# Patient Record
Sex: Female | Born: 1955
Health system: Southern US, Community
[De-identification: ages and names within clinical notes are randomized; demographics above are authoritative.]

## PROBLEM LIST (undated history)

## (undated) ENCOUNTER — Ambulatory Visit (HOSPITAL_BASED_OUTPATIENT_CLINIC_OR_DEPARTMENT_OTHER)

## (undated) DIAGNOSIS — T451X5A Adverse effect of antineoplastic and immunosuppressive drugs, initial encounter: Secondary | ICD-10-CM

## (undated) DIAGNOSIS — I639 Cerebral infarction, unspecified: Secondary | ICD-10-CM

## (undated) DIAGNOSIS — K759 Inflammatory liver disease, unspecified: Secondary | ICD-10-CM

## (undated) DIAGNOSIS — J45909 Unspecified asthma, uncomplicated: Secondary | ICD-10-CM

## (undated) DIAGNOSIS — Z87891 Personal history of nicotine dependence: Secondary | ICD-10-CM

## (undated) DIAGNOSIS — M1990 Unspecified osteoarthritis, unspecified site: Secondary | ICD-10-CM

## (undated) DIAGNOSIS — H3530 Unspecified macular degeneration: Secondary | ICD-10-CM

## (undated) DIAGNOSIS — G500 Trigeminal neuralgia: Secondary | ICD-10-CM

## (undated) DIAGNOSIS — M810 Age-related osteoporosis without current pathological fracture: Secondary | ICD-10-CM

## (undated) DIAGNOSIS — Z860100 Personal history of colon polyps, unspecified: Secondary | ICD-10-CM

## (undated) DIAGNOSIS — Z800 Family history of malignant neoplasm of digestive organs: Secondary | ICD-10-CM

## (undated) DIAGNOSIS — IMO0001 Reserved for inherently not codable concepts without codable children: Secondary | ICD-10-CM

## (undated) DIAGNOSIS — Z5189 Encounter for other specified aftercare: Secondary | ICD-10-CM

## (undated) DIAGNOSIS — Z85118 Personal history of other malignant neoplasm of bronchus and lung: Secondary | ICD-10-CM

## (undated) DIAGNOSIS — E559 Vitamin D deficiency, unspecified: Secondary | ICD-10-CM

## (undated) DIAGNOSIS — H269 Unspecified cataract: Secondary | ICD-10-CM

## (undated) DIAGNOSIS — C7801 Secondary malignant neoplasm of right lung: Secondary | ICD-10-CM

## (undated) DIAGNOSIS — J439 Emphysema, unspecified: Secondary | ICD-10-CM

## (undated) DIAGNOSIS — I739 Peripheral vascular disease, unspecified: Secondary | ICD-10-CM

## (undated) DIAGNOSIS — R1310 Dysphagia, unspecified: Secondary | ICD-10-CM

## (undated) DIAGNOSIS — J449 Chronic obstructive pulmonary disease, unspecified: Secondary | ICD-10-CM

## (undated) DIAGNOSIS — D649 Anemia, unspecified: Secondary | ICD-10-CM

## (undated) DIAGNOSIS — E785 Hyperlipidemia, unspecified: Secondary | ICD-10-CM

## (undated) DIAGNOSIS — K449 Diaphragmatic hernia without obstruction or gangrene: Secondary | ICD-10-CM

## (undated) DIAGNOSIS — T7840XA Allergy, unspecified, initial encounter: Secondary | ICD-10-CM

## (undated) DIAGNOSIS — J189 Pneumonia, unspecified organism: Secondary | ICD-10-CM

## (undated) DIAGNOSIS — K219 Gastro-esophageal reflux disease without esophagitis: Secondary | ICD-10-CM

## (undated) DIAGNOSIS — I10 Essential (primary) hypertension: Secondary | ICD-10-CM

## (undated) DIAGNOSIS — D61810 Antineoplastic chemotherapy induced pancytopenia: Secondary | ICD-10-CM

## (undated) DIAGNOSIS — K648 Other hemorrhoids: Secondary | ICD-10-CM

## (undated) HISTORY — DX: Personal history of colon polyps, unspecified: Z86.0100

## (undated) HISTORY — PX: ELBOW SURGERY: SHX618

## (undated) HISTORY — DX: Diaphragmatic hernia without obstruction or gangrene: K44.9

## (undated) HISTORY — DX: Emphysema, unspecified: J43.9

## (undated) HISTORY — DX: Cerebral infarction, unspecified: I63.9

## (undated) HISTORY — DX: Trigeminal neuralgia: G50.0

## (undated) HISTORY — DX: Other hemorrhoids: K64.8

## (undated) HISTORY — PX: APPENDECTOMY: SHX54

## (undated) HISTORY — PX: LUMBAR DISC SURGERY: SHX700

## (undated) HISTORY — DX: Age-related osteoporosis without current pathological fracture: M81.0

## (undated) HISTORY — DX: Gastro-esophageal reflux disease without esophagitis: K21.9

## (undated) HISTORY — PX: COLONOSCOPY: SHX174

## (undated) HISTORY — DX: Chronic obstructive pulmonary disease, unspecified: J44.9

## (undated) HISTORY — DX: Antineoplastic chemotherapy induced pancytopenia: D61.810

## (undated) HISTORY — DX: Dysphagia, unspecified: R13.10

## (undated) HISTORY — DX: Unspecified cataract: H26.9

## (undated) HISTORY — PX: OTHER SURGICAL HISTORY: SHX169

## (undated) HISTORY — PX: CRANIOTOMY: SHX93

## (undated) HISTORY — DX: Encounter for other specified aftercare: Z51.89

## (undated) HISTORY — DX: Unspecified asthma, uncomplicated: J45.909

## (undated) HISTORY — PX: CATARACT EXTRACTION, BILATERAL: SHX1313

## (undated) HISTORY — DX: Unspecified osteoarthritis, unspecified site: M19.90

## (undated) HISTORY — DX: Vitamin D deficiency, unspecified: E55.9

## (undated) HISTORY — DX: Anemia, unspecified: D64.9

## (undated) HISTORY — DX: Personal history of colonic polyps: Z86.010

## (undated) HISTORY — DX: Personal history of other malignant neoplasm of bronchus and lung: Z85.118

## (undated) HISTORY — DX: Antineoplastic chemotherapy induced pancytopenia: T45.1X5A

## (undated) HISTORY — DX: Peripheral vascular disease, unspecified: I73.9

## (undated) HISTORY — DX: Personal history of nicotine dependence: Z87.891

## (undated) HISTORY — DX: Secondary malignant neoplasm of right lung: C78.01

## (undated) HISTORY — DX: Hyperlipidemia, unspecified: E78.5

## (undated) HISTORY — PX: UPPER GASTROINTESTINAL ENDOSCOPY: SHX188

## (undated) HISTORY — PX: ABDOMINAL HYSTERECTOMY: SHX81

## (undated) HISTORY — PX: HAND SURGERY: SHX662

## (undated) HISTORY — DX: Family history of malignant neoplasm of digestive organs: Z80.0

## (undated) HISTORY — PX: WRIST SURGERY: SHX841

## (undated) HISTORY — DX: Allergy, unspecified, initial encounter: T78.40XA

## (undated) HISTORY — DX: Essential (primary) hypertension: I10

## (undated) HISTORY — PX: TOTAL HIP ARTHROPLASTY: SHX124

---

## 1998-05-27 ENCOUNTER — Emergency Department (HOSPITAL_COMMUNITY): Admission: EM | Admit: 1998-05-27 | Discharge: 1998-05-27 | Payer: Self-pay | Admitting: Emergency Medicine

## 1998-11-17 ENCOUNTER — Ambulatory Visit (HOSPITAL_COMMUNITY): Admission: RE | Admit: 1998-11-17 | Discharge: 1998-11-17 | Payer: Self-pay | Admitting: Orthopedic Surgery

## 1998-11-17 ENCOUNTER — Encounter: Payer: Self-pay | Admitting: Orthopedic Surgery

## 2000-02-11 ENCOUNTER — Ambulatory Visit (HOSPITAL_COMMUNITY): Admission: RE | Admit: 2000-02-11 | Discharge: 2000-02-11 | Payer: Self-pay | Admitting: Internal Medicine

## 2000-02-11 ENCOUNTER — Encounter: Payer: Self-pay | Admitting: Internal Medicine

## 2001-08-01 ENCOUNTER — Ambulatory Visit (HOSPITAL_COMMUNITY): Admission: RE | Admit: 2001-08-01 | Discharge: 2001-08-01 | Payer: Self-pay | Admitting: Neurosurgery

## 2001-08-01 ENCOUNTER — Encounter: Payer: Self-pay | Admitting: Neurosurgery

## 2003-11-28 ENCOUNTER — Ambulatory Visit (HOSPITAL_COMMUNITY): Admission: RE | Admit: 2003-11-28 | Discharge: 2003-11-28 | Payer: Self-pay | Admitting: Orthopedic Surgery

## 2004-01-15 ENCOUNTER — Ambulatory Visit (HOSPITAL_COMMUNITY): Admission: RE | Admit: 2004-01-15 | Discharge: 2004-01-15 | Payer: Self-pay | Admitting: Orthopedic Surgery

## 2004-04-03 ENCOUNTER — Encounter: Admission: RE | Admit: 2004-04-03 | Discharge: 2004-04-03 | Payer: Self-pay | Admitting: Internal Medicine

## 2005-06-30 ENCOUNTER — Ambulatory Visit (HOSPITAL_COMMUNITY): Admission: RE | Admit: 2005-06-30 | Discharge: 2005-06-30 | Payer: Self-pay | Admitting: Orthopedic Surgery

## 2005-12-15 ENCOUNTER — Inpatient Hospital Stay (HOSPITAL_COMMUNITY): Admission: RE | Admit: 2005-12-15 | Discharge: 2005-12-19 | Payer: Self-pay | Admitting: Orthopedic Surgery

## 2006-05-11 ENCOUNTER — Ambulatory Visit (HOSPITAL_COMMUNITY): Admission: RE | Admit: 2006-05-11 | Discharge: 2006-05-11 | Payer: Self-pay | Admitting: Internal Medicine

## 2007-12-05 ENCOUNTER — Inpatient Hospital Stay (HOSPITAL_COMMUNITY): Admission: EM | Admit: 2007-12-05 | Discharge: 2007-12-08 | Payer: Self-pay | Admitting: Emergency Medicine

## 2007-12-06 ENCOUNTER — Encounter (INDEPENDENT_AMBULATORY_CARE_PROVIDER_SITE_OTHER): Payer: Self-pay | Admitting: Internal Medicine

## 2008-01-23 ENCOUNTER — Ambulatory Visit: Payer: Self-pay | Admitting: Internal Medicine

## 2008-02-08 ENCOUNTER — Ambulatory Visit: Payer: Self-pay | Admitting: Internal Medicine

## 2008-02-08 ENCOUNTER — Encounter: Payer: Self-pay | Admitting: Internal Medicine

## 2008-03-20 ENCOUNTER — Ambulatory Visit: Payer: Self-pay | Admitting: Internal Medicine

## 2008-04-01 ENCOUNTER — Ambulatory Visit: Payer: Self-pay | Admitting: Internal Medicine

## 2008-04-04 ENCOUNTER — Telehealth: Payer: Self-pay | Admitting: Internal Medicine

## 2008-04-08 ENCOUNTER — Telehealth: Payer: Self-pay | Admitting: Internal Medicine

## 2008-04-10 ENCOUNTER — Ambulatory Visit: Payer: Self-pay | Admitting: Internal Medicine

## 2008-04-16 ENCOUNTER — Telehealth: Payer: Self-pay | Admitting: Internal Medicine

## 2008-04-30 DIAGNOSIS — E782 Mixed hyperlipidemia: Secondary | ICD-10-CM | POA: Insufficient documentation

## 2008-04-30 DIAGNOSIS — K648 Other hemorrhoids: Secondary | ICD-10-CM | POA: Insufficient documentation

## 2008-04-30 DIAGNOSIS — K259 Gastric ulcer, unspecified as acute or chronic, without hemorrhage or perforation: Secondary | ICD-10-CM | POA: Insufficient documentation

## 2008-04-30 DIAGNOSIS — K219 Gastro-esophageal reflux disease without esophagitis: Secondary | ICD-10-CM | POA: Insufficient documentation

## 2008-04-30 DIAGNOSIS — D126 Benign neoplasm of colon, unspecified: Secondary | ICD-10-CM | POA: Insufficient documentation

## 2008-04-30 DIAGNOSIS — K922 Gastrointestinal hemorrhage, unspecified: Secondary | ICD-10-CM | POA: Insufficient documentation

## 2008-05-01 ENCOUNTER — Ambulatory Visit: Payer: Self-pay | Admitting: Internal Medicine

## 2008-05-01 DIAGNOSIS — Z860100 Personal history of colon polyps, unspecified: Secondary | ICD-10-CM | POA: Insufficient documentation

## 2008-05-01 LAB — CONVERTED CEMR LAB
Albumin: 3.5 g/dL (ref 3.5–5.2)
BUN: 6 mg/dL (ref 6–23)
Bilirubin, Direct: 0.2 mg/dL (ref 0.0–0.3)
CO2: 27 meq/L (ref 19–32)
Chloride: 113 meq/L — ABNORMAL HIGH (ref 96–112)
Creatinine, Ser: 0.6 mg/dL (ref 0.4–1.2)
Eosinophils Absolute: 0.1 10*3/uL (ref 0.0–0.7)
Free T4: 0.9 ng/dL (ref 0.6–1.6)
GFR calc Af Amer: 136 mL/min
GFR calc non Af Amer: 112 mL/min
Glucose, Bld: 97 mg/dL (ref 70–99)
Hemoglobin: 13.5 g/dL (ref 12.0–15.0)
Lymphocytes Relative: 25.3 % (ref 12.0–46.0)
Monocytes Relative: 6.4 % (ref 3.0–12.0)
Neutro Abs: 6.1 10*3/uL (ref 1.4–7.7)
Platelets: 385 10*3/uL (ref 150–400)
RBC: 3.95 M/uL (ref 3.87–5.11)
Sodium: 144 meq/L (ref 135–145)
T3 Uptake Ratio: 41.3 % — ABNORMAL HIGH (ref 22.5–37.0)
T3, Free: 3.4 pg/mL (ref 2.3–4.2)
T4, Total: 6.8 ug/dL (ref 5.0–12.5)
TSH: 1.43 microintl units/mL (ref 0.35–5.50)
Tissue Transglutaminase Ab, IgA: 0.5 units (ref <7)

## 2008-05-07 ENCOUNTER — Encounter: Payer: Self-pay | Admitting: Internal Medicine

## 2008-05-07 LAB — CONVERTED CEMR LAB: 5-HIAA, 24 Hr Urine: 2.4 mg/(24.h) (ref <=6.0)

## 2008-06-04 ENCOUNTER — Ambulatory Visit: Payer: Self-pay | Admitting: Internal Medicine

## 2008-07-04 ENCOUNTER — Ambulatory Visit: Payer: Self-pay | Admitting: Internal Medicine

## 2008-07-05 ENCOUNTER — Ambulatory Visit (HOSPITAL_COMMUNITY): Admission: RE | Admit: 2008-07-05 | Discharge: 2008-07-05 | Payer: Self-pay | Admitting: Internal Medicine

## 2008-08-14 ENCOUNTER — Ambulatory Visit: Payer: Self-pay | Admitting: Internal Medicine

## 2008-10-14 ENCOUNTER — Ambulatory Visit: Payer: Self-pay | Admitting: Internal Medicine

## 2008-10-14 DIAGNOSIS — R1084 Generalized abdominal pain: Secondary | ICD-10-CM | POA: Insufficient documentation

## 2008-10-18 HISTORY — PX: ESOPHAGOGASTRODUODENOSCOPY: SHX1529

## 2008-10-24 ENCOUNTER — Ambulatory Visit: Payer: Self-pay | Admitting: Internal Medicine

## 2008-10-30 ENCOUNTER — Telehealth: Payer: Self-pay | Admitting: Internal Medicine

## 2008-11-29 ENCOUNTER — Ambulatory Visit: Payer: Self-pay | Admitting: Cardiology

## 2008-11-29 ENCOUNTER — Observation Stay (HOSPITAL_COMMUNITY): Admission: EM | Admit: 2008-11-29 | Discharge: 2008-12-02 | Payer: Self-pay | Admitting: Emergency Medicine

## 2008-12-02 ENCOUNTER — Encounter (INDEPENDENT_AMBULATORY_CARE_PROVIDER_SITE_OTHER): Payer: Self-pay | Admitting: Internal Medicine

## 2008-12-10 ENCOUNTER — Telehealth: Payer: Self-pay | Admitting: Internal Medicine

## 2008-12-12 ENCOUNTER — Encounter: Admission: RE | Admit: 2008-12-12 | Discharge: 2008-12-12 | Payer: Self-pay | Admitting: Internal Medicine

## 2008-12-13 ENCOUNTER — Ambulatory Visit: Payer: Self-pay | Admitting: Internal Medicine

## 2009-10-10 ENCOUNTER — Emergency Department (HOSPITAL_COMMUNITY): Admission: EM | Admit: 2009-10-10 | Discharge: 2009-10-10 | Payer: Self-pay | Admitting: Emergency Medicine

## 2009-10-16 ENCOUNTER — Emergency Department (HOSPITAL_BASED_OUTPATIENT_CLINIC_OR_DEPARTMENT_OTHER): Admission: EM | Admit: 2009-10-16 | Discharge: 2009-10-16 | Payer: Self-pay | Admitting: Emergency Medicine

## 2009-11-11 ENCOUNTER — Ambulatory Visit (HOSPITAL_COMMUNITY): Admission: RE | Admit: 2009-11-11 | Discharge: 2009-11-11 | Payer: Self-pay | Admitting: Internal Medicine

## 2010-03-12 ENCOUNTER — Telehealth: Payer: Self-pay | Admitting: Internal Medicine

## 2010-04-21 ENCOUNTER — Encounter (INDEPENDENT_AMBULATORY_CARE_PROVIDER_SITE_OTHER): Payer: Self-pay | Admitting: *Deleted

## 2010-04-22 ENCOUNTER — Ambulatory Visit: Payer: Self-pay | Admitting: Internal Medicine

## 2010-05-04 ENCOUNTER — Ambulatory Visit: Payer: Self-pay | Admitting: Internal Medicine

## 2010-05-31 ENCOUNTER — Emergency Department (HOSPITAL_COMMUNITY): Admission: EM | Admit: 2010-05-31 | Discharge: 2010-05-31 | Payer: Self-pay | Admitting: Emergency Medicine

## 2010-11-08 ENCOUNTER — Encounter: Payer: Self-pay | Admitting: Internal Medicine

## 2010-11-19 NOTE — Miscellaneous (Signed)
  Clinical Lists Changes  Orders: Added new Test order of TLB-H. Pylori Abs(Helicobacter Pylori) ([id number]-[id number]) - Signed Added new Test order of T- * Misc. Laboratory test [id number]) - Signed

## 2010-11-19 NOTE — Miscellaneous (Signed)
Summary: [facility] Pervisit/prep  Clinical Lists Changes  Observations: Added new observation of ALLERGY REV: Done (04/22/2010 10:42)

## 2010-11-19 NOTE — Letter (Signed)
Summary: EGD Instructions  [facility]  [address] [place], [place] [address]   Phone: [phone]  Fax: [phone]       [name]    05/14/1956    MRN: [id number]       Procedure Day [name]:  [facility]  05/04/10     Arrival Time:   10:30AM     Procedure Time:  11:30AM     Location of Procedure:                    _ X _ [facility] (4th Floor)   PREPARATION FOR ENDOSCOPY   On  05/04/10 THE DAY OF THE PROCEDURE:  1.   No solid foods, milk or milk products are allowed after midnight the night before your procedure.  2.   Do not drink anything colored red or purple.  Avoid juices with pulp.  No orange juice.  3.  You may drink clear liquids until 9:30AM, which is 2 hours before your procedure.                                                                                                CLEAR LIQUIDS INCLUDE: Water Jello Ice Popsicles Tea (sugar ok, no milk/cream) Powdered fruit flavored drinks Coffee (sugar ok, no milk/cream) Gatorade Juice: apple, white grape, white cranberry  Lemonade Clear bullion, consomm, broth Carbonated beverages (any kind) Strained chicken noodle soup Hard Candy   MEDICATION INSTRUCTIONS  Unless otherwise instructed, you should take regular prescription medications with a small sip of water as early as possible the morning of your procedure.        OTHER INSTRUCTIONS  You will need a responsible adult at least 53 years of age to accompany you and drive you home.   This person must remain in the waiting room during your procedure.  Wear loose fitting clothing that is easily removed.  Leave jewelry and other valuables at home.  However, you may wish to bring a book to read or an iPod/MP3 player to listen to music as you wait for your procedure to start.  Remove all body piercing jewelry and leave at home.  Total time from sign-in until discharge is approximately 2-3 hours.  You should go home  directly after your procedure and rest.  You can resume normal activities the day after your procedure.  The day of your procedure you should not:   Drive   Make legal decisions   Operate machinery   Drink alcohol   Return to work  You will receive specific instructions about eating, activities and medications before you leave.    The above instructions have been reviewed and explained to me by   Wyona Almas RN  April 22, 2010 11:09 AM     I fully understand and can verbalize these instructions _____________________________ Date _________

## 2010-11-19 NOTE — Progress Notes (Signed)
Summary: Schedule EGD   Phone Note Outgoing Call Call back at [facility] Phone [phone]   Call placed by: Harlow Mares CMA Duncan Dull),  Mar 12, 2010 9:56 AM Call placed to: Patient Summary of Call: Left message on patients machine to call back, patient needs recall egd she cxed egd last year.  Initial call taken by: Harlow Mares CMA Duncan Dull),  Mar 12, 2010 9:56 AM  Follow-up for Phone Call        Patient is scheduled for 7-18 for egd and 7-6 for pv Follow-up by: Harlow Mares CMA Duncan Dull),  Mar 12, 2010 11:44 AM

## 2010-11-19 NOTE — Miscellaneous (Signed)
Summary: Orders Update  Clinical Lists Changes  Orders: Added new Test order of TLB-H Pylori Screen Gastric Biopsy ([id number]) - Signed 

## 2010-11-19 NOTE — Procedures (Signed)
Summary: Upper Endoscopy  Patient: [name] Note: All result statuses are Final unless otherwise noted.  Tests: (1) Upper Endoscopy (EGD)   EGD Upper Endoscopy       DONE     [facility]     [address]     [place], [place]  [address]           ENDOSCOPY PROCEDURE REPORT           PATIENT:  [name], [name]  MR#:  [id number]     BIRTHDATE:  [birth year], 54 yrs. old  GENDER:  female           ENDOSCOPIST:  [name]. Eda Keys, MD     Referred by:  Recall - F/U EGD           PROCEDURE DATE:  05/04/2010     PROCEDURE:  EGD with biopsy     ASA CLASS:  Class II     INDICATIONS:  follow-up of gastric ulcers (multiple antral) and     bulbar - felt secondary to NSAIDS.  has been on PPI. Last exam     10-2008. Delayed followup due to other medical problems           MEDICATIONS:   Fentanyl 50 mcg IV, Versed 5 mg IV     TOPICAL ANESTHETIC:  Exactacain Spray           DESCRIPTION OF PROCEDURE:   After the risks benefits and     alternatives of the procedure were thoroughly explained, informed     consent was obtained.  The [facility] GIF-H180 [id number] endoscope was     introduced through the mouth and advanced to the third portion of     the duodenum, without limitations.  The instrument was slowly     withdrawn as the mucosa was fully examined.     <<PROCEDUREIMAGES>>           The esophagus and gastroesophageal junction were completely normal     in appearance.  The stomach was entered and closely examined. The     antrum, angularis, and lesser curvature were well visualized,     including a retroflexed view of the cardia and fundus. The stomach     wall was normally distensable.Previous antral ulcers were healed     with some residual scarring present. The scope passed easily     through the pylorus into the duodenum.  A 10mm ulcer was found in     the bulb of the duodenum. The postbulbar duodenum was normal.     Retroflexed views revealed no abnormalities. CLO BX from  antrum     taken.   The scope was then withdrawn from the patient and the     procedure completed.           COMPLICATIONS:  None           ENDOSCOPIC IMPRESSION:     1) Normal esophagus     2) Normal stomach with healing of previous gastric ulcers     3) Ulcer in the bulb of duodenum           RECOMMENDATIONS:     1) Increase Prilosec to 40mg  TWICE daily     2) Rx CLO if positive     3) Go to lab for salicylate level and H. pylori antibody           ______________________________     [name]. Eda Keys, MD  CC:  [name], MD, The Patient           n.     eSIGNED:   [name]. Eda Keys at 05/04/2010 10:18 AM           [place], [place], [id number]  Note: An exclamation mark (!) indicates a result that was not dispersed into the flowsheet. Document Creation Date: 05/04/2010 10:19 AM _______________________________________________________________________  (1) Order result status: Final Collection or observation date-time: 05/04/2010 10:04 Requested date-time:  Receipt date-time:  Reported date-time:  Referring Physician:   Ordering Physician: Fransico Setters [phone]) Specimen Source:  Source: Launa Grill Order Number: [id number] Lab site:

## 2011-01-18 LAB — BASIC METABOLIC PANEL
CO2: 20 mEq/L (ref 19–32)
Calcium: 9.4 mg/dL (ref 8.4–10.5)
Chloride: 105 mEq/L (ref 96–112)
GFR calc Af Amer: >60 mL/min (ref >60)
Glucose, Bld: 100 mg/dL — ABNORMAL HIGH (ref 70–99)
Potassium: 3.2 mEq/L — ABNORMAL LOW (ref 3.5–5.1)
Sodium: 142 mEq/L (ref 135–145)

## 2011-01-18 LAB — DIFFERENTIAL
Basophils Relative: 0 % (ref 0–1)
Eosinophils Relative: 0 % (ref 0–5)
Monocytes Absolute: 0.3 10*3/uL (ref 0.1–1.0)
Monocytes Relative: 6 % (ref 3–12)
Neutro Abs: 3.7 10*3/uL (ref 1.7–7.7)

## 2011-01-18 LAB — CBC
HCT: 40 % (ref 36.0–46.0)
Hemoglobin: 13 g/dL (ref 12.0–15.0)
MCHC: 32.6 g/dL (ref 30.0–36.0)
MCV: 92.6 fL (ref 78.0–100.0)
RBC: 4.32 MIL/uL (ref 3.87–5.11)
RDW: 14.2 % (ref 11.5–15.5)

## 2011-02-02 LAB — DIFFERENTIAL
Lymphocytes Relative: 43 % (ref 12–46)
Lymphs Abs: 3.1 10*3/uL (ref 0.7–4.0)
Monocytes Absolute: 0.6 10*3/uL (ref 0.1–1.0)
Monocytes Relative: 9 % (ref 3–12)
Neutro Abs: 3.1 10*3/uL (ref 1.7–7.7)
Neutrophils Relative %: 42 % — ABNORMAL LOW (ref 43–77)

## 2011-02-02 LAB — CARDIAC PANEL(CRET KIN+CKTOT+MB+TROPI)
CK, MB: 0.7 ng/mL (ref 0.3–4.0)
CK, MB: 0.7 ng/mL (ref 0.3–4.0)
Relative Index: INVALID (ref 0.0–2.5)
Total CK: 34 U/L (ref 7–177)
Troponin I: <0.01 ng/mL (ref 0.00–0.06)
Troponin I: <0.01 ng/mL (ref 0.00–0.06)

## 2011-02-02 LAB — CK TOTAL AND CKMB (NOT AT ARMC)
CK, MB: 0.7 ng/mL (ref 0.3–4.0)
Relative Index: INVALID (ref 0.0–2.5)
Total CK: 35 U/L (ref 7–177)

## 2011-02-02 LAB — CBC
Hemoglobin: 12.2 g/dL (ref 12.0–15.0)
MCHC: 34.5 g/dL (ref 30.0–36.0)
Platelets: 372 10*3/uL (ref 150–400)
RDW: 14.9 % (ref 11.5–15.5)

## 2011-02-02 LAB — COMPREHENSIVE METABOLIC PANEL
Albumin: 3.9 g/dL (ref 3.5–5.2)
BUN: 12 mg/dL (ref 6–23)
Calcium: 9.5 mg/dL (ref 8.4–10.5)
Glucose, Bld: 93 mg/dL (ref 70–99)
Potassium: 4.5 mEq/L (ref 3.5–5.1)
Sodium: 140 mEq/L (ref 135–145)
Total Protein: 7 g/dL (ref 6.0–8.3)

## 2011-02-02 LAB — POCT CARDIAC MARKERS
CKMB, poc: <1 ng/mL — ABNORMAL LOW (ref 1.0–8.0)
Myoglobin, poc: 19.7 ng/mL (ref 12–200)
Troponin i, poc: <0.05 ng/mL (ref 0.00–0.09)

## 2011-03-02 NOTE — H&P (Signed)
[name], [name]             ACCOUNT NO.:  [id number]   MEDICAL RECORD NO.:  [id number]          PATIENT TYPE:  INP   LOCATION:  3729                         FACILITY:  [facility]   PHYSICIAN:  Della Goo, M.D. DATE OF BIRTH:  [birth year]   DATE OF ADMISSION:  12/05/2007  DATE OF DISCHARGE:                              HISTORY & PHYSICAL   PRIMARY CARE PHYSICIAN:  Unassigned.   CHIEF COMPLAINT:  Chest pain.   HISTORY OF PRESENT ILLNESS:  This is a 55 year old female presenting to  the emergency department with complaints of having chest pain that has  been intermittent for the past 2 days.  She describes it as being  substernal and left sided chest pain.  She denies any radiation of the  pain.  She does report having some lightheadedness associated with this  pain.  She is a smoker and does have a family history.  Mother had  coronary artery disease.  The patient also has a history of  hyperlipidemia and is on cholesterol medication.  She denies any having  any fevers, chills, cough.  She denies having any nausea, vomiting,  diaphoresis, diarrhea, constipation, or syncope.   PAST MEDICAL HISTORY:  1. Hyperlipidemia.  2. Tobacco abuse.   MEDICATIONS:  1. Zantac 150 mg one p.o. b.i.d.  2. Vytorin 10/10 one tablet p.o. daily.   ALLERGIES:  No known drug allergies.   SOCIAL HISTORY:  The patient is married, smoker.  She smokes one pack  per day and rare alcohol usage.   FAMILY HISTORY:  As mentioned above.   REVIEW OF SYSTEMS:  Pertinent as mentioned above.   PHYSICAL EXAMINATION:  GENERAL:  This is a 55 year old, thin, well  developed female in no acute distress.  VITAL SIGNS:  Stable.  She is afebrile.  HEENT:  Normocephalic atraumatic.  Pupils equally round, reactive to  light.  Extraocular muscles are intact.  Funduscopic benign.  Oropharynx  is clear.  NECK:  Supple.  Full range of motion.  No thyromegaly, adenopathy,  jugular venous distention.  CARDIOVASCULAR:  Regular rate and rhythm.  No murmurs, gallops, or rubs.  LUNGS:  Clear to auscultation bilaterally.  ABDOMEN:  Positive bowel sounds.  Soft, nontender, nondistended.  EXTREMITIES:  Without clubbing, cyanosis, or edema.  NEUROLOGIC:  Nonfocal.   LABORATORY STUDIES:  Reveal initial I-STAT cardiac enzymes are negative.  EKG findings with a normal sinus rhythm without acute ST segment  changes.   ASSESSMENT:  3. A 55 year old female being admitted with chest pain.  2. Hyperlipidemia.  3. Tobacco abuse.  4. Gastroesophageal reflux disease.   PLAN:  1. The patient will be admitted for further evaluation and cardiac      monitoring.  2. Cardiac enzymes will be continued.  3. The patient will be placed on nitrates, oxygen, and aspirin      therapy.  Low-dose beta-blocker therapy will also be started.  4. The patient will also continue on her regular medications.      Della Goo, M.D.  Electronically Signed     HJ/MEDQ  D:  12/06/2007  T:  12/07/2007  Job:  [id number]

## 2011-03-02 NOTE — H&P (Signed)
[name], [name]             ACCOUNT NO.:  [id number]   MEDICAL RECORD NO.:  [id number]          PATIENT TYPE:  INP   LOCATION:  1823                         FACILITY:  [facility]   PHYSICIAN:  Eduard Clos, MDDATE OF BIRTH:  14-May-1956   DATE OF ADMISSION:  11/29/2008  DATE OF DISCHARGE:                              HISTORY & PHYSICAL   PRIORITY ADMISSION HISTORY AND PHYSICAL   PRIMARY CARE PHYSICIAN:  [name], MD.   CHIEF COMPLAINT:  Palpitations and left facial pain.   HISTORY OF PRESENTING ILLNESS:  A 55 year old female with a history of  GERD, cigarette smoking, quit 3 weeks ago, presented to the ER after the  patient's primary care referred her as the patient has been having  intense, left-sided facial pain over the last 1 month.  It comes  episodic and lasts around a half an hour.  Along with the pain, she gets  palpitations and feels dizzy as if she is going to faint, but she has  not passed out.   The patient had gone to her primary care physician 2 days ago where she  was given some antibiotics and steroids.  Established increased  fluidsThe patient denies any chest pain, denies any fever or chills,  denies any nausea, vomiting, diarrhea, or abdominal pain, but she  sometimes gets short of breath when she gets palpitations.  Presently,  patient is asymptomatic and is being admitted for further observation.  The patient denies any loss of consciousness or weakness of the limbs.  Patient only had a CAT scan of the head, which was negative.   PAST MEDICAL HISTORY:  GERD.   PAST SURGICAL HISTORY:  1. Hysterectomy.  2. Low back surgery.   MEDICATION PRIOR TO ADMISSION:  The patient takes:  1. Prilosec.  2. Chantix 1 mg p.o. b.i.d.  3. Two days ago was started on azithromycin and prednisone.   ALLERGIES:  PENICILLIN.   SOCIAL HISTORY:  The patient smokes cigarettes, has been quit the last 3  weeks now.  Denies any alcohol or drug abuse.   FAMILY  HISTORY:  Nothing contributory, though coronary artery disease is  significant in the family.   REVIEW OF SYSTEMS:  As present in the History of Presenting Illness,  nothing else significant.   PHYSICAL EXAMINATION:  GENERAL:  The patient examined at the bedside not  in acute distress.  VITAL SIGNS:  Blood pressure is 103/60, pulse 84 per minute, temperature  97.9, respirations 18 per minute, O2 SAT 98%.  HEENT:  There is no facial asymmetry, PERRLA positive, tongue is  midline.  NECK:  No neck rigidity.  CHEST:  Bilateral air entry present, no rhonchi, no crepitation.  HEART:  S1 S2 heard.  ABDOMEN:  Soft and nontender, bowel sounds heard.  CNS:  Alert, awake, oriented to time, place, and person, moves upper and  lower extremities 5/5.  EXTREMITIES:  Peripheral pulses felt, no edema.   LABORATORY DATA:  EKG:  Normal sinus rhythm with nonspecific T-wave  changes.  Chest x-ray:  Nothing acute and just significant for  emphysematous changes.  CT head without  contrast shows generalized  sinusitis, which could be acute on chronic, no other significant  abnormality.  CBC:  WBC 12.3, hemoglobin 12.2, hematocrit 35.4,  platelets 672.  Complete metabolic panel:  Sodium 140, potassium 4.6,  chloride 107, carbon dioxide 22, glucose 93, BUN 12, creatinine 0.6.  Total bilirubin 0.2, alkaline-phosphatase 71, AST 18, ALT 14, total  protein 7, calcium 9.5.  CK-MB less than 1, troponin-I less than 0.05.   ASSESSMENT:  1. Near syncope, rule out cardiac arrhythmia.  2. Possible trigeminal neuralgia.  3. Sinusitis.  4. History of gastroesophageal reflux disease.  5. History of cigarette smoking, quit 3 weeks ago.   PLAN:  We will admit the patient to telemetry.  Cycle cardiac markers.  We will get an MRI and MRA of the brain.  We will observe telemetry to  rule out any arrhythmia.  We will get a Neurology consult in the a.m.  for the possibility of a trigeminal neuralgia.  Follow sed  rates.      Eduard Clos, MD  Electronically Signed     ANK/MEDQ  D:  11/29/2008  T:  11/29/2008  Job:  [id number]   cc:   [name], M.D.

## 2011-03-02 NOTE — Consult Note (Signed)
[name], [name]             ACCOUNT NO.:  [id number]   MEDICAL RECORD NO.:  [id number]          PATIENT TYPE:  INP   LOCATION:  3731                         FACILITY:  [facility]   PHYSICIAN:  Noel Christmas, MD    DATE OF BIRTH:  [birth year]   DATE OF CONSULTATION:  DATE OF DISCHARGE:                                 CONSULTATION   REFERRING PHYSICIAN:  IN [facility] team A.   REASON FOR CONSULTATION:  1. Probable trigeminal neuralgia.  2. Near syncopal spells.   HISTORY OF PRESENT ILLNESS:  This is a 55 year old lady who has been  experiencing recurrent severe left acute facial pain for about 1 month.  Pain occurs in paroxysms about 2-3 times per day.  She is not aware of  any particular triggers.  Pain starts in the left maxillary area, then  shoots to her jaw and left temporal regions.  Pain lasts up to 20  minutes.  She gets lightheaded and feels as if she is going to pass out  if she does not sit or lie down.  Between episodes of pain, her face  feels completely normal.  She had an MRI following admission which  showed no abnormality involving the left pontine region nor  cerebellopontine angle area.  Study did show T2 hypodensities in the  subcortical regions of the cerebrum bilaterally, more than expected for  her age.  She has a history of hypertension but no indications  clinically of multiple sclerosis.  There has also been no history of  vasculitis.  Sedimentation rate on admission was 20.  Her episodes of  lightheadedness and near syncope are associated only with bouts of  severe pain.   PAST MEDICAL HISTORY:  Remarkable for GERD, low back pain with previous  surgery, and tobacco abuse.   CURRENT MEDICATIONS:  1. Protonix 40 mg per day.  2. Lovenox 40 mg subcutaneously nightly.  3. Chantix 1 mg b.i.d.  4. Avelox 400 mg per day.  5. Oxycodone 5 mg q.4 h. p.r.n.   FAMILY HISTORY:  Noncontributory.   PHYSICAL EXAMINATION:  GENERAL:  Appearance was that of slender,  middle-  aged lady, who was alert and cooperative, and in no acute distress.  She  was well oriented to time as well as place.  Short-term and long-term  memory were normal.  Affect was appropriate.  HEENT:  Pupils were equal and reactive normally to light.  Extraocular  movements were full and conjugate.  Visual fields were intact and  normal.  There was no facial numbness.  No facial weakness.  Hearing was  normal.  Speech and palatal movements were normal.  NEUROLOGIC:  Strength and muscle tone were normal throughout.  Deep  tendon reflexes were normal and symmetrical throughout.  Plantar  responses were flexor.  Sensory examination was normal.  Carotid  auscultation was normal.   CLINICAL IMPRESSION:  1. New onset left trigeminal neuralgia with maxillary involvement      primarily and secondary spread to mandibular region.  2. Near syncopal spells associated with excruciating pain.  These are      probably vasovagal  reactions to pain.  3. Significance of T2 hypodensities on MRI involving the subcortical      region is unclear.  The linear nature of her lesion is suggestive      of bilateral middle cerebral artery/anterior cerebral artery      watershed ischemia.   RECOMMENDATIONS:  1. Trial of Neurontin 100 mg p.o. q.8 h. as starting dose.  2. ANA, although sedimentation rate was 20.  3. We will defer LP for oligoclonal band as likelihood of MS is very      low.  4. Carotid Doppler study to rule out carotid artery stenosis.  5. Echocardiogram is planned.   Thank you for asking me to evaluate [name].      Noel Christmas, MD  Electronically Signed     CS/MEDQ  D:  11/30/2008  T:  12/01/2008  Job:  [id number]

## 2011-03-02 NOTE — Assessment & Plan Note (Signed)
[facility]                         GASTROENTEROLOGY OFFICE NOTE   [name], [name]                    MRN:          [id number]  DATE:01/23/2008                            DOB:          [birth year]    The patient is self-referred.   CHIEF COMPLAINT:  Abdominal pain and nausea.   HISTORY:  This is a 55 year old white female who presents herself today  for evaluation of multiple GI complaints. Her mother, [name], is a  patient. The patient reports to me that she began to develop problems  with left-sided mid abdominal pain and right-sided mid abdominal pain  since November 2008. She states that when she eats small amounts of food  it makes her feel full. As well, food seems to exacerbate her symptoms.  She does notice that milk seems to help her symptoms. She has some  intermittent loose stools. Defecation does not affect symptoms. She  mentions that she will eat, feel nauseated and full. She describes  epigastric burning as well. Sometimes the pain tends to radiate to the  back. Her weight has fluctuated, initially down and now recently she has  been able to gain a few pounds. She also notices chronic problems with  heartburn and indigestion. These symptoms are relieve with proton pump  inhibitors. Off proton pump inhibitors, significant problems. No  dysphagia. She denies vomiting, melena, hematochezia, fevers, rash, or  joint aches. She was hospitalized in February with chest and abdominal  pain. At that time, CT scan of the abdomen and pelvis revealed no acute  abnormalities. Question of constipation was made. She was given  magnesium citrate which she says might have helped some. Laboratories at  that time were essentially unremarkable with a normal admission, CBC and  comprehensive metabolic panel. She has not had additional evaluation or  treatments. No prior history of GI problems.   ALLERGIES:  PENICILLIN.   CURRENT  MEDICATIONS:  1. Pantoprazole 40 mg daily.  2. Vitamin D b.i.d.   PAST MEDICAL HISTORY:  1. Hyperlipidemia.  2. Osteoarthritis.  3. Status post hysterectomy with bilateral salpingo-oophorectomy.  4. Status post appendectomy.  5. Status post right hip replacement.  6. Status post lumbar diskectomy.  7. Status post right elbow surgery.   SOCIAL HISTORY:  The patient is married with one daughter. She lives  with her husband. Has an 11th grade education. Is disabled due to her  hip problems. She quit smoking in February. Does not use alcohol.   REVIEW OF SYSTEMS:  Fatigue, shortness of breath, muscle cramps, back  pain, and joint pain per diagnostic evaluation form. Otherwise, negative  review of systems.   PHYSICAL EXAMINATION:  GENERAL:  Somewhat unhealthy-appearing female who  looks older than her stated age.  VITAL SIGNS:  Her blood pressure is 100/60, heart rate 60, weight 127.2  pounds. She is 5 feet, 2 inches in height. Respirations are 18 and  unlabored.  HEENT:  Sclerae anicteric. Conjunctivae are pink. Oral mucosa is intact.  There is no adenopathy. Thyroid is normal.  LUNGS:  Clear to auscultation.  HEART:  Regular without murmur, rub, or  gallop.  ABDOMEN:  Soft without tenderness, masses, or hernia. Good bowel sounds  heard. No organomegaly.  RECTAL:  Hemoccult negative stool.  EXTREMITIES:  Without edema.  NEUROLOGIC:  Cranial nerves are intact. No focal deficit.   IMPRESSION:  1. Chronic gastroesophageal reflux disease with symptoms improved on      proton pump inhibitor.  2. Recent problems with abdominal pain. Evidence of constipation on      CT. Otherwise, negative. I do wonder, given the post-prandial      component, if she may have intestinal angina.   RECOMMENDATIONS:  1. Colonoscopy to evaluate pain, changes in bowel habits, as well as      provide neoplasia screening. The nature of the procedure as well as      the risks, benefits, and alternatives have  been reviewed. She      understood and agreed to proceed.  2. Upper endoscopy to evaluate pain, chronic reflux, and Barrett's      screening. The nature of the procedure as well as the risks,      benefits, and alternatives have been reviewed. She understood and      agreed to proceed.  3. Continue proton pump inhibitor therapy.  4. If endoscopic evaluations are negative, then consider MRA of the      abdomen to exclude mesenteric vascular disease.     [name]. Marina Goodell, MD  Electronically Signed    JNP/MedQ  DD: 01/23/2008  DT: 01/23/2008  Job #: [id number]   cc:   [name], M.D.

## 2011-03-02 NOTE — Discharge Summary (Signed)
[name], [name]             ACCOUNT NO.:  [id number]   MEDICAL RECORD NO.:  [id number]          PATIENT TYPE:  INP   LOCATION:  3731                         FACILITY:  [facility]   PHYSICIAN:  Altha Harm, MDDATE OF BIRTH:  05/03/1956   DATE OF ADMISSION:  11/29/2008  DATE OF DISCHARGE:  12/02/2008                               DISCHARGE SUMMARY   DISCHARGE DISPOSITION:  Home.   FINAL DISCHARGE DIAGNOSES:  1. Trigeminal neuralgia.  2. Vasovagal syncope associated with trigeminal neuralgic pain.  3. Headaches.  4. Near syncope.  5. Tobacco use disorder.  6. Gastroesophageal reflux disease.  7. Status post hysterectomy.  8. Status post low back surgery.  9. History of emphysema.   DISCHARGE MEDICATIONS:  1. Fioricet 1 tab p.o. q.6 h p.r.n. pain.  2. Carbamazepine 300 mg p.o. b.i.d. x3 days then 200 mg p.o. t.i.d.      indefinitely.  3. Prilosec 40 mg p.o. b.i.d.  4. Chantix 1 mg p.o. b.i.d.   CONSULTANTS:  Neurology.   PROCEDURES:  None.   DIAGNOSTIC STUDIES:  Include the following:  1. Portable chest x-ray on admission that shows no acute findings in      her chest.  Findings consistent with emphysema.  2. CT of the head without contrast which shows sphenoid sinusitis      which could be acute or acute, no evidence of infarct.  3. An MRA of the brain shows multiple tiny subcortical T2      hyperintensities greater in number than expected for age.  Findings      nonspecific.  No evidence of acute infarct.  Focal T2 signal in      brain stem on the right which is nonspecific.  4. Chronic left sphenoid sinus disease.  5. MRA of the head which showed normal variant MRA circle of Willis.  6. Two-D echocardiogram which shows an ejection fraction of 60%, no      regional wall motion abnormalities, otherwise normal      echocardiogram.  7. Carotid duplex which shows no ICA stenosis.   CODE STATUS:  Full Code.   ALLERGIES:  PENICILLINS.   PRIMARY CARE PHYSICIAN:   [name], MD   CHIEF COMPLAINT:  Palpitations and left facial pain.   HISTORY OF PRESENT ILLNESS:  Please refer to the history and physical by  Dr. Toniann Fail for details of the HPI.   HOSPITAL COURSE:  The patient was admitted with the suspicion of  trigeminal neuralgia.  The patient received a workup with studies  showing the above.  The patient was started on Neurontin and a neurology  consult was obtained.  The neurologist transitioned the patient over to  carbamazepine for better control of both the neuralgia and her headaches  associated with this.  The patient did have an episode of near syncope  associated with the pain and it is felt that this was a vasovagal  response to the pain as the workup was essentially normal.  The patient  is to continue on the above-stated medications as noted.  The patient  was started on Avelox for treatment  of an acute sinusitis; however, the  MRI revealed findings consistent with a chronic sinusitis.  The patient  will be discharged on Avelox for a total of 10 days for treatment of a  chronic sinusitis.   Headache.  It was felt that the headache was associated with the  trigeminal neuralgia and the patient is on carbamazepine, as well she is  given Fioricet for p.r.n. headache.   Tobacco use disorder.  The patient has been on Chantix for approximately  3 weeks and is to continue as previously directed by her primary care  physician.   The patient is otherwise stable and is at her baseline.  Today she is  afebrile.  Vital signs as follows:  Temperature 97.8, heart rate ranging  between 92 and 101 with activity, respiratory 18, blood pressure 109/67,  O2 saturations are 96% on room air.  The patient is ambulatory without  any difficulties.  She is requiring no oxygen, she is at her baseline.   FOLLOWUP:  The patient is to follow up with her primary care physician,  Dr. Oneta Rack, as needed.   FOLLOWUP LABS:  None.   Please note that  the patient had a normal CBC and basic metabolic  profile while hospitalized.   DIETARY RESTRICTIONS:  None.   PHYSICAL RESTRICTIONS:  None.   Time spent in discharge process 37 mins      Altha Harm, MD  Electronically Signed    MAM/MEDQ  D:  12/02/2008  T:  12/02/2008  Job:  [id number]   cc:   [name], M.D.

## 2011-03-02 NOTE — Discharge Summary (Signed)
NAMEDARNELLE, DEBONA             ACCOUNT NO.:  [id number]   MEDICAL RECORD NO.:  [id number]          PATIENT TYPE:  INP   LOCATION:  3729                         FACILITY:  [facility]   PHYSICIAN:  Thomasenia Bottoms, MDDATE OF BIRTH:  [birth year]   DATE OF ADMISSION:  12/05/2007  DATE OF DISCHARGE:  12/08/2007                               DISCHARGE SUMMARY   SUMMARY OF HOSPITAL COURSE:  1. Ms. Roughton is a 55 year old woman who was admitted with a diagnosis      of chest pain.  She was also having significant trouble with      palpitations and feeling dizzy.  Because of the constellation of      her symptoms, cardiology was consulted.  The patient did rule out      for MI by cardiac enzymes.  She did not have any significant      arrhythmias on the telemetry monitor.  She had a little bit of mild      sinus tachycardia on arrival.  She did have a Persantine Cardiolite      which was negative for any signs of ischemia.  The patient did have      some chest pain during the exam but this stress test was negative,      so it is felt that her palpitations and chest pains are not      cardiac.  2. Left lower quadrant abdominal pain, nausea, intermittent diarrhea      for approximately 1 month prior to arrival.  The patient continued      to have significant abdominal pains while she was here in the      hospital, so a CT scan of the abdomen and pelvis was done which      revealed a significant of stool in the colon consistent with      constipation.  No other acute findings related to the abdomen were      identified.  It is felt that this month of discomfort and diarrhea      may be what led to the patient's palpitations.  The plan is to use      magnesium citrate which the patient has used in the past for      relief.  She will follow up with her primary care physician for the      possibility of IBS with constipation.  3. Mild shortness of breath.  We suspect this is because of  smoking.      She was counseled to quit.  4. Tobacco abuse.  5. Hyperlipidemia.  The patient had significant leg pain with Vytorin      and is currently taking an over-the-counter medication for this.  6. Gastroesophageal reflux disease.  7. History of hip replacement.   SUMMARY OF STUDIES DONE DURING HOSPITAL STAY:  Include a CT scan of the  abdomen and pelvis which revealed the moderate amount of stool in colon.  It also revealed a tiny right hemangioma and a tiny lingular nodule 2 mm  in size.  A follow-up CT in 1 year is recommended.  She  also had a  negative Persantine Cardiolite stress test.  Of note, her white blood  cell count remained normal at 6.6, hemoglobin 11.7, platelet count 271.  Blood pressures were actually a little low during this hospital stay.  She was put on nitroglycerin patch which had to be stopped because of  the lower blood pressures and a small dose of Lopressor because of the  palpitations.  Given the fact that we think this may be because of her  abdominal discomfort she is not going to be discharged on Lopressor, but  should she continue to have the palpitations this should be restarted as  an outpatient.     Thomasenia Bottoms, MD  Electronically Signed    CVC/MEDQ  D:  12/08/2007  T:  12/09/2007  Job:  [phone] [id number]   cc:   [name], M.D.

## 2011-03-05 NOTE — Op Note (Signed)
Brenda, Dougherty             ACCOUNT NO.:  [id number]   MEDICAL RECORD NO.:  [id number]          PATIENT TYPE:  INP   LOCATION:  1517                         [facility]ACILITY:  [facility]   PHYSICIAN:  Ollen Gross, M.D.    DATE OF BIRTH:  1956-01-26   DATE OF PROCEDURE:  12/15/2005  DATE OF DISCHARGE:                                 OPERATIVE REPORT   PREOPERATIVE DIAGNOSIS:  Osteoarthritis of right hip.   POSTOPERATIVE DIAGNOSIS:  Osteoarthritis of right hip.   PROCEDURE:  Right total hip arthroplasty.   SURGEON:  Ollen Gross, M.D.   ASSISTANT:  Avel Peace, P.A.   ANESTHESIA:  General.   ESTIMATED BLOOD LOSS:  300   DRAINS:  Hemovac x1.   COMPLICATIONS:  None.   CONDITION:  Stable, to Recovery.   BRIEF CLINICAL NOTE:  [name] is a 55 year old female who has significant  osteoarthritic change of the right hip.  She has had arthroscopy x2 and has  developed intractable pain with progressive joint space narrowing.  Given  that she is having intractable pain, she presents now for a total hip  arthroplasty.   PROCEDURE IN DETAIL:  After the successful administration of a general  anesthetic, the patient is placed in the left lateral decubitus position  with the right side up and held with the hip positioner.  The right lower  extremity is isolated from her perineum with plastic drapes and prepped and  draped in the usual sterile fashion.  A short posterolateral incision is  made with a 10 blade through subcutaneous tissue to the level of the fascia  lata, which is incised in line with the skin incision.  The sciatic nerve is  palpated and protected and the short rotators isolated off the femur.  Capsulectomy is performed and the hip is dislocated.  Trial prosthesis is  placed such that the center of the trial head corresponds to the center of  her native femoral head.  Osteotomy lines are marked on the femoral neck and  osteotomy made with an oscillating saw.  Femoral head  is removed and then  femur retracted anteriorly to gain acetabular exposure.   Acetabular reaming starts at a 45, coursing in increments of 2 mm up the 49  mm and then a 50-mm Pinnacle acetabular shell is impacted into the  acetabulum in anatomic position and transfixed with 2 dome screws with  excellent purchase.  We threaded the impaction handle back into the cup and  the pelvis and cup were moving together as a solitary unit, consistent with  a very stable cup.  We then placed the trial 28-mm neutral liner.   The femur is prepared with the canal finder, then irrigation.  Axial reaming  is performed up to 13.5 mm, proximal reaming to an 18-D and the sleeve  machined to a small.  An 18-D small trial sleeve is placed with the 18 x 13  stem and 36 +8 neck about 5 degrees beyond her native anteversion.  The hip  is reduced with a 28 +0 head.  There is great stability with full extension,  flexion  and rotation to 70 degrees of flexion, 40 degrees of adduction, 90  degrees of internal rotation, 90 degrees of flexion and 70 degrees of  internal rotation.  The hip is then dislocated and all trials are removed.  The apex hole eliminator is placed into the acetabular shell and then a 28-  mm neutral Ultamet metal shell is placed.  This is a metal-on-metal hip  replacement.  The 18-D small sleeve is then placed into the proximal femur  with the 18 x 13 stem and a 36 +8 neck, again about 5 degrees beyond native  anteversion.  Once impacted, then the 28 +0 head is placed and the hip is  reduced with the same stability parameters.  The wound is copiously  irrigated with saline solution and the short rotators reattached to the  femur through drill holes.  The fascia lata is closed over a Hemovac drain  with interrupted #1 Vicryl, subcu closed with #1 and 2-0 Vicryl and  subcuticular running 4-0 Monocryl.  Incision is cleaned and dried and Steri-  Strips and bulky sterile dressing applied.  She is then  placed into a knee  immobilizer, awakened and transported to Recovery in stable condition.      Ollen Gross, M.D.  Electronically Signed     FA/MEDQ  D:  12/15/2005  T:  12/16/2005  Job:  [id number]

## 2011-03-05 NOTE — Op Note (Signed)
[name], [name]             ACCOUNT NO.:  [id number]   MEDICAL RECORD NO.:  [id number]          PATIENT TYPE:  AMB   LOCATION:  DAY                          FACILITY:  [facility]   PHYSICIAN:  Ollen Gross, M.D.    DATE OF BIRTH:  24-Oct-1955   DATE OF PROCEDURE:  DATE OF DISCHARGE:                                 OPERATIVE REPORT   PREOPERATIVE DIAGNOSIS:  Right hip labral tear and chondral defect.   POSTOPERATIVE DIAGNOSIS:  Right hip labral tear and chondral defect.   PROCEDURE:  Right hip arthroscopy with labral debridement and chondroplasty.   SURGEON:  Ollen Gross, M.D.   ASSISTANT:  Alexzandrew L. Julien Girt, P.A.   ANESTHESIA:  General.   ESTIMATED BLOOD LOSS:  Minimal.   DRAINS:  None.   COMPLICATIONS:  None.   CONDITION:  Stable to recovery room.   BRIEF CLINICAL NOTE:  [name] is a 55 year old female with significant  right hip pain and  mechanical symptoms. She has had progressive worsening  pain over several month time span and as failed non-operative management  including injection. She presents now for arthroscopic debridement.   PROCEDURE IN DETAIL:  After successful administration of general anesthetic,  the patient is placed on the fracture table in left lateral decubitus  position with the right side up. The right lower extremity is draped over  the padded perineal post and right foot placed into the well-padded traction  boot. Traction is applied across the hip joint. Under fluoroscopic guidance  we applied traction until the joint was adequately distracted. Once  adequately distracted, the hip was prepped and draped in usual sterile  fashion. Spinal needles were placed to mark the anterior and posterior  peritrochanteric portals. They are passed into the joint. Once confirmed to  be intra-articular, then saline injected through the posterior needle  showing outflow through the anterior needle confirming that both are an  intra-articular. Small  incision was then made around posterior portal,  dilator placed and the 5 mm cannula placed. A camera passed into the joint  and visualization proceeds.   Femoral head posteriorly has an area of exposed bone about 1 x 1  cm in  size. There is some unstable-appearing cartilage adjacent to this. Posterior  aspect of the acetabulum looks fine. The fovea looks fine. We looked  anteriorly and there is a tear at the anterior-inferior labrum with the  chondral defect, anterior-inferior, anterior central aspect of the  acetabulum. Femoral head looked fine anteriorly. The second portal was  created and a small incision made and dilator placed. We debrided the  chondral defect on the femoral head with the shaver through the anterior  portal.  We had to go a little but further anteriorly to get to the anterior  labral tear, thus I made a third incision just about a centimeter anterior  to the second incision.   The portal was created and the labrum is debrided back to stable base with  the shaver and the ArthroCare device. Chondroplasty then performed at 1 x 2  cm area of the anterior acetabulum with the shaver  and the ArthroCare.  Everything is probed and now found to be stable. No other areas of loose  cartilage are noted. The arthroscopic equipment was then removed from the  anterior portal and then 20 mL of 0.5% Marcaine with epi injected through  the inflow cannula. That cannula was removed and traction is released. We  confirmed under fluoroscopic guidance that the hip was concentrically  reduced.   The incisions were then closed with interrupted 4-0 nylon. Bulky sterile  dressing applied. She is awakened and transferred to recovery in stable  condition.      Ollen Gross, M.D.  Electronically Signed     FA/MEDQ  D:  06/30/2005  T:  07/01/2005  Job:  [id number]

## 2011-03-05 NOTE — Op Note (Signed)
NAME:  [name], [name]                       ACCOUNT NO.:  [id number]   MEDICAL RECORD NO.:  [id number]                   PATIENT TYPE:  OIB   LOCATION:  2870                                 FACILITY:  [facility]   PHYSICIAN:  Ollen Gross, M.D.                 DATE OF BIRTH:  10-30-1955   DATE OF PROCEDURE:  11/28/2003  DATE OF DISCHARGE:                                 OPERATIVE REPORT   PREOPERATIVE DIAGNOSIS:  Right hip intractable pain with labral tear.   POSTOPERATIVE DIAGNOSIS:  Right hip intractable pain with labral tear.   PROCEDURE:  Right hip arthroscopy with labral debridement and chondroplasty.   SURGEON:  Ollen Gross, M.D.   ASSISTANT:  None.   ANESTHESIA:  General.   ESTIMATED BLOOD LOSS:  Minimal.   DRAINS:  None.   COMPLICATIONS:  None.   CONDITION:  Stable to the recovery room.   BRIEF CLINICAL NOTE:  [name] is a 55 year old female with severe right hip  pain and mechanical symptoms.  Exam and history are consistent with labral  tear.  She has a temporary short response to a cortisone injection and the  pain and mechanical symptoms have recurred.  She presents now for  arthroscopy and debridement.   PROCEDURE IN DETAIL:  After successful administration of general anesthetic,  the patient was placed in the left lateral decubitus position with the right  side up and her right leg draped over the well padded lateral perineal post.  Her right foot is placed into the well padded traction boot and traction is  applied across the right lower extremity to distract the joint under  fluoroscopic guidance.  Once adequately distracted, then the hip was prepped  and draped in the usual sterile fashion.  A spinal needle was used to  localize the anterior peritrochanteric and posterior peritrochanteric  portals.  The wire was then passed into the joint posteriorly.  We then  injected 20 mL of saline through the anterior needle, removed the wire, and  it did show  that the flow was coming through the posterior needle confirming  that both are intra-articular.  The incision was made posteriorly and then  the 5 mm dilator placed and the 5 mm cannula placed.  The camera was passed  into the joint, inflow initiated, and arthroscopic visualization proceeds.  The acetabular surface looked great with the exception of the anterior  inferior corner which showed what appeared to be a small chondral defect.  There was a fair amount of crystallinization in the femoral head cartilage  with no femoral head cartilage loss.  There was some crystallinization of  the acetabular cartilage also consistent with chondral calcinosis.  The  posterior aspect of the joint is visualized.  There is no evidence of any  damage to the labrum.  The same is true superiorly.  Anteriorly, there is a  labral tear.  We established an anterior portal  and debrided the tear back  to a stable base with the ArthroCare device and the 4.2 shaver.  There was a  small 5 by 5 mm chondral defect at the anterior inferior acetabulum which  was debrided back to a stable bony base and stable cartilaginous edges.  The  joint was again inspected thoroughly and there was no evidence of any  cartilage abnormalities.  We subsequently removed the arthroscopic equipment  from the anterior portal, injected 20 mL of 0.25% Marcaine with epinephrine  through the posterior portal.  We removed that and then released the  traction.  Repeat fluoroscopic spot shows that the hip is concentrically  reduced.  The incisions were then closed with interrupted 4-0 nylon and a  bulky, sterile dressing was applied.  She was then awakened and transferred  to the recovery room in stable condition.                                               Ollen Gross, M.D.    FA/MEDQ  D:  11/28/2003  T:  11/28/2003  Job:  [id number]

## 2011-03-05 NOTE — H&P (Signed)
[name], [name]             ACCOUNT NO.:  [id number]   MEDICAL RECORD NO.:  [id number]          PATIENT TYPE:  INP   LOCATION:  NA                           FACILITY:  [facility]   PHYSICIAN:  Ollen Gross, M.D.    DATE OF BIRTH:  Jul 31, 1956   DATE OF ADMISSION:  DATE OF DISCHARGE:                                HISTORY & PHYSICAL   DATE OF OFFICE VISIT HISTORY AND PHYSICAL:  November 30, 2005   DATE OF ADMISSION:  December 15, 2005   CHIEF COMPLAINT:  Right hip pain.   HISTORY OF PRESENT ILLNESS:  The patient is a 55 year old female seen by Dr.  Lequita Halt for ongoing right hip problems. She has previously undergone a right  hip arthroscopy for a labral tear but noted to have also arthritic changes  about the hip. Unfortunately, she has gone on to have progressive worsening  pain with worsening symptoms in the right hip, with some arthritic changes.  She has failed nonoperative management and also failed arthroscopic  debridement with medical management. At this point the only predictable way  of improving her function is a hip replacement. Risks and benefits have been  discussed and she elects to proceed with surgery.   ALLERGIES:  PENICILLIN causes hives.   CURRENT MEDICATIONS:  Hydrocodone, methocarbamol, Prevacid.   PAST MEDICAL HISTORY:  1.  History of bronchitis.  2.  Reflux disease.  3.  History hepatitis as a child.   PAST SURGICAL HISTORY:  1.  Disk surgery.  2.  Left wrist surgery.  3.  Appendectomy.  4.  She has undergone right hip arthroscopy x2, one in 2005 and again in      2006.   SOCIAL HISTORY:  Married. Works at [employer]. Less than a pack-per-  day smoker. No alcohol. One child.   FAMILY HISTORY:  Father age 35 in good health. Mother with a history of  stroke. Grandparents with a history of heart disease and diabetes.   REVIEW OF SYSTEMS:  GENERAL:  No fevers, chills, night sweats. NEURO:  No  seizure, syncope, paralysis. RESPIRATORY:  No  shortness of breath,  productive cough, or hemoptysis. CARDIOVASCULAR:  No chest pain, angina,  orthopnea. GI:  No nausea, vomiting, diarrhea, constipation. GU:  No  dysuria, hematuria, discharge. MUSCULOSKELETAL:  Right hip.   PHYSICAL EXAMINATION:  VITAL SIGNS:  Pulse 84, respirations 14, blood  pressure 124/70.  GENERAL:  A 55 year old white female, well-nourished, well-developed, no  acute distress. She is accompanied by her daughter. Petite frame, very  pleasant.  HEENT:  Normocephalic, atraumatic. Pupils round and reactive. Oropharynx  clear. EOMs intact. She does have full upper and lower denture plates.  NECK:  Supple.  CHEST:  Clear. No rhonchi or rales.  HEART:  Regular rate and rhythm, no murmur. S1, S2 noted.  ABDOMEN:  Soft, nontender, bowel sounds present.  RECTAL, BREAST, GENITALIA:  Not done, not pertinent to present illness.  EXTREMITIES:  Right hip shows hip range of motion of flexion 90 degrees,  internal rotation of 20, external rotation of 20, abduction of 30.   IMPRESSION:  1.  Osteoarthritis right hip.  2.  History of bronchitis.  3.  Reflux disease.  4.  History of hepatitis as a child.   PLAN:  The patient will be admitted to [facility] and undergo  right total hip arthroplasty. The surgery will be performed by Dr. Ollen Gross. She has been seen preoperatively by Dr. [name] and cleared  for surgery.      Alexzandrew L. Julien Girt, P.A.      Ollen Gross, M.D.  Electronically Signed    ALP/MEDQ  D:  12/07/2005  T:  12/08/2005  Job:  [id number]   cc:   [name], M.D.  Fax: [phone]

## 2011-03-05 NOTE — Discharge Summary (Signed)
NAMEHOLLIS, Dougherty             ACCOUNT NO.:  [id number]   MEDICAL RECORD NO.:  [id number]          PATIENT TYPE:  INP   LOCATION:  1517                         FACILITY:  [facility]   PHYSICIAN:  Ollen Gross, M.D.    DATE OF BIRTH:  1956-03-15   DATE OF ADMISSION:  12/15/2005  DATE OF DISCHARGE:  12/19/2005                                 DISCHARGE SUMMARY   ADMISSION DIAGNOSES:  1.  Osteoarthritis, right hip.  2.  History of bronchitis.  3.  Reflux disease.  4.  History of hepatitis as a child.   DISCHARGE DIAGNOSES:  1.  Osteoarthritis, right hip, status post right total hip arthroplasty.  2.  Postoperative blood loss anemia.  3.  Status post transfusion without sequelae.  4.  Postoperative hypokalemia, improved.  5.  History of bronchitis.  6.  Reflux disease.  7.  History of hepatitis as a child.   PROCEDURE:  On December 15, 2005, right total hip.   SURGEON:  Ollen Gross, M.D.   ASSISTANT:  Alexzandrew L. Perkins, P.A.-C.   ANESTHESIA:  General.   CONSULTS:  None.   BRIEF HISTORY:  [name] is a 55 year old female with severe significant  osteoarthritic changes in the right hip.  She has had an arthroscopy x2,  developed intractable pain with progressive joint space narrowing.  Now  presents for a total hip arthroplasty.   LABORATORY DATA:  Preop CBC:  Hemoglobin 12.4, hematocrit 36.4.  Normal  white count.  Hemoglobin drifted to 9.3 and then later to 7.4.  Due to the  low hemoglobin, she was given blood.  Post transfusion hemoglobin 11.1 and  32.9 hematocrit.  PT/PTT on admission was 12.7 and 30, respectively with an  INR of 0.9.  Serial pro times are followed.  Last noted PT/INR 25 and 2.3.  Chem panel on admission all within normal limits.  Serial BMETs are  followed.  Potassium dropped from 5.1 to 3.3, back up to 3.9.  Glucose went  up from 97 to 145, back down to 90.  Calcium dropped from 9.2 to 8.3, back  up to 8.5.  Preop UA:  Small bili, otherwise  negative.  Blood group type B+.   Right hip films, December 08, 2005:  Mild loss of joint space, right hip,  with hypertrophic spurring.  No acute bony abnormalities.  Portable pelvis  and hip, December 07, 2005.  Both show satisfactory appearance following  right total hip.   HOSPITAL COURSE:  Patient was admitted to [facility], tolerated  the procedure well, and was later transferred to the recovery room and then  to the orthopedic floor.  Started on PCA and p.o. analgesics for pain  control.  Had excellent urinary output following surgery.  Hemoglobin was  9.3.  Electrolytes looked okay.  Tried to wean off PCA.  Encouraged p.o.  meds.  Started getting up with physical therapy to the chair.   By day #2, was sore but encouraged p.o. meds.  Unfortunately, her hemoglobin  dropped further down to 7.4.  Discussed blood. Given 2 units of blood.  Post  transfusion hemoglobin  excellent.  Came up to 11.  Her potassium had dropped  also down to 3.3.  She was given potassium supplements.  She also noted to  have some asymptomatic hypotension, it was felt secondary to her blood loss,  which was corrected by the transfusion.  Her blood pressure came back up on  the following day.  She was normal.  Progressing with physical therapy.  She  was up ambulating approximately 90 feet by day #3.  Continued to do well.  Needed one more day of therapy.  Was discharged home on the following day on  December 19, 2005.   DISCHARGE PLAN:  1.  Patient was discharged home on March 4 , 2007.  2.  Discharge diagnoses:  Please see above.  3.  Discharge meds:  Coumadin, Percocet, Robaxin.  4.  Diet:  Resume previous home diet.  5.  Follow up in two weeks.  Call the office for an appointment.  6.  Activity:  Partial weightbearing at 25-50%, right lower extremity.  Home      health PT/OT and home health nursing.  Total hip protocol.  May start      showering.  Daily dressing changes.   DISPOSITION:   Home.   CONDITION ON DISCHARGE:  Improved.      Alexzandrew L. Julien Girt, P.A.      Ollen Gross, M.D.  Electronically Signed    ALP/MEDQ  D:  01/06/2006  T:  01/07/2006  Job:  [id number]   cc:   [name], M.D.  Fax: [phone]

## 2011-07-09 LAB — COMPREHENSIVE METABOLIC PANEL
ALT: 25
AST: 20
Albumin: 3.4 — ABNORMAL LOW
Alkaline Phosphatase: 62
BUN: 8
CO2: 27
Calcium: 9.3
Chloride: 105
Creatinine, Ser: 0.56
GFR calc Af Amer: >60
GFR calc non Af Amer: >60
Glucose, Bld: 94
Potassium: 4.1
Sodium: 137
Total Bilirubin: 0.5
Total Protein: 6.6

## 2011-07-09 LAB — DIFFERENTIAL
Basophils Absolute: 0
Basophils Relative: 1
Monocytes Relative: 9
Neutro Abs: 3.3
Neutrophils Relative %: 47

## 2011-07-09 LAB — CARDIAC PANEL(CRET KIN+CKTOT+MB+TROPI)
CK, MB: 0.7
Relative Index: INVALID
Total CK: 40
Total CK: 43
Troponin I: 0.02

## 2011-07-09 LAB — BASIC METABOLIC PANEL
BUN: 11
CO2: 27
Chloride: 107
Chloride: 109
Creatinine, Ser: 0.52
GFR calc Af Amer: >60
Potassium: 3.9
Sodium: 142

## 2011-07-09 LAB — POCT CARDIAC MARKERS
CKMB, poc: <1 — ABNORMAL LOW
Myoglobin, poc: 23.3
Myoglobin, poc: 34.4
Operator id: 257131
Troponin i, poc: <0.05

## 2011-07-09 LAB — CBC
HCT: 34.6 — ABNORMAL LOW
Hemoglobin: 11.7 — ABNORMAL LOW
MCHC: 33.8
MCHC: 33.9
MCHC: 34.1
MCV: 100.2 — ABNORMAL HIGH
Platelets: 271
RBC: 3.45 — ABNORMAL LOW
RBC: 3.53 — ABNORMAL LOW
RBC: 3.61 — ABNORMAL LOW
RDW: 14.5
RDW: 14.6
WBC: 6.4
WBC: 6.6

## 2011-07-09 LAB — I-STAT 8, (EC8 V) (CONVERTED LAB)
BUN: 16
Bicarbonate: 22.9
Glucose, Bld: 97
Hemoglobin: 15
Sodium: 138
pH, Ven: 7.336 — ABNORMAL HIGH

## 2011-07-09 LAB — TSH
TSH: 1.732
TSH: 5.078
TSH: 7.089 — ABNORMAL HIGH

## 2011-07-09 LAB — T4, FREE: Free T4: 1.29

## 2011-07-09 LAB — T3, FREE: T3, Free: 3.4 (ref 2.3–4.2)

## 2011-10-20 ENCOUNTER — Encounter: Payer: Self-pay | Admitting: Internal Medicine

## 2011-10-20 ENCOUNTER — Ambulatory Visit (INDEPENDENT_AMBULATORY_CARE_PROVIDER_SITE_OTHER): Payer: BC Managed Care – PPO | Admitting: Internal Medicine

## 2011-10-20 VITALS — BP 118/60 | HR 88 | Ht 62.0 in | Wt 126.8 lb

## 2011-10-20 DIAGNOSIS — Z86010 Personal history of colonic polyps: Secondary | ICD-10-CM

## 2011-10-20 DIAGNOSIS — K219 Gastro-esophageal reflux disease without esophagitis: Secondary | ICD-10-CM

## 2011-10-20 DIAGNOSIS — D509 Iron deficiency anemia, unspecified: Secondary | ICD-10-CM

## 2011-10-20 MED ORDER — PEG-KCL-NACL-NASULF-NA ASC-C 100 G PO SOLR: 1.0000 | Freq: Once | ORAL | Status: DC

## 2011-10-20 NOTE — Patient Instructions (Addendum)
You have been given a separate informational sheet regarding your tobacco use, the importance of quitting and local resources to help you quit.  You have been scheduled for a colonoscopy. Please follow written instructions given to you at your visit today.  Please pick up your prep kit at the pharmacy within the next 2-3 days.  We have sent the following medications to your pharmacy for you to pick up at your convenience: moviprep, instructions given to you at your appointment today.

## 2011-10-20 NOTE — Progress Notes (Signed)
HISTORY OF PRESENT ILLNESS:  [name] is a 56 y.o. female with hyperlipidemia, arthritis, chronic tobacco abuse, GERD, adenomatous colon polyps, gastric ulcers, and duodenal ulcers. She said today regarding iron deficiency anemia. Her last colonoscopy was in April of 2009. She was found to have a diminutive left-sided adenoma. Otherwise normal exam. She has had several upper endoscopies. January 2010 with both gastric and duodenal ulcer disease. Followup July 2011 which is duodenal ulcer disease. Testing for H. Pylori negative.normal salicylate level. She does have a history of headache powder use. She had been on twice a day PPI but subsequently discontinued this. She has had intermittent upper, discomfort and reflux symptoms. Outpatient blood work from early December 2012 reveals anemia with hemoglobin of 11.2. Normal MCV. Subsequent iron studies with iron deficiency including iron saturation of 6% and ferritin level of 9. Her only other complaint is intermittent nausea. No melena or hematochezia.  REVIEW OF SYSTEMS:  All non-GI ROS negative except for arthritis, back pain, cough, fatigue, and headaches  Past Medical History  Diagnosis Date  . GERD (gastroesophageal reflux disease)   . Duodenal ulcer   . Internal hemorrhoids   . Hx of colonic polyps   . Arthritis   . Hyperlipemia   . Family history of malignant neoplasm of gastrointestinal tract     Past Surgical History  Procedure Date  . Abdominal hysterectomy   . Appendectomy   . Total hip arthroplasty     right  . Lumbar disc surgery   . Elbow surgery     rt  . Wrist surgery     left  . Hand surgery     Social History [name]  reports that she has been smoking Cigarettes.  She has a 28.8 pack-year smoking history. She does not have any smokeless tobacco history on file. She reports that she does not drink alcohol or use illicit drugs.  family history includes Colon cancer in an unspecified family member;  Colon polyps in her mother; Diabetes in her maternal grandfather; Heart disease in her maternal grandfather and maternal grandmother; and Inflammatory bowel disease in her mother.  Allergies  Allergen Reactions  . Penicillins     REACTION: whelps       PHYSICAL EXAMINATION: Vital signs: BP 118/60  Pulse 88  Ht 5\' 2"  (1.575 m)  Wt 126 lb 12.8 oz (57.516 kg)  BMI 23.19 kg/m2  Constitutional: generally well-appearing, no acute distress Psychiatric: alert and oriented x3, cooperative Eyes: extraocular movements intact, anicteric, conjunctiva pink Mouth: oral pharynx moist, no lesions Neck: supple no lymphadenopathy Cardiovascular: heart regular rate and rhythm, no murmur Lungs: clear to auscultation bilaterally Abdomen: soft, nontender, nondistended, no obvious ascites, no peritoneal signs, normal bowel sounds, no organomegaly Rectal:deferred until colonoscopy Extremities: no lower extremity edema bilaterally Skin: no lesions on visible extremities Neuro: No focal deficits.   ASSESSMENT:  #1. Iron deficiency anemia. Rule out GI mucosal pathology #2. History of gastric ulcers #3. History of duodenal ulcer disease. Last upper endoscopy July 2011 #4. History of adenomatous colon polyps. Last colonoscopy April 2009.   PLAN:  #1. Colonoscopy and upper endoscopy.The nature of the procedure, as well as the risks, benefits, and alternatives were carefully and thoroughly reviewed with the patient. Ample time for discussion and questions allowed. The patient understood, was satisfied, and agreed to proceed. Movi prep prescribed. The patient instructed on its use. #2. Resume PPI use #3. Iron replacement

## 2011-11-04 ENCOUNTER — Ambulatory Visit (AMBULATORY_SURGERY_CENTER): Payer: BC Managed Care – PPO | Admitting: Internal Medicine

## 2011-11-04 ENCOUNTER — Encounter: Payer: Self-pay | Admitting: Internal Medicine

## 2011-11-04 VITALS — BP 106/74 | HR 81 | Temp 96.3°F | Resp 18 | Ht 62.0 in | Wt 126.0 lb

## 2011-11-04 DIAGNOSIS — K269 Duodenal ulcer, unspecified as acute or chronic, without hemorrhage or perforation: Secondary | ICD-10-CM

## 2011-11-04 DIAGNOSIS — K219 Gastro-esophageal reflux disease without esophagitis: Secondary | ICD-10-CM

## 2011-11-04 DIAGNOSIS — Z1211 Encounter for screening for malignant neoplasm of colon: Secondary | ICD-10-CM

## 2011-11-04 DIAGNOSIS — R1084 Generalized abdominal pain: Secondary | ICD-10-CM

## 2011-11-04 DIAGNOSIS — Z86010 Personal history of colonic polyps: Secondary | ICD-10-CM

## 2011-11-04 DIAGNOSIS — D509 Iron deficiency anemia, unspecified: Secondary | ICD-10-CM

## 2011-11-04 MED ORDER — SODIUM CHLORIDE 0.9 % IV SOLN: 500.0000 mL | INTRAVENOUS | Status: DC

## 2011-11-04 NOTE — Op Note (Signed)
[facility] [address] [place], [place]  [address]  ENDOSCOPY PROCEDURE REPORT  PATIENT:  Brenda, Dougherty  MR#:  [id number] BIRTHDATE:  [birth year], 55 yrs. old  GENDER:  female  ENDOSCOPIST:  [name]. Eda Keys, MD Referred by:  [name], M.D.  PROCEDURE DATE:  11/04/2011 PROCEDURE:  EGD with biopsy, 43239 ASA CLASS:  Class II INDICATIONS:  iron deficiency anemia ; hx ulcers  MEDICATIONS:   MAC sedation, administered by CRNA, propofol (Diprivan) 100 mg IV TOPICAL ANESTHETIC:  none  DESCRIPTION OF PROCEDURE:   After the risks benefits and alternatives of the procedure were thoroughly explained, informed consent was obtained.  The LB GIF-H180 [id number] endoscope was introduced through the mouth and advanced to the second portion of the duodenum, without limitations.  The instrument was slowly withdrawn as the mucosa was fully examined. <<PROCEDUREIMAGES>>  Mild Esophagitis was found in the distal esophagus. Deformity from prior ulcer disease was found in the antrum.  Otherwise normal stomach.  Two clean based  ulcers measuring 4mm and found in the bulb of the duodenum. CLO Bx taken.    Retroflexed views revealed no abnormalities.    The scope was then withdrawn from the patient and the procedure completed.  COMPLICATIONS:  None  ENDOSCOPIC IMPRESSION: 1) Esophagitis in the distal esophagus 2) Deformity in the antrum from PUD 3) Otherwise normal stomach 4) Ulcers, in the bulb of duodenum  RECOMMENDATIONS: 1) Avoid NSAIDS , headache powders, ASA indefinitely 2) Continue OMEPRAZOLE 40 MG DAILY INDEFINITELY  ______________________________ [name]. Eda Keys, MD  CC:  [name], MD;  The Patient  n. eSIGNED:   [name]. Eda Keys at 11/04/2011 12:17 PM  [name], [name], [phone]

## 2011-11-04 NOTE — Op Note (Signed)
[facility] [address] [place], [place]  [address]  COLONOSCOPY PROCEDURE REPORT  PATIENT:  [name], [name]  MR#:  [id number] BIRTHDATE:  [birth year], 55 yrs. old  GENDER:  female ENDOSCOPIST:  [name]. Eda Keys, MD REF. BY:  Office PROCEDURE DATE:  11/04/2011 PROCEDURE:  Surveillance Colonoscopy ASA CLASS:  Class II INDICATIONS:  history of pre-cancerous (adenomatous) colon polyps, surveillance and high-risk screening, Iron deficiency anemia ; last exam 01-2008 w/ TA MEDICATIONS:   MAC sedation, administered by CRNA, propofol (Diprivan) 200 mg IV  DESCRIPTION OF PROCEDURE:   After the risks benefits and alternatives of the procedure were thoroughly explained, informed consent was obtained.  Digital rectal exam was performed and revealed no abnormalities.   The LB CF-H180AL [id number] endoscope was introduced through the anus and advanced to the cecum, which was identified by both the appendix and ileocecal valve, without limitations.  The quality of the prep was excellent, using MoviPrep.  The instrument was then slowly withdrawn as the colon was fully examined. <<PROCEDUREIMAGES>>  FINDINGS:  The terminal ileum appeared normal.  A normal appearing cecum, ileocecal valve, and appendiceal orifice were identified. The ascending, hepatic flexure, transverse, splenic flexure, descending, sigmoid colon, and rectum appeared unremarkable.  No polyps or cancers were seen.   Retroflexed views in the rectum revealed internal hemorrhoids.    The time to cecum = 2:53 minutes. The scope was then withdrawn in  8:36  minutes from the cecum and the procedure completed.  COMPLICATIONS:  None  ENDOSCOPIC IMPRESSION: 1) Normal terminal ileum 2) Normal colon 3) No polyps or cancers  RECOMMENDATIONS: 1) Follow up colonoscopy in 5 years (hx adenoma) 2) Upper endoscopy today  ______________________________ [name]. Eda Keys, MD  CC:  [name], MD; The  Patient  n. eSIGNED:   [name]. Eda Keys at 11/04/2011 12:04 PM  [place], [place], [phone]

## 2011-11-04 NOTE — Patient Instructions (Signed)
Please refer to report, blue and green discharge instruction sheets, and hand-outs.  Stay on Omeprazole.

## 2011-11-05 ENCOUNTER — Telehealth: Payer: Self-pay | Admitting: *Deleted

## 2011-11-05 NOTE — Telephone Encounter (Signed)
NO ANSWER, MESSAGE LEFT FOR THE PATIENT. 

## 2011-11-09 ENCOUNTER — Other Ambulatory Visit: Payer: Self-pay

## 2011-11-09 NOTE — Progress Notes (Signed)
Addended by: Thereasa Solo A on: 11/09/2011 11:58 AM   Modules accepted: Orders

## 2011-11-09 NOTE — Progress Notes (Signed)
Addended by: Thereasa Solo A on: 11/09/2011 11:56 AM   Modules accepted: Orders

## 2012-12-15 ENCOUNTER — Other Ambulatory Visit (HOSPITAL_COMMUNITY): Payer: Self-pay | Admitting: Internal Medicine

## 2012-12-15 DIAGNOSIS — Z1231 Encounter for screening mammogram for malignant neoplasm of breast: Secondary | ICD-10-CM

## 2012-12-15 DIAGNOSIS — N6019 Diffuse cystic mastopathy of unspecified breast: Secondary | ICD-10-CM

## 2012-12-15 DIAGNOSIS — I10 Essential (primary) hypertension: Secondary | ICD-10-CM

## 2012-12-28 ENCOUNTER — Ambulatory Visit (HOSPITAL_COMMUNITY)
Admission: RE | Admit: 2012-12-28 | Discharge: 2012-12-28 | Disposition: A | Discharge status: Home / Self Care | Payer: BC Managed Care – PPO | Source: Ambulatory Visit | Attending: Internal Medicine | Admitting: Internal Medicine

## 2012-12-28 DIAGNOSIS — I10 Essential (primary) hypertension: Secondary | ICD-10-CM

## 2012-12-28 DIAGNOSIS — N6019 Diffuse cystic mastopathy of unspecified breast: Secondary | ICD-10-CM

## 2012-12-28 DIAGNOSIS — Z1231 Encounter for screening mammogram for malignant neoplasm of breast: Secondary | ICD-10-CM | POA: Insufficient documentation

## 2012-12-28 DIAGNOSIS — Z8781 Personal history of (healed) traumatic fracture: Secondary | ICD-10-CM | POA: Insufficient documentation

## 2012-12-28 DIAGNOSIS — Z13820 Encounter for screening for osteoporosis: Secondary | ICD-10-CM | POA: Insufficient documentation

## 2013-01-01 ENCOUNTER — Ambulatory Visit: Payer: BC Managed Care – PPO | Admitting: Internal Medicine

## 2013-01-02 ENCOUNTER — Other Ambulatory Visit: Payer: Self-pay | Admitting: Internal Medicine

## 2013-01-02 DIAGNOSIS — R928 Other abnormal and inconclusive findings on diagnostic imaging of breast: Secondary | ICD-10-CM

## 2013-01-08 ENCOUNTER — Ambulatory Visit
Admission: RE | Admit: 2013-01-08 | Discharge: 2013-01-08 | Disposition: A | Discharge status: Home / Self Care | Payer: BC Managed Care – PPO | Source: Ambulatory Visit | Attending: Internal Medicine | Admitting: Internal Medicine

## 2013-01-08 DIAGNOSIS — R928 Other abnormal and inconclusive findings on diagnostic imaging of breast: Secondary | ICD-10-CM

## 2013-01-22 ENCOUNTER — Ambulatory Visit (INDEPENDENT_AMBULATORY_CARE_PROVIDER_SITE_OTHER): Payer: BC Managed Care – PPO | Admitting: Internal Medicine

## 2013-01-22 ENCOUNTER — Other Ambulatory Visit (INDEPENDENT_AMBULATORY_CARE_PROVIDER_SITE_OTHER): Payer: BC Managed Care – PPO

## 2013-01-22 ENCOUNTER — Encounter: Payer: Self-pay | Admitting: Internal Medicine

## 2013-01-22 VITALS — BP 128/88 | HR 60 | Ht 62.0 in | Wt 112.6 lb

## 2013-01-22 DIAGNOSIS — K2981 Duodenitis with bleeding: Secondary | ICD-10-CM

## 2013-01-22 DIAGNOSIS — Z860100 Personal history of colon polyps, unspecified: Secondary | ICD-10-CM

## 2013-01-22 DIAGNOSIS — D509 Iron deficiency anemia, unspecified: Secondary | ICD-10-CM

## 2013-01-22 DIAGNOSIS — K269 Duodenal ulcer, unspecified as acute or chronic, without hemorrhage or perforation: Secondary | ICD-10-CM

## 2013-01-22 LAB — CBC WITH DIFFERENTIAL/PLATELET
Basophils Absolute: 0 10*3/uL (ref 0.0–0.1)
Eosinophils Absolute: 0.2 10*3/uL (ref 0.0–0.7)
HCT: 37.9 % (ref 36.0–46.0)
Lymphs Abs: 2.4 10*3/uL (ref 0.7–4.0)
MCHC: 32.5 g/dL (ref 30.0–36.0)
MCV: 97.2 fl (ref 78.0–100.0)
Monocytes Absolute: 0.6 10*3/uL (ref 0.1–1.0)
Neutrophils Relative %: 45.4 % (ref 43.0–77.0)
Platelets: 386 10*3/uL (ref 150.0–400.0)
RDW: 20.5 % — ABNORMAL HIGH (ref 11.5–14.6)

## 2013-01-22 NOTE — Patient Instructions (Addendum)
You have been given a separate informational sheet regarding your tobacco use, the importance of quitting and local resources to help you quit.  Your physician has requested that you go to the basement for the following lab work before leaving today:  CBC

## 2013-01-22 NOTE — Progress Notes (Signed)
HISTORY OF PRESENT ILLNESS:  [name] is a 57 y.o. female with hyperlipidemia, chronic tobacco abuse, arthritis, GERD, adenomatous colon polyps, and NSAID-induced gastric and duodenal ulcers. She has been evaluated on a number of occasions for iron deficiency anemia, generally found secondary to ulcer disease. She is sent today regarding iron deficiency anemia. Her most recent endoscopic evaluation for the same was 11/04/2011. Complete colonoscopy revealed normal terminal ileum and normal colon. Followup in 5 years given the history of adenoma in 2009 recommended. Upper endoscopy 11/04/2011 revealed several small duodenal ulcers and mild esophagitis. Recommendation was for complete avoidance of NSAIDs and continuation of omeprazole indefinitely. Patient has not been on iron. She does take occasional NSAIDs. She has been compliant with PPI. Review of outside records from 12/15/2012 shows hemoglobin of 10.6 with MCV 88.1 and iron saturation 14%. B12 and folate levels were normal. Platelets and white blood cell count were normal. Patient's GI review of systems is negative. She does take ibuprofen several times a month for leg aches. No melena or hematochezia. She tolerates iron   REVIEW OF SYSTEMS:  All non-GI ROS negative except for fatigue and joint aches  Past Medical History  Diagnosis Date  . GERD (gastroesophageal reflux disease)   . Duodenal ulcer   . Internal hemorrhoids   . Hx of colonic polyps   . Arthritis   . Hyperlipemia   . Family history of malignant neoplasm of gastrointestinal tract   . Hypertension   . COPD (chronic obstructive pulmonary disease)   . Asthma   . Vitamin D deficiency     Past Surgical History  Procedure Laterality Date  . Abdominal hysterectomy    . Appendectomy    . Total hip arthroplasty      right  . Lumbar disc surgery    . Elbow surgery      rt  . Wrist surgery      left  . Hand surgery      Social History [name]  reports  that she has been smoking Cigarettes.  She has a 18 pack-year smoking history. She has never used smokeless tobacco. She reports that she does not drink alcohol or use illicit drugs.  family history includes Colon cancer in an unspecified family member; Colon polyps in her mother; Diabetes in her maternal grandfather; Heart disease in her maternal grandfather and maternal grandmother; and Inflammatory bowel disease in her mother.  Allergies  Allergen Reactions  . Crestor (Rosuvastatin)   . Penicillins     REACTION: whelps       PHYSICAL EXAMINATION: Vital signs: BP 128/88  Pulse 60  Ht 5\' 2"  (1.575 m)  Wt 112 lb 9.6 oz (51.075 kg)  BMI 20.59 kg/m2 General: Well-developed, well-nourished, no acute distress HEENT: Sclerae are anicteric, conjunctiva pink. Oral mucosa intact Lungs: Clear Heart: Regular Abdomen: soft, nontender, nondistended, no obvious ascites, no peritoneal signs, normal bowel sounds. No organomegaly. Extremities: No edema Psychiatric: alert and oriented x3. Cooperative   ASSESSMENT:  #1. Iron deficiency anemia. Suspect secondary to ulcer disease. No evidence for active GI bleeding #2. History of gastric and duodenal ulcers as described. Last endoscopy 1 year ago #3. History of adenomatous polyps. Negative colonoscopy one year ago   PLAN:  #1. Does not use any NSAIDs #2. Continue daily PPI #3. Continue iron 3 times a day #4. Check CBC today #5. No indication for endoscopic evaluation at this point #6. Stop smoking #7. Return to the care of PCP

## 2013-01-23 ENCOUNTER — Other Ambulatory Visit: Payer: Self-pay | Admitting: Internal Medicine

## 2013-01-23 DIAGNOSIS — D649 Anemia, unspecified: Secondary | ICD-10-CM

## 2013-04-27 ENCOUNTER — Other Ambulatory Visit (INDEPENDENT_AMBULATORY_CARE_PROVIDER_SITE_OTHER): Payer: BC Managed Care – PPO

## 2013-04-27 DIAGNOSIS — D649 Anemia, unspecified: Secondary | ICD-10-CM

## 2013-04-27 LAB — CBC WITH DIFFERENTIAL/PLATELET
Basophils Absolute: 0 10*3/uL (ref 0.0–0.1)
Basophils Relative: 0.4 % (ref 0.0–3.0)
Eosinophils Absolute: 0.2 10*3/uL (ref 0.0–0.7)
Lymphs Abs: 2.7 10*3/uL (ref 0.7–4.0)
MCHC: 33.6 g/dL (ref 30.0–36.0)
Neutro Abs: 2.9 10*3/uL (ref 1.4–7.7)
RBC: 3.75 Mil/uL — ABNORMAL LOW (ref 3.87–5.11)
RDW: 15.1 % — ABNORMAL HIGH (ref 11.5–14.6)
WBC: 6.5 10*3/uL (ref 4.5–10.5)

## 2013-10-15 ENCOUNTER — Ambulatory Visit (INDEPENDENT_AMBULATORY_CARE_PROVIDER_SITE_OTHER): Payer: BC Managed Care – PPO | Admitting: Emergency Medicine

## 2013-10-15 ENCOUNTER — Encounter: Payer: Self-pay | Admitting: Emergency Medicine

## 2013-10-15 VITALS — BP 118/62 | HR 84 | Temp 98.8°F | Resp 16 | Ht 62.5 in | Wt 105.0 lb

## 2013-10-15 DIAGNOSIS — J309 Allergic rhinitis, unspecified: Secondary | ICD-10-CM

## 2013-10-15 DIAGNOSIS — R059 Cough, unspecified: Secondary | ICD-10-CM

## 2013-10-15 DIAGNOSIS — R05 Cough: Secondary | ICD-10-CM

## 2013-10-15 DIAGNOSIS — E559 Vitamin D deficiency, unspecified: Secondary | ICD-10-CM

## 2013-10-15 DIAGNOSIS — R5381 Other malaise: Secondary | ICD-10-CM

## 2013-10-15 DIAGNOSIS — E782 Mixed hyperlipidemia: Secondary | ICD-10-CM

## 2013-10-15 DIAGNOSIS — Z79899 Other long term (current) drug therapy: Secondary | ICD-10-CM

## 2013-10-15 DIAGNOSIS — J189 Pneumonia, unspecified organism: Secondary | ICD-10-CM

## 2013-10-15 DIAGNOSIS — F17200 Nicotine dependence, unspecified, uncomplicated: Secondary | ICD-10-CM

## 2013-10-15 DIAGNOSIS — I10 Essential (primary) hypertension: Secondary | ICD-10-CM

## 2013-10-15 DIAGNOSIS — J209 Acute bronchitis, unspecified: Secondary | ICD-10-CM

## 2013-10-15 MED ORDER — LEVOFLOXACIN 500 MG PO TABS: 500.0000 mg | ORAL_TABLET | Freq: Every day | ORAL | Status: AC

## 2013-10-15 MED ORDER — ALBUTEROL SULFATE HFA 108 (90 BASE) MCG/ACT IN AERS: 2.0000 | INHALATION_SPRAY | Freq: Four times a day (QID) | RESPIRATORY_TRACT | Status: DC | PRN

## 2013-10-15 MED ORDER — DEXAMETHASONE SODIUM PHOSPHATE 100 MG/10ML IJ SOLN
10.0000 mg | Freq: Once | INTRAMUSCULAR | Status: AC
  Administered 2013-10-15: 10 mg via INTRAMUSCULAR

## 2013-10-15 MED ORDER — BENZONATATE 100 MG PO CAPS: 100.0000 mg | ORAL_CAPSULE | Freq: Three times a day (TID) | ORAL | Status: DC | PRN

## 2013-10-15 NOTE — Patient Instructions (Addendum)
Allergic Rhinitis Allergic rhinitis is when the mucous membranes in the nose respond to allergens. Allergens are particles in the air that cause your body to have an allergic reaction. This causes you to release allergic antibodies. Through a chain of events, these eventually cause you to release histamine into the blood stream (hence the use of antihistamines). Although meant to be protective to the body, it is this release that causes your discomfort, such as frequent sneezing, congestion and an itchy runny nose.  CAUSES  The pollen allergens may come from grasses, trees, and weeds. This is seasonal allergic rhinitis, or "hay fever." Other allergens cause year-round allergic rhinitis (perennial allergic rhinitis) such as house dust mite allergen, pet dander and mold spores.  SYMPTOMS   Nasal stuffiness (congestion).  Runny, itchy nose with sneezing and tearing of the eyes.  There is often an itching of the mouth, eyes and ears. It cannot be cured, but it can be controlled with medications. DIAGNOSIS  If you are unable to determine the offending allergen, skin or blood testing may find it. TREATMENT   Avoid the allergen.  Medications and allergy shots (immunotherapy) can help.  Hay fever may often be treated with antihistamines in pill or nasal spray forms. Antihistamines block the effects of histamine. There are over-the-counter medicines that may help with nasal congestion and swelling around the eyes. Check with your caregiver before taking or giving this medicine. If the treatment above does not work, there are many new medications your caregiver can prescribe. Stronger medications may be used if initial measures are ineffective. Desensitizing injections can be used if medications and avoidance fails. Desensitization is when a patient is given ongoing shots until the body becomes less sensitive to the allergen. Make sure you follow up with your caregiver if problems continue. SEEK MEDICAL  CARE IF:   You develop fever (more than 100.5 F (38.1 C).  You develop a cough that does not stop easily (persistent).  You have shortness of breath.  You start wheezing.  Symptoms interfere with normal daily activities. Document Released: 06/29/2001 Document Revised: 12/27/2011 Document Reviewed: 01/08/2009 [facility] Patient Information 2014 [place], [place]. Bronchitis Bronchitis is a problem of the air tubes leading to your lungs. This problem makes it hard for air to get in and out of the lungs. You may cough a lot because your air tubes are narrow. Going without care can cause lasting (chronic) bronchitis. HOME CARE   Drink enough fluids to keep your pee (urine) clear or pale yellow.  Use a cool mist humidifier.  Quit smoking if you smoke. If you keep smoking, the bronchitis might not get better.  Only take medicine as told by your doctor. GET HELP RIGHT AWAY IF:   Coughing keeps you awake.  You start to wheeze.  You become more sick or weak.  You have a hard time breathing or get short of breath.  You cough up blood.  Coughing lasts more than 2 weeks.  You have a fever.  Your baby is older than 3 months with a rectal temperature of 102 F (38.9 C) or higher.  Your baby is 70 months old or younger with a rectal temperature of 100.4 F (38 C) or higher. MAKE SURE YOU:  Understand these instructions.  Will watch your condition.  Will get help right away if you are not doing well or get worse. Document Released: 03/22/2008 Document Revised: 12/27/2011 Document Reviewed: 05/29/2013 [facility] Patient Information 2014 [place], [place]. Smoking Cessation, Tips for Success  YOU CAN QUIT SMOKING If you are ready to quit smoking, congratulations! You have chosen to help yourself be healthier. Cigarettes bring nicotine, tar, carbon monoxide, and other irritants into your body. Your lungs, heart, and blood vessels will be able to work better without these poisons. There  are many different ways to quit smoking. Nicotine gum, nicotine patches, a nicotine inhaler, or nicotine nasal spray can help with physical craving. Hypnosis, support groups, and medicines help break the habit of smoking. Here are some tips to help you quit for good.  Throw away all cigarettes.  Clean and remove all ashtrays from your home, work, and car.  On a card, write down your reasons for quitting. Carry the card with you and read it when you get the urge to smoke.  Cleanse your body of nicotine. Drink enough water and fluids to keep your urine clear or pale yellow. Do this after quitting to flush the nicotine from your body.  Learn to predict your moods. Do not let a bad situation be your excuse to have a cigarette. Some situations in your life might tempt you into wanting a cigarette.  Never have "just one" cigarette. It leads to wanting another and another. Remind yourself of your decision to quit.  Change habits associated with smoking. If you smoked while driving or when feeling stressed, try other activities to replace smoking. Stand up when drinking your coffee. Brush your teeth after eating. Sit in a different chair when you read the paper. Avoid alcohol while trying to quit, and try to drink fewer caffeinated beverages. Alcohol and caffeine may urge you to smoke.  Avoid foods and drinks that can trigger a desire to smoke, such as sugary or spicy foods and alcohol.  Ask people who smoke not to smoke around you.  Have something planned to do right after eating or having a cup of coffee. Take a walk or exercise to perk you up. This will help to keep you from overeating.  Try a relaxation exercise to calm you down and decrease your stress. Remember, you may be tense and nervous for the first 2 weeks after you quit, but this will pass.  Find new activities to keep your hands busy. Play with a pen, coin, or rubber band. Doodle or draw things on paper.  Brush your teeth right after  eating. This will help cut down on the craving for the taste of tobacco after meals. You can try mouthwash, too.  Use oral substitutes, such as lemon drops, carrots, a cinnamon stick, or chewing gum, in place of cigarettes. Keep them handy so they are available when you have the urge to smoke.  When you have the urge to smoke, try deep breathing.  Designate your home as a nonsmoking area.  If you are a heavy smoker, ask your caregiver about a prescription for nicotine chewing gum. It can ease your withdrawal from nicotine.  Reward yourself. Set aside the cigarette money you save and buy yourself something nice.  Look for support from others. Join a support group or smoking cessation program. Ask someone at home or at work to help you with your plan to quit smoking.  Always ask yourself, "Do I need this cigarette or is this just a reflex?" Tell yourself, "Today, I choose not to smoke," or "I do not want to smoke." You are reminding yourself of your decision to quit, even if you do smoke a cigarette. HOW WILL I FEEL WHEN I QUIT SMOKING?  The benefits of not smoking start within days of quitting.  You may have symptoms of withdrawal because your body is used to nicotine (the addictive substance in cigarettes). You may crave cigarettes, be irritable, feel very hungry, cough often, get headaches, or have difficulty concentrating.  The withdrawal symptoms are only temporary. They are strongest when you first quit but will go away within 10 to 14 days.  When withdrawal symptoms occur, stay in control. Think about your reasons for quitting. Remind yourself that these are signs that your body is healing and getting used to being without cigarettes.  Remember that withdrawal symptoms are easier to treat than the major diseases that smoking can cause.  Even after the withdrawal is over, expect periodic urges to smoke. However, these cravings are generally short-lived and will go away whether you  smoke or not. Do not smoke!  If you relapse and smoke again, do not lose hope. Most smokers quit 3 times before they are successful.  If you relapse, do not give up! Plan ahead and think about what you will do the next time you get the urge to smoke. LIFE AS A NONSMOKER: MAKE IT FOR A MONTH, MAKE IT FOR LIFE Day 1: Hang this page where you will see it every day. Day 2: Get rid of all ashtrays, matches, and lighters. Day 3: Drink water. Breathe deeply between sips. Day 4: Avoid places with smoke-filled air, such as bars, clubs, or the smoking section of restaurants. Day 5: Keep track of how much money you save by not smoking. Day 6: Avoid boredom. Keep a good book with you or go to the movies. Day 7: Reward yourself! One week without smoking! Day 8: Make a dental appointment to get your teeth cleaned. Day 9: Decide how you will turn down a cigarette before it is offered to you. Day 10: Review your reasons for quitting. Day 11: Distract yourself. Stay active to keep your mind off smoking and to relieve tension. Take a walk, exercise, read a book, do a crossword puzzle, or try a new hobby. Day 12: Exercise. Get off the bus before your stop or use stairs instead of escalators. Day 13: Call on friends for support and encouragement. Day 14: Reward yourself! Two weeks without smoking! Day 15: Practice deep breathing exercises. Day 16: Bet a friend that you can stay a nonsmoker. Day 17: Ask to sit in nonsmoking sections of restaurants. Day 18: Hang up "No Smoking" signs. Day 19: Think of yourself as a nonsmoker. Day 20: Each morning, tell yourself you will not smoke. Day 21: Reward yourself! Three weeks without smoking! Day 22: Think of smoking in negative ways. Remember how it stains your teeth, gives you bad breath, and leaves you short of breath. Day 23: Eat a nutritious breakfast. Day 24:Do not relive your days as a smoker. Day 25: Hold a pencil in your hand when talking on the  telephone. Day 26: Tell all your friends you do not smoke. Day 27: Think about how much better food tastes. Day 28: Remember, one cigarette is one too many. Day 29: Take up a hobby that will keep your hands busy. Day 30: Congratulations! One month without smoking! Give yourself a big reward. Your caregiver can direct you to community resources or hospitals for support, which may include:  Group support.  Education.  Hypnosis.  Subliminal therapy. Document Released: 07/02/2004 Document Revised: 12/27/2011 Document Reviewed: 03/22/2013 [facility] Patient Information 2014 [place], [place]. Pneumonia, Adult Pneumonia is an  infection of the lungs. It may be caused by a germ (virus or bacteria). Some types of pneumonia can spread easily from person to person. This can happen when you cough or sneeze. HOME CARE  Only take medicine as told by your doctor.  Take your medicine (antibiotics) as told. Finish it even if you start to feel better.  Do not smoke.  You may use a vaporizer or humidifier in your room. This can help loosen thick spit (mucus).  Sleep so you are almost sitting up (semi-upright). This helps reduce coughing.  Rest. A shot (vaccine) can help prevent pneumonia. Shots are often advised for:  People over 71 years old.  Patients on chemotherapy.  People with long-term (chronic) lung problems.  People with immune system problems. GET HELP RIGHT AWAY IF:   You are getting worse.  You cannot control your cough, and you are losing sleep.  You cough up blood.  Your pain gets worse, even with medicine.  You have a fever.  Any of your problems are getting worse, not better.  You have shortness of breath or chest pain. MAKE SURE YOU:   Understand these instructions.  Will watch your condition.  Will get help right away if you are not doing well or get worse. Document Released: 03/22/2008 Document Revised: 12/27/2011 Document Reviewed: 12/25/2010 [facility]  Patient Information 2014 [place], [place].

## 2013-10-15 NOTE — Progress Notes (Signed)
Subjective:    Patient ID: [name], female    DOB: [birth year], 57 y.o.   MRN: [id number]  HPI Comments: 57 yo noncompliant Hyperlipidemia/ Vit D Def over due for f/u. DOES NOT f/u AD for cholesterol recheck and did not tolerate RX cholesterol. Restarted smoking x 1 month with increased stress < 1/2 PPD. Losing weight with stress and decreased appetite. No exercise routinely but keeps busy. sHE NOTES SHE IS DOWN 2-3 PANTS SIZES.  LAST ABN LABS T 203 TG 224 LDL 116 MAG 1.8 D 48  She notes 2 weeks of increasing chest congestion with no relief with Delsym/ mucinex/ Tylenol/ Zyrtec.  Production occasional green/ white from chest and thick. She notes head ache with hard coughing and mild increase with fatigue since illness started.    Cough   Current Outpatient Prescriptions on File Prior to Visit  Medication Sig Dispense Refill  . acetaminophen (TYLENOL) 500 MG tablet Take 500 mg by mouth every 6 (six) hours as needed.        Marland Kitchen alendronate (FOSAMAX) 70 MG tablet Take 70 mg by mouth every 7 (seven) days. Take with a full glass of water on an empty stomach.      . Cholecalciferol (VITAMIN D) 2000 UNITS CAPS Take 7000 units a day       . ferrous sulfate 325 (65 FE) MG tablet Take 325 mg by mouth daily with breakfast.        . fish oil-omega-3 fatty acids 1000 MG capsule Take 2 g by mouth daily.      . Fluticasone-Salmeterol (ADVAIR DISKUS) 250-50 MCG/DOSE AEPB Inhale 1 puff into the lungs every 12 (twelve) hours.      Marland Kitchen omeprazole (PRILOSEC) 40 MG capsule Take 80 mg by mouth daily.       . fexofenadine (ALLEGRA) 180 MG tablet Take 180 mg by mouth as needed.      Marland Kitchen gemfibrozil (LOPID) 600 MG tablet Take 600 mg by mouth 2 (two) times daily before a meal.      . Loperamide-Simethicone (IMODIUM ADVANCED) 2-125 MG TABS Take by mouth as needed.         Current Facility-Administered Medications on File Prior to Visit  Medication Dose Route Frequency Provider Last Rate Last Dose  . 0.9 %   sodium chloride infusion  500 mL Intravenous Continuous Hilarie Fredrickson, MD       ALLERGIES Celebrex; Crestor; Penicillins; and Pravastatin  Past Medical History  Diagnosis Date  . GERD (gastroesophageal reflux disease)   . Duodenal ulcer   . Internal hemorrhoids   . Hx of colonic polyps   . Arthritis   . Hyperlipemia   . Family history of malignant neoplasm of gastrointestinal tract   . Hypertension   . COPD (chronic obstructive pulmonary disease)   . Asthma   . Vitamin D deficiency        Review of Systems  Constitutional: Positive for appetite change, fatigue and unexpected weight change.  HENT: Positive for congestion.   Respiratory: Positive for cough.   Psychiatric/Behavioral: Negative for suicidal ideas. The patient is nervous/anxious.   BP 118/62  Pulse 84  Temp(Src) 98.8 F (37.1 C) (Temporal)  Resp 16  Ht 5' 2.5" (1.588 m)  Wt 105 lb (47.628 kg)  BMI 18.89 kg/m2      Objective:   Physical Exam  Nursing note and vitals reviewed. Constitutional: She is oriented to person, place, and time. She appears well-developed. No distress.  THIN  HENT:  Head: Normocephalic and atraumatic.  Right Ear: External ear normal.  Left Ear: External ear normal.  Nose: Nose normal.  Mouth/Throat: Oropharynx is clear and moist. No oropharyngeal exudate.  Cloudy TMS, post pharynx mild erythema  Eyes: Conjunctivae and EOM are normal.  Neck: Normal range of motion. Neck supple. No JVD present. No thyromegaly present.  Cardiovascular: Normal rate, regular rhythm, normal heart sounds and intact distal pulses.   Pulmonary/Chest: Effort normal.  Bilateral Rhonchi  Abdominal: Soft. Bowel sounds are normal. She exhibits no distension and no mass. There is no tenderness. There is no rebound and no guarding.  Musculoskeletal: Normal range of motion. She exhibits no edema and no tenderness.  Lymphadenopathy:    She has no cervical adenopathy.  Neurological: She is alert and oriented to  person, place, and time. No cranial nerve deficit.  Skin: Skin is warm and dry. No rash noted. No erythema. No pallor.  Psychiatric: She has a normal mood and affect. Her behavior is normal. Judgment and thought content normal.          Assessment & Plan:  1. ? Pneumonia/ Bronchitis/ Cough/ Allergic rhinitis-Switch to Allegra OTC, increase H2o, allergy hygiene explained. Restart ALB Inhaler AD. Levaquin 500 mg, Tessalon perles 100mg  all AD 2. Tobacco dependence with possible COPD- Tobacco Cessation discussed. 3. NON COMPLIANT Hyperlipidemia/ Vit D Def- Recheck labs, needs better diet and cardio QD.  4. Stress/ anxiety with wt loss- Advised needs to start counseling, increase activity, w/c if wants to start RX- Check labs if continued wt loss needs further eval with repeat CXR/ CT ABD OVER 40 minutes of exam, counseling, chart review, referral performed

## 2013-10-16 LAB — BASIC METABOLIC PANEL WITH GFR
CO2: 23 mEq/L (ref 19–32)
Calcium: 8.9 mg/dL (ref 8.4–10.5)
Chloride: 108 mEq/L (ref 96–112)
Glucose, Bld: 88 mg/dL (ref 70–99)
Sodium: 140 mEq/L (ref 135–145)

## 2013-10-16 LAB — CBC WITH DIFFERENTIAL/PLATELET
Eosinophils Absolute: 0.2 10*3/uL (ref 0.0–0.7)
Eosinophils Relative: 2 % (ref 0–5)
HCT: 35.3 % — ABNORMAL LOW (ref 36.0–46.0)
Hemoglobin: 12.1 g/dL (ref 12.0–15.0)
Lymphocytes Relative: 41 % (ref 12–46)
Lymphs Abs: 3.2 10*3/uL (ref 0.7–4.0)
MCH: 32.1 pg (ref 26.0–34.0)
MCV: 93.6 fL (ref 78.0–100.0)
Monocytes Absolute: 0.8 10*3/uL (ref 0.1–1.0)
Monocytes Relative: 10 % (ref 3–12)
Platelets: 282 10*3/uL (ref 150–400)
RBC: 3.77 MIL/uL — ABNORMAL LOW (ref 3.87–5.11)
WBC: 7.9 10*3/uL (ref 4.0–10.5)

## 2013-10-16 LAB — HEPATIC FUNCTION PANEL
ALT: 15 U/L (ref 0–35)
Albumin: 3.6 g/dL (ref 3.5–5.2)
Alkaline Phosphatase: 80 U/L (ref 39–117)
Total Protein: 6.6 g/dL (ref 6.0–8.3)

## 2013-10-16 LAB — LIPID PANEL
HDL: 31 mg/dL — ABNORMAL LOW (ref >39)
Triglycerides: 171 mg/dL — ABNORMAL HIGH (ref <150)

## 2013-11-11 ENCOUNTER — Encounter: Payer: Self-pay | Admitting: Emergency Medicine

## 2013-11-11 DIAGNOSIS — J45909 Unspecified asthma, uncomplicated: Secondary | ICD-10-CM

## 2013-11-11 DIAGNOSIS — E559 Vitamin D deficiency, unspecified: Secondary | ICD-10-CM | POA: Insufficient documentation

## 2013-11-11 DIAGNOSIS — J449 Chronic obstructive pulmonary disease, unspecified: Secondary | ICD-10-CM

## 2013-11-16 ENCOUNTER — Encounter: Payer: Self-pay | Admitting: Emergency Medicine

## 2013-11-16 ENCOUNTER — Ambulatory Visit
Admission: RE | Admit: 2013-11-16 | Discharge: 2013-11-16 | Disposition: A | Discharge status: Home / Self Care | Payer: BC Managed Care – PPO | Source: Ambulatory Visit | Attending: Emergency Medicine | Admitting: Emergency Medicine

## 2013-11-16 ENCOUNTER — Ambulatory Visit (INDEPENDENT_AMBULATORY_CARE_PROVIDER_SITE_OTHER): Payer: BC Managed Care – PPO | Admitting: Emergency Medicine

## 2013-11-16 VITALS — BP 104/72 | HR 104 | Temp 98.0°F | Resp 18 | Ht 62.25 in | Wt 106.0 lb

## 2013-11-16 DIAGNOSIS — R05 Cough: Secondary | ICD-10-CM

## 2013-11-16 DIAGNOSIS — R059 Cough, unspecified: Secondary | ICD-10-CM

## 2013-11-16 DIAGNOSIS — J449 Chronic obstructive pulmonary disease, unspecified: Secondary | ICD-10-CM

## 2013-11-16 DIAGNOSIS — I10 Essential (primary) hypertension: Secondary | ICD-10-CM

## 2013-11-16 LAB — CBC WITH DIFFERENTIAL/PLATELET
BASOS ABS: 0 10*3/uL (ref 0.0–0.1)
BASOS PCT: 1 % (ref 0–1)
Eosinophils Absolute: 0.2 10*3/uL (ref 0.0–0.7)
Eosinophils Relative: 3 % (ref 0–5)
HCT: 39.1 % (ref 36.0–46.0)
Hemoglobin: 13.1 g/dL (ref 12.0–15.0)
Lymphocytes Relative: 47 % — ABNORMAL HIGH (ref 12–46)
Lymphs Abs: 3 10*3/uL (ref 0.7–4.0)
MCH: 31.8 pg (ref 26.0–34.0)
MCHC: 33.5 g/dL (ref 30.0–36.0)
MCV: 94.9 fL (ref 78.0–100.0)
MONO ABS: 0.5 10*3/uL (ref 0.1–1.0)
Monocytes Relative: 8 % (ref 3–12)
NEUTROS ABS: 2.6 10*3/uL (ref 1.7–7.7)
NEUTROS PCT: 41 % — AB (ref 43–77)
Platelets: 351 10*3/uL (ref 150–400)
RBC: 4.12 MIL/uL (ref 3.87–5.11)
RDW: 15.3 % (ref 11.5–15.5)
WBC: 6.4 10*3/uL (ref 4.0–10.5)

## 2013-11-16 MED ORDER — DOXYCYCLINE HYCLATE 100 MG PO TABS: 100.0000 mg | ORAL_TABLET | Freq: Two times a day (BID) | ORAL | Status: DC

## 2013-11-16 MED ORDER — ALBUTEROL SULFATE HFA 108 (90 BASE) MCG/ACT IN AERS: 2.0000 | INHALATION_SPRAY | Freq: Four times a day (QID) | RESPIRATORY_TRACT | Status: DC | PRN

## 2013-11-16 NOTE — Progress Notes (Signed)
Subjective:    Patient ID: [name], female    DOB: 18-Nov-1955, 58 y.o.   MRN: [id number]  HPI Comments: 58 yo smoker recently treated with ABX for infection. She is feeling better but still has cough and occasional production. She is eating a little better. She has maintained weight. She did not get CXR as directed.  Current Outpatient Prescriptions on File Prior to Visit  Medication Sig Dispense Refill  . acetaminophen (TYLENOL) 500 MG tablet Take 500 mg by mouth every 6 (six) hours as needed.        Marland Kitchen albuterol (PROVENTIL HFA;VENTOLIN HFA) 108 (90 BASE) MCG/ACT inhaler Inhale 2 puffs into the lungs every 6 (six) hours as needed for wheezing or shortness of breath.  1 Inhaler  2  . alendronate (FOSAMAX) 70 MG tablet Take 70 mg by mouth every 7 (seven) days. Take with a full glass of water on an empty stomach.      . benzonatate (TESSALON PERLES) 100 MG capsule Take 1 capsule (100 mg total) by mouth 3 (three) times daily as needed.  42 capsule  1  . Cholecalciferol (VITAMIN D) 2000 UNITS CAPS Take 7000 units a day       . ferrous sulfate 325 (65 FE) MG tablet Take 325 mg by mouth daily with breakfast.        . fexofenadine (ALLEGRA) 180 MG tablet Take 180 mg by mouth as needed.      . fish oil-omega-3 fatty acids 1000 MG capsule Take 2 g by mouth daily.      . Fluticasone-Salmeterol (ADVAIR DISKUS) 250-50 MCG/DOSE AEPB Inhale 1 puff into the lungs every 12 (twelve) hours.      Marland Kitchen gemfibrozil (LOPID) 600 MG tablet Take 600 mg by mouth 2 (two) times daily before a meal.      . Loperamide-Simethicone (IMODIUM ADVANCED) 2-125 MG TABS Take by mouth as needed.        Marland Kitchen omeprazole (PRILOSEC) 40 MG capsule Take 80 mg by mouth daily.        Current Facility-Administered Medications on File Prior to Visit  Medication Dose Route Frequency Provider Last Rate Last Dose  . 0.9 %  sodium chloride infusion  500 mL Intravenous Continuous Hilarie Fredrickson, MD       ALLERGIES Celebrex; Crestor;  Penicillins; and Pravastatin  Past Medical History  Diagnosis Date  . GERD (gastroesophageal reflux disease)   . Duodenal ulcer   . Internal hemorrhoids   . Hx of colonic polyps   . Arthritis   . Hyperlipemia   . Family history of malignant neoplasm of gastrointestinal tract   . Benign labile hypertension   . COPD (chronic obstructive pulmonary disease)   . Asthma   . Vitamin D deficiency       Review of Systems  HENT: Positive for postnasal drip.   Respiratory: Positive for cough.   All other systems reviewed and are negative.   BP 104/72  Pulse 104  Temp(Src) 98 F (36.7 C) (Temporal)  Resp 18  Ht 5' 2.25" (1.581 m)  Wt 106 lb (48.081 kg)  BMI 19.24 kg/m2  SpO2 97%     Objective:   Physical Exam  Nursing note and vitals reviewed. Constitutional: She is oriented to person, place, and time. She appears well-developed and well-nourished.  HENT:  Head: Normocephalic and atraumatic.  Right Ear: External ear normal.  Left Ear: External ear normal.  Nose: Nose normal.  Mouth/Throat: Oropharynx is clear and moist.  Eyes: Conjunctivae and EOM are normal.  Neck: Normal range of motion.  Cardiovascular: Normal rate, regular rhythm, normal heart sounds and intact distal pulses.   Pulmonary/Chest: Effort normal.  Rhonchi Right  Musculoskeletal: Normal range of motion.  Lymphadenopathy:    She has no cervical adenopathy.  Neurological: She is alert and oriented to person, place, and time.  Skin: Skin is warm and dry.  Psychiatric: She has a normal mood and affect. Judgment normal.          Assessment & Plan:  1.Cough with recent ABX treatment- Recheck labs and CXR, Doxy 100 BID/ Albuterol HFA/ Tessalon Perles all AD. w/c if SX increase or ER. 2. Tobacco cessation discussed

## 2013-11-16 NOTE — Patient Instructions (Signed)
Cough, Adult  A cough is a reflex. It helps you clear your throat and airways. A cough can help heal your body. A cough can last 2 or 3 weeks (acute) or may last more than 8 weeks (chronic). Some common causes of a cough can include an infection, allergy, or a cold. HOME CARE  Only take medicine as told by your doctor.  If given, take your medicines (antibiotics) as told. Finish them even if you start to feel better.  Use a cold steam vaporizer or humidier in your home. This can help loosen thick spit (secretions).  Sleep so you are almost sitting up (semi-upright). Use pillows to do this. This helps reduce coughing.  Rest as needed.  Stop smoking if you smoke. GET HELP RIGHT AWAY IF:  You have yellowish-white fluid (pus) in your thick spit.  Your cough gets worse.  Your medicine does not reduce coughing, and you are losing sleep.  You cough up blood.  You have trouble breathing.  Your pain gets worse and medicine does not help.  You have a fever. MAKE SURE YOU:   Understand these instructions.  Will watch your condition.  Will get help right away if you are not doing well or get worse. Document Released: 06/17/2011 Document Revised: 12/27/2011 Document Reviewed: 06/17/2011 [employer] Patient Information 2014 [employer].  

## 2013-12-19 ENCOUNTER — Encounter: Payer: Self-pay | Admitting: Internal Medicine

## 2014-01-01 ENCOUNTER — Encounter: Payer: Self-pay | Admitting: Physician Assistant

## 2014-01-01 ENCOUNTER — Ambulatory Visit (INDEPENDENT_AMBULATORY_CARE_PROVIDER_SITE_OTHER): Payer: BC Managed Care – PPO | Admitting: Physician Assistant

## 2014-01-01 VITALS — BP 110/64 | HR 84 | Temp 98.5°F | Resp 16 | Ht 62.0 in | Wt 108.0 lb

## 2014-01-01 DIAGNOSIS — M1990 Unspecified osteoarthritis, unspecified site: Secondary | ICD-10-CM

## 2014-01-01 DIAGNOSIS — Z0000 Encounter for general adult medical examination without abnormal findings: Secondary | ICD-10-CM

## 2014-01-01 DIAGNOSIS — E559 Vitamin D deficiency, unspecified: Secondary | ICD-10-CM

## 2014-01-01 DIAGNOSIS — I10 Essential (primary) hypertension: Secondary | ICD-10-CM

## 2014-01-01 DIAGNOSIS — Z79899 Other long term (current) drug therapy: Secondary | ICD-10-CM

## 2014-01-01 DIAGNOSIS — J449 Chronic obstructive pulmonary disease, unspecified: Secondary | ICD-10-CM

## 2014-01-01 DIAGNOSIS — Z1159 Encounter for screening for other viral diseases: Secondary | ICD-10-CM

## 2014-01-01 DIAGNOSIS — Z1331 Encounter for screening for depression: Secondary | ICD-10-CM

## 2014-01-01 DIAGNOSIS — J441 Chronic obstructive pulmonary disease with (acute) exacerbation: Secondary | ICD-10-CM

## 2014-01-01 DIAGNOSIS — J45909 Unspecified asthma, uncomplicated: Secondary | ICD-10-CM

## 2014-01-01 LAB — CBC WITH DIFFERENTIAL/PLATELET
BASOS ABS: 0.1 10*3/uL (ref 0.0–0.1)
Basophils Relative: 1 % (ref 0–1)
EOS ABS: 0.2 10*3/uL (ref 0.0–0.7)
Eosinophils Relative: 4 % (ref 0–5)
HCT: 36.9 % (ref 36.0–46.0)
Hemoglobin: 12.3 g/dL (ref 12.0–15.0)
LYMPHS ABS: 2.9 10*3/uL (ref 0.7–4.0)
Lymphocytes Relative: 53 % — ABNORMAL HIGH (ref 12–46)
MCH: 31.6 pg (ref 26.0–34.0)
MCHC: 33.3 g/dL (ref 30.0–36.0)
MCV: 94.9 fL (ref 78.0–100.0)
Monocytes Absolute: 0.5 10*3/uL (ref 0.1–1.0)
Monocytes Relative: 9 % (ref 3–12)
NEUTROS PCT: 33 % — AB (ref 43–77)
Neutro Abs: 1.8 10*3/uL (ref 1.7–7.7)
PLATELETS: 284 10*3/uL (ref 150–400)
RBC: 3.89 MIL/uL (ref 3.87–5.11)
RDW: 17.1 % — ABNORMAL HIGH (ref 11.5–15.5)
WBC: 5.4 10*3/uL (ref 4.0–10.5)

## 2014-01-01 LAB — HEMOGLOBIN A1C
Hgb A1c MFr Bld: 5.1 % (ref <5.7)
Mean Plasma Glucose: 100 mg/dL (ref <117)

## 2014-01-01 MED ORDER — UMECLIDINIUM-VILANTEROL 62.5-25 MCG/INH IN AEPB: INHALATION_SPRAY | RESPIRATORY_TRACT | Status: DC

## 2014-01-01 MED ORDER — TRAMADOL HCL 50 MG PO TABS: 50.0000 mg | ORAL_TABLET | Freq: Two times a day (BID) | ORAL | Status: DC | PRN

## 2014-01-01 NOTE — Patient Instructions (Signed)
Preventative Care for Adults - Female      MAINTAIN REGULAR HEALTH EXAMS:  A routine yearly physical is a good way to check in with your primary care provider about your health and preventive screening. It is also an opportunity to share updates about your health and any concerns you have, and receive a thorough all-over exam.   Most health insurance companies pay for at least some preventative services.  Check with your health plan for specific coverages.  WHAT PREVENTATIVE SERVICES DO WOMEN NEED?  Adult women should have their weight and blood pressure checked regularly.   Women age 35 and older should have their cholesterol levels checked regularly.  Women should be screened for cervical cancer with a Pap smear and pelvic exam beginning at either age 21, or 3 years after they become sexually activity.    Breast cancer screening generally begins at age 40 with a mammogram and breast exam by your primary care provider.    Beginning at age 50 and continuing to age 75, women should be screened for colorectal cancer.  Certain people may need continued testing until age 85.  Updating vaccinations is part of preventative care.  Vaccinations help protect against diseases such as the flu.  Osteoporosis is a disease in which the bones lose minerals and strength as we age. Women ages 65 and over should discuss this with their caregivers, as should women after menopause who have other risk factors.  Lab tests are generally done as part of preventative care to screen for anemia and blood disorders, to screen for problems with the kidneys and liver, to screen for bladder problems, to check blood sugar, and to check your cholesterol level.  Preventative services generally include counseling about diet, exercise, avoiding tobacco, drugs, excessive alcohol consumption, and sexually transmitted infections.    GENERAL RECOMMENDATIONS FOR GOOD HEALTH:  Healthy diet:  Eat a variety of foods, including  fruit, vegetables, animal or vegetable protein, such as meat, fish, chicken, and eggs, or beans, lentils, tofu, and grains, such as rice.  Drink plenty of water daily.  Decrease saturated fat in the diet, avoid lots of red meat, processed foods, sweets, fast foods, and fried foods.  Exercise:  Aerobic exercise helps maintain good heart health. At least 30-40 minutes of moderate-intensity exercise is recommended. For example, a brisk walk that increases your heart rate and breathing. This should be done on most days of the week.   Find a type of exercise or a variety of exercises that you enjoy so that it becomes a part of your daily life.  Examples are running, walking, swimming, water aerobics, and biking.  For motivation and support, explore group exercise such as aerobic class, spin class, Zumba, Yoga,or  martial arts, etc.    Set exercise goals for yourself, such as a certain weight goal, walk or run in a race such as a 5k walk/run.  Speak to your primary care provider about exercise goals.  Disease prevention:  If you smoke or chew tobacco, find out from your caregiver how to quit. It can literally save your life, no matter how long you have been a tobacco user. If you do not use tobacco, never begin.   Maintain a healthy diet and normal weight. Increased weight leads to problems with blood pressure and diabetes.   The Body Mass Index or BMI is a way of measuring how much of your body is fat. Having a BMI above 27 increases the risk of heart disease,   diabetes, hypertension, stroke and other problems related to obesity. Your caregiver can help determine your BMI and based on it develop an exercise and dietary program to help you achieve or maintain this important measurement at a healthful level.  High blood pressure causes heart and blood vessel problems.  Persistent high blood pressure should be treated with medicine if weight loss and exercise do not work.   Fat and cholesterol leaves  deposits in your arteries that can block them. This causes heart disease and vessel disease elsewhere in your body.  If your cholesterol is found to be high, or if you have heart disease or certain other medical conditions, then you may need to have your cholesterol monitored frequently and be treated with medication.   Ask if you should have a cardiac stress test if your history suggests this. A stress test is a test done on a treadmill that looks for heart disease. This test can find disease prior to there being a problem.  Menopause can be associated with physical symptoms and risks. Hormone replacement therapy is available to decrease these. You should talk to your caregiver about whether starting or continuing to take hormones is right for you.   Osteoporosis is a disease in which the bones lose minerals and strength as we age. This can result in serious bone fractures. Risk of osteoporosis can be identified using a bone density scan. Women ages 65 and over should discuss this with their caregivers, as should women after menopause who have other risk factors. Ask your caregiver whether you should be taking a calcium supplement and Vitamin D, to reduce the rate of osteoporosis.   Avoid drinking alcohol in excess (more than two drinks per day).  Avoid use of street drugs. Do not share needles with anyone. Ask for professional help if you need assistance or instructions on stopping the use of alcohol, cigarettes, and/or drugs.  Brush your teeth twice a day with fluoride toothpaste, and floss once a day. Good oral hygiene prevents tooth decay and gum disease. The problems can be painful, unattractive, and can cause other health problems. Visit your dentist for a routine oral and dental check up and preventive care every 6-12 months.   Look at your skin regularly.  Use a mirror to look at your back. Notify your caregivers of changes in moles, especially if there are changes in shapes, colors, a size  larger than a pencil eraser, an irregular border, or development of new moles.  Safety:  Use seatbelts 100% of the time, whether driving or as a passenger.  Use safety devices such as hearing protection if you work in environments with loud noise or significant background noise.  Use safety glasses when doing any work that could send debris in to the eyes.  Use a helmet if you ride a bike or motorcycle.  Use appropriate safety gear for contact sports.  Talk to your caregiver about gun safety.  Use sunscreen with a SPF (or skin protection factor) of 15 or greater.  Lighter skinned people are at a greater risk of skin cancer. Don't forget to also wear sunglasses in order to protect your eyes from too much damaging sunlight. Damaging sunlight can accelerate cataract formation.   Practice safe sex. Use condoms. Condoms are used for birth control and to help reduce the spread of sexually transmitted infections (or STIs).  Some of the STIs are gonorrhea (the clap), chlamydia, syphilis, trichomonas, herpes, HPV (human papilloma virus) and HIV (human immunodeficiency virus)   which causes AIDS. The herpes, HIV and HPV are viral illnesses that have no cure. These can result in disability, cancer and death.   Keep carbon monoxide and smoke detectors in your home functioning at all times. Change the batteries every 6 months or use a model that plugs into the wall.   Vaccinations:  Stay up to date with your tetanus shots and other required immunizations. You should have a booster for tetanus every 10 years. Be sure to get your flu shot every year, since 5%-20% of the [place] population comes down with the flu. The flu vaccine changes each year, so being vaccinated once is not enough. Get your shot in the fall, before the flu season peaks.   Other vaccines to consider:  Human Papilloma Virus or HPV causes cancer of the cervix, and other infections that can be transmitted from person to person. There is a vaccine for  HPV, and females should get immunized between the ages of 89 and 74. It requires a series of 3 shots.   Pneumococcal vaccine to protect against certain types of pneumonia.  This is normally recommended for adults age 64 or older.  However, adults younger than 56 years old with certain underlying conditions such as diabetes, heart or lung disease should also receive the vaccine.  Shingles vaccine to protect against Varicella Zoster if you are older than age 45, or younger than 56 years old with certain underlying illness.  Hepatitis A vaccine to protect against a form of infection of the liver by a virus acquired from food.  Hepatitis B vaccine to protect against a form of infection of the liver by a virus acquired from blood or body fluids, particularly if you work in health care.  If you plan to travel internationally, check with your local health department for specific vaccination recommendations.  Cancer Screening:  Breast cancer screening is essential to preventive care for women. All women age 16 and older should perform a breast self-exam every month. At age 48 and older, women should have their caregiver complete a breast exam each year. Women at ages 70 and older should have a mammogram (x-ray film) of the breasts. Your caregiver can discuss how often you need mammograms.    Cervical cancer screening includes taking a Pap smear (sample of cells examined under a microscope) from the cervix (end of the uterus). It also includes testing for HPV (Human Papilloma Virus, which can cause cervical cancer). Screening and a pelvic exam should begin at age 30, or 3 years after a woman becomes sexually active. Screening should occur every year, with a Pap smear but no HPV testing, up to age 22. After age 33, you should have a Pap smear every 3 years with HPV testing, if no HPV was found previously.   Most routine colon cancer screening begins at the age of 49. On a yearly basis, doctors may provide  special easy to use take-home tests to check for hidden blood in the stool. Sigmoidoscopy or colonoscopy can detect the earliest forms of colon cancer and is life saving. These tests use a small camera at the end of a tube to directly examine the colon. Speak to your caregiver about this at age 62, when routine screening begins (and is repeated every 5 years unless early forms of pre-cancerous polyps or small growths are found).    Smoking Cessation Quitting smoking is important to your health and has many advantages. However, it is not always easy to quit since  nicotine is a very addictive drug. Often times, people try 3 times or more before being able to quit. This document explains the best ways for you to prepare to quit smoking. Quitting takes hard work and a lot of effort, but you can do it. ADVANTAGES OF QUITTING SMOKING  You will live longer, feel better, and live better.  Your body will feel the impact of quitting smoking almost immediately.  Within 20 minutes, blood pressure decreases. Your pulse returns to its normal level.  After 8 hours, carbon monoxide levels in the blood return to normal. Your oxygen level increases.  After 24 hours, the chance of having a heart attack starts to decrease. Your breath, hair, and body stop smelling like smoke.  After 48 hours, damaged nerve endings begin to recover. Your sense of taste and smell improve.  After 72 hours, the body is virtually free of nicotine. Your bronchial tubes relax and breathing becomes easier.  After 2 to 12 weeks, lungs can hold more air. Exercise becomes easier and circulation improves.  The risk of having a heart attack, stroke, cancer, or lung disease is greatly reduced.  After 1 year, the risk of coronary heart disease is cut in half.  After 5 years, the risk of stroke falls to the same as a nonsmoker.  After 10 years, the risk of lung cancer is cut in half and the risk of other cancers decreases  significantly.  After 15 years, the risk of coronary heart disease drops, usually to the level of a nonsmoker.  If you are pregnant, quitting smoking will improve your chances of having a healthy baby.  The people you live with, especially any children, will be healthier.  You will have extra money to spend on things other than cigarettes. QUESTIONS TO THINK ABOUT BEFORE ATTEMPTING TO QUIT You may want to talk about your answers with your caregiver.  Why do you want to quit?  If you tried to quit in the past, what helped and what did not?  What will be the most difficult situations for you after you quit? How will you plan to handle them?  Who can help you through the tough times? Your family? Friends? A caregiver?  What pleasures do you get from smoking? What ways can you still get pleasure if you quit? Here are some questions to ask your caregiver:  How can you help me to be successful at quitting?  What medicine do you think would be best for me and how should I take it?  What should I do if I need more help?  What is smoking withdrawal like? How can I get information on withdrawal? GET READY  Set a quit date.  Change your environment by getting rid of all cigarettes, ashtrays, matches, and lighters in your home, car, or work. Do not let people smoke in your home.  Review your past attempts to quit. Think about what worked and what did not. GET SUPPORT AND ENCOURAGEMENT You have a better chance of being successful if you have help. You can get support in many ways.  Tell your family, friends, and co-workers that you are going to quit and need their support. Ask them not to smoke around you.  Get individual, group, or telephone counseling and support. Programs are available at [employer] and health centers. Call your local health department for information about programs in your area.  Spiritual beliefs and practices may help some smokers quit.  Download a "quit  meter"  on your computer to keep track of quit statistics, such as how long you have gone without smoking, cigarettes not smoked, and money saved.  Get a self-help book about quitting smoking and staying off of tobacco. Maben yourself from urges to smoke. Talk to someone, go for a walk, or occupy your time with a task.  Change your normal routine. Take a different route to work. Drink tea instead of coffee. Eat breakfast in a different place.  Reduce your stress. Take a hot bath, exercise, or read a book.  Plan something enjoyable to do every day. Reward yourself for not smoking.  Explore interactive web-based programs that specialize in helping you quit. GET MEDICINE AND USE IT CORRECTLY Medicines can help you stop smoking and decrease the urge to smoke. Combining medicine with the above behavioral methods and support can greatly increase your chances of successfully quitting smoking.  Nicotine replacement therapy helps deliver nicotine to your body without the negative effects and risks of smoking. Nicotine replacement therapy includes nicotine gum, lozenges, inhalers, nasal sprays, and skin patches. Some may be available over-the-counter and others require a prescription.  Antidepressant medicine helps people abstain from smoking, but how this works is unknown. This medicine is available by prescription.  Nicotinic receptor partial agonist medicine simulates the effect of nicotine in your brain. This medicine is available by prescription. Ask your caregiver for advice about which medicines to use and how to use them based on your health history. Your caregiver will tell you what side effects to look out for if you choose to be on a medicine or therapy. Carefully read the information on the package. Do not use any other product containing nicotine while using a nicotine replacement product.  RELAPSE OR DIFFICULT SITUATIONS Most relapses occur within the  first 3 months after quitting. Do not be discouraged if you start smoking again. Remember, most people try several times before finally quitting. You may have symptoms of withdrawal because your body is used to nicotine. You may crave cigarettes, be irritable, feel very hungry, cough often, get headaches, or have difficulty concentrating. The withdrawal symptoms are only temporary. They are strongest when you first quit, but they will go away within 10 14 days. To reduce the chances of relapse, try to:  Avoid drinking alcohol. Drinking lowers your chances of successfully quitting.  Reduce the amount of caffeine you consume. Once you quit smoking, the amount of caffeine in your body increases and can give you symptoms, such as a rapid heartbeat, sweating, and anxiety.  Avoid smokers because they can make you want to smoke.  Do not let weight gain distract you. Many smokers will gain weight when they quit, usually less than 10 pounds. Eat a healthy diet and stay active. You can always lose the weight gained after you quit.  Find ways to improve your mood other than smoking. FOR MORE INFORMATION  www.smokefree.gov  Document Released: 09/28/2001 Document Revised: 04/04/2012 Document Reviewed: 01/13/2012 [facility] Patient Information 2014 [place], [place].

## 2014-01-01 NOTE — Progress Notes (Signed)
Complete Physical HPI 58 y.o. female  presents for a complete physical. Her blood pressure has been controlled at home, today their BP is BP: 110/64 mmHg She does [place], [place] and [place] out in yard.. She denies chest pain, shortness of breath, dizziness.  She is not on cholesterol medication and denies myalgias. Her cholesterol is at goal. The cholesterol last visit was:   Lab Results  Component Value Date   CHOL 151 10/15/2013   HDL 31* 10/15/2013   LDLCALC 86 10/15/2013   TRIG 171* 10/15/2013   CHOLHDL 4.9 10/15/2013   Patient is on Vitamin D supplement.  Vit D 48 Tobacco use disorder- down to 3 cigs a day.  PUD- continue to follow up with Dr. Henrene Pastor Dr. Penelope Galas 01/02/2014- being sent for eye specialist, cataracts. Occ calf cramping when she walks 1/2 mile.   Current Medications:  Current Outpatient Prescriptions on File Prior to Visit  Medication Sig Dispense Refill  . acetaminophen (TYLENOL) 500 MG tablet Take 500 mg by mouth every 6 (six) hours as needed.        Marland Kitchen albuterol (PROVENTIL HFA;VENTOLIN HFA) 108 (90 BASE) MCG/ACT inhaler Inhale 2 puffs into the lungs every 6 (six) hours as needed for wheezing or shortness of breath.  1 Inhaler  2  . alendronate (FOSAMAX) 70 MG tablet Take 70 mg by mouth every 7 (seven) days. Take with a full glass of water on an empty stomach.      . benzonatate (TESSALON PERLES) 100 MG capsule Take 1 capsule (100 mg total) by mouth 3 (three) times daily as needed.  42 capsule  1  . Cholecalciferol (VITAMIN D) 2000 UNITS CAPS Take 7000 units a day       . ferrous sulfate 325 (65 FE) MG tablet Take 325 mg by mouth daily with breakfast.        . fexofenadine (ALLEGRA) 180 MG tablet Take 180 mg by mouth as needed.      . Fluticasone-Salmeterol (ADVAIR DISKUS) 250-50 MCG/DOSE AEPB Inhale 1 puff into the lungs every 12 (twelve) hours.      Marland Kitchen omeprazole (PRILOSEC) 40 MG capsule Take 80 mg by mouth daily.        Current  Facility-Administered Medications on File Prior to Visit  Medication Dose Route Frequency Provider Last Rate Last Dose  . 0.9 %  sodium chloride infusion  500 mL Intravenous Continuous Irene Shipper, MD       Health Maintenance:   Immunization History  Administered Date(s) Administered  . Pneumococcal-Unspecified 05/18/2001  . Td 12/16/2004    Tetanus: 2006 Pneumovax: 2002 Flu vaccine: declines Zostavax: N/A Pap: Dr. Harrington Challenger MGM: 12/2012 DEXA: 12/2012 Colonoscopy: 10/2011 EGD: -10/2011  Allergies:  Allergies  Allergen Reactions  . Celebrex [Celecoxib]     GI bleed  . Crestor [Rosuvastatin]   . Penicillins     REACTION: whelps  . Pravastatin     Muscle aches   Medical History:  Past Medical History  Diagnosis Date  . GERD (gastroesophageal reflux disease)   . Duodenal ulcer   . Internal hemorrhoids   . Hx of colonic polyps   . Arthritis   . Hyperlipemia   . Family history of malignant neoplasm of gastrointestinal tract   . Benign labile hypertension   . COPD (chronic obstructive pulmonary disease)   . Asthma   . Vitamin D deficiency    Surgical History:  Past Surgical History  Procedure Laterality Date  .  Abdominal hysterectomy    . Appendectomy    . Total hip arthroplasty      right  . Lumbar disc surgery    . Elbow surgery      rt  . Wrist surgery      left  . Hand surgery    . Esophagogastroduodenoscopy  10/2008  . Joint replacement Right 2007    hip   Family History:  Family History  Problem Relation Age of Onset  . Colon polyps Mother   . Inflammatory bowel disease Mother   . Hypertension Mother   . Stroke Mother   . Diabetes Maternal Grandfather   . Heart disease Maternal Grandfather   . Heart disease Maternal Grandmother   . Colon cancer      mat great aunt  . Hypertension Father   . Hyperlipidemia Father   . Heart disease Father   . Heart disease Brother   . Hyperlipidemia Brother    Social History:  History  Substance Use Topics   . Smoking status: Current Every Day Smoker -- 0.50 packs/day for 36 years    Types: Cigarettes  . Smokeless tobacco: Never Used     Comment: was taking chantix but it gave her a headache  . Alcohol Use: No     Review of Systems: [X]  = complains of  [ ]  = denies  General: Fatigue [ ]  Fever [ ]  Chills [ ]  Weakness [ ]   Insomnia [ ] Weight change [ ]  Night sweats [ ]   Change in appetite [ ]  Eyes: Redness [ ]  Blurred vision [ ]  Diplopia [ ]  Discharge [ ]   ENT: Congestion [ ]  Sinus Pain [ ]  Post Nasal Drip [ ]  Sore Throat [ ]  Earache [ ]  hearing loss [ ]  Tinnitus [ ]  Snoring [ ]   Cardiac: Chest pain/pressure [ ]  SOB [ ]  Orthopnea [ ]   Palpitations [ ]   Paroxysmal nocturnal dyspnea[ ]  Claudication Valu.Nieves ] Edema [ ]   Pulmonary: Cough Valu.Nieves ] Wheezing[X ]  SOB [ X]  Pleurisy [ ]   GI: Nausea [ ]  Vomiting[ ]  Dysphagia[ ]  Heartburn[ ]  Abdominal pain [ ]  Constipation [ ] ; Diarrhea [ ]  BRBPR [ ]  Melena[ ]  Bloating [ ]  Hemorrhoids [ ]   GU: Hematuria[ ]  Dysuria [ ]  Nocturia[ ]  Urgency [ ]   Hesitancy [ ]  Discharge [ ]  Frequency [ ]   Breast:  Breast lumps [ ]   nipple discharge [ ]    Neuro: Headaches[ ]  Vertigo[ ]  Paresthesias[ ]  Spasm [ ]  Speech changes [ ]  Incoordination [ ]   Ortho: Arthritis [ X] Joint pain [ ]  Muscle pain [ ]  Joint swelling [ ]  Back Pain [ ]  Skin:  Rash [ ]   Pruritis [ ]  Change in skin lesion [ ]   Psych: Depression[ ]  Anxiety[ ]  Confusion [ ]  Memory loss [ ]   Heme/Lypmh: Bleeding [ ]  Bruising [ ]  Enlarged lymph nodes [ ]   Endocrine: Visual blurring [ ]  Paresthesia [ ]  Polyuria [ ]  Polydypsea [ ]    Heat/cold intolerance [ ]  Hypoglycemia [ ]   Physical Exam: Estimated body mass index is 19.75 kg/(m^2) as calculated from the following:   Height as of this encounter: 5\' 2"  (1.575 m).   Weight as of this encounter: 108 lb (48.988 kg). BP 110/64  Pulse 84  Temp(Src) 98.5 F (36.9 C)  Resp 16  Ht 5\' 2"  (1.575 m)  Wt 108 lb (48.988 kg)  BMI 19.75 kg/m2 General Appearance: Well nourished,  in no apparent distress. Eyes:  PERRLA, EOMs, conjunctiva no swelling or erythema, normal fundi and vessels. Sinuses: No Frontal/maxillary tenderness ENT/Mouth: Ext aud canals clear, normal light reflex with TMs without erythema, bulging.  Good dentition. No erythema, swelling, or exudate on post pharynx. Tonsils not swollen or erythematous. Hearing normal.  Neck: Supple, thyroid normal. No bruits Respiratory: CXR Jan 2015 COPD, chest CT 2010 Respiratory effort normal, Decreased breath sounds in general with wheezing bilateral bases without rales, rhonchi,  or stridor. Cardio: RRR without murmurs, rubs or gallops. Brisk peripheral pulses without edema.  Chest: symmetric, with normal excursions and percussion. Breasts: defer Abdomen: Soft, +BS. Non tender, no guarding, rebound, hernias, masses, or organomegaly. .  Lymphatics: Non tender without lymphadenopathy.  Genitourinary: defer Musculoskeletal: Full ROM all peripheral extremities,5/5 strength, and normal gait. Skin: Warm, dry without rashes, lesions, ecchymosis.  Neuro: Cranial nerves intact, reflexes equal bilaterally. Normal muscle tone, no cerebellar symptoms. Sensation intact.  Psych: Awake and oriented X 3, normal affect, Insight and Judgment appropriate.   EKG: WNL no changes.  Assessment and Plan: GERD (gastroesophageal reflux disease)- cont prilosec  Duodenal ulcer- cont prilosec  Internal hemorrhoids- controlled  Hx of colonic polyps- follow up with Dr. Henrene Pastor  Arthritis- can do tylenol and Tramadol given, no NSIADS due to GI bleeds  Hyperlipemia--continue medications, check lipids, decrease fatty foods, increase activity.   Benign labile hypertension- - continue medications, DASH diet, exercise and monitor at home. Call if greater than 130/80.   COPD (chronic obstructive pulmonary disease)- 97-98% , smoking cessation- ? Exacerbation vs COPD- get on Anoro samples, script sent in and if CBC elevated will call in ABX  Asthma-  stop smoking  Vitamin D deficiency- check level  Claudication- walk, low dose ASA, and quit smoking  Discussed med's effects and SE's. Screening labs and tests as requested with regular follow-up as recommended.   Vicie Mutters 2:36 PM

## 2014-01-02 LAB — LIPID PANEL
CHOL/HDL RATIO: 5.5 ratio
CHOLESTEROL: 268 mg/dL — AB (ref 0–200)
HDL: 49 mg/dL (ref >39)
LDL Cholesterol: 188 mg/dL — ABNORMAL HIGH (ref 0–99)
TRIGLYCERIDES: 157 mg/dL — AB (ref <150)
VLDL: 31 mg/dL (ref 0–40)

## 2014-01-02 LAB — INSULIN, FASTING: Insulin fasting, serum: 3 u[IU]/mL (ref 3–28)

## 2014-01-02 LAB — IRON AND TIBC
%SAT: 13 % — ABNORMAL LOW (ref 20–55)
Iron: 60 ug/dL (ref 42–145)
TIBC: 447 ug/dL (ref 250–470)
UIBC: 387 ug/dL (ref 125–400)

## 2014-01-02 LAB — BASIC METABOLIC PANEL WITH GFR
BUN: 11 mg/dL (ref 6–23)
CALCIUM: 9.2 mg/dL (ref 8.4–10.5)
CO2: 24 meq/L (ref 19–32)
Chloride: 109 mEq/L (ref 96–112)
Creat: 0.75 mg/dL (ref 0.50–1.10)
Glucose, Bld: 86 mg/dL (ref 70–99)
Potassium: 4.5 mEq/L (ref 3.5–5.3)
SODIUM: 141 meq/L (ref 135–145)

## 2014-01-02 LAB — URINALYSIS, ROUTINE W REFLEX MICROSCOPIC
Bilirubin Urine: NEGATIVE
GLUCOSE, UA: NEGATIVE mg/dL
Hgb urine dipstick: NEGATIVE
Ketones, ur: NEGATIVE mg/dL
LEUKOCYTES UA: NEGATIVE
NITRITE: NEGATIVE
PH: 5.5 (ref 5.0–8.0)
Protein, ur: NEGATIVE mg/dL
Specific Gravity, Urine: 1.008 (ref 1.005–1.030)
Urobilinogen, UA: 0.2 mg/dL (ref 0.0–1.0)

## 2014-01-02 LAB — VITAMIN B12: VITAMIN B 12: 472 pg/mL (ref 211–911)

## 2014-01-02 LAB — HEPATIC FUNCTION PANEL
ALT: 10 U/L (ref 0–35)
AST: 17 U/L (ref 0–37)
Albumin: 4.4 g/dL (ref 3.5–5.2)
Alkaline Phosphatase: 62 U/L (ref 39–117)
BILIRUBIN TOTAL: 0.2 mg/dL (ref 0.2–1.2)
Total Protein: 6.9 g/dL (ref 6.0–8.3)

## 2014-01-02 LAB — FERRITIN: FERRITIN: 16 ng/mL (ref 10–291)

## 2014-01-02 LAB — VITAMIN D 25 HYDROXY (VIT D DEFICIENCY, FRACTURES): VIT D 25 HYDROXY: 47 ng/mL (ref 30–89)

## 2014-01-02 LAB — MICROALBUMIN / CREATININE URINE RATIO
Creatinine, Urine: 25.5 mg/dL
MICROALB UR: 0.54 mg/dL (ref 0.00–1.89)
MICROALB/CREAT RATIO: 21.2 mg/g (ref 0.0–30.0)

## 2014-01-02 LAB — HEPATITIS B SURFACE ANTIBODY,QUALITATIVE: Hep B S Ab: NEGATIVE

## 2014-01-02 LAB — MAGNESIUM: Magnesium: 2 mg/dL (ref 1.5–2.5)

## 2014-01-02 LAB — HEPATITIS C ANTIBODY: HCV Ab: NEGATIVE

## 2014-01-02 LAB — HEPATITIS A ANTIBODY, TOTAL: HEP A TOTAL AB: REACTIVE — AB

## 2014-01-02 LAB — HEPATITIS B CORE ANTIBODY, TOTAL: Hep B Core Total Ab: NONREACTIVE

## 2014-01-02 LAB — FOLATE RBC: RBC FOLATE: 535 ng/mL (ref >280)

## 2014-01-02 LAB — TSH: TSH: 1.631 u[IU]/mL (ref 0.350–4.500)

## 2014-01-03 LAB — HEPATITIS B E ANTIBODY: HEPATITIS BE ANTIBODY: NONREACTIVE

## 2014-01-25 ENCOUNTER — Other Ambulatory Visit: Payer: Self-pay | Admitting: Internal Medicine

## 2014-04-09 ENCOUNTER — Ambulatory Visit: Payer: Self-pay | Admitting: Physician Assistant

## 2014-05-17 ENCOUNTER — Ambulatory Visit: Payer: Self-pay | Admitting: Physician Assistant

## 2014-05-31 ENCOUNTER — Encounter: Payer: Self-pay | Admitting: Physician Assistant

## 2014-05-31 ENCOUNTER — Ambulatory Visit (INDEPENDENT_AMBULATORY_CARE_PROVIDER_SITE_OTHER): Payer: BC Managed Care – PPO | Admitting: Physician Assistant

## 2014-05-31 VITALS — BP 108/62 | HR 74 | Temp 98.6°F | Resp 16 | Ht 62.0 in | Wt 106.0 lb

## 2014-05-31 DIAGNOSIS — F17200 Nicotine dependence, unspecified, uncomplicated: Secondary | ICD-10-CM

## 2014-05-31 DIAGNOSIS — E559 Vitamin D deficiency, unspecified: Secondary | ICD-10-CM

## 2014-05-31 DIAGNOSIS — Z79899 Other long term (current) drug therapy: Secondary | ICD-10-CM

## 2014-05-31 DIAGNOSIS — E785 Hyperlipidemia, unspecified: Secondary | ICD-10-CM

## 2014-05-31 LAB — MAGNESIUM: Magnesium: 2.1 mg/dL (ref 1.5–2.5)

## 2014-05-31 LAB — HEPATIC FUNCTION PANEL
ALBUMIN: 4.1 g/dL (ref 3.5–5.2)
AST: 15 U/L (ref 0–37)
Alkaline Phosphatase: 61 U/L (ref 39–117)
Bilirubin, Direct: <0.1 mg/dL (ref 0.0–0.3)
TOTAL PROTEIN: 6.7 g/dL (ref 6.0–8.3)
Total Bilirubin: 0.1 mg/dL — ABNORMAL LOW (ref 0.2–1.2)

## 2014-05-31 LAB — CBC WITH DIFFERENTIAL/PLATELET
Basophils Absolute: 0.1 10*3/uL (ref 0.0–0.1)
Basophils Relative: 1 % (ref 0–1)
EOS ABS: 0.1 10*3/uL (ref 0.0–0.7)
EOS PCT: 2 % (ref 0–5)
HCT: 37.1 % (ref 36.0–46.0)
HEMOGLOBIN: 12.6 g/dL (ref 12.0–15.0)
LYMPHS ABS: 2.6 10*3/uL (ref 0.7–4.0)
Lymphocytes Relative: 47 % — ABNORMAL HIGH (ref 12–46)
MCH: 33.4 pg (ref 26.0–34.0)
MCHC: 34 g/dL (ref 30.0–36.0)
MCV: 98.4 fL (ref 78.0–100.0)
MONOS PCT: 8 % (ref 3–12)
Monocytes Absolute: 0.4 10*3/uL (ref 0.1–1.0)
Neutro Abs: 2.4 10*3/uL (ref 1.7–7.7)
Neutrophils Relative %: 42 % — ABNORMAL LOW (ref 43–77)
Platelets: 318 10*3/uL (ref 150–400)
RBC: 3.77 MIL/uL — ABNORMAL LOW (ref 3.87–5.11)
RDW: 16.4 % — ABNORMAL HIGH (ref 11.5–15.5)
WBC: 5.6 10*3/uL (ref 4.0–10.5)

## 2014-05-31 LAB — BASIC METABOLIC PANEL WITH GFR
BUN: 8 mg/dL (ref 6–23)
CO2: 21 meq/L (ref 19–32)
CREATININE: 0.7 mg/dL (ref 0.50–1.10)
Calcium: 8.9 mg/dL (ref 8.4–10.5)
Chloride: 108 mEq/L (ref 96–112)
GFR, Est African American: >89 mL/min
GFR, Est Non African American: >89 mL/min
GLUCOSE: 91 mg/dL (ref 70–99)
Potassium: 5.2 mEq/L (ref 3.5–5.3)
Sodium: 140 mEq/L (ref 135–145)

## 2014-05-31 LAB — LIPID PANEL
Cholesterol: 198 mg/dL (ref 0–200)
HDL: 49 mg/dL (ref >39)
LDL Cholesterol: 128 mg/dL — ABNORMAL HIGH (ref 0–99)
TRIGLYCERIDES: 105 mg/dL (ref <150)
Total CHOL/HDL Ratio: 4 Ratio
VLDL: 21 mg/dL (ref 0–40)

## 2014-05-31 LAB — TSH: TSH: 2.247 u[IU]/mL (ref 0.350–4.500)

## 2014-05-31 MED ORDER — TRAMADOL HCL 50 MG PO TABS: 50.0000 mg | ORAL_TABLET | Freq: Two times a day (BID) | ORAL | Status: DC | PRN

## 2014-05-31 MED ORDER — BUPROPION HCL ER (XL) 150 MG PO TB24: 150.0000 mg | ORAL_TABLET | ORAL | Status: DC

## 2014-05-31 NOTE — Patient Instructions (Addendum)
Smoking Cessation Quitting smoking is important to your health and has many advantages. However, it is not always easy to quit since nicotine is a very addictive drug. Oftentimes, people try 3 times or more before being able to quit. This document explains the best ways for you to prepare to quit smoking. Quitting takes hard work and a lot of effort, but you can do it. ADVANTAGES OF QUITTING SMOKING  You will live longer, feel better, and live better.  Your body will feel the impact of quitting smoking almost immediately.  Within 20 minutes, blood pressure decreases. Your pulse returns to its normal level.  After 8 hours, carbon monoxide levels in the blood return to normal. Your oxygen level increases.  After 24 hours, the chance of having a heart attack starts to decrease. Your breath, hair, and body stop smelling like smoke.  After 48 hours, damaged nerve endings begin to recover. Your sense of taste and smell improve.  After 72 hours, the body is virtually free of nicotine. Your bronchial tubes relax and breathing becomes easier.  After 2 to 12 weeks, lungs can hold more air. Exercise becomes easier and circulation improves.  The risk of having a heart attack, stroke, cancer, or lung disease is greatly reduced.  After 1 year, the risk of coronary heart disease is cut in half.  After 5 years, the risk of stroke falls to the same as a nonsmoker.  After 10 years, the risk of lung cancer is cut in half and the risk of other cancers decreases significantly.  After 15 years, the risk of coronary heart disease drops, usually to the level of a nonsmoker.  If you are pregnant, quitting smoking will improve your chances of having a healthy baby.  The people you live with, especially any children, will be healthier.  You will have extra money to spend on things other than cigarettes. QUESTIONS TO THINK ABOUT BEFORE ATTEMPTING TO QUIT You may want to talk about your answers with your  health care provider.  Why do you want to quit?  If you tried to quit in the past, what helped and what did not?  What will be the most difficult situations for you after you quit? How will you plan to handle them?  Who can help you through the tough times? Your family? Friends? A health care provider?  What pleasures do you get from smoking? What ways can you still get pleasure if you quit? Here are some questions to ask your health care provider:  How can you help me to be successful at quitting?  What medicine do you think would be best for me and how should I take it?  What should I do if I need more help?  What is smoking withdrawal like? How can I get information on withdrawal? GET READY  Set a quit date.  Change your environment by getting rid of all cigarettes, ashtrays, matches, and lighters in your home, car, or work. Do not let people smoke in your home.  Review your past attempts to quit. Think about what worked and what did not. GET SUPPORT AND ENCOURAGEMENT You have a better chance of being successful if you have help. You can get support in many ways.  Tell your family, friends, and coworkers that you are going to quit and need their support. Ask them not to smoke around you.  Get individual, group, or telephone counseling and support. Programs are available at [employer] and health centers.  your local health department for information about programs in your area.  Spiritual beliefs and practices may help some smokers quit.  Download a "quit meter" on your computer to keep track of quit statistics, such as how long you have gone without smoking, cigarettes not smoked, and money saved.  Get a self-help book about quitting smoking and staying off tobacco. LEARN NEW SKILLS AND BEHAVIORS  Distract yourself from urges to smoke. Talk to someone, go for a walk, or occupy your time with a task.  Change your normal routine. Take a different route to work.  Drink tea instead of coffee. Eat breakfast in a different place.  Reduce your stress. Take a hot bath, exercise, or read a book.  Plan something enjoyable to do every day. Reward yourself for not smoking.  Explore interactive web-based programs that specialize in helping you quit. GET MEDICINE AND USE IT CORRECTLY Medicines can help you stop smoking and decrease the urge to smoke. Combining medicine with the above behavioral methods and support can greatly increase your chances of successfully quitting smoking.  Nicotine replacement therapy helps deliver nicotine to your body without the negative effects and risks of smoking. Nicotine replacement therapy includes nicotine gum, lozenges, inhalers, nasal sprays, and skin patches. Some may be available over-the-counter and others require a prescription.  Antidepressant medicine helps people abstain from smoking, but how this works is unknown. This medicine is available by prescription.  Nicotinic receptor partial agonist medicine simulates the effect of nicotine in your brain. This medicine is available by prescription. Ask your health care provider for advice about which medicines to use and how to use them based on your health history. Your health care provider will tell you what side effects to look out for if you choose to be on a medicine or therapy. Carefully read the information on the package. Do not use any other product containing nicotine while using a nicotine replacement product.  RELAPSE OR DIFFICULT SITUATIONS Most relapses occur within the first 3 months after quitting. Do not be discouraged if you start smoking again. Remember, most people try several times before finally quitting. You may have symptoms of withdrawal because your body is used to nicotine. You may crave cigarettes, be irritable, feel very hungry, cough often, get headaches, or have difficulty concentrating. The withdrawal symptoms are only temporary. They are strongest  when you first quit, but they will go away within 10-14 days. To reduce the chances of relapse, try to:  Avoid drinking alcohol. Drinking lowers your chances of successfully quitting.  Reduce the amount of caffeine you consume. Once you quit smoking, the amount of caffeine in your body increases and can give you symptoms, such as a rapid heartbeat, sweating, and anxiety.  Avoid smokers because they can make you want to smoke.  Do not let weight gain distract you. Many smokers will gain weight when they quit, usually less than 10 pounds. Eat a healthy diet and stay active. You can always lose the weight gained after you quit.  Find ways to improve your mood other than smoking. FOR MORE INFORMATION  www.smokefree.gov  Document Released: 09/28/2001 Document Revised: 02/18/2014 Document Reviewed: 01/13/2012 [employer] Patient Information 2015 [employer]. This information is not intended to replace advice given to you by your health care provider. Make sure you discuss any questions you have with your health care provider. Fat and Cholesterol Control Diet Fat and cholesterol levels in your blood and organs are influenced by your diet. High levels of   fat and cholesterol may lead to diseases of the heart, small and large blood vessels, gallbladder, liver, and pancreas. CONTROLLING FAT AND CHOLESTEROL WITH DIET Although exercise and lifestyle factors are important, your diet is key. That is because certain foods are known to raise cholesterol and others to lower it. The goal is to balance foods for their effect on cholesterol and more importantly, to replace saturated and trans fat with other types of fat, such as monounsaturated fat, polyunsaturated fat, and omega-3 fatty acids. On average, a person should consume no more than 15 to 17 g of saturated fat daily. Saturated and trans fats are considered "bad" fats, and they will raise LDL cholesterol. Saturated fats are primarily found in animal  products such as meats, butter, and cream. However, that does not mean you need to give up all your favorite foods. Today, there are good tasting, low-fat, low-cholesterol substitutes for most of the things you like to eat. Choose low-fat or nonfat alternatives. Choose round or loin cuts of red meat. These types of cuts are lowest in fat and cholesterol. Chicken (without the skin), fish, veal, and ground turkey breast are great choices. Eliminate fatty meats, such as hot dogs and salami. Even shellfish have little or no saturated fat. Have a 3 oz (85 g) portion when you eat lean meat, poultry, or fish. Trans fats are also called "partially hydrogenated oils." They are oils that have been scientifically manipulated so that they are solid at room temperature resulting in a longer shelf life and improved taste and texture of foods in which they are added. Trans fats are found in stick margarine, some tub margarines, cookies, crackers, and baked goods.  When baking and cooking, oils are a great substitute for butter. The monounsaturated oils are especially beneficial since it is believed they lower LDL and raise HDL. The oils you should avoid entirely are saturated tropical oils, such as coconut and palm.  Remember to eat a lot from food groups that are naturally free of saturated and trans fat, including fish, fruit, vegetables, beans, grains (barley, rice, couscous, bulgur wheat), and pasta (without cream sauces).  IDENTIFYING FOODS THAT LOWER FAT AND CHOLESTEROL  Soluble fiber may lower your cholesterol. This type of fiber is found in fruits such as apples, vegetables such as broccoli, potatoes, and carrots, legumes such as beans, peas, and lentils, and grains such as barley. Foods fortified with plant sterols (phytosterol) may also lower cholesterol. You should eat at least 2 g per day of these foods for a cholesterol lowering effect.  Read package labels to identify low-saturated fats, trans fat free, and  low-fat foods at the supermarket. Select cheeses that have only 2 to 3 g saturated fat per ounce. Use a heart-healthy tub margarine that is free of trans fats or partially hydrogenated oil. When buying baked goods (cookies, crackers), avoid partially hydrogenated oils. Breads and muffins should be made from whole grains (whole-wheat or whole oat flour, instead of "flour" or "enriched flour"). Buy non-creamy canned soups with reduced salt and no added fats.  FOOD PREPARATION TECHNIQUES  Never deep-fry. If you must fry, either stir-fry, which uses very little fat, or use non-stick cooking sprays. When possible, broil, bake, or roast meats, and steam vegetables. Instead of putting butter or margarine on vegetables, use lemon and herbs, applesauce, and cinnamon (for squash and sweet potatoes). Use nonfat yogurt, salsa, and low-fat dressings for salads.  LOW-SATURATED FAT / LOW-FAT FOOD SUBSTITUTES Meats / Saturated Fat (g)  Avoid:   Steak, marbled (3 oz/85 g) / 11 g  Choose: Steak, lean (3 oz/85 g) / 4 g  Avoid: Hamburger (3 oz/85 g) / 7 g  Choose: Hamburger, lean (3 oz/85 g) / 5 g  Avoid: Ham (3 oz/85 g) / 6 g  Choose: Ham, lean cut (3 oz/85 g) / 2.4 g  Avoid: Chicken, with skin, dark meat (3 oz/85 g) / 4 g  Choose: Chicken, skin removed, dark meat (3 oz/85 g) / 2 g  Avoid: Chicken, with skin, light meat (3 oz/85 g) / 2.5 g  Choose: Chicken, skin removed, light meat (3 oz/85 g) / 1 g Dairy / Saturated Fat (g)  Avoid: Whole milk (1 cup) / 5 g  Choose: Low-fat milk, 2% (1 cup) / 3 g  Choose: Low-fat milk, 1% (1 cup) / 1.5 g  Choose: Skim milk (1 cup) / 0.3 g  Avoid: Hard cheese (1 oz/28 g) / 6 g  Choose: Skim milk cheese (1 oz/28 g) / 2 to 3 g  Avoid: Cottage cheese, 4% fat (1 cup) / 6.5 g  Choose: Low-fat cottage cheese, 1% fat (1 cup) / 1.5 g  Avoid: Ice cream (1 cup) / 9 g  Choose: Sherbet (1 cup) / 2.5 g  Choose: Nonfat frozen yogurt (1 cup) / 0.3 g  Choose: Frozen fruit  bar / trace  Avoid: Whipped cream (1 tbs) / 3.5 g  Choose: Nondairy whipped topping (1 tbs) / 1 g Condiments / Saturated Fat (g)  Avoid: Mayonnaise (1 tbs) / 2 g  Choose: Low-fat mayonnaise (1 tbs) / 1 g  Avoid: Butter (1 tbs) / 7 g  Choose: Extra light margarine (1 tbs) / 1 g  Avoid: Coconut oil (1 tbs) / 11.8 g  Choose: Olive oil (1 tbs) / 1.8 g  Choose: Corn oil (1 tbs) / 1.7 g  Choose: Safflower oil (1 tbs) / 1.2 g  Choose: Sunflower oil (1 tbs) / 1.4 g  Choose: Soybean oil (1 tbs) / 2.4 g  Choose: Canola oil (1 tbs) / 1 g Document Released: 10/04/2005 Document Revised: 01/29/2013 Document Reviewed: 01/02/2014 [employer] Patient Information 2015 [employer]. This information is not intended to replace advice given to you by your health care provider. Make sure you discuss any questions you have with your health care provider.  

## 2014-05-31 NOTE — Progress Notes (Signed)
Assessment and Plan:  Hypertension: Continue medication, monitor blood pressure at home. Continue DASH diet. Cholesterol: Continue diet and exercise. Check cholesterol.  Vitamin D Def- check level and continue medications.  Smoker- continue to decrease, counseled, will try wellbutrin Anxiety- will try wellbutrin  Continue diet and meds as discussed. Further disposition pending results of labs.  HPI 58 y.o. female  presents for 3 month follow up with hypertension, hyperlipidemia, prediabetes and vitamin D. Her blood pressure has been controlled at home, today their BP is BP: 108/62 mmHg She does workout. She walks. She denies chest pain, shortness of breath, dizziness.  She is not on cholesterol medication due to intolerance to statins. Her cholesterol is not at goal. The cholesterol last visit was:   Lab Results  Component Value Date   CHOL 268* 01/01/2014   HDL 49 01/01/2014   LDLCALC 188* 01/01/2014   TRIG 157* 01/01/2014   CHOLHDL 5.5 01/01/2014    Last A1C in the office was:  Lab Results  Component Value Date   HGBA1C 5.1 01/01/2014   Patient is on Vitamin D supplement.   Lab Results  Component Value Date   VD25OH 47 01/01/2014     Her husband had a stroke 2 weeks ago, and will be getting heart cath on the 24th, her father passed a month ago, her mom has been falling. She has been very stressed, having to take care of other people.  She is trying to stop smoking, she is down to 2-3 cigs a day.   Current Medications:  Current Outpatient Prescriptions on File Prior to Visit  Medication Sig Dispense Refill  . acetaminophen (TYLENOL) 500 MG tablet Take 500 mg by mouth every 6 (six) hours as needed.        Marland Kitchen albuterol (PROVENTIL HFA;VENTOLIN HFA) 108 (90 BASE) MCG/ACT inhaler Inhale 2 puffs into the lungs every 6 (six) hours as needed for wheezing or shortness of breath.  1 Inhaler  2  . alendronate (FOSAMAX) 70 MG tablet Take 70 mg by mouth every 7 (seven) days. Take with a full  glass of water on an empty stomach.      . benzonatate (TESSALON PERLES) 100 MG capsule Take 1 capsule (100 mg total) by mouth 3 (three) times daily as needed.  42 capsule  1  . Cholecalciferol (VITAMIN D) 2000 UNITS CAPS Take 7000 units a day       . ferrous sulfate 325 (65 FE) MG tablet Take 325 mg by mouth daily with breakfast.        . fexofenadine (ALLEGRA) 180 MG tablet Take 180 mg by mouth as needed.      . Fluticasone-Salmeterol (ADVAIR DISKUS) 250-50 MCG/DOSE AEPB Inhale 1 puff into the lungs every 12 (twelve) hours.      Marland Kitchen omeprazole (PRILOSEC) 40 MG capsule TAKE 1 CAPSULE TWICE A DAY  60 capsule  10  . traMADol (ULTRAM) 50 MG tablet Take 1 tablet (50 mg total) by mouth every 12 (twelve) hours as needed.  60 tablet  0  . Umeclidinium-Vilanterol (ANORO ELLIPTA) 62.5-25 MCG/INH AEPB 1 INHALATION DAILY FOR COPD  60 each  6   Current Facility-Administered Medications on File Prior to Visit  Medication Dose Route Frequency Provider Last Rate Last Dose  . 0.9 %  sodium chloride infusion  500 mL Intravenous Continuous Irene Shipper, MD       Medical History:  Past Medical History  Diagnosis Date  . GERD (gastroesophageal reflux disease)   .  Duodenal ulcer   . Internal hemorrhoids   . Hx of colonic polyps   . Arthritis   . Hyperlipemia   . Family history of malignant neoplasm of gastrointestinal tract   . Benign labile hypertension   . COPD (chronic obstructive pulmonary disease)   . Asthma   . Vitamin D deficiency    Allergies:  Allergies  Allergen Reactions  . Celebrex [Celecoxib]     GI bleed  . Crestor [Rosuvastatin]   . Penicillins     REACTION: whelps  . Pravastatin     Muscle aches     Review of Systems: [X]  = complains of  [ ]  = denies  General: Fatigue [ ]  Fever [ ]  Chills [ ]  Weakness [ ]   Insomnia [ ]  Eyes: Redness [ ]  Blurred vision [ ]  Diplopia [ ]   ENT: Congestion [ ]  Sinus Pain [ ]  Post Nasal Drip [ ]  Sore Throat [ ]  Earache [ ]   Cardiac: Chest  pain/pressure [ ]  SOB [ ]  Orthopnea [ ]   Palpitations [ ]   Paroxysmal nocturnal dyspnea[ ]  Claudication [ ]  Edema [ ]   Pulmonary: Cough [ ]  Wheezing[ ]   SOB Valu.Nieves ]  Snoring [ ]   GI: Nausea [ ]  Vomiting[ ]  Dysphagia[ ]  Heartburn[ ]  Abdominal pain [ ]  Constipation [ ] ; Diarrhea [ ] ; BRBPR [ ]  Melena[ ]  GU: Hematuria[ ]  Dysuria [ ]  Nocturia[ ]  Urgency [ ]   Hesitancy [ ]  Discharge [ ]  Neuro: Headaches[ ]  Vertigo[ ]  Paresthesias[ ]  Spasm [ ]  Speech changes [ ]  Incoordination [ ]   Ortho: Arthritis [ ]  Joint pain [ ]  Muscle pain [ ]  Joint swelling [ ]  Back Pain [ ]  Skin:  Rash [ ]   Pruritis [ ]  Change in skin lesion [ ]   Psych: Depression[ ]  Anxiety[ ]  Confusion [ ]  Memory loss [ ]   Heme/Lypmh: Bleeding [ ]  Bruising [ ]  Enlarged lymph nodes [ ]   Endocrine: Visual blurring [ ]  Paresthesia [ ]  Polyuria [ ]  Polydypsea [ ]    Heat/cold intolerance [ ]  Hypoglycemia [ ]   Family history- Review and unchanged Social history- Review and unchanged Physical Exam: BP 108/62  Pulse 74  Temp(Src) 98.6 F (37 C) (Temporal)  Resp 16  Ht 5\' 2"  (1.575 m)  Wt 106 lb (48.081 kg)  BMI 19.38 kg/m2 Wt Readings from Last 3 Encounters:  05/31/14 106 lb (48.081 kg)  01/01/14 108 lb (48.988 kg)  11/16/13 106 lb (48.081 kg)   General Appearance: underweight, barrel chested, in no apparent distress. Eyes: PERRLA, EOMs, conjunctiva no swelling or erythema Sinuses: No Frontal/maxillary tenderness ENT/Mouth: Ext aud canals clear, TMs without erythema, bulging. No erythema, swelling, or exudate on post pharynx.  Tonsils not swollen or erythematous. Hearing normal.  Neck: Supple, thyroid normal.  Respiratory: Respiratory effort normal, decreased breath sounds bilaterally without rales, rhonchi, wheezing or stridor.  Cardio: RRR with no MRGs. Brisk peripheral pulses without edema.  Abdomen: Soft, + BS.  Non tender, no guarding, rebound, hernias, masses. Lymphatics: Non tender without lymphadenopathy.  Musculoskeletal: Full  ROM, 5/5 strength, normal gait.  Skin: Warm, dry without rashes, lesions, ecchymosis.  Neuro: Cranial nerves intact. Normal muscle tone, no cerebellar symptoms. Sensation intact.  Psych: Awake and oriented X 3, normal affect, Insight and Judgment appropriate.    [name] 12:14 PM

## 2014-06-01 LAB — VITAMIN D 25 HYDROXY (VIT D DEFICIENCY, FRACTURES): VIT D 25 HYDROXY: 49 ng/mL (ref 30–89)

## 2014-06-07 ENCOUNTER — Other Ambulatory Visit: Payer: Self-pay

## 2014-06-07 MED ORDER — CITALOPRAM HYDROBROMIDE 20 MG PO TABS: 20.0000 mg | ORAL_TABLET | Freq: Every day | ORAL | Status: DC

## 2014-06-07 NOTE — Telephone Encounter (Signed)
Patient aware to take 1/2 tablet daily, and to stop Bupropion at this time

## 2014-06-13 ENCOUNTER — Encounter: Payer: Self-pay | Admitting: Internal Medicine

## 2014-07-02 ENCOUNTER — Encounter: Payer: Self-pay | Admitting: Internal Medicine

## 2014-07-02 ENCOUNTER — Ambulatory Visit (INDEPENDENT_AMBULATORY_CARE_PROVIDER_SITE_OTHER): Payer: BC Managed Care – PPO | Admitting: Internal Medicine

## 2014-07-02 VITALS — BP 110/68 | HR 76 | Temp 99.1°F | Resp 16 | Ht 62.25 in | Wt 104.0 lb

## 2014-07-02 DIAGNOSIS — J0410 Acute tracheitis without obstruction: Secondary | ICD-10-CM

## 2014-07-02 DIAGNOSIS — K259 Gastric ulcer, unspecified as acute or chronic, without hemorrhage or perforation: Secondary | ICD-10-CM

## 2014-07-02 DIAGNOSIS — K648 Other hemorrhoids: Secondary | ICD-10-CM

## 2014-07-02 DIAGNOSIS — E785 Hyperlipidemia, unspecified: Secondary | ICD-10-CM

## 2014-07-02 DIAGNOSIS — J45909 Unspecified asthma, uncomplicated: Secondary | ICD-10-CM

## 2014-07-02 DIAGNOSIS — K269 Duodenal ulcer, unspecified as acute or chronic, without hemorrhage or perforation: Secondary | ICD-10-CM

## 2014-07-02 DIAGNOSIS — K219 Gastro-esophageal reflux disease without esophagitis: Secondary | ICD-10-CM

## 2014-07-02 DIAGNOSIS — D126 Benign neoplasm of colon, unspecified: Secondary | ICD-10-CM

## 2014-07-02 MED ORDER — PREDNISONE 20 MG PO TABS: ORAL_TABLET | ORAL | Status: DC

## 2014-07-02 MED ORDER — HYDROCODONE-ACETAMINOPHEN 5-325 MG PO TABS: ORAL_TABLET | ORAL | Status: DC

## 2014-07-02 MED ORDER — AZITHROMYCIN 250 MG PO TABS: ORAL_TABLET | ORAL | Status: DC

## 2014-07-02 NOTE — Progress Notes (Signed)
Subjective:    Patient ID: [name] Reason, female    DOB: 07/08/1956, 58 y.o.   MRN: [id number]  HPI very nice 58 yo MWF w/ COPD/Asthma Presents with 3 da hx/o congestion and productive cough . No fever or chills. Sputum is white to yellow. Sl hoarseness. Alleges quitting smoking again about 2 weeks ago.    Medication List     acetaminophen 500 MG tablet  Commonly known as:  TYLENOL  Take 500 mg by mouth every 6 (six) hours as needed.     ADVAIR DISKUS 250-50 MCG/DOSE Aepb  Generic drug:  Fluticasone-Salmeterol  Inhale 1 puff into the lungs every 12 (twelve) hours.     albuterol 108 (90 BASE) MCG/ACT inhaler  Commonly known as:  PROVENTIL HFA;VENTOLIN HFA  Inhale 2 puffs into the lungs every 6 (six) hours as needed for wheezing or shortness of breath.     alendronate 70 MG tablet  Commonly known as:  FOSAMAX  Take 70 mg by mouth every 7 (seven) days. Take with a full glass of water on an empty stomach.     buPROPion 150 MG 24 hr tablet  Commonly known as:  WELLBUTRIN XL  Take 1 tablet (150 mg total) by mouth every morning.     citalopram 20 MG tablet  Commonly known as:  CELEXA  Take 1 tablet (20 mg total) by mouth daily.     ferrous sulfate 325 (65 FE) MG tablet  Take 325 mg by mouth daily with breakfast.     fexofenadine 180 MG tablet  Commonly known as:  ALLEGRA  Take 180 mg by mouth as needed.     omeprazole 40 MG capsule  Commonly known as:  PRILOSEC  TAKE 1 CAPSULE TWICE A DAY     traMADol 50 MG tablet  Commonly known as:  ULTRAM  Take 1 tablet (50 mg total) by mouth every 12 (twelve) hours as needed.     Vitamin D 2000 UNITS Caps  Take 7000 units a day       Allergies  Allergen Reactions  . Celebrex [Celecoxib]     GI bleed  . Crestor [Rosuvastatin]   . Penicillins     REACTION: whelps  . Pravastatin     Muscle aches   Past Medical History  Diagnosis Date  . GERD (gastroesophageal reflux disease)   . Duodenal ulcer   . Internal hemorrhoids    . Hx of colonic polyps   . Arthritis   . Hyperlipemia   . Family history of malignant neoplasm of gastrointestinal tract   . Benign labile hypertension   . COPD (chronic obstructive pulmonary disease)   . Asthma   . Vitamin D deficiency    Review of Systems In addition to the HPI above,  No Fever-chills,  No Headache, No changes with Vision or hearing,  No problems swallowing food or Liquids,  No Chest pain,  No Abdominal pain, No Nausea or Vomitting, Bowel movements are regular,  No Blood in stool or Urine,  No dysuria,  No new skin rashes or bruises,  No new joints pains-aches,  No new weakness, tingling, numbness in any extremity,  No recent weight loss,  No polyuria, polydypsia or polyphagia,  No significant Mental Stressors.  A full 10 point Review of Systems was done, except as stated above, all other Review of Systems were negative  Objective:   Physical Exam  BP 110/68  Pulse 76  Temp(Src) 99.1 F (37.3 C)  Resp 16  Ht 5' 2.25" (1.581 m)  Wt 104 lb (47.174 kg)  BMI 18.87 kg/m2  HEENT - Eac's patent. TM's Nl. EOM's full. PERRLA. NasoOroPharynx clear. Neck - supple. Nl Thyroid. Carotids 2+ & No bruits, nodes, JVD Chest - Distant BS w/scattered Rales, rhonchi and wheezes.O2 Sat 94% at rest Cor - Nl HS. RRR w/o sig MGR. PP 1(+). No edema. Abd - No palpable organomegaly, masses or tenderness. BS nl. MS- FROM w/o deformities. Muscle power, tone and bulk Nl. Gait Nl. Neuro - No obvious Cr N abnormalities. Sensory, motor and Cerebellar functions appear Nl w/o focal abnormalities. Psyche - Mental status normal & appropriate.  No delusions, ideations or obvious mood abnormalities.  Assessment & Plan:   1. Acute tracheitis without mention of obstruction - Rx Zpak,Prednisone taper and Norco for cough  - Discussed Meds & SE's and ROV prn  2. Intrinsic asthma, unspecified

## 2014-07-08 ENCOUNTER — Other Ambulatory Visit: Payer: Self-pay | Admitting: Internal Medicine

## 2014-08-14 ENCOUNTER — Encounter: Payer: Self-pay | Admitting: Physician Assistant

## 2014-08-14 ENCOUNTER — Ambulatory Visit (INDEPENDENT_AMBULATORY_CARE_PROVIDER_SITE_OTHER): Payer: BC Managed Care – PPO | Admitting: Physician Assistant

## 2014-08-14 VITALS — BP 130/78 | HR 100 | Temp 98.6°F | Resp 16 | Ht 62.5 in | Wt 105.0 lb

## 2014-08-14 DIAGNOSIS — Z87891 Personal history of nicotine dependence: Secondary | ICD-10-CM

## 2014-08-14 DIAGNOSIS — R350 Frequency of micturition: Secondary | ICD-10-CM

## 2014-08-14 DIAGNOSIS — R1013 Epigastric pain: Secondary | ICD-10-CM

## 2014-08-14 LAB — BASIC METABOLIC PANEL WITH GFR
BUN: 16 mg/dL (ref 6–23)
CHLORIDE: 101 meq/L (ref 96–112)
CO2: 24 mEq/L (ref 19–32)
Calcium: 9.8 mg/dL (ref 8.4–10.5)
Creat: 0.58 mg/dL (ref 0.50–1.10)
GFR, Est Non African American: >89 mL/min
Glucose, Bld: 94 mg/dL (ref 70–99)
POTASSIUM: 5.1 meq/L (ref 3.5–5.3)
Sodium: 137 mEq/L (ref 135–145)

## 2014-08-14 LAB — CBC WITH DIFFERENTIAL/PLATELET
BASOS ABS: 0.1 10*3/uL (ref 0.0–0.1)
Basophils Relative: 1 % (ref 0–1)
Eosinophils Absolute: 0.2 10*3/uL (ref 0.0–0.7)
Eosinophils Relative: 2 % (ref 0–5)
HCT: 38.3 % (ref 36.0–46.0)
Hemoglobin: 13 g/dL (ref 12.0–15.0)
LYMPHS PCT: 38 % (ref 12–46)
Lymphs Abs: 3.4 10*3/uL (ref 0.7–4.0)
MCH: 32.3 pg (ref 26.0–34.0)
MCHC: 33.9 g/dL (ref 30.0–36.0)
MCV: 95 fL (ref 78.0–100.0)
Monocytes Absolute: 1.2 10*3/uL — ABNORMAL HIGH (ref 0.1–1.0)
Monocytes Relative: 13 % — ABNORMAL HIGH (ref 3–12)
NEUTROS ABS: 4.1 10*3/uL (ref 1.7–7.7)
NEUTROS PCT: 46 % (ref 43–77)
PLATELETS: 351 10*3/uL (ref 150–400)
RBC: 4.03 MIL/uL (ref 3.87–5.11)
RDW: 14.9 % (ref 11.5–15.5)
WBC: 9 10*3/uL (ref 4.0–10.5)

## 2014-08-14 LAB — HEPATIC FUNCTION PANEL
AST: 13 U/L (ref 0–37)
Albumin: 4.4 g/dL (ref 3.5–5.2)
Alkaline Phosphatase: 63 U/L (ref 39–117)
TOTAL PROTEIN: 7.2 g/dL (ref 6.0–8.3)
Total Bilirubin: 0.2 mg/dL (ref 0.2–1.2)

## 2014-08-14 LAB — AMYLASE: Amylase: 47 U/L (ref 0–105)

## 2014-08-14 MED ORDER — ONDANSETRON HCL 4 MG PO TABS: 4.0000 mg | ORAL_TABLET | Freq: Every day | ORAL | Status: DC | PRN

## 2014-08-14 MED ORDER — CIPROFLOXACIN HCL 500 MG PO TABS: 500.0000 mg | ORAL_TABLET | Freq: Two times a day (BID) | ORAL | Status: AC

## 2014-08-14 MED ORDER — SUCRALFATE 1 G PO TABS: ORAL_TABLET | ORAL | Status: DC

## 2014-08-14 NOTE — Progress Notes (Signed)
Subjective:    Patient ID: [name] Reason, female    DOB: 1956-04-13, 58 y.o.   MRN: [id number]  Dysuria  Associated symptoms include chills, flank pain, frequency, nausea, urgency and vomiting. Pertinent negatives include no hematuria.   58 y.o. presents with lower back pain and urinary frequency. Woke up saturday with lower back pain that travels up into her ribs, better with curling up and being still, worse lying down, nausea, vomiting, low grade temperature. She slept most of Saturday, felt better Sunday, but then Monday she woke up with nausea, frequency, she has also been beltching.   Review of Systems  Constitutional: Positive for fever, chills and fatigue. Negative for diaphoresis, activity change, appetite change and unexpected weight change.  HENT: Negative.   Respiratory: Negative.   Cardiovascular: Negative.   Gastrointestinal: Positive for nausea, vomiting and abdominal pain (epigastric). Negative for diarrhea and constipation.  Genitourinary: Positive for urgency, frequency and flank pain. Negative for dysuria, hematuria, decreased urine volume, vaginal bleeding, vaginal discharge, genital sores, vaginal pain, menstrual problem and pelvic pain.  Musculoskeletal: Positive for back pain. Negative for arthralgias, gait problem, joint swelling, myalgias and neck stiffness.  Skin: Negative.  Negative for rash.       Objective:   Physical Exam  Constitutional: She is oriented to person, place, and time. She appears well-developed and well-nourished. No distress.  Cardiovascular: Regular rhythm, normal heart sounds and intact distal pulses.   Tachycardia, equal BP bilateral arms  Pulmonary/Chest: Effort normal and breath sounds normal. No respiratory distress. She has no wheezes.  Abdominal: Soft. Bowel sounds are normal. She exhibits no distension. There is tenderness (epigastric, ? pusitile mass- with right lower quadrant pain and right flank pain). There is guarding. There is  no rebound.  Neurological: She is alert and oriented to person, place, and time.  Skin: Skin is warm and dry. No rash noted.       Assessment & Plan:  Frequency/flank pain with smoking history- get UA, C&S, Cipro sent in  Nausea/epigastric pain- increase prilosec BID, add carafate with history of PUD, zofran, check amalyse, hylori, CBC, CMET Epigastric pain/pulsitile mass with history of smoking and abnormal aorta scan- vitals normal, will get AB [place], declines ER, will go to the ER if any change in pain, SOB, CP, dizziness

## 2014-08-14 NOTE — Patient Instructions (Signed)
Urinary Frequency The number of times a normal person urinates depends upon how much liquid they take in and how much liquid they are losing. If the temperature is hot and there is high humidity, then the person will sweat more and usually breathe a little more frequently. These factors decrease the amount of frequency of urination that would be considered normal. The amount you drink is easily determined, but the amount of fluid lost is sometimes more difficult to calculate.  Fluid is lost in two ways:  Sensible fluid loss is usually measured by the amount of urine that you get rid of. Losses of fluid can also occur with diarrhea.  Insensible fluid loss is more difficult to measure. It is caused by evaporation. Insensible loss of fluid occurs through breathing and sweating. It usually ranges from a little less than a quart to a little more than a quart of fluid a day. In normal temperatures and activity levels, the average person may urinate 4 to 7 times in a 24-hour period. Needing to urinate more often than that could indicate a problem. If one urinates 4 to 7 times in 24 hours and has large volumes each time, that could indicate a different problem from one who urinates 4 to 7 times a day and has small volumes. The time of urinating is also important. Most urinating should be done during the waking hours. Getting up at night to urinate frequently can indicate some problems. CAUSES  The bladder is the organ in your lower abdomen that holds urine. Like a balloon, it swells some as it fills up. Your nerves sense this and tell you it is time to head for the bathroom. There are a number of reasons that you might feel the need to urinate more often than usual. They include:  Urinary tract infection. This is usually associated with other signs such as burning when you urinate.  In men, problems with the prostate (a walnut-size gland that is located near the tube that carries urine out of your body). There  are two reasons why the prostate can cause an increased frequency of urination:  An enlarged prostate that does not let the bladder empty well. If the bladder only half empties when you urinate, then it only has half the capacity to fill before you have to urinate again.  The nerves in the bladder become more hypersensitive with an increased size of the prostate even if the bladder empties completely.  Pregnancy.  Obesity. Excess weight is more likely to cause a problem for women than for men.  Bladder stones or other bladder problems.  Caffeine.  Alcohol.  Medications. For example, drugs that help the body get rid of extra fluid (diuretics) increase urine production. Some other medicines must be taken with lots of fluids.  Muscle or nerve weakness. This might be the result of a spinal cord injury, a stroke, multiple sclerosis, or Parkinson disease.  Long-standing diabetes can decrease the sensation of the bladder. This loss of sensation makes it harder to sense the bladder needs to be emptied. Over a period of years, the bladder is stretched out by constant overfilling. This weakens the bladder muscles so that the bladder does not empty well and has less capacity to fill with new urine.  Interstitial cystitis (also called painful bladder syndrome). This condition develops because the tissues that line the inside of the bladder are inflamed (inflammation is the body's way of reacting to injury or infection). It causes pain and frequent   urination. It occurs in women more often than in men. DIAGNOSIS   To decide what might be causing your urinary frequency, your health care provider will probably:  Ask about symptoms you have noticed.  Ask about your overall health. This will include questions about any medications you are taking.  Do a physical examination.  Order some tests. These might include:  A blood test to check for diabetes or other health issues that could be contributing  to the problem.  Urine testing. This could measure the flow of urine and the pressure on the bladder.  A test of your neurological system (the brain, spinal cord, and nerves). This is the system that senses the need to urinate.  A bladder test to check whether it is emptying completely when you urinate.  Cystoscopy. This test uses a thin tube with a tiny camera on it. It offers a look inside your urethra and bladder to see if there are problems.  Imaging tests. You might be given a contrast dye and then asked to urinate. X-rays are taken to see how your bladder is working. TREATMENT  It is important for you to be evaluated to determine if the amount or frequency that you have is unusual or abnormal. If it is found to be abnormal, the cause should be determined and this can usually be found out easily. Depending upon the cause, treatment could include medication, stimulation of the nerves, or surgery. There are not too many things that you can do as an individual to change your urinary frequency. It is important that you balance the amount of fluid intake needed to compensate for your activity and the temperature. Medical problems will be diagnosed and taken care of by your physician. There is no particular bladder training such as Kegel exercises that you can do to help urinary frequency. This is an exercise that is usually recommended for people who have leaking of urine when they laugh, cough, or sneeze. HOME CARE INSTRUCTIONS   Take any medications your health care provider prescribed or suggested. Follow the directions carefully.  Practice any lifestyle changes that are recommended. These might include:  Drinking less fluid or drinking at different times of the day. If you need to urinate often during the night, for example, you may need to stop drinking fluids early in the evening.  Cutting down on caffeine or alcohol. They both can make you need to urinate more often than normal. Caffeine  is found in coffee, tea, and sodas.  Losing weight, if that is recommended.  Keep a journal or a log. You might be asked to record how much you drink and when and where you feel the need to urinate. This will also help evaluate how well the treatment provided by your physician is working. SEEK MEDICAL CARE IF:   Your need to urinate often gets worse.  You feel increased pain or irritation when you urinate.  You notice blood in your urine.  You have questions about any medications that your health care provider recommended.  You notice blood, pus, or swelling at the site of any test or treatment procedure.  You develop a fever of more than 100.5F (38.1C). SEEK IMMEDIATE MEDICAL CARE IF:  You develop a fever of more than 102.0F (38.9C). Document Released: 07/31/2009 Document Revised: 02/18/2014 Document Reviewed: 07/31/2009 [employer] Patient Information 2015 [employer]. This information is not intended to replace advice given to you by your health care provider. Make sure you discuss any questions you   have with your health care provider. Peptic Ulcer A peptic ulcer is a sore in the lining of your esophagus (esophageal ulcer), stomach (gastric ulcer), or in the first part of your small intestine (duodenal ulcer). The ulcer causes erosion into the deeper tissue. CAUSES  Normally, the lining of the stomach and the small intestine protects itself from the acid that digests food. The protective lining can be damaged by:  An infection caused by a bacterium called Helicobacter pylori (H. pylori).  Regular use of nonsteroidal anti-inflammatory drugs (NSAIDs), such as ibuprofen or aspirin.  Smoking tobacco. Other risk factors include being older than 28, drinking alcohol excessively, and having a family history of ulcer disease.  SYMPTOMS   Burning pain or gnawing in the area between the chest and the belly button.  Heartburn.  Nausea and vomiting.  Bloating. The pain can be  worse on an empty stomach and at night. If the ulcer results in bleeding, it can cause:  Black, tarry stools.  Vomiting of bright red blood.  Vomiting of coffee-ground-looking materials. DIAGNOSIS  A diagnosis is usually made based upon your history and an exam. Other tests and procedures may be performed to find the cause of the ulcer. Finding a cause will help determine the best treatment. Tests and procedures may include:  Blood tests, stool tests, or breath tests to check for the bacterium H. pylori.  An upper gastrointestinal (GI) series of the esophagus, stomach, and small intestine.  An endoscopy to examine the esophagus, stomach, and small intestine.  A biopsy. TREATMENT  Treatment may include:  Eliminating the cause of the ulcer, such as smoking, NSAIDs, or alcohol.  Medicines to reduce the amount of acid in your digestive tract.  Antibiotic medicines if the ulcer is caused by the H. pylori bacterium.  An upper endoscopy to treat a bleeding ulcer.  Surgery if the bleeding is severe or if the ulcer created a hole somewhere in the digestive system. HOME CARE INSTRUCTIONS   Avoid tobacco, alcohol, and caffeine. Smoking can increase the acid in the stomach, and continued smoking will impair the healing of ulcers.  Avoid foods and drinks that seem to cause discomfort or aggravate your ulcer.  Only take medicines as directed by your caregiver. Do not substitute over-the-counter medicines for prescription medicines without talking to your caregiver.  Keep any follow-up appointments and tests as directed. SEEK MEDICAL CARE IF:   Your do not improve within 7 days of starting treatment.  You have ongoing indigestion or heartburn. SEEK IMMEDIATE MEDICAL CARE IF:   You have sudden, sharp, or persistent abdominal pain.  You have bloody or dark black, tarry stools.  You vomit blood or vomit that looks like coffee grounds.  You become light-headed, weak, or feel  faint.  You become sweaty or clammy. MAKE SURE YOU:   Understand these instructions.  Will watch your condition.  Will get help right away if you are not doing well or get worse. Document Released: 10/01/2000 Document Revised: 02/18/2014 Document Reviewed: 05/03/2012 [facility] Patient Information 2015 [place], [place]. This information is not intended to replace advice given to you by your health care provider. Make sure you discuss any questions you have with your health care provider.

## 2014-08-15 ENCOUNTER — Telehealth: Payer: Self-pay

## 2014-08-15 LAB — URINALYSIS, ROUTINE W REFLEX MICROSCOPIC
BILIRUBIN URINE: NEGATIVE
GLUCOSE, UA: NEGATIVE mg/dL
HGB URINE DIPSTICK: NEGATIVE
Ketones, ur: NEGATIVE mg/dL
Leukocytes, UA: NEGATIVE
Nitrite: NEGATIVE
PROTEIN: NEGATIVE mg/dL
Specific Gravity, Urine: 1.006 (ref 1.005–1.030)
UROBILINOGEN UA: 0.2 mg/dL (ref 0.0–1.0)
pH: 6.5 (ref 5.0–8.0)

## 2014-08-15 NOTE — Telephone Encounter (Signed)
Patient aware of US abdomen Wednesday , November 4 @ 9 am, patient aware nothing to eat or drink after midnight prior to appt.

## 2014-08-16 LAB — HELICOBACTER PYLORI ABS-IGG+IGA, BLD
H Pylori IgG: 0.48 {ISR}
HELICOBACTER PYLORI AB, IGA: 5.5 U/mL (ref <9.0)

## 2014-08-16 LAB — URINE CULTURE
Colony Count: NO GROWTH
Organism ID, Bacteria: NO GROWTH

## 2014-08-19 ENCOUNTER — Other Ambulatory Visit: Payer: Self-pay

## 2014-08-19 MED ORDER — BUPROPION HCL ER (XL) 150 MG PO TB24: 150.0000 mg | ORAL_TABLET | ORAL | Status: DC

## 2014-08-21 ENCOUNTER — Ambulatory Visit
Admission: RE | Admit: 2014-08-21 | Discharge: 2014-08-21 | Disposition: A | Discharge status: Home / Self Care | Payer: BC Managed Care – PPO | Source: Ambulatory Visit | Attending: Physician Assistant | Admitting: Physician Assistant

## 2014-08-21 DIAGNOSIS — Z87891 Personal history of nicotine dependence: Secondary | ICD-10-CM

## 2014-08-21 DIAGNOSIS — R1013 Epigastric pain: Secondary | ICD-10-CM

## 2014-08-30 ENCOUNTER — Encounter: Payer: Self-pay | Admitting: Physician Assistant

## 2014-08-30 ENCOUNTER — Ambulatory Visit: Payer: Self-pay | Admitting: Physician Assistant

## 2014-08-30 ENCOUNTER — Ambulatory Visit (INDEPENDENT_AMBULATORY_CARE_PROVIDER_SITE_OTHER): Payer: BC Managed Care – PPO | Admitting: Physician Assistant

## 2014-08-30 VITALS — BP 122/80 | HR 80 | Temp 98.2°F | Resp 18 | Ht 62.25 in | Wt 104.0 lb

## 2014-08-30 DIAGNOSIS — K810 Acute cholecystitis: Secondary | ICD-10-CM

## 2014-08-30 DIAGNOSIS — R1013 Epigastric pain: Secondary | ICD-10-CM

## 2014-08-30 MED ORDER — ACETAMINOPHEN-CODEINE #3 300-30 MG PO TABS: 1.0000 | ORAL_TABLET | Freq: Four times a day (QID) | ORAL | Status: DC | PRN

## 2014-08-30 NOTE — Progress Notes (Signed)
Subjective:    Patient ID: Ruben Reason, female    DOB: May 15, 1956, 57 y.o.   MRN: [id number]  HPI A 58yo Caucasian female presents to the office today to go over ultrasound results and discuss possible referral to general surgeon to have gallbladder removed.  Patient states the abdominal pain started 3 weeks ago and is located in epigastric area and shoots straight through to back.  She states the pain comes and goes and is sharp and a 7 out of 10 right now.  She states the pain is alleviated by curling herself into a ball and sitting real still.  She states she saw Dr. Marina Goodell (GI) in the past and has not been there is years.  She denies a temporal pattern to the pain.  She reports nausea and increased belching.  She is currently taking Zofran 4mg  for the nausea and it does help.  She is taking Prilosec 40mg  daily for GERD and states it does not help with the belching.  Patient states the Carafate 1G is not helping with the pain.  At her last visit on 08/14/14 "There is tenderness (epigastric, ? pusitile mass- with right lower quadrant pain and right flank pain). There is guarding. There is no rebound."   Ultrasound Results on 08/21/14-  IMPRESSION:  Probable tumefactive sludge seen within gallbladder lumen. No other significant abnormality seen in the abdomen  Lab Results  Component Value Date   ALT <8 08/14/2014   AST 13 08/14/2014   ALKPHOS 63 08/14/2014   BILITOT 0.2 08/14/2014   Lab Results  Component Value Date   NA 137 08/14/2014   K 5.1 08/14/2014   CL 101 08/14/2014   CO2 24 08/14/2014   Lab Results  Component Value Date   CREATININE 0.58 08/14/2014  GFR >89 on 08/14/14 BUN-16 on 08/14/14 Calcium- 9.8 on 08/14/14 Lab Results  Component Value Date   AMYLASE 47 08/14/2014  UA- normal on 08/14/14  Urine Culture- Negative on 08/14/14 Review of Systems  Constitutional: Positive for chills and appetite change. Negative for fever, diaphoresis and fatigue.  Eyes: Negative.    Respiratory: Positive for shortness of breath. Negative for cough, chest tightness and wheezing.   Cardiovascular: Positive for chest pain.       Chest pain when abdominal pain occurs.  Gastrointestinal: Positive for nausea and abdominal pain. Negative for vomiting, diarrhea, constipation, blood in stool, abdominal distention, anal bleeding and rectal pain.  Endocrine: Negative.   Genitourinary: Positive for frequency. Negative for dysuria, urgency, flank pain and pelvic pain.  Musculoskeletal: Positive for back pain.  Skin: Negative.   Allergic/Immunologic: Negative.   Neurological: Positive for headaches. Negative for dizziness, syncope, weakness and light-headedness.  Hematological: Negative.   Psychiatric/Behavioral: Negative.    Past Medical History  Diagnosis Date  . GERD (gastroesophageal reflux disease)   . Duodenal ulcer   . Internal hemorrhoids   . Hx of colonic polyps   . Arthritis   . Hyperlipemia   . Family history of malignant neoplasm of gastrointestinal tract   . Benign labile hypertension   . COPD (chronic obstructive pulmonary disease)   . Asthma   . Vitamin D deficiency    Current Outpatient Prescriptions on File Prior to Visit  Medication Sig Dispense Refill  . acetaminophen (TYLENOL) 500 MG tablet Take 500 mg by mouth every 6 (six) hours as needed.      Marland Kitchen albuterol (PROVENTIL HFA;VENTOLIN HFA) 108 (90 BASE) MCG/ACT inhaler Inhale 2 puffs into the lungs every 6 (  six) hours as needed for wheezing or shortness of breath. 1 Inhaler 2  . alendronate (FOSAMAX) 70 MG tablet Take 70 mg by mouth every 7 (seven) days. Take with a full glass of water on an empty stomach.    Marland Kitchen buPROPion (WELLBUTRIN XL) 150 MG 24 hr tablet Take 1 tablet (150 mg total) by mouth every morning. 30 tablet 3  . Cholecalciferol (VITAMIN D) 2000 UNITS CAPS Take 7000 units a day     . ferrous sulfate 325 (65 FE) MG tablet Take 325 mg by mouth daily with breakfast.      . fexofenadine (ALLEGRA)  180 MG tablet Take 180 mg by mouth as needed.    . Fluticasone-Salmeterol (ADVAIR DISKUS) 250-50 MCG/DOSE AEPB Inhale 1 puff into the lungs every 12 (twelve) hours.    Marland Kitchen omeprazole (PRILOSEC) 40 MG capsule TAKE 1 CAPSULE TWICE A DAY 60 capsule 10  . ondansetron (ZOFRAN) 4 MG tablet Take 1 tablet (4 mg total) by mouth daily as needed for nausea or vomiting. 30 tablet 1  . sucralfate (CARAFATE) 1 G tablet 1 pill before meals 2-3 times daily as needed for pain 60 tablet 0   No current facility-administered medications on file prior to visit.   Allergies  Allergen Reactions  . Celebrex [Celecoxib]     GI bleed  . Crestor [Rosuvastatin]   . Penicillins     REACTION: whelps  . Pravastatin     Muscle aches     BP 122/80 mmHg  Pulse 80  Temp(Src) 98.2 F (36.8 C) (Temporal)  Resp 18  Ht 5' 2.25" (1.581 m)  Wt 104 lb (47.174 kg)  BMI 18.87 kg/m2  SpO2 99% Wt Readings from Last 3 Encounters:  08/30/14 104 lb (47.174 kg)  08/14/14 105 lb (47.628 kg)  07/02/14 104 lb (47.174 kg)   Objective:   Physical Exam  Constitutional: She is oriented to person, place, and time. She appears well-developed and well-nourished. No distress.  HENT:  Head: Normocephalic.  Nose: Nose normal.  Mouth/Throat: Uvula is midline, oropharynx is clear and moist and mucous membranes are normal. Mucous membranes are not pale and not dry. No trismus in the jaw. No uvula swelling. No oropharyngeal exudate, posterior oropharyngeal edema, posterior oropharyngeal erythema or tonsillar abscesses.  Eyes: Conjunctivae, EOM and lids are normal. Pupils are equal, round, and reactive to light. Right eye exhibits no discharge. Left eye exhibits no discharge. No scleral icterus.  Neck: Trachea normal, normal range of motion and phonation normal. Neck supple. No thyroid mass and no thyromegaly present.  Cardiovascular: Normal rate, regular rhythm, S1 normal, S2 normal, normal heart sounds, intact distal pulses and normal pulses.   Exam reveals no gallop, no distant heart sounds and no friction rub.   No murmur heard. Pulmonary/Chest: Effort normal and breath sounds normal. No respiratory distress. She has no decreased breath sounds. She has no wheezes. She has no rhonchi. She has no rales. She exhibits no tenderness.  Abdominal: Soft. Normal appearance and bowel sounds are normal. She exhibits pulsatile midline mass and mass. She exhibits no shifting dullness, no distension, no pulsatile liver, no fluid wave, no abdominal bruit and no ascites. There is no hepatosplenomegaly. There is tenderness in the right upper quadrant and epigastric area. There is positive Murphy's sign. There is no rigidity, no rebound, no guarding and no CVA tenderness. No hernia.  Pulsatile mass measures 6cm on exam just above umbilicus.  However, recent ultrasound report states "Abdominal aorta: No aneurysm visualized  and IVC: No abnormality visualized. " on 08/21/14.  Musculoskeletal: Normal range of motion.  Lymphadenopathy:  No LAD.  Neurological: She is alert and oriented to person, place, and time. She has normal strength. No cranial nerve deficit.  Skin: Skin is warm, dry and intact. No rash noted. She is not diaphoretic.  Psychiatric: She has a normal mood and affect. Her speech is normal and behavior is normal. Thought content normal. Cognition and memory are normal.  Vitals reviewed.     Assessment & Plan:  1. Cholecystitis, acute- Probable Tumefactive Sludge in gallbladder on ultrasound report from 08/21/14 -Urgent Ambulatory referral to General Surgery to evaluate pt for possible cholecystectomy.  Darylene Price in referrals this is urgent due to pt in severe pain. - Stop taking Tylenol OTC and take the Tylenol #3 for moderate to severe pain. -Take Tylenol #3 for moderate to severe pain.- acetaminophen-codeine (TYLENOL #3) 300-30 MG per tablet; Take 1 tablet by mouth every 6 (six) hours as needed for moderate pain. MDD: 4 tablets per day   Dispense: 40 tablet; Refill: 0  2. Abdominal pain, epigastric -Urgent Ambulatory referral to General Surgery to evaluate pt for possible cholescystectomy  Discussed medication effects and SE's.  Pt agreed to treatment plan.  If you develop fever, chills, night sweats, CP, SOB, dizziness, headache, weakness, worsening abdominal pain, diarrhea, constipation, blood in stools, then go to the ER immediately.  Mardella Layman will call you with your appointment date, time and place to go see the general surgeon. If you have any questions, then please call the office.  Trystyn Dolley, Lise Auer, PA-C 11:10 AM [facility]

## 2014-08-30 NOTE — Patient Instructions (Signed)
-  Mendel Ryder will call you to let you know when the appointment with general surgeon is. -Continue Zofran and Prilosec as perscribed. -Continue BRAT diet- bananas, rice, applesauce, toast.    Please follow up with Dr. Melford Aase in December.  Cholecystitis Cholecystitis is an inflammation of your gallbladder. It is usually caused by a buildup of gallstones or sludge (cholelithiasis) in your gallbladder. The gallbladder stores a fluid that helps digest fats (bile). Cholecystitis is serious and needs treatment right away.  CAUSES   Gallstones. Gallstones can block the tube that leads to your gallbladder, causing bile to build up. As bile builds up, the gallbladder becomes inflamed.  Bile duct problems, such as blockage from scarring or kinking.  Tumors. Tumors can stop bile from leaving your gallbladder correctly, causing bile to build up. As bile builds up, the gallbladder becomes inflamed. SYMPTOMS   Nausea.  Vomiting.  Abdominal pain, especially in the upper right area of your abdomen.  Abdominal tenderness or bloating.  Sweating.  Chills.  Fever.  Yellowing of the skin and the whites of the eyes (jaundice). DIAGNOSIS  Your caregiver may order blood tests to look for infection or gallbladder problems. Your caregiver may also order imaging tests, such as an ultrasound or computed tomography (CT) scan. Further tests may include a hepatobiliary iminodiacetic acid (HIDA) scan. This scan allows your caregiver to see your bile move from the liver to the gallbladder and to the small intestine. TREATMENT  A hospital stay is usually necessary to lessen the inflammation of your gallbladder. You may be required to not eat or drink (fast) for a certain amount of time. You may be given medicine to treat pain or an antibiotic medicine to treat an infection. Surgery may be needed to remove your gallbladder (cholecystectomy) once the inflammation has gone down. Surgery may be needed right away if you  develop complications such as death of gallbladder tissue (gangrene) or a tear (perforation) of the gallbladder.  [place] care will depend on your treatment. In general:  If you were given antibiotics, take them as directed. Finish them even if you start to feel better.  Only take over-the-counter or prescription medicines for pain, discomfort, or fever as directed by your caregiver.  Follow a low-fat diet until you see your caregiver again.  Keep all follow-up visits as directed by your caregiver. SEEK IMMEDIATE MEDICAL CARE IF:   Your pain is increasing and not controlled by medicines.  Your pain moves to another part of your abdomen or to your back.  You have a fever.  You have nausea and vomiting. MAKE SURE YOU:  Understand these instructions.  Will watch your condition.  Will get help right away if you are not doing well or get worse. Document Released: 10/04/2005 Document Revised: 12/27/2011 Document Reviewed: 08/20/2011 [facility] Patient Information 2015 [place], [place]. This information is not intended to replace advice given to you by your health care provider. Make sure you discuss any questions you have with your health care provider.

## 2014-09-09 ENCOUNTER — Telehealth: Payer: Self-pay | Admitting: Internal Medicine

## 2014-09-09 NOTE — Telephone Encounter (Signed)
Left message for Jennifer to call back

## 2014-09-10 NOTE — Telephone Encounter (Signed)
Dr. Excell Seltzer would like this pt to have an EGD asap before doing gallbladder surgery. States he wants an EGD done to rule out underlying peptic ulcer disease as the cause of pain. Dr. Henrene Pastor I blocked a spot on your [place] schedule on 12/1 and 12/4. Can this pt be scheduled for a direct EGD or do you want her seen 1st? Please advise.

## 2014-09-10 NOTE — Telephone Encounter (Signed)
Pt scheduled for EGD with Dr. Henrene Pastor 09/20/14@10am . Pt scheduled for previsit 09/17/14, pt aware of both appts.

## 2014-09-10 NOTE — Telephone Encounter (Signed)
Direct is fine for EGD. Convert this to phone note to refresh my memory when she arrives. Put her in 09-20-14 slot. Thanks

## 2014-09-11 ENCOUNTER — Other Ambulatory Visit: Payer: Self-pay | Admitting: Physician Assistant

## 2014-09-17 ENCOUNTER — Ambulatory Visit (AMBULATORY_SURGERY_CENTER): Payer: Self-pay | Admitting: *Deleted

## 2014-09-17 VITALS — Ht 62.0 in | Wt 105.2 lb

## 2014-09-17 DIAGNOSIS — R1013 Epigastric pain: Secondary | ICD-10-CM

## 2014-09-17 DIAGNOSIS — G8929 Other chronic pain: Secondary | ICD-10-CM

## 2014-09-17 NOTE — Progress Notes (Signed)
No egg or soy allergy  No anesthesia or intubation problems per pt  No diet medications taken  Registered in EMMI   

## 2014-09-18 ENCOUNTER — Ambulatory Visit: Payer: BC Managed Care – PPO | Admitting: Gastroenterology

## 2014-09-20 ENCOUNTER — Ambulatory Visit (AMBULATORY_SURGERY_CENTER): Payer: BC Managed Care – PPO | Admitting: Internal Medicine

## 2014-09-20 ENCOUNTER — Encounter: Payer: Self-pay | Admitting: Internal Medicine

## 2014-09-20 VITALS — BP 124/78 | HR 84 | Temp 97.7°F | Resp 21 | Ht 62.0 in | Wt 105.0 lb

## 2014-09-20 DIAGNOSIS — R1013 Epigastric pain: Secondary | ICD-10-CM

## 2014-09-20 DIAGNOSIS — K253 Acute gastric ulcer without hemorrhage or perforation: Secondary | ICD-10-CM

## 2014-09-20 DIAGNOSIS — K269 Duodenal ulcer, unspecified as acute or chronic, without hemorrhage or perforation: Secondary | ICD-10-CM

## 2014-09-20 MED ORDER — SODIUM CHLORIDE 0.9 % IV SOLN: 500.0000 mL | INTRAVENOUS | Status: DC

## 2014-09-20 NOTE — Progress Notes (Signed)
No problems noted in the recovery room. Maw  pT PLACED DNTURES IN HER MOUTH. MAW

## 2014-09-20 NOTE — Patient Instructions (Addendum)

## 2014-09-20 NOTE — Progress Notes (Signed)
Report to PACU, RN, vss, BBS= Clear.  

## 2014-09-23 ENCOUNTER — Telehealth: Payer: Self-pay | Admitting: *Deleted

## 2014-09-23 NOTE — Telephone Encounter (Signed)
  Follow up Call-  Call back number 09/20/2014  Post procedure Call Back phone  # [phone]  Permission to leave phone message Yes     Patient questions:  Do you have a fever, pain , or abdominal swelling? No. Pain Score  0 *  Have you tolerated food without any problems? Yes.    Have you been able to return to your normal activities? Yes.    Do you have any questions about your discharge instructions: Diet   No. Medications  No. Follow up visit  No.  Do you have questions or concerns about your Care? No.  Actions: * If pain score is 4 or above: No action needed, pain <4.

## 2014-09-26 NOTE — Op Note (Signed)
[facility]  [address] [place], [id number]   ENDOSCOPY PROCEDURE REPORT  PATIENT: [name], [name]  MR#: [id number] BIRTHDATE: Jun 21, 1956 , 47  yrs. old GENDER: female ENDOSCOPIST: Eustace Quail, MD REFERRED BY:  Excell Seltzer, M.D. PROCEDURE DATE:  09/23/2014 PROCEDURE:  EGD, diagnostic ASA CLASS:     Class II INDICATIONS:  epigastric pain.  Marland Kitchen Possibly for cholecystectomy. Rule out other causes for pain. History of ulcers MEDICATIONS: Monitored anesthesia care and Propofol 150 mg IV TOPICAL ANESTHETIC: none  DESCRIPTION OF PROCEDURE: After the risks benefits and alternatives of the procedure were thoroughly explained, informed consent was obtained.  The LB [id number] endoscope was introduced through the mouth and advanced to the second portion of the duodenum , Without limitations.  The instrument was slowly withdrawn as the mucosa was fully examined.    NOTE: The endoscopy reporting system was nonfunctional during this examination. There was no ability to photo documented. This report was created after the system malfunctions were corrected    The esophagus was normal.  The stomach revealed a prepyloric ulcer which was clean based.  The duodenal bulb revealed a linear ulcer which was clean based.  These ulcers measured approximately 7-10 mm.  Retroflexed views revealed no abnormalities.     The scope was then withdrawn from the patient and the procedure completed.  COMPLICATIONS: There were no immediate complications.  ENDOSCOPIC IMPRESSION: 1. Gastric and duodenal ulcers. Could explain abdominal complaints   RECOMMENDATIONS: 1.  Continue PPI twice daily 2.  Avoid NSAIDS completely 3.  Repeat Upper Endoscopy  in 8 weeks to assess for ulcer healing  REPEAT EXAM:  eSigned:  Eustace Quail, MD 09/26/2014 12:59 PM    VQ:[id number] Excell Seltzer, MD, Unk Pinto, MD, and The Patient

## 2014-09-27 ENCOUNTER — Encounter: Payer: Self-pay | Admitting: Internal Medicine

## 2014-10-03 ENCOUNTER — Encounter: Payer: Self-pay | Admitting: Internal Medicine

## 2014-10-03 ENCOUNTER — Ambulatory Visit (INDEPENDENT_AMBULATORY_CARE_PROVIDER_SITE_OTHER): Payer: BC Managed Care – PPO | Admitting: Internal Medicine

## 2014-10-03 VITALS — BP 106/68 | HR 100 | Temp 98.1°F | Resp 16 | Ht 62.25 in | Wt 104.8 lb

## 2014-10-03 DIAGNOSIS — I10 Essential (primary) hypertension: Secondary | ICD-10-CM

## 2014-10-03 DIAGNOSIS — R7309 Other abnormal glucose: Secondary | ICD-10-CM

## 2014-10-03 DIAGNOSIS — E559 Vitamin D deficiency, unspecified: Secondary | ICD-10-CM

## 2014-10-03 DIAGNOSIS — E782 Mixed hyperlipidemia: Secondary | ICD-10-CM

## 2014-10-03 DIAGNOSIS — Z79899 Other long term (current) drug therapy: Secondary | ICD-10-CM

## 2014-10-03 HISTORY — DX: Essential (primary) hypertension: I10

## 2014-10-03 LAB — CBC WITH DIFFERENTIAL/PLATELET
BASOS ABS: 0 10*3/uL (ref 0.0–0.1)
BASOS PCT: 0 % (ref 0–1)
EOS ABS: 0.2 10*3/uL (ref 0.0–0.7)
Eosinophils Relative: 3 % (ref 0–5)
HCT: 37 % (ref 36.0–46.0)
Hemoglobin: 12.4 g/dL (ref 12.0–15.0)
Lymphocytes Relative: 43 % (ref 12–46)
Lymphs Abs: 2.2 10*3/uL (ref 0.7–4.0)
MCH: 31 pg (ref 26.0–34.0)
MCHC: 33.2 g/dL (ref 30.0–36.0)
MCV: 92.5 fL (ref 78.0–100.0)
MPV: 9.1 fL — AB (ref 9.4–12.4)
Monocytes Absolute: 0.4 10*3/uL (ref 0.1–1.0)
Monocytes Relative: 8 % (ref 3–12)
NEUTROS ABS: 2.3 10*3/uL (ref 1.7–7.7)
NEUTROS PCT: 46 % (ref 43–77)
PLATELETS: 300 10*3/uL (ref 150–400)
RBC: 4 MIL/uL (ref 3.87–5.11)
RDW: 14.6 % (ref 11.5–15.5)
WBC: 5 10*3/uL (ref 4.0–10.5)

## 2014-10-03 LAB — HEMOGLOBIN A1C
Hgb A1c MFr Bld: 5.4 % (ref <5.7)
MEAN PLASMA GLUCOSE: 108 mg/dL (ref <117)

## 2014-10-03 NOTE — Patient Instructions (Signed)

## 2014-10-03 NOTE — Progress Notes (Signed)
Patient ID: [name] Reason, female   DOB: 1956/05/18, 58 y.o.   MRN: [id number]   This very nice 58 y.o.MWF presents for 3 month follow up with Hypertension, Hyperlipidemia, Pre-Diabetes, COPD and Vitamin D Deficiency. Patient brags no smoking since Sept 2015 !   Patient is monitored for HTN & BP has been controlled at home. Today's BP was 106/68 mmHg. Patient has had no complaints of any cardiac type chest pain, palpitations, dyspnea/orthopnea/PND, dizziness, claudication, or dependent edema.   Hyperlipidemia is controlled with diet & meds. Patient denies myalgias or other med SE's. Last Lipids were Total  Chol 198; HDL 49; LDL 128*; Trig 105 on 05/31/2014.   Also, the patient has history of PreDiabetes (A1c  Was 5.8% in 2000 and 5.7% in 2013) and has had no symptoms of reactive hypoglycemia, diabetic polys, paresthesias or visual blurring.  Last A1c was  5.1% on  01/01/2014.   Further, the patient also has history of Vitamin D Deficiency of 27 in 2008 and supplements vitamin D without any suspected side-effects. Last vitamin D was  49 on  05/31/2014.   Medication List   acetaminophen-codeine 300-30 MG per tablet  Take 1 tablet by mouth every 6 (six) hours as needed for moderate pain.      ADVAIR DISKUS 250-50 MCG/DOSE Aepb  Inhale 1 puff into the lungs every 12 (twelve) hours.     albuterol 108 (90 BASE) MCG/ACT inhaler  Inhale 2 puffs into the lungs every 6 (six) hours as needed       alendronate 70 MG tablet  Take 70 mg by mouth every 7 (seven) days.      citalopram 20 MG tablet     ferrous sulfate 325 (65 FE) MG tablet  Take 325 mg by mouth daily with breakfast.     fexofenadine 180 MG tablet     omeprazole 40 MG capsule  TAKE 1 CAPSULE TWICE A DAY      ZOFRAN  Take 1 tablet (4 mg total) by mouth daily as needed for nausea or vomiting.     sucralfate 1 G tablet  1 PILL BEFORE MEALS 2-3 TIMES DAILY AS NEEDED FOR PAIN     Vitamin D 2000 UNITS Caps  Take 7000 units a day      Allergies  Allergen Reactions  . Celebrex [Celecoxib]     GI bleed  . Crestor [Rosuvastatin]     Made legs hurt  . Penicillins     REACTION: whelps  . Pravastatin     Muscle aches   PMHx:   Past Medical History  Diagnosis Date  . GERD (gastroesophageal reflux disease)   . Duodenal ulcer   . Internal hemorrhoids   . Hx of colonic polyps   . Arthritis   . Hyperlipemia   . Family history of malignant neoplasm of gastrointestinal tract   . COPD (chronic obstructive pulmonary disease)   . Asthma   . Vitamin D deficiency   . Allergy   . Anemia   . Blood transfusion without reported diagnosis   . Cataract   . Osteoporosis   . HTN (hypertension) 10/03/2014   Immunization History  Administered Date(s) Administered  . Pneumococcal-Unspecified 05/18/2001  . Td 12/16/2004   Past Surgical History  Procedure Laterality Date  . Abdominal hysterectomy    . Appendectomy    . Total hip arthroplasty      right  . Lumbar disc surgery    . Elbow surgery      rt  .  Wrist surgery      left  . Hand surgery    . Esophagogastroduodenoscopy  10/2008  . Joint replacement Right 2007    hip  . Colonoscopy    . Upper gastrointestinal endoscopy    . Trigeminal neuralgia surgery     FHx:    Reviewed / unchanged  SHx:    Reviewed / unchanged  Systems Review:  Constitutional: Denies fever, chills, wt changes, headaches, insomnia, fatigue, night sweats, change in appetite. Eyes: Denies redness, blurred vision, diplopia, discharge, itchy, watery eyes.  ENT: Denies discharge, congestion, post nasal drip, epistaxis, sore throat, earache, hearing loss, dental pain, tinnitus, vertigo, sinus pain, snoring.  CV: Denies chest pain, palpitations, irregular heartbeat, syncope, dyspnea, diaphoresis, orthopnea, PND, claudication or edema. Respiratory: denies cough, dyspnea, DOE, pleurisy, hoarseness, laryngitis, wheezing.  Gastrointestinal: Denies dysphagia, odynophagia, heartburn, reflux, water  brash, abdominal pain or cramps, nausea, vomiting, bloating, diarrhea, constipation, hematemesis, melena, hematochezia  or hemorrhoids. Genitourinary: Denies dysuria, frequency, urgency, nocturia, hesitancy, discharge, hematuria or flank pain. Musculoskeletal: Denies arthralgias, myalgias, stiffness, jt. swelling, pain, limping or strain/sprain.  Skin: Denies pruritus, rash, hives, warts, acne, eczema or change in skin lesion(s). Neuro: No weakness, tremor, incoordination, spasms, paresthesia or pain. Psychiatric: Denies confusion, memory loss or sensory loss. Endo: Denies change in weight, skin or hair change.  Heme/Lymph: No excessive bleeding, bruising or enlarged lymph nodes.  Physical Exam  BP 106/68   Pulse 100  Temp 98.1 F   Resp 16  Ht 5' 2.25"   Wt 104 lb 12.8 oz        BMI 19.02   Appears well nourished and in no distress.  Eyes: PERRLA, EOMs, conjunctiva no swelling or erythema. Sinuses: No frontal/maxillary tenderness ENT/Mouth: EAC's clear, TM's nl w/o erythema, bulging. Nares clear w/o erythema, swelling, exudates. Oropharynx clear without erythema or exudates. Oral hygiene is good. Tongue normal, non obstructing. Hearing intact.  Neck: Supple. Thyroid nl. Car 2+/2+ without bruits, nodes or JVD. Chest: Respirations nl with BS clear & equal w/o rales, rhonchi, wheezing or stridor.  Cor: Heart sounds normal w/ regular rate and rhythm without sig. murmurs, gallops, clicks, or rubs. Peripheral pulses normal and equal  without edema.  Abdomen: Soft & bowel sounds normal. Non-tender w/o guarding, rebound, hernias, masses, or organomegaly.  Lymphatics: Unremarkable.  Musculoskeletal: Full ROM all peripheral extremities, joint stability, 5/5 strength, and normal gait.  Skin: Warm, dry without exposed rashes, lesions or ecchymosis apparent.  Neuro: Cranial nerves intact, reflexes equal bilaterally. Sensory-motor testing grossly intact. Tendon reflexes grossly intact.  Pysch:  Alert & oriented x 3.  Insight and judgement nl & appropriate. No ideations.  Assessment and Plan:  1. Hypertension - Continue monitor blood pressure at home. Continue diet/meds same.  2. Hyperlipidemia - Continue diet/meds, exercise,& lifestyle modifications. Continue monitor periodic cholesterol/liver & renal functions   3. Pre-Diabetes - Continue diet, exercise, lifestyle modifications. Monitor appropriate labs.  4. Vitamin D Deficiency - Continue supplementation.  5. COPD -   Recommended regular exercise, BP monitoring, weight control, and discussed med and SE's. Recommended labs to assess and monitor clinical status. Further disposition pending results of labs.

## 2014-10-04 LAB — BASIC METABOLIC PANEL WITH GFR
BUN: 17 mg/dL (ref 6–23)
CO2: 19 mEq/L (ref 19–32)
Calcium: 9.3 mg/dL (ref 8.4–10.5)
Chloride: 103 mEq/L (ref 96–112)
Creat: 0.69 mg/dL (ref 0.50–1.10)
GFR, Est African American: >89 mL/min
Glucose, Bld: 77 mg/dL (ref 70–99)
POTASSIUM: 4.6 meq/L (ref 3.5–5.3)
SODIUM: 135 meq/L (ref 135–145)

## 2014-10-04 LAB — LIPID PANEL
CHOLESTEROL: 218 mg/dL — AB (ref 0–200)
HDL: 57 mg/dL (ref >39)
LDL CALC: 134 mg/dL — AB (ref 0–99)
Total CHOL/HDL Ratio: 3.8 Ratio
Triglycerides: 133 mg/dL (ref <150)
VLDL: 27 mg/dL (ref 0–40)

## 2014-10-04 LAB — MAGNESIUM: MAGNESIUM: 1.7 mg/dL (ref 1.5–2.5)

## 2014-10-04 LAB — HEPATIC FUNCTION PANEL
AST: 14 U/L (ref 0–37)
Albumin: 4.2 g/dL (ref 3.5–5.2)
Alkaline Phosphatase: 68 U/L (ref 39–117)
Total Bilirubin: 0.2 mg/dL (ref 0.2–1.2)
Total Protein: 7 g/dL (ref 6.0–8.3)

## 2014-10-04 LAB — TSH: TSH: 1.93 u[IU]/mL (ref 0.350–4.500)

## 2014-10-04 LAB — VITAMIN D 25 HYDROXY (VIT D DEFICIENCY, FRACTURES): VIT D 25 HYDROXY: 33 ng/mL (ref 30–100)

## 2014-10-04 LAB — INSULIN, FASTING: Insulin fasting, serum: 16.5 u[IU]/mL (ref 2.0–19.6)

## 2014-10-23 ENCOUNTER — Other Ambulatory Visit: Payer: Self-pay | Admitting: Physician Assistant

## 2014-11-06 ENCOUNTER — Ambulatory Visit (AMBULATORY_SURGERY_CENTER): Payer: Self-pay | Admitting: *Deleted

## 2014-11-06 VITALS — Ht 62.0 in | Wt 110.0 lb

## 2014-11-06 DIAGNOSIS — K269 Duodenal ulcer, unspecified as acute or chronic, without hemorrhage or perforation: Secondary | ICD-10-CM

## 2014-11-06 DIAGNOSIS — K2981 Duodenitis with bleeding: Secondary | ICD-10-CM

## 2014-11-06 NOTE — Progress Notes (Signed)
Patient denies any allergies to eggs or soy. Patient denies any problems with anesthesia/sedation. Patient denies any oxygen use at home and does not take any diet/weight loss medications.  Pt did not want EMMI info., patient previously had this.

## 2014-11-12 ENCOUNTER — Ambulatory Visit (INDEPENDENT_AMBULATORY_CARE_PROVIDER_SITE_OTHER): Payer: BLUE CROSS/BLUE SHIELD | Admitting: Physician Assistant

## 2014-11-12 ENCOUNTER — Encounter: Payer: Self-pay | Admitting: Physician Assistant

## 2014-11-12 VITALS — BP 120/70 | HR 85 | Temp 97.9°F | Resp 16 | Ht 62.0 in | Wt 105.0 lb

## 2014-11-12 DIAGNOSIS — R002 Palpitations: Secondary | ICD-10-CM

## 2014-11-12 DIAGNOSIS — R42 Dizziness and giddiness: Secondary | ICD-10-CM

## 2014-11-12 DIAGNOSIS — R0600 Dyspnea, unspecified: Secondary | ICD-10-CM

## 2014-11-12 DIAGNOSIS — R51 Headache: Secondary | ICD-10-CM

## 2014-11-12 DIAGNOSIS — R55 Syncope and collapse: Secondary | ICD-10-CM

## 2014-11-12 DIAGNOSIS — R350 Frequency of micturition: Secondary | ICD-10-CM

## 2014-11-12 DIAGNOSIS — R519 Headache, unspecified: Secondary | ICD-10-CM

## 2014-11-12 DIAGNOSIS — I959 Hypotension, unspecified: Secondary | ICD-10-CM

## 2014-11-12 LAB — TSH: TSH: 2.794 u[IU]/mL (ref 0.350–4.500)

## 2014-11-12 LAB — CBC WITH DIFFERENTIAL/PLATELET
BASOS PCT: 1 % (ref 0–1)
Basophils Absolute: 0.1 10*3/uL (ref 0.0–0.1)
EOS PCT: 1 % (ref 0–5)
Eosinophils Absolute: 0.1 10*3/uL (ref 0.0–0.7)
HEMATOCRIT: 38.8 % (ref 36.0–46.0)
Hemoglobin: 12.8 g/dL (ref 12.0–15.0)
LYMPHS PCT: 35 % (ref 12–46)
Lymphs Abs: 2 10*3/uL (ref 0.7–4.0)
MCH: 30.6 pg (ref 26.0–34.0)
MCHC: 33 g/dL (ref 30.0–36.0)
MCV: 92.8 fL (ref 78.0–100.0)
MPV: 8.5 fL — AB (ref 8.6–12.4)
Monocytes Absolute: 0.6 10*3/uL (ref 0.1–1.0)
Monocytes Relative: 11 % (ref 3–12)
Neutro Abs: 3 10*3/uL (ref 1.7–7.7)
Neutrophils Relative %: 52 % (ref 43–77)
PLATELETS: 307 10*3/uL (ref 150–400)
RBC: 4.18 MIL/uL (ref 3.87–5.11)
RDW: 15 % (ref 11.5–15.5)
WBC: 5.8 10*3/uL (ref 4.0–10.5)

## 2014-11-12 LAB — HEPATIC FUNCTION PANEL
ALK PHOS: 69 U/L (ref 39–117)
AST: 17 U/L (ref 0–37)
Albumin: 4.5 g/dL (ref 3.5–5.2)
BILIRUBIN DIRECT: 0.1 mg/dL (ref 0.0–0.3)
BILIRUBIN TOTAL: 0.3 mg/dL (ref 0.2–1.2)
Indirect Bilirubin: 0.2 mg/dL (ref 0.2–1.2)
TOTAL PROTEIN: 7.6 g/dL (ref 6.0–8.3)

## 2014-11-12 LAB — BASIC METABOLIC PANEL WITH GFR
BUN: 23 mg/dL (ref 6–23)
CO2: 22 meq/L (ref 19–32)
CREATININE: 0.71 mg/dL (ref 0.50–1.10)
Calcium: 10.2 mg/dL (ref 8.4–10.5)
Chloride: 104 mEq/L (ref 96–112)
Glucose, Bld: 88 mg/dL (ref 70–99)
Potassium: 4.9 mEq/L (ref 3.5–5.3)
Sodium: 137 mEq/L (ref 135–145)

## 2014-11-12 LAB — D-DIMER, QUANTITATIVE: D-Dimer, Quant: 0.76 ug/mL-FEU — ABNORMAL HIGH (ref 0.00–0.48)

## 2014-11-12 LAB — MAGNESIUM: Magnesium: 2.1 mg/dL (ref 1.5–2.5)

## 2014-11-12 NOTE — Patient Instructions (Signed)
Do not drive until we figure out the cause of the syncopal episode.   Push fluids like water and gatorade, suggest G2 (less sugar).   Go to the ER if any CP, SOB, nausea, dizziness, severe HA, changes vision/speech  Syncope Syncope is a medical term for fainting or passing out. This means you lose consciousness and drop to the ground. People are generally unconscious for less than 5 minutes. You may have some muscle twitches for up to 15 seconds before waking up and returning to normal. Syncope occurs more often in older adults, but it can happen to anyone. While most causes of syncope are not dangerous, syncope can be a sign of a serious medical problem. It is important to seek medical care.  CAUSES  Syncope is caused by a sudden drop in blood flow to the brain. The specific cause is often not determined. Factors that can bring on syncope include:  Taking medicines that lower blood pressure.  Sudden changes in posture, such as standing up quickly.  Taking more medicine than prescribed.  Standing in one place for too long.  Seizure disorders.  Dehydration and excessive exposure to heat.  Low blood sugar (hypoglycemia).  Straining to have a bowel movement.  Heart disease, irregular heartbeat, or other circulatory problems.  Fear, emotional distress, seeing blood, or severe pain. SYMPTOMS  Right before fainting, you may:  Feel dizzy or light-headed.  Feel nauseous.  See all white or all black in your field of vision.  Have cold, clammy skin. DIAGNOSIS  Your health care provider will ask about your symptoms, perform a physical exam, and perform an electrocardiogram (ECG) to record the electrical activity of your heart. Your health care provider may also perform other heart or blood tests to determine the cause of your syncope which may include:  Transthoracic echocardiogram (TTE). During echocardiography, sound waves are used to evaluate how blood flows through your  heart.  Transesophageal echocardiogram (TEE).  Cardiac monitoring. This allows your health care provider to monitor your heart rate and rhythm in real time.  Holter monitor. This is a portable device that records your heartbeat and can help diagnose heart arrhythmias. It allows your health care provider to track your heart activity for several days, if needed.  Stress tests by exercise or by giving medicine that makes the heart beat faster. TREATMENT  In most cases, no treatment is needed. Depending on the cause of your syncope, your health care provider may recommend changing or stopping some of your medicines. HOME CARE INSTRUCTIONS  Have someone stay with you until you feel stable.  Do not drive, use machinery, or play sports until your health care provider says it is okay.  Keep all follow-up appointments as directed by your health care provider.  Lie down right away if you start feeling like you might faint. Breathe deeply and steadily. Wait until all the symptoms have passed.  Drink enough fluids to keep your urine clear or pale yellow.  If you are taking blood pressure or heart medicine, get up slowly and take several minutes to sit and then stand. This can reduce dizziness. SEEK IMMEDIATE MEDICAL CARE IF:   You have a severe headache.  You have unusual pain in the chest, abdomen, or back.  You are bleeding from your mouth or rectum, or you have black or tarry stool.  You have an irregular or very fast heartbeat.  You have pain with breathing.  You have repeated fainting or seizure-like jerking during an  episode.  You faint when sitting or lying down.  You have confusion.  You have trouble walking.  You have severe weakness.  You have vision problems. If you fainted, call your local emergency services (911 in [place] Do not drive yourself to the hospital.  MAKE SURE YOU:  Understand these instructions.  Will watch your condition.  Will get help right away  if you are not doing well or get worse. Document Released: 10/04/2005 Document Revised: 10/09/2013 Document Reviewed: 12/03/2011 [facility] Patient Information 2015 [employer], [place]. This information is not intended to replace advice given to you by your health care provider. Make sure you discuss any questions you have with your health care provider.

## 2014-11-12 NOTE — Progress Notes (Addendum)
Assessment and Plan: Syncopal episode- we do not have records- ? Post micturition vasovagal episode, hypotensive, seizure, arrhythmia, ETOH use, etc,  Questionable postictal period with loss of bladder control- will get EEG sinus tachycardia- will check labs for infection/dehydration, check Ddimer low risk PE, needs Holter monitor rule out arrhyhtmia will refer to cardio, add gatorade/push fluids  Headache/right temple pain- normal neuro- will get ESR rule out TA, get MRI rule out stroke. Dizziness/hypotension- get carotid US, holter monitor, get MRI head, get EEG, check labs, get CXR.    Get records from [facility]- received after patient left- willl continue with current plan-  Patient advised not to drive until we figure out the cause, understands.  Go to the ER if any CP, SOB, nausea, dizziness, severe HA, changes vision/speech OVER 30 minutes of exam, counseling, chart review, referral performed  Addendum: Ddimer + and sed rate +, with recent syncopal episode, tachycardia, cough, dyspnea, smoking history, suggest getting CTA chest to rule out PE- cancel CXR.   HPI 59 y.o.female former 18 pack year smoker, quit 06/2014, with history of HTN, COPD, preDM, hyperlipidemia, gastric ulcers presents for follow up from the hospital at [facility] for a syncopal episode. She went to the ER 11/12/2014.   She states she was watching the [place] football game with her family, she ate [place] that night, went to a bar. Admits to drinking 4 beers while at the bar starting at 5, got up to go to the bathroom to urinate at around 10pm, when she got back she does remember anything until she was at the hospital, denies CP, SOB, dizziness, nausea, diaphoresis, changes in vision/speech prior to fall. She fell to her right, hitting right head, LOC x 10 mins.  Per patient, witnesses state she came out of the bathroom with pants unbutton, then she collapsed,  it took her 10 mins to wake up, when she  did wake up she was disoriented/combatitve and she states she did lose bladder control with falling. In the ER she had slight anemia H/H 11.5/34.3, normal WBC, Calcium 8.3, albumin 3.4, normal urine except low specific gravity, and CT head and neck showed no intercranial injury/fracture but nonspecific 6 mm lucency at right frontal calvarium new from 2010 (correlate labs to exclude MM/metastatic disease), chronic fx C7 and bilateral carotid bifurcations.    She has also noticed this past week she has been breaking out into hot sweats. She also complains of continuing neck pain, right jaw/ear pain, did fall on her right side. She has also had continue dizziness with standing. Denies decreased hearing/ringing in that ear, CP, SOB, diarrhea, AB pain, changes in vision/speech. She has had some hypotension at home, BP 109/70, 100/56 with HR 109.   Had EGD 09/2014, colonoscopy 2013, CT chest 2010, CXR 10/2013, normal myocardial stress test 11/2007, Echo 11/2008 EF 60%, AB [place] 08/2014  Blood pressure 120/80, pulse 100, temperature 97.9 F (36.6 C), resp. rate 16, height 5' 2" (1.575 m), weight 105 lb (47.628 kg).   Past Medical History  Diagnosis Date  . GERD (gastroesophageal reflux disease)   . Duodenal ulcer   . Internal hemorrhoids   . Hx of colonic polyps   . Arthritis   . Hyperlipemia   . Family history of malignant neoplasm of gastrointestinal tract   . COPD (chronic obstructive pulmonary disease)   . Asthma   . Vitamin D deficiency   . Allergy   . Anemia   . Blood transfusion without reported  diagnosis   . Cataract   . Osteoporosis   . HTN (hypertension) 10/03/2014     Allergies  Allergen Reactions  . Celebrex [Celecoxib]     GI bleed  . Crestor [Rosuvastatin]     Made legs hurt  . Penicillins     REACTION: whelps  . Pravastatin     Muscle aches      Current Outpatient Prescriptions on File Prior to Visit  Medication Sig Dispense Refill  . acetaminophen (TYLENOL) 500 MG  tablet Take 500 mg by mouth every 6 (six) hours as needed.      Marland Kitchen acetaminophen-codeine (TYLENOL #3) 300-30 MG per tablet Take 1 tablet by mouth every 6 (six) hours as needed for moderate pain. MDD: 4 tablets per day 40 tablet 0  . albuterol (PROVENTIL HFA;VENTOLIN HFA) 108 (90 BASE) MCG/ACT inhaler Inhale 2 puffs into the lungs every 6 (six) hours as needed for wheezing or shortness of breath. 1 Inhaler 2  . alendronate (FOSAMAX) 70 MG tablet Take 70 mg by mouth every 7 (seven) days. Take with a full glass of water on an empty stomach.    . Cholecalciferol (VITAMIN D) 2000 UNITS CAPS Take 7000 units a day     . citalopram (CELEXA) 20 MG tablet TAKE 1 TABLET (20 MG TOTAL) BY MOUTH DAILY. 90 tablet 0  . ferrous sulfate 325 (65 FE) MG tablet Take 325 mg by mouth daily with breakfast.      . fexofenadine (ALLEGRA) 180 MG tablet Take 180 mg by mouth as needed.    . Fluticasone-Salmeterol (ADVAIR DISKUS) 250-50 MCG/DOSE AEPB Inhale 1 puff into the lungs every 12 (twelve) hours.    Marland Kitchen omeprazole (PRILOSEC) 40 MG capsule TAKE 1 CAPSULE TWICE A DAY 60 capsule 10  . ondansetron (ZOFRAN) 4 MG tablet Take 1 tablet (4 mg total) by mouth daily as needed for nausea or vomiting. 30 tablet 1  . sucralfate (CARAFATE) 1 G tablet 1 PILL BEFORE MEALS 2-3 TIMES DAILY AS NEEDED FOR PAIN 60 tablet 0   No current facility-administered medications on file prior to visit.    ROS:  Review of Systems  Constitutional: Negative.   HENT: Positive for ear pain (right ear). Negative for congestion, ear discharge, hearing loss, nosebleeds, sore throat and tinnitus.   Eyes: Negative for blurred vision, double vision, photophobia, pain, discharge and redness.  Respiratory: Positive for cough (nonproductive). Negative for hemoptysis, sputum production, shortness of breath, wheezing and stridor.   Cardiovascular: Positive for palpitations (occ flutters). Negative for chest pain, orthopnea, claudication, leg swelling and PND.   Gastrointestinal: Negative for heartburn, nausea, vomiting, abdominal pain, diarrhea, constipation, blood in stool and melena.  Genitourinary: Negative.   Musculoskeletal: Positive for myalgias, falls (syncopal episdoe) and neck pain. Negative for back pain and joint pain.  Skin: Negative.   Neurological: Positive for dizziness (since fall, with standing and laying down. ), loss of consciousness and headaches (since fall, tender on right side). Negative for tingling, tremors, sensory change, speech change, focal weakness and seizures.  Endo/Heme/Allergies: Positive for polydipsia. Negative for environmental allergies. Bruises/bleeds easily.  Psychiatric/Behavioral: Negative for depression, suicidal ideas, hallucinations, memory loss and substance abuse. The patient is not nervous/anxious and does not have insomnia.     Physical Exam: Filed Weights   11/12/14 1022  Weight: 105 lb (47.628 kg)   BP 120/80 mmHg  Pulse 100  Temp(Src) 97.9 F (36.6 C)  Resp 16  Ht 5' 2" (1.575 m)  Wt 105 lb (47.628  kg)  BMI 19.20 kg/m2   General Appearance: underweight, barrel chested, in no apparent distress. Eyes: PERRLA, EOMs, conjunctiva no swelling or erythema, left pupil slightly larger than right Sinuses: No Frontal/maxillary tenderness ENT/Mouth: Ext aud canals clear, TMs without erythema, bulging. No erythema, swelling, or exudate on post pharynx.  Tonsils not swollen or erythematous. Hearing normal. + left temporal tenderness.   Neck: Supple, thyroid normal.  Respiratory: Respiratory effort normal, BS equal bilaterally without rales, rhonchi, wheezing or stridor.  Cardio: tachycardia with regular rhythm with no MRGs. Brisk peripheral pulses without edema.  Abdomen: Soft, + BS.  epigastric tenderness, no guarding, rebound, hernias, masses. Lymphatics: Non tender without lymphadenopathy.  Musculoskeletal: Full ROM, 5/5 strength, normal gait.  Skin: Warm, dry without rashes, lesions, ecchymosis.   Neuro: Cranial nerves intact. Normal muscle tone, no cerebellar symptoms.  Psych: Awake and oriented X 3, normal affect, Insight and Judgment appropriate.    Vicie Mutters, PA-C 10:48 AM [facility]

## 2014-11-13 LAB — URINALYSIS, ROUTINE W REFLEX MICROSCOPIC
BILIRUBIN URINE: NEGATIVE
Glucose, UA: NEGATIVE mg/dL
HGB URINE DIPSTICK: NEGATIVE
Ketones, ur: NEGATIVE mg/dL
Nitrite: NEGATIVE
Protein, ur: NEGATIVE mg/dL
SPECIFIC GRAVITY, URINE: 1.016 (ref 1.005–1.030)
Urobilinogen, UA: 0.2 mg/dL (ref 0.0–1.0)
pH: 5 (ref 5.0–8.0)

## 2014-11-13 LAB — URINALYSIS, MICROSCOPIC ONLY
Bacteria, UA: NONE SEEN
CRYSTALS: NONE SEEN
Casts: NONE SEEN

## 2014-11-13 LAB — SEDIMENTATION RATE: Sed Rate: 26 mm/hr — ABNORMAL HIGH (ref 0–22)

## 2014-11-13 LAB — URINE CULTURE
COLONY COUNT: NO GROWTH
Organism ID, Bacteria: NO GROWTH

## 2014-11-13 NOTE — Addendum Note (Signed)
Addended by: Vicie Mutters R on: 11/13/2014 08:54 AM   Modules accepted: Orders

## 2014-11-15 LAB — ACTH: C206 ACTH: 7 pg/mL (ref 6–50)

## 2014-11-19 ENCOUNTER — Ambulatory Visit
Admission: RE | Admit: 2014-11-19 | Discharge: 2014-11-19 | Disposition: A | Discharge status: Home / Self Care | Payer: BLUE CROSS/BLUE SHIELD | Source: Ambulatory Visit | Attending: Physician Assistant | Admitting: Physician Assistant

## 2014-11-19 DIAGNOSIS — R002 Palpitations: Secondary | ICD-10-CM

## 2014-11-19 DIAGNOSIS — R55 Syncope and collapse: Secondary | ICD-10-CM

## 2014-11-19 DIAGNOSIS — R0600 Dyspnea, unspecified: Secondary | ICD-10-CM

## 2014-11-19 DIAGNOSIS — R42 Dizziness and giddiness: Secondary | ICD-10-CM

## 2014-11-19 MED ORDER — IOHEXOL 350 MG/ML SOLN
100.0000 mL | Freq: Once | INTRAVENOUS | Status: AC | PRN
  Administered 2014-11-19: 100 mL via INTRAVENOUS

## 2014-11-20 ENCOUNTER — Encounter: Payer: BC Managed Care – PPO | Admitting: Internal Medicine

## 2014-11-22 ENCOUNTER — Ambulatory Visit
Admission: RE | Admit: 2014-11-22 | Discharge: 2014-11-22 | Disposition: A | Discharge status: Home / Self Care | Payer: BLUE CROSS/BLUE SHIELD | Source: Ambulatory Visit | Attending: Physician Assistant | Admitting: Physician Assistant

## 2014-11-22 DIAGNOSIS — R55 Syncope and collapse: Secondary | ICD-10-CM

## 2014-11-22 DIAGNOSIS — R42 Dizziness and giddiness: Secondary | ICD-10-CM

## 2014-11-24 ENCOUNTER — Other Ambulatory Visit: Payer: Self-pay | Admitting: Physician Assistant

## 2014-11-24 DIAGNOSIS — I6521 Occlusion and stenosis of right carotid artery: Secondary | ICD-10-CM

## 2014-11-24 DIAGNOSIS — R55 Syncope and collapse: Secondary | ICD-10-CM

## 2014-11-24 DIAGNOSIS — R42 Dizziness and giddiness: Secondary | ICD-10-CM

## 2014-11-26 ENCOUNTER — Other Ambulatory Visit: Payer: Self-pay | Admitting: *Deleted

## 2014-11-26 DIAGNOSIS — I6523 Occlusion and stenosis of bilateral carotid arteries: Secondary | ICD-10-CM

## 2014-12-02 ENCOUNTER — Encounter: Payer: Self-pay | Admitting: *Deleted

## 2014-12-06 ENCOUNTER — Encounter: Payer: Self-pay | Admitting: Physician Assistant

## 2014-12-06 ENCOUNTER — Ambulatory Visit (INDEPENDENT_AMBULATORY_CARE_PROVIDER_SITE_OTHER): Payer: BLUE CROSS/BLUE SHIELD | Admitting: Physician Assistant

## 2014-12-06 VITALS — BP 110/62 | HR 88 | Temp 98.1°F | Resp 16 | Ht 62.0 in | Wt 108.0 lb

## 2014-12-06 DIAGNOSIS — R55 Syncope and collapse: Secondary | ICD-10-CM

## 2014-12-06 DIAGNOSIS — I959 Hypotension, unspecified: Secondary | ICD-10-CM

## 2014-12-06 DIAGNOSIS — R42 Dizziness and giddiness: Secondary | ICD-10-CM

## 2014-12-06 DIAGNOSIS — I10 Essential (primary) hypertension: Secondary | ICD-10-CM

## 2014-12-06 MED ORDER — SULFAMETHOXAZOLE-TRIMETHOPRIM 800-160 MG PO TABS: 1.0000 | ORAL_TABLET | Freq: Two times a day (BID) | ORAL | Status: DC

## 2014-12-06 NOTE — Patient Instructions (Addendum)
Your ears and sinuses are connected by the eustachian tube. When your sinuses are inflamed, this can close off the tube and cause fluid to collect in your middle ear. This can then cause dizziness, popping, clicking, ringing, and echoing in your ears. This is often NOT an infection and does NOT require antibiotics, it is caused by inflammation so the treatments help the inflammation. This can take a long time to get better so please be patient.  Here are things you can do to help with this: - Try the Flonase or Nasonex. Remember to spray each nostril twice towards the outer part of your eye.  Do not sniff but instead pinch your nose and tilt your head back to help the medicine get into your sinuses.  The best time to do this is at bedtime.Stop if you get blurred vision or nose bleeds.  -While drinking fluids, pinch and hold nose close and swallow, to help open eustachian tubes to drain fluid behind ear drums. -Please pick one of the over the counter allergy medications below and take it once daily for allergies.  It will also help with fluid behind ear drums. Claritin or loratadine cheapest but likely the weakest  Zyrtec or certizine at night because it can make you sleepy The strongest is allegra or fexafinadine  Cheapest at [employer], [employer], [employer] -can use decongestant over the counter, please do not use if you have high blood pressure or certain heart conditions.   if worsening HA, changes vision/speech, imbalance, weakness go to the ER  Syncope Syncope is a medical term for fainting or passing out. This means you lose consciousness and drop to the ground. People are generally unconscious for less than 5 minutes. You may have some muscle twitches for up to 15 seconds before waking up and returning to normal. Syncope occurs more often in older adults, but it can happen to anyone. While most causes of syncope are not dangerous, syncope can be a sign of a serious medical problem. It is important to seek  medical care.  CAUSES  Syncope is caused by a sudden drop in blood flow to the brain. The specific cause is often not determined. Factors that can bring on syncope include:  Taking medicines that lower blood pressure.  Sudden changes in posture, such as standing up quickly.  Taking more medicine than prescribed.  Standing in one place for too long.  Seizure disorders.  Dehydration and excessive exposure to heat.  Low blood sugar (hypoglycemia).  Straining to have a bowel movement.  Heart disease, irregular heartbeat, or other circulatory problems.  Fear, emotional distress, seeing blood, or severe pain. SYMPTOMS  Right before fainting, you may:  Feel dizzy or light-headed.  Feel nauseous.  See all white or all black in your field of vision.  Have cold, clammy skin. DIAGNOSIS  Your health care provider will ask about your symptoms, perform a physical exam, and perform an electrocardiogram (ECG) to record the electrical activity of your heart. Your health care provider may also perform other heart or blood tests to determine the cause of your syncope which may include:  Transthoracic echocardiogram (TTE). During echocardiography, sound waves are used to evaluate how blood flows through your heart.  Transesophageal echocardiogram (TEE).  Cardiac monitoring. This allows your health care provider to monitor your heart rate and rhythm in real time.  Holter monitor. This is a portable device that records your heartbeat and can help diagnose heart arrhythmias. It allows your health care provider to  track your heart activity for several days, if needed.  Stress tests by exercise or by giving medicine that makes the heart beat faster. TREATMENT  In most cases, no treatment is needed. Depending on the cause of your syncope, your health care provider may recommend changing or stopping some of your medicines. HOME CARE INSTRUCTIONS  Have someone stay with you until you feel  stable.  Do not drive, use machinery, or play sports until your health care provider says it is okay.  Keep all follow-up appointments as directed by your health care provider.  Lie down right away if you start feeling like you might faint. Breathe deeply and steadily. Wait until all the symptoms have passed.  Drink enough fluids to keep your urine clear or pale yellow.  If you are taking blood pressure or heart medicine, get up slowly and take several minutes to sit and then stand. This can reduce dizziness. SEEK IMMEDIATE MEDICAL CARE IF:   You have a severe headache.  You have unusual pain in the chest, abdomen, or back.  You are bleeding from your mouth or rectum, or you have black or tarry stool.  You have an irregular or very fast heartbeat.  You have pain with breathing.  You have repeated fainting or seizure-like jerking during an episode.  You faint when sitting or lying down.  You have confusion.  You have trouble walking.  You have severe weakness.  You have vision problems. If you fainted, call your local emergency services (911 in [place] Do not drive yourself to the hospital.  MAKE SURE YOU:  Understand these instructions.  Will watch your condition.  Will get help right away if you are not doing well or get worse. Document Released: 10/04/2005 Document Revised: 10/09/2013 Document Reviewed: 12/03/2011 [facility] Patient Information 2015 [place], [place]. This information is not intended to replace advice given to you by your health care provider. Make sure you discuss any questions you have with your health care provider.

## 2014-12-06 NOTE — Progress Notes (Signed)
Assessment and Plan: Syncope- still pending EEG and holter monitor, normal MRI, CT chest- + possible right carotid stenosis, getting evaluated by vascular and vein- if worsening HA, changes vision/speech, imbalance, weakness go to the ER Vertigo-? From sinusitis, normal neuro exam- continue ASA 325- will give zpak with refill, and instructed about effusions. Labs normal last visit, continue to increase fluids.  Smoking cessation-  commended patient for quitting and reviewed strategies for preventing relapses  Advised again not to drive due to risk of injury to self and others. Expressed understanding.  OVER 30 minutes of exam, counseling, chart review, referral performed   HPI 59 y.o.female former 18 pack year smoker, quit 06/2014, with history of HTN, COPD, preDM, hyperlipidemia, gastric ulcers presents for follow up of syncopal episode.She had syncopal episode on 11/12/2014, unknown cause. She had normal labs in the ER but abnormal CT scan  Head and neck showing no intercranial injury/fracture but nonspecific 6 mm lucency at right frontal calvarium new from 2010 (correlate labs to exclude MM/metastatic disease), chronic fx C7 and bilateral carotid bifurcations.   Ordered last visit:  EEG- not done yet CT chest- No demonstrable pulmonary embolus. Stable 7 x 7 mm nodular opacity in the superior segment of the lingula. Stability of this lesion over time is consistent with benign etiology. Areas of patchy atelectasis in the bases, slightly more on the right than on the left. Mild lower lobe bronchiectatic change bilaterally. No frank edema or consolidation. No adenopathy. Mild coronary artery calcification. MRI head- 7 mm late subacute intraparenchymal hematoma at the left temporal tip, consistent with the previous history of head trauma. This area was normal in 2010 and therefore this does not represent a cavernoma. Mild chronic small-vessel disease elsewhere throughout the brain as outlined  above, similar to the study of 2010 Carotid US- 1. Bilateral carotid bifurcation plaque, resulting in 70-99% diameter stenosis on the right, less than 50% diameter stenosis on the left. Consider vascular surgical or neurointerventional radiology consultation. SENT to vascular and vein for evaluation getting [place] on right Holter monitor- pending  Since the last visit she was advised not to drive but continues to drive. Her mom had an MI last week and she states she had no choice. She is yet again advised not to drive. She continues to have hot sweats, has dizziness with standing intermittent but notes it worse when she lays down or leans her head back, worse for the past week, + vertigo. Denies nausea, vomiting, changes in vision. She states she has had cold symptoms for 4 days, some SOB, cough non productive, runny nose, ears popping/clicking.   Past Medical History  Diagnosis Date  . GERD (gastroesophageal reflux disease)   . Duodenal ulcer   . Internal hemorrhoids   . Hx of colonic polyps   . Arthritis   . Hyperlipemia   . Family history of malignant neoplasm of gastrointestinal tract   . COPD (chronic obstructive pulmonary disease)   . Asthma   . Vitamin D deficiency   . Allergy   . Anemia   . Blood transfusion without reported diagnosis   . Cataract   . Osteoporosis   . HTN (hypertension) 10/03/2014     Allergies  Allergen Reactions  . Celebrex [Celecoxib]     GI bleed  . Crestor [Rosuvastatin]     Made legs hurt  . Penicillins     REACTION: whelps  . Pravastatin     Muscle aches      Current Outpatient Prescriptions  on File Prior to Visit  Medication Sig Dispense Refill  . acetaminophen (TYLENOL) 500 MG tablet Take 500 mg by mouth every 6 (six) hours as needed.      Marland Kitchen acetaminophen-codeine (TYLENOL #3) 300-30 MG per tablet Take 1 tablet by mouth every 6 (six) hours as needed for moderate pain. MDD: 4 tablets per day 40 tablet 0  . albuterol (PROVENTIL HFA;VENTOLIN  HFA) 108 (90 BASE) MCG/ACT inhaler Inhale 2 puffs into the lungs every 6 (six) hours as needed for wheezing or shortness of breath. 1 Inhaler 2  . alendronate (FOSAMAX) 70 MG tablet Take 70 mg by mouth every 7 (seven) days. Take with a full glass of water on an empty stomach.    . Cholecalciferol (VITAMIN D) 2000 UNITS CAPS Take 7000 units a day     . citalopram (CELEXA) 20 MG tablet TAKE 1 TABLET (20 MG TOTAL) BY MOUTH DAILY. 90 tablet 0  . ferrous sulfate 325 (65 FE) MG tablet Take 325 mg by mouth daily with breakfast.      . fexofenadine (ALLEGRA) 180 MG tablet Take 180 mg by mouth as needed.    . Fluticasone-Salmeterol (ADVAIR DISKUS) 250-50 MCG/DOSE AEPB Inhale 1 puff into the lungs every 12 (twelve) hours.    Marland Kitchen omeprazole (PRILOSEC) 40 MG capsule TAKE 1 CAPSULE TWICE A DAY 60 capsule 10  . ondansetron (ZOFRAN) 4 MG tablet Take 1 tablet (4 mg total) by mouth daily as needed for nausea or vomiting. 30 tablet 1  . sucralfate (CARAFATE) 1 G tablet 1 PILL BEFORE MEALS 2-3 TIMES DAILY AS NEEDED FOR PAIN 60 tablet 0   No current facility-administered medications on file prior to visit.    ROS: all negative except above.   Physical Exam: Filed Weights   12/06/14 0956  Weight: 108 lb (48.988 kg)   BP 110/62 mmHg  Pulse 88  Temp(Src) 98.1 F (36.7 C)  Resp 16  Ht 5\' 2"  (1.575 m)  Wt 108 lb (48.988 kg)  BMI 19.75 kg/m2 General Appearance: Well nourished, in no apparent distress. Eyes: PERRLA, EOMs, conjunctiva no swelling or erythema Sinuses: No Frontal/maxillary tenderness ENT/Mouth: Ext aud canals clear, TMs without erythema, bulging. No erythema, swelling, or exudate on post pharynx.  Tonsils not swollen or erythematous. Hearing normal.  Neck: Supple, thyroid normal.  Respiratory: Respiratory effort normal, BS equal bilaterally without rales, rhonchi, wheezing or stridor.  Cardio: RRR with no MRGs. Brisk peripheral pulses without edema.  Abdomen: Soft, + BS.  Non tender, no  guarding, rebound, hernias, masses. Lymphatics: Non tender without lymphadenopathy.  Musculoskeletal: Full ROM, 5/5 strength, normal gait.  Skin: Warm, dry without rashes, lesions, ecchymosis.  Neuro: Cranial nerves intact. Normal muscle tone, no cerebellar symptoms. Sensation intact.  Psych: Awake and oriented X 3, normal affect, Insight and Judgment appropriate.     Vicie Mutters, PA-C 10:30 AM [facility]

## 2014-12-09 ENCOUNTER — Encounter: Payer: Self-pay | Admitting: Vascular Surgery

## 2014-12-10 ENCOUNTER — Encounter: Payer: Self-pay | Admitting: Vascular Surgery

## 2014-12-10 ENCOUNTER — Other Ambulatory Visit: Payer: Self-pay

## 2014-12-10 ENCOUNTER — Ambulatory Visit (INDEPENDENT_AMBULATORY_CARE_PROVIDER_SITE_OTHER): Payer: BLUE CROSS/BLUE SHIELD | Admitting: Vascular Surgery

## 2014-12-10 ENCOUNTER — Ambulatory Visit (HOSPITAL_COMMUNITY)
Admission: RE | Admit: 2014-12-10 | Discharge: 2014-12-10 | Disposition: A | Discharge status: Home / Self Care | Payer: BLUE CROSS/BLUE SHIELD | Source: Ambulatory Visit | Attending: Vascular Surgery | Admitting: Vascular Surgery

## 2014-12-10 VITALS — BP 118/58 | HR 99 | Ht 62.0 in | Wt 102.6 lb

## 2014-12-10 DIAGNOSIS — I6523 Occlusion and stenosis of bilateral carotid arteries: Secondary | ICD-10-CM

## 2014-12-10 NOTE — Progress Notes (Signed)
Patient name: [name] MRN: [id number] DOB: 1956/02/23 Sex: female   Referred by: Collier/McKeown  Reason for referral:  Chief Complaint  Patient presents with  . New Evaluation    carotid stenosis    HISTORY OF PRESENT ILLNESS: Patient presents today for evaluation of extracranial cerebrovascular occlusive disease. She is a very pleasant 59 year old female who was in her usual state of health up until 11/12/2014. She had a syncopal episode with loss of consciousness and struck her right hand. Witnesses state that she was down for approximately 10 minutes. She was evaluated in the emergency department to include CT of her head and neck showing no acute findings. Subsequent she underwent a carotid duplex revealing high-grade right carotid stenosis. She is seeing [place] for further discussion of this. The patient denies any prior focal neurologic deficits and no similar episodes of blackouts. She does report that since this event she has had some sensation of dizziness. Also reports occasionally having some fluttering in her chest. This is all been transient. She is right-handed. She does have a long history of cigarette smoking but quit smoking in approximate 6 months ago. Very strong family history of carotid disease in her father and her mother  Past Medical History  Diagnosis Date  . GERD (gastroesophageal reflux disease)   . Duodenal ulcer   . Internal hemorrhoids   . Hx of colonic polyps   . Arthritis   . Hyperlipemia   . Family history of malignant neoplasm of gastrointestinal tract   . COPD (chronic obstructive pulmonary disease)   . Asthma   . Vitamin D deficiency   . Allergy   . Anemia   . Blood transfusion without reported diagnosis   . Cataract   . Osteoporosis   . HTN (hypertension) 10/03/2014  . Peripheral vascular disease     Past Surgical History  Procedure Laterality Date  . Abdominal hysterectomy    . Appendectomy    . Total hip arthroplasty     right  . Lumbar disc surgery    . Elbow surgery      rt  . Wrist surgery      left  . Hand surgery    . Esophagogastroduodenoscopy  10/2008  . Joint replacement Right 2007    hip  . Colonoscopy    . Upper gastrointestinal endoscopy    . Trigeminal neuralgia surgery      History   Social History  . Marital Status: Married    Spouse Name: N/A  . Number of Children: 1  . Years of Education: N/A   Occupational History  . disability    Social History Main Topics  . Smoking status: Former Smoker -- 0.50 packs/day for 36 years    Types: Cigarettes    Quit date: 06/18/2014  . Smokeless tobacco: Never Used     Comment: was taking chantix but it gave her a headache  . Alcohol Use: 0.0 oz/week    0 Standard drinks or equivalent per week     Comment: very rarely  . Drug Use: No  . Sexual Activity: Not on file   Other Topics Concern  . Not on file   Social History Narrative    Family History  Problem Relation Age of Onset  . Colon polyps Mother   . Inflammatory bowel disease Mother   . Hypertension Mother   . Stroke Mother   . Heart disease Mother   . Hyperlipidemia Mother   . Varicose Veins Mother   .  Heart attack Mother   . AAA (abdominal aortic aneurysm) Mother   . Diabetes Maternal Grandfather   . Heart disease Maternal Grandfather   . Heart disease Maternal Grandmother   . Colon cancer      mat great aunt  . Hypertension Father   . Hyperlipidemia Father   . Heart disease Father   . Heart attack Father   . AAA (abdominal aortic aneurysm) Father   . Heart disease Brother   . Hyperlipidemia Brother   . Hypertension Brother   . Heart attack Brother   . AAA (abdominal aortic aneurysm) Brother   . Esophageal cancer Neg Hx   . Stomach cancer Neg Hx   . Rectal cancer Neg Hx   . Varicose Veins Daughter     Allergies as of 12/10/2014 - Review Complete 12/10/2014  Allergen Reaction Noted  . Celebrex [celecoxib]  10/15/2013  . Crestor [rosuvastatin]   12/21/2012  . Penicillins  12/13/2008  . Pravastatin  10/15/2013    Current Outpatient Prescriptions on File Prior to Visit  Medication Sig Dispense Refill  . acetaminophen (TYLENOL) 500 MG tablet Take 500 mg by mouth every 6 (six) hours as needed.      Marland Kitchen acetaminophen-codeine (TYLENOL #3) 300-30 MG per tablet Take 1 tablet by mouth every 6 (six) hours as needed for moderate pain. MDD: 4 tablets per day 40 tablet 0  . albuterol (PROVENTIL HFA;VENTOLIN HFA) 108 (90 BASE) MCG/ACT inhaler Inhale 2 puffs into the lungs every 6 (six) hours as needed for wheezing or shortness of breath. 1 Inhaler 2  . alendronate (FOSAMAX) 70 MG tablet Take 70 mg by mouth every 7 (seven) days. Take with a full glass of water on an empty stomach.    . Cholecalciferol (VITAMIN D) 2000 UNITS CAPS Take 7000 units a day     . citalopram (CELEXA) 20 MG tablet TAKE 1 TABLET (20 MG TOTAL) BY MOUTH DAILY. 90 tablet 0  . ferrous sulfate 325 (65 FE) MG tablet Take 325 mg by mouth daily with breakfast.      . fexofenadine (ALLEGRA) 180 MG tablet Take 180 mg by mouth as needed.    . Fluticasone-Salmeterol (ADVAIR DISKUS) 250-50 MCG/DOSE AEPB Inhale 1 puff into the lungs every 12 (twelve) hours.    Marland Kitchen omeprazole (PRILOSEC) 40 MG capsule TAKE 1 CAPSULE TWICE A DAY 60 capsule 10  . ondansetron (ZOFRAN) 4 MG tablet Take 1 tablet (4 mg total) by mouth daily as needed for nausea or vomiting. 30 tablet 1  . sucralfate (CARAFATE) 1 G tablet 1 PILL BEFORE MEALS 2-3 TIMES DAILY AS NEEDED FOR PAIN 60 tablet 0  . sulfamethoxazole-trimethoprim (BACTRIM DS,SEPTRA DS) 800-160 MG per tablet Take 1 tablet by mouth 2 (two) times daily. 20 tablet 0   No current facility-administered medications on file prior to visit.     REVIEW OF SYSTEMS:  Positives indicated with an "X"  CARDIOVASCULAR:  [ ]  chest pain   [ ]  chest pressure   [x ] palpitations   [ ]  orthopnea   [ ]  dyspnea on exertion   [ ]  claudication   [ ]  rest pain   [ ]  DVT   [ ]   phlebitis PULMONARY:   [ ]  productive cough   [ ]  asthma   [ ]  wheezing NEUROLOGIC:   [ ]  weakness  [ ]  paresthesias  [ ]  aphasia  [ ]  amaurosis  [ ]  dizziness HEMATOLOGIC:   [x ] bleeding problems   [ ]   clotting disorders MUSCULOSKELETAL:  [ ]  joint pain   [ ]  joint swelling GASTROINTESTINAL: [ ]   blood in stool  [ ]   hematemesis GENITOURINARY:  [ ]   dysuria  [ ]   hematuria PSYCHIATRIC:  [ ]  history of major depression INTEGUMENTARY:  [ ]  rashes  [ ]  ulcers CONSTITUTIONAL:  [ ]  fever   [ ]  chills  PHYSICAL EXAMINATION:  General: The patient is a well-nourished female, in no acute distress. Vital signs are BP 118/58 mmHg  Pulse 99  Ht 5\' 2"  (1.575 m)  Wt 102 lb 9.6 oz (46.539 kg)  BMI 18.76 kg/m2  SpO2 100% Pulmonary: There is a good air exchange bilaterally without wheezing or rales. Abdomen: Soft and non-tender with normal pitch bowel sounds. Musculoskeletal: There are no major deformities.  There is no significant extremity pain. Neurologic: No focal weakness or paresthesias are detected, Skin: There are no ulcer or rashes noted. Psychiatric: The patient has normal affect. Cardiovascular: There is a regular rate and rhythm without significant murmur appreciated. Carotid arteries: Harsh right carotid bruit and no bruit on the left Pulse status: 2+ radial 2+ femoral and 2+ dorsalis pedis pulses bilaterally  [facility] Vascular Lab Studies:  Ordered and Independently Reviewed she  underwent repeat carotid duplex of her right carotid artery. This did confirm high-grade greater than 80% stenosis. This is the normal anatomic location and she did have normal internal carotid artery distal this. Her prior study showed this as well also showed moderate 50% left carotid stenosis  Impression and Plan:  Critical Stenosis right internal carotid artery. I discussed this at length with the patient and her friend present. I explained that in all likelihood this had no bearing on the event that she  had in late January. She did not have any focal deficits. I did explain that even if this is asymptomatic, with her high-grade stenosis would recommend endarterectomy for reduction of stroke risk. I spent the procedure to include surgery on the day of admission and probable discharge on postoperative day #1. Explained the slight risk of 1. have percent risk of stroke with surgery. She understands wished to proceed during the scheduled surgery for 12/16/2014    Inocente Krach [facility] [place] Office: [phone]

## 2014-12-12 NOTE — Pre-Procedure Instructions (Signed)
[name]  12/12/2014   Your procedure is scheduled on:  Monday, December 16, 2014 at 7:30 AM.  Report to [facility] Admitting at 5:30 AM.  Call this number if you have problems the morning of surgery: [phone]   Remember:   Do not eat food or drink liquids after midnight  Sunday, February 28.   Take these medicines the morning of surgery with A SIP OF WATER: citalopram (CELEXA),  omeprazole (PRILOSEC).                May use inhalers, please bring Albuterol inhaler to the hospital with you.               Take if needed:ondansetron [facility]), Pain medication               Stop taking Vitamins.   Do not wear jewelry, make-up or nail polish.  Do not wear lotions, powders, or perfumes.   Do not shave 48 hours prior to surgery.   Do not bring valuables to the hospital.             [facility] is not responsible for any belongings or valuables.               Contacts, dentures or bridgework may not be worn into surgery.  Leave suitcase in the car. After surgery it may be brought to your room.  For patients admitted to the hospital, discharge time is determined by your treatment team.               Patients discharged the day of surgery will not be allowed to drive home.  Name and phone number of your driver:    Special Instructions: Shower using CHG soap the night before and the morning of your surgery   Please read over the following fact sheets that you were given: Pain Booklet, Coughing and Deep Breathing, Blood Transfusion Information and Surgical Site Infection Prevention

## 2014-12-13 ENCOUNTER — Encounter (HOSPITAL_COMMUNITY)
Admission: RE | Admit: 2014-12-13 | Discharge: 2014-12-13 | Disposition: A | Discharge status: Home / Self Care | Payer: BLUE CROSS/BLUE SHIELD | Source: Ambulatory Visit | Attending: Vascular Surgery | Admitting: Vascular Surgery

## 2014-12-13 ENCOUNTER — Encounter (HOSPITAL_COMMUNITY): Payer: Self-pay

## 2014-12-13 HISTORY — DX: Reserved for inherently not codable concepts without codable children: IMO0001

## 2014-12-13 HISTORY — DX: Inflammatory liver disease, unspecified: K75.9

## 2014-12-13 HISTORY — DX: Pneumonia, unspecified organism: J18.9

## 2014-12-13 LAB — URINE MICROSCOPIC-ADD ON

## 2014-12-13 LAB — URINALYSIS, ROUTINE W REFLEX MICROSCOPIC
Bilirubin Urine: NEGATIVE
Glucose, UA: NEGATIVE mg/dL
Hgb urine dipstick: NEGATIVE
KETONES UR: NEGATIVE mg/dL
Nitrite: NEGATIVE
PROTEIN: NEGATIVE mg/dL
Specific Gravity, Urine: 1.018 (ref 1.005–1.030)
Urobilinogen, UA: 0.2 mg/dL (ref 0.0–1.0)
pH: 5 (ref 5.0–8.0)

## 2014-12-13 LAB — COMPREHENSIVE METABOLIC PANEL
ALK PHOS: 58 U/L (ref 39–117)
ALT: 10 U/L (ref 0–35)
ANION GAP: 12 (ref 5–15)
AST: 16 U/L (ref 0–37)
Albumin: 4 g/dL (ref 3.5–5.2)
BILIRUBIN TOTAL: 0.1 mg/dL — AB (ref 0.3–1.2)
BUN: 10 mg/dL (ref 6–23)
CHLORIDE: 111 mmol/L (ref 96–112)
CO2: 13 mmol/L — ABNORMAL LOW (ref 19–32)
Calcium: 8.6 mg/dL (ref 8.4–10.5)
Creatinine, Ser: 1.04 mg/dL (ref 0.50–1.10)
GFR, EST AFRICAN AMERICAN: 68 mL/min — AB (ref >90)
GFR, EST NON AFRICAN AMERICAN: 59 mL/min — AB (ref >90)
Glucose, Bld: 77 mg/dL (ref 70–99)
POTASSIUM: 4.6 mmol/L (ref 3.5–5.1)
SODIUM: 136 mmol/L (ref 135–145)
Total Protein: 7 g/dL (ref 6.0–8.3)

## 2014-12-13 LAB — CBC
HCT: 33 % — ABNORMAL LOW (ref 36.0–46.0)
Hemoglobin: 10.9 g/dL — ABNORMAL LOW (ref 12.0–15.0)
MCH: 29.9 pg (ref 26.0–34.0)
MCHC: 33 g/dL (ref 30.0–36.0)
MCV: 90.4 fL (ref 78.0–100.0)
PLATELETS: 230 10*3/uL (ref 150–400)
RBC: 3.65 MIL/uL — ABNORMAL LOW (ref 3.87–5.11)
RDW: 17 % — AB (ref 11.5–15.5)
WBC: 3.9 10*3/uL — AB (ref 4.0–10.5)

## 2014-12-13 LAB — PROTIME-INR
INR: 1.2 (ref 0.00–1.49)
PROTHROMBIN TIME: 15.3 s — AB (ref 11.6–15.2)

## 2014-12-13 LAB — SURGICAL PCR SCREEN
MRSA, PCR: NEGATIVE
STAPHYLOCOCCUS AUREUS: NEGATIVE

## 2014-12-13 LAB — APTT: aPTT: 37 seconds (ref 24–37)

## 2014-12-13 NOTE — Progress Notes (Signed)
PCP is [name]. Patient denied having any chest pain or discomfort but informed Nurse that she has been dizzy since she passed out in January 2016. Nurse asked patient if Dr. Donnetta Hutching was aware of the dizziness she was experiencing and she informed Nurse that she recently had a office visit with him and he stated that it was because of the blockage she has in her left neck. Blood pressure upon arrival to PAT was 92/44; recheck was done and it was 107/46.  Nurse instructed patient that if her dizziness became more worse then she would need to seek medical attention. Patient verbalized understanding. Patients best friend Dawn at chair side during PAT visit.

## 2014-12-14 LAB — ABO/RH: ABO/RH(D): B POS

## 2014-12-15 MED ORDER — VANCOMYCIN HCL IN DEXTROSE 1-5 GM/200ML-% IV SOLN
1000.0000 mg | INTRAVENOUS | Status: AC
  Administered 2014-12-16: 1000 mg via INTRAVENOUS
  Filled 2014-12-15: qty 200

## 2014-12-16 ENCOUNTER — Inpatient Hospital Stay (HOSPITAL_COMMUNITY): Payer: BLUE CROSS/BLUE SHIELD | Admitting: Certified Registered Nurse Anesthetist

## 2014-12-16 ENCOUNTER — Telehealth: Payer: Self-pay | Admitting: Vascular Surgery

## 2014-12-16 ENCOUNTER — Inpatient Hospital Stay (HOSPITAL_COMMUNITY)
Admission: RE | Admit: 2014-12-16 | Discharge: 2014-12-18 | DRG: 038 | Disposition: A | Discharge status: Home / Self Care | Payer: BLUE CROSS/BLUE SHIELD | Source: Ambulatory Visit | Attending: Vascular Surgery | Admitting: Vascular Surgery

## 2014-12-16 ENCOUNTER — Encounter (HOSPITAL_COMMUNITY): Payer: Self-pay | Admitting: *Deleted

## 2014-12-16 ENCOUNTER — Encounter (HOSPITAL_COMMUNITY)
Admission: RE | Disposition: A | Discharge status: Home / Self Care | Payer: Self-pay | Source: Ambulatory Visit | Attending: Vascular Surgery

## 2014-12-16 DIAGNOSIS — J45909 Unspecified asthma, uncomplicated: Secondary | ICD-10-CM | POA: Diagnosis present

## 2014-12-16 DIAGNOSIS — D62 Acute posthemorrhagic anemia: Secondary | ICD-10-CM | POA: Diagnosis not present

## 2014-12-16 DIAGNOSIS — R1013 Epigastric pain: Secondary | ICD-10-CM

## 2014-12-16 DIAGNOSIS — Z96642 Presence of left artificial hip joint: Secondary | ICD-10-CM | POA: Diagnosis present

## 2014-12-16 DIAGNOSIS — K219 Gastro-esophageal reflux disease without esophagitis: Secondary | ICD-10-CM | POA: Diagnosis present

## 2014-12-16 DIAGNOSIS — Z86010 Personal history of colonic polyps: Secondary | ICD-10-CM

## 2014-12-16 DIAGNOSIS — Z90710 Acquired absence of both cervix and uterus: Secondary | ICD-10-CM | POA: Diagnosis not present

## 2014-12-16 DIAGNOSIS — I739 Peripheral vascular disease, unspecified: Secondary | ICD-10-CM | POA: Diagnosis present

## 2014-12-16 DIAGNOSIS — E785 Hyperlipidemia, unspecified: Secondary | ICD-10-CM | POA: Diagnosis present

## 2014-12-16 DIAGNOSIS — Z8701 Personal history of pneumonia (recurrent): Secondary | ICD-10-CM | POA: Diagnosis not present

## 2014-12-16 DIAGNOSIS — Z8711 Personal history of peptic ulcer disease: Secondary | ICD-10-CM | POA: Diagnosis not present

## 2014-12-16 DIAGNOSIS — Z880 Allergy status to penicillin: Secondary | ICD-10-CM

## 2014-12-16 DIAGNOSIS — I10 Essential (primary) hypertension: Secondary | ICD-10-CM | POA: Diagnosis present

## 2014-12-16 DIAGNOSIS — Z888 Allergy status to other drugs, medicaments and biological substances status: Secondary | ICD-10-CM | POA: Diagnosis not present

## 2014-12-16 DIAGNOSIS — J449 Chronic obstructive pulmonary disease, unspecified: Secondary | ICD-10-CM | POA: Diagnosis present

## 2014-12-16 DIAGNOSIS — H269 Unspecified cataract: Secondary | ICD-10-CM | POA: Diagnosis present

## 2014-12-16 DIAGNOSIS — K810 Acute cholecystitis: Secondary | ICD-10-CM

## 2014-12-16 DIAGNOSIS — Z87891 Personal history of nicotine dependence: Secondary | ICD-10-CM | POA: Diagnosis not present

## 2014-12-16 DIAGNOSIS — I6521 Occlusion and stenosis of right carotid artery: Principal | ICD-10-CM | POA: Diagnosis present

## 2014-12-16 DIAGNOSIS — M810 Age-related osteoporosis without current pathological fracture: Secondary | ICD-10-CM | POA: Diagnosis present

## 2014-12-16 DIAGNOSIS — M1990 Unspecified osteoarthritis, unspecified site: Secondary | ICD-10-CM | POA: Diagnosis present

## 2014-12-16 DIAGNOSIS — I6529 Occlusion and stenosis of unspecified carotid artery: Secondary | ICD-10-CM | POA: Diagnosis present

## 2014-12-16 DIAGNOSIS — Z9049 Acquired absence of other specified parts of digestive tract: Secondary | ICD-10-CM | POA: Diagnosis present

## 2014-12-16 HISTORY — PX: ENDARTERECTOMY: SHX5162

## 2014-12-16 LAB — CBC
HCT: 28.2 % — ABNORMAL LOW (ref 36.0–46.0)
Hemoglobin: 9.6 g/dL — ABNORMAL LOW (ref 12.0–15.0)
MCH: 29.9 pg (ref 26.0–34.0)
MCHC: 34 g/dL (ref 30.0–36.0)
MCV: 87.9 fL (ref 78.0–100.0)
PLATELETS: 204 10*3/uL (ref 150–400)
RBC: 3.21 MIL/uL — AB (ref 3.87–5.11)
RDW: 17.4 % — AB (ref 11.5–15.5)
WBC: 4 10*3/uL (ref 4.0–10.5)

## 2014-12-16 LAB — CREATININE, SERUM
CREATININE: 0.58 mg/dL (ref 0.50–1.10)
GFR calc non Af Amer: >90 mL/min (ref >90)

## 2014-12-16 SURGERY — ENDARTERECTOMY, CAROTID
Anesthesia: General | Site: Neck | Laterality: Right

## 2014-12-16 MED ORDER — ONDANSETRON HCL 4 MG/2ML IJ SOLN
INTRAMUSCULAR | Status: DC | PRN
  Administered 2014-12-16: 4 mg via INTRAVENOUS

## 2014-12-16 MED ORDER — VANCOMYCIN HCL IN DEXTROSE 750-5 MG/150ML-% IV SOLN
750.0000 mg | INTRAVENOUS | Status: AC
  Administered 2014-12-17: 750 mg via INTRAVENOUS
  Filled 2014-12-16 (×2): qty 150

## 2014-12-16 MED ORDER — METOPROLOL TARTRATE 1 MG/ML IV SOLN: 2.0000 mg | INTRAVENOUS | Status: DC | PRN

## 2014-12-16 MED ORDER — ASPIRIN EC 81 MG PO TBEC: 81.0000 mg | DELAYED_RELEASE_TABLET | Freq: Every day | ORAL | Status: DC

## 2014-12-16 MED ORDER — FENTANYL CITRATE 0.05 MG/ML IJ SOLN
INTRAMUSCULAR | Status: DC | PRN
  Administered 2014-12-16: 25 ug via INTRAVENOUS
  Administered 2014-12-16: 100 ug via INTRAVENOUS

## 2014-12-16 MED ORDER — ALBUTEROL SULFATE (2.5 MG/3ML) 0.083% IN NEBU: 3.0000 mL | INHALATION_SOLUTION | Freq: Four times a day (QID) | RESPIRATORY_TRACT | Status: DC | PRN

## 2014-12-16 MED ORDER — METOPROLOL TARTRATE 1 MG/ML IV SOLN
INTRAVENOUS | Status: AC
  Filled 2014-12-16: qty 5

## 2014-12-16 MED ORDER — PROPOFOL 10 MG/ML IV BOLUS
INTRAVENOUS | Status: DC | PRN
  Administered 2014-12-16: 20 mg via INTRAVENOUS
  Administered 2014-12-16: 100 mg via INTRAVENOUS

## 2014-12-16 MED ORDER — METOPROLOL TARTRATE 1 MG/ML IV SOLN
INTRAVENOUS | Status: DC | PRN
  Administered 2014-12-16 (×2): 2.5 mg via INTRAVENOUS

## 2014-12-16 MED ORDER — FENTANYL CITRATE 0.05 MG/ML IJ SOLN
25.0000 ug | INTRAMUSCULAR | Status: DC | PRN
  Administered 2014-12-16 (×4): 25 ug via INTRAVENOUS

## 2014-12-16 MED ORDER — PROPOFOL 10 MG/ML IV BOLUS
INTRAVENOUS | Status: AC
  Filled 2014-12-16: qty 20

## 2014-12-16 MED ORDER — PROTAMINE SULFATE 10 MG/ML IV SOLN
INTRAVENOUS | Status: DC | PRN
  Administered 2014-12-16 (×2): 50 mg via INTRAVENOUS

## 2014-12-16 MED ORDER — DEXAMETHASONE SODIUM PHOSPHATE 4 MG/ML IJ SOLN
INTRAMUSCULAR | Status: DC | PRN
  Administered 2014-12-16: 8 mg via INTRAVENOUS

## 2014-12-16 MED ORDER — OXYCODONE HCL 5 MG/5ML PO SOLN: 5.0000 mg | Freq: Once | ORAL | Status: DC | PRN

## 2014-12-16 MED ORDER — 0.9 % SODIUM CHLORIDE (POUR BTL) OPTIME
TOPICAL | Status: DC | PRN
  Administered 2014-12-16: 200 mL

## 2014-12-16 MED ORDER — POTASSIUM CHLORIDE CRYS ER 20 MEQ PO TBCR
20.0000 meq | EXTENDED_RELEASE_TABLET | Freq: Every day | ORAL | Status: AC | PRN
  Administered 2014-12-17: 20 meq via ORAL
  Filled 2014-12-16: qty 1

## 2014-12-16 MED ORDER — OXYCODONE HCL 5 MG PO TABS: 5.0000 mg | ORAL_TABLET | Freq: Once | ORAL | Status: DC | PRN

## 2014-12-16 MED ORDER — FENTANYL CITRATE 0.05 MG/ML IJ SOLN
INTRAMUSCULAR | Status: AC
  Filled 2014-12-16: qty 5

## 2014-12-16 MED ORDER — PHENOL 1.4 % MT LIQD: 1.0000 | OROMUCOSAL | Status: DC | PRN

## 2014-12-16 MED ORDER — SODIUM CHLORIDE 0.9 % IR SOLN
Status: DC | PRN
  Administered 2014-12-16: 200 mL

## 2014-12-16 MED ORDER — GLYCOPYRROLATE 0.2 MG/ML IJ SOLN
INTRAMUSCULAR | Status: DC | PRN
  Administered 2014-12-16: 0.6 mg via INTRAVENOUS

## 2014-12-16 MED ORDER — PNEUMOCOCCAL VAC POLYVALENT 25 MCG/0.5ML IJ INJ
0.5000 mL | INJECTION | INTRAMUSCULAR | Status: AC
  Administered 2014-12-18: 0.5 mL via INTRAMUSCULAR
  Filled 2014-12-16 (×2): qty 0.5

## 2014-12-16 MED ORDER — GLYCOPYRROLATE 0.2 MG/ML IJ SOLN
INTRAMUSCULAR | Status: AC
  Filled 2014-12-16: qty 3

## 2014-12-16 MED ORDER — ACETAMINOPHEN-CODEINE #3 300-30 MG PO TABS: 1.0000 | ORAL_TABLET | Freq: Four times a day (QID) | ORAL | Status: DC | PRN

## 2014-12-16 MED ORDER — NITROGLYCERIN 0.2 MG/ML ON CALL CATH LAB
INTRAVENOUS | Status: DC | PRN
  Administered 2014-12-16 (×2): 80 ug via INTRAVENOUS

## 2014-12-16 MED ORDER — ACETAMINOPHEN 500 MG PO TABS
500.0000 mg | ORAL_TABLET | Freq: Four times a day (QID) | ORAL | Status: DC | PRN
  Administered 2014-12-17 (×3): 500 mg via ORAL
  Filled 2014-12-16 (×4): qty 1

## 2014-12-16 MED ORDER — HEPARIN SODIUM (PORCINE) 1000 UNIT/ML IJ SOLN
INTRAMUSCULAR | Status: AC
  Filled 2014-12-16: qty 1

## 2014-12-16 MED ORDER — DOPAMINE-DEXTROSE 3.2-5 MG/ML-% IV SOLN
3.0000 ug/kg/min | INTRAVENOUS | Status: DC
  Administered 2014-12-16: 3 ug/kg/min via INTRAVENOUS

## 2014-12-16 MED ORDER — CITALOPRAM HYDROBROMIDE 20 MG PO TABS: 20.0000 mg | ORAL_TABLET | Freq: Every day | ORAL | Status: DC

## 2014-12-16 MED ORDER — FERROUS SULFATE 325 (65 FE) MG PO TABS: 325.0000 mg | ORAL_TABLET | Freq: Every day | ORAL | Status: DC

## 2014-12-16 MED ORDER — DOCUSATE SODIUM 100 MG PO CAPS
100.0000 mg | ORAL_CAPSULE | Freq: Every day | ORAL | Status: DC
  Administered 2014-12-17 – 2014-12-18 (×2): 100 mg via ORAL
  Filled 2014-12-16 (×2): qty 1

## 2014-12-16 MED ORDER — BISACODYL 10 MG RE SUPP: 10.0000 mg | Freq: Every day | RECTAL | Status: DC | PRN

## 2014-12-16 MED ORDER — HYDRALAZINE HCL 20 MG/ML IJ SOLN: 5.0000 mg | INTRAMUSCULAR | Status: DC | PRN

## 2014-12-16 MED ORDER — SODIUM CHLORIDE 0.9 % IV SOLN
10.0000 mg | INTRAVENOUS | Status: DC | PRN
  Administered 2014-12-16: 10 ug/min via INTRAVENOUS

## 2014-12-16 MED ORDER — HYDRALAZINE HCL 20 MG/ML IJ SOLN
INTRAMUSCULAR | Status: AC
  Filled 2014-12-16: qty 1

## 2014-12-16 MED ORDER — FENTANYL CITRATE 0.05 MG/ML IJ SOLN
INTRAMUSCULAR | Status: AC
  Administered 2014-12-16: 25 ug via INTRAVENOUS
  Filled 2014-12-16: qty 2

## 2014-12-16 MED ORDER — LACTATED RINGERS IV SOLN
INTRAVENOUS | Status: DC | PRN
  Administered 2014-12-16 (×2): via INTRAVENOUS

## 2014-12-16 MED ORDER — MAGNESIUM SULFATE 2 GM/50ML IV SOLN: 2.0000 g | Freq: Every day | INTRAVENOUS | Status: DC | PRN

## 2014-12-16 MED ORDER — PANTOPRAZOLE SODIUM 40 MG PO TBEC
80.0000 mg | DELAYED_RELEASE_TABLET | Freq: Every day | ORAL | Status: DC
  Administered 2014-12-17 – 2014-12-18 (×2): 80 mg via ORAL
  Filled 2014-12-16: qty 2

## 2014-12-16 MED ORDER — ALENDRONATE SODIUM 70 MG PO TABS: 70.0000 mg | ORAL_TABLET | ORAL | Status: DC

## 2014-12-16 MED ORDER — DEXAMETHASONE SODIUM PHOSPHATE 4 MG/ML IJ SOLN
INTRAMUSCULAR | Status: AC
  Filled 2014-12-16: qty 2

## 2014-12-16 MED ORDER — ALUM & MAG HYDROXIDE-SIMETH 200-200-20 MG/5ML PO SUSP: 15.0000 mL | ORAL | Status: DC | PRN

## 2014-12-16 MED ORDER — HYDRALAZINE HCL 20 MG/ML IJ SOLN
INTRAMUSCULAR | Status: DC | PRN
  Administered 2014-12-16: 5 mg via INTRAVENOUS

## 2014-12-16 MED ORDER — LIDOCAINE HCL (PF) 1 % IJ SOLN
INTRAMUSCULAR | Status: AC
  Filled 2014-12-16: qty 30

## 2014-12-16 MED ORDER — ONDANSETRON HCL 4 MG PO TABS: 4.0000 mg | ORAL_TABLET | Freq: Every day | ORAL | Status: DC | PRN

## 2014-12-16 MED ORDER — VITAMIN D3 25 MCG (1000 UNIT) PO TABS
7000.0000 [IU] | ORAL_TABLET | Freq: Every day | ORAL | Status: DC
  Administered 2014-12-16 – 2014-12-18 (×3): 7000 [IU] via ORAL
  Filled 2014-12-16 (×3): qty 7

## 2014-12-16 MED ORDER — ENOXAPARIN SODIUM 40 MG/0.4ML ~~LOC~~ SOLN
40.0000 mg | SUBCUTANEOUS | Status: DC
  Administered 2014-12-17: 40 mg via SUBCUTANEOUS
  Filled 2014-12-16 (×2): qty 0.4

## 2014-12-16 MED ORDER — ONDANSETRON HCL 4 MG/2ML IJ SOLN: 4.0000 mg | Freq: Once | INTRAMUSCULAR | Status: DC | PRN

## 2014-12-16 MED ORDER — SODIUM CHLORIDE 0.9 % IV SOLN: INTRAVENOUS | Status: DC

## 2014-12-16 MED ORDER — ONDANSETRON HCL 4 MG/2ML IJ SOLN: 4.0000 mg | Freq: Four times a day (QID) | INTRAMUSCULAR | Status: DC | PRN

## 2014-12-16 MED ORDER — ASPIRIN EC 81 MG PO TBEC
81.0000 mg | DELAYED_RELEASE_TABLET | Freq: Every day | ORAL | Status: DC
  Administered 2014-12-16 – 2014-12-18 (×3): 81 mg via ORAL
  Filled 2014-12-16 (×3): qty 1

## 2014-12-16 MED ORDER — HEPARIN SODIUM (PORCINE) 1000 UNIT/ML IJ SOLN
INTRAMUSCULAR | Status: DC | PRN
  Administered 2014-12-16: 5000 [IU] via INTRAVENOUS

## 2014-12-16 MED ORDER — NEOSTIGMINE METHYLSULFATE 10 MG/10ML IV SOLN
INTRAVENOUS | Status: DC | PRN
  Administered 2014-12-16: 4 mg via INTRAVENOUS

## 2014-12-16 MED ORDER — MORPHINE SULFATE 2 MG/ML IJ SOLN
2.0000 mg | INTRAMUSCULAR | Status: DC | PRN
  Administered 2014-12-16: 2 mg via INTRAVENOUS
  Filled 2014-12-16: qty 1

## 2014-12-16 MED ORDER — SODIUM CHLORIDE 0.9 % IV SOLN
INTRAVENOUS | Status: DC
  Administered 2014-12-16: 75 mL/h via INTRAVENOUS
  Administered 2014-12-17: 09:00:00 via INTRAVENOUS

## 2014-12-16 MED ORDER — ROCURONIUM BROMIDE 100 MG/10ML IV SOLN
INTRAVENOUS | Status: DC | PRN
  Administered 2014-12-16: 40 mg via INTRAVENOUS

## 2014-12-16 MED ORDER — LABETALOL HCL 5 MG/ML IV SOLN: 10.0000 mg | INTRAVENOUS | Status: DC | PRN

## 2014-12-16 MED ORDER — DOPAMINE-DEXTROSE 3.2-5 MG/ML-% IV SOLN
INTRAVENOUS | Status: AC
  Filled 2014-12-16: qty 250

## 2014-12-16 MED ORDER — CHLORHEXIDINE GLUCONATE CLOTH 2 % EX PADS: 6.0000 | MEDICATED_PAD | Freq: Once | CUTANEOUS | Status: DC

## 2014-12-16 MED ORDER — MOMETASONE FURO-FORMOTEROL FUM 100-5 MCG/ACT IN AERO
2.0000 | INHALATION_SPRAY | Freq: Two times a day (BID) | RESPIRATORY_TRACT | Status: DC
  Administered 2014-12-16 – 2014-12-18 (×4): 2 via RESPIRATORY_TRACT
  Filled 2014-12-16: qty 8.8

## 2014-12-16 MED ORDER — LIDOCAINE HCL (CARDIAC) 20 MG/ML IV SOLN
INTRAVENOUS | Status: DC | PRN
  Administered 2014-12-16: 40 mg via INTRAVENOUS

## 2014-12-16 MED ORDER — SODIUM CHLORIDE 0.9 % IV SOLN
500.0000 mL | Freq: Once | INTRAVENOUS | Status: AC | PRN
  Administered 2014-12-16 (×2): 500 mL via INTRAVENOUS

## 2014-12-16 MED ORDER — OXYCODONE-ACETAMINOPHEN 5-325 MG PO TABS
1.0000 | ORAL_TABLET | ORAL | Status: DC | PRN
Start: 2014-12-16 — End: 2014-12-18
  Administered 2014-12-16 – 2014-12-18 (×4): 1 via ORAL
  Filled 2014-12-16 (×4): qty 1

## 2014-12-16 MED ORDER — LORATADINE 10 MG PO TABS
10.0000 mg | ORAL_TABLET | Freq: Every day | ORAL | Status: DC
  Administered 2014-12-16 – 2014-12-18 (×3): 10 mg via ORAL
  Filled 2014-12-16 (×3): qty 1

## 2014-12-16 MED ORDER — GUAIFENESIN-DM 100-10 MG/5ML PO SYRP: 15.0000 mL | ORAL_SOLUTION | ORAL | Status: DC | PRN

## 2014-12-16 MED ORDER — ARTIFICIAL TEARS OP OINT
TOPICAL_OINTMENT | OPHTHALMIC | Status: DC | PRN
  Administered 2014-12-16: 1 via OPHTHALMIC

## 2014-12-16 SURGICAL SUPPLY — 47 items
APL SKNCLS STERI-STRIP NONHPOA (GAUZE/BANDAGES/DRESSINGS) ×1
BENZOIN TINCTURE PRP APPL 2/3 (GAUZE/BANDAGES/DRESSINGS) ×2 IMPLANT
BLADE SURG 10 STRL SS (BLADE) ×2 IMPLANT
CANISTER SUCTION 2500CC (MISCELLANEOUS) ×2 IMPLANT
CANNULA VESSEL 3MM 2 BLNT TIP (CANNULA) ×1 IMPLANT
CATH ROBINSON RED A/P 18FR (CATHETERS) ×2 IMPLANT
CLIP LIGATING EXTRA MED SLVR (CLIP) ×2 IMPLANT
CLIP LIGATING EXTRA SM BLUE (MISCELLANEOUS) ×2 IMPLANT
CRADLE DONUT ADULT HEAD (MISCELLANEOUS) ×2 IMPLANT
DECANTER SPIKE VIAL GLASS SM (MISCELLANEOUS) ×2 IMPLANT
DRAIN HEMOVAC 1/8 X 5 (WOUND CARE) IMPLANT
DRSG COVADERM 4X6 (GAUZE/BANDAGES/DRESSINGS) ×1 IMPLANT
DRSG COVADERM 4X8 (GAUZE/BANDAGES/DRESSINGS) IMPLANT
ELECT REM PT RETURN 9FT ADLT (ELECTROSURGICAL) ×2
ELECTRODE REM PT RTRN 9FT ADLT (ELECTROSURGICAL) ×1 IMPLANT
EVACUATOR SILICONE 100CC (DRAIN) IMPLANT
GAUZE SPONGE 4X4 12PLY STRL (GAUZE/BANDAGES/DRESSINGS) ×2 IMPLANT
GEL ULTRASOUND 20GR AQUASONIC (MISCELLANEOUS) ×1 IMPLANT
GLOVE BIO SURGEON STRL SZ 6.5 (GLOVE) ×6 IMPLANT
GLOVE BIOGEL PI IND STRL 7.0 (GLOVE) IMPLANT
GLOVE BIOGEL PI INDICATOR 7.0 (GLOVE) ×2
GLOVE SS BIOGEL STRL SZ 7.5 (GLOVE) ×1 IMPLANT
GLOVE SUPERSENSE BIOGEL SZ 7.5 (GLOVE) ×1
GOWN STRL REUS W/ TWL LRG LVL3 (GOWN DISPOSABLE) ×3 IMPLANT
GOWN STRL REUS W/TWL LRG LVL3 (GOWN DISPOSABLE) ×6
KIT BASIN OR (CUSTOM PROCEDURE TRAY) ×2 IMPLANT
KIT ROOM TURNOVER OR (KITS) ×2 IMPLANT
NEEDLE 22X1 1/2 (OR ONLY) (NEEDLE) IMPLANT
NS IRRIG 1000ML POUR BTL (IV SOLUTION) ×4 IMPLANT
PACK CAROTID (CUSTOM PROCEDURE TRAY) ×2 IMPLANT
PAD ARMBOARD 7.5X6 YLW CONV (MISCELLANEOUS) ×4 IMPLANT
PATCH HEMASHIELD 8X150 (Vascular Products) ×1 IMPLANT
PROBE PENCIL 8 MHZ STRL DISP (MISCELLANEOUS) IMPLANT
SHUNT CAROTID BYPASS 10 (VASCULAR PRODUCTS) ×1 IMPLANT
SHUNT CAROTID BYPASS 12FRX15.5 (VASCULAR PRODUCTS) IMPLANT
SPONGE GAUZE 4X4 12PLY STER LF (GAUZE/BANDAGES/DRESSINGS) ×1 IMPLANT
SPONGE INTESTINAL PEANUT (DISPOSABLE) ×2 IMPLANT
STRIP CLOSURE SKIN 1/2X4 (GAUZE/BANDAGES/DRESSINGS) ×2 IMPLANT
SUT ETHILON 3 0 PS 1 (SUTURE) IMPLANT
SUT PROLENE 6 0 CC (SUTURE) ×2 IMPLANT
SUT SILK 3 0 (SUTURE)
SUT SILK 3-0 18XBRD TIE 12 (SUTURE) IMPLANT
SUT VIC AB 3-0 SH 27 (SUTURE) ×6
SUT VIC AB 3-0 SH 27X BRD (SUTURE) ×3 IMPLANT
SUT VICRYL 4-0 PS2 18IN ABS (SUTURE) ×2 IMPLANT
SYR CONTROL 10ML LL (SYRINGE) IMPLANT
WATER STERILE IRR 1000ML POUR (IV SOLUTION) ×2 IMPLANT

## 2014-12-16 NOTE — Progress Notes (Signed)
Pharmacy: Antibiotic Renal Dose Adjustment  58yof s/p right carotid endarterectomy with dacron patch angioplasty to continue vancomycin post-op for surgical prophylaxis x 24 hours. Pre-op dose of 1gm given at 0750. Post-op dose of 1gm q12 ordered, but patient's weight only 47kg and CrCl 55ml/min so will adjust dose.   Plan: 1) Change vancomycin to 750mg  q24 x 1 dose - to be given 3/1 @ 0700. 2) Pharmacy will sign off, please re-consult if needed  Nena Jordan, PharmD, BCPS 12/16/2014, 4:40 PM

## 2014-12-16 NOTE — Progress Notes (Signed)
Utilization review completed.  

## 2014-12-16 NOTE — Transfer of Care (Signed)
Immediate Anesthesia Transfer of Care Note  Patient: [name]  Procedure(s) Performed: Procedure(s): ENDARTERECTOMY CAROTID with Dacron patch (Right)  Patient Location: PACU  Anesthesia Type:General  Level of Consciousness: awake, alert , oriented and patient cooperative  Airway & Oxygen Therapy: Patient Spontanous Breathing and Patient connected to nasal cannula oxygen  Post-op Assessment: Report given to RN, Post -op Vital signs reviewed and stable, Patient moving all extremities, Patient moving all extremities X 4 and Patient able to stick tongue midline  Post vital signs: Reviewed and stable  Last Vitals:  Filed Vitals:   12/16/14 0548  BP: 95/51  Pulse: 76  Temp: 36.8 C  Resp: 18    Complications: No apparent anesthesia complications

## 2014-12-16 NOTE — Telephone Encounter (Signed)
Phone does not offer VM- mailed appt letter, dpm

## 2014-12-16 NOTE — Op Note (Signed)
Patient name: [name] MRN: [id number] DOB: 07/12/1956 Sex: female  12/16/2014 Pre-operative Diagnosis: Asymptomatic right carotid stenosis Post-operative diagnosis:  Same Surgeon:  Larina Earthly, M.D. Assistants:  Greggory Stallion Procedure:    right carotid Endarterectomy with Dacron patch angioplasty Anesthesia:  General Blood Loss:  See anesthesia record   Indications for surgery:  Severe asymptomatic  Procedure in detail:  The patient was taken to the operating and placed in the supine position. The neck was prepped and draped in the usual sterile fashion. An incision was made anterior to the sternocleidomastoid muscle and continued with electrocautery through the platysma muscle. The muscle was retracted posteriorly and the carotid sheath was opened. The facial vein was ligated with 2-0 silk ties and divided. The common carotid artery was encircled with an umbilical tape and Rummel tourniquet. Dissection was continued onto the carotid bifurcation. The superior thyroid artery was controlled with a 2-0 silk Potts tie. The external carotid organ was encircled with a vessel loop and the internal carotid was encircled with umbilical tape and Rummel tourniquet. The hypoglossal and vagus nerves were identified and preserved.  The patient was given systemic heparinization. After adequate circulation time, the internal,external and common carotid arteries were occluded. The common carotid was opened with an 11 blade and the arteriotomy was continued with Potts scissors onto the internal carotid artery. A 10 shunt was passed up the internal carotid artery, allowed to back bleed, and then passed down the common carotid artery. The shunt was secured with Rummel tourniquet. The endarterectomy was begun on the common carotid artery  plaque was divided proximally with Potts scissors. The endarterectomy was continued onto the carotid bifurcation. The external carotid was endarterectomized by eversion  technique and the internal carotid artery was endarterectomized in an open fashion. Remaining debris was removed from the endarterectomy plane. A Dacron patch was brought to the field and sewn as a patch angioplasty. Prior to completing the anastomosis, the shunt was removed and the usual flushing maneuvers were undertaken. The anastomosis was then completed and flow was restored first to the external and then the internal carotid artery. Excellent flow characteristics were noted with hand-held Doppler in the internal and external carotid arteries.  The patient was given protamine to reverse the heparin. Hemostasis was obtained with electrocautery. The wounds were irrigated with saline. The wound was closed by first reapproximating the sternocleidomastoid muscle over the carotid artery with interrupted 3-0 Vicryl sutures. Next, the platysma was closed with a running 3-0 Vicryl suture. The skin was closed with a 4-0 subcuticular Vicryl suture. Benzoin and Steri-Strips were applied to the incision. A sterile dressing was placed over the incision. All sponge and needle counts were correct. The patient was awakened in the operating room, neurologically intact. They were transferred to the PACU in stable condition.  Carotid stenosis at surgery:>80%  Disposition:  To PACU in stable condition,neurologically intact  Relevant Operative Details:  Normal ICA above bifurcation  Larina Earthly, M.D. [facility] [place] Office: [phone] Pager:  [phone]

## 2014-12-16 NOTE — Anesthesia Postprocedure Evaluation (Signed)
  Anesthesia Post-op Note  Patient: [name]  Procedure(s) Performed: Procedure(s): ENDARTERECTOMY CAROTID with Dacron patch (Right)  Patient Location: PACU  Anesthesia Type: General   Level of Consciousness: awake, alert  and oriented  Airway and Oxygen Therapy: Patient Spontanous Breathing  Post-op Pain: mild  Post-op Assessment: Post-op Vital signs reviewed  Post-op Vital Signs: Reviewed  Last Vitals:  Filed Vitals:   12/16/14 1613  BP: 98/52  Pulse: 81  Temp: 37.2 C  Resp: 15    Complications: No apparent anesthesia complications

## 2014-12-16 NOTE — Interval H&P Note (Signed)
History and Physical Interval Note:  12/16/2014 7:02 AM  [name]  has presented today for surgery, with the diagnosis of Right internal carotid artery stenosis I65.21  The various methods of treatment have been discussed with the patient and family. After consideration of risks, benefits and other options for treatment, the patient has consented to  Procedure(s): ENDARTERECTOMY CAROTID (Right) as a surgical intervention .  The patient's history has been reviewed, patient examined, no change in status, stable for surgery.  I have reviewed the patient's chart and labs.  Questions were answered to the patient's satisfaction.     Caprice Wasko

## 2014-12-16 NOTE — Telephone Encounter (Signed)
-----   Message from Mena Goes, RN sent at 12/16/2014  3:44 PM EST ----- Regarding: Schedule   ----- Message -----    From: Alvia Grove, PA-C    Sent: 12/16/2014   2:41 PM      To: [facility] Charge Pool  S/p right CEA 12/16/14  F/u with Dr. Donnetta Hutching in 2 weeks.  Thanks Maudie Mercury

## 2014-12-16 NOTE — Anesthesia Preprocedure Evaluation (Signed)
Anesthesia Evaluation  Patient identified by MRN, date of birth, ID band Patient awake    Reviewed: Allergy & Precautions, NPO status , Patient's Chart, lab work & pertinent test results  Airway Mallampati: II  TM Distance: >3 FB Neck ROM: Full    Dental  (+) Edentulous Upper, Edentulous Lower   Pulmonary Current Smoker,  breath sounds clear to auscultation        Cardiovascular hypertension, Rhythm:Regular Rate:Normal     Neuro/Psych    GI/Hepatic   Endo/Other    Renal/GU      Musculoskeletal   Abdominal   Peds  Hematology   Anesthesia Other Findings   Reproductive/Obstetrics                             Anesthesia Physical Anesthesia Plan  ASA: III  Anesthesia Plan: General   Post-op Pain Management:    Induction: Intravenous  Airway Management Planned: Oral ETT  Additional Equipment:   Intra-op Plan:   Post-operative Plan: Extubation in OR  Informed Consent: I have reviewed the patients History and Physical, chart, labs and discussed the procedure including the risks, benefits and alternatives for the proposed anesthesia with the patient or authorized representative who has indicated his/her understanding and acceptance.   Dental advisory given  Plan Discussed with: CRNA and Anesthesiologist  Anesthesia Plan Comments: (R. Carotid stenosis H/O Syncope Smoker/COPD  Plan GA with oral ETT and art line  Roberts Gaudy)        Anesthesia Quick Evaluation

## 2014-12-16 NOTE — Progress Notes (Signed)
  [facility] Progress Note  12/16/2014 2:39 PM Day of Surgery  S: Patient seen in PACU. Having some pain with incision. Throat feels sore and dry.  Exam: Tongue midline. No smile asymmetry. 5/5 strength upper and lower extremities. Right neck dressing clean. No palpable hematoma.  A/P: right CEA Day of surgery  -Neuro exam intact. -Advance diet as tolerated. -To 3S when bed available.     Virgina Jock, PA-C [facility] Office: [phone] Pager: [phone] 12/16/2014 2:39 PM

## 2014-12-16 NOTE — H&P (View-Only) (Signed)
Patient name: [name] MRN: [id number] DOB: 1956/02/23 Sex: female   Referred by: Collier/[facility]Ke[facility]  Reason for referral:  Chief Complaint  Patient presents with  . New Evaluation    carotid stenosis    HISTORY OF PRESENT ILLNESS: Patient presents today for evaluation of extracranial cerebrovascular occlusive disease. She is a very pleasant 59 year old female who was in her usual state of health up until 11/12/2014. She had a syncopal episode with loss of consciousness and struck her right hand. Witnesses state that she was down for approximately 10 minutes. She was evaluated in the emergency department to include CT of her head and neck showing no acute findings. Subsequent she underwent a carotid duplex revealing high-grade right carotid stenosis. She is seeing [place] for further discussion of this. The patient denies any prior focal neurologic deficits and no similar episodes of blackouts. She does report that since this event she has had some sensation of dizziness. Also reports occasionally having some fluttering in her chest. This is all been transient. She is right-handed. She does have a long history of cigarette smoking but quit smoking in approximate 6 months ago. Very strong family history of carotid disease in her father and her mother  Past Medical History  Diagnosis Date  . GERD (gastroesophageal reflux disease)   . Duodenal ulcer   . Internal hemorrhoids   . Hx of colonic polyps   . Arthritis   . Hyperlipemia   . Family history of malignant neoplasm of gastrointestinal tract   . COPD (chronic obstructive pulmonary disease)   . Asthma   . Vitamin D deficiency   . Allergy   . Anemia   . Blood transfusion without reported diagnosis   . Cataract   . Osteoporosis   . HTN (hypertension) 10/03/2014  . Peripheral vascular disease     Past Surgical History  Procedure Laterality Date  . Abdominal hysterectomy    . Appendectomy    . Total hip arthroplasty     right  . Lumbar disc surgery    . Elbow surgery      rt  . Wrist surgery      left  . Hand surgery    . Esophagogastroduodenoscopy  10/2008  . Joint replacement Right 2007    hip  . Colonoscopy    . Upper gastrointestinal endoscopy    . Trigeminal neuralgia surgery      History   Social History  . Marital Status: Married    Spouse Name: N/A  . Number of Children: 1  . Years of Education: N/A   Occupational History  . disability    Social History Main Topics  . Smoking status: Former Smoker -- 0.50 packs/day for 36 years    Types: Cigarettes    Quit date: 06/18/2014  . Smokeless tobacco: Never Used     Comment: was taking chantix but it gave her a headache  . Alcohol Use: 0.0 oz/week    0 Standard drinks or equivalent per week     Comment: very rarely  . Drug Use: No  . Sexual Activity: Not on file   Other Topics Concern  . Not on file   Social History Narrative    Family History  Problem Relation Age of Onset  . Colon polyps Mother   . Inflammatory bowel disease Mother   . Hypertension Mother   . Stroke Mother   . Heart disease Mother   . Hyperlipidemia Mother   . Varicose Veins Mother   .  Heart attack Mother   . AAA (abdominal aortic aneurysm) Mother   . Diabetes Maternal Grandfather   . Heart disease Maternal Grandfather   . Heart disease Maternal Grandmother   . Colon cancer      mat great aunt  . Hypertension Father   . Hyperlipidemia Father   . Heart disease Father   . Heart attack Father   . AAA (abdominal aortic aneurysm) Father   . Heart disease Brother   . Hyperlipidemia Brother   . Hypertension Brother   . Heart attack Brother   . AAA (abdominal aortic aneurysm) Brother   . Esophageal cancer Neg Hx   . Stomach cancer Neg Hx   . Rectal cancer Neg Hx   . Varicose Veins Daughter     Allergies as of 12/10/2014 - Review Complete 12/10/2014  Allergen Reaction Noted  . Celebrex [celecoxib]  10/15/2013  . Crestor [rosuvastatin]   12/21/2012  . Penicillins  12/13/2008  . Pravastatin  10/15/2013    Current Outpatient Prescriptions on File Prior to Visit  Medication Sig Dispense Refill  . acetaminophen (TYLENOL) 500 MG tablet Take 500 mg by mouth every 6 (six) hours as needed.      Marland Kitchen acetaminophen-codeine (TYLENOL #3) 300-30 MG per tablet Take 1 tablet by mouth every 6 (six) hours as needed for moderate pain. MDD: 4 tablets per day 40 tablet 0  . albuterol (PROVENTIL HFA;VENTOLIN HFA) 108 (90 BASE) MCG/ACT inhaler Inhale 2 puffs into the lungs every 6 (six) hours as needed for wheezing or shortness of breath. 1 Inhaler 2  . alendronate (FOSAMAX) 70 MG tablet Take 70 mg by mouth every 7 (seven) days. Take with a full glass of water on an empty stomach.    . Cholecalciferol (VITAMIN D) 2000 UNITS CAPS Take 7000 units a day     . citalopram (CELEXA) 20 MG tablet TAKE 1 TABLET (20 MG TOTAL) BY MOUTH DAILY. 90 tablet 0  . ferrous sulfate 325 (65 FE) MG tablet Take 325 mg by mouth daily with breakfast.      . fexofenadine (ALLEGRA) 180 MG tablet Take 180 mg by mouth as needed.    . Fluticasone-Salmeterol (ADVAIR DISKUS) 250-50 MCG/DOSE AEPB Inhale 1 puff into the lungs every 12 (twelve) hours.    Marland Kitchen omeprazole (PRILOSEC) 40 MG capsule TAKE 1 CAPSULE TWICE A DAY 60 capsule 10  . ondansetron (ZOFRAN) 4 MG tablet Take 1 tablet (4 mg total) by mouth daily as needed for nausea or vomiting. 30 tablet 1  . sucralfate (CARAFATE) 1 G tablet 1 PILL BEFORE MEALS 2-3 TIMES DAILY AS NEEDED FOR PAIN 60 tablet 0  . sulfamethoxazole-trimethoprim (BACTRIM DS,SEPTRA DS) 800-160 MG per tablet Take 1 tablet by mouth 2 (two) times daily. 20 tablet 0   No current facility-administered medications on file prior to visit.     REVIEW OF SYSTEMS:  Positives indicated with an "X"  CARDIOVASCULAR:  [ ]  chest pain   [ ]  chest pressure   [x ] palpitations   [ ]  orthopnea   [ ]  dyspnea on exertion   [ ]  claudication   [ ]  rest pain   [ ]  DVT   [ ]   phlebitis PULMONARY:   [ ]  productive cough   [ ]  asthma   [ ]  wheezing NEUROLOGIC:   [ ]  weakness  [ ]  paresthesias  [ ]  aphasia  [ ]  amaurosis  [ ]  dizziness HEMATOLOGIC:   [x ] bleeding problems   [ ]   clotting disorders MUSCULOSKELETAL:  [ ]  joint pain   [ ]  joint swelling GASTROINTESTINAL: [ ]   blood in stool  [ ]   hematemesis GENITOURINARY:  [ ]   dysuria  [ ]   hematuria PSYCHIATRIC:  [ ]  history of major depression INTEGUMENTARY:  [ ]  rashes  [ ]  ulcers CONSTITUTIONAL:  [ ]  fever   [ ]  chills  PHYSICAL EXAMINATION:  General: The patient is a well-nourished female, in no acute distress. Vital signs are BP 118/58 mmHg  Pulse 99  Ht 5\' 2"  (1.575 m)  Wt 102 lb 9.6 oz (46.539 kg)  BMI 18.76 kg/m2  SpO2 100% Pulmonary: There is a good air exchange bilaterally without wheezing or rales. Abdomen: Soft and non-tender with normal pitch bowel sounds. Musculoskeletal: There are no major deformities.  There is no significant extremity pain. Neurologic: No focal weakness or paresthesias are detected, Skin: There are no ulcer or rashes noted. Psychiatric: The patient has normal affect. Cardiovascular: There is a regular rate and rhythm without significant murmur appreciated. Carotid arteries: Harsh right carotid bruit and no bruit on the left Pulse status: 2+ radial 2+ femoral and 2+ dorsalis pedis pulses bilaterally  [facility] Vascular Lab Studies:  Ordered and Independently Reviewed she  underwent repeat carotid duplex of her right carotid artery. This did confirm high-grade greater than 80% stenosis. This is the normal anatomic location and she did have normal internal carotid artery distal this. Her prior study showed this as well also showed moderate 50% left carotid stenosis  Impression and Plan:  Critical Stenosis right internal carotid artery. I discussed this at length with the patient and her friend present. I explained that in all likelihood this had no bearing on the event that she  had in late January. She did not have any focal deficits. I did explain that even if this is asymptomatic, with her high-grade stenosis would recommend endarterectomy for reduction of stroke risk. I spent the procedure to include surgery on the day of admission and probable discharge on postoperative day #1. Explained the slight risk of 1. have percent risk of stroke with surgery. She understands wished to proceed during the scheduled surgery for 12/16/2014    Inocente Krach [facility] [place] Office: [phone]

## 2014-12-17 ENCOUNTER — Encounter (HOSPITAL_COMMUNITY): Payer: Self-pay | Admitting: Vascular Surgery

## 2014-12-17 LAB — BASIC METABOLIC PANEL
Anion gap: 6 (ref 5–15)
BUN: <5 mg/dL — ABNORMAL LOW (ref 6–23)
CO2: 22 mmol/L (ref 19–32)
Calcium: 8.1 mg/dL — ABNORMAL LOW (ref 8.4–10.5)
Chloride: 111 mmol/L (ref 96–112)
Creatinine, Ser: 0.41 mg/dL — ABNORMAL LOW (ref 0.50–1.10)
GFR calc non Af Amer: >90 mL/min (ref >90)
Glucose, Bld: 91 mg/dL (ref 70–99)
POTASSIUM: 3.4 mmol/L — AB (ref 3.5–5.1)
SODIUM: 139 mmol/L (ref 135–145)

## 2014-12-17 LAB — CBC
HCT: 24.4 % — ABNORMAL LOW (ref 36.0–46.0)
Hemoglobin: 8.2 g/dL — ABNORMAL LOW (ref 12.0–15.0)
MCH: 29.8 pg (ref 26.0–34.0)
MCHC: 33.6 g/dL (ref 30.0–36.0)
MCV: 88.7 fL (ref 78.0–100.0)
PLATELETS: 161 10*3/uL (ref 150–400)
RBC: 2.75 MIL/uL — ABNORMAL LOW (ref 3.87–5.11)
RDW: 17.2 % — AB (ref 11.5–15.5)
WBC: 4.5 10*3/uL (ref 4.0–10.5)

## 2014-12-17 LAB — PREPARE RBC (CROSSMATCH)

## 2014-12-17 MED ORDER — FUROSEMIDE 10 MG/ML IJ SOLN
20.0000 mg | Freq: Once | INTRAMUSCULAR | Status: DC
  Filled 2014-12-17: qty 2

## 2014-12-17 MED ORDER — SODIUM CHLORIDE 0.9 % IV SOLN
Freq: Once | INTRAVENOUS | Status: AC
  Administered 2014-12-17: 10:00:00 via INTRAVENOUS

## 2014-12-17 NOTE — Progress Notes (Signed)
Subjective: Interval History: none.. Reports some soreness in right neck.  Objective: Vital signs in last 24 hours: Temp:  [97.8 F (36.6 C)-99.4 F (37.4 C)] 98.1 F (36.7 C) (03/01 0305) Pulse Rate:  [64-93] 72 (03/01 0600) Resp:  [8-25] 11 (03/01 0600) BP: (82-113)/(37-79) 100/48 mmHg (03/01 0600) SpO2:  [88 %-100 %] 96 % (03/01 0600) Arterial Line BP: (93-137)/(36-59) 120/46 mmHg (03/01 0600) Weight:  [114 lb 10.2 oz (52 kg)] 114 lb 10.2 oz (52 kg) (02/29 1613)  Intake/Output from previous day: 02/29 0701 - 03/01 0700 In: 2710 [P.O.:460; I.V.:2250] Out: 1700 [Urine:1600; Blood:100] Intake/Output this shift: Total I/O In: 360 [P.O.:360] Out: -   Neck incision without hematoma, neuro exam intact.  Lab Results:  Recent Labs  12/16/14 1930 12/17/14 0419  WBC 4.0 4.5  HGB 9.6* 8.2*  HCT 28.2* 24.4*  PLT 204 161   BMET  Recent Labs  12/16/14 1930 12/17/14 0419  NA  --  139  K  --  3.4*  CL  --  111  CO2  --  22  GLUCOSE  --  91  BUN  --  <5*  CREATININE 0.58 0.41*  CALCIUM  --  8.1*    Studies/Results: Dg Chest 2 View  11/22/2014   CLINICAL DATA:  Shortness of breath.  EXAM: CHEST  2 VIEW  COMPARISON:  None.  FINDINGS: Mediastinum and hilar structures are normal. Lungs are clear of infiltrates. No acute cardiopulmonary disease noted. Multiple bold left rib fractures are noted.  IMPRESSION: No acute cardiopulmonary disease. Multiple left-sided old rib fractures.   Electronically Signed   By: Marcello Moores  Register   On: 11/22/2014 13:05   Ct Angio Chest Pe W/cm &/or Wo Cm  11/19/2014   CLINICAL DATA:  Shortness of Breath  EXAM: CT ANGIOGRAPHY CHEST WITH CONTRAST  TECHNIQUE: Multidetector CT imaging of the chest was performed using the standard protocol during bolus administration of intravenous contrast. Multiplanar CT image reconstructions and MIPs were obtained to evaluate the vascular anatomy.  CONTRAST:  159mL OMNIPAQUE IOHEXOL 350 MG/ML SOLN  COMPARISON:  Chest CT  December 12, 2008 and chest radiograph November 16, 2013  FINDINGS: There is no demonstrable pulmonary embolus. There is no thoracic aortic aneurysm or dissection. There is atherosclerotic change in the aortic arch region.  Segment of the lingula. There is patchy atelectatic change in the lateral segment of the right lower lobe. Minimal atelectatic changes noted in medial left base and inferior lingula. There is mild lower lobe bronchiectatic change bilaterally. Lungs elsewhere are clear. There is no appreciable thoracic adenopathy. The pericardium is not thickened. There is calcification and a peripheral branch of the left anterior descending coronary artery.  Visualized upper abdominal structures appear unremarkable. There are no blastic or lytic bone lesions. There old healed rib fractures on the left. There are no blastic or lytic bone lesions. Thyroid appears normal.  Review of the MIP images confirms the above findings.  IMPRESSION: No demonstrable pulmonary embolus. Stable 7 x 7 mm nodular opacity in the superior segment of the lingula. Stability of this lesion over time is consistent with benign etiology. Areas of patchy atelectasis in the bases, slightly more on the right than on the left. Mild lower lobe bronchiectatic change bilaterally. No frank edema or consolidation. No adenopathy. Mild coronary artery calcification.   Electronically Signed   By: Lowella Grip M.D.   On: 11/19/2014 13:56   Mr Jeri Cos ON Contrast  11/22/2014   CLINICAL DATA:  Syncopal  episode, falling from a standing position on 11/10/2014. Trauma to the right side of the head. Two episodes of near syncope since then.  EXAM: MRI HEAD WITHOUT AND WITH CONTRAST  TECHNIQUE: Multiplanar, multiecho pulse sequences of the brain and surrounding structures were obtained without and with intravenous contrast.  CONTRAST:  10 cc MultiHance  COMPARISON:  CT 11/11/2014.  MRI 11/30/2008.  FINDINGS: Diffusion imaging does not show any acute or  subacute infarction. There are old small vessel ischemic changes within the pons. No cerebellar insult. The cerebral hemispheres show a few old small vessel changes within the thalami and within the cerebral hemispheric deep and subcortical white matter. No large vessel territory insult. Within the left temporal tip, there is a 6-7 mm late subacute intraparenchymal hematoma. Macey Wurtz peripheral hemosiderin deposition. Minimal adjacent edema. No contrast enhancement. This area was normal on the study of 2010. No hydrocephalus. No evidence of neoplastic mass lesion. No hydrocephalus or extra-axial collection. Small amount of peripheral hemosiderin. No vasogenic edema  IMPRESSION: 7 mm late subacute intraparenchymal hematoma at the left temporal tip, consistent with the previous history of head trauma. This area was normal in 2010 and therefore this does not represent a cavernoma.  Mild chronic small-vessel disease elsewhere throughout the brain as outlined above, similar to the study of 2010.   Electronically Signed   By: Nelson Chimes M.D.   On: 11/22/2014 11:47   US Carotid Bilateral  11/22/2014   CLINICAL DATA:  Syncope, dizziness, blurred vision. Previous tobacco abuse.  EXAM: BILATERAL CAROTID DUPLEX ULTRASOUND  TECHNIQUE: Pearline Cables scale imaging, color Doppler and duplex ultrasound was performed of bilateral carotid and vertebral arteries in the neck.  COMPARISON:  None.  REVIEW OF SYSTEMS: Quantification of carotid stenosis is based on velocity parameters that correlate the residual internal carotid diameter with NASCET-based stenosis levels, using the diameter of the distal internal carotid lumen as the denominator for stenosis measurement.  The following velocity measurements were obtained:  PEAK SYSTOLIC/END DIASTOLIC  RIGHT  ICA:                     334/154cm/sec  CCA:                     16/57XU/XYB  SYSTOLIC ICA/CCA RATIO:  4.5  DIASTOLIC ICA/CCA RATIO: 8.1  ECA:                     82/13cm/sec  LEFT  ICA:   92/29cm/sec  CCA:                     338/32NV/BTY  SYSTOLIC ICA/CCA RATIO:  0.8  DIASTOLIC ICA/CCA RATIO: 1.0  ECA:                     63/9cm/sec  FINDINGS: RIGHT CAROTID ARTERY: Partially calcified plaque in the carotid bulb and proximal ICA. Elevated peak systolic velocities recorded just distal to the plaque in the proximal ICA, with focal aliasing on color Doppler interrogation.  RIGHT VERTEBRAL ARTERY: Normal flow direction and waveform. Limited imaging of subclavian artery shows normal waveform and color Doppler signal in the interrogated segment.  LEFT CAROTID ARTERY: Mild carotid bifurcation noncalcified smooth plaque. No high-grade stenosis. Normal waveforms and color Doppler signal.  LEFT VERTEBRAL ARTERY: Normal flow direction and waveform. Limited imaging of subclavian artery shows normal waveform and color Doppler signal in the interrogated segment.  IMPRESSION: 1. Bilateral carotid bifurcation plaque, resulting  in 70-99% diameter stenosis on the right, less than 50% diameter stenosis on the left. Consider vascular surgical or neurointerventional radiology consultation.   Electronically Signed   By: Arne Cleveland M.D.   On: 11/22/2014 13:21   Anti-infectives: Anti-infectives    Start     Dose/Rate Route Frequency Ordered Stop   12/17/14 0700  vancomycin (VANCOCIN) IVPB 750 mg/150 ml premix     750 mg 150 mL/hr over 60 Minutes Intravenous Every 24 hours 12/16/14 1627 12/18/14 0659   12/15/14 1430  vancomycin (VANCOCIN) IVPB 1000 mg/200 mL premix     1,000 mg 200 mL/hr over 60 Minutes Intravenous 60 min pre-op 12/15/14 1430 12/16/14 0750      Assessment/Plan: s/p Procedure(s): ENDARTERECTOMY CAROTID with Dacron patch (Right) stable slightly 1. Postop blood loss anemia. Her hematocrit dropped from 2824. Had less than 50 cc of blood loss at the time of surgery. She has received several fluid boluses. Blood pressure is trending in the 90s to 100 and is on low dose dopamine. Discussed  this with the patient this morning. Will mobilize and explained that this typically resolves and should be able to wean dopamine and discharge later today.   LOS: 1 day   [name] 12/17/2014, 7:00 AM

## 2014-12-17 NOTE — Progress Notes (Signed)
Spoke with Dr. Donnetta Hutching.  Pt remains on 22mcg/kg/min for hypotension.  Given that she did lose some blood intraoperatively and unable to wean the Dopamine, we will transfuse her a unit of PRBC's.  Lasix 20mg  after unit has finished.   Leontine Locket 12/17/2014 8:12 AM

## 2014-12-17 NOTE — Progress Notes (Signed)
Pt's BPs 80s/40s with dopamine gtt at 23mcg/hr. Notified Samantha, [place], will consult with Dr Oneida Alar. No new orders given at this time. Will continue to monitor.

## 2014-12-18 LAB — TYPE AND SCREEN
ABO/RH(D): B POS
ANTIBODY SCREEN: NEGATIVE
Unit division: 0

## 2014-12-18 NOTE — Progress Notes (Signed)
  Progress Note    12/18/2014 7:42 AM 2 Days Post-Op  Subjective:  Feeling much better  Afebrile x 24hrs HR  69'G-29'B NSR 284'X systolic this am 32% RA  Filed Vitals:   12/18/14 0702  BP: 120/72  Pulse: 90  Temp:   Resp: 16     Physical Exam: Neuro:  In tact; tongue is midline Incision:  C/d/i with steri strips in place  CBC    Component Value Date/Time   WBC 4.5 12/17/2014 0419   RBC 2.75* 12/17/2014 0419   HGB 8.2* 12/17/2014 0419   HCT 24.4* 12/17/2014 0419   PLT 161 12/17/2014 0419   MCV 88.7 12/17/2014 0419   MCH 29.8 12/17/2014 0419   MCHC 33.6 12/17/2014 0419   RDW 17.2* 12/17/2014 0419   LYMPHSABS 2.0 11/12/2014 1119   MONOABS 0.6 11/12/2014 1119   EOSABS 0.1 11/12/2014 1119   BASOSABS 0.1 11/12/2014 1119    BMET    Component Value Date/Time   NA 139 12/17/2014 0419   K 3.4* 12/17/2014 0419   CL 111 12/17/2014 0419   CO2 22 12/17/2014 0419   GLUCOSE 91 12/17/2014 0419   BUN <5* 12/17/2014 0419   CREATININE 0.41* 12/17/2014 0419   CREATININE 0.71 11/12/2014 1119   CALCIUM 8.1* 12/17/2014 0419   GFRNONAA >90 12/17/2014 0419   GFRNONAA >89 11/12/2014 1119   GFRAA >90 12/17/2014 0419   GFRAA >89 11/12/2014 1119     Intake/Output Summary (Last 24 hours) at 12/18/14 0742 Last data filed at 12/17/14 2358  Gross per 24 hour  Intake  939.4 ml  Output   2550 ml  Net -1610.6 ml      Assessment/Plan:  This is a 59 y.o. female who is s/p right CEA 2 Days Post-Op  -pt is doing well this am. -pt neuro exam is in tact -pt has ambulated -pt has voided -Pt BP much improved after PRBC's and pt feeling much better -will discharge home this morning and she will see Dr. Donnetta Hutching back in 2 weeks.   Leontine Locket, PA-C [facility] [phone]

## 2014-12-18 NOTE — Discharge Summary (Signed)
Discharge Summary     [name] 20-Jul-1956 59 y.o. female  [id number]  Admission Date: 12/16/2014  Discharge Date: 12/18/14  Physician: Larina Earthly, MD  Admission Diagnosis: Right internal carotid artery stenosis I65.21   HPI:   This is a 59 y.o. female presents today for evaluation of extracranial cerebrovascular occlusive disease. She is a very pleasant 59 year old female who was in her usual state of health up until 11/12/2014. She had a syncopal episode with loss of consciousness and struck her right hand. Witnesses state that she was down for approximately 10 minutes. She was evaluated in the emergency department to include CT of her head and neck showing no acute findings. Subsequent she underwent a carotid duplex revealing high-grade right carotid stenosis. She is seeing [place] for further discussion of this. The patient denies any prior focal neurologic deficits and no similar episodes of blackouts. She does report that since this event she has had some sensation of dizziness. Also reports occasionally having some fluttering in her chest. This is all been transient. She is right-handed. She does have a long history of cigarette smoking but quit smoking in approximate 6 months ago. Very strong family history of carotid disease in her father and her mother.  Hospital Course:  The patient was admitted to the hospital and taken to the operating room on 12/16/2014 and underwent right carotid endarterectomy.  The pt tolerated the procedure well and was transported to the PACU in good condition.   By POD 1, the pt neuro status was in tact.  She did have acute surgical blood loss anemia.  She did require dopamine for blood pressure support.  She was transfused one unit of PRBC's and was weaned off the dopamine successfully.  The remainder of the hospital course consisted of increasing mobilization and increasing intake of solids without difficulty.    Recent Labs  12/17/14 0419    NA 139  K 3.4*  CL 111  CO2 22  GLUCOSE 91  BUN <5*  CALCIUM 8.1*    Recent Labs  12/16/14 1930 12/17/14 0419  WBC 4.0 4.5  HGB 9.6* 8.2*  HCT 28.2* 24.4*  PLT 204 161   No results for input(s): INR in the last 72 hours.  Discharge Instructions:   The patient is discharged with extensive instructions on wound care and progressive ambulation.  They are instructed not to drive or perform any heavy lifting until returning to see the physician in his office.  She is also given an Rx for Tylenol #3.  She is instructed to take that or regular Tylenol but not both at the same time.  She and her family member express understanding.      Discharge Instructions    CAROTID Sugery: Call MD for difficulty swallowing or speaking; weakness in arms or legs that is a new symtom; severe headache.  If you have increased swelling in the neck and/or  are having difficulty breathing, CALL 911    Complete by:  As directed      Call MD for:  redness, tenderness, or signs of infection (pain, swelling, bleeding, redness, odor or green/yellow discharge around incision site)    Complete by:  As directed      Call MD for:  severe or increased pain, loss or decreased feeling  in affected limb(s)    Complete by:  As directed      Call MD for:  temperature >100.5    Complete by:  As directed  Discharge wound care:    Complete by:  As directed   May shower on 12/18/14. Wash wound daily with soap and water. Pat dry. You do not have to apply a dressing.     Driving Restrictions    Complete by:  As directed   No driving for 2 weeks     Increase activity slowly    Complete by:  As directed   Walk with assistance use walker or cane as needed     Lifting restrictions    Complete by:  As directed   No lifting for 2 weeks     Resume previous diet    Complete by:  As directed            Discharge Diagnosis:  Right internal carotid artery stenosis I65.21  Secondary Diagnosis: Patient Active Problem  List   Diagnosis Date Noted  . Carotid stenosis, asymptomatic 12/16/2014  . HTN (hypertension) 10/03/2014  . Abnormal glucose 10/03/2014  . Medication management 10/03/2014  . COPD (chronic obstructive pulmonary disease)   . Asthma   . Vitamin D deficiency   . COLONIC POLYPS 04/30/2008  . Mixed hyperlipidemia 04/30/2008  . HEMORRHOIDS, INTERNAL 04/30/2008  . GERD 04/30/2008  . GASTRIC ULCER 04/30/2008  . DUODENAL ULCER 04/30/2008   Past Medical History  Diagnosis Date  . GERD (gastroesophageal reflux disease)   . Duodenal ulcer   . Internal hemorrhoids   . Hx of colonic polyps   . Arthritis   . Hyperlipemia   . Family history of malignant neoplasm of gastrointestinal tract   . COPD (chronic obstructive pulmonary disease)   . Asthma   . Vitamin D deficiency   . Allergy   . Anemia   . Blood transfusion without reported diagnosis   . Cataract   . Osteoporosis   . HTN (hypertension) 10/03/2014  . Peripheral vascular disease   . Shortness of breath dyspnea     with ambulation  . Pneumonia     hx of  . Hepatitis     had in 6th grade pt unsure which kind      Medication List    TAKE these medications        acetaminophen 500 MG tablet  Commonly known as:  TYLENOL  Take 500 mg by mouth every 6 (six) hours as needed for moderate pain or headache.     acetaminophen-codeine 300-30 MG per tablet  Commonly known as:  TYLENOL #3  Take 1 tablet by mouth every 6 (six) hours as needed for moderate pain. MDD: 4 tablets per day     ADVAIR DISKUS 250-50 MCG/DOSE Aepb  Generic drug:  Fluticasone-Salmeterol  Inhale 1 puff into the lungs every 12 (twelve) hours.     albuterol 108 (90 BASE) MCG/ACT inhaler  Commonly known as:  PROVENTIL HFA;VENTOLIN HFA  Inhale 2 puffs into the lungs every 6 (six) hours as needed for wheezing or shortness of breath.     alendronate 70 MG tablet  Commonly known as:  FOSAMAX  Take 70 mg by mouth every 7 (seven) days. Take with a full glass of  water on an empty stomach.     aspirin EC 81 MG tablet  Take 1 tablet (81 mg total) by mouth daily.     cetirizine 10 MG tablet  Commonly known as:  ZYRTEC  Take 10 mg by mouth as needed for allergies.     citalopram 20 MG tablet  Commonly known as:  CELEXA  TAKE 1 TABLET (  20 MG TOTAL) BY MOUTH DAILY.     ferrous sulfate 325 (65 FE) MG tablet  Take 325 mg by mouth daily with breakfast.     fexofenadine 180 MG tablet  Commonly known as:  ALLEGRA  Take 180 mg by mouth as needed for allergies.     omeprazole 40 MG capsule  Commonly known as:  PRILOSEC  TAKE 1 CAPSULE TWICE A DAY     ondansetron 4 MG tablet  Commonly known as:  ZOFRAN  Take 1 tablet (4 mg total) by mouth daily as needed for nausea or vomiting.     sucralfate 1 G tablet  Commonly known as:  CARAFATE  1 PILL BEFORE MEALS 2-3 TIMES DAILY AS NEEDED FOR PAIN     sulfamethoxazole-trimethoprim 800-160 MG per tablet  Commonly known as:  BACTRIM DS,SEPTRA DS  Take 1 tablet by mouth 2 (two) times daily.     Vitamin D 2000 UNITS Caps  Take 7000 units a day       Prescriptions given: Tylenol #3  #20 No Refill Aspirin 81mg  daily  Disposition: home  Patient's condition: is Good  Follow up: 1. Dr. Arbie Cookey in 2 weeks.   Doreatha Massed, PA-C [facility] [phone]  --- For [facility] use --- Instructions: Press F2 to tab through selections.  Delete question if not applicable.   Modified Rankin score at D/C (0-6): 0  IV medication needed for:  1. Hypertension: No 2. Hypotension: Yes  Post-op Complications: No  1. Post-op CVA or TIA: No  If yes: Event classification (right eye, left eye, right cortical, left cortical, verterobasilar, other): n/a  If yes: Timing of event (intra-op, <6 hrs post-op, >=6 hrs post-op, unknown): n/a  2. CN injury: No  If yes: CN n/a injuried   3. Myocardial infarction: No  If yes: Dx by (EKG or clinical, Troponin): n/a  4.  CHF: No  5.   Dysrhythmia (new): No  6. Wound infection: No  7. Reperfusion symptoms: No  8. Return to OR: No  If yes: return to OR for (bleeding, neurologic, other CEA incision, other): n/a  Discharge medications: Statin use:  No If No: [ x] For Medical reasons-allergy, [ ]  Non-compliant, [ ]  Not-indicated ASA use:  Yes  If No: [ ]  For Medical reasons, [ ]  Non-compliant, [ ]  Not-indicated Beta blocker use:  No If No: [ x] For Medical reasons, [ ]  Non-compliant, [ ]  Not-indicated ACE-Inhibitor use:  No If No: [ ]  For Medical reasons, [ ]  Non-compliant, [ ]  Not-indicated P2Y12 Antagonist use: No, [ ]  Plavix, [ ]  Plasugrel, [ ]  Ticlopinine, [ ]  Ticagrelor, [ ]  Other, [ ]  No for medical reason, [ ]  Non-compliant, [ ]  Not-indicated Anti-coagulant use:  No, [ ]  Warfarin, [ ]  Rivaroxaban, [ ]  Dabigatran, [ ]  Other, [ ]  No for medical reason, [ ]  Non-compliant, [ ]  Not-indicated

## 2014-12-30 ENCOUNTER — Encounter: Payer: Self-pay | Admitting: Vascular Surgery

## 2014-12-31 ENCOUNTER — Encounter: Payer: Self-pay | Admitting: Vascular Surgery

## 2014-12-31 ENCOUNTER — Ambulatory Visit (INDEPENDENT_AMBULATORY_CARE_PROVIDER_SITE_OTHER): Payer: Self-pay | Admitting: Vascular Surgery

## 2014-12-31 VITALS — BP 138/77 | HR 86 | Resp 18 | Ht 62.25 in | Wt 105.8 lb

## 2014-12-31 DIAGNOSIS — I6523 Occlusion and stenosis of bilateral carotid arteries: Secondary | ICD-10-CM

## 2014-12-31 NOTE — Addendum Note (Signed)
Addended by: Mena Goes on: 12/31/2014 11:09 AM   Modules accepted: Orders

## 2014-12-31 NOTE — Progress Notes (Signed)
The patient reports today for follow-up of her recent right carotid endarterectomy on 12/16/2014 for severe asymptomatic right carotid stenosis. She did quite well hospital was discharged home on postoperative day 1. She reports that she is quite well since her surgery and interestingly reports having more energy and feeling better than preop. She specifically has had no focal neurologic deficits and no discomfort in her incision. She does have some peri-incisional numbness.  Physical exam she is intact neurologically. Her right neck incision is healing quite nicely with the typical healing ridge.  Pression and plan stable early follow-up after right carotid endarterectomy for severe asymptomatic disease. She will resume full activity with no limitation. We will see her again in 6 months with repeat carotid duplex follow-up

## 2015-01-02 ENCOUNTER — Encounter: Payer: Self-pay | Admitting: Physician Assistant

## 2015-01-02 ENCOUNTER — Ambulatory Visit (INDEPENDENT_AMBULATORY_CARE_PROVIDER_SITE_OTHER): Payer: BLUE CROSS/BLUE SHIELD | Admitting: Physician Assistant

## 2015-01-02 VITALS — BP 120/78 | HR 80 | Temp 97.7°F | Resp 16 | Ht 62.0 in | Wt 105.0 lb

## 2015-01-02 DIAGNOSIS — I6521 Occlusion and stenosis of right carotid artery: Secondary | ICD-10-CM

## 2015-01-02 DIAGNOSIS — F325 Major depressive disorder, single episode, in full remission: Secondary | ICD-10-CM | POA: Insufficient documentation

## 2015-01-02 DIAGNOSIS — K219 Gastro-esophageal reflux disease without esophagitis: Secondary | ICD-10-CM

## 2015-01-02 DIAGNOSIS — J449 Chronic obstructive pulmonary disease, unspecified: Secondary | ICD-10-CM

## 2015-01-02 DIAGNOSIS — R6889 Other general symptoms and signs: Secondary | ICD-10-CM

## 2015-01-02 DIAGNOSIS — E559 Vitamin D deficiency, unspecified: Secondary | ICD-10-CM

## 2015-01-02 DIAGNOSIS — M810 Age-related osteoporosis without current pathological fracture: Secondary | ICD-10-CM | POA: Insufficient documentation

## 2015-01-02 DIAGNOSIS — J45909 Unspecified asthma, uncomplicated: Secondary | ICD-10-CM

## 2015-01-02 DIAGNOSIS — Z0001 Encounter for general adult medical examination with abnormal findings: Secondary | ICD-10-CM

## 2015-01-02 DIAGNOSIS — F324 Major depressive disorder, single episode, in partial remission: Secondary | ICD-10-CM

## 2015-01-02 DIAGNOSIS — K253 Acute gastric ulcer without hemorrhage or perforation: Secondary | ICD-10-CM

## 2015-01-02 DIAGNOSIS — E782 Mixed hyperlipidemia: Secondary | ICD-10-CM

## 2015-01-02 DIAGNOSIS — K269 Duodenal ulcer, unspecified as acute or chronic, without hemorrhage or perforation: Secondary | ICD-10-CM

## 2015-01-02 DIAGNOSIS — D126 Benign neoplasm of colon, unspecified: Secondary | ICD-10-CM

## 2015-01-02 DIAGNOSIS — Z23 Encounter for immunization: Secondary | ICD-10-CM

## 2015-01-02 DIAGNOSIS — I10 Essential (primary) hypertension: Secondary | ICD-10-CM

## 2015-01-02 DIAGNOSIS — R55 Syncope and collapse: Secondary | ICD-10-CM

## 2015-01-02 DIAGNOSIS — K648 Other hemorrhoids: Secondary | ICD-10-CM

## 2015-01-02 DIAGNOSIS — Z79899 Other long term (current) drug therapy: Secondary | ICD-10-CM

## 2015-01-02 DIAGNOSIS — R7309 Other abnormal glucose: Secondary | ICD-10-CM

## 2015-01-02 LAB — CBC WITH DIFFERENTIAL/PLATELET
BASOS ABS: 0.1 10*3/uL (ref 0.0–0.1)
BASOS PCT: 1 % (ref 0–1)
Eosinophils Absolute: 0.2 10*3/uL (ref 0.0–0.7)
Eosinophils Relative: 4 % (ref 0–5)
HCT: 36.3 % (ref 36.0–46.0)
HEMOGLOBIN: 11.8 g/dL — AB (ref 12.0–15.0)
Lymphocytes Relative: 50 % — ABNORMAL HIGH (ref 12–46)
Lymphs Abs: 2.9 10*3/uL (ref 0.7–4.0)
MCH: 29.6 pg (ref 26.0–34.0)
MCHC: 32.5 g/dL (ref 30.0–36.0)
MCV: 91 fL (ref 78.0–100.0)
MPV: 8.4 fL — ABNORMAL LOW (ref 8.6–12.4)
Monocytes Absolute: 0.5 10*3/uL (ref 0.1–1.0)
Monocytes Relative: 8 % (ref 3–12)
Neutro Abs: 2.1 10*3/uL (ref 1.7–7.7)
Neutrophils Relative %: 37 % — ABNORMAL LOW (ref 43–77)
Platelets: 363 10*3/uL (ref 150–400)
RBC: 3.99 MIL/uL (ref 3.87–5.11)
RDW: 17.4 % — ABNORMAL HIGH (ref 11.5–15.5)
WBC: 5.7 10*3/uL (ref 4.0–10.5)

## 2015-01-02 LAB — LIPID PANEL
CHOL/HDL RATIO: 4.6 ratio
Cholesterol: 201 mg/dL — ABNORMAL HIGH (ref 0–200)
HDL: 44 mg/dL — ABNORMAL LOW (ref >=46)
LDL Cholesterol: 133 mg/dL — ABNORMAL HIGH (ref 0–99)
TRIGLYCERIDES: 118 mg/dL (ref <150)
VLDL: 24 mg/dL (ref 0–40)

## 2015-01-02 LAB — BASIC METABOLIC PANEL WITH GFR
BUN: 9 mg/dL (ref 6–23)
CO2: 23 meq/L (ref 19–32)
Calcium: 8.9 mg/dL (ref 8.4–10.5)
Chloride: 108 mEq/L (ref 96–112)
Creat: 0.62 mg/dL (ref 0.50–1.10)
GFR, Est African American: >89 mL/min
GFR, Est Non African American: >89 mL/min
Glucose, Bld: 82 mg/dL (ref 70–99)
POTASSIUM: 4.7 meq/L (ref 3.5–5.3)
SODIUM: 141 meq/L (ref 135–145)

## 2015-01-02 LAB — MAGNESIUM: Magnesium: 2 mg/dL (ref 1.5–2.5)

## 2015-01-02 LAB — HEPATIC FUNCTION PANEL
ALBUMIN: 3.8 g/dL (ref 3.5–5.2)
ALT: <8 U/L (ref 0–35)
AST: 13 U/L (ref 0–37)
Alkaline Phosphatase: 73 U/L (ref 39–117)
Bilirubin, Direct: <0.1 mg/dL (ref 0.0–0.3)
TOTAL PROTEIN: 6.8 g/dL (ref 6.0–8.3)
Total Bilirubin: 0.1 mg/dL — ABNORMAL LOW (ref 0.2–1.2)

## 2015-01-02 NOTE — Patient Instructions (Addendum)
Need to get mammogram and will try to get DEXA (to check out your bones) at the same time.  Will get EEG of your brain.   Your LDL is not in range, 10/03/2014: LDL (calc) 134*. Your LDL is the bad cholesterol that can lead to heart attack and stroke. To lower your number you can decrease your fatty foods, red meat, cheese, milk and increase fiber like whole grains and veggies. You can also add a fiber supplement like Metamucil or Benefiber.   Benefiber is good for constipation/diarrhea/irritable bowel syndrome, it helps with weight loss and can help lower your bad cholesterol. Please do 1-2 TBSP in the morning in water, coffee, or tea. It can take up to a month before you can see a difference with your bowel movements. It is cheapest from [employer], [employer], [employer].

## 2015-01-02 NOTE — Progress Notes (Signed)
Complete Physical  Assessment and Plan: 1. Essential hypertension - CBC with Differential/Platelet - BASIC METABOLIC PANEL WITH GFR - Hepatic function panel - TSH - Microalbumin / creatinine urine ratio - Urine Microscopic  2. Mixed hyperlipidemia -continue medications, check lipids, decrease fatty foods, increase activity.  - Lipid panel  3. Severe carotid stenosis, asymptomatic, right s/p right carotid endarterectomy and doing very well without  4. Chronic obstructive pulmonary disease, unspecified COPD, unspecified chronic bronchitis type Stopped smoking x 9 months, continue Advair/albuterol inhaler, call if exacerbation  5. Asthma, unspecified asthma severity, uncomplicated Continue advair/albuterol  6. COLONIC POLYPS Up to date  7. Gastroesophageal reflux disease, esophagitis presence not specified Continue PPI/H2 blocker, diet discussed  8. Acute gastric ulcer Stopped smoking, no NSAIDS, continue medications.  9. Duodenal ulcer without hemorrhage or perforation and without obstruction Stopped smoking, no NSAIDS, continue medications.   10. Vitamin D deficiency - Vit D  25 hydroxy (rtn osteoporosis monitoring)  11. Abnormal glucose Discussed general issues about diabetes pathophysiology and management., Educational material distributed., Suggested low cholesterol diet., Encouraged aerobic exercise., Discussed foot care., Reminded to get yearly retinal exam.  12. Medication management - Magnesium  13. Internal hemorrhoids Monitor, add fiber, increase fluids, avoid constipation/straining.   14. Osteoporosis - DG Bone Density; Future  15. Depression, major, in remission monitor  16. Need for prophylactic vaccination with combined diphtheria-tetanus-pertussis (DTP) vaccine - Tdap vaccine greater than or equal to 7yo IM  17. Health Maintenance Get MGM, over due  18. Syncopal episode ? If vasovagal, so far labs/tests have been negative, she has had  hypotension- story is still highly suspicious for possible seizure with LOC x 10 mins, loss of bladder control, and confusion/combative afterwards with possible post-ictal period- will get EEG.   Discussed med's effects and SE's. Screening labs and tests as requested with regular follow-up as recommended.  HPI  59 y.o. female  presents for a complete physical.  Her blood pressure has been controlled at home, today their BP is BP: 120/78 mmHg She does not workout. She denies chest pain, shortness of breath, dizziness.  She has a 18 pack year smoking history and had a syncopal episode on 11/12/2014, currently still pending EEG, holter monitor, cardio eval, however she did have severe right carotid stenosis, and is 17 days s/p right carotid endarterectomy with Dr. Donnetta Hutching. She is on bASA.   She has COPD and is on Advair/albuterol.  Quit smoking 8 months ago. She is on fosamax x 2 years for osteoporosis and has history of cervical compression fractures.  She is not on cholesterol medication due to intolerance of statins.  Her cholesterol is not at goal of less than 70. The cholesterol last visit was:   Lab Results  Component Value Date   CHOL 218* 10/03/2014   HDL 57 10/03/2014   LDLCALC 134* 10/03/2014   TRIG 133 10/03/2014   CHOLHDL 3.8 10/03/2014  Last A1C in the office was:  Lab Results  Component Value Date   HGBA1C 5.4 10/03/2014  Patient is on Vitamin D supplement.   Lab Results  Component Value Date   VD25OH 33 10/03/2014  Last EGD 2015 with Dr. Henrene Pastor that showed gastric ulcers, was on PPI BID.    She is off the celexa.   Current Medications:  Current Outpatient Prescriptions on File Prior to Visit  Medication Sig Dispense Refill  . acetaminophen (TYLENOL) 500 MG tablet Take 500 mg by mouth every 6 (six) hours as needed for moderate pain or  headache.     Marland Kitchen acetaminophen-codeine (TYLENOL #3) 300-30 MG per tablet Take 1 tablet by mouth every 6 (six) hours as needed for moderate  pain. MDD: 4 tablets per day 20 tablet 0  . albuterol (PROVENTIL HFA;VENTOLIN HFA) 108 (90 BASE) MCG/ACT inhaler Inhale 2 puffs into the lungs every 6 (six) hours as needed for wheezing or shortness of breath. 1 Inhaler 2  . alendronate (FOSAMAX) 70 MG tablet Take 70 mg by mouth every 7 (seven) days. Take with a full glass of water on an empty stomach.    Marland Kitchen aspirin EC 81 MG tablet Take 1 tablet (81 mg total) by mouth daily. 30 tablet 0  . cetirizine (ZYRTEC) 10 MG tablet Take 10 mg by mouth as needed for allergies.    . Cholecalciferol (VITAMIN D) 2000 UNITS CAPS Take 7000 units a day     . citalopram (CELEXA) 20 MG tablet TAKE 1 TABLET (20 MG TOTAL) BY MOUTH DAILY. 90 tablet 0  . ferrous sulfate 325 (65 FE) MG tablet Take 325 mg by mouth daily with breakfast.      . fexofenadine (ALLEGRA) 180 MG tablet Take 180 mg by mouth as needed for allergies.     . Fluticasone-Salmeterol (ADVAIR DISKUS) 250-50 MCG/DOSE AEPB Inhale 1 puff into the lungs every 12 (twelve) hours.    Marland Kitchen omeprazole (PRILOSEC) 40 MG capsule TAKE 1 CAPSULE TWICE A DAY 60 capsule 10  . sucralfate (CARAFATE) 1 G tablet 1 PILL BEFORE MEALS 2-3 TIMES DAILY AS NEEDED FOR PAIN 60 tablet 0   No current facility-administered medications on file prior to visit.   Health Maintenance:   Immunization History  Administered Date(s) Administered  . Pneumococcal Polysaccharide-23 12/18/2014  . Pneumococcal-Unspecified 05/18/2001  . Td 12/16/2004   Tetanus: 2006 DUE Pneumovax: 2016 Prevnar 13: due when 65 Flu vaccine: declines Zostavax: N/A LMP: s/p hysterectomy Pap: Dr. Harrington Challenger MGM: 12/2012  OVER DUE DEXA: 12/2012 + osteoporisis DUE Colonoscopy: 10/2011 EGD: 09/2014 CT chest 11/2014 CXR 11/2014 MRI brain- 11/2014 Myocardial perfusion- 2009 Echo: 2010 Carotid US 11/2014 AB [place] 08/2014  Last Dental Exam: None, [facility] Last Eye Exam: Dr. Augusto Gamble  Patient Care Team: Unk Pinto, MD as PCP - General (Internal  Medicine) Irene Shipper, MD as Consulting Physician (Gastroenterology) Harle Battiest, MD as Consulting Physician (Obstetrics and Gynecology)  Dr. Donnetta Hutching  Allergies:  Allergies  Allergen Reactions  . Celebrex [Celecoxib]     GI bleed  . Crestor [Rosuvastatin]     Made legs hurt  . Penicillins     REACTION: whelps  . Pravastatin     Muscle aches   Medical History:  Past Medical History  Diagnosis Date  . GERD (gastroesophageal reflux disease)   . Duodenal ulcer   . Internal hemorrhoids   . Hx of colonic polyps   . Arthritis   . Hyperlipemia   . Family history of malignant neoplasm of gastrointestinal tract   . COPD (chronic obstructive pulmonary disease)   . Asthma   . Vitamin D deficiency   . Allergy   . Anemia   . Blood transfusion without reported diagnosis   . Cataract   . Osteoporosis   . HTN (hypertension) 10/03/2014  . Peripheral vascular disease   . Shortness of breath dyspnea     with ambulation  . Pneumonia     hx of  . Hepatitis     had in 6th grade pt unsure which kind   Surgical History:  Past  Surgical History  Procedure Laterality Date  . Abdominal hysterectomy    . Appendectomy    . Total hip arthroplasty      right  . Lumbar disc surgery    . Elbow surgery      rt  . Wrist surgery      left  . Hand surgery    . Esophagogastroduodenoscopy  10/2008  . Colonoscopy    . Upper gastrointestinal endoscopy    . Trigeminal neuralgia surgery    . Joint replacement Right 2007    hip  . Endarterectomy Right 12/16/2014    Procedure: ENDARTERECTOMY CAROTID with Dacron patch;  Surgeon: Rosetta Posner, MD;  [facility];  Service: Vascular;  Laterality: Right;   Family History:  Family History  Problem Relation Age of Onset  . Colon polyps Mother   . Inflammatory bowel disease Mother   . Hypertension Mother   . Stroke Mother   . Heart disease Mother   . Hyperlipidemia Mother   . Varicose Veins Mother   . Heart attack Mother   . AAA (abdominal  aortic aneurysm) Mother   . Diabetes Maternal Grandfather   . Heart disease Maternal Grandfather   . Heart disease Maternal Grandmother   . Colon cancer      mat great aunt  . Hypertension Father   . Hyperlipidemia Father   . Heart disease Father   . Heart attack Father   . AAA (abdominal aortic aneurysm) Father   . Heart disease Brother   . Hyperlipidemia Brother   . Hypertension Brother   . Heart attack Brother   . AAA (abdominal aortic aneurysm) Brother   . Esophageal cancer Neg Hx   . Stomach cancer Neg Hx   . Rectal cancer Neg Hx   . Varicose Veins Daughter    Social History:  History  Substance Use Topics  . Smoking status: Current Every Day Smoker -- 1.00 packs/day for 36 years    Types: Cigarettes    Last Attempt to Quit: 06/18/2014  . Smokeless tobacco: Never Used     Comment: was taking chantix but it gave her a headache  . Alcohol Use: 0.0 oz/week    0 Standard drinks or equivalent per week     Comment: occasional    Review of Systems: Review of Systems  Constitutional: Negative.   HENT: Negative.   Eyes: Negative.   Respiratory: Positive for cough. Negative for hemoptysis, sputum production, shortness of breath and wheezing.   Cardiovascular: Negative.   Gastrointestinal: Negative.   Genitourinary: Negative.   Musculoskeletal: Positive for joint pain. Negative for myalgias, back pain, falls and neck pain.  Skin: Negative.   Neurological: Positive for dizziness (only with looking up). Negative for tingling, tremors, sensory change, speech change, focal weakness, seizures and loss of consciousness.  Psychiatric/Behavioral: Negative.     Physical Exam: Estimated body mass index is 19.2 kg/(m^2) as calculated from the following:   Height as of this encounter: 5\' 2"  (1.575 m).   Weight as of this encounter: 105 lb (47.628 kg). BP 120/78 mmHg  Pulse 80  Temp(Src) 97.7 F (36.5 C)  Resp 16  Ht 5\' 2"  (1.575 m)  Wt 105 lb (47.628 kg)  BMI 19.20  kg/m2 General Appearance: Well nourished, in no apparent distress.  Eyes: PERRLA, EOMs, conjunctiva no swelling or erythema, normal fundi and vessels.  Sinuses: No Frontal/maxillary tenderness  ENT/Mouth: Ext aud canals clear, normal light reflex with TMs without erythema, bulging. Good dentition.  No erythema, swelling, or exudate on post pharynx. Tonsils not swollen or erythematous. Hearing normal.  Neck: Supple, thyroid normal. No bruits  Respiratory: Respiratory effort normal, diffuse decreased breath sounds without rales, rhonchi, wheezing or stridor.  Cardio: RRR without murmurs, rubs or gallops. Brisk peripheral pulses without edema.  Chest: symmetric, with normal excursions and percussion.  Breasts: declines.  Abdomen: Soft, nontender, no guarding, rebound, hernias, masses, or organomegaly. .  Lymphatics: Non tender without lymphadenopathy.  Genitourinary: declines Musculoskeletal: Full ROM all peripheral extremities,5/5 strength, and normal gait.  Skin: Well healing carotid enterectomy scar. Warm, dry without rashes, lesions, ecchymosis. Neuro: Cranial nerves intact, reflexes equal bilaterally. Normal muscle tone, no cerebellar symptoms. Sensation intact.  Psych: Awake and oriented X 3, normal affect, Insight and Judgment appropriate.   EKG: has had recently for preop AORTA SCAN: declines  Vicie Mutters 2:06 PM [facility]

## 2015-01-03 LAB — URINALYSIS, MICROSCOPIC ONLY
BACTERIA UA: NONE SEEN
CASTS: NONE SEEN
Crystals: NONE SEEN
SQUAMOUS EPITHELIAL / LPF: NONE SEEN

## 2015-01-03 LAB — TSH: TSH: 1.564 u[IU]/mL (ref 0.350–4.500)

## 2015-01-03 LAB — MICROALBUMIN / CREATININE URINE RATIO
Creatinine, Urine: 51.7 mg/dL
Microalb Creat Ratio: 40.6 mg/g — ABNORMAL HIGH (ref 0.0–30.0)
Microalb, Ur: 2.1 mg/dL — ABNORMAL HIGH (ref <2.0)

## 2015-01-03 LAB — VITAMIN D 25 HYDROXY (VIT D DEFICIENCY, FRACTURES): Vit D, 25-Hydroxy: 31 ng/mL (ref 30–100)

## 2015-01-07 ENCOUNTER — Ambulatory Visit (INDEPENDENT_AMBULATORY_CARE_PROVIDER_SITE_OTHER): Payer: BLUE CROSS/BLUE SHIELD | Admitting: Neurology

## 2015-01-07 DIAGNOSIS — R55 Syncope and collapse: Secondary | ICD-10-CM

## 2015-01-08 ENCOUNTER — Other Ambulatory Visit: Payer: Self-pay

## 2015-01-08 MED ORDER — EZETIMIBE 10 MG PO TABS: 10.0000 mg | ORAL_TABLET | Freq: Every day | ORAL | Status: DC

## 2015-01-09 ENCOUNTER — Other Ambulatory Visit: Payer: Self-pay | Admitting: Emergency Medicine

## 2015-01-13 NOTE — Procedures (Signed)
ELECTROENCEPHALOGRAM REPORT  Date of Study: [birth year]  Patient's Name: [name] MRN: [id number] Date of Birth: 01/07/2013  Referring Provider: Vicie Mutters, PA-C  Clinical History: This is a 59 year old woman with syncope for 10 minutes with urinary incontinence and post-ictal period of confusion and combativeness  Medications: albuterol, Fosamax, ASA, Zyrtec, vitamin D, Celexa, 65Fe, Allegra, Advair diskus, Prilosec, Carafate  Technical Summary: A multichannel digital EEG recording measured by the international 10-20 system with electrodes applied with paste and impedances below 5000 ohms performed in our laboratory with EKG monitoring in an awake and asleep patient.  Hyperventilation was not performed. Photic stimulation was performed.  The digital EEG was referentially recorded, reformatted, and digitally filtered in a variety of bipolar and referential montages for optimal display.    Description: The patient is awake and asleep during the recording.  During maximal wakefulness, there is a symmetric, medium voltage 9 Hz posterior dominant rhythm that attenuates with eye opening, at times sharply contoured over the posterior temporal regions without clear epileptogenic potential.  The record is symmetric.  During drowsiness and sleep, there is an increase in theta slowing of the background.  Vertex waves and symmetric sleep spindles were seen.  Photic stimulation did not elicit any abnormalities.  There were no epileptiform discharges or electrographic seizures seen.    EKG lead was unremarkable.  Impression: This awake and asleep EEG is within normal limits.  Clinical Correlation: A normal EEG does not exclude a clinical diagnosis of epilepsy. Clinical correlation is advised.   Ellouise Newer, M.D.

## 2015-02-06 ENCOUNTER — Other Ambulatory Visit: Payer: BLUE CROSS/BLUE SHIELD

## 2015-02-06 DIAGNOSIS — R350 Frequency of micturition: Secondary | ICD-10-CM

## 2015-02-06 DIAGNOSIS — R8290 Unspecified abnormal findings in urine: Secondary | ICD-10-CM

## 2015-02-06 LAB — URINALYSIS, ROUTINE W REFLEX MICROSCOPIC
Bilirubin Urine: NEGATIVE
Glucose, UA: NEGATIVE mg/dL
HGB URINE DIPSTICK: NEGATIVE
Ketones, ur: NEGATIVE mg/dL
LEUKOCYTES UA: NEGATIVE
Nitrite: NEGATIVE
PH: 5 (ref 5.0–8.0)
Protein, ur: NEGATIVE mg/dL
Specific Gravity, Urine: 1.007 (ref 1.005–1.030)
Urobilinogen, UA: 0.2 mg/dL (ref 0.0–1.0)

## 2015-02-07 LAB — MICROALBUMIN / CREATININE URINE RATIO
CREATININE, URINE: 27.2 mg/dL
MICROALB UR: 4.3 mg/dL — AB (ref <2.0)
MICROALB/CREAT RATIO: 158.1 mg/g — AB (ref 0.0–30.0)

## 2015-02-07 LAB — URINE CULTURE: Colony Count: 3000

## 2015-03-09 ENCOUNTER — Emergency Department (HOSPITAL_COMMUNITY)
Admission: EM | Admit: 2015-03-09 | Discharge: 2015-03-09 | Disposition: A | Discharge status: Home / Self Care | Payer: BLUE CROSS/BLUE SHIELD | Attending: Emergency Medicine | Admitting: Emergency Medicine

## 2015-03-09 ENCOUNTER — Emergency Department (HOSPITAL_COMMUNITY): Payer: BLUE CROSS/BLUE SHIELD

## 2015-03-09 ENCOUNTER — Encounter (HOSPITAL_COMMUNITY): Payer: Self-pay | Admitting: Emergency Medicine

## 2015-03-09 DIAGNOSIS — Z8701 Personal history of pneumonia (recurrent): Secondary | ICD-10-CM | POA: Insufficient documentation

## 2015-03-09 DIAGNOSIS — Y9289 Other specified places as the place of occurrence of the external cause: Secondary | ICD-10-CM | POA: Diagnosis not present

## 2015-03-09 DIAGNOSIS — S5292XA Unspecified fracture of left forearm, initial encounter for closed fracture: Secondary | ICD-10-CM

## 2015-03-09 DIAGNOSIS — Z79899 Other long term (current) drug therapy: Secondary | ICD-10-CM | POA: Insufficient documentation

## 2015-03-09 DIAGNOSIS — Y998 Other external cause status: Secondary | ICD-10-CM | POA: Diagnosis not present

## 2015-03-09 DIAGNOSIS — I10 Essential (primary) hypertension: Secondary | ICD-10-CM | POA: Insufficient documentation

## 2015-03-09 DIAGNOSIS — Z8669 Personal history of other diseases of the nervous system and sense organs: Secondary | ICD-10-CM | POA: Diagnosis not present

## 2015-03-09 DIAGNOSIS — Z880 Allergy status to penicillin: Secondary | ICD-10-CM | POA: Diagnosis not present

## 2015-03-09 DIAGNOSIS — K219 Gastro-esophageal reflux disease without esophagitis: Secondary | ICD-10-CM | POA: Diagnosis not present

## 2015-03-09 DIAGNOSIS — Z86010 Personal history of colonic polyps: Secondary | ICD-10-CM | POA: Insufficient documentation

## 2015-03-09 DIAGNOSIS — Z7982 Long term (current) use of aspirin: Secondary | ICD-10-CM | POA: Diagnosis not present

## 2015-03-09 DIAGNOSIS — J449 Chronic obstructive pulmonary disease, unspecified: Secondary | ICD-10-CM | POA: Insufficient documentation

## 2015-03-09 DIAGNOSIS — W010XXA Fall on same level from slipping, tripping and stumbling without subsequent striking against object, initial encounter: Secondary | ICD-10-CM | POA: Insufficient documentation

## 2015-03-09 DIAGNOSIS — Y9389 Activity, other specified: Secondary | ICD-10-CM | POA: Insufficient documentation

## 2015-03-09 DIAGNOSIS — M810 Age-related osteoporosis without current pathological fracture: Secondary | ICD-10-CM | POA: Diagnosis not present

## 2015-03-09 DIAGNOSIS — Z8639 Personal history of other endocrine, nutritional and metabolic disease: Secondary | ICD-10-CM | POA: Diagnosis not present

## 2015-03-09 DIAGNOSIS — M1990 Unspecified osteoarthritis, unspecified site: Secondary | ICD-10-CM | POA: Diagnosis not present

## 2015-03-09 DIAGNOSIS — S6992XA Unspecified injury of left wrist, hand and finger(s), initial encounter: Secondary | ICD-10-CM | POA: Diagnosis present

## 2015-03-09 DIAGNOSIS — Z87891 Personal history of nicotine dependence: Secondary | ICD-10-CM | POA: Diagnosis not present

## 2015-03-09 DIAGNOSIS — S52592A Other fractures of lower end of left radius, initial encounter for closed fracture: Secondary | ICD-10-CM | POA: Insufficient documentation

## 2015-03-09 DIAGNOSIS — D649 Anemia, unspecified: Secondary | ICD-10-CM | POA: Insufficient documentation

## 2015-03-09 MED ORDER — OXYCODONE-ACETAMINOPHEN 5-325 MG PO TABS
2.0000 | ORAL_TABLET | Freq: Once | ORAL | Status: AC
  Administered 2015-03-09: 2 via ORAL
  Filled 2015-03-09: qty 2

## 2015-03-09 MED ORDER — OXYCODONE-ACETAMINOPHEN 5-325 MG PO TABS: 2.0000 | ORAL_TABLET | Freq: Four times a day (QID) | ORAL | Status: DC | PRN

## 2015-03-09 NOTE — Discharge Instructions (Signed)
Radial Fracture You have a broken bone (fracture) of the forearm. This is the part of your arm between the elbow and your wrist. Your forearm is made up of two bones. These are the radius and ulna. Your fracture is in the radial shaft. This is the bone in your forearm located on the thumb side. A cast or splint is used to protect and keep your injured bone from moving. The cast or splint will be on generally for about 5 to 6 weeks, with individual variations. HOME CARE INSTRUCTIONS   Keep the injured part elevated while sitting or lying down. Keep the injury above the level of your heart (the center of the chest). This will decrease swelling and pain.  Apply ice to the injury for 15-20 minutes, 03-04 times per day while awake, for 2 days. Put the ice in a plastic bag and place a towel between the bag of ice and your cast or splint.  Move your fingers to avoid stiffness and minimize swelling.  If you have a plaster or fiberglass cast:  Do not try to scratch the skin under the cast using sharp or pointed objects.  Check the skin around the cast every day. You may put lotion on any red or sore areas.  Keep your cast dry and clean.  If you have a plaster splint:  Wear the splint as directed.  You may loosen the elastic around the splint if your fingers become numb, tingle, or turn cold or blue.  Do not put pressure on any part of your cast or splint. It may break. Rest your cast only on a pillow for the first 24 hours until it is fully hardened.  Your cast or splint can be protected during bathing with a plastic bag. Do not lower the cast or splint into water.  Only take over-the-counter or prescription medicines for pain, discomfort, or fever as directed by your caregiver. SEEK IMMEDIATE MEDICAL CARE IF:   Your cast gets damaged or breaks.  You have more severe pain or swelling than you did before getting the cast.  You have severe pain when stretching your fingers.  There is a bad  smell, new stains and/or pus-like (purulent) drainage coming from under the cast.  Your fingers or hand turn pale or blue and become cold or your loose feeling. Document Released: 03/17/2006 Document Revised: 12/27/2011 Document Reviewed: 06/13/2006 [facility] Patient Information 2015 [place], [place]. This information is not intended to replace advice given to you by your health care provider. Make sure you discuss any questions you have with your health care provider.

## 2015-03-09 NOTE — ED Notes (Signed)
Called ortho to bedside  

## 2015-03-09 NOTE — ED Notes (Signed)
Per pt, tripped on curb and caught self with left hand-swollen, pain

## 2015-03-09 NOTE — ED Provider Notes (Signed)
CSN: [id number]     Arrival date & time 03/09/15  1823 History   First MD Initiated Contact with Patient 03/09/15 1841     Chief Complaint  Patient presents with  . Hand Injury     (Consider location/radiation/quality/duration/timing/severity/associated sxs/prior Treatment) HPI Comments: Patient presents to the emergency department with chief complaint of left wrist and hand pain. Patient reports tripping over a concrete barrier yesterday and falling backward on her hand. She states that she has tried taking Goody powder with no relief. She states the pain is moderate to severe. It is worsened with movement and palpation. She denies any weakness, numbness, or tingling of the extremity. The pain radiates from her wrist to her elbow.  The history is provided by the patient. No language interpreter was used.    Past Medical History  Diagnosis Date  . GERD (gastroesophageal reflux disease)   . Duodenal ulcer   . Internal hemorrhoids   . Hx of colonic polyps   . Arthritis   . Hyperlipemia   . Family history of malignant neoplasm of gastrointestinal tract   . COPD (chronic obstructive pulmonary disease)   . Asthma   . Vitamin D deficiency   . Allergy   . Anemia   . Blood transfusion without reported diagnosis   . Cataract   . Osteoporosis   . HTN (hypertension) 10/03/2014  . Peripheral vascular disease   . Shortness of breath dyspnea     with ambulation  . Pneumonia     hx of  . Hepatitis     had in 6th grade pt unsure which kind   Past Surgical History  Procedure Laterality Date  . Abdominal hysterectomy    . Appendectomy    . Total hip arthroplasty      right  . Lumbar disc surgery    . Elbow surgery      rt  . Wrist surgery      left  . Hand surgery    . Esophagogastroduodenoscopy  10/2008  . Colonoscopy    . Upper gastrointestinal endoscopy    . Trigeminal neuralgia surgery    . Joint replacement Right 2007    hip  . Endarterectomy Right 12/16/2014   Procedure: ENDARTERECTOMY CAROTID with Dacron patch;  Surgeon: Rosetta Posner, MD;  Location: [facility] OR;  Service: Vascular;  Laterality: Right;   Family History  Problem Relation Age of Onset  . Colon polyps Mother   . Inflammatory bowel disease Mother   . Hypertension Mother   . Stroke Mother   . Heart disease Mother   . Hyperlipidemia Mother   . Varicose Veins Mother   . Heart attack Mother   . AAA (abdominal aortic aneurysm) Mother   . Diabetes Maternal Grandfather   . Heart disease Maternal Grandfather   . Heart disease Maternal Grandmother   . Colon cancer      mat great aunt  . Hypertension Father   . Hyperlipidemia Father   . Heart disease Father   . Heart attack Father   . AAA (abdominal aortic aneurysm) Father   . Heart disease Brother   . Hyperlipidemia Brother   . Hypertension Brother   . Heart attack Brother   . AAA (abdominal aortic aneurysm) Brother   . Esophageal cancer Neg Hx   . Stomach cancer Neg Hx   . Rectal cancer Neg Hx   . Varicose Veins Daughter    History  Substance Use Topics  . Smoking status: Former Smoker --  1.00 packs/day for 36 years    Types: Cigarettes    Quit date: 06/18/2014  . Smokeless tobacco: Never Used     Comment: was taking chantix but it gave her a headache  . Alcohol Use: 0.0 oz/week    0 Standard drinks or equivalent per week     Comment: occasional   OB History    No data available     Review of Systems  Constitutional: Negative for fever and chills.  Respiratory: Negative for shortness of breath.   Cardiovascular: Negative for chest pain.  Gastrointestinal: Negative for nausea, vomiting, diarrhea and constipation.  Genitourinary: Negative for dysuria.  Musculoskeletal: Positive for joint swelling and arthralgias.      Allergies  Celebrex; Crestor; Penicillins; and Pravastatin  Home Medications   Prior to Admission medications   Medication Sig Start Date End Date Taking? Authorizing Provider  acetaminophen  (TYLENOL) 500 MG tablet Take 500 mg by mouth every 6 (six) hours as needed for moderate pain or headache.     Historical Provider, MD  acetaminophen-codeine (TYLENOL #3) 300-30 MG per tablet Take 1 tablet by mouth every 6 (six) hours as needed for moderate pain. MDD: 4 tablets per day 12/16/14   Alvia Grove, PA-C  albuterol (PROVENTIL HFA;VENTOLIN HFA) 108 (90 BASE) MCG/ACT inhaler Inhale 2 puffs into the lungs every 6 (six) hours as needed for wheezing or shortness of breath. 11/16/13   Melissa Smith, PA-C  alendronate (FOSAMAX) 70 MG tablet Take 70 mg by mouth every 7 (seven) days. Take with a full glass of water on an empty stomach.    Historical Provider, MD  aspirin EC 81 MG tablet Take 1 tablet (81 mg total) by mouth daily. 12/16/14   Alvia Grove, PA-C  cetirizine (ZYRTEC) 10 MG tablet Take 10 mg by mouth as needed for allergies.    Historical Provider, MD  Cholecalciferol (VITAMIN D) 2000 UNITS CAPS Take 7000 units a day     Historical Provider, MD  citalopram (CELEXA) 20 MG tablet TAKE 1 TABLET (20 MG TOTAL) BY MOUTH DAILY. 10/23/14   Unk Pinto, MD  ezetimibe (ZETIA) 10 MG tablet Take 1 tablet (10 mg total) by mouth daily. 01/08/15   Vicie Mutters, PA-C  ferrous sulfate 325 (65 FE) MG tablet Take 325 mg by mouth daily with breakfast.      Historical Provider, MD  fexofenadine (ALLEGRA) 180 MG tablet Take 180 mg by mouth as needed for allergies.     Historical Provider, MD  Fluticasone-Salmeterol (ADVAIR DISKUS) 250-50 MCG/DOSE AEPB Inhale 1 puff into the lungs every 12 (twelve) hours.    Historical Provider, MD  omeprazole (PRILOSEC) 40 MG capsule TAKE 1 CAPSULE TWICE A DAY 01/09/15   Unk Pinto, MD  sucralfate (CARAFATE) 1 G tablet 1 PILL BEFORE MEALS 2-3 TIMES DAILY AS NEEDED FOR PAIN 09/11/14   Vicie Mutters, PA-C   BP 156/68 mmHg  Pulse 94  Temp(Src) 98.1 F (36.7 C) (Oral)  Resp 18  SpO2 98% Physical Exam  Constitutional: She is oriented to person, place, and time.  She appears well-developed and well-nourished.  HENT:  Head: Normocephalic and atraumatic.  Eyes: Conjunctivae and EOM are normal.  Neck: Normal range of motion.  Cardiovascular: Normal rate and intact distal pulses.   Intact distal pulses with brisk capillary refill  Pulmonary/Chest: Effort normal.  Abdominal: She exhibits no distension.  Musculoskeletal: Normal range of motion.  Left wrist mildly swollen, moderately tender to palpation, range of motion limited secondary  to pain, left finger flexion and extension is intact  Neurological: She is alert and oriented to person, place, and time.  Normal sensation of left hand and wrist, strength is limited secondary to pain  Skin: Skin is dry.  Psychiatric: She has a normal mood and affect. Her behavior is normal. Judgment and thought content normal.  Nursing note and vitals reviewed.   ED Course  Procedures (including critical care time) Labs Review Labs Reviewed - No data to display  Imaging Review Dg Wrist Complete Left  03/09/2015   CLINICAL DATA:  Fall, left wrist pain  EXAM: LEFT WRIST - COMPLETE 3+ VIEW  COMPARISON:  None.  FINDINGS: Impacted transverse distal radial fracture without intra-articular extension.  Overlying dorsal soft tissue swelling.  IMPRESSION: Impacted distal radial fracture.   Electronically Signed   By: Julian Hy M.D.   On: 03/09/2015 19:05   Dg Hand Complete Left  03/09/2015   CLINICAL DATA:  Fall, left wrist pain  EXAM: LEFT HAND - COMPLETE 3+ VIEW  COMPARISON:  None.  FINDINGS: Impacted distal radial fracture.  No evidence of acute fracture or dislocation involving the and.  Old traumatic injury developing the proximal 1st and 4th metacarpals.  Mild dorsal soft tissue swelling.  IMPRESSION: Impacted distal radial fracture.   Electronically Signed   By: Julian Hy M.D.   On: 03/09/2015 19:04     EKG Interpretation None     SPLINT APPLICATION Date/Time: 7:16 PM Authorized by: Montine Circle Consent: Verbal consent obtained. Risks and benefits: risks, benefits and alternatives were discussed Consent given by: patient Splint applied by: orthopedic technician Location details: Left wrist  Splint type: Sugar tong  Supplies used: Fiberglas and Ace and sling  Post-procedure: The splinted body part was neurovascularly unchanged following the procedure. Patient tolerance: Patient tolerated the procedure well with no immediate complications.    MDM   Final diagnoses:  Radius fracture, left, closed, initial encounter    Patient with mechanical fall last night. Will check plain films of left hand and wrist. We'll treat pain, give ice, and will reassess.  Plain films remarkable for right radius fracture. Place patient in a sugar tong splint, and will discharge with hand follow-up.      Montine Circle, PA-C 03/09/15 2013  Quintella Reichert, MD 03/09/15 2250

## 2015-04-15 ENCOUNTER — Other Ambulatory Visit: Payer: Self-pay

## 2015-04-23 ENCOUNTER — Other Ambulatory Visit: Payer: BLUE CROSS/BLUE SHIELD

## 2015-04-23 DIAGNOSIS — N390 Urinary tract infection, site not specified: Secondary | ICD-10-CM

## 2015-04-23 DIAGNOSIS — R899 Unspecified abnormal finding in specimens from other organs, systems and tissues: Secondary | ICD-10-CM

## 2015-04-23 DIAGNOSIS — I10 Essential (primary) hypertension: Secondary | ICD-10-CM

## 2015-04-23 LAB — BASIC METABOLIC PANEL WITH GFR
BUN: 15 mg/dL (ref 6–23)
CALCIUM: 9.4 mg/dL (ref 8.4–10.5)
CO2: 21 mEq/L (ref 19–32)
CREATININE: 0.54 mg/dL (ref 0.50–1.10)
Chloride: 108 mEq/L (ref 96–112)
GFR, Est Non African American: >89 mL/min
GLUCOSE: 75 mg/dL (ref 70–99)
Potassium: 5.2 mEq/L (ref 3.5–5.3)
SODIUM: 141 meq/L (ref 135–145)

## 2015-04-24 LAB — URINALYSIS, ROUTINE W REFLEX MICROSCOPIC
Bilirubin Urine: NEGATIVE
Glucose, UA: NEGATIVE mg/dL
Hgb urine dipstick: NEGATIVE
Ketones, ur: NEGATIVE mg/dL
Leukocytes, UA: NEGATIVE
NITRITE: NEGATIVE
Protein, ur: NEGATIVE mg/dL
SPECIFIC GRAVITY, URINE: 1.008 (ref 1.005–1.030)
Urobilinogen, UA: 0.2 mg/dL (ref 0.0–1.0)
pH: 5 (ref 5.0–8.0)

## 2015-04-24 LAB — MICROALBUMIN / CREATININE URINE RATIO
CREATININE, URINE: 13.8 mg/dL
MICROALB/CREAT RATIO: 36.2 mg/g — AB (ref 0.0–30.0)
Microalb, Ur: 0.5 mg/dL (ref <2.0)

## 2015-04-24 LAB — URINE CULTURE
Colony Count: NO GROWTH
ORGANISM ID, BACTERIA: NO GROWTH

## 2015-05-06 ENCOUNTER — Other Ambulatory Visit: Payer: Self-pay | Admitting: Internal Medicine

## 2015-07-09 ENCOUNTER — Encounter: Payer: Self-pay | Admitting: Physician Assistant

## 2015-07-09 ENCOUNTER — Ambulatory Visit (INDEPENDENT_AMBULATORY_CARE_PROVIDER_SITE_OTHER): Payer: BLUE CROSS/BLUE SHIELD | Admitting: Physician Assistant

## 2015-07-09 VITALS — BP 130/72 | HR 84 | Temp 97.7°F | Resp 16 | Ht 62.0 in | Wt 103.0 lb

## 2015-07-09 DIAGNOSIS — J449 Chronic obstructive pulmonary disease, unspecified: Secondary | ICD-10-CM

## 2015-07-09 DIAGNOSIS — R7309 Other abnormal glucose: Secondary | ICD-10-CM

## 2015-07-09 DIAGNOSIS — I10 Essential (primary) hypertension: Secondary | ICD-10-CM | POA: Diagnosis not present

## 2015-07-09 DIAGNOSIS — M810 Age-related osteoporosis without current pathological fracture: Secondary | ICD-10-CM | POA: Diagnosis not present

## 2015-07-09 DIAGNOSIS — Z79899 Other long term (current) drug therapy: Secondary | ICD-10-CM | POA: Diagnosis not present

## 2015-07-09 DIAGNOSIS — E782 Mixed hyperlipidemia: Secondary | ICD-10-CM

## 2015-07-09 DIAGNOSIS — E559 Vitamin D deficiency, unspecified: Secondary | ICD-10-CM

## 2015-07-09 LAB — HEPATIC FUNCTION PANEL
ALK PHOS: 64 U/L (ref 33–130)
ALT: 9 U/L (ref 6–29)
AST: 16 U/L (ref 10–35)
Albumin: 4 g/dL (ref 3.6–5.1)
Bilirubin, Direct: <0.1 mg/dL (ref <=0.2)
TOTAL PROTEIN: 6.3 g/dL (ref 6.1–8.1)
Total Bilirubin: 0.2 mg/dL (ref 0.2–1.2)

## 2015-07-09 LAB — LIPID PANEL
Cholesterol: 196 mg/dL (ref 125–200)
HDL: 55 mg/dL (ref >=46)
LDL CALC: 115 mg/dL (ref <130)
Total CHOL/HDL Ratio: 3.6 Ratio (ref <=5.0)
Triglycerides: 132 mg/dL (ref <150)
VLDL: 26 mg/dL (ref <30)

## 2015-07-09 LAB — CBC WITH DIFFERENTIAL/PLATELET
BASOS ABS: 0 10*3/uL (ref 0.0–0.1)
Basophils Relative: 1 % (ref 0–1)
Eosinophils Absolute: 0.1 10*3/uL (ref 0.0–0.7)
Eosinophils Relative: 2 % (ref 0–5)
HCT: 35.7 % — ABNORMAL LOW (ref 36.0–46.0)
Hemoglobin: 11.7 g/dL — ABNORMAL LOW (ref 12.0–15.0)
LYMPHS PCT: 46 % (ref 12–46)
Lymphs Abs: 2.3 10*3/uL (ref 0.7–4.0)
MCH: 32.5 pg (ref 26.0–34.0)
MCHC: 32.8 g/dL (ref 30.0–36.0)
MCV: 99.2 fL (ref 78.0–100.0)
MPV: 8.4 fL — AB (ref 8.6–12.4)
Monocytes Absolute: 0.5 10*3/uL (ref 0.1–1.0)
Monocytes Relative: 10 % (ref 3–12)
Neutro Abs: 2 10*3/uL (ref 1.7–7.7)
Neutrophils Relative %: 41 % — ABNORMAL LOW (ref 43–77)
Platelets: 291 10*3/uL (ref 150–400)
RBC: 3.6 MIL/uL — ABNORMAL LOW (ref 3.87–5.11)
RDW: 15 % (ref 11.5–15.5)
WBC: 4.9 10*3/uL (ref 4.0–10.5)

## 2015-07-09 LAB — BASIC METABOLIC PANEL WITH GFR
BUN: 8 mg/dL (ref 7–25)
CO2: 24 mmol/L (ref 20–31)
Calcium: 8.6 mg/dL (ref 8.6–10.4)
Chloride: 108 mmol/L (ref 98–110)
Creat: 0.57 mg/dL (ref 0.50–1.05)
GFR, Est African American: >89 mL/min (ref >=60)
GLUCOSE: 60 mg/dL — AB (ref 65–99)
POTASSIUM: 4.2 mmol/L (ref 3.5–5.3)
Sodium: 139 mmol/L (ref 135–146)

## 2015-07-09 LAB — TSH: TSH: 1.001 u[IU]/mL (ref 0.350–4.500)

## 2015-07-09 LAB — MAGNESIUM: MAGNESIUM: 1.8 mg/dL (ref 1.5–2.5)

## 2015-07-09 MED ORDER — EZETIMIBE 10 MG PO TABS: 10.0000 mg | ORAL_TABLET | Freq: Every day | ORAL | Status: DC

## 2015-07-09 NOTE — Progress Notes (Signed)
Assessment and Plan:  1. Hypertension -Continue medication, monitor blood pressure at home. Continue DASH diet.  Reminder to go to the ER if any CP, SOB, nausea, dizziness, severe HA, changes vision/speech, left arm numbness and tingling and jaw pain.  2. Cholesterol -Continue diet and exercise. Check cholesterol.  She can not tolerate statins, will try zetia again with the card.   3. Prediabetes  -Continue diet and exercise. Check A1C  4. Vitamin D Def - check level and continue medications.   5. Osteoporosis Get DEXA, s/p fall with fracture, on fosamax   Continue diet and meds as discussed. Further disposition pending results of labs. Over 30 minutes of exam, counseling, chart review, and critical decision making was performed Future Appointments Date Time Provider [place]  07/15/2015 11:00 AM [facility]  07/15/2015 12:00 PM Rosetta Posner, MD [facility]  01/21/2016 2:00 PM Vicie Mutters, PA-C [facility] None    HPI 59 y.o. female  presents for 3 month follow up on hypertension, cholesterol, prediabetes, and vitamin D deficiency.   Her blood pressure has been controlled at home, today their BP is BP: 130/72 mmHg  She does not workout. She denies chest pain, shortness of breath, dizziness. She has history of carotid stenosis s/p carotid endarterectomy with Dr. Donnetta Hutching in March., follows up on the 27th.  Has appointment with retina specialist, Dr. Tye Savoy next month.   She has COPD, has quit smoking x 1 year, on albuterol and advair.  She has history of gastric ulcers. She has history of osteoporosis, had recent right radius fracture in May. She is on fosamax, last DEXA 2014, she is due.  Her mom passed in June, has been stressful.   She is not on cholesterol medication, was on zetia but it was too expensive but she never got the discount card, will try again and denies myalgias. Her cholesterol is not at goal. The cholesterol last visit was:   Lab Results   Component Value Date   CHOL 201* 01/02/2015   HDL 44* 01/02/2015   LDLCALC 133* 01/02/2015   TRIG 118 01/02/2015   CHOLHDL 4.6 01/02/2015   . Last A1C in the office was:  Lab Results  Component Value Date   HGBA1C 5.4 10/03/2014   Patient is on Vitamin D supplement.   Lab Results  Component Value Date   VD25OH 31 01/02/2015      Current Medications:  Current Outpatient Prescriptions on File Prior to Visit  Medication Sig Dispense Refill  . acetaminophen (TYLENOL) 500 MG tablet Take 500 mg by mouth every 6 (six) hours as needed for moderate pain or headache.     Marland Kitchen acetaminophen-codeine (TYLENOL #3) 300-30 MG per tablet Take 1 tablet by mouth every 6 (six) hours as needed for moderate pain. MDD: 4 tablets per day (Patient not taking: Reported on 03/09/2015) 20 tablet 0  . albuterol (PROVENTIL HFA;VENTOLIN HFA) 108 (90 BASE) MCG/ACT inhaler Inhale 2 puffs into the lungs every 6 (six) hours as needed for wheezing or shortness of breath. 1 Inhaler 2  . alendronate (FOSAMAX) 70 MG tablet Take 70 mg by mouth every 7 (seven) days. Take with a full glass of water on an empty stomach.    Marland Kitchen aspirin EC 81 MG tablet Take 1 tablet (81 mg total) by mouth daily. 30 tablet 0  . cetirizine (ZYRTEC) 10 MG tablet Take 10 mg by mouth as needed for allergies.    . Cholecalciferol (VITAMIN D) 2000 UNITS  CAPS Take 7000 units a day     . citalopram (CELEXA) 20 MG tablet TAKE 1 TABLET (20 MG TOTAL) BY MOUTH DAILY. (Patient not taking: Reported on 03/09/2015) 90 tablet 0  . ezetimibe (ZETIA) 10 MG tablet Take 1 tablet (10 mg total) by mouth daily. 30 tablet 3  . Fluticasone-Salmeterol (ADVAIR DISKUS) 250-50 MCG/DOSE AEPB Inhale 1 puff into the lungs every 12 (twelve) hours.    Marland Kitchen omeprazole (PRILOSEC) 40 MG capsule TAKE 1 CAPSULE TWICE A DAY 60 capsule 3  . oxyCODONE-acetaminophen (PERCOCET/ROXICET) 5-325 MG per tablet Take 2 tablets by mouth every 6 (six) hours as needed for severe pain. 15 tablet 0  .  sucralfate (CARAFATE) 1 G tablet 1 PILL BEFORE MEALS 2-3 TIMES DAILY AS NEEDED FOR PAIN (Patient not taking: Reported on 03/09/2015) 60 tablet 0   No current facility-administered medications on file prior to visit.   Medical History:  Past Medical History  Diagnosis Date  . GERD (gastroesophageal reflux disease)   . Duodenal ulcer   . Internal hemorrhoids   . Hx of colonic polyps   . Arthritis   . Hyperlipemia   . Family history of malignant neoplasm of gastrointestinal tract   . COPD (chronic obstructive pulmonary disease)   . Asthma   . Vitamin D deficiency   . Allergy   . Anemia   . Blood transfusion without reported diagnosis   . Cataract   . Osteoporosis   . HTN (hypertension) 10/03/2014  . Peripheral vascular disease   . Shortness of breath dyspnea     with ambulation  . Pneumonia     hx of  . Hepatitis     had in 6th grade pt unsure which kind   Allergies:  Allergies  Allergen Reactions  . Celebrex [Celecoxib]     GI bleed  . Crestor [Rosuvastatin]     Made legs hurt  . Penicillins     REACTION: whelps  . Pravastatin     Muscle aches    Review of Systems:  Review of Systems  Constitutional: Negative.   HENT: Negative.   Eyes: Negative.   Respiratory: Negative.   Cardiovascular: Negative.   Gastrointestinal: Negative.   Genitourinary: Negative.   Musculoskeletal: Negative.   Skin: Negative.   Neurological: Negative.   Endo/Heme/Allergies: Negative.   Psychiatric/Behavioral: Negative.     Family history- Review and unchanged Social history- Review and unchanged Physical Exam: BP 130/72 mmHg  Pulse 84  Temp(Src) 97.7 F (36.5 C)  Resp 16  Ht 5\' 2"  (1.575 m)  Wt 103 lb (46.72 kg)  BMI 18.83 kg/m2 Wt Readings from Last 3 Encounters:  07/09/15 103 lb (46.72 kg)  03/09/15 103 lb (46.72 kg)  01/02/15 105 lb (47.628 kg)   General Appearance: Well nourished, in no apparent distress. Eyes: PERRLA, EOMs, conjunctiva no swelling or  erythema Sinuses: No Frontal/maxillary tenderness ENT/Mouth: Ext aud canals clear, TMs without erythema, bulging. No erythema, swelling, or exudate on post pharynx.  Tonsils not swollen or erythematous. Hearing normal.  Neck: Supple, thyroid normal.  Respiratory: Respiratory effort normal, BS equal bilaterally without rales, rhonchi, wheezing or stridor.  Cardio: RRR with no MRGs. Brisk peripheral pulses without edema.  Abdomen: Soft, + BS,  Non tender, no guarding, rebound, hernias, masses. Lymphatics: Non tender without lymphadenopathy.  Musculoskeletal: Full ROM, 5/5 strength, Normal gait Skin: Warm, dry without rashes, lesions, ecchymosis.  Neuro: Cranial nerves intact. Normal muscle tone, no cerebellar symptoms. Psych: Awake and oriented X 3, normal  affect, Insight and Judgment appropriate.    Vicie Mutters, PA-C 12:00 PM [facility]

## 2015-07-09 NOTE — Patient Instructions (Signed)
Osteoporosis Throughout your life, your body breaks down old bone and replaces it with new bone. As you get older, your body does not replace bone as quickly as it breaks it down. By the age of 30 years, most people begin to gradually lose bone because of the imbalance between bone loss and replacement. Some people lose more bone than others. Bone loss beyond a specified normal degree is considered osteoporosis.  Osteoporosis affects the strength and durability of your bones. The inside of the ends of your bones and your flat bones, like the bones of your pelvis, look like honeycomb, filled with tiny open spaces. As bone loss occurs, your bones become less dense. This means that the open spaces inside your bones become bigger and the walls between these spaces become thinner. This makes your bones weaker. Bones of a person with osteoporosis can become so weak that they can break (fracture) during minor accidents, such as a simple fall. CAUSES  The following factors have been associated with the development of osteoporosis:  Smoking.  Drinking more than 2 alcoholic drinks several days per week.  Long-term use of certain medicines:  Corticosteroids.  Chemotherapy medicines.  Thyroid medicines.  Antiepileptic medicines.  Gonadal hormone suppression medicine.  Immunosuppression medicine.  Being underweight.  Lack of physical activity.  Lack of exposure to the sun. This can lead to vitamin D deficiency.  Certain medical conditions:  Certain inflammatory bowel diseases, such as Crohn disease and ulcerative colitis.  Diabetes.  Hyperthyroidism.  Hyperparathyroidism. RISK FACTORS Anyone can develop osteoporosis. However, the following factors can increase your risk of developing osteoporosis:  Gender--Women are at higher risk than men.  Age--Being older than 50 years increases your risk.  Ethnicity--White and Asian people have an increased risk.  Weight --Being extremely  underweight can increase your risk of osteoporosis.  Family history of osteoporosis--Having a family member who has developed osteoporosis can increase your risk. SYMPTOMS  Usually, people with osteoporosis have no symptoms.  DIAGNOSIS  Signs during a physical exam that may prompt your caregiver to suspect osteoporosis include:  Decreased height. This is usually caused by the compression of the bones that form your spine (vertebrae) because they have weakened and become fractured.  A curving or rounding of the upper back (kyphosis). To confirm signs of osteoporosis, your caregiver may request a procedure that uses 2 low-dose X-ray beams with different levels of energy to measure your bone mineral density (dual-energy X-ray absorptiometry [DXA]). Also, your caregiver may check your level of vitamin D. TREATMENT  The goal of osteoporosis treatment is to strengthen bones in order to decrease the risk of bone fractures. There are different types of medicines available to help achieve this goal. Some of these medicines work by slowing the processes of bone loss. Some medicines work by increasing bone density. Treatment also involves making sure that your levels of calcium and vitamin D are adequate. PREVENTION  There are things you can do to help prevent osteoporosis. Adequate intake of calcium and vitamin D can help you achieve optimal bone mineral density. Regular exercise can also help, especially resistance and weight-bearing activities. If you smoke, quitting smoking is an important part of osteoporosis prevention. MAKE SURE YOU:  Understand these instructions.  Will watch your condition.  Will get help right away if you are not doing well or get worse. FOR MORE INFORMATION www.osteo.org and www.nof.org Document Released: 07/14/2005 Document Revised: 01/29/2013 Document Reviewed: 09/18/2011 [employer] Patient Information 2015 [employer]. This information is not   intended to replace advice  given to you by your health care provider. Make sure you discuss any questions you have with your health care provider.   Cholesterol Cholesterol is a white, waxy, fat-like substance needed by your body in small amounts. The liver makes all the cholesterol you need. Cholesterol is carried from the liver by the blood through the blood vessels. Deposits of cholesterol (plaque) may build up on blood vessel walls. These make the arteries narrower and stiffer. Cholesterol plaques increase the risk for heart attack and stroke.  You cannot feel your cholesterol level even if it is very high. The only way to know it is high is with a blood test. Once you know your cholesterol levels, you should keep a record of the test results. Work with your health care provider to keep your levels in the desired range.  WHAT DO THE RESULTS MEAN?  Total cholesterol is a rough measure of all the cholesterol in your blood.   LDL is the so-called bad cholesterol. This is the type that deposits cholesterol in the walls of the arteries. You want this level to be low.   HDL is the good cholesterol because it cleans the arteries and carries the LDL away. You want this level to be high.  Triglycerides are fat that the body can either burn for energy or store. High levels are closely linked to heart disease.  WHAT ARE THE DESIRED LEVELS OF CHOLESTEROL?  Total cholesterol below 200.   LDL below 100 for people at risk, below 70 for those at very high risk.   HDL above 50 is good, above 60 is best.   Triglycerides below 150.  HOW CAN I LOWER MY CHOLESTEROL?  Diet. Follow your diet programs as directed by your health care provider.   Choose fish or white meat chicken and [place], roasted or baked. Limit fatty cuts of red meat, fried foods, and processed meats, such as sausage and lunch meats.   Eat lots of fresh fruits and vegetables.  Choose whole grains, beans, pasta, potatoes, and cereals.   Use only small  amounts of olive, corn, or canola oils.   Avoid butter, mayonnaise, shortening, or palm kernel oils.  Avoid foods with trans fats.   Drink skim or nonfat milk and eat low-fat or nonfat yogurt and cheeses. Avoid whole milk, cream, ice cream, egg yolks, and full-fat cheeses.   Healthy desserts include angel food cake, ginger snaps, animal crackers, hard candy, popsicles, and low-fat or nonfat frozen yogurt. Avoid pastries, cakes, pies, and cookies.   Exercise. Follow your exercise programs as directed by your health care provider.   A regular program helps decrease LDL and raise HDL.   A regular program helps with weight control.   Do things that increase your activity level like gardening, walking, or taking the stairs. Ask your health care provider about how you can be more active in your daily life.   Medicine. Take medicine only as directed by your health care provider.   Medicine may be prescribed by your health care provider to help lower cholesterol and decrease the risk for heart disease.   If you have several risk factors, you may need medicine even if your levels are normal. Document Released: 06/29/2001 Document Revised: 02/18/2014 Document Reviewed: 07/18/2013 [facility] Patient Information 2015 [place]. This information is not intended to replace advice given to you by your health care provider. Make sure you discuss any questions you have with your health care provider.

## 2015-07-10 LAB — VITAMIN D 25 HYDROXY (VIT D DEFICIENCY, FRACTURES): VIT D 25 HYDROXY: 36 ng/mL (ref 30–100)

## 2015-07-14 ENCOUNTER — Encounter: Payer: Self-pay | Admitting: Vascular Surgery

## 2015-07-15 ENCOUNTER — Other Ambulatory Visit: Payer: Self-pay | Admitting: Vascular Surgery

## 2015-07-15 ENCOUNTER — Encounter: Payer: Self-pay | Admitting: Vascular Surgery

## 2015-07-15 ENCOUNTER — Ambulatory Visit (INDEPENDENT_AMBULATORY_CARE_PROVIDER_SITE_OTHER): Payer: BLUE CROSS/BLUE SHIELD | Admitting: Vascular Surgery

## 2015-07-15 ENCOUNTER — Ambulatory Visit (HOSPITAL_COMMUNITY)
Admission: RE | Admit: 2015-07-15 | Discharge: 2015-07-15 | Disposition: A | Discharge status: Home / Self Care | Payer: BLUE CROSS/BLUE SHIELD | Source: Ambulatory Visit | Attending: Vascular Surgery | Admitting: Vascular Surgery

## 2015-07-15 VITALS — BP 109/65 | HR 78 | Temp 97.5°F | Resp 16 | Ht 62.0 in | Wt 99.7 lb

## 2015-07-15 DIAGNOSIS — I6523 Occlusion and stenosis of bilateral carotid arteries: Secondary | ICD-10-CM

## 2015-07-15 DIAGNOSIS — I6522 Occlusion and stenosis of left carotid artery: Secondary | ICD-10-CM | POA: Diagnosis not present

## 2015-07-15 DIAGNOSIS — Z48812 Encounter for surgical aftercare following surgery on the circulatory system: Secondary | ICD-10-CM | POA: Diagnosis not present

## 2015-07-15 DIAGNOSIS — I10 Essential (primary) hypertension: Secondary | ICD-10-CM | POA: Insufficient documentation

## 2015-07-15 DIAGNOSIS — E785 Hyperlipidemia, unspecified: Secondary | ICD-10-CM | POA: Diagnosis not present

## 2015-07-15 NOTE — Progress Notes (Signed)
Here today for follow-up of right carotid endarterectomy for severe asymptomatic disease on 2/29 2016. She continues to do white quite well. She does report she has had some concerns regarding her rhinitis to see a retinal specialist. She denies any focal neurologic deficits. She's had no cardiac disease.  Past Medical History  Diagnosis Date  . GERD (gastroesophageal reflux disease)   . Duodenal ulcer   . Internal hemorrhoids   . Hx of colonic polyps   . Arthritis   . Hyperlipemia   . Family history of malignant neoplasm of gastrointestinal tract   . COPD (chronic obstructive pulmonary disease)   . Asthma   . Vitamin D deficiency   . Allergy   . Anemia   . Blood transfusion without reported diagnosis   . Cataract   . Osteoporosis   . HTN (hypertension) 10/03/2014  . Peripheral vascular disease   . Shortness of breath dyspnea     with ambulation  . Pneumonia     hx of  . Hepatitis     had in 6th grade pt unsure which kind    Social History  Substance Use Topics  . Smoking status: Former Smoker -- 1.00 packs/day for 36 years    Types: Cigarettes    Quit date: 06/18/2014  . Smokeless tobacco: Never Used     Comment: was taking chantix but it gave her a headache  . Alcohol Use: 0.0 oz/week    0 Standard drinks or equivalent per week     Comment: occasional    Family History  Problem Relation Age of Onset  . Colon polyps Mother   . Inflammatory bowel disease Mother   . Hypertension Mother   . Stroke Mother   . Heart disease Mother   . Hyperlipidemia Mother   . Varicose Veins Mother   . Heart attack Mother   . AAA (abdominal aortic aneurysm) Mother   . Diabetes Maternal Grandfather   . Heart disease Maternal Grandfather   . Heart disease Maternal Grandmother   . Colon cancer      mat great aunt  . Hypertension Father   . Hyperlipidemia Father   . Heart disease Father   . Heart attack Father   . AAA (abdominal aortic aneurysm) Father   . Heart disease Brother    . Hyperlipidemia Brother   . Hypertension Brother   . Heart attack Brother   . AAA (abdominal aortic aneurysm) Brother   . Esophageal cancer Neg Hx   . Stomach cancer Neg Hx   . Rectal cancer Neg Hx   . Varicose Veins Daughter     Allergies  Allergen Reactions  . Celebrex [Celecoxib]     GI bleed  . Crestor [Rosuvastatin]     Made legs hurt  . Penicillins     REACTION: whelps  . Pravastatin     Muscle aches     Current outpatient prescriptions:  .  acetaminophen (TYLENOL) 500 MG tablet, Take 500 mg by mouth every 6 (six) hours as needed for moderate pain or headache. , Disp: , Rfl:  .  albuterol (PROVENTIL HFA;VENTOLIN HFA) 108 (90 BASE) MCG/ACT inhaler, Inhale 2 puffs into the lungs every 6 (six) hours as needed for wheezing or shortness of breath., Disp: 1 Inhaler, Rfl: 2 .  alendronate (FOSAMAX) 70 MG tablet, Take 70 mg by mouth every 7 (seven) days. Take with a full glass of water on an empty stomach., Disp: , Rfl:  .  aspirin EC 81 MG  tablet, Take 1 tablet (81 mg total) by mouth daily., Disp: 30 tablet, Rfl: 0 .  cetirizine (ZYRTEC) 10 MG tablet, Take 10 mg by mouth as needed for allergies., Disp: , Rfl:  .  Cholecalciferol (VITAMIN D) 2000 UNITS CAPS, Take 7000 units a day , Disp: , Rfl:  .  ezetimibe (ZETIA) 10 MG tablet, Take 1 tablet (10 mg total) by mouth daily., Disp: 30 tablet, Rfl: 5 .  Fluticasone-Salmeterol (ADVAIR DISKUS) 250-50 MCG/DOSE AEPB, Inhale 1 puff into the lungs every 12 (twelve) hours., Disp: , Rfl:  .  omeprazole (PRILOSEC) 40 MG capsule, TAKE 1 CAPSULE TWICE A DAY, Disp: 60 capsule, Rfl: 3 .  oxyCODONE-acetaminophen (PERCOCET/ROXICET) 5-325 MG per tablet, Take 2 tablets by mouth every 6 (six) hours as needed for severe pain. (Patient not taking: Reported on 07/15/2015), Disp: 15 tablet, Rfl: 0  Filed Vitals:   07/15/15 1141 07/15/15 1143  BP: 114/66 109/65  Pulse: 80 78  Temp: 97.5 F (36.4 C)   Resp: 16   Height: 5\' 2"  (1.575 m)   Weight: 99  lb 11.2 oz (45.224 kg)   SpO2: 97%     Body mass index is 18.23 kg/(m^2).       Physical exam her right neck incision is healed quite nicely. She has no bruits. Radial pulses are 2+ bilaterally. Neurologically she is grossly intact Respirations are equal in nonlabored  Carotid duplex today was reviewed with the patient. This shows widely patent endarterectomy on the right is no significant stenosis in her left carotid artery.  Impression and plan: Stable status post carotid endarterectomy February 2016. Will continue usual activities. We will see her in 6 months with repeat carotid duplex and then drop back to yearly duplex assuming she continues to have no difficulty. She will notify should she develop any wound problems or neurologic deficits

## 2015-07-15 NOTE — Addendum Note (Signed)
Addended by: Dorthula Rue L on: 07/15/2015 02:06 PM   Modules accepted: Orders

## 2015-08-07 ENCOUNTER — Other Ambulatory Visit: Payer: Self-pay | Admitting: *Deleted

## 2015-08-07 MED ORDER — OMEPRAZOLE 40 MG PO CPDR: 40.0000 mg | DELAYED_RELEASE_CAPSULE | Freq: Two times a day (BID) | ORAL | Status: DC

## 2015-10-17 ENCOUNTER — Ambulatory Visit (INDEPENDENT_AMBULATORY_CARE_PROVIDER_SITE_OTHER): Payer: BLUE CROSS/BLUE SHIELD | Admitting: Physician Assistant

## 2015-10-17 ENCOUNTER — Ambulatory Visit
Admission: RE | Admit: 2015-10-17 | Discharge: 2015-10-17 | Disposition: A | Discharge status: Home / Self Care | Payer: BLUE CROSS/BLUE SHIELD | Source: Ambulatory Visit | Attending: Physician Assistant | Admitting: Physician Assistant

## 2015-10-17 ENCOUNTER — Encounter: Payer: Self-pay | Admitting: Physician Assistant

## 2015-10-17 VITALS — BP 116/72 | HR 80 | Temp 98.4°F | Resp 16 | Ht 62.25 in | Wt 109.4 lb

## 2015-10-17 DIAGNOSIS — J449 Chronic obstructive pulmonary disease, unspecified: Secondary | ICD-10-CM

## 2015-10-17 DIAGNOSIS — E782 Mixed hyperlipidemia: Secondary | ICD-10-CM | POA: Diagnosis not present

## 2015-10-17 DIAGNOSIS — E559 Vitamin D deficiency, unspecified: Secondary | ICD-10-CM | POA: Diagnosis not present

## 2015-10-17 DIAGNOSIS — R7309 Other abnormal glucose: Secondary | ICD-10-CM

## 2015-10-17 DIAGNOSIS — K219 Gastro-esophageal reflux disease without esophagitis: Secondary | ICD-10-CM | POA: Diagnosis not present

## 2015-10-17 DIAGNOSIS — I10 Essential (primary) hypertension: Secondary | ICD-10-CM | POA: Diagnosis not present

## 2015-10-17 DIAGNOSIS — Z79899 Other long term (current) drug therapy: Secondary | ICD-10-CM

## 2015-10-17 DIAGNOSIS — M810 Age-related osteoporosis without current pathological fracture: Secondary | ICD-10-CM

## 2015-10-17 LAB — BASIC METABOLIC PANEL WITH GFR
BUN: 21 mg/dL (ref 7–25)
CALCIUM: 9.3 mg/dL (ref 8.6–10.4)
CO2: 22 mmol/L (ref 20–31)
CREATININE: 0.71 mg/dL (ref 0.50–1.05)
Chloride: 111 mmol/L — ABNORMAL HIGH (ref 98–110)
GFR, Est African American: >89 mL/min (ref >=60)
GFR, Est Non African American: >89 mL/min (ref >=60)
GLUCOSE: 75 mg/dL (ref 65–99)
Potassium: 5.2 mmol/L (ref 3.5–5.3)
Sodium: 138 mmol/L (ref 135–146)

## 2015-10-17 LAB — TSH: TSH: 1.582 u[IU]/mL (ref 0.350–4.500)

## 2015-10-17 LAB — CBC WITH DIFFERENTIAL/PLATELET
BASOS ABS: 0.1 10*3/uL (ref 0.0–0.1)
Basophils Relative: 1 % (ref 0–1)
EOS ABS: 0.2 10*3/uL (ref 0.0–0.7)
EOS PCT: 4 % (ref 0–5)
HEMATOCRIT: 32.4 % — AB (ref 36.0–46.0)
Hemoglobin: 10.6 g/dL — ABNORMAL LOW (ref 12.0–15.0)
LYMPHS ABS: 2.1 10*3/uL (ref 0.7–4.0)
Lymphocytes Relative: 37 % (ref 12–46)
MCH: 30.3 pg (ref 26.0–34.0)
MCHC: 32.7 g/dL (ref 30.0–36.0)
MCV: 92.6 fL (ref 78.0–100.0)
MPV: 8.8 fL (ref 8.6–12.4)
Monocytes Absolute: 0.7 10*3/uL (ref 0.1–1.0)
Monocytes Relative: 13 % — ABNORMAL HIGH (ref 3–12)
NEUTROS PCT: 45 % (ref 43–77)
Neutro Abs: 2.5 10*3/uL (ref 1.7–7.7)
Platelets: 330 10*3/uL (ref 150–400)
RBC: 3.5 MIL/uL — AB (ref 3.87–5.11)
RDW: 15.9 % — ABNORMAL HIGH (ref 11.5–15.5)
WBC: 5.6 10*3/uL (ref 4.0–10.5)

## 2015-10-17 LAB — HEPATIC FUNCTION PANEL
ALT: 8 U/L (ref 6–29)
AST: 14 U/L (ref 10–35)
Albumin: 4.2 g/dL (ref 3.6–5.1)
Alkaline Phosphatase: 67 U/L (ref 33–130)
Bilirubin, Direct: <0.1 mg/dL (ref <=0.2)
TOTAL PROTEIN: 6.9 g/dL (ref 6.1–8.1)
Total Bilirubin: 0.1 mg/dL — ABNORMAL LOW (ref 0.2–1.2)

## 2015-10-17 LAB — MAGNESIUM: MAGNESIUM: 1.9 mg/dL (ref 1.5–2.5)

## 2015-10-17 LAB — LIPID PANEL
CHOLESTEROL: 166 mg/dL (ref 125–200)
HDL: 24 mg/dL — ABNORMAL LOW (ref >=46)
LDL Cholesterol: 87 mg/dL (ref <130)
Total CHOL/HDL Ratio: 6.9 Ratio — ABNORMAL HIGH (ref <=5.0)
Triglycerides: 274 mg/dL — ABNORMAL HIGH (ref <150)
VLDL: 55 mg/dL — AB (ref <30)

## 2015-10-17 NOTE — Patient Instructions (Signed)
Add ENTERIC COATED low dose 81 mg Aspirin daily OR can do every other day if you have easy bruising to protect your heart and head. As well as to reduce risk of Colon Cancer by 20 %, Skin Cancer by 26 % , Melanoma by 46% and Pancreatic cancer by 60%   Vitamin D goal is between 60-80  Please make sure that you are taking your Vitamin D as directed.   It is very important as a natural anti-inflammatory   helping hair, skin, and nails, as well as reducing stroke and heart attack risk.   It helps your bones and helps with mood.  It also decreases numerous cancer risks so please take it as directed.   Low Vit D is associated with a 200-300% higher risk for CANCER   and 200-300% higher risk for HEART   ATTACK  &  STROKE.    .....................................Marland Kitchen  It is also associated with higher death rate at younger ages,   autoimmune diseases like Rheumatoid arthritis, Lupus, Multiple Sclerosis.     Also many other serious conditions, like depression, Alzheimer's  Dementia, infertility, muscle aches, fatigue, fibromyalgia - just to name a few.  +++++++++++++++++++   Chronic Obstructive Pulmonary Disease Chronic obstructive pulmonary disease (COPD) is a common lung condition in which airflow from the lungs is limited. COPD is a general term that can be used to describe many different lung problems that limit airflow, including both chronic bronchitis and emphysema. If you have COPD, your lung function will probably never return to normal, but there are measures you can take to improve lung function and make yourself feel better. CAUSES   Smoking (common).  Exposure to secondhand smoke.  Genetic problems.  Chronic inflammatory lung diseases or recurrent infections. SYMPTOMS  Shortness of breath, especially with physical activity.  Deep, persistent (chronic) cough with a large amount of thick mucus.  Wheezing.  Rapid breaths (tachypnea).  Gray or bluish discoloration  (cyanosis) of the skin, especially in your fingers, toes, or lips.  Fatigue.  Weight loss.  Frequent infections or episodes when breathing symptoms become much worse (exacerbations).  Chest tightness. DIAGNOSIS Your health care provider will take a medical history and perform a physical examination to diagnose COPD. Additional tests for COPD may include:  Lung (pulmonary) function tests.  Chest X-ray.  CT scan.  Blood tests. TREATMENT  Treatment for COPD may include:  Inhaler and nebulizer medicines. These help manage the symptoms of COPD and make your breathing more comfortable.  Supplemental oxygen. Supplemental oxygen is only helpful if you have a low oxygen level in your blood.  Exercise and physical activity. These are beneficial for nearly all people with COPD.  Lung surgery or transplant.  Nutrition therapy to gain weight, if you are underweight.  Pulmonary rehabilitation. This may involve working with a team of health care providers and specialists, such as respiratory, occupational, and physical therapists. HOME CARE INSTRUCTIONS  Take all medicines (inhaled or pills) as directed by your health care provider.  Avoid over-the-counter medicines or cough syrups that dry up your airway (such as antihistamines) and slow down the elimination of secretions unless instructed otherwise by your health care provider.  If you are a smoker, the most important thing that you can do is stop smoking. Continuing to smoke will cause further lung damage and breathing trouble. Ask your health care provider for help with quitting smoking. He or she can direct you to community resources or hospitals that provide support.  Avoid  exposure to irritants such as smoke, chemicals, and fumes that aggravate your breathing.  Use oxygen therapy and pulmonary rehabilitation if directed by your health care provider. If you require home oxygen therapy, ask your health care provider whether you  should purchase a pulse oximeter to measure your oxygen level at home.  Avoid contact with individuals who have a contagious illness.  Avoid extreme temperature and humidity changes.  Eat healthy foods. Eating smaller, more frequent meals and resting before meals may help you maintain your strength.  Stay active, but balance activity with periods of rest. Exercise and physical activity will help you maintain your ability to do things you want to do.  Preventing infection and hospitalization is very important when you have COPD. Make sure to receive all the vaccines your health care provider recommends, especially the pneumococcal and influenza vaccines. Ask your health care provider whether you need a pneumonia vaccine.  Learn and use relaxation techniques to manage stress.  Learn and use controlled breathing techniques as directed by your health care provider. Controlled breathing techniques include:  Pursed lip breathing. Start by breathing in (inhaling) through your nose for 1 second. Then, purse your lips as if you were going to whistle and breathe out (exhale) through the pursed lips for 2 seconds.  Diaphragmatic breathing. Start by putting one hand on your abdomen just above your waist. Inhale slowly through your nose. The hand on your abdomen should move out. Then purse your lips and exhale slowly. You should be able to feel the hand on your abdomen moving in as you exhale.  Learn and use controlled coughing to clear mucus from your lungs. Controlled coughing is a series of short, progressive coughs. The steps of controlled coughing are: 1. Lean your head slightly forward. 2. Breathe in deeply using diaphragmatic breathing. 3. Try to hold your breath for 3 seconds. 4. Keep your mouth slightly open while coughing twice. 5. Spit any mucus out into a tissue. 6. Rest and repeat the steps once or twice as needed. SEEK MEDICAL CARE IF:  You are coughing up more mucus than  usual.  There is a change in the color or thickness of your mucus.  Your breathing is more labored than usual.  Your breathing is faster than usual. SEEK IMMEDIATE MEDICAL CARE IF:  You have shortness of breath while you are resting.  You have shortness of breath that prevents you from:  Being able to talk.  Performing your usual physical activities.  You have chest pain lasting longer than 5 minutes.  Your skin color is more cyanotic than usual.  You measure low oxygen saturations for longer than 5 minutes with a pulse oximeter. MAKE SURE YOU:  Understand these instructions.  Will watch your condition.  Will get help right away if you are not doing well or get worse.   This information is not intended to replace advice given to you by your health care provider. Make sure you discuss any questions you have with your health care provider.   Document Released: 07/14/2005 Document Revised: 10/25/2014 Document Reviewed: 05/31/2013 Elsevier Interactive Patient Education [employer].

## 2015-10-17 NOTE — Progress Notes (Signed)
Assessment and Plan:  1. Hypertension -Continue medication, monitor blood pressure at home. Continue DASH diet.  Reminder to go to the ER if any CP, SOB, nausea, dizziness, severe HA, changes vision/speech, left arm numbness and tingling and jaw pain.  2. Cholesterol -Continue diet and exercise. Check cholesterol.  She can not tolerate statins, continue zetia every other day  3. Prediabetes  -Continue diet and exercise. Check A1C  4. Vitamin D Def - check level and continue medications.   5. Osteoporosis Get DEXA TODAY s/p fall with fracture, on fosamax   Continue diet and meds as discussed. Further disposition pending results of labs. Over 30 minutes of exam, counseling, chart review, and critical decision making was performed Future Appointments Date Time Provider [place]  10/17/2015 1:30 PM GI-[facility] DX DEXA 1 GI-[facility]  01/20/2016 8:00 AM [facility]-CV HS VASC 2 [facility]I [facility]  01/20/2016 9:00 AM Sharmon Leyden Nickel, NP [facility]  01/21/2016 2:00 PM Vicie Mutters, PA-C [facility] None    HPI 59 y.o. female  presents for 3 month follow up on hypertension, cholesterol, prediabetes, and vitamin D deficiency.   Her blood pressure has been controlled at home, today their BP is BP: 116/72 mmHg  She does not workout. She denies chest pain, shortness of breath, dizziness. She has history of carotid stenosis s/p carotid endarterectomy with Dr. Donnetta Hutching in March., follows up on the 27th.   She has COPD, has quit smoking x 1 year, on albuterol and advair.  She has history of gastric ulcers. She has history of osteoporosis, had recent right radius fracture in May. She is on fosamax, last DEXA 2014, she is going today for that.  Her mom passed in June, has been stressful, had some sadness this christmas being the first christmas without her.   She is not on cholesterol medication, she is on the zetia every other day due to cost. Her cholesterol is not at goal. The cholesterol last visit  was:   Lab Results  Component Value Date   CHOL 196 07/09/2015   HDL 55 07/09/2015   LDLCALC 115 07/09/2015   TRIG 132 07/09/2015   CHOLHDL 3.6 07/09/2015   . Last A1C in the office was:  Lab Results  Component Value Date   HGBA1C 5.4 10/03/2014   Patient is on Vitamin D supplement.   Lab Results  Component Value Date   VD25OH 36 07/09/2015      Current Medications:  Current Outpatient Prescriptions on File Prior to Visit  Medication Sig Dispense Refill  . acetaminophen (TYLENOL) 500 MG tablet Take 500 mg by mouth every 6 (six) hours as needed for moderate pain or headache.     . albuterol (PROVENTIL HFA;VENTOLIN HFA) 108 (90 BASE) MCG/ACT inhaler Inhale 2 puffs into the lungs every 6 (six) hours as needed for wheezing or shortness of breath. 1 Inhaler 2  . alendronate (FOSAMAX) 70 MG tablet Take 70 mg by mouth every 7 (seven) days. Take with a full glass of water on an empty stomach.    Marland Kitchen aspirin EC 81 MG tablet Take 1 tablet (81 mg total) by mouth daily. 30 tablet 0  . cetirizine (ZYRTEC) 10 MG tablet Take 10 mg by mouth as needed for allergies.    . Cholecalciferol (VITAMIN D) 2000 UNITS CAPS Take 7000 units a day     . ezetimibe (ZETIA) 10 MG tablet Take 1 tablet (10 mg total) by mouth daily. 30 tablet 5  . Fluticasone-Salmeterol (ADVAIR DISKUS) 250-50  MCG/DOSE AEPB Inhale 1 puff into the lungs every 12 (twelve) hours.    Marland Kitchen omeprazole (PRILOSEC) 40 MG capsule Take 1 capsule (40 mg total) by mouth 2 (two) times daily. 60 capsule 3   No current facility-administered medications on file prior to visit.   Medical History:  Past Medical History  Diagnosis Date  . GERD (gastroesophageal reflux disease)   . Duodenal ulcer   . Internal hemorrhoids   . Hx of colonic polyps   . Arthritis   . Hyperlipemia   . Family history of malignant neoplasm of gastrointestinal tract   . COPD (chronic obstructive pulmonary disease) ([place])   . Asthma   . Vitamin D deficiency   . Allergy    . Anemia   . Blood transfusion without reported diagnosis   . Cataract   . Osteoporosis   . HTN (hypertension) 10/03/2014  . Peripheral vascular disease ([place])   . Shortness of breath dyspnea     with ambulation  . Pneumonia     hx of  . Hepatitis     had in 6th grade pt unsure which kind   Allergies:  Allergies  Allergen Reactions  . Celebrex [Celecoxib]     GI bleed  . Crestor [Rosuvastatin]     Made legs hurt  . Penicillins     REACTION: whelps  . Pravastatin     Muscle aches    Review of Systems:  Review of Systems  Constitutional: Negative.   HENT: Negative.   Eyes: Negative.   Respiratory: Negative.   Cardiovascular: Negative.   Gastrointestinal: Negative.   Genitourinary: Negative.   Musculoskeletal: Negative.   Skin: Negative.   Neurological: Negative.   Endo/Heme/Allergies: Negative.   Psychiatric/Behavioral: Negative.     Family history- Review and unchanged Social history- Review and unchanged Physical Exam: BP 116/72 mmHg  Pulse 80  Temp(Src) 98.4 F (36.9 C)  Resp 16  Ht 5' 2.25" (1.581 m)  Wt 109 lb 6.4 oz (49.624 kg)  BMI 19.85 kg/m2 Wt Readings from Last 3 Encounters:  10/17/15 109 lb 6.4 oz (49.624 kg)  07/15/15 99 lb 11.2 oz (45.224 kg)  07/09/15 103 lb (46.72 kg)   General Appearance: Well nourished, in no apparent distress. Eyes: PERRLA, EOMs, conjunctiva no swelling or erythema Sinuses: No Frontal/maxillary tenderness ENT/Mouth: Ext aud canals clear, TMs without erythema, bulging. No erythema, swelling, or exudate on post pharynx.  Tonsils not swollen or erythematous. Hearing normal.  Neck: Supple, thyroid normal.  Respiratory: Respiratory effort normal, BS equal bilaterally without rales, rhonchi, wheezing or stridor.  Cardio: RRR with no MRGs. Brisk peripheral pulses without edema.  Abdomen: Soft, + BS,  Non tender, no guarding, rebound, hernias, masses. Lymphatics: Non tender without lymphadenopathy.  Musculoskeletal: Full  ROM, 5/5 strength, Normal gait Skin: Warm, dry without rashes, lesions, ecchymosis.  Neuro: Cranial nerves intact. Normal muscle tone, no cerebellar symptoms. Psych: Awake and oriented X 3, normal affect, Insight and Judgment appropriate.    Vicie Mutters, PA-C 12:08 PM [facility]

## 2015-10-18 LAB — HEMOGLOBIN A1C
HEMOGLOBIN A1C: 5.3 % (ref <5.7)
MEAN PLASMA GLUCOSE: 105 mg/dL (ref <117)

## 2015-10-18 LAB — VITAMIN D 25 HYDROXY (VIT D DEFICIENCY, FRACTURES): VIT D 25 HYDROXY: 34 ng/mL (ref 30–100)

## 2015-10-18 LAB — INSULIN, RANDOM: Insulin: 7.8 u[IU]/mL (ref 2.0–19.6)

## 2015-10-23 ENCOUNTER — Ambulatory Visit (INDEPENDENT_AMBULATORY_CARE_PROVIDER_SITE_OTHER): Payer: BLUE CROSS/BLUE SHIELD | Admitting: *Deleted

## 2015-10-23 DIAGNOSIS — E538 Deficiency of other specified B group vitamins: Secondary | ICD-10-CM | POA: Diagnosis not present

## 2015-10-23 DIAGNOSIS — D649 Anemia, unspecified: Secondary | ICD-10-CM

## 2015-10-23 LAB — CBC WITH DIFFERENTIAL/PLATELET
Basophils Absolute: 0.1 10*3/uL (ref 0.0–0.1)
Basophils Relative: 1 % (ref 0–1)
EOS PCT: 4 % (ref 0–5)
Eosinophils Absolute: 0.2 10*3/uL (ref 0.0–0.7)
HEMATOCRIT: 31.3 % — AB (ref 36.0–46.0)
HEMOGLOBIN: 10.1 g/dL — AB (ref 12.0–15.0)
LYMPHS ABS: 1.9 10*3/uL (ref 0.7–4.0)
LYMPHS PCT: 35 % (ref 12–46)
MCH: 29.6 pg (ref 26.0–34.0)
MCHC: 32.3 g/dL (ref 30.0–36.0)
MCV: 91.8 fL (ref 78.0–100.0)
MONO ABS: 0.6 10*3/uL (ref 0.1–1.0)
MONOS PCT: 11 % (ref 3–12)
MPV: 8.6 fL (ref 8.6–12.4)
NEUTROS ABS: 2.6 10*3/uL (ref 1.7–7.7)
Neutrophils Relative %: 49 % (ref 43–77)
Platelets: 264 10*3/uL (ref 150–400)
RBC: 3.41 MIL/uL — AB (ref 3.87–5.11)
RDW: 16.4 % — AB (ref 11.5–15.5)
WBC: 5.3 10*3/uL (ref 4.0–10.5)

## 2015-10-23 LAB — RETICULOCYTES
ABS Retic: 71.6 10*3/uL (ref 19.0–186.0)
RBC.: 3.41 MIL/uL — AB (ref 3.87–5.11)
Retic Ct Pct: 2.1 % (ref 0.4–2.3)

## 2015-10-23 NOTE — Progress Notes (Signed)
Patient ID: [name], female   DOB: 09/28/1956, 60 y.o.   MRN: [id number] Patient here today for additional labs for anemia and vitamin B 12 deficiency.

## 2015-10-24 LAB — IRON,TIBC AND FERRITIN PANEL
%SAT: 4 % — AB (ref 11–50)
Ferritin: 12 ng/mL (ref 10–291)
IRON: 24 ug/dL — AB (ref 45–160)
TIBC: 538 ug/dL — AB (ref 250–450)

## 2015-10-24 LAB — ANTI-DNA ANTIBODY, DOUBLE-STRANDED: ds DNA Ab: 1 IU/mL

## 2015-10-24 LAB — FOLATE RBC: RBC Folate: 577 ng/mL (ref >280)

## 2015-10-26 LAB — VITAMIN B1: VITAMIN B1 (THIAMINE): 12 nmol/L (ref 8–30)

## 2015-10-28 ENCOUNTER — Other Ambulatory Visit: Payer: Self-pay | Admitting: Physician Assistant

## 2015-10-28 DIAGNOSIS — D649 Anemia, unspecified: Secondary | ICD-10-CM

## 2015-10-29 ENCOUNTER — Telehealth: Payer: Self-pay

## 2015-10-29 NOTE — Telephone Encounter (Signed)
Patient needs to schedule 1 month [place] to recheck iron. Left message for patient to return my call to schedule.

## 2015-11-27 ENCOUNTER — Other Ambulatory Visit: Payer: Self-pay | Admitting: Internal Medicine

## 2015-11-28 ENCOUNTER — Other Ambulatory Visit: Payer: BLUE CROSS/BLUE SHIELD

## 2015-11-28 DIAGNOSIS — D649 Anemia, unspecified: Secondary | ICD-10-CM

## 2015-11-28 LAB — CBC WITH DIFFERENTIAL/PLATELET
BASOS ABS: 0.1 10*3/uL (ref 0.0–0.1)
Basophils Relative: 1 % (ref 0–1)
EOS PCT: 3 % (ref 0–5)
Eosinophils Absolute: 0.2 10*3/uL (ref 0.0–0.7)
HEMATOCRIT: 32.6 % — AB (ref 36.0–46.0)
Hemoglobin: 10.7 g/dL — ABNORMAL LOW (ref 12.0–15.0)
LYMPHS ABS: 1.6 10*3/uL (ref 0.7–4.0)
LYMPHS PCT: 24 % (ref 12–46)
MCH: 29.8 pg (ref 26.0–34.0)
MCHC: 32.8 g/dL (ref 30.0–36.0)
MCV: 90.8 fL (ref 78.0–100.0)
MONOS PCT: 10 % (ref 3–12)
MPV: 8.5 fL — AB (ref 8.6–12.4)
Monocytes Absolute: 0.7 10*3/uL (ref 0.1–1.0)
Neutro Abs: 4 10*3/uL (ref 1.7–7.7)
Neutrophils Relative %: 62 % (ref 43–77)
Platelets: 245 10*3/uL (ref 150–400)
RBC: 3.59 MIL/uL — ABNORMAL LOW (ref 3.87–5.11)
RDW: 17 % — AB (ref 11.5–15.5)
WBC: 6.5 10*3/uL (ref 4.0–10.5)

## 2015-11-28 LAB — IRON AND TIBC
%SAT: 3 % — ABNORMAL LOW (ref 11–50)
Iron: 16 ug/dL — ABNORMAL LOW (ref 45–160)
TIBC: 528 ug/dL — AB (ref 250–450)
UIBC: 512 ug/dL — AB (ref 125–400)

## 2015-11-28 LAB — FERRITIN: Ferritin: 6 ng/mL — ABNORMAL LOW (ref 10–232)

## 2015-12-05 ENCOUNTER — Telehealth: Payer: Self-pay | Admitting: Internal Medicine

## 2015-12-05 NOTE — Telephone Encounter (Signed)
Pt states her ferritin level keeps dropping and her PCP wanted her to follow up with GI due to history of ulcers and to r/o possible bleed. Pt scheduled to see Amy Esterwood PA 12/10/15@2 :30pm. Pt aware of appt.

## 2015-12-10 ENCOUNTER — Other Ambulatory Visit (INDEPENDENT_AMBULATORY_CARE_PROVIDER_SITE_OTHER): Payer: BLUE CROSS/BLUE SHIELD

## 2015-12-10 ENCOUNTER — Encounter: Payer: Self-pay | Admitting: Physician Assistant

## 2015-12-10 ENCOUNTER — Ambulatory Visit (INDEPENDENT_AMBULATORY_CARE_PROVIDER_SITE_OTHER): Payer: BLUE CROSS/BLUE SHIELD | Admitting: Physician Assistant

## 2015-12-10 VITALS — BP 112/68 | HR 76 | Ht 62.0 in | Wt 109.8 lb

## 2015-12-10 DIAGNOSIS — Z1211 Encounter for screening for malignant neoplasm of colon: Secondary | ICD-10-CM

## 2015-12-10 DIAGNOSIS — R1013 Epigastric pain: Secondary | ICD-10-CM

## 2015-12-10 DIAGNOSIS — R112 Nausea with vomiting, unspecified: Secondary | ICD-10-CM

## 2015-12-10 DIAGNOSIS — R634 Abnormal weight loss: Secondary | ICD-10-CM

## 2015-12-10 DIAGNOSIS — K279 Peptic ulcer, site unspecified, unspecified as acute or chronic, without hemorrhage or perforation: Secondary | ICD-10-CM

## 2015-12-10 DIAGNOSIS — R197 Diarrhea, unspecified: Secondary | ICD-10-CM

## 2015-12-10 DIAGNOSIS — Z86010 Personal history of colonic polyps: Secondary | ICD-10-CM

## 2015-12-10 LAB — BASIC METABOLIC PANEL
BUN: 20 mg/dL (ref 6–23)
CALCIUM: 9.1 mg/dL (ref 8.4–10.5)
CO2: 23 mEq/L (ref 19–32)
Chloride: 107 mEq/L (ref 96–112)
Creatinine, Ser: 0.69 mg/dL (ref 0.40–1.20)
GFR: 92.98 mL/min (ref >60.00)
GLUCOSE: 92 mg/dL (ref 70–99)
Potassium: 4.7 mEq/L (ref 3.5–5.1)
SODIUM: 139 meq/L (ref 135–145)

## 2015-12-10 MED ORDER — DICYCLOMINE HCL 10 MG PO CAPS: 10.0000 mg | ORAL_CAPSULE | Freq: Three times a day (TID) | ORAL | Status: DC

## 2015-12-10 MED ORDER — NA SULFATE-K SULFATE-MG SULF 17.5-3.13-1.6 GM/177ML PO SOLN: 1.0000 | Freq: Once | ORAL | Status: DC

## 2015-12-10 MED ORDER — INTEGRA PLUS PO CAPS: 1.0000 | ORAL_CAPSULE | Freq: Every day | ORAL | Status: DC

## 2015-12-10 NOTE — Progress Notes (Signed)
Patient ID: [name], female   DOB: [birth year], 60 y.o.   MRN: [id number]   Subjective:    Patient ID: [name], female    DOB: [birth year], 60 y.o.   MRN: [id number]  HPI  Kanishia   is a pleasant 60 year old white female known to Dr. Marina Goodell who is referred today by Dr. Lacretia Leigh office for for evaluation of abdominal pain and drifting hemoglobin and ferritin. Patient has history of COPD, hypertension, peripheral vascular disease status post carotid endarterectomy February 2016, she is status post cholecystectomy has history of depression and recurrent peptic ulcer disease. Patient had undergone an EGD in December 2015 with finding of a prepyloric ulcer clean-based and a duodenal bulb linear ulcer both ulcers measured 7-10 mm in size. After she was to have a follow-up EGD but did not come back says that was because she had to undergo the carotid endarterectomy. She also had colonoscopy in January 2013 for follow-up of adenomatous colon polyps and this was negative exam. Recommended to have 5 year interval follow-up.  Patient has history of iron deficiency anemia and says she has been off and on iron over the past few years. She does not tolerate oral iron well and says that it bothers her stomach a lot so she takes it for short period of time and then has to stop. She was recently started back on an over-the-counter iron supplement which she is taking about every other day. She does take a baby aspirin daily ,  denies any regular NSAID use. She says she has been having upper abdominal burning type pain over the past 4-6 weeks as well as diarrhea which is unusual for her. 3-4 bowel movements per day some urgency postprandially. Her appetite has been decreased she thinks because she doesn't want to have to run to the bathroom after eating. Has not noticed any melena or hematochezia. Has had a lot of nausea and sometimes she says when she is having pain and nausea she'll break out into a  cold sweat. There is some mild discomfort present constantly which is definitely exacerbated by eating. Says at times this is quite painful Reviewing her labs hemoglobin was 11.7 in the fall of 2000 12/04/2015 hemoglobin 10.7 hematocrit of 32.6 She has had a chronically low ferritin 1 year ago was 16 and now is 6. Most recently serum iron 16 TIBC of 528 and iron saturation of 3. Weight loss of 18-19 pounds over the past year Review of Systems Pertinent positive and negative review of systems were noted in the above HPI section.  All other review of systems was otherwise negative.  Outpatient Encounter Prescriptions as of 12/10/2015  Medication Sig  . acetaminophen (TYLENOL) 500 MG tablet Take 500 mg by mouth every 6 (six) hours as needed for moderate pain or headache.   . albuterol (PROVENTIL HFA;VENTOLIN HFA) 108 (90 BASE) MCG/ACT inhaler Inhale 2 puffs into the lungs every 6 (six) hours as needed for wheezing or shortness of breath.  Marland Kitchen alendronate (FOSAMAX) 70 MG tablet Take 70 mg by mouth every 7 (seven) days. Take with a full glass of water on an empty stomach.  Marland Kitchen aspirin EC 81 MG tablet Take 1 tablet (81 mg total) by mouth daily.  . cetirizine (ZYRTEC) 10 MG tablet Take 10 mg by mouth as needed for allergies.  . Cholecalciferol (VITAMIN D) 2000 UNITS CAPS Take 7000 units a day   . ezetimibe (ZETIA) 10 MG tablet Take 1 tablet (10 mg total) by mouth  daily.  . Fluticasone-Salmeterol (ADVAIR DISKUS) 250-50 MCG/DOSE AEPB Inhale 1 puff into the lungs every 12 (twelve) hours.  Marland Kitchen omeprazole (PRILOSEC) 40 MG capsule TAKE 1 CAPSULE (40 MG TOTAL) BY MOUTH 2 (TWO) TIMES DAILY.  Marland Kitchen dicyclomine (BENTYL) 10 MG capsule Take 1 capsule (10 mg total) by mouth 3 (three) times daily before meals.  . FeFum-FePoly-FA-B Cmp-C-Biot (INTEGRA PLUS) CAPS Take 1 capsule by mouth daily.  . Na Sulfate-K Sulfate-Mg Sulf SOLN Take 1 kit by mouth once.   No facility-administered encounter medications on file as of 12/10/2015.     Allergies  Allergen Reactions  . Celebrex [Celecoxib]     GI bleed  . Crestor [Rosuvastatin]     Made legs hurt  . Penicillins     REACTION: whelps  . Pravastatin     Muscle aches   Patient Active Problem List   Diagnosis Date Noted  . Osteoporosis 01/02/2015  . Depression, major, in remission (HCC) 01/02/2015  . Carotid stenosis, asymptomatic 12/16/2014  . HTN (hypertension) 10/03/2014  . Abnormal glucose 10/03/2014  . Medication management 10/03/2014  . COPD (chronic obstructive pulmonary disease) (HCC)   . Asthma   . Vitamin D deficiency   . COLONIC POLYPS 04/30/2008  . Mixed hyperlipidemia 04/30/2008  . Internal hemorrhoids 04/30/2008  . GERD 04/30/2008  . Gastric ulcer 04/30/2008  . Duodenal ulcer without hemorrhage or perforation and without obstruction 04/30/2008   Social History   Social History  . Marital Status: Married    Spouse Name: N/A  . Number of Children: 1  . Years of Education: N/A   Occupational History  . disability    Social History Main Topics  . Smoking status: Current Every Day Smoker -- 1.00 packs/day for 36 years    Types: Cigarettes    Last Attempt to Quit: 06/18/2014  . Smokeless tobacco: Never Used     Comment: stopped but started back. form given 12/10/15  . Alcohol Use: 0.0 oz/week    0 Standard drinks or equivalent per week     Comment: occasional  . Drug Use: No  . Sexual Activity: Not Currently   Other Topics Concern  . Not on file   Social History Narrative    Ms. Cortina's family history includes AAA (abdominal aortic aneurysm) in her brother and mother; Colon polyps in her mother; Diabetes in her maternal grandfather; Heart attack in her brother, father, and mother; Heart disease in her brother, father, maternal grandfather, maternal grandmother, and mother; Hyperlipidemia in her brother, father, and mother; Hypertension in her brother, father, and mother; Inflammatory bowel disease in her mother; Stroke in her mother;  Varicose Veins in her daughter and mother. There is no history of Esophageal cancer, Stomach cancer, or Rectal cancer.      Objective:    Filed Vitals:   12/10/15 1423  BP: 112/68  Pulse: 76    Physical Exam  well-developed older white female in no acute distress, pleasant blood pressure 112/68 pulse 76 height 5 foot 2 weight 109. HEENT; nontraumatic normocephalic EOMI PERRLA sclera anicteric, Cardiovascular ;regular rate and rhythm with S1-S2, Pulmonary; clear bilaterally with decreased breath sounds bases, Abdomen; soft basically nontender there is no palpable mass or hepatosplenomegaly bowel sounds are present no audible bruit, Rectal ;exam not done, Ext; no clubbing cyanosis or edema skin warm dry, Neuropsych; mood and affect appropriate     Assessment & Plan:   #1 60 yo female with iron deficiency anemia and drift in hgb past several months-  iron inadequately replaced #2 upper abdominal pain, worse post prandially, nausea, decreased appetite ,weight loss and diarrhea x 6 weeks Etiology not clear- pt with hx of recurrent PUD R/O malignancy R/O mesenteric insufficiency #3 Hx of gastric and duodenal ulcers #4 hx of adenomatous polyps- due for F/U 2018 #5 COPD #6 PVD -s/p CEA2016 #7 s/p GB  Plan; Continue  BID Prilosec  Add bentyl 10 mg 30 min ac Add Intergra plus one po daily Schedule for Ct abd/pelvis with contrast  Schedule for EGD and Colonoscopy with Dr Marina Goodell - procedures discussed in detail with pt  and she is agreeable to proceed.      Abraham Entwistle S Meliya Mcconahy PA-C 12/10/2015   Cc: [name], MD

## 2015-12-10 NOTE — Patient Instructions (Addendum)
You have been given a separate informational sheet regarding your tobacco use, the importance of quitting and local resources to help you quit. Please go to the basement level to have your labs drawn.  We sent prescriptions to [facility] . 1.  Integra Plus iron capsules 2. Bentyl  3. Suprep colonoscopy prep   You have been scheduled for a CT scan of the abdomen and pelvis at [facility] ([address]---this is in the same building as Press photographer).   You are scheduled on Monday 12-15-2015 at 2:30 PM . You should arrive 15 minutes prior to your appointment time for registration. Please follow the written instructions below on the day of your exam:  WARNING: IF YOU ARE ALLERGIC TO IODINE/X-RAY DYE, PLEASE NOTIFY RADIOLOGY IMMEDIATELY AT [phone]! YOU WILL BE GIVEN A 13 HOUR PREMEDICATION PREP.  1) Do not eat or drink anything after 10:30 am  (4 hours prior to your test) 2) You have been given 2 bottles of oral contrast to drink. The solution may taste better if refrigerated, but do NOT add ice or any other liquid to this solution. Shake well before drinking.    Drink 1 bottle of contrast @ 1:00 Pm  (2 hours prior to your exam)  Drink 1 bottle of contrast @ 2:00 PM  (1 hour prior to your exam)  You may take any medications as prescribed with a small amount of water except for the following: Metformin, Glucophage, Glucovance, Avandamet, Riomet, Fortamet, Actoplus Met, Janumet, Glumetza or Metaglip. The above medications must be held the day of the exam AND 48 hours after the exam.  The purpose of you drinking the oral contrast is to aid in the visualization of your intestinal tract. The contrast solution may cause some diarrhea. Before your exam is started, you will be given a small amount of fluid to drink. Depending on your individual set of symptoms, you may also receive an intravenous injection of x-ray contrast/dye. Plan on being at [facility] for 30  minutes or long, depending on the type of exam you are having performed.  If you have any questions regarding your exam or if you need to reschedule, you may call the CT department at [phone] between the hours of 8:00 am and 5:00 pm, Monday-Friday.  ________________________________________________________________________

## 2015-12-10 NOTE — Progress Notes (Signed)
Agree with initial assessment and extensive plans as outlined

## 2015-12-15 ENCOUNTER — Ambulatory Visit (INDEPENDENT_AMBULATORY_CARE_PROVIDER_SITE_OTHER)
Admission: RE | Admit: 2015-12-15 | Discharge: 2015-12-15 | Disposition: A | Discharge status: Home / Self Care | Payer: BLUE CROSS/BLUE SHIELD | Source: Ambulatory Visit | Attending: Internal Medicine | Admitting: Internal Medicine

## 2015-12-15 DIAGNOSIS — R112 Nausea with vomiting, unspecified: Secondary | ICD-10-CM | POA: Diagnosis not present

## 2015-12-15 DIAGNOSIS — R1013 Epigastric pain: Secondary | ICD-10-CM | POA: Diagnosis not present

## 2015-12-15 DIAGNOSIS — Z86010 Personal history of colonic polyps: Secondary | ICD-10-CM

## 2015-12-15 DIAGNOSIS — R634 Abnormal weight loss: Secondary | ICD-10-CM | POA: Diagnosis not present

## 2015-12-15 DIAGNOSIS — Z860101 Personal history of adenomatous and serrated colon polyps: Secondary | ICD-10-CM

## 2015-12-15 DIAGNOSIS — R197 Diarrhea, unspecified: Secondary | ICD-10-CM | POA: Diagnosis not present

## 2015-12-15 DIAGNOSIS — K279 Peptic ulcer, site unspecified, unspecified as acute or chronic, without hemorrhage or perforation: Secondary | ICD-10-CM

## 2015-12-15 DIAGNOSIS — Z1211 Encounter for screening for malignant neoplasm of colon: Secondary | ICD-10-CM

## 2015-12-15 MED ORDER — IOHEXOL 300 MG/ML  SOLN
100.0000 mL | Freq: Once | INTRAMUSCULAR | Status: AC | PRN
  Administered 2015-12-15: 100 mL via INTRAVENOUS

## 2015-12-26 ENCOUNTER — Other Ambulatory Visit: Payer: Self-pay | Admitting: *Deleted

## 2015-12-26 MED ORDER — OMEPRAZOLE 40 MG PO CPDR: DELAYED_RELEASE_CAPSULE | ORAL | Status: DC

## 2015-12-31 ENCOUNTER — Encounter: Payer: Self-pay | Admitting: Internal Medicine

## 2015-12-31 ENCOUNTER — Ambulatory Visit (AMBULATORY_SURGERY_CENTER): Payer: BLUE CROSS/BLUE SHIELD | Admitting: Internal Medicine

## 2015-12-31 VITALS — BP 126/65 | HR 81 | Temp 98.4°F | Resp 14 | Ht 62.0 in | Wt 109.0 lb

## 2015-12-31 DIAGNOSIS — R197 Diarrhea, unspecified: Secondary | ICD-10-CM

## 2015-12-31 DIAGNOSIS — K253 Acute gastric ulcer without hemorrhage or perforation: Secondary | ICD-10-CM | POA: Diagnosis not present

## 2015-12-31 DIAGNOSIS — K269 Duodenal ulcer, unspecified as acute or chronic, without hemorrhage or perforation: Secondary | ICD-10-CM | POA: Diagnosis not present

## 2015-12-31 DIAGNOSIS — K259 Gastric ulcer, unspecified as acute or chronic, without hemorrhage or perforation: Secondary | ICD-10-CM | POA: Diagnosis not present

## 2015-12-31 DIAGNOSIS — Z86010 Personal history of colonic polyps: Secondary | ICD-10-CM

## 2015-12-31 DIAGNOSIS — D509 Iron deficiency anemia, unspecified: Secondary | ICD-10-CM | POA: Diagnosis not present

## 2015-12-31 MED ORDER — SODIUM CHLORIDE 0.9 % IV SOLN: 500.0000 mL | INTRAVENOUS | Status: DC

## 2015-12-31 NOTE — Progress Notes (Signed)
A/ox3, pleased with MAC, report to  Karen RN 

## 2015-12-31 NOTE — Progress Notes (Signed)
Called to room to assist during endoscopic procedure.  Patient ID and intended procedure confirmed with present staff. Received instructions for my participation in the procedure from the performing physician.  

## 2015-12-31 NOTE — Op Note (Signed)
[facility] Patient Name: [name] Procedure Date: 12/31/2015 2:43 PM MRN: [id number] Endoscopist: [name]. Marina Goodell , MD Age: 60 Referring MD:  Date of Birth: 08-19-1956 Gender: Female Procedure:                Colonoscopy Indications:              Abdominal pain, Chronic diarrhea, Iron deficiency                            anemia, Abnormal CT of the GI tract Medicines:                Monitored Anesthesia Care Procedure:                Pre-Anesthesia Assessment:                           - Prior to the procedure, a History and Physical                            was performed, and patient medications and                            allergies were reviewed. The patient's tolerance of                            previous anesthesia was also reviewed. The risks                            and benefits of the procedure and the sedation                            options and risks were discussed with the patient.                            All questions were answered, and informed consent                            was obtained. Prior Anticoagulants: The patient has                            taken no previous anticoagulant or antiplatelet                            agents. ASA Grade Assessment: III - A patient with                            severe systemic disease. After reviewing the risks                            and benefits, the patient was deemed in                            satisfactory condition to undergo the procedure.  After obtaining informed consent, the colonoscope                            was passed under direct vision. Throughout the                            procedure, the patient's blood pressure, pulse, and                            oxygen saturations were monitored continuously. The                            Model CF-HQ190L [id number]) scope was introduced                            through the anus and advanced to the the  cecum,                            identified by appendiceal orifice and ileocecal                            valve. The colonoscopy was performed without                            difficulty. The patient tolerated the procedure                            well. The quality of the bowel preparation was                            excellent. The bowel preparation used was SUPREP.                            The terminal ileum, ileocecal valve, appendiceal                            orifice, and rectum were photographed. Scope In: 3:00:20 PM Scope Out: 3:12:29 PM Scope Withdrawal Time: 0 hours 8 minutes 50 seconds  Total Procedure Duration: 0 hours 12 minutes 9 seconds  Findings:      The perianal and digital rectal examinations were normal.      The terminal ileum appeared normal.      The entire examined colon appeared normal on direct and retroflexion       views.      Biopsies for histology were taken with a cold forceps from the entire       colon for evaluation of microscopic colitis. Complications:            No immediate complications. Estimated Blood Loss:     Estimated blood loss: none. Impression:               - The examined portion of the ileum was normal.                           - The entire examined colon is normal on direct and  retroflexion views.                           - No specimens collected. Recommendation:           - Patient has a contact number available for                            emergencies. The signs and symptoms of potential                            delayed complications were discussed with the                            patient. Return to normal activities tomorrow.                            Written discharge instructions were provided to the                            patient.                           - Resume previous diet.                           - Continue present medications.                           - Await  pathology results.                           - Repeat colonoscopy in 10 years for screening                            purposes. Procedure Code(s):        --- Professional ---                           [phone], Colonoscopy, flexible; with biopsy, single                            or multiple CPT copyright 2016 [employer]. All rights reserved. [name]. Marina Goodell, MD [name]. Marina Goodell, MD 12/31/2015 3:24:09 PM This report has been signed electronically. Number of Addenda: 0

## 2015-12-31 NOTE — Op Note (Signed)
[name] Patient Name: [name] Procedure Date: 12/31/2015 2:44 PM MRN: [id number] Endoscopist: [name]. Marina Goodell , MD Age: 60 Referring MD:  Date of Birth: November 27, 1955 Gender: Female Procedure:                Upper GI endoscopy Indications:              Epigastric abdominal pain, Iron deficiency anemia Medicines:                Monitored Anesthesia Care Procedure:                Pre-Anesthesia Assessment:                           - Prior to the procedure, a History and Physical                            was performed, and patient medications and                            allergies were reviewed. The patient's tolerance of                            previous anesthesia was also reviewed. The risks                            and benefits of the procedure and the sedation                            options and risks were discussed with the patient.                            All questions were answered, and informed consent                            was obtained. Prior Anticoagulants: The patient has                            taken no previous anticoagulant or antiplatelet                            agents. ASA Grade Assessment: III - A patient with                            severe systemic disease. After reviewing the risks                            and benefits, the patient was deemed in                            satisfactory condition to undergo the procedure.                           - Prior to the procedure, a History and Physical  was performed, and patient medications and                            allergies were reviewed. The patient's tolerance of                            previous anesthesia was also reviewed. The risks                            and benefits of the procedure and the sedation                            options and risks were discussed with the patient.                            All questions were answered,  and informed consent                            was obtained. Prior Anticoagulants: The patient has                            taken no previous anticoagulant or antiplatelet                            agents. ASA Grade Assessment: III - A patient with                            severe systemic disease. After reviewing the risks                            and benefits, the patient was deemed in                            satisfactory condition to undergo the procedure.                           After obtaining informed consent, the endoscope was                            passed under direct vision. Throughout the                            procedure, the patient's blood pressure, pulse, and                            oxygen saturations were monitored continuously. The                            Model GIF-HQ190 [id number]) scope was introduced                            through the mouth, and advanced to the third part  of duodenum. The upper GI endoscopy was                            accomplished without difficulty. The patient                            tolerated the procedure well. Scope In: Scope Out: Findings:      The esophagus was normal throughout. The proximal stomach was normal       except for a hiatal hernia. There was scarring from old ulcer disease as       well as the pyloric ulcers measuring up to 8 mm. No stigmata. Was a 12       mm duodenal bulb ulcer. The post bulbar duodenum was normal. Biopsies       were taken for CLO. Post bulbar duodenal biopsies taken as well.       Biopsies for histology were taken with a cold forceps for evaluation of       celiac disease. Biopsies were taken with a cold forceps for Helicobacter       pylori testing. Complications:            No immediate complications. Estimated Blood Loss:     Estimated blood loss: none. Impression:               1. Multiple gastric and duodenal ulcers                           2.  Status post CLO biopsy                           3. Status post biopsies for celiac Recommendation:           - Patient has a contact number available for                            emergencies. The signs and symptoms of potential                            delayed complications were discussed with the                            patient. Return to normal activities tomorrow.                            Written discharge instructions were provided to the                            patient.                           - Resume previous diet.                           - Continue present medications.                           - Await pathology results.                           -  Return to my office in 6 weeks. Procedure Code(s):        --- Professional ---                           [phone], Esophagogastroduodenoscopy, flexible,                            transoral; with biopsy, single or multiple CPT copyright 2016 [employer]. All rights reserved. [name]. Marina Goodell, MD [name]. Marina Goodell, MD 12/31/2015 3:32:34 PM This report has been signed electronically. Number of Addenda: 0

## 2015-12-31 NOTE — Patient Instructions (Addendum)
Return to Dr. Blanch Media office in 6 weeks. [phone])  Handout given : Peptic Ulcer Disease.    YOU HAD AN ENDOSCOPIC PROCEDURE TODAY AT [facility]:   Refer to the procedure report that was given to you for any specific questions about what was found during the examination.  If the procedure report does not answer your questions, please call your gastroenterologist to clarify.  If you requested that your care partner not be given the details of your procedure findings, then the procedure report has been included in a sealed envelope for you to review at your convenience later.  YOU SHOULD EXPECT: Some feelings of bloating in the abdomen. Passage of more gas than usual.  Walking can help get rid of the air that was put into your GI tract during the procedure and reduce the bloating. If you had a lower endoscopy (such as a colonoscopy or flexible sigmoidoscopy) you may notice spotting of blood in your stool or on the toilet paper. If you underwent a bowel prep for your procedure, you may not have a normal bowel movement for a few days.  Please Note:  You might notice some irritation and congestion in your nose or some drainage.  This is from the oxygen used during your procedure.  There is no need for concern and it should clear up in a day or so.  SYMPTOMS TO REPORT IMMEDIATELY:   Following lower endoscopy (colonoscopy or flexible sigmoidoscopy):  Excessive amounts of blood in the stool  Significant tenderness or worsening of abdominal pains  Swelling of the abdomen that is new, acute  Fever of 100F or higher   Following upper endoscopy (EGD)  Vomiting of blood or coffee ground material  New chest pain or pain under the shoulder blades  Painful or persistently difficult swallowing  New shortness of breath  Fever of 100F or higher  Black, tarry-looking stools  For urgent or emergent issues, a gastroenterologist can be reached at any hour by calling [phone]  [phone].   DIET: Your first meal following the procedure should be a small meal and then it is ok to progress to your normal diet. Heavy or fried foods are harder to digest and may make you feel nauseous or bloated.  Likewise, meals heavy in dairy and vegetables can increase bloating.  Drink plenty of fluids but you should avoid alcoholic beverages for 24 hours.  ACTIVITY:  You should plan to take it easy for the rest of today and you should NOT DRIVE or use heavy machinery until tomorrow (because of the sedation medicines used during the test).    FOLLOW UP: Our staff will call the number listed on your records the next business day following your procedure to check on you and address any questions or concerns that you may have regarding the information given to you following your procedure. If we do not reach you, we will leave a message.  However, if you are feeling well and you are not experiencing any problems, there is no need to return our call.  We will assume that you have returned to your regular daily activities without incident.  If any biopsies were taken you will be contacted by phone or by letter within the next 1-3 weeks.  Please call us at [phone] if you have not heard about the biopsies in 3 weeks.    SIGNATURES/CONFIDENTIALITY: You and/or your care partner have signed paperwork which will be entered into your electronic medical record.  These signatures attest to the fact that that the information above on your After Visit Summary has been reviewed and is understood.  Full responsibility of the confidentiality of this discharge information lies with you and/or your care-partner.

## 2016-01-01 ENCOUNTER — Telehealth: Payer: Self-pay

## 2016-01-01 LAB — HELICOBACTER PYLORI SCREEN-BIOPSY: UREASE: NEGATIVE

## 2016-01-01 NOTE — Telephone Encounter (Signed)
  Follow up Call-  Call back number 12/31/2015 09/20/2014  Post procedure Call Back phone  # [phone]  Permission to leave phone message Yes Yes     Patient questions:  Do you have a fever, pain , or abdominal swelling? No. Pain Score  0 *  Have you tolerated food without any problems? Yes.    Have you been able to return to your normal activities? Yes.    Do you have any questions about your discharge instructions: Diet   No. Medications  No. Follow up visit  No.  Do you have questions or concerns about your Care? No.  Actions: * If pain score is 4 or above: No action needed, pain <4.

## 2016-01-09 ENCOUNTER — Encounter: Payer: Self-pay | Admitting: Family

## 2016-01-11 ENCOUNTER — Encounter: Payer: Self-pay | Admitting: Internal Medicine

## 2016-01-20 ENCOUNTER — Encounter: Payer: Self-pay | Admitting: Family

## 2016-01-20 ENCOUNTER — Ambulatory Visit: Payer: BLUE CROSS/BLUE SHIELD | Admitting: Family

## 2016-01-20 ENCOUNTER — Ambulatory Visit (HOSPITAL_COMMUNITY): Payer: BLUE CROSS/BLUE SHIELD

## 2016-01-21 ENCOUNTER — Encounter: Payer: Self-pay | Admitting: Physician Assistant

## 2016-01-29 ENCOUNTER — Ambulatory Visit: Payer: BLUE CROSS/BLUE SHIELD | Admitting: Family

## 2016-01-29 ENCOUNTER — Ambulatory Visit (HOSPITAL_COMMUNITY): Payer: BLUE CROSS/BLUE SHIELD

## 2016-02-03 ENCOUNTER — Ambulatory Visit (HOSPITAL_COMMUNITY)
Admission: RE | Admit: 2016-02-03 | Payer: BLUE CROSS/BLUE SHIELD | Source: Ambulatory Visit | Attending: Vascular Surgery | Admitting: Vascular Surgery

## 2016-02-10 ENCOUNTER — Ambulatory Visit: Payer: BLUE CROSS/BLUE SHIELD | Admitting: Family

## 2016-02-11 ENCOUNTER — Encounter: Payer: Self-pay | Admitting: Physician Assistant

## 2016-02-13 ENCOUNTER — Encounter: Payer: Self-pay | Admitting: Family

## 2016-02-16 ENCOUNTER — Ambulatory Visit: Payer: BLUE CROSS/BLUE SHIELD | Admitting: Internal Medicine

## 2016-02-17 DIAGNOSIS — H18411 Arcus senilis, right eye: Secondary | ICD-10-CM | POA: Diagnosis not present

## 2016-02-17 DIAGNOSIS — H2512 Age-related nuclear cataract, left eye: Secondary | ICD-10-CM | POA: Diagnosis not present

## 2016-02-17 DIAGNOSIS — H2511 Age-related nuclear cataract, right eye: Secondary | ICD-10-CM | POA: Diagnosis not present

## 2016-02-17 DIAGNOSIS — H18412 Arcus senilis, left eye: Secondary | ICD-10-CM | POA: Diagnosis not present

## 2016-02-18 ENCOUNTER — Ambulatory Visit (HOSPITAL_COMMUNITY)
Admission: RE | Admit: 2016-02-18 | Discharge: 2016-02-18 | Disposition: A | Discharge status: Home / Self Care | Payer: BLUE CROSS/BLUE SHIELD | Source: Ambulatory Visit | Attending: Family | Admitting: Family

## 2016-02-18 ENCOUNTER — Ambulatory Visit (INDEPENDENT_AMBULATORY_CARE_PROVIDER_SITE_OTHER): Payer: BLUE CROSS/BLUE SHIELD | Admitting: Family

## 2016-02-18 ENCOUNTER — Encounter: Payer: Self-pay | Admitting: Family

## 2016-02-18 VITALS — BP 109/65 | HR 77 | Ht 62.0 in | Wt 106.5 lb

## 2016-02-18 DIAGNOSIS — I6521 Occlusion and stenosis of right carotid artery: Secondary | ICD-10-CM

## 2016-02-18 DIAGNOSIS — Z48812 Encounter for surgical aftercare following surgery on the circulatory system: Secondary | ICD-10-CM | POA: Diagnosis not present

## 2016-02-18 DIAGNOSIS — Z9889 Other specified postprocedural states: Secondary | ICD-10-CM | POA: Diagnosis not present

## 2016-02-18 DIAGNOSIS — I6522 Occlusion and stenosis of left carotid artery: Secondary | ICD-10-CM | POA: Insufficient documentation

## 2016-02-18 DIAGNOSIS — I10 Essential (primary) hypertension: Secondary | ICD-10-CM | POA: Insufficient documentation

## 2016-02-18 DIAGNOSIS — E785 Hyperlipidemia, unspecified: Secondary | ICD-10-CM | POA: Diagnosis not present

## 2016-02-18 DIAGNOSIS — I6523 Occlusion and stenosis of bilateral carotid arteries: Secondary | ICD-10-CM

## 2016-02-18 DIAGNOSIS — K219 Gastro-esophageal reflux disease without esophagitis: Secondary | ICD-10-CM | POA: Diagnosis not present

## 2016-02-18 NOTE — Patient Instructions (Addendum)
Stroke Prevention Some medical conditions and behaviors are associated with an increased chance of having a stroke. You may prevent a stroke by making healthy choices and managing medical conditions. HOW CAN I REDUCE MY RISK OF HAVING A STROKE?   Stay physically active. Get at least 30 minutes of activity on most or all days.  Do not smoke. It may also be helpful to avoid exposure to secondhand smoke.  Limit alcohol use. Moderate alcohol use is considered to be:  No more than 2 drinks per day for men.  No more than 1 drink per day for nonpregnant women.  Eat healthy foods. This involves:  Eating 5 or more servings of fruits and vegetables a day.  Making dietary changes that address high blood pressure (hypertension), high cholesterol, diabetes, or obesity.  Manage your cholesterol levels.  Making food choices that are high in fiber and low in saturated fat, trans fat, and cholesterol may control cholesterol levels.  Take any prescribed medicines to control cholesterol as directed by your health care provider.  Manage your diabetes.  Controlling your carbohydrate and sugar intake is recommended to manage diabetes.  Take any prescribed medicines to control diabetes as directed by your health care provider.  Control your hypertension.  Making food choices that are low in salt (sodium), saturated fat, trans fat, and cholesterol is recommended to manage hypertension.  Ask your health care provider if you need treatment to lower your blood pressure. Take any prescribed medicines to control hypertension as directed by your health care provider.  If you are 18-39 years of age, have your blood pressure checked every 3-5 years. If you are 40 years of age or older, have your blood pressure checked every year.  Maintain a healthy weight.  Reducing calorie intake and making food choices that are low in sodium, saturated fat, trans fat, and cholesterol are recommended to manage  weight.  Stop drug abuse.  Avoid taking birth control pills.  Talk to your health care provider about the risks of taking birth control pills if you are over 35 years old, smoke, get migraines, or have ever had a blood clot.  Get evaluated for sleep disorders (sleep apnea).  Talk to your health care provider about getting a sleep evaluation if you snore a lot or have excessive sleepiness.  Take medicines only as directed by your health care provider.  For some people, aspirin or blood thinners (anticoagulants) are helpful in reducing the risk of forming abnormal blood clots that can lead to stroke. If you have the irregular heart rhythm of atrial fibrillation, you should be on a blood thinner unless there is a good reason you cannot take them.  Understand all your medicine instructions.  Make sure that other conditions (such as anemia or atherosclerosis) are addressed. SEEK IMMEDIATE MEDICAL CARE IF:   You have sudden weakness or numbness of the face, arm, or leg, especially on one side of the body.  Your face or eyelid droops to one side.  You have sudden confusion.  You have trouble speaking (aphasia) or understanding.  You have sudden trouble seeing in one or both eyes.  You have sudden trouble walking.  You have dizziness.  You have a loss of balance or coordination.  You have a sudden, severe headache with no known cause.  You have new chest pain or an irregular heartbeat. Any of these symptoms may represent a serious problem that is an emergency. Do not wait to see if the symptoms will   go away. Get medical help at once. Call your local emergency services (911 in [place] Do not drive yourself to the hospital.   This information is not intended to replace advice given to you by your health care provider. Make sure you discuss any questions you have with your health care provider.   Document Released: 11/11/2004 Document Revised: 10/25/2014 Document Reviewed:  04/06/2013 Elsevier Interactive Patient Education 2016 Elsevier Inc.     Steps to Quit Smoking  Smoking tobacco can be harmful to your health and can affect almost every organ in your body. Smoking puts you, and those around you, at risk for developing many serious chronic diseases. Quitting smoking is difficult, but it is one of the best things that you can do for your health. It is never too late to quit. WHAT ARE THE BENEFITS OF QUITTING SMOKING? When you quit smoking, you lower your risk of developing serious diseases and conditions, such as:  Lung cancer or lung disease, such as COPD.  Heart disease.  Stroke.  Heart attack.  Infertility.  Osteoporosis and bone fractures. Additionally, symptoms such as coughing, wheezing, and shortness of breath may get better when you quit. You may also find that you get sick less often because your body is stronger at fighting off colds and infections. If you are pregnant, quitting smoking can help to reduce your chances of having a baby of low birth weight. HOW DO I GET READY TO QUIT? When you decide to quit smoking, create a plan to make sure that you are successful. Before you quit:  Pick a date to quit. Set a date within the next two weeks to give you time to prepare.  Write down the reasons why you are quitting. Keep this list in places where you will see it often, such as on your bathroom mirror or in your car or wallet.  Identify the people, places, things, and activities that make you want to smoke (triggers) and avoid them. Make sure to take these actions:  Throw away all cigarettes at home, at work, and in your car.  Throw away smoking accessories, such as ashtrays and lighters.  Clean your car and make sure to empty the ashtray.  Clean your home, including curtains and carpets.  Tell your family, friends, and coworkers that you are quitting. Support from your loved ones can make quitting easier.  Talk with your health care  provider about your options for quitting smoking.  Find out what treatment options are covered by your health insurance. WHAT STRATEGIES CAN I USE TO QUIT SMOKING?  Talk with your healthcare provider about different strategies to quit smoking. Some strategies include:  Quitting smoking altogether instead of gradually lessening how much you smoke over a period of time. Research shows that quitting "cold turkey" is more successful than gradually quitting.  Attending in-person counseling to help you build problem-solving skills. You are more likely to have success in quitting if you attend several counseling sessions. Even short sessions of 10 minutes can be effective.  Finding resources and support systems that can help you to quit smoking and remain smoke-free after you quit. These resources are most helpful when you use them often. They can include:  Online chats with a counselor.  Telephone quitlines.  Printed self-help materials.  Support groups or group counseling.  Text messaging programs.  Mobile phone applications.  Taking medicines to help you quit smoking. (If you are pregnant or breastfeeding, talk with your health care provider first.) Some   medicines contain nicotine and some do not. Both types of medicines help with cravings, but the medicines that include nicotine help to relieve withdrawal symptoms. Your health care provider may recommend:  Nicotine patches, gum, or lozenges.  Nicotine inhalers or sprays.  Non-nicotine medicine that is taken by mouth. Talk with your health care provider about combining strategies, such as taking medicines while you are also receiving in-person counseling. Using these two strategies together makes you more likely to succeed in quitting than if you used either strategy on its own. If you are pregnant or breastfeeding, talk with your health care provider about finding counseling or other support strategies to quit smoking. Do not take  medicine to help you quit smoking unless told to do so by your health care provider. WHAT THINGS CAN I DO TO MAKE IT EASIER TO QUIT? Quitting smoking might feel overwhelming at first, but there is a lot that you can do to make it easier. Take these important actions:  Reach out to your family and friends and ask that they support and encourage you during this time. Call telephone quitlines, reach out to support groups, or work with a counselor for support.  Ask people who smoke to avoid smoking around you.  Avoid places that trigger you to smoke, such as bars, parties, or smoke-break areas at work.  Spend time around people who do not smoke.  Lessen stress in your life, because stress can be a smoking trigger for some people. To lessen stress, try:  Exercising regularly.  Deep-breathing exercises.  Yoga.  Meditating.  Performing a body scan. This involves closing your eyes, scanning your body from head to toe, and noticing which parts of your body are particularly tense. Purposefully relax the muscles in those areas.  Download or purchase mobile phone or tablet apps (applications) that can help you stick to your quit plan by providing reminders, tips, and encouragement. There are many free apps, such as QuitGuide from the CDC (Centers for Disease Control and Prevention). You can find other support for quitting smoking (smoking cessation) through smokefree.gov and other websites. HOW WILL I FEEL WHEN I QUIT SMOKING? Within the first 24 hours of quitting smoking, you may start to feel some withdrawal symptoms. These symptoms are usually most noticeable 2-3 days after quitting, but they usually do not last beyond 2-3 weeks. Changes or symptoms that you might experience include:  Mood swings.  Restlessness, anxiety, or irritation.  Difficulty concentrating.  Dizziness.  Strong cravings for sugary foods in addition to nicotine.  Mild weight  gain.  Constipation.  Nausea.  Coughing or a sore throat.  Changes in how your medicines work in your body.  A depressed mood.  Difficulty sleeping (insomnia). After the first 2-3 weeks of quitting, you may start to notice more positive results, such as:  Improved sense of smell and taste.  Decreased coughing and sore throat.  Slower heart rate.  Lower blood pressure.  Clearer skin.  The ability to breathe more easily.  Fewer sick days. Quitting smoking is very challenging for most people. Do not get discouraged if you are not successful the first time. Some people need to make many attempts to quit before they achieve long-term success. Do your best to stick to your quit plan, and talk with your health care provider if you have any questions or concerns.   This information is not intended to replace advice given to you by your health care provider. Make sure you discuss any questions   you have with your health care provider.   Document Released: 09/28/2001 Document Revised: 02/18/2015 Document Reviewed: 02/18/2015 Elsevier Interactive Patient Education 2016 [employer] Inc.  

## 2016-02-18 NOTE — Progress Notes (Signed)
Chief Complaint: Follow up Extracranial Carotid Artery Stenosis   History of Present Illness  [name] is a 60 y.o. female patient of Dr. Donnetta Hutching who is s/p right carotid endarterectomy for severe asymptomatic disease on 2/29 2016. She returns today for follow up.  The patient denies any history of TIA or stroke symptoms, specifically the patient denies a history of amaurosis fugax or monocular blindness, denies a history unilateral  of facial drooping, denies a history of hemiplegia, and denies a history of receptive or expressive aphasia.    She states she is anemic and takes iron tablets, feels weak which she attributes to anemia. She likes to walk, is limited by generalized weakness. She feels that she is gradually improving with po iron and vitamin C supplementation.  Pt denies and cardiac problems.  Pt Diabetic: no Pt smoker: smoker  (about 1 cigarette every 1-2 months, started in her teens)  Pt meds include: Statin : no, causes leg myalgias ASA: yes Other anticoagulants/antiplatelets: no   Past Medical History  Diagnosis Date  . GERD (gastroesophageal reflux disease)   . Duodenal ulcer   . Internal hemorrhoids   . Hx of colonic polyps   . Arthritis   . Hyperlipemia   . Family history of malignant neoplasm of gastrointestinal tract   . COPD (chronic obstructive pulmonary disease) ([place])   . Asthma   . Vitamin D deficiency   . Allergy   . Anemia   . Blood transfusion without reported diagnosis   . Cataract   . Osteoporosis   . HTN (hypertension) 10/03/2014  . Peripheral vascular disease ([place])   . Shortness of breath dyspnea     with ambulation  . Pneumonia     hx of  . Hepatitis     had in 6th grade pt unsure which kind  . Emphysema of lung ([place])   . Hiatal hernia     Social History Social History  Substance Use Topics  . Smoking status: Current Some Day Smoker -- 0.25 packs/day for 36 years    Types: Cigarettes    Last Attempt to Quit:  06/18/2014  . Smokeless tobacco: Never Used     Comment: 1-2 sometimes not daily  . Alcohol Use: 0.0 oz/week    0 Standard drinks or equivalent per week     Comment: occasional    Family History Family History  Problem Relation Age of Onset  . Colon polyps Mother   . Inflammatory bowel disease Mother   . Hypertension Mother   . Stroke Mother   . Heart disease Mother   . Hyperlipidemia Mother   . Varicose Veins Mother   . Heart attack Mother   . AAA (abdominal aortic aneurysm) Mother   . Diabetes Maternal Grandfather   . Heart disease Maternal Grandfather   . Heart disease Maternal Grandmother   . Colon cancer      mat great aunt  . Hypertension Father   . Hyperlipidemia Father   . Heart disease Father   . Heart attack Father   . Heart disease Brother   . Hyperlipidemia Brother   . Hypertension Brother   . Heart attack Brother   . AAA (abdominal aortic aneurysm) Brother   . Esophageal cancer Neg Hx   . Stomach cancer Neg Hx   . Rectal cancer Neg Hx   . Varicose Veins Daughter     Surgical History Past Surgical History  Procedure Laterality Date  . Abdominal hysterectomy    .  Appendectomy    . Total hip arthroplasty Right   . Lumbar disc surgery    . Elbow surgery Right   . Wrist surgery Left   . Hand surgery Left   . Esophagogastroduodenoscopy  10/2008  . Colonoscopy    . Upper gastrointestinal endoscopy    . Trigeminal neuralgia surgery    . Endarterectomy Right 12/16/2014    Procedure: ENDARTERECTOMY CAROTID with Dacron patch;  Surgeon: Rosetta Posner, MD;  Location: [facility];  Service: Vascular;  Laterality: Right;    Allergies  Allergen Reactions  . Celebrex [Celecoxib]     GI bleed  . Crestor [Rosuvastatin]     Made legs hurt  . Penicillins     REACTION: whelps  . Pravastatin     Muscle aches    Current Outpatient Prescriptions  Medication Sig Dispense Refill  . acetaminophen (TYLENOL) 500 MG tablet Take 500 mg by mouth every 6 (six) hours as  needed for moderate pain or headache.     . albuterol (PROVENTIL HFA;VENTOLIN HFA) 108 (90 BASE) MCG/ACT inhaler Inhale 2 puffs into the lungs every 6 (six) hours as needed for wheezing or shortness of breath. 1 Inhaler 2  . alendronate (FOSAMAX) 70 MG tablet Take 70 mg by mouth every 7 (seven) days. Take with a full glass of water on an empty stomach.    Marland Kitchen aspirin EC 81 MG tablet Take 1 tablet (81 mg total) by mouth daily. 30 tablet 0  . cetirizine (ZYRTEC) 10 MG tablet Take 10 mg by mouth as needed for allergies.    . Cholecalciferol (VITAMIN D) 2000 UNITS CAPS Take 7000 units a day     . dicyclomine (BENTYL) 10 MG capsule Take 1 capsule (10 mg total) by mouth 3 (three) times daily before meals. 90 capsule 4  . ezetimibe (ZETIA) 10 MG tablet Take 1 tablet (10 mg total) by mouth daily. 30 tablet 5  . FeFum-FePoly-FA-B Cmp-C-Biot (INTEGRA PLUS) CAPS Take 1 capsule by mouth daily. 30 capsule 4  . Fluticasone-Salmeterol (ADVAIR DISKUS) 250-50 MCG/DOSE AEPB Inhale 1 puff into the lungs every 12 (twelve) hours.    Marland Kitchen omeprazole (PRILOSEC) 40 MG capsule TAKE 1 CAPSULE (40 MG TOTAL) BY MOUTH 2 (TWO) TIMES DAILY. 180 capsule 1   No current facility-administered medications for this visit.    Review of Systems : See HPI for pertinent positives and negatives.  Physical Examination  Filed Vitals:   02/18/16 1058 02/18/16 1059  BP: 105/57 109/65  Pulse: 77   Height: 5\' 2"  (1.575 m)   Weight: 106 lb 8 oz (48.308 kg)   SpO2: 96%    Body mass index is 19.47 kg/(m^2).  General: WDWN thin female in NAD GAIT: normal Eyes: PERRLA Pulmonary:  Non-labored respirations, CTAB Cardiac: regular rhythm, no detected murmur.  VASCULAR EXAM Carotid Bruits Right Left   Negative Negative    Aorta is not palpable. Radial pulses are 2+ palpable right and 1+ palpable left  LE Pulses Right  Left       POPLITEAL  not palpable   not palpable       POSTERIOR TIBIAL  not palpable   not palpable        DORSALIS PEDIS      ANTERIOR TIBIAL 1+ palpable  1+ palpable     Gastrointestinal: soft, nontender, BS WNL, no r/g,  no palpable masses.  Musculoskeletal: no muscle atrophy/wasting. M/S 5/5 throughout, extremities without ischemic changes.  Neurologic: A&O X 3; Appropriate Affect, Speech is normal CN 2-12 intact, pain and light touch intact in extremities, Motor exam as listed above.   Non-Invasive Vascular Imaging CAROTID DUPLEX 02/18/2016   Right ICA: CEA site with no restenosis. Left ICA: 1 - 39 % stenosis. No significant change from prior exam on 07/15/15  Assessment: [name] is a 60 y.o. female who is s/p right carotid endarterectomy for severe asymptomatic disease on 2/29 2016. She has no history of stroke or TIA. Today's carotid duplex suggests right ICA (CEA) site with no restenosis. Left ICA: 1 - 39 % stenosis. No significant change from prior exam on 07/15/15.  Her atherosclerotic risk factors include rare smoking, she mostly quit in 2015.  Plan:  The patient was counseled re smoking cessation and given several free resources re smoking cessation.   Follow-up in 1 year with Carotid Duplex scan.   I discussed in depth with the patient the nature of atherosclerosis, and emphasized the importance of maximal medical management including strict control of blood pressure, blood glucose, and lipid levels, obtaining regular exercise, and cessation of smoking.  The patient is aware that without maximal medical management the underlying atherosclerotic disease process will progress, limiting the benefit of any interventions. The patient was given information about stroke prevention and what symptoms should prompt the patient to seek immediate medical care. Thank you for allowing [place] to participate in this patient's care.  Clemon Chambers, RN, MSN,  FNP-C [facility] Office: [phone]  Clinic Physician: Scot Dock  02/18/2016 11:05 AM

## 2016-02-25 ENCOUNTER — Encounter: Payer: Self-pay | Admitting: Physician Assistant

## 2016-03-03 ENCOUNTER — Encounter: Payer: Self-pay | Admitting: Physician Assistant

## 2016-03-08 DIAGNOSIS — H2511 Age-related nuclear cataract, right eye: Secondary | ICD-10-CM | POA: Diagnosis not present

## 2016-03-08 DIAGNOSIS — H25811 Combined forms of age-related cataract, right eye: Secondary | ICD-10-CM | POA: Diagnosis not present

## 2016-03-09 DIAGNOSIS — H2512 Age-related nuclear cataract, left eye: Secondary | ICD-10-CM | POA: Diagnosis not present

## 2016-03-22 DIAGNOSIS — H25812 Combined forms of age-related cataract, left eye: Secondary | ICD-10-CM | POA: Diagnosis not present

## 2016-03-22 DIAGNOSIS — H2512 Age-related nuclear cataract, left eye: Secondary | ICD-10-CM | POA: Diagnosis not present

## 2016-03-23 ENCOUNTER — Ambulatory Visit: Payer: BLUE CROSS/BLUE SHIELD | Admitting: Internal Medicine

## 2016-03-24 NOTE — Addendum Note (Signed)
Addended by: Thresa Ross C on: 03/24/2016 11:47 AM   Modules accepted: Orders

## 2016-04-13 ENCOUNTER — Encounter: Payer: Self-pay | Admitting: Physician Assistant

## 2016-05-18 ENCOUNTER — Other Ambulatory Visit: Payer: Self-pay

## 2016-05-18 DIAGNOSIS — R9389 Abnormal findings on diagnostic imaging of other specified body structures: Secondary | ICD-10-CM

## 2016-05-18 DIAGNOSIS — Z01812 Encounter for preprocedural laboratory examination: Secondary | ICD-10-CM

## 2016-05-19 ENCOUNTER — Encounter: Payer: Self-pay | Admitting: *Deleted

## 2016-05-20 ENCOUNTER — Other Ambulatory Visit: Payer: Self-pay

## 2016-06-21 ENCOUNTER — Other Ambulatory Visit: Payer: Self-pay | Admitting: Internal Medicine

## 2016-06-23 ENCOUNTER — Encounter: Payer: Self-pay | Admitting: Physician Assistant

## 2016-06-23 ENCOUNTER — Ambulatory Visit (INDEPENDENT_AMBULATORY_CARE_PROVIDER_SITE_OTHER): Payer: BLUE CROSS/BLUE SHIELD | Admitting: Physician Assistant

## 2016-06-23 VITALS — BP 110/60 | HR 94 | Temp 97.6°F | Resp 16 | Ht 62.0 in | Wt 98.4 lb

## 2016-06-23 DIAGNOSIS — Z79899 Other long term (current) drug therapy: Secondary | ICD-10-CM

## 2016-06-23 DIAGNOSIS — E559 Vitamin D deficiency, unspecified: Secondary | ICD-10-CM

## 2016-06-23 DIAGNOSIS — I10 Essential (primary) hypertension: Secondary | ICD-10-CM | POA: Diagnosis not present

## 2016-06-23 DIAGNOSIS — Z87891 Personal history of nicotine dependence: Secondary | ICD-10-CM

## 2016-06-23 DIAGNOSIS — K648 Other hemorrhoids: Secondary | ICD-10-CM

## 2016-06-23 DIAGNOSIS — E440 Moderate protein-calorie malnutrition: Secondary | ICD-10-CM | POA: Insufficient documentation

## 2016-06-23 DIAGNOSIS — E782 Mixed hyperlipidemia: Secondary | ICD-10-CM

## 2016-06-23 DIAGNOSIS — Z0001 Encounter for general adult medical examination with abnormal findings: Secondary | ICD-10-CM

## 2016-06-23 DIAGNOSIS — E441 Mild protein-calorie malnutrition: Secondary | ICD-10-CM

## 2016-06-23 DIAGNOSIS — K253 Acute gastric ulcer without hemorrhage or perforation: Secondary | ICD-10-CM

## 2016-06-23 DIAGNOSIS — Z0000 Encounter for general adult medical examination without abnormal findings: Secondary | ICD-10-CM

## 2016-06-23 DIAGNOSIS — K219 Gastro-esophageal reflux disease without esophagitis: Secondary | ICD-10-CM

## 2016-06-23 DIAGNOSIS — I6521 Occlusion and stenosis of right carotid artery: Secondary | ICD-10-CM

## 2016-06-23 DIAGNOSIS — F325 Major depressive disorder, single episode, in full remission: Secondary | ICD-10-CM

## 2016-06-23 DIAGNOSIS — R7309 Other abnormal glucose: Secondary | ICD-10-CM

## 2016-06-23 DIAGNOSIS — J45909 Unspecified asthma, uncomplicated: Secondary | ICD-10-CM

## 2016-06-23 DIAGNOSIS — Z136 Encounter for screening for cardiovascular disorders: Secondary | ICD-10-CM

## 2016-06-23 DIAGNOSIS — J449 Chronic obstructive pulmonary disease, unspecified: Secondary | ICD-10-CM

## 2016-06-23 DIAGNOSIS — K269 Duodenal ulcer, unspecified as acute or chronic, without hemorrhage or perforation: Secondary | ICD-10-CM

## 2016-06-23 DIAGNOSIS — M810 Age-related osteoporosis without current pathological fracture: Secondary | ICD-10-CM

## 2016-06-23 DIAGNOSIS — D126 Benign neoplasm of colon, unspecified: Secondary | ICD-10-CM

## 2016-06-23 LAB — CBC WITH DIFFERENTIAL/PLATELET
BASOS ABS: 46 {cells}/uL (ref 0–200)
BASOS PCT: 1 %
EOS ABS: 92 {cells}/uL (ref 15–500)
Eosinophils Relative: 2 %
HEMATOCRIT: 26.5 % — AB (ref 35.0–45.0)
Hemoglobin: 7.9 g/dL — ABNORMAL LOW (ref 11.7–15.5)
LYMPHS PCT: 39 %
Lymphs Abs: 1794 cells/uL (ref 850–3900)
MCH: 24.7 pg — AB (ref 27.0–33.0)
MCHC: 29.8 g/dL — ABNORMAL LOW (ref 32.0–36.0)
MCV: 82.8 fL (ref 80.0–100.0)
MONO ABS: 414 {cells}/uL (ref 200–950)
MPV: 9.7 fL (ref 7.5–12.5)
Monocytes Relative: 9 %
Neutro Abs: 2254 cells/uL (ref 1500–7800)
Neutrophils Relative %: 49 %
Platelets: 247 10*3/uL (ref 140–400)
RBC: 3.2 MIL/uL — ABNORMAL LOW (ref 3.80–5.10)
RDW: 18.3 % — AB (ref 11.0–15.0)
WBC: 4.6 10*3/uL (ref 3.8–10.8)

## 2016-06-23 LAB — VITAMIN B12: Vitamin B-12: 547 pg/mL (ref 200–1100)

## 2016-06-23 LAB — FERRITIN: Ferritin: 6 ng/mL — ABNORMAL LOW (ref 10–232)

## 2016-06-23 NOTE — Progress Notes (Signed)
Complete Physical  Assessment and Plan: Essential hypertension - CBC with Differential/Platelet - BASIC METABOLIC PANEL WITH GFR - Hepatic function panel - TSH - Microalbumin / creatinine urine ratio - Urine Microscopic  Mixed hyperlipidemia -continue medications, check lipids, decrease fatty foods, increase activity.  - Lipid panel   Severe carotid stenosis, asymptomatic, right s/p right carotid endarterectomy and doing very well without   Chronic obstructive pulmonary disease, unspecified COPD, unspecified chronic bronchitis type Stopped smoking continue Advair/albuterol inhaler, call if exacerbation  Asthma, unspecified asthma severity, uncomplicated Continue advair/albuterol  COLONIC POLYPS Up to date  Gastroesophageal reflux disease, esophagitis presence not specified Continue PPI/H2 blocker, diet discussed  Acute gastric ulcer Stopped smoking, no NSAIDS, continue medications.  Duodenal ulcer without hemorrhage or perforation and without obstruction Stopped smoking, no NSAIDS, continue medications.   Vitamin D deficiency - Vit D  25 hydroxy (rtn osteoporosis monitoring)  Abnormal glucose Discussed general issues about diabetes pathophysiology and management., Educational material distributed., Suggested low cholesterol diet., Encouraged aerobic exercise., Discussed foot care., Reminded to get yearly retinal exam.  Medication management - Magnesium  Internal hemorrhoids Monitor, add fiber, increase fluids, avoid constipation/straining.   Osteoporosis - DEXA next year  Depression, major, in remission monitor  Encounter for general adult medical examination with abnormal findings -     Get MGM, over due -     CBC with Differential/Platelet -     BASIC METABOLIC PANEL WITH GFR -     Hepatic function panel -     TSH -     Lipid panel -     Hemoglobin A1c -     Magnesium -     VITAMIN D 25 Hydroxy (Vit-D Deficiency, Fractures) -     Urinalysis, Routine w  reflex microscopic (not at [facility]) -     Microalbumin / creatinine urine ratio -     Iron and TIBC -     Ferritin -     Vitamin B12 -     EKG 12-Lead -     DG Chest 2 View; Future  History of tobacco use -     Urinalysis, Routine w reflex microscopic (not at [facility]) -     Microalbumin / creatinine urine ratio -     EKG 12-Lead -     DG Chest 2 View; Future  Mild malnutrition ([place]) Continue ensure    Discussed med's effects and SE's. Screening labs and tests as requested with regular follow-up as recommended.  HPI  60 y.o. female  presents for a complete physical.  Her blood pressure has been controlled at home, today their BP is BP: 110/60 She does not workout. She denies chest pain, shortness of breath, dizziness.  She has a 18 pack year smoking history and had a syncopal episode in 2016, had normal EEG, holter but severe right carotid stenosis and is s/p right CEA with Dr. Donnetta Hutching. She is on bASA, doing well.  currently  She has COPD and is on Advair/albuterol.  Quit smoking over a year ago.  She is on fosamax x 03/2014f or osteoporosis and has history of cervical compression fractures, will stop next year and repeat DEXA.  She is not on cholesterol medication due to intolerance of statins.  Her cholesterol is not at goal of less than 70. The cholesterol last visit was:   Lab Results  Component Value Date   CHOL 166 10/17/2015   HDL 24 (L) 10/17/2015   LDLCALC 87 10/17/2015   TRIG 274 (H)  10/17/2015   CHOLHDL 6.9 (H) 10/17/2015  Last A1C in the office was:  Lab Results  Component Value Date   HGBA1C 5.3 10/17/2015  Patient is on Vitamin D supplement.   Lab Results  Component Value Date   VD25OH 34 10/17/2015  Last EGD 2015 with Dr. Henrene Pastor that showed gastric ulcers, was on PPI BID.   Mom passed in June 2016, just now finishing up with her estate now which has been stressful.  BMI is Body mass index is 18 kg/m., she is drinking ensure and trying to lose weight.  Wt Readings  from Last 3 Encounters:  06/23/16 98 lb 6.4 oz (44.6 kg)  02/18/16 106 lb 8 oz (48.3 kg)  12/31/15 109 lb (49.4 kg)    Current Medications:  Current Outpatient Prescriptions on File Prior to Visit  Medication Sig Dispense Refill  . acetaminophen (TYLENOL) 500 MG tablet Take 500 mg by mouth every 6 (six) hours as needed for moderate pain or headache.     . albuterol (PROVENTIL HFA;VENTOLIN HFA) 108 (90 BASE) MCG/ACT inhaler Inhale 2 puffs into the lungs every 6 (six) hours as needed for wheezing or shortness of breath. 1 Inhaler 2  . alendronate (FOSAMAX) 70 MG tablet Take 70 mg by mouth every 7 (seven) days. Take with a full glass of water on an empty stomach.    Marland Kitchen aspirin EC 81 MG tablet Take 1 tablet (81 mg total) by mouth daily. 30 tablet 0  . cetirizine (ZYRTEC) 10 MG tablet Take 10 mg by mouth as needed for allergies.    . Cholecalciferol (VITAMIN D) 2000 UNITS CAPS Take 7000 units a day     . dicyclomine (BENTYL) 10 MG capsule Take 1 capsule (10 mg total) by mouth 3 (three) times daily before meals. 90 capsule 4  . ezetimibe (ZETIA) 10 MG tablet Take 1 tablet (10 mg total) by mouth daily. 30 tablet 5  . FeFum-FePoly-FA-B Cmp-C-Biot (INTEGRA PLUS) CAPS Take 1 capsule by mouth daily. 30 capsule 4  . Fluticasone-Salmeterol (ADVAIR DISKUS) 250-50 MCG/DOSE AEPB Inhale 1 puff into the lungs every 12 (twelve) hours.    Marland Kitchen omeprazole (PRILOSEC) 40 MG capsule TAKE 1 CAPSULE (40 MG TOTAL) BY MOUTH 2 (TWO) TIMES DAILY. 180 capsule 1   No current facility-administered medications on file prior to visit.    Health Maintenance:   Immunization History  Administered Date(s) Administered  . Pneumococcal Polysaccharide-23 12/18/2014  . Pneumococcal-Unspecified 05/18/2001  . Td 12/16/2004  . Tdap 01/02/2015   Tetanus: 2016 Pneumovax: 2016 Prevnar 13: due when 67 Flu vaccine: declines Zostavax: N/A  LMP: s/p hysterectomy Pap: Dr. Harrington Challenger  MGM: 12/2012  OVER DUE DEXA: 09/2015 osteoporisis , on  fosamax x 2014, will repeat next year and stop fosamax.  Colonoscopy: 12/2015 EGD: 09/2014 CT chest 11/2014 CXR 11/2014 MRI brain- 11/2014 Myocardial perfusion- 2009 Echo: 2010 Carotid US 11/2014 AB [place] 08/2014  Last Dental Exam: None, [facility] Last Eye Exam: Dr. Augusto Gamble  Patient Care Team: Unk Pinto, MD as PCP - General (Internal Medicine) Irene Shipper, MD as Consulting Physician (Gastroenterology) Harle Battiest, MD as Consulting Physician (Obstetrics and Gynecology)  Dr. Donnetta Hutching  Allergies:  Allergies  Allergen Reactions  . Celebrex [Celecoxib]     GI bleed  . Crestor [Rosuvastatin]     Made legs hurt  . Penicillins     REACTION: whelps  . Pravastatin     Muscle aches   Medical History:  Past Medical History:  Diagnosis  Date  . Allergy   . Anemia   . Arthritis   . Asthma   . Blood transfusion without reported diagnosis   . Cataract   . COPD (chronic obstructive pulmonary disease) ([place])   . Duodenal ulcer   . Emphysema of lung ([place])   . Family history of malignant neoplasm of gastrointestinal tract   . GERD (gastroesophageal reflux disease)   . Hepatitis    had in 6th grade pt unsure which kind  . Hiatal hernia   . HTN (hypertension) 10/03/2014  . Hx of colonic polyps   . Hyperlipemia   . Internal hemorrhoids   . Osteoporosis   . Peripheral vascular disease ([place])   . Pneumonia    hx of  . Shortness of breath dyspnea    with ambulation  . Vitamin D deficiency    SURGICAL HISTORY She  has a past surgical history that includes Abdominal hysterectomy; Appendectomy; Total hip arthroplasty (Right); Lumbar disc surgery; Elbow surgery (Right); Wrist surgery (Left); Hand surgery (Left); Esophagogastroduodenoscopy (10/2008); Colonoscopy; Upper gastrointestinal endoscopy; trigeminal neuralgia surgery; and Endarterectomy (Right, 12/16/2014). FAMILY HISTORY Her family history includes AAA (abdominal aortic aneurysm) in her brother and mother; Colon polyps in  her mother; Diabetes in her maternal grandfather; Heart attack in her brother, father, and mother; Heart disease in her brother, father, maternal grandfather, maternal grandmother, and mother; Hyperlipidemia in her brother, father, and mother; Hypertension in her brother, father, and mother; Inflammatory bowel disease in her mother; Stroke in her mother; Varicose Veins in her daughter and mother. SOCIAL HISTORY She  reports that she has been smoking Cigarettes.  She has a 9.00 pack-year smoking history. She has never used smokeless tobacco. She reports that she drinks alcohol. She reports that she does not use drugs.  Review of Systems: Review of Systems  Constitutional: Negative.   HENT: Negative.   Eyes: Negative.   Respiratory: Negative for cough, hemoptysis, sputum production, shortness of breath and wheezing.   Cardiovascular: Negative.   Gastrointestinal: Negative.   Genitourinary: Negative.   Musculoskeletal: Positive for joint pain. Negative for back pain, falls, myalgias and neck pain.  Skin: Negative.   Neurological: Negative for dizziness, tingling, tremors, sensory change, speech change, focal weakness, seizures and loss of consciousness.  Psychiatric/Behavioral: Negative.     Physical Exam: Estimated body mass index is 18 kg/m as calculated from the following:   Height as of this encounter: 5\' 2"  (1.575 m).   Weight as of this encounter: 98 lb 6.4 oz (44.6 kg). BP 110/60   Pulse 94   Temp 97.6 F (36.4 C)   Resp 16   Ht 5\' 2"  (1.575 m)   Wt 98 lb 6.4 oz (44.6 kg)   SpO2 98%   BMI 18.00 kg/m  General Appearance: Well nourished, in no apparent distress.  Eyes: PERRLA, EOMs, conjunctiva no swelling or erythema, normal fundi and vessels.  Sinuses: No Frontal/maxillary tenderness  ENT/Mouth: Ext aud canals clear, normal light reflex with TMs without erythema, bulging. Good dentition. No erythema, swelling, or exudate on post pharynx. Tonsils not swollen or erythematous.  Hearing normal.  Neck: Supple, thyroid normal. No bruits  Respiratory: Respiratory effort normal, diffuse decreased breath sounds without rales, rhonchi, wheezing or stridor.  Cardio: RRR without murmurs, rubs or gallops. Brisk peripheral pulses without edema.  Chest: symmetric, with normal excursions and percussion.  Breasts: declines.  Abdomen: Soft, nontender, no guarding, rebound, hernias, masses, or organomegaly. .  Lymphatics: Non tender without lymphadenopathy.  Genitourinary: declines  Musculoskeletal: Full ROM all peripheral extremities,5/5 strength, and normal gait.  Skin: Well healing carotid enterectomy scar. Warm, dry without rashes, lesions, ecchymosis. Neuro: Cranial nerves intact, reflexes equal bilaterally. Normal muscle tone, no cerebellar symptoms. Sensation intact.  Psych: Awake and oriented X 3, normal affect, Insight and Judgment appropriate.   EKG: WNL no changes AORTA SCAN: declines  Vicie Mutters 9:31 AM [facility]

## 2016-06-23 NOTE — Patient Instructions (Addendum)
[facility]  7 a.m.-6:30 p.m., Monday 7 a.m.-5 p.m., Tuesday-Friday Schedule an appointment by calling [phone].  Encourage you to get the 3D Mammogram  The 3D Mammogram is much more specific and sensitive to pick up breast cancer. For women with fibrocystic breast or lumpy breast it can be hard to determine if it is cancer or not but the 3D mammogram is able to tell this difference which cuts back on unneeded additional tests or scary call backs.   - over 40% increase in detection of breast cancer - over 40% reduction in false positives.  - fewer call backs - reduced anxiety - improved outcomes - [facility]  Preventive Care for Adults  A healthy lifestyle and preventive care can promote health and wellness. Preventive health guidelines for women include the following key practices.  A routine yearly physical is a good way to check with your health care provider about your health and preventive screening. It is a chance to share any concerns and updates on your health and to receive a thorough exam.  Visit your dentist for a routine exam and preventive care every 6 months. Brush your teeth twice a day and floss once a day. Good oral hygiene prevents tooth decay and gum disease.  The frequency of eye exams is based on your age, health, family medical history, use of contact lenses, and other factors. Follow your health care provider's recommendations for frequency of eye exams.  Eat a healthy diet. Foods like vegetables, fruits, whole grains, low-fat dairy products, and lean protein foods contain the nutrients you need without too many calories. Decrease your intake of foods high in solid fats, added sugars, and salt. Eat the right amount of calories for you.Get information about a proper diet from your health care provider, if necessary.  Regular physical exercise is one of the most important things you can do for your health. Most adults should get  at least 150 minutes of moderate-intensity exercise (any activity that increases your heart rate and causes you to sweat) each week. In addition, most adults need muscle-strengthening exercises on 2 or more days a week.  Maintain a healthy weight. The body mass index (BMI) is a screening tool to identify possible weight problems. It provides an estimate of body fat based on height and weight. Your health care provider can find your BMI and can help you achieve or maintain a healthy weight.For adults 20 years and older:  A BMI below 18.5 is considered underweight.  A BMI of 18.5 to 24.9 is normal.  A BMI of 25 to 29.9 is considered overweight.  A BMI of 30 and above is considered obese.  Maintain normal blood lipids and cholesterol levels by exercising and minimizing your intake of saturated fat. Eat a balanced diet with plenty of fruit and vegetables. Blood tests for lipids and cholesterol should begin at age 76 and be repeated every 5 years. If your lipid or cholesterol levels are high, you are over 50, or you are at high risk for heart disease, you may need your cholesterol levels checked more frequently.Ongoing high lipid and cholesterol levels should be treated with medicines if diet and exercise are not working.  If you smoke, find out from your health care provider how to quit. If you do not use tobacco, do not start.  Lung cancer screening is recommended for adults aged 86-80 years who are at high risk for developing lung cancer because of a history  of smoking. A yearly low-dose CT scan of the lungs is recommended for people who have at least a 30-pack-year history of smoking and are a current smoker or have quit within the past 15 years. A pack year of smoking is smoking an average of 1 pack of cigarettes a day for 1 year (for example: 1 pack a day for 30 years or 2 packs a day for 15 years). Yearly screening should continue until the smoker has stopped smoking for at least 15 years. Yearly  screening should be stopped for people who develop a health problem that would prevent them from having lung cancer treatment.  High blood pressure causes heart disease and increases the risk of stroke. Your blood pressure should be checked at least every 1 to 2 years. Ongoing high blood pressure should be treated with medicines if weight loss and exercise do not work.  If you are 55-79 years old, ask your health care provider if you should take aspirin to prevent strokes.  Diabetes screening involves taking a blood sample to check your fasting blood sugar level. This should be done once every 3 years, after age 45, if you are within normal weight and without risk factors for diabetes. Testing should be considered at a younger age or be carried out more frequently if you are overweight and have at least 1 risk factor for diabetes.  Breast cancer screening is essential preventive care for women. You should practice "breast self-awareness." This means understanding the normal appearance and feel of your breasts and may include breast self-examination. Any changes detected, no matter how small, should be reported to a health care provider. Women in their 20s and 30s should have a clinical breast exam (CBE) by a health care provider as part of a regular health exam every 1 to 3 years. After age 40, women should have a CBE every year. Starting at age 40, women should consider having a mammogram (breast X-ray test) every year. Women who have a family history of breast cancer should talk to their health care provider about genetic screening. Women at a high risk of breast cancer should talk to their health care providers about having an MRI and a mammogram every year.  Breast cancer gene (BRCA)-related cancer risk assessment is recommended for women who have family members with BRCA-related cancers. BRCA-related cancers include breast, ovarian, tubal, and peritoneal cancers. Having family members with these  cancers may be associated with an increased risk for harmful changes (mutations) in the breast cancer genes BRCA1 and BRCA2. Results of the assessment will determine the need for genetic counseling and BRCA1 and BRCA2 testing.  Routine pelvic exams to screen for cancer are no longer recommended for nonpregnant women who are considered low risk for cancer of the pelvic organs (ovaries, uterus, and vagina) and who do not have symptoms. Ask your health care provider if a screening pelvic exam is right for you.  If you have had past treatment for cervical cancer or a condition that could lead to cancer, you need Pap tests and screening for cancer for at least 20 years after your treatment. If Pap tests have been discontinued, your risk factors (such as having a new sexual partner) need to be reassessed to determine if screening should be resumed. Some women have medical problems that increase the chance of getting cervical cancer. In these cases, your health care provider may recommend more frequent screening and Pap tests.  Colorectal cancer can be detected and often prevented. Most   routine colorectal cancer screening begins at the age of 50 years and continues through age 75 years. However, your health care provider may recommend screening at an earlier age if you have risk factors for colon cancer. On a yearly basis, your health care provider may provide home test kits to check for hidden blood in the stool. Use of a small camera at the end of a tube, to directly examine the colon (sigmoidoscopy or colonoscopy), can detect the earliest forms of colorectal cancer. Talk to your health care provider about this at age 50, when routine screening begins. Direct exam of the colon should be repeated every 5-10 years through age 75 years, unless early forms of pre-cancerous polyps or small growths are found.  Hepatitis C blood testing is recommended for all people born from 1945 through 1965 and any individual with  known risks for hepatitis C.  Pra  Osteoporosis is a disease in which the bones lose minerals and strength with aging. This can result in serious bone fractures or breaks. The risk of osteoporosis can be identified using a bone density scan. Women ages 65 years and over and women at risk for fractures or osteoporosis should discuss screening with their health care providers. Ask your health care provider whether you should take a calcium supplement or vitamin D to reduce the rate of osteoporosis.  Menopause can be associated with physical symptoms and risks. Hormone replacement therapy is available to decrease symptoms and risks. You should talk to your health care provider about whether hormone replacement therapy is right for you.  Use sunscreen. Apply sunscreen liberally and repeatedly throughout the day. You should seek shade when your shadow is shorter than you. Protect yourself by wearing long sleeves, pants, a wide-brimmed hat, and sunglasses year round, whenever you are outdoors.  Once a month, do a whole body skin exam, using a mirror to look at the skin on your back. Tell your health care provider of new moles, moles that have irregular borders, moles that are larger than a pencil eraser, or moles that have changed in shape or color.  Stay current with required vaccines (immunizations).  Influenza vaccine. All adults should be immunized every year.  Tetanus, diphtheria, and acellular pertussis (Td, Tdap) vaccine. Pregnant women should receive 1 dose of Tdap vaccine during each pregnancy. The dose should be obtained regardless of the length of time since the last dose. Immunization is preferred during the 27th-36th week of gestation. An adult who has not previously received Tdap or who does not know her vaccine status should receive 1 dose of Tdap. This initial dose should be followed by tetanus and diphtheria toxoids (Td) booster doses every 10 years. Adults with an unknown or incomplete  history of completing a 3-dose immunization series with Td-containing vaccines should begin or complete a primary immunization series including a Tdap dose. Adults should receive a Td booster every 10 years.  Varicella vaccine. An adult without evidence of immunity to varicella should receive 2 doses or a second dose if she has previously received 1 dose. Pregnant females who do not have evidence of immunity should receive the first dose after pregnancy. This first dose should be obtained before leaving the health care facility. The second dose should be obtained 4-8 weeks after the first dose.  Human papillomavirus (HPV) vaccine. Females aged 13-26 years who have not received the vaccine previously should obtain the 3-dose series. The vaccine is not recommended for use in pregnant females. However, pregnancy testing is   not needed before receiving a dose. If a female is found to be pregnant after receiving a dose, no treatment is needed. In that case, the remaining doses should be delayed until after the pregnancy. Immunization is recommended for any person with an immunocompromised condition through the age of 59 years if she did not get any or all doses earlier. During the 3-dose series, the second dose should be obtained 4-8 weeks after the first dose. The third dose should be obtained 24 weeks after the first dose and 16 weeks after the second dose.  Zoster vaccine. One dose is recommended for adults aged 13 years or older unless certain conditions are present.  Measles, mumps, and rubella (MMR) vaccine. Adults born before 12 generally are considered immune to measles and mumps. Adults born in [birth year] or later should have 1 or more doses of MMR vaccine unless there is a contraindication to the vaccine or there is laboratory evidence of immunity to each of the three diseases. A routine second dose of MMR vaccine should be obtained at least 28 days after the first dose for students attending postsecondary  schools, health care workers, or international travelers. People who received inactivated measles vaccine or an unknown type of measles vaccine during 1963-1967 should receive 2 doses of MMR vaccine. People who received inactivated mumps vaccine or an unknown type of mumps vaccine before 1979 and are at high risk for mumps infection should consider immunization with 2 doses of MMR vaccine. For females of childbearing age, rubella immunity should be determined. If there is no evidence of immunity, females who are not pregnant should be vaccinated. If there is no evidence of immunity, females who are pregnant should delay immunization until after pregnancy. Unvaccinated health care workers born before [birth year] who lack laboratory evidence of measles, mumps, or rubella immunity or laboratory confirmation of disease should consider measles and mumps immunization with 2 doses of MMR vaccine or rubella immunization with 1 dose of MMR vaccine.  Pneumococcal 13-valent conjugate (PCV13) vaccine. When indicated, a person who is uncertain of her immunization history and has no record of immunization should receive the PCV13 vaccine. An adult aged 79 years or older who has certain medical conditions and has not been previously immunized should receive 1 dose of PCV13 vaccine. This PCV13 should be followed with a dose of pneumococcal polysaccharide (PPSV23) vaccine. The PPSV23 vaccine dose should be obtained at least 8 weeks after the dose of PCV13 vaccine. An adult aged 79 years or older who has certain medical conditions and previously received 1 or more doses of PPSV23 vaccine should receive 1 dose of PCV13. The PCV13 vaccine dose should be obtained 1 or more years after the last PPSV23 vaccine dose.    Pneumococcal polysaccharide (PPSV23) vaccine. When PCV13 is also indicated, PCV13 should be obtained first. All adults aged 100 years and older should be immunized. An adult younger than age 53 years who has certain medical  conditions should be immunized. Any person who resides in a nursing home or long-term care facility should be immunized. An adult smoker should be immunized. People with an immunocompromised condition and certain other conditions should receive both PCV13 and PPSV23 vaccines. People with human immunodeficiency virus (HIV) infection should be immunized as soon as possible after diagnosis. Immunization during chemotherapy or radiation therapy should be avoided. Routine use of PPSV23 vaccine is not recommended for American Indians, [place]atives, or people younger than 65 years unless there are medical conditions that require PPSV23 vaccine.  When indicated, people who have unknown immunization and have no record of immunization should receive PPSV23 vaccine. One-time revaccination 5 years after the first dose of PPSV23 is recommended for people aged 19-64 years who have chronic kidney failure, nephrotic syndrome, asplenia, or immunocompromised conditions. People who received 1-2 doses of PPSV23 before age 28 years should receive another dose of PPSV23 vaccine at age 33 years or later if at least 5 years have passed since the previous dose. Doses of PPSV23 are not needed for people immunized with PPSV23 at or after age 51 years.  Preventive Services / Frequency   Ages 66 to 43 years  Blood pressure check.  Lipid and cholesterol check.  Lung cancer screening. / Every year if you are aged 28-80 years and have a 30-pack-year history of smoking and currently smoke or have quit within the past 15 years. Yearly screening is stopped once you have quit smoking for at least 15 years or develop a health problem that would prevent you from having lung cancer treatment.  Clinical breast exam.** / Every year after age 65 years.  BRCA-related cancer risk assessment.** / For women who have family members with a BRCA-related cancer (breast, ovarian, tubal, or peritoneal cancers).  Mammogram.** / Every year beginning  at age 74 years and continuing for as long as you are in good health. Consult with your health care provider.  Pap test.** / Every 3 years starting at age 90 years through age 68 or 36 years with a history of 3 consecutive normal Pap tests.  HPV screening.** / Every 3 years from ages 59 years through ages 9 to 64 years with a history of 3 consecutive normal Pap tests.  Fecal occult blood test (FOBT) of stool. / Every year beginning at age 20 years and continuing until age 59 years. You may not need to do this test if you get a colonoscopy every 10 years.  Flexible sigmoidoscopy or colonoscopy.** / Every 5 years for a flexible sigmoidoscopy or every 10 years for a colonoscopy beginning at age 19 years and continuing until age 62 years.  Hepatitis C blood test.** / For all people born from [birth year] through 1965 and any individual with known risks for hepatitis C.  Skin self-exam. / Monthly.  Influenza vaccine. / Every year.  Tetanus, diphtheria, and acellular pertussis (Tdap/Td) vaccine.** / Consult your health care provider. Pregnant women should receive 1 dose of Tdap vaccine during each pregnancy. 1 dose of Td every 10 years.  Varicella vaccine.** / Consult your health care provider. Pregnant females who do not have evidence of immunity should receive the first dose after pregnancy.  Zoster vaccine.** / 1 dose for adults aged 34 years or older.  Pneumococcal 13-valent conjugate (PCV13) vaccine.** / Consult your health care provider.  Pneumococcal polysaccharide (PPSV23) vaccine.** / 1 to 2 doses if you smoke cigarettes or if you have certain conditions.  Meningococcal vaccine.** / Consult your health care provider.  Hepatitis A vaccine.** / Consult your health care provider.  Hepatitis B vaccine.** / Consult your health care provider. Screening for abdominal aortic aneurysm (AAA)  by ultrasound is recommended for people over 50 who have history of high blood pressure or who are current  or former smokers.

## 2016-06-24 ENCOUNTER — Telehealth: Payer: Self-pay | Admitting: Internal Medicine

## 2016-06-24 LAB — TSH: TSH: 1.51 mIU/L

## 2016-06-24 LAB — HEPATIC FUNCTION PANEL
ALBUMIN: 4.1 g/dL (ref 3.6–5.1)
ALK PHOS: 69 U/L (ref 33–130)
ALT: 6 U/L (ref 6–29)
AST: 16 U/L (ref 10–35)
Bilirubin, Direct: 0 mg/dL (ref <=0.2)
Indirect Bilirubin: 0.1 mg/dL — ABNORMAL LOW (ref 0.2–1.2)
TOTAL PROTEIN: 7.2 g/dL (ref 6.1–8.1)
Total Bilirubin: 0.1 mg/dL — ABNORMAL LOW (ref 0.2–1.2)

## 2016-06-24 LAB — URINALYSIS, ROUTINE W REFLEX MICROSCOPIC
BILIRUBIN URINE: NEGATIVE
GLUCOSE, UA: NEGATIVE
HGB URINE DIPSTICK: NEGATIVE
Ketones, ur: NEGATIVE
Leukocytes, UA: NEGATIVE
Nitrite: NEGATIVE
PROTEIN: NEGATIVE
Specific Gravity, Urine: 1.01 (ref 1.001–1.035)
pH: 6 (ref 5.0–8.0)

## 2016-06-24 LAB — HEMOGLOBIN A1C
HEMOGLOBIN A1C: 4.8 % (ref <5.7)
MEAN PLASMA GLUCOSE: 91 mg/dL

## 2016-06-24 LAB — BASIC METABOLIC PANEL WITH GFR
BUN: 25 mg/dL (ref 7–25)
CHLORIDE: 108 mmol/L (ref 98–110)
CO2: 20 mmol/L (ref 20–31)
CREATININE: 0.63 mg/dL (ref 0.50–1.05)
Calcium: 9.1 mg/dL (ref 8.6–10.4)
GFR, Est African American: >89 mL/min (ref >=60)
GFR, Est Non African American: >89 mL/min (ref >=60)
GLUCOSE: 90 mg/dL (ref 65–99)
POTASSIUM: 5.3 mmol/L (ref 3.5–5.3)
Sodium: 137 mmol/L (ref 135–146)

## 2016-06-24 LAB — IRON AND TIBC
%SAT: 1 % — AB (ref 11–50)
TIBC: 558 ug/dL — ABNORMAL HIGH (ref 250–450)
UIBC: 553 ug/dL — ABNORMAL HIGH (ref 125–400)

## 2016-06-24 LAB — LIPID PANEL
Cholesterol: 170 mg/dL (ref 125–200)
HDL: 34 mg/dL — ABNORMAL LOW (ref >=46)
LDL CALC: 92 mg/dL (ref <130)
Total CHOL/HDL Ratio: 5 Ratio (ref <=5.0)
Triglycerides: 222 mg/dL — ABNORMAL HIGH (ref <150)
VLDL: 44 mg/dL — ABNORMAL HIGH (ref <30)

## 2016-06-24 LAB — MICROALBUMIN / CREATININE URINE RATIO
Creatinine, Urine: 19 mg/dL — ABNORMAL LOW (ref 20–320)
Microalb Creat Ratio: 47 mcg/mg creat — ABNORMAL HIGH (ref <30)
Microalb, Ur: 0.9 mg/dL

## 2016-06-24 LAB — VITAMIN D 25 HYDROXY (VIT D DEFICIENCY, FRACTURES): VIT D 25 HYDROXY: 56 ng/mL (ref 30–100)

## 2016-06-24 LAB — MAGNESIUM: MAGNESIUM: 2.1 mg/dL (ref 1.5–2.5)

## 2016-06-24 NOTE — Telephone Encounter (Signed)
Pt scheduled to see Nicoletta Ba PA tomorrow at 9:30am. Pt will have CBC done today at their office. Katrina to notify pt of appt.

## 2016-06-25 ENCOUNTER — Ambulatory Visit (INDEPENDENT_AMBULATORY_CARE_PROVIDER_SITE_OTHER): Payer: BLUE CROSS/BLUE SHIELD | Admitting: Physician Assistant

## 2016-06-25 ENCOUNTER — Encounter: Payer: Self-pay | Admitting: Physician Assistant

## 2016-06-25 ENCOUNTER — Other Ambulatory Visit (INDEPENDENT_AMBULATORY_CARE_PROVIDER_SITE_OTHER): Payer: BLUE CROSS/BLUE SHIELD

## 2016-06-25 ENCOUNTER — Other Ambulatory Visit: Payer: Self-pay

## 2016-06-25 VITALS — BP 118/60 | HR 80 | Ht 61.0 in | Wt 97.4 lb

## 2016-06-25 DIAGNOSIS — R938 Abnormal findings on diagnostic imaging of other specified body structures: Secondary | ICD-10-CM | POA: Diagnosis not present

## 2016-06-25 DIAGNOSIS — Z01812 Encounter for preprocedural laboratory examination: Secondary | ICD-10-CM | POA: Diagnosis not present

## 2016-06-25 DIAGNOSIS — R9389 Abnormal findings on diagnostic imaging of other specified body structures: Secondary | ICD-10-CM

## 2016-06-25 DIAGNOSIS — R5383 Other fatigue: Secondary | ICD-10-CM

## 2016-06-25 DIAGNOSIS — D509 Iron deficiency anemia, unspecified: Secondary | ICD-10-CM

## 2016-06-25 DIAGNOSIS — D61818 Other pancytopenia: Secondary | ICD-10-CM | POA: Insufficient documentation

## 2016-06-25 DIAGNOSIS — D649 Anemia, unspecified: Secondary | ICD-10-CM | POA: Insufficient documentation

## 2016-06-25 DIAGNOSIS — K279 Peptic ulcer, site unspecified, unspecified as acute or chronic, without hemorrhage or perforation: Secondary | ICD-10-CM

## 2016-06-25 LAB — CREATININE, SERUM: Creatinine, Ser: 0.6 mg/dL (ref 0.40–1.20)

## 2016-06-25 LAB — CBC WITH DIFFERENTIAL/PLATELET
BASOS PCT: 0.1 % (ref 0.0–3.0)
Basophils Absolute: 0 10*3/uL (ref 0.0–0.1)
EOS PCT: 1.6 % (ref 0.0–5.0)
Eosinophils Absolute: 0.1 10*3/uL (ref 0.0–0.7)
HCT: 26.5 % — ABNORMAL LOW (ref 36.0–46.0)
Hemoglobin: 8.3 g/dL — ABNORMAL LOW (ref 12.0–15.0)
LYMPHS ABS: 1.9 10*3/uL (ref 0.7–4.0)
Lymphocytes Relative: 34.4 % (ref 12.0–46.0)
MCHC: 31.4 g/dL (ref 30.0–36.0)
MCV: 81.6 fl (ref 78.0–100.0)
MONO ABS: 0.5 10*3/uL (ref 0.1–1.0)
Monocytes Relative: 9.6 % (ref 3.0–12.0)
NEUTROS PCT: 54.3 % (ref 43.0–77.0)
Neutro Abs: 3 10*3/uL (ref 1.4–7.7)
Platelets: 268 10*3/uL (ref 150.0–400.0)
RBC: 3.25 Mil/uL — ABNORMAL LOW (ref 3.87–5.11)
RDW: 18.9 % — AB (ref 11.5–15.5)
WBC: 5.4 10*3/uL (ref 4.0–10.5)

## 2016-06-25 LAB — BUN: BUN: 28 mg/dL — ABNORMAL HIGH (ref 6–23)

## 2016-06-25 NOTE — Progress Notes (Signed)
Agree with initial assessment and plans as outlined 

## 2016-06-25 NOTE — Patient Instructions (Signed)
You have been scheduled for a capsule endoscopy on 07/02/16 at 8 am.   Your physician has requested that you go to the basement for the following lab work before leaving today: CBC  Continue Prilosec 40 mg twice a day.

## 2016-06-25 NOTE — Progress Notes (Signed)
Able to get an appointment with GI this AM.  Asymptomatic anemia, patient with history of GI ulcer, has appointment with GI today.

## 2016-06-25 NOTE — Progress Notes (Signed)
Subjective:    Patient ID: [name], female    DOB: 11-25-1955, 60 y.o.   MRN: [id number]  HPI  [name] is a pleasant 60 year old white female referred by Dr. Deitra Mayo PA-C for evaluation of worsening anemia and iron deficiency. Patient is known to Dr. Marina Goodell. She was last evaluated in our office in March 2017 when she went EGD and colonoscopy for anemia and abdominal pain. EGD revealed a duodenal bulb ulcer approximately 12 mm and prepyloric ulcer 8 mm in size both clean based. CLOtest was negative. Colonoscopy was normal. At that time hemoglobin was 10.7 hematocrit of 32.6. Patient has remained on twice daily PPI therapy and denies any NSAID use, denies BCs Goody's etc. She does take a baby aspirin for peripheral vascular disease. She was seen recently by primary care and labs showed hemoglobin of 7.9 hematocrit of 26.5 on September 6 MCV of 82 iron less than 10 TIBC 558 iron saturation 1 and ferritin of 6. She has started Integra +1 daily. She is referred back for further evaluation. She does have history of COPD, hypertension, peripheral vascular disease, history of recurrent peptic ulcer disease and is status post cholecystectomy. She has had prior adenomatous polyps. Complaint today is fatigue. She denies any abdominal pain ,no nausea or vomiting, her bowel movements have been normal and has not noted any melena or hematochezia. She denies any shortness of breath or chest pain.  Review of Systems Pertinent positive and negative review of systems were noted in the above HPI section.  All other review of systems was otherwise negative.  Outpatient Encounter Prescriptions as of 06/25/2016  Medication Sig  . acetaminophen (TYLENOL) 500 MG tablet Take 500 mg by mouth every 6 (six) hours as needed for moderate pain or headache.   . albuterol (PROVENTIL HFA;VENTOLIN HFA) 108 (90 BASE) MCG/ACT inhaler Inhale 2 puffs into the lungs every 6 (six) hours as needed for wheezing or  shortness of breath.  Marland Kitchen aspirin EC 81 MG tablet Take 1 tablet (81 mg total) by mouth daily.  . cetirizine (ZYRTEC) 10 MG tablet Take 10 mg by mouth as needed for allergies.  . Cholecalciferol (VITAMIN D) 2000 UNITS CAPS Take 7000 units a day   . dicyclomine (BENTYL) 10 MG capsule Take 1 capsule (10 mg total) by mouth 3 (three) times daily before meals.  Marland Kitchen ezetimibe (ZETIA) 10 MG tablet Take 1 tablet (10 mg total) by mouth daily.  Marland Kitchen FeFum-FePoly-FA-B Cmp-C-Biot (INTEGRA PLUS) CAPS Take 1 capsule by mouth daily.  . Fluticasone-Salmeterol (ADVAIR DISKUS) 250-50 MCG/DOSE AEPB Inhale 1 puff into the lungs every 12 (twelve) hours.  Marland Kitchen omeprazole (PRILOSEC) 40 MG capsule TAKE 1 CAPSULE (40 MG TOTAL) BY MOUTH 2 (TWO) TIMES DAILY.  . [DISCONTINUED] alendronate (FOSAMAX) 70 MG tablet Take 70 mg by mouth every 7 (seven) days. Take with a full glass of water on an empty stomach.   No facility-administered encounter medications on file as of 06/25/2016.    Allergies  Allergen Reactions  . Celebrex [Celecoxib]     GI bleed  . Crestor [Rosuvastatin]     Made legs hurt  . Penicillins     REACTION: whelps  . Pravastatin     Muscle aches   Patient Active Problem List   Diagnosis Date Noted  . Anemia 06/25/2016  . Mild malnutrition (HCC) 06/23/2016  . Osteoporosis 01/02/2015  . Depression, major, in remission (HCC) 01/02/2015  . Carotid stenosis, asymptomatic 12/16/2014  . HTN (hypertension) 10/03/2014  .  Abnormal glucose 10/03/2014  . Medication management 10/03/2014  . COPD (chronic obstructive pulmonary disease) (HCC)   . Asthma   . Vitamin D deficiency   . COLONIC POLYPS 04/30/2008  . Mixed hyperlipidemia 04/30/2008  . Internal hemorrhoids 04/30/2008  . GERD 04/30/2008  . Gastric ulcer 04/30/2008  . Duodenal ulcer without hemorrhage or perforation and without obstruction 04/30/2008   Social History   Social History  . Marital status: Married    Spouse name: N/A  . Number of children: 1   . Years of education: N/A   Occupational History  . disability    Social History Main Topics  . Smoking status: Current Some Day Smoker    Packs/day: 0.25    Years: 36.00    Types: Cigarettes    Last attempt to quit: 06/18/2014  . Smokeless tobacco: Never Used     Comment: 1-2 sometimes not daily  . Alcohol use 0.0 oz/week     Comment: occasional  . Drug use: No  . Sexual activity: Not Currently   Other Topics Concern  . Not on file   Social History Narrative  . No narrative on file    [name]'s family history includes AAA (abdominal aortic aneurysm) in her brother and mother; Colon polyps in her mother; Diabetes in her maternal grandfather; Heart attack in her brother, father, and mother; Heart disease in her brother, father, maternal grandfather, maternal grandmother, and mother; Hyperlipidemia in her brother, father, and mother; Hypertension in her brother, father, and mother; Inflammatory bowel disease in her mother; Stroke in her mother; Varicose Veins in her daughter and mother.      Objective:    Vitals:   06/25/16 0926  BP: 118/60  Pulse: 80    Physical Exam  well-developed white female in no acute distress, blood pressure 118/60 pulse 80 height 5 foot 1 weight 97 BMI of 18.4, very pleasant. HEENT; nontraumatic normocephalic EOMI PERRLA sclera anicteric, Cardiovascular ;regular rate and rhythm with S1-S2 no murmur or gallop, Pulmonary ;clear bilaterally, somewhat decreased breath sounds, Abdomen soft nontender nondistended bowel sounds are active, bowel sounds present, Rectal ;exam brown stool Hemoccult negative, Extremities; no clubbing cyanosis or edema skin warm and dry, Neuropsych ;mood and affect appropriate       Assessment & Plan:   #79 60 year old female with progressive iron deficiency anemia. Recent EGD and colonoscopy positive for duodenal bulb and pyloric ulcers. Fortunately she is heme-negative today. It is possible that she has nonhealing ulcers  which may be causing intermittent chronic GI blood loss, also need to rule out small bowel source for her iron deficiency anemia i.e. AVMs etc. #2 he OPD #3 hypertension #4 peripheral vascular disease #5 prior history of adenomatous colon polyps, negative colonoscopy 2013 and 2017   Plan; we'll obtain stat CBC today his hemoglobin is less than 7 she will require outpatient transfusion 2 Schedule for capsule endoscopy Continue Integra +1 daily Continue omeprazole 40 mg by mouth twice a day We'll plan to repeat her CBC in one week when she comes back for capsule endoscopy as well.   Elmor Kost S Riker Collier PA-C 06/25/2016   Cc: [name], MD

## 2016-07-01 ENCOUNTER — Telehealth: Payer: Self-pay | Admitting: Internal Medicine

## 2016-07-01 NOTE — Telephone Encounter (Signed)
Pts capsule rescheduled to 07/08/16@8am . Pt aware.

## 2016-07-08 ENCOUNTER — Telehealth: Payer: Self-pay | Admitting: *Deleted

## 2016-07-08 ENCOUNTER — Ambulatory Visit (INDEPENDENT_AMBULATORY_CARE_PROVIDER_SITE_OTHER): Payer: BLUE CROSS/BLUE SHIELD | Admitting: Internal Medicine

## 2016-07-08 ENCOUNTER — Other Ambulatory Visit: Payer: Self-pay

## 2016-07-08 ENCOUNTER — Other Ambulatory Visit (INDEPENDENT_AMBULATORY_CARE_PROVIDER_SITE_OTHER): Payer: BLUE CROSS/BLUE SHIELD

## 2016-07-08 ENCOUNTER — Encounter: Payer: Self-pay | Admitting: Internal Medicine

## 2016-07-08 DIAGNOSIS — D509 Iron deficiency anemia, unspecified: Secondary | ICD-10-CM

## 2016-07-08 DIAGNOSIS — D500 Iron deficiency anemia secondary to blood loss (chronic): Secondary | ICD-10-CM

## 2016-07-08 LAB — CBC WITH DIFFERENTIAL/PLATELET
Basophils Absolute: 0 10*3/uL (ref 0.0–0.1)
Basophils Relative: 0 % (ref 0.0–3.0)
EOS PCT: 1.6 % (ref 0.0–5.0)
Eosinophils Absolute: 0.1 10*3/uL (ref 0.0–0.7)
LYMPHS PCT: 33.9 % (ref 12.0–46.0)
Lymphs Abs: 1.6 10*3/uL (ref 0.7–4.0)
MCHC: 30.9 g/dL (ref 30.0–36.0)
MCV: 80.2 fl (ref 78.0–100.0)
MONO ABS: 0.4 10*3/uL (ref 0.1–1.0)
MONOS PCT: 9 % (ref 3.0–12.0)
NEUTROS PCT: 55.5 % (ref 43.0–77.0)
Neutro Abs: 2.7 10*3/uL (ref 1.4–7.7)
Platelets: 263 10*3/uL (ref 150.0–400.0)
RBC: 3.16 Mil/uL — AB (ref 3.87–5.11)
RDW: 18.5 % — ABNORMAL HIGH (ref 11.5–15.5)
WBC: 4.9 10*3/uL (ref 4.0–10.5)

## 2016-07-08 NOTE — Progress Notes (Signed)
Pt tolerated procedure well and verbalized all written and verbal instructions.  [id number] Lot number [id number] Exp 09/23/2017

## 2016-07-09 ENCOUNTER — Other Ambulatory Visit: Payer: Self-pay | Admitting: *Deleted

## 2016-07-09 ENCOUNTER — Telehealth: Payer: Self-pay | Admitting: *Deleted

## 2016-07-09 NOTE — Telephone Encounter (Signed)
See note from 07-08-2016. Capsule Endo.

## 2016-07-13 ENCOUNTER — Telehealth: Payer: Self-pay | Admitting: Internal Medicine

## 2016-07-13 NOTE — Telephone Encounter (Signed)
Referral is confirmed. Notes from Hematology indicate they will be calling her to schedule an appointment. Called to the patient to reassure her the referral has not been overlooked.

## 2016-07-15 ENCOUNTER — Telehealth: Payer: Self-pay

## 2016-07-15 NOTE — Telephone Encounter (Signed)
I called the pt to confirm that she passed the capsule, pt states she is sure she did and has not had any problems.  She will call back if she develops any symptoms and will wait for a call with the capsule results.

## 2016-07-15 NOTE — Telephone Encounter (Signed)
-----   Message from Barron Alvine, RN sent at 07/08/2016  4:16 PM EDT ----- Make sure pt passed capsule

## 2016-07-20 ENCOUNTER — Ambulatory Visit (HOSPITAL_BASED_OUTPATIENT_CLINIC_OR_DEPARTMENT_OTHER): Payer: BLUE CROSS/BLUE SHIELD | Admitting: Hematology and Oncology

## 2016-07-20 ENCOUNTER — Encounter: Payer: Self-pay | Admitting: Hematology and Oncology

## 2016-07-20 DIAGNOSIS — D508 Other iron deficiency anemias: Secondary | ICD-10-CM

## 2016-07-20 DIAGNOSIS — Z800 Family history of malignant neoplasm of digestive organs: Secondary | ICD-10-CM

## 2016-07-20 DIAGNOSIS — Z720 Tobacco use: Secondary | ICD-10-CM | POA: Diagnosis not present

## 2016-07-20 DIAGNOSIS — D509 Iron deficiency anemia, unspecified: Secondary | ICD-10-CM | POA: Diagnosis not present

## 2016-07-20 DIAGNOSIS — Z8371 Family history of colonic polyps: Secondary | ICD-10-CM | POA: Diagnosis not present

## 2016-07-20 NOTE — Progress Notes (Signed)
[facility] NOTE  Patient Care Team: Unk Pinto, MD as PCP - General (Internal Medicine) Irene Shipper, MD as Consulting Physician (Gastroenterology) Harle Battiest, MD as Consulting Physician (Obstetrics and Gynecology)  CHIEF COMPLAINTS/PURPOSE OF CONSULTATION:  Profound iron deficiency anemia  HISTORY OF PRESENTING ILLNESS:  [name] 60 y.o. female is here because of recent diagnosis of severe anemia. Patient has known about iron deficiency anemia at least since January 2016. She has been on oral iron supplementation without any major improvement in her hemoglobin levels. Most recently her hemoglobin has dropped below 8 g. She feels extremely tired and has shortness of breath to exertion. She is been going through with this for several months. She had many tests including upper endoscopies and colonoscopies which were apparently normal. Does patient also had a capsule endoscopy and she does not know the results of the test. She denies any blood loss in her stools are through her vaginal. She does report that she gets gastric intolerance to certain foods and she tends to have diarrhea frequently. She has been losing significant weight and has dropped below 100 pounds.   I reviewed her records extensively and collaborated the history with the patient.  MEDICAL HISTORY:  Past Medical History:  Diagnosis Date  . Allergy   . Anemia   . Arthritis   . Asthma   . Blood transfusion without reported diagnosis   . Cataract   . COPD (chronic obstructive pulmonary disease) ([place])   . Duodenal ulcer   . Emphysema of lung ([place])   . Family history of malignant neoplasm of gastrointestinal tract   . GERD (gastroesophageal reflux disease)   . Hepatitis    had in 6th grade pt unsure which kind  . Hiatal hernia   . HTN (hypertension) 10/03/2014  . Hx of colonic polyps   . Hyperlipemia   . Internal hemorrhoids   . Osteoporosis   . Peripheral vascular disease ([place])    . Pneumonia    hx of  . Shortness of breath dyspnea    with ambulation  . Vitamin D deficiency     SURGICAL HISTORY: Past Surgical History:  Procedure Laterality Date  . ABDOMINAL HYSTERECTOMY    . APPENDECTOMY    . CATARACT EXTRACTION, BILATERAL    . COLONOSCOPY    . ELBOW SURGERY Right   . ENDARTERECTOMY Right 12/16/2014   Procedure: ENDARTERECTOMY CAROTID with Dacron patch;  Surgeon: Rosetta Posner, MD;  Location: [facility];  Service: Vascular;  Laterality: Right;  . ESOPHAGOGASTRODUODENOSCOPY  10/2008  . HAND SURGERY Left   . LUMBAR DISC SURGERY    . TOTAL HIP ARTHROPLASTY Right   . trigeminal neuralgia surgery    . UPPER GASTROINTESTINAL ENDOSCOPY    . WRIST SURGERY Left     SOCIAL HISTORY: Social History   Social History  . Marital status: Married    Spouse name: N/A  . Number of children: 1  . Years of education: N/A   Occupational History  . disability    Social History Main Topics  . Smoking status: Current Some Day Smoker    Packs/day: 0.25    Years: 36.00    Types: Cigarettes    Last attempt to quit: 06/18/2014  . Smokeless tobacco: Never Used     Comment: 1-2 sometimes not daily  . Alcohol use 0.0 oz/week     Comment: occasional  . Drug use: No  . Sexual activity: Not Currently   Other Topics Concern  .  Not on file   Social History Narrative  . No narrative on file    FAMILY HISTORY: Family History  Problem Relation Age of Onset  . Hypertension Father   . Hyperlipidemia Father   . Heart disease Father   . Heart attack Father   . Colon polyps Mother   . Inflammatory bowel disease Mother   . Hypertension Mother   . Stroke Mother   . Heart disease Mother   . Hyperlipidemia Mother   . Varicose Veins Mother   . Heart attack Mother   . AAA (abdominal aortic aneurysm) Mother   . Diabetes Maternal Grandfather   . Heart disease Maternal Grandfather   . Heart disease Maternal Grandmother   . Colon cancer      mat great aunt  . Heart disease  Brother   . Hyperlipidemia Brother   . Hypertension Brother   . Heart attack Brother   . AAA (abdominal aortic aneurysm) Brother   . Varicose Veins Daughter   . Esophageal cancer Neg Hx   . Stomach cancer Neg Hx   . Rectal cancer Neg Hx     ALLERGIES:  is allergic to celebrex [celecoxib]; crestor [rosuvastatin]; penicillins; and pravastatin.  MEDICATIONS:  Current Outpatient Prescriptions  Medication Sig Dispense Refill  . acetaminophen (TYLENOL) 500 MG tablet Take 500 mg by mouth every 6 (six) hours as needed for moderate pain or headache.     . albuterol (PROVENTIL HFA;VENTOLIN HFA) 108 (90 BASE) MCG/ACT inhaler Inhale 2 puffs into the lungs every 6 (six) hours as needed for wheezing or shortness of breath. 1 Inhaler 2  . aspirin EC 81 MG tablet Take 1 tablet (81 mg total) by mouth daily. 30 tablet 0  . cetirizine (ZYRTEC) 10 MG tablet Take 10 mg by mouth as needed for allergies.    . Cholecalciferol (VITAMIN D) 2000 UNITS CAPS Take 7000 units a day     . dicyclomine (BENTYL) 10 MG capsule Take 1 capsule (10 mg total) by mouth 3 (three) times daily before meals. 90 capsule 4  . ezetimibe (ZETIA) 10 MG tablet Take 1 tablet (10 mg total) by mouth daily. 30 tablet 5  . FeFum-FePoly-FA-B Cmp-C-Biot (INTEGRA PLUS) CAPS Take 1 capsule by mouth daily. 30 capsule 4  . Fluticasone-Salmeterol (ADVAIR DISKUS) 250-50 MCG/DOSE AEPB Inhale 1 puff into the lungs every 12 (twelve) hours.    Marland Kitchen omeprazole (PRILOSEC) 40 MG capsule TAKE 1 CAPSULE (40 MG TOTAL) BY MOUTH 2 (TWO) TIMES DAILY. 180 capsule 1   No current facility-administered medications for this visit.     REVIEW OF SYSTEMS:   Constitutional: Denies fevers, chills or abnormal night sweats Eyes: Denies blurriness of vision, double vision or watery eyes Ears, nose, mouth, throat, and face: Denies mucositis or sore throat Respiratory:Shortness of breath exertion Cardiovascular: Denies palpitation, chest discomfort or lower extremity  swelling Gastrointestinal:  Denies nausea, heartburn or change in bowel habits Skin: Denies abnormal skin rashes Lymphatics: Denies new lymphadenopathy or easy bruising Neurological:Denies numbness, tingling or new weaknesses Behavioral/Psych: Mood is stable, no new changes  All other systems were reviewed with the patient and are negative.  PHYSICAL EXAMINATION: ECOG PERFORMANCE STATUS: 1 - Symptomatic but completely ambulatory  Vitals:   07/20/16 1258  BP: (!) 119/59  Pulse: (!) 101  Resp: 18  Temp: 98 F (36.7 C)   Filed Weights   07/20/16 1258  Weight: 97 lb 8 oz (44.2 kg)    GENERAL:alert, no distress and comfortable SKIN: skin  color, texture, turgor are normal, no rashes or significant lesions EYES: normal, conjunctiva are pink and non-injected, sclera clear OROPHARYNX:no exudate, no erythema and lips, buccal mucosa, and tongue normal  NECK: supple, thyroid normal size, non-tender, without nodularity LYMPH:  no palpable lymphadenopathy in the cervical, axillary or inguinal LUNGS: clear to auscultation and percussion with normal breathing effort HEART: regular rate & rhythm and no murmurs and no lower extremity edema ABDOMEN:abdomen soft, non-tender and normal bowel sounds Musculoskeletal:no cyanosis of digits and no clubbing  PSYCH: alert & oriented x 3 with fluent speech NEURO: no focal motor/sensory deficits   LABORATORY DATA:  I have reviewed the data as listed Lab Results  Component Value Date   WBC 4.9 07/08/2016   HGB 7.8 Repeated and verified X2. (LL) 07/08/2016   HCT 25.4 Repeated and verified X2. (L) 07/08/2016   MCV 80.2 07/08/2016   PLT 263.0 07/08/2016   Lab Results  Component Value Date   NA 137 06/23/2016   K 5.3 06/23/2016   CL 108 06/23/2016   CO2 20 06/23/2016   ASSESSMENT AND PLAN:  Iron deficiency anemia Profound iron deficiency anemia since at least January 2016. Extensive workup with upper endoscopy, colonoscopy did not reveal any  abnormalities. Capsule endoscopy was performed and patient does not know the results of that test. She does not have any other clinical evidence of bleeding.  Most recent blood work on 07/08/2016 revealed hemoglobin of 7.8 with an MCV of 80, RDW 18.5, normal white cells and platelets. Normal B-12 547 Ferritin of 6, 7 months ago it was 6 and 9 months ago it was 12 Iron saturation of 1% with a TIBC of 558  Differential diagnosis: Blood loss versus malabsorption. Based on normal endoscopies, I suspect malabsorption as the most likely cause. Patient does have some symptoms related to celiac disease with diarrhea related to eating certain types of carbohydrates.  Plan: 1. IV iron therapy with Feraheme to start next Tuesday 2 doses one week apart 2. blood tests for celiac antibodies 3. Return back to see [place] in 6 weeks to recheck on his blood work and follow-up.  I discussed with him that he may need blood work periodically and transfuse iron on an as-needed basis.  Tobacco cessation: I discussed with her extensively that she needs to quit smoking. I offered her any help that she needs.   All questions were answered. The patient knows to call the clinic with any problems, questions or concerns.    Rulon Eisenmenger, MD 07/20/16

## 2016-07-20 NOTE — Assessment & Plan Note (Signed)
Profound iron deficiency anemia since at least January 2016. Extensive workup with upper endoscopy, colonoscopy did not reveal any abnormalities. Capsule endoscopy was performed and patient does not know the results of that test. She does not have any other clinical evidence of bleeding.  Most recent blood work on 07/08/2016 revealed hemoglobin of 7.8 with an MCV of 80, RDW 18.5, normal white cells and platelets. Normal B-12 547 Ferritin of 6, 7 months ago it was 6 and 9 months ago it was 12 Iron saturation of 1% with a TIBC of 558  Differential diagnosis: Blood loss versus malabsorption. Based on normal endoscopies, I suspect malabsorption as the most likely cause. Patient does have some symptoms related to celiac disease with diarrhea related to eating certain types of carbohydrates.  Plan: 1. IV iron therapy with Feraheme to start next Tuesday 2 doses one week apart 2. blood tests for celiac antibodies 3. Return back to see [place] in 6 weeks to recheck on his blood work and follow-up.  I discussed with him that he may need blood work periodically and transfuse iron on an as-needed basis.  Tobacco cessation: I discussed with her extensively that she needs to quit smoking. I offered her any help that she needs.

## 2016-07-21 ENCOUNTER — Other Ambulatory Visit: Payer: Self-pay

## 2016-07-21 DIAGNOSIS — D508 Other iron deficiency anemias: Secondary | ICD-10-CM

## 2016-07-27 ENCOUNTER — Other Ambulatory Visit (HOSPITAL_BASED_OUTPATIENT_CLINIC_OR_DEPARTMENT_OTHER): Payer: BLUE CROSS/BLUE SHIELD

## 2016-07-27 ENCOUNTER — Ambulatory Visit (HOSPITAL_BASED_OUTPATIENT_CLINIC_OR_DEPARTMENT_OTHER): Payer: BLUE CROSS/BLUE SHIELD

## 2016-07-27 VITALS — BP 111/53 | HR 80 | Temp 98.3°F | Resp 17

## 2016-07-27 DIAGNOSIS — D509 Iron deficiency anemia, unspecified: Secondary | ICD-10-CM | POA: Diagnosis not present

## 2016-07-27 DIAGNOSIS — D508 Other iron deficiency anemias: Secondary | ICD-10-CM

## 2016-07-27 LAB — CBC & DIFF AND RETIC
BASO%: 1.1 % (ref 0.0–2.0)
Basophils Absolute: 0.1 10*3/uL (ref 0.0–0.1)
EOS%: 3.4 % (ref 0.0–7.0)
Eosinophils Absolute: 0.2 10*3/uL (ref 0.0–0.5)
HEMATOCRIT: 28.3 % — AB (ref 34.8–46.6)
HEMOGLOBIN: 8.4 g/dL — AB (ref 11.6–15.9)
Immature Retic Fract: 17.2 % — ABNORMAL HIGH (ref 1.60–10.00)
LYMPH%: 33.8 % (ref 14.0–49.7)
MCH: 25 pg — AB (ref 25.1–34.0)
MCHC: 29.7 g/dL — AB (ref 31.5–36.0)
MCV: 84.2 fL (ref 79.5–101.0)
MONO#: 0.5 10*3/uL (ref 0.1–0.9)
MONO%: 11.4 % (ref 0.0–14.0)
NEUT%: 50.3 % (ref 38.4–76.8)
NEUTROS ABS: 2.3 10*3/uL (ref 1.5–6.5)
Platelets: 213 10*3/uL (ref 145–400)
RBC: 3.36 10*6/uL — ABNORMAL LOW (ref 3.70–5.45)
RDW: 20.3 % — AB (ref 11.2–14.5)
RETIC %: 1.61 % (ref 0.70–2.10)
Retic Ct Abs: 54.1 10*3/uL (ref 33.70–90.70)
WBC: 4.5 10*3/uL (ref 3.9–10.3)
lymph#: 1.5 10*3/uL (ref 0.9–3.3)

## 2016-07-27 LAB — IRON AND TIBC
%SAT: 4 % — ABNORMAL LOW (ref 21–57)
IRON: 25 ug/dL — AB (ref 41–142)
TIBC: 554 ug/dL — AB (ref 236–444)
UIBC: 529 ug/dL — ABNORMAL HIGH (ref 120–384)

## 2016-07-27 LAB — FERRITIN: FERRITIN: 9 ng/mL (ref 9–269)

## 2016-07-27 MED ORDER — SODIUM CHLORIDE 0.9 % IV SOLN
510.0000 mg | Freq: Once | INTRAVENOUS | Status: AC
  Administered 2016-07-27: 510 mg via INTRAVENOUS
  Filled 2016-07-27: qty 17

## 2016-07-27 MED ORDER — SODIUM CHLORIDE 0.9 % IV SOLN
Freq: Once | INTRAVENOUS | Status: AC
  Administered 2016-07-27: 09:00:00 via INTRAVENOUS

## 2016-07-27 NOTE — Patient Instructions (Signed)

## 2016-07-27 NOTE — Progress Notes (Signed)
Pt tolerated feraheme infusion well. Pt monitored 30 minutes post infusion. Pt and VS stable at discharge.

## 2016-07-30 LAB — CELIAC PANEL 10
Antigliadin Abs, IgA: 4 units (ref 0–19)
Deamidated Gliadin Abs, IgG: 1 units (ref 0–19)
Endomysial IgA: NEGATIVE
IgA, Qn, Serum: 278 mg/dL (ref 87–352)
t-Transglutaminase (tTG) IgG: <2 U/mL (ref 0–5)

## 2016-08-03 ENCOUNTER — Ambulatory Visit (HOSPITAL_BASED_OUTPATIENT_CLINIC_OR_DEPARTMENT_OTHER): Payer: BLUE CROSS/BLUE SHIELD

## 2016-08-03 ENCOUNTER — Other Ambulatory Visit: Payer: BLUE CROSS/BLUE SHIELD

## 2016-08-03 ENCOUNTER — Telehealth: Payer: Self-pay | Admitting: Internal Medicine

## 2016-08-03 VITALS — BP 95/65 | HR 85 | Temp 98.3°F | Resp 18

## 2016-08-03 DIAGNOSIS — D509 Iron deficiency anemia, unspecified: Secondary | ICD-10-CM

## 2016-08-03 DIAGNOSIS — D508 Other iron deficiency anemias: Secondary | ICD-10-CM

## 2016-08-03 MED ORDER — SODIUM CHLORIDE 0.9 % IV SOLN
Freq: Once | INTRAVENOUS | Status: AC
Start: 2016-08-03 — End: 2016-08-03
  Administered 2016-08-03: 09:00:00 via INTRAVENOUS

## 2016-08-03 MED ORDER — SODIUM CHLORIDE 0.9 % IV SOLN
510.0000 mg | Freq: Once | INTRAVENOUS | Status: AC
  Administered 2016-08-03: 510 mg via INTRAVENOUS
  Filled 2016-08-03: qty 17

## 2016-08-03 NOTE — Telephone Encounter (Signed)
Nothing significant. A few erosions. Otherwise ok. Recommend avoiding all NSAIDS, stop smoking, and continue IV iron infusions and close blood count monitoring with hematology clinic

## 2016-08-03 NOTE — Telephone Encounter (Signed)
Pt calling for capsule endo results. Please advise. 

## 2016-08-03 NOTE — Patient Instructions (Signed)

## 2016-08-04 NOTE — Telephone Encounter (Signed)
Spoke with pt and she is aware.

## 2016-08-16 ENCOUNTER — Telehealth: Payer: Self-pay | Admitting: *Deleted

## 2016-08-16 ENCOUNTER — Other Ambulatory Visit: Payer: Self-pay

## 2016-08-16 DIAGNOSIS — R634 Abnormal weight loss: Secondary | ICD-10-CM

## 2016-08-16 NOTE — Telephone Encounter (Signed)
Called pt's daughter back to discuss and obtain more details.  Per Larene Beach, pt reports hypotension today with readings of 80s over 82s.  Larene Beach also reports her mom states she has had lightheadedness, trouble ambulating straight line, and overall feels very weak.  Called pt to discuss further as she is not with daughter at time of call.  Pt denies any bleeding including stool and urine.  Reviewed with Dr. Lindi Adie who feels pt should be seen for lab work and evaluation as soon as possible.  Dr. Lindi Adie recommends pt contact PCP for soonest possible evaluation; however, if unable to be seen within next day or so, Dr. Lindi Adie agrees to see pt Wednesday afternoon.  Called pt and pt daughter back to let them know of this and find out pt preference on how she would like to proceed.  Pt reports she will call PCP this afternoon to see if she can be seen sooner and let our office know if they are unable to accommodate her.  Had lengthy discussion with pt letting her know if symptoms get more severe or she feels she needs immediate evaluation, she should proceed to ED or call EMS.  Pt verbalized understanding and is without further questions or concerns at this time.  During this discussion, both pt and daughter wanted to express concern on unexplained weight loss over last several months.  Reviewed this also with Dr. Lindi Adie who is in agreement for obtaining CT CAP for further evaluation.  Pt confirmed she is in agreement with proceeding with scans and thus order entered.  I informed pt she should be hearing from someone in next few days to schedule this.  At end of conversations with both pt and her daughter, all questions were addressed and pt encouraged to contact us with any further concerns.

## 2016-08-16 NOTE — Telephone Encounter (Signed)
Daughter called stating that patient has been having low blood pressure with dizziness and lightheadedness. Patient blood pressure was 91/60 ranging down to 80s/50s. Patient has been pushing fluids and eating as much as possible. Please advise. RN should call daughter Larene Beach) at [phone].

## 2016-08-18 ENCOUNTER — Encounter (HOSPITAL_COMMUNITY): Payer: Self-pay | Admitting: *Deleted

## 2016-08-18 ENCOUNTER — Ambulatory Visit (INDEPENDENT_AMBULATORY_CARE_PROVIDER_SITE_OTHER): Payer: BLUE CROSS/BLUE SHIELD | Admitting: Physician Assistant

## 2016-08-18 ENCOUNTER — Encounter: Payer: Self-pay | Admitting: Physician Assistant

## 2016-08-18 ENCOUNTER — Observation Stay (HOSPITAL_COMMUNITY)
Admission: EM | Admit: 2016-08-18 | Discharge: 2016-08-20 | Disposition: A | Discharge status: Home / Self Care | Payer: BLUE CROSS/BLUE SHIELD | Attending: Internal Medicine | Admitting: Internal Medicine

## 2016-08-18 ENCOUNTER — Emergency Department (HOSPITAL_COMMUNITY): Payer: BLUE CROSS/BLUE SHIELD

## 2016-08-18 VITALS — BP 110/70 | HR 71 | Temp 97.7°F | Resp 14 | Ht 61.0 in | Wt 99.0 lb

## 2016-08-18 DIAGNOSIS — I10 Essential (primary) hypertension: Secondary | ICD-10-CM

## 2016-08-18 DIAGNOSIS — K219 Gastro-esophageal reflux disease without esophagitis: Secondary | ICD-10-CM | POA: Insufficient documentation

## 2016-08-18 DIAGNOSIS — Z880 Allergy status to penicillin: Secondary | ICD-10-CM | POA: Diagnosis not present

## 2016-08-18 DIAGNOSIS — K250 Acute gastric ulcer with hemorrhage: Secondary | ICD-10-CM

## 2016-08-18 DIAGNOSIS — Z888 Allergy status to other drugs, medicaments and biological substances status: Secondary | ICD-10-CM | POA: Diagnosis not present

## 2016-08-18 DIAGNOSIS — Z800 Family history of malignant neoplasm of digestive organs: Secondary | ICD-10-CM | POA: Insufficient documentation

## 2016-08-18 DIAGNOSIS — I959 Hypotension, unspecified: Secondary | ICD-10-CM | POA: Diagnosis present

## 2016-08-18 DIAGNOSIS — Z8249 Family history of ischemic heart disease and other diseases of the circulatory system: Secondary | ICD-10-CM | POA: Diagnosis not present

## 2016-08-18 DIAGNOSIS — I700 Atherosclerosis of aorta: Secondary | ICD-10-CM | POA: Insufficient documentation

## 2016-08-18 DIAGNOSIS — R471 Dysarthria and anarthria: Secondary | ICD-10-CM

## 2016-08-18 DIAGNOSIS — Z8719 Personal history of other diseases of the digestive system: Secondary | ICD-10-CM | POA: Insufficient documentation

## 2016-08-18 DIAGNOSIS — Z86010 Personal history of colonic polyps: Secondary | ICD-10-CM | POA: Diagnosis not present

## 2016-08-18 DIAGNOSIS — I6521 Occlusion and stenosis of right carotid artery: Secondary | ICD-10-CM | POA: Diagnosis not present

## 2016-08-18 DIAGNOSIS — M546 Pain in thoracic spine: Secondary | ICD-10-CM | POA: Diagnosis not present

## 2016-08-18 DIAGNOSIS — Z833 Family history of diabetes mellitus: Secondary | ICD-10-CM | POA: Diagnosis not present

## 2016-08-18 DIAGNOSIS — I6502 Occlusion and stenosis of left vertebral artery: Secondary | ICD-10-CM | POA: Diagnosis not present

## 2016-08-18 DIAGNOSIS — I9589 Other hypotension: Principal | ICD-10-CM | POA: Insufficient documentation

## 2016-08-18 DIAGNOSIS — R0600 Dyspnea, unspecified: Secondary | ICD-10-CM

## 2016-08-18 DIAGNOSIS — E559 Vitamin D deficiency, unspecified: Secondary | ICD-10-CM | POA: Diagnosis not present

## 2016-08-18 DIAGNOSIS — F17210 Nicotine dependence, cigarettes, uncomplicated: Secondary | ICD-10-CM | POA: Insufficient documentation

## 2016-08-18 DIAGNOSIS — M1990 Unspecified osteoarthritis, unspecified site: Secondary | ICD-10-CM | POA: Insufficient documentation

## 2016-08-18 DIAGNOSIS — R0789 Other chest pain: Secondary | ICD-10-CM | POA: Diagnosis not present

## 2016-08-18 DIAGNOSIS — Z823 Family history of stroke: Secondary | ICD-10-CM | POA: Insufficient documentation

## 2016-08-18 DIAGNOSIS — G459 Transient cerebral ischemic attack, unspecified: Secondary | ICD-10-CM | POA: Diagnosis present

## 2016-08-18 DIAGNOSIS — Z6820 Body mass index (BMI) 20.0-20.9, adult: Secondary | ICD-10-CM | POA: Insufficient documentation

## 2016-08-18 DIAGNOSIS — E43 Unspecified severe protein-calorie malnutrition: Secondary | ICD-10-CM | POA: Diagnosis not present

## 2016-08-18 DIAGNOSIS — Z7982 Long term (current) use of aspirin: Secondary | ICD-10-CM | POA: Insufficient documentation

## 2016-08-18 DIAGNOSIS — J449 Chronic obstructive pulmonary disease, unspecified: Secondary | ICD-10-CM | POA: Diagnosis not present

## 2016-08-18 DIAGNOSIS — D509 Iron deficiency anemia, unspecified: Secondary | ICD-10-CM | POA: Diagnosis present

## 2016-08-18 DIAGNOSIS — R4781 Slurred speech: Secondary | ICD-10-CM | POA: Diagnosis not present

## 2016-08-18 DIAGNOSIS — M810 Age-related osteoporosis without current pathological fracture: Secondary | ICD-10-CM | POA: Diagnosis not present

## 2016-08-18 DIAGNOSIS — R2689 Other abnormalities of gait and mobility: Secondary | ICD-10-CM

## 2016-08-18 DIAGNOSIS — I739 Peripheral vascular disease, unspecified: Secondary | ICD-10-CM | POA: Insufficient documentation

## 2016-08-18 DIAGNOSIS — R079 Chest pain, unspecified: Secondary | ICD-10-CM | POA: Diagnosis not present

## 2016-08-18 DIAGNOSIS — R531 Weakness: Secondary | ICD-10-CM | POA: Diagnosis not present

## 2016-08-18 DIAGNOSIS — E782 Mixed hyperlipidemia: Secondary | ICD-10-CM | POA: Diagnosis not present

## 2016-08-18 LAB — APTT: APTT: 35 s (ref 24–36)

## 2016-08-18 LAB — DIFFERENTIAL
BASOS ABS: 0 10*3/uL (ref 0.0–0.1)
Basophils Relative: 0 %
EOS ABS: 0.1 10*3/uL (ref 0.0–0.7)
EOS PCT: 1 %
LYMPHS ABS: 1.1 10*3/uL (ref 0.7–4.0)
Lymphocytes Relative: 16 %
MONO ABS: 0.4 10*3/uL (ref 0.1–1.0)
Monocytes Relative: 5 %
NEUTROS PCT: 78 %
Neutro Abs: 5.5 10*3/uL (ref 1.7–7.7)

## 2016-08-18 LAB — COMPREHENSIVE METABOLIC PANEL
ALBUMIN: 4 g/dL (ref 3.5–5.0)
ALT: 8 U/L — ABNORMAL LOW (ref 14–54)
ANION GAP: 11 (ref 5–15)
AST: 16 U/L (ref 15–41)
Alkaline Phosphatase: 53 U/L (ref 38–126)
BUN: 18 mg/dL (ref 6–20)
CHLORIDE: 110 mmol/L (ref 101–111)
CO2: 21 mmol/L — AB (ref 22–32)
Calcium: 9.8 mg/dL (ref 8.9–10.3)
Creatinine, Ser: 0.72 mg/dL (ref 0.44–1.00)
GFR calc Af Amer: >60 mL/min (ref >60)
GFR calc non Af Amer: >60 mL/min (ref >60)
GLUCOSE: 99 mg/dL (ref 65–99)
POTASSIUM: 5.1 mmol/L (ref 3.5–5.1)
SODIUM: 142 mmol/L (ref 135–145)
TOTAL PROTEIN: 7 g/dL (ref 6.5–8.1)
Total Bilirubin: 0.3 mg/dL (ref 0.3–1.2)

## 2016-08-18 LAB — CBC
HEMATOCRIT: 24.7 % — AB (ref 36.0–46.0)
Hemoglobin: 8.4 g/dL — ABNORMAL LOW (ref 12.0–15.0)
MCH: 31.5 pg (ref 26.0–34.0)
MCHC: 34 g/dL (ref 30.0–36.0)
MCV: 92.5 fL (ref 78.0–100.0)
Platelets: 145 10*3/uL — ABNORMAL LOW (ref 150–400)
RBC: 2.67 MIL/uL — ABNORMAL LOW (ref 3.87–5.11)
RDW: 13.4 % (ref 11.5–15.5)
WBC: 7.1 10*3/uL (ref 4.0–10.5)

## 2016-08-18 LAB — URINALYSIS, ROUTINE W REFLEX MICROSCOPIC
BILIRUBIN URINE: NEGATIVE
Glucose, UA: NEGATIVE mg/dL
Hgb urine dipstick: NEGATIVE
Ketones, ur: NEGATIVE mg/dL
LEUKOCYTES UA: NEGATIVE
NITRITE: NEGATIVE
Protein, ur: NEGATIVE mg/dL
SPECIFIC GRAVITY, URINE: 1.01 (ref 1.005–1.030)
pH: 5 (ref 5.0–8.0)

## 2016-08-18 LAB — I-STAT CHEM 8, ED
BUN: 23 mg/dL — AB (ref 6–20)
CALCIUM ION: 1.07 mmol/L — AB (ref 1.15–1.40)
CHLORIDE: 114 mmol/L — AB (ref 101–111)
Creatinine, Ser: 0.7 mg/dL (ref 0.44–1.00)
Glucose, Bld: 91 mg/dL (ref 65–99)
HEMATOCRIT: 39 % (ref 36.0–46.0)
Hemoglobin: 13.3 g/dL (ref 12.0–15.0)
POTASSIUM: 5.1 mmol/L (ref 3.5–5.1)
SODIUM: 142 mmol/L (ref 135–145)
TCO2: 22 mmol/L (ref 0–100)

## 2016-08-18 LAB — TROPONIN I

## 2016-08-18 LAB — I-STAT TROPONIN, ED: Troponin i, poc: 0 ng/mL (ref 0.00–0.08)

## 2016-08-18 LAB — I-STAT CG4 LACTIC ACID, ED: LACTIC ACID, VENOUS: 0.43 mmol/L — AB (ref 0.5–1.9)

## 2016-08-18 LAB — PROTIME-INR
INR: 1.34
Prothrombin Time: 16.6 seconds — ABNORMAL HIGH (ref 11.4–15.2)

## 2016-08-18 LAB — CBG MONITORING, ED: GLUCOSE-CAPILLARY: 116 mg/dL — AB (ref 65–99)

## 2016-08-18 MED ORDER — ENOXAPARIN SODIUM 30 MG/0.3ML ~~LOC~~ SOLN
30.0000 mg | SUBCUTANEOUS | Status: DC
  Administered 2016-08-18 – 2016-08-19 (×2): 30 mg via SUBCUTANEOUS
  Filled 2016-08-18 (×2): qty 0.3

## 2016-08-18 MED ORDER — STROKE: EARLY STAGES OF RECOVERY BOOK
Freq: Once | Status: AC
  Administered 2016-08-18: 19:00:00
  Filled 2016-08-18: qty 1

## 2016-08-18 MED ORDER — ALBUTEROL SULFATE HFA 108 (90 BASE) MCG/ACT IN AERS: 2.0000 | INHALATION_SPRAY | Freq: Four times a day (QID) | RESPIRATORY_TRACT | Status: DC | PRN

## 2016-08-18 MED ORDER — EZETIMIBE 10 MG PO TABS
10.0000 mg | ORAL_TABLET | Freq: Every day | ORAL | Status: DC
  Administered 2016-08-19 – 2016-08-20 (×2): 10 mg via ORAL
  Filled 2016-08-18 (×2): qty 1

## 2016-08-18 MED ORDER — ACETAMINOPHEN 500 MG PO TABS
1000.0000 mg | ORAL_TABLET | Freq: Four times a day (QID) | ORAL | Status: DC | PRN
  Administered 2016-08-18 – 2016-08-20 (×6): 1000 mg via ORAL
  Filled 2016-08-18 (×6): qty 2

## 2016-08-18 MED ORDER — SODIUM CHLORIDE 0.9 % IV SOLN
Freq: Once | INTRAVENOUS | Status: AC
  Administered 2016-08-18: 17:00:00 via INTRAVENOUS

## 2016-08-18 MED ORDER — IOPAMIDOL (ISOVUE-370) INJECTION 76%
INTRAVENOUS | Status: AC
  Administered 2016-08-18: 100 mL
  Filled 2016-08-18: qty 100

## 2016-08-18 MED ORDER — SODIUM CHLORIDE 0.9 % IV SOLN
INTRAVENOUS | Status: AC
  Administered 2016-08-18: 20:00:00 via INTRAVENOUS

## 2016-08-18 MED ORDER — PANTOPRAZOLE SODIUM 40 MG PO TBEC
40.0000 mg | DELAYED_RELEASE_TABLET | Freq: Every day | ORAL | Status: DC
  Administered 2016-08-19 – 2016-08-20 (×2): 40 mg via ORAL
  Filled 2016-08-18 (×2): qty 1

## 2016-08-18 MED ORDER — ALBUTEROL SULFATE (2.5 MG/3ML) 0.083% IN NEBU: 2.5000 mg | INHALATION_SOLUTION | Freq: Four times a day (QID) | RESPIRATORY_TRACT | Status: DC | PRN

## 2016-08-18 MED ORDER — ASPIRIN EC 81 MG PO TBEC
81.0000 mg | DELAYED_RELEASE_TABLET | Freq: Every day | ORAL | Status: DC
  Administered 2016-08-19 – 2016-08-20 (×2): 81 mg via ORAL
  Filled 2016-08-18 (×2): qty 1

## 2016-08-18 MED ORDER — MOMETASONE FURO-FORMOTEROL FUM 200-5 MCG/ACT IN AERO
2.0000 | INHALATION_SPRAY | Freq: Two times a day (BID) | RESPIRATORY_TRACT | Status: DC
  Administered 2016-08-19 – 2016-08-20 (×3): 2 via RESPIRATORY_TRACT
  Filled 2016-08-18 (×2): qty 8.8

## 2016-08-18 NOTE — Progress Notes (Addendum)
Patient with BP 80/40, rechecked bilaterally; asymptomatic, on call paged. Waiting for page back. Continue to monitor. BP manually 86/46; orders received for 58ml bolus. Continue to monitor.

## 2016-08-18 NOTE — Progress Notes (Signed)
Subjective:    Patient ID: Ruben Reason, female    DOB: 08-Apr-1956, 60 y.o.   MRN: [id number]  HPI 60 y.o. WF with history of COPD, HTN, carotid stenosis s/p CEA, gastric ulcer with history of anemia with 2 iron infusions, last one 3 weeks ago at hematology center, malnutrition presents with dizziness, imbalance, weakness x 7-10 days.   She first noticed she was staggering and having imbalance, has had low BP in the 80's, drinking pickle juice but has been low until today. She has pain between her shoulders with worsening dyspnea with exertion, she has urinary frequency, last night she was having trouble finding words/dysarthria but states it has resolved today. She is still on prilosec twice daily for gastric ulcers, had pill endoscopy with Dr. Marina Goodell that is normal.   Blood pressure 110/70, pulse 71, temperature 97.7 F (36.5 C), resp. rate 14, height 5\' 1"  (1.549 m), weight 99 lb (44.9 kg), SpO2 99 %.  Medications Current Outpatient Prescriptions on File Prior to Visit  Medication Sig  . acetaminophen (TYLENOL) 500 MG tablet Take 500 mg by mouth every 6 (six) hours as needed for moderate pain or headache.   . albuterol (PROVENTIL HFA;VENTOLIN HFA) 108 (90 BASE) MCG/ACT inhaler Inhale 2 puffs into the lungs every 6 (six) hours as needed for wheezing or shortness of breath.  Marland Kitchen aspirin EC 81 MG tablet Take 1 tablet (81 mg total) by mouth daily.  . cetirizine (ZYRTEC) 10 MG tablet Take 10 mg by mouth as needed for allergies.  . Cholecalciferol (VITAMIN D) 2000 UNITS CAPS Take 7000 units a day   . dicyclomine (BENTYL) 10 MG capsule Take 1 capsule (10 mg total) by mouth 3 (three) times daily before meals.  Marland Kitchen ezetimibe (ZETIA) 10 MG tablet Take 1 tablet (10 mg total) by mouth daily.  Marland Kitchen FeFum-FePoly-FA-B Cmp-C-Biot (INTEGRA PLUS) CAPS Take 1 capsule by mouth daily.  . Fluticasone-Salmeterol (ADVAIR DISKUS) 250-50 MCG/DOSE AEPB Inhale 1 puff into the lungs every 12 (twelve) hours.  Marland Kitchen omeprazole  (PRILOSEC) 40 MG capsule TAKE 1 CAPSULE (40 MG TOTAL) BY MOUTH 2 (TWO) TIMES DAILY.   No current facility-administered medications on file prior to visit.     Problem list She has COLONIC POLYPS; Mixed hyperlipidemia; Internal hemorrhoids; GERD; Gastric ulcer; Duodenal ulcer without hemorrhage or perforation and without obstruction; COPD (chronic obstructive pulmonary disease) (HCC); Asthma; Vitamin D deficiency; HTN (hypertension); Abnormal glucose; Medication management; Carotid stenosis, asymptomatic; Osteoporosis; Depression, major, in remission (HCC); Mild malnutrition (HCC); Anemia; and Iron deficiency anemia on her problem list.    Review of Systems  Constitutional: Positive for activity change, appetite change and fatigue. Negative for chills, diaphoresis, fever and unexpected weight change.  HENT: Negative.   Eyes: Negative.   Respiratory: Positive for shortness of breath (with exertion).   Cardiovascular: Negative for chest pain, palpitations and leg swelling.  Gastrointestinal: Negative.   Genitourinary: Positive for frequency.  Musculoskeletal: Positive for back pain (between shoulder blades).  Skin: Positive for pallor.  Neurological: Positive for dizziness.       Objective:   Physical Exam  Constitutional: She is oriented to person, place, and time. She appears well-developed and well-nourished. No distress.  HENT:  Head: Normocephalic and atraumatic.  Mouth/Throat: No oropharyngeal exudate.  Eyes: Conjunctivae and EOM are normal. Pupils are equal, round, and reactive to light.  Neck: Normal range of motion. Neck supple.  Cardiovascular: Regular rhythm, normal heart sounds and intact distal pulses.   No murmur heard. Tachycardia,  equal BP bilateral arms  Pulmonary/Chest: Effort normal and breath sounds normal. No respiratory distress. She has no wheezes.  Abdominal: Soft. Bowel sounds are normal. She exhibits no distension. There is tenderness (epigastric, ? pusitile  mass- with right lower quadrant pain and right flank pain). There is guarding. There is no rebound.  Musculoskeletal: Normal range of motion. She exhibits no edema or tenderness.  Neurological: She is alert and oriented to person, place, and time. She displays no atrophy. A cranial nerve deficit (questionable left tongue deviation) is present. No sensory deficit. She exhibits normal muscle tone. Gait (imbalance) abnormal. Coordination normal.  Skin: Skin is warm and dry. No rash noted.      Assessment & Plan:  Imbalance with potential dysarthria/tongue deviation off anticoagulation due to anemia/gastric ulcer Will send to ER to rule out TIA/stroke, need labs checked for anemia/electrolyte abnormality  Dyspnea worse with shoulder blade pain- check labs, rule out ACS/demand  Daughter here and will take patient to ER via private vehicle.

## 2016-08-18 NOTE — Progress Notes (Signed)
Patient arrived from ED to 5M20. Safety precautions and orders reviewed with patient. TELE applied and confirmed. VSS. Will continue to monitor.   Ave Filter, RN

## 2016-08-18 NOTE — Progress Notes (Addendum)
Patient with c/o headache "it has been hurting all day." On call paged 2020. Waiting for return page. Continue to monitor. Tylenol 1000mg  q6h PRN ordered. Continue to monitor.

## 2016-08-18 NOTE — ED Notes (Signed)
Patient transported to CT 

## 2016-08-18 NOTE — ED Triage Notes (Addendum)
Pt sen there from pcp office. Pt has multiple complaints. Reports slurred speech and drooling that started yesterday. Having sob, unsteady gait and pain between her shoulder blades. Reports mid chest pains, having recent cough. No facial droop noted, grips are equal at triage.

## 2016-08-18 NOTE — ED Notes (Signed)
Pts NIH was 1, she stated that she is 107. She passed her swallow screen. Pt is getting 1 liter of fluid, rate of 500 ml/hr. Pts next neuro check is at 1800. Pt is alert and oriented, pt is continent, pt wears glasses. Pt was orthostatic upon standing.

## 2016-08-18 NOTE — ED Notes (Signed)
MD at bedside. 

## 2016-08-18 NOTE — H&P (Signed)
History and Physical   [name] ZOX:[id number] DOB: Nov 30, 1955 DOA: 08/18/2016  Referring MD/NP/PA: Dr. Ranae Palms, EDP PCP: Nadean Corwin, MD Outpatient Specialists: Dr. Marina Goodell, GI  Patient coming from: Home  Chief Complaint: Gait imbalance, slurred speech, drooling  HPI: [name] is a 60 y.o. female with a history of COPD, tobacco use, HTN, HLD, carotid stenosis s/p CEA 2016, gastric ulcer and chronic iron-deficiency anemia who was sent from her PCP for CVA evaluation.   She notes sudden onset at 6:00pm yesterday of midthoracic back pain while rising from seated position, associated with left chest pain and light-headedness. She has chronic dyspnea on exertion but notes none at this time. She later noticed dysarthria, left facial droop with drooling, and stumbling gait. These symptoms have waxed and waned, eventually largely resolving by the time of evaluation at PCP's office this morning. She was sent for concern of CVA. She also noted several weeks of generalized weakness and low blood pressures worsening in the past week (80's systolic) at home. She has had decreased appetite and chronic weight loss as well.   ED Course: On arrival the patient was in no distress, still complaining of some speech disturbance without other focal neurological deficits or chest pain or back pain. She was afebrile, hypotensive (98/46) with HR 79bpm. Right carotid bruit was noted and neurological exam was normal with the exception of subtle right lower extremity weakness per EDP. CT head showed no acute abnormalities. Further imaging ruled out aortic dissection as a cause of chest and thoracic back pain. BPs remained low and TR[facility] asked to admit for TIA work up.   Review of Systems: No fevers, and per HPI. All others reviewed and are negative.   Past Medical History:  Diagnosis Date  . Allergy   . Anemia   . Arthritis   . Asthma   . Blood transfusion without reported diagnosis   . Cataract     . COPD (chronic obstructive pulmonary disease) (HCC)   . Duodenal ulcer   . Emphysema of lung (HCC)   . Family history of malignant neoplasm of gastrointestinal tract   . GERD (gastroesophageal reflux disease)   . Hepatitis    had in 6th grade pt unsure which kind  . Hiatal hernia   . HTN (hypertension) 10/03/2014  . Hx of colonic polyps   . Hyperlipemia   . Internal hemorrhoids   . Osteoporosis   . Peripheral vascular disease (HCC)   . Pneumonia    hx of  . Shortness of breath dyspnea    with ambulation  . Vitamin D deficiency     Past Surgical History:  Procedure Laterality Date  . ABDOMINAL HYSTERECTOMY    . APPENDECTOMY    . CATARACT EXTRACTION, BILATERAL    . COLONOSCOPY    . ELBOW SURGERY Right   . ENDARTERECTOMY Right 12/16/2014   Procedure: ENDARTERECTOMY CAROTID with Dacron patch;  Surgeon: Larina Earthly, MD;  Location: [facility] OR;  Service: Vascular;  Laterality: Right;  . ESOPHAGOGASTRODUODENOSCOPY  10/2008  . HAND SURGERY Left   . LUMBAR DISC SURGERY    . TOTAL HIP ARTHROPLASTY Right   . trigeminal neuralgia surgery    . UPPER GASTROINTESTINAL ENDOSCOPY    . WRIST SURGERY Left    - Active everyday smoker, less than monthly EtOH, lives in house with husband who works. Fully independent with ADLs.  Allergies  Allergen Reactions  . Celebrex [Celecoxib]     GI bleed  . Crestor [Rosuvastatin]  Made legs hurt  . Penicillins     REACTION: whelps  . Pravastatin     Muscle aches    Family History  Problem Relation Age of Onset  . Hypertension Father   . Hyperlipidemia Father   . Heart disease Father   . Heart attack Father   . Colon polyps Mother   . Inflammatory bowel disease Mother   . Hypertension Mother   . Stroke Mother   . Heart disease Mother   . Hyperlipidemia Mother   . Varicose Veins Mother   . Heart attack Mother   . AAA (abdominal aortic aneurysm) Mother   . Diabetes Maternal Grandfather   . Heart disease Maternal Grandfather   . Heart  disease Maternal Grandmother   . Colon cancer      mat great aunt  . Heart disease Brother   . Hyperlipidemia Brother   . Hypertension Brother   . Heart attack Brother   . AAA (abdominal aortic aneurysm) Brother   . Varicose Veins Daughter   . Esophageal cancer Neg Hx   . Stomach cancer Neg Hx   . Rectal cancer Neg Hx    - Family history strongly positive for early CVD and strokes, otherwise reviewed and not pertinent.   Prior to Admission medications   Medication Sig Start Date End Date Taking? Authorizing Provider  acetaminophen (TYLENOL) 500 MG tablet Take 500 mg by mouth every 6 (six) hours as needed for moderate pain or headache.    Yes Historical Provider, MD  albuterol (PROVENTIL HFA;VENTOLIN HFA) 108 (90 BASE) MCG/ACT inhaler Inhale 2 puffs into the lungs every 6 (six) hours as needed for wheezing or shortness of breath. 11/16/13  Yes Loree Fee, PA-C  aspirin EC 81 MG tablet Take 1 tablet (81 mg total) by mouth daily. 12/16/14  Yes Raymond Gurney, PA-C  cetirizine (ZYRTEC) 10 MG tablet Take 10 mg by mouth as needed for allergies.   Yes Historical Provider, MD  Cholecalciferol (VITAMIN D) 2000 UNITS CAPS Take 7000 units a day    Yes Historical Provider, MD  ezetimibe (ZETIA) 10 MG tablet Take 1 tablet (10 mg total) by mouth daily. 07/09/15  Yes Quentin Mulling, PA-C  Fluticasone-Salmeterol (ADVAIR DISKUS) 250-50 MCG/DOSE AEPB Inhale 1 puff into the lungs every 12 (twelve) hours.   Yes Historical Provider, MD  omeprazole (PRILOSEC) 40 MG capsule TAKE 1 CAPSULE (40 MG TOTAL) BY MOUTH 2 (TWO) TIMES DAILY. 06/21/16  Yes [name], MD  dicyclomine (BENTYL) 10 MG capsule Take 1 capsule (10 mg total) by mouth 3 (three) times daily before meals. Patient not taking: Reported on 08/18/2016 12/10/15   Amy S Esterwood, PA-C  FeFum-FePoly-FA-B Cmp-C-Biot (INTEGRA PLUS) CAPS Take 1 capsule by mouth daily. Patient not taking: Reported on 08/18/2016 12/10/15   Sammuel Cooper, PA-C     Physical Exam: Vitals:   08/18/16 1330 08/18/16 1400 08/18/16 1415 08/18/16 1627  BP: (!) 120/54 103/56 (!) 98/46   Pulse: 86 79    Resp: 14 13 15    Temp:    98.6 F (37 C)  TempSrc:      SpO2: 99% 99%    Weight:       Constitutional: 60 y.o. female in no distress, pleasanat demeanor. Smells faintly of tobacco. Eyes: Lids and conjunctivae normal, PERRL ENMT: Mucous membranes are moist. Posterior pharynx clear of any exudate or lesions. Fair dentition.  Neck: normal, supple, no masses, no thyromegaly Respiratory: Non-labored breathing without accessory muscle use. Clear, distant  breath sounds to auscultation bilaterally Cardiovascular: Regular rate and rhythm, no murmurs, rubs, or gallops. + R carotid bruit. No JVD. No LE edema. 2+ bilateral pedal pulses. Abdomen: Normoactive bowel sounds. No tenderness, non-distended, and no masses palpated. No hepatosplenomegaly. GU: No indwelling catheter Musculoskeletal: No clubbing / cyanosis. No joint deformity upper and lower extremities. Good ROM, no contractures. Normal muscle tone.  Skin: Warm, dry. No rashes, wounds, no ulcers. No significant lesions noted.  Neurologic: Very faint left upper lip droop with smile, otherwise CN II-XII grossly intact. Gait slow with narrow base. Speech methodical without gross dysarthria/aphasia. No focal deficits in motor strength or sensation in all extremities.  Psychiatric: Alert and oriented x3. Normal judgment and insight. Mood euthymic with congruent affect.   Labs on Admission: I have personally reviewed following labs and imaging studies  CBC:  Recent Labs Lab 08/18/16 1205 08/18/16 1242  WBC 7.1  --   NEUTROABS 5.5  --   HGB 8.4* 13.3  HCT 24.7* 39.0  MCV 92.5  --   PLT 145*  --    Basic Metabolic Panel:  Recent Labs Lab 08/18/16 1205 08/18/16 1242  NA 142 142  K 5.1 5.1  CL 110 114*  CO2 21*  --   GLUCOSE 99 91  BUN 18 23*  CREATININE 0.72 0.70  CALCIUM 9.8  --     GFR: Estimated Creatinine Clearance: 54.3 mL/min (by C-G formula based on SCr of 0.7 mg/dL). Liver Function Tests:  Recent Labs Lab 08/18/16 1205  AST 16  ALT 8*  ALKPHOS 53  BILITOT 0.3  PROT 7.0  ALBUMIN 4.0   No results for input(s): LIPASE, AMYLASE in the last 168 hours. No results for input(s): AMMONIA in the last 168 hours. Coagulation Profile:  Recent Labs Lab 08/18/16 1205  INR 1.34   Cardiac Enzymes: No results for input(s): CKTOTAL, CKMB, CKMBINDEX, TROPONINI in the last 168 hours. BNP (last 3 results) No results for input(s): PROBNP in the last 8760 hours. HbA1C: No results for input(s): HGBA1C in the last 72 hours. CBG:  Recent Labs Lab 08/18/16 1610  GLUCAP 116*   Lipid Profile: No results for input(s): CHOL, HDL, LDLCALC, TRIG, CHOLHDL, LDLDIRECT in the last 72 hours. Thyroid Function Tests: No results for input(s): TSH, T4TOTAL, FREET4, T3FREE, THYROIDAB in the last 72 hours. Anemia Panel: No results for input(s): VITAMINB12, FOLATE, FERRITIN, TIBC, IRON, RETICCTPCT in the last 72 hours. Urine analysis:    Component Value Date/Time   COLORURINE YELLOW 08/18/2016 1418   APPEARANCEUR CLEAR 08/18/2016 1418   LABSPEC 1.010 08/18/2016 1418   PHURINE 5.0 08/18/2016 1418   GLUCOSEU NEGATIVE 08/18/2016 1418   HGBUR NEGATIVE 08/18/2016 1418   BILIRUBINUR NEGATIVE 08/18/2016 1418   KETONESUR NEGATIVE 08/18/2016 1418   PROTEINUR NEGATIVE 08/18/2016 1418   UROBILINOGEN 0.2 04/22/2015 0931   NITRITE NEGATIVE 08/18/2016 1418   LEUKOCYTESUR NEGATIVE 08/18/2016 1418   Sepsis Labs: @LABRCNTIP (procalcitonin:4,lacticidven:4) )No results found for this or any previous visit (from the past 240 hour(s)).   Radiological Exams on Admission: Ct Angio Head W Or Wo Contrast  Result Date: 08/18/2016 CLINICAL DATA:  Slurred speech and drooling.  Chest pain EXAM: CT ANGIOGRAPHY HEAD AND NECK TECHNIQUE: Multidetector CT imaging of the head and neck was performed  using the standard protocol during bolus administration of intravenous contrast. Multiplanar CT image reconstructions and MIPs were obtained to evaluate the vascular anatomy. Carotid stenosis measurements (when applicable) are obtained utilizing NASCET criteria, using the distal internal carotid diameter  as the denominator. CONTRAST:  100 mL Isovue 370 IV COMPARISON:  CT head 08/18/2016 FINDINGS: CTA NECK FINDINGS Aortic arch: Minimal atherosclerotic calcification aortic arch. Negative for aneurysm or dissection. Proximal great vessels widely patent. Right carotid system: Normal right carotid without significant atherosclerotic disease or stenosis. No dissection. Left carotid system: Mild atherosclerotic calcification in the carotid bulb without significant stenosis. Vertebral arteries: Left vertebral artery dominant. Mild stenosis at the origin. Mild atherosclerotic calcification distal left vertebral artery without significant stenosis Nondominant right vertebral artery. Mild to moderate stenosis at the origin. Right vertebral artery is patent through the remainder of its course and contributes to the basilar. Skeleton: Negative Other neck: Negative Upper chest: Chest CT from today reported separately Review of the MIP images confirms the above findings CTA HEAD FINDINGS Anterior circulation: Mild atherosclerotic calcification in the cavernous carotid bilaterally. No significant stenosis of the carotid artery through the cavernous segment. Anterior and middle cerebral arteries widely patent bilaterally without stenosis or occlusion. Posterior circulation: Both vertebral arteries patent to the basilar. PICA patent bilaterally. Right AICA patent. Basilar widely patent. Superior cerebellar and posterior cerebral arteries patent bilaterally. Venous sinuses: Widely patent. Anatomic variants: None Delayed phase: Normal enhancement on delayed imaging. Negative for mass lesion. Review of the MIP images confirms the above  findings IMPRESSION: Carotid artery widely patent bilaterally without significant stenosis. Mild atherosclerotic disease at the left carotid bifurcation and in the cavernous segment bilaterally Mild stenosis at the origin the left vertebral artery and mild to moderate stenosis at the origin of the right vertebral artery. No significant intracranial stenosis. Electronically Signed   By: Marlan Palau M.D.   On: 08/18/2016 15:52   Ct Head Wo Contrast  Result Date: 08/18/2016 CLINICAL DATA:  Blurry vision Celsius, slurred speech EXAM: CT HEAD WITHOUT CONTRAST TECHNIQUE: Contiguous axial images were obtained from the base of the skull through the vertex without intravenous contrast. COMPARISON:  11/11/2014 FINDINGS: Brain: No intracranial hemorrhage, mass effect or midline shift. No acute cortical infarction. No mass lesion is noted on this unenhanced scan. Vascular: Mild atherosclerotic calcifications of carotid siphon. Skull: No skull fracture is noted. Sinuses/Orbits: No acute finding. Other: None IMPRESSION: No acute intracranial abnormality. No significant change. No definite acute cortical infarction. Electronically Signed   By: Natasha Mead M.D.   On: 08/18/2016 13:26   Ct Angio Neck W And/or Wo Contrast  Result Date: 08/18/2016 CLINICAL DATA:  Slurred speech and drooling.  Chest pain EXAM: CT ANGIOGRAPHY HEAD AND NECK TECHNIQUE: Multidetector CT imaging of the head and neck was performed using the standard protocol during bolus administration of intravenous contrast. Multiplanar CT image reconstructions and MIPs were obtained to evaluate the vascular anatomy. Carotid stenosis measurements (when applicable) are obtained utilizing NASCET criteria, using the distal internal carotid diameter as the denominator. CONTRAST:  100 mL Isovue 370 IV COMPARISON:  CT head 08/18/2016 FINDINGS: CTA NECK FINDINGS Aortic arch: Minimal atherosclerotic calcification aortic arch. Negative for aneurysm or dissection. Proximal  great vessels widely patent. Right carotid system: Normal right carotid without significant atherosclerotic disease or stenosis. No dissection. Left carotid system: Mild atherosclerotic calcification in the carotid bulb without significant stenosis. Vertebral arteries: Left vertebral artery dominant. Mild stenosis at the origin. Mild atherosclerotic calcification distal left vertebral artery without significant stenosis Nondominant right vertebral artery. Mild to moderate stenosis at the origin. Right vertebral artery is patent through the remainder of its course and contributes to the basilar. Skeleton: Negative Other neck: Negative Upper chest: Chest CT from  today reported separately Review of the MIP images confirms the above findings CTA HEAD FINDINGS Anterior circulation: Mild atherosclerotic calcification in the cavernous carotid bilaterally. No significant stenosis of the carotid artery through the cavernous segment. Anterior and middle cerebral arteries widely patent bilaterally without stenosis or occlusion. Posterior circulation: Both vertebral arteries patent to the basilar. PICA patent bilaterally. Right AICA patent. Basilar widely patent. Superior cerebellar and posterior cerebral arteries patent bilaterally. Venous sinuses: Widely patent. Anatomic variants: None Delayed phase: Normal enhancement on delayed imaging. Negative for mass lesion. Review of the MIP images confirms the above findings IMPRESSION: Carotid artery widely patent bilaterally without significant stenosis. Mild atherosclerotic disease at the left carotid bifurcation and in the cavernous segment bilaterally Mild stenosis at the origin the left vertebral artery and mild to moderate stenosis at the origin of the right vertebral artery. No significant intracranial stenosis. Electronically Signed   By: Marlan Palau M.D.   On: 08/18/2016 15:52   Ct Angio Chest Aorta W And/or Wo Contrast  Result Date: 08/18/2016 CLINICAL DATA:   60 year old female with intermittent left chest pain for 1 week. Pain radiating to the left shoulder blade. Initial encounter. EXAM: CT ANGIOGRAPHY CHEST WITH CONTRAST TECHNIQUE: Multidetector CT imaging of the chest was performed using the standard protocol during bolus administration of intravenous contrast. Multiplanar CT image reconstructions and MIPs were obtained to evaluate the vascular anatomy. CONTRAST:  100 mL Isovue 370 COMPARISON:  Chest CTA 11/19/2014 FINDINGS: Cardiovascular: Good contrast bolus timing in the aorta and also in the pulmonary arterial system. No pericardial effusion. Calcified aortic atherosclerosis. Calcified coronary artery atherosclerosis. No thoracic aortic dissection or aneurysm. Proximal great vessels are patent. No focal filling defect identified in the pulmonary arteries to suggest acute pulmonary embolism. Mediastinum/Nodes: No lymphadenopathy. Lungs/Pleura: Minimal retained secretions in the right mainstem bronchus. Mild bilateral lower lobe peribronchial thickening. Right greater than left lower lobe curvilinear opacity which most resembles subsegmental atelectasis. Mild depending ground-glass opacity also felt due to atelectasis. No other abnormal pulmonary opacity. No pleural effusion. Upper Abdomen: No proximal abdominal aortic dissection or aneurysm. Calcified atherosclerosis at the origins of the abdominal aortic branches. Negative visualized liver, gallbladder, spleen, pancreas, adrenal glands, kidneys, and bowel in the upper abdomen. Musculoskeletal: Chronic posterior lateral left seventh through ninth rib fractures. No acute osseous abnormality identified. Review of the MIP images confirms the above findings. IMPRESSION: 1. No thoracic aortic dissection or aneurysm. No evidence of acute pulmonary embolus. Calcified atherosclerosis of the aorta and coronary arteries. 2. Bilateral lower lobe peribronchial thickening which may be inflammatory. Scattered lower lobe  subsegmental atelectasis. 3. Chronic posterior left rib fractures. Electronically Signed   By: Odessa Fleming M.D.   On: 08/18/2016 15:49    EKG: Independently reviewed. NSR with vent rate 94. RAD. QTc . No definite ST depression/elevation.  Assessment/Plan Active Problems:   Left-sided weakness   TIA: With history of HTN, HLD, tobacco, PAD (carotid stenosis s/p CEA 2016). Symptoms concerning for watershed infarct in setting of systemic hypotension.  - Neurology consulted at admission. - Cardiac monitoring - MRI/MRA of brain w/o contrast  - 2D echocardiogram  - CTA neck and head performed: No significant intracranial stenosis. Carotid artery widely patent bilaterally without significant stenosis. Mild atherosclerotic disease at the left carotid bifurcation and in the cavernous segment bilaterally. Mild stenosis at the origin the left vertebral artery and mild to moderate stenosis at the origin of the right vertebral artery. - Neuro checks per protocol - PT/OT/SLP: Early mobilization -  Continue ASA - Consider high intensity statin - Permissive HTN: still hypotensive. - Hb A1c 4.8% in Sept 2017, TSH 1.51, lipids (LDL 92, HDL 34)  Hypotension: No indication of sepsis. Not truly orthostatic at admission (101/56 > 90/57). UA neg. No concern for pneumonia on imaging. No skin findings.  - NS bolus now, continue IVF at 75cc/hr and observe vital signs - AM cortisol  Chest pain: Atypical. Initial ECG nonischemic. Significant RFs for CAD as above including +FH. Initial troponin neg.  - R/o ACS with troponins and repeat ECG in AM.  - Telemetry - Oxygen prn - Heart healthy diet once passes bedside swallow screen  Iron-deficiency anemia: Seen at Heme/Onc clinic.  - Hgb 8.4 > 13.3?? No evidence of bleeding.  - Monitor CBC  COPD: No acute exacerbation.  - Continue home ICS/LABA and albuterol  History of HTN: Not currently on medications.  - Monitor  Hyperlipidemia: Recent LDL 92.  -  Continue zetia  History of gastric ulcer: With recent capsule endoscopy by Dr. Marina Goodell normal.  - Continue PPI  DVT prophylaxis: Lovenox  Code Status: Full confirmed at admission  Family Communication: Daughter at bedside Disposition Plan: Telemetry, observation for TIA work up and ACS rule out. Consults called: Neurology  Admission status: Observation    Hazeline Junker, MD [facility] Pager [phone]  If 7PM-7AM, please contact night-coverage [email or web address] Password [email or web address] 08/18/2016, 4:59 PM

## 2016-08-18 NOTE — ED Notes (Signed)
Dr Yelverton at bedside.  

## 2016-08-18 NOTE — ED Provider Notes (Signed)
[facility] DEPT Provider Note   CSN: [id number] Arrival date & time: 08/18/16  1153     History   Chief Complaint Chief Complaint  Patient presents with  . Gait Problem  . Shortness of Breath  . Aphasia    HPI [name] is a 60 y.o. female.  HPI Patient presents with acute onset lightheadedness and thoracic back pain starting yesterday evening around 6:30. States that symptoms began when she tried to sit up from the couch. She became lightheaded and felt she would pass out. She then developed left-sided weakness, left-sided facial droop and drooling. She had an unsteady gait and slurred speech. States that the weakness has improved though she is feels her speech is still not at baseline. She has episodic chest pain but denies any currently. Denies any thoracic back pain currently. He has mild shortness of breath and occasional cough that is nonproductive. No fever or chills. Admits to urinary frequency but thinks this is because she drinks much fluid. With her primary physician's office today and referred to the emergency department for questional TIA versus CVA. Past Medical History:  Diagnosis Date  . Allergy   . Anemia   . Arthritis   . Asthma   . Blood transfusion without reported diagnosis   . Cataract   . COPD (chronic obstructive pulmonary disease) ([place])   . Duodenal ulcer   . Emphysema of lung ([place])   . Family history of malignant neoplasm of gastrointestinal tract   . GERD (gastroesophageal reflux disease)   . Hepatitis    had in 6th grade pt unsure which kind  . Hiatal hernia   . HTN (hypertension) 10/03/2014  . Hx of colonic polyps   . Hyperlipemia   . Internal hemorrhoids   . Osteoporosis   . Peripheral vascular disease ([place])   . Pneumonia    hx of  . Shortness of breath dyspnea    with ambulation  . Vitamin D deficiency     Patient Active Problem List   Diagnosis Date Noted  . Left-sided weakness 08/18/2016  . Iron deficiency anemia  07/20/2016  . Anemia 06/25/2016  . Mild malnutrition ([place]) 06/23/2016  . Osteoporosis 01/02/2015  . Depression, major, in remission ([place]) 01/02/2015  . Carotid stenosis, asymptomatic 12/16/2014  . HTN (hypertension) 10/03/2014  . Abnormal glucose 10/03/2014  . Medication management 10/03/2014  . COPD (chronic obstructive pulmonary disease) ([place])   . Asthma   . Vitamin D deficiency   . COLONIC POLYPS 04/30/2008  . Mixed hyperlipidemia 04/30/2008  . Internal hemorrhoids 04/30/2008  . GERD 04/30/2008  . Gastric ulcer 04/30/2008  . Duodenal ulcer without hemorrhage or perforation and without obstruction 04/30/2008    Past Surgical History:  Procedure Laterality Date  . ABDOMINAL HYSTERECTOMY    . APPENDECTOMY    . CATARACT EXTRACTION, BILATERAL    . COLONOSCOPY    . ELBOW SURGERY Right   . ENDARTERECTOMY Right 12/16/2014   Procedure: ENDARTERECTOMY CAROTID with Dacron patch;  Surgeon: Rosetta Posner, MD;  Location: [facility];  Service: Vascular;  Laterality: Right;  . ESOPHAGOGASTRODUODENOSCOPY  10/2008  . HAND SURGERY Left   . LUMBAR DISC SURGERY    . TOTAL HIP ARTHROPLASTY Right   . trigeminal neuralgia surgery    . UPPER GASTROINTESTINAL ENDOSCOPY    . WRIST SURGERY Left     OB History    No data available       Home Medications    Prior to Admission medications  Medication Sig Start Date End Date Taking? Authorizing Provider  acetaminophen (TYLENOL) 500 MG tablet Take 500 mg by mouth every 6 (six) hours as needed for moderate pain or headache.    Yes Historical Provider, MD  albuterol (PROVENTIL HFA;VENTOLIN HFA) 108 (90 BASE) MCG/ACT inhaler Inhale 2 puffs into the lungs every 6 (six) hours as needed for wheezing or shortness of breath. 11/16/13  Yes Kelby Aline, PA-C  aspirin EC 81 MG tablet Take 1 tablet (81 mg total) by mouth daily. 12/16/14  Yes Alvia Grove, PA-C  cetirizine (ZYRTEC) 10 MG tablet Take 10 mg by mouth as needed for allergies.   Yes Historical  Provider, MD  Cholecalciferol (VITAMIN D) 2000 UNITS CAPS Take 7000 units a day    Yes Historical Provider, MD  ezetimibe (ZETIA) 10 MG tablet Take 1 tablet (10 mg total) by mouth daily. 07/09/15  Yes Vicie Mutters, PA-C  Fluticasone-Salmeterol (ADVAIR DISKUS) 250-50 MCG/DOSE AEPB Inhale 1 puff into the lungs every 12 (twelve) hours.   Yes Historical Provider, MD  omeprazole (PRILOSEC) 40 MG capsule TAKE 1 CAPSULE (40 MG TOTAL) BY MOUTH 2 (TWO) TIMES DAILY. 06/21/16  Yes Unk Pinto, MD  dicyclomine (BENTYL) 10 MG capsule Take 1 capsule (10 mg total) by mouth 3 (three) times daily before meals. Patient not taking: Reported on 08/18/2016 12/10/15   Amy S Esterwood, PA-C  FeFum-FePoly-FA-B Cmp-C-Biot (INTEGRA PLUS) CAPS Take 1 capsule by mouth daily. Patient not taking: Reported on 08/18/2016 12/10/15   Amy Genia Harold, PA-C    Family History Family History  Problem Relation Age of Onset  . Hypertension Father   . Hyperlipidemia Father   . Heart disease Father   . Heart attack Father   . Colon polyps Mother   . Inflammatory bowel disease Mother   . Hypertension Mother   . Stroke Mother   . Heart disease Mother   . Hyperlipidemia Mother   . Varicose Veins Mother   . Heart attack Mother   . AAA (abdominal aortic aneurysm) Mother   . Diabetes Maternal Grandfather   . Heart disease Maternal Grandfather   . Heart disease Maternal Grandmother   . Colon cancer      mat great aunt  . Heart disease Brother   . Hyperlipidemia Brother   . Hypertension Brother   . Heart attack Brother   . AAA (abdominal aortic aneurysm) Brother   . Varicose Veins Daughter   . Esophageal cancer Neg Hx   . Stomach cancer Neg Hx   . Rectal cancer Neg Hx     Social History Social History  Substance Use Topics  . Smoking status: Current Some Day Smoker    Packs/day: 0.25    Years: 36.00    Types: Cigarettes    Last attempt to quit: 06/18/2014  . Smokeless tobacco: Never Used     Comment: 1-2 sometimes  not daily  . Alcohol use 0.0 oz/week     Comment: occasional     Allergies   Celebrex [celecoxib]; Crestor [rosuvastatin]; Penicillins; and Pravastatin   Review of Systems Review of Systems  Constitutional: Positive for diaphoresis. Negative for chills, fatigue and fever.  HENT: Negative for congestion and facial swelling.   Respiratory: Positive for cough and shortness of breath. Negative for wheezing.   Cardiovascular: Positive for chest pain. Negative for palpitations and leg swelling.  Gastrointestinal: Negative for abdominal pain, blood in stool, diarrhea, nausea and vomiting.  Genitourinary: Positive for frequency. Negative for dysuria, flank pain, hematuria  and urgency.  Musculoskeletal: Positive for gait problem. Negative for arthralgias, back pain, myalgias, neck pain and neck stiffness.  Skin: Negative for rash and wound.  Neurological: Positive for dizziness, speech difficulty, weakness, light-headedness and headaches. Negative for syncope and numbness.  All other systems reviewed and are negative.    Physical Exam Updated Vital Signs BP (!) 98/46   Pulse 79   Temp 98.6 F (37 C)   Resp 15   Wt 99 lb (44.9 kg)   SpO2 99%   BMI 18.71 kg/m   Physical Exam  Constitutional: She is oriented to person, place, and time. She appears well-developed and well-nourished. No distress.  HENT:  Head: Normocephalic and atraumatic.  Mouth/Throat: Oropharynx is clear and moist. No oropharyngeal exudate.  Eyes: EOM are normal. Pupils are equal, round, and reactive to light.  Neck: Normal range of motion. Neck supple.  Bruit appreciated over the right carotid  Cardiovascular: Normal rate and regular rhythm.   Pulmonary/Chest: Effort normal and breath sounds normal. No stridor. No respiratory distress. She has no wheezes. She has no rales. She exhibits no tenderness.  Abdominal: Soft. Bowel sounds are normal. There is no tenderness. There is no rebound and no guarding.    Musculoskeletal: Normal range of motion. She exhibits no edema or tenderness.  No lower extremity swelling, asymmetry or tenderness. Distal pulses are intact and equal. No midline thoracic or lumbar tenderness.  Lymphadenopathy:    She has no cervical adenopathy.  Neurological: She is alert and oriented to person, place, and time.  Patient is alert and oriented x3 with clear, goal oriented speech. Patient has 5/5 motor in bilateral upper and right lower extremity. Very mild left lower extremity weakness. Sensation is intact to light touch. Bilateral finger-to-nose is normal with no signs of dysmetria.   Skin: Skin is warm and dry. Capillary refill takes less than 2 seconds. No rash noted. No erythema.  Psychiatric: She has a normal mood and affect. Her behavior is normal.  Nursing note and vitals reviewed.    ED Treatments / Results  Labs (all labs ordered are listed, but only abnormal results are displayed) Labs Reviewed  PROTIME-INR - Abnormal; Notable for the following:       Result Value   Prothrombin Time 16.6 (*)    All other components within normal limits  CBC - Abnormal; Notable for the following:    RBC 2.67 (*)    Hemoglobin 8.4 (*)    HCT 24.7 (*)    Platelets 145 (*)    All other components within normal limits  COMPREHENSIVE METABOLIC PANEL - Abnormal; Notable for the following:    CO2 21 (*)    ALT 8 (*)    All other components within normal limits  CBG MONITORING, ED - Abnormal; Notable for the following:    Glucose-Capillary 116 (*)    All other components within normal limits  I-STAT CHEM 8, ED - Abnormal; Notable for the following:    Chloride 114 (*)    BUN 23 (*)    Calcium, Ion 1.07 (*)    All other components within normal limits  I-STAT CG4 LACTIC ACID, ED - Abnormal; Notable for the following:    Lactic Acid, Venous 0.43 (*)    All other components within normal limits  APTT  DIFFERENTIAL  URINALYSIS, ROUTINE W REFLEX MICROSCOPIC (NOT AT [facility])   Randolm Idol, ED    EKG  EKG Interpretation  Date/Time:  Wednesday August 18 2016 12:07:42 EDT  Ventricular Rate:  94 PR Interval:  122 QRS Duration: 62 QT Interval:  370 QTC Calculation: 462 R Axis:   91 Text Interpretation:  Normal sinus rhythm Rightward axis Junctional ST depression, probably normal Borderline ECG Confirmed by Lita Mains  MD, Shanvi Moyd ([id number]) on 08/18/2016 1:25:23 PM       Radiology Ct Angio Head W Or Wo Contrast  Result Date: 08/18/2016 CLINICAL DATA:  Slurred speech and drooling.  Chest pain EXAM: CT ANGIOGRAPHY HEAD AND NECK TECHNIQUE: Multidetector CT imaging of the head and neck was performed using the standard protocol during bolus administration of intravenous contrast. Multiplanar CT image reconstructions and MIPs were obtained to evaluate the vascular anatomy. Carotid stenosis measurements (when applicable) are obtained utilizing NASCET criteria, using the distal internal carotid diameter as the denominator. CONTRAST:  100 mL Isovue 370 IV COMPARISON:  CT head 08/18/2016 FINDINGS: CTA NECK FINDINGS Aortic arch: Minimal atherosclerotic calcification aortic arch. Negative for aneurysm or dissection. Proximal great vessels widely patent. Right carotid system: Normal right carotid without significant atherosclerotic disease or stenosis. No dissection. Left carotid system: Mild atherosclerotic calcification in the carotid bulb without significant stenosis. Vertebral arteries: Left vertebral artery dominant. Mild stenosis at the origin. Mild atherosclerotic calcification distal left vertebral artery without significant stenosis Nondominant right vertebral artery. Mild to moderate stenosis at the origin. Right vertebral artery is patent through the remainder of its course and contributes to the basilar. Skeleton: Negative Other neck: Negative Upper chest: Chest CT from today reported separately Review of the MIP images confirms the above findings CTA HEAD FINDINGS  Anterior circulation: Mild atherosclerotic calcification in the cavernous carotid bilaterally. No significant stenosis of the carotid artery through the cavernous segment. Anterior and middle cerebral arteries widely patent bilaterally without stenosis or occlusion. Posterior circulation: Both vertebral arteries patent to the basilar. PICA patent bilaterally. Right AICA patent. Basilar widely patent. Superior cerebellar and posterior cerebral arteries patent bilaterally. Venous sinuses: Widely patent. Anatomic variants: None Delayed phase: Normal enhancement on delayed imaging. Negative for mass lesion. Review of the MIP images confirms the above findings IMPRESSION: Carotid artery widely patent bilaterally without significant stenosis. Mild atherosclerotic disease at the left carotid bifurcation and in the cavernous segment bilaterally Mild stenosis at the origin the left vertebral artery and mild to moderate stenosis at the origin of the right vertebral artery. No significant intracranial stenosis. Electronically Signed   By: Franchot Gallo M.D.   On: 08/18/2016 15:52   Ct Head Wo Contrast  Result Date: 08/18/2016 CLINICAL DATA:  Blurry vision Cels[place], slurred speech EXAM: CT HEAD WITHOUT CONTRAST TECHNIQUE: Contiguous axial images were obtained from the base of the skull through the vertex without intravenous contrast. COMPARISON:  11/11/2014 FINDINGS: Brain: No intracranial hemorrhage, mass effect or midline shift. No acute cortical infarction. No mass lesion is noted on this unenhanced scan. Vascular: Mild atherosclerotic calcifications of carotid siphon. Skull: No skull fracture is noted. Sinuses/Orbits: No acute finding. Other: None IMPRESSION: No acute intracranial abnormality. No significant change. No definite acute cortical infarction. Electronically Signed   By: Lahoma Crocker M.D.   On: 08/18/2016 13:26   Ct Angio Neck W And/or Wo Contrast  Result Date: 08/18/2016 CLINICAL DATA:  Slurred speech and  drooling.  Chest pain EXAM: CT ANGIOGRAPHY HEAD AND NECK TECHNIQUE: Multidetector CT imaging of the head and neck was performed using the standard protocol during bolus administration of intravenous contrast. Multiplanar CT image reconstructions and MIPs were obtained to evaluate the vascular anatomy. Carotid stenosis measurements (when  applicable) are obtained utilizing NASCET criteria, using the distal internal carotid diameter as the denominator. CONTRAST:  100 mL Isovue 370 IV COMPARISON:  CT head 08/18/2016 FINDINGS: CTA NECK FINDINGS Aortic arch: Minimal atherosclerotic calcification aortic arch. Negative for aneurysm or dissection. Proximal great vessels widely patent. Right carotid system: Normal right carotid without significant atherosclerotic disease or stenosis. No dissection. Left carotid system: Mild atherosclerotic calcification in the carotid bulb without significant stenosis. Vertebral arteries: Left vertebral artery dominant. Mild stenosis at the origin. Mild atherosclerotic calcification distal left vertebral artery without significant stenosis Nondominant right vertebral artery. Mild to moderate stenosis at the origin. Right vertebral artery is patent through the remainder of its course and contributes to the basilar. Skeleton: Negative Other neck: Negative Upper chest: Chest CT from today reported separately Review of the MIP images confirms the above findings CTA HEAD FINDINGS Anterior circulation: Mild atherosclerotic calcification in the cavernous carotid bilaterally. No significant stenosis of the carotid artery through the cavernous segment. Anterior and middle cerebral arteries widely patent bilaterally without stenosis or occlusion. Posterior circulation: Both vertebral arteries patent to the basilar. PICA patent bilaterally. Right AICA patent. Basilar widely patent. Superior cerebellar and posterior cerebral arteries patent bilaterally. Venous sinuses: Widely patent. Anatomic variants:  None Delayed phase: Normal enhancement on delayed imaging. Negative for mass lesion. Review of the MIP images confirms the above findings IMPRESSION: Carotid artery widely patent bilaterally without significant stenosis. Mild atherosclerotic disease at the left carotid bifurcation and in the cavernous segment bilaterally Mild stenosis at the origin the left vertebral artery and mild to moderate stenosis at the origin of the right vertebral artery. No significant intracranial stenosis. Electronically Signed   By: Franchot Gallo M.D.   On: 08/18/2016 15:52   Ct Angio Chest Aorta W And/or Wo Contrast  Result Date: 08/18/2016 CLINICAL DATA:  60 year old female with intermittent left chest pain for 1 week. Pain radiating to the left shoulder blade. Initial encounter. EXAM: CT ANGIOGRAPHY CHEST WITH CONTRAST TECHNIQUE: Multidetector CT imaging of the chest was performed using the standard protocol during bolus administration of intravenous contrast. Multiplanar CT image reconstructions and MIPs were obtained to evaluate the vascular anatomy. CONTRAST:  100 mL Isovue 370 COMPARISON:  Chest CTA 11/19/2014 FINDINGS: Cardiovascular: Good contrast bolus timing in the aorta and also in the pulmonary arterial system. No pericardial effusion. Calcified aortic atherosclerosis. Calcified coronary artery atherosclerosis. No thoracic aortic dissection or aneurysm. Proximal great vessels are patent. No focal filling defect identified in the pulmonary arteries to suggest acute pulmonary embolism. Mediastinum/Nodes: No lymphadenopathy. Lungs/Pleura: Minimal retained secretions in the right mainstem bronchus. Mild bilateral lower lobe peribronchial thickening. Right greater than left lower lobe curvilinear opacity which most resembles subsegmental atelectasis. Mild depending ground-glass opacity also felt due to atelectasis. No other abnormal pulmonary opacity. No pleural effusion. Upper Abdomen: No proximal abdominal aortic  dissection or aneurysm. Calcified atherosclerosis at the origins of the abdominal aortic branches. Negative visualized liver, gallbladder, spleen, pancreas, adrenal glands, kidneys, and bowel in the upper abdomen. Musculoskeletal: Chronic posterior lateral left seventh through ninth rib fractures. No acute osseous abnormality identified. Review of the MIP images confirms the above findings. IMPRESSION: 1. No thoracic aortic dissection or aneurysm. No evidence of acute pulmonary embolus. Calcified atherosclerosis of the aorta and coronary arteries. 2. Bilateral lower lobe peribronchial thickening which may be inflammatory. Scattered lower lobe subsegmental atelectasis. 3. Chronic posterior left rib fractures. Electronically Signed   By: Genevie Ann M.D.   On: 08/18/2016 15:49  Procedures Procedures (including critical care time)  Medications Ordered in ED Medications  iopamidol (ISOVUE-370) 76 % injection (100 mLs  Contrast Given 08/18/16 1459)     Initial Impression / Assessment and Plan / ED Course  I have reviewed the triage vital signs and the nursing notes.  Pertinent labs & imaging results that were available during my care of the patient were reviewed by me and considered in my medical decision making (see chart for details).  Clinical Course   CT angio without acute findings. Stable in the emergency department. Discuss with hospitalist and we'll admit to telemetry observation bed for TIA/CVA workup.   Final Clinical Impressions(s) / ED Diagnoses   Final diagnoses:  Left-sided weakness  Atypical chest pain    New Prescriptions New Prescriptions   No medications on file     Julianne Rice, MD 08/18/16 1635

## 2016-08-19 ENCOUNTER — Observation Stay (HOSPITAL_COMMUNITY): Payer: BLUE CROSS/BLUE SHIELD

## 2016-08-19 DIAGNOSIS — R29810 Facial weakness: Secondary | ICD-10-CM | POA: Diagnosis not present

## 2016-08-19 DIAGNOSIS — E43 Unspecified severe protein-calorie malnutrition: Secondary | ICD-10-CM | POA: Diagnosis not present

## 2016-08-19 DIAGNOSIS — I9589 Other hypotension: Secondary | ICD-10-CM | POA: Diagnosis not present

## 2016-08-19 DIAGNOSIS — R531 Weakness: Secondary | ICD-10-CM

## 2016-08-19 DIAGNOSIS — D509 Iron deficiency anemia, unspecified: Secondary | ICD-10-CM | POA: Diagnosis not present

## 2016-08-19 DIAGNOSIS — R0789 Other chest pain: Secondary | ICD-10-CM | POA: Diagnosis not present

## 2016-08-19 DIAGNOSIS — G459 Transient cerebral ischemic attack, unspecified: Secondary | ICD-10-CM | POA: Diagnosis present

## 2016-08-19 DIAGNOSIS — I959 Hypotension, unspecified: Secondary | ICD-10-CM

## 2016-08-19 LAB — BASIC METABOLIC PANEL
Anion gap: 5 (ref 5–15)
BUN: 8 mg/dL (ref 6–20)
CO2: 22 mmol/L (ref 22–32)
Calcium: 8.7 mg/dL — ABNORMAL LOW (ref 8.9–10.3)
Chloride: 113 mmol/L — ABNORMAL HIGH (ref 101–111)
Creatinine, Ser: 0.51 mg/dL (ref 0.44–1.00)
GFR calc Af Amer: >60 mL/min (ref >60)
GFR calc non Af Amer: >60 mL/min (ref >60)
Glucose, Bld: 73 mg/dL (ref 65–99)
Potassium: 3.9 mmol/L (ref 3.5–5.1)
Sodium: 140 mmol/L (ref 135–145)

## 2016-08-19 LAB — CBC
HCT: 31.9 % — ABNORMAL LOW (ref 36.0–46.0)
Hemoglobin: 9.9 g/dL — ABNORMAL LOW (ref 12.0–15.0)
MCH: 29.6 pg (ref 26.0–34.0)
MCHC: 31 g/dL (ref 30.0–36.0)
MCV: 95.5 fL (ref 78.0–100.0)
Platelets: 188 10*3/uL (ref 150–400)
RBC: 3.34 MIL/uL — ABNORMAL LOW (ref 3.87–5.11)
WBC: 3.5 10*3/uL — ABNORMAL LOW (ref 4.0–10.5)

## 2016-08-19 LAB — TROPONIN I
Troponin I: <0.03 ng/mL (ref <0.03)
Troponin I: <0.03 ng/mL (ref <0.03)

## 2016-08-19 LAB — CORTISOL-AM, BLOOD: Cortisol - AM: 10.9 ug/dL (ref 6.7–22.6)

## 2016-08-19 MED ORDER — ENSURE ENLIVE PO LIQD
237.0000 mL | Freq: Two times a day (BID) | ORAL | Status: DC
  Administered 2016-08-19 – 2016-08-20 (×3): 237 mL via ORAL

## 2016-08-19 MED ORDER — SODIUM CHLORIDE 0.9 % IV BOLUS (SEPSIS)
500.0000 mL | Freq: Once | INTRAVENOUS | Status: AC
  Administered 2016-08-19: 500 mL via INTRAVENOUS

## 2016-08-19 MED ORDER — MIDODRINE HCL 5 MG PO TABS
5.0000 mg | ORAL_TABLET | Freq: Two times a day (BID) | ORAL | Status: DC
  Administered 2016-08-19 – 2016-08-20 (×3): 5 mg via ORAL
  Filled 2016-08-19 (×3): qty 1

## 2016-08-19 NOTE — Evaluation (Signed)
Occupational Therapy Evaluation Patient Details Name: [name] MRN: [id number] DOB: 1956-02-25 Today's Date: 08/19/2016    History of Present Illness 60 y.o.femalewith a history of COPD, tobacco use, HTN, HLD, carotid stenosis s/p CEA 2016, gastric ulcer and chronic iron-deficiency anemia presenting with L chest pain, light-headedness, dysarthria, L facial droop with drooling, and stumbling gait. She also noted several weeks of generalized weakness and low blood pressure worsening in the past week. MRI on 11/2 negative for acute infarct.   Clinical Impression   Pt reports she was independent with ADL PTA. Currently pt overall supervision for seated ADL and min assist for ADL in standing and for functional mobility. Pt with LOB x3 during functional mobility/activities this session; able to self correct with min external assist provided for balance. BP 108/53 in supine, 96/53 in sitting; pt asymptomatic throughout session. Pt planning to d/c home with intermittent family supervision; her husband works during the day. Pt would benefit from continued skilled OT to address established goals.    Follow Up Recommendations  No OT follow up;Supervision - Intermittent    Equipment Recommendations  Tub/shower seat    Recommendations for Other Services PT consult     Precautions / Restrictions Precautions Precautions: Fall Precaution Comments: watch BP Restrictions Weight Bearing Restrictions: No      Mobility Bed Mobility Overal bed mobility: Needs Assistance Bed Mobility: Supine to Sit     Supine to sit: Supervision;HOB elevated     General bed mobility comments: Supervision for safety due to low BP. Pt asymptomatic at this time.  Transfers Overall transfer level: Needs assistance Equipment used: None Transfers: Sit to/from Stand Sit to Stand: Min assist         General transfer comment: Min steadying assist for balance once in standing.    Balance Overall  balance assessment: Needs assistance;History of Falls Sitting-balance support: Feet supported;No upper extremity supported Sitting balance-Leahy Scale: Good     Standing balance support: No upper extremity supported;During functional activity Standing balance-Leahy Scale: Fair                              ADL Overall ADL's : Needs assistance/impaired Eating/Feeding: Modified independent;Sitting   Grooming: Minimal assistance;Standing;Wash/dry hands Grooming Details (indicate cue type and reason): Min assist for balance in standing. Upper Body Bathing: Set up;Supervision/ safety;Sitting   Lower Body Bathing: Minimal assistance;Sit to/from stand Lower Body Bathing Details (indicate cue type and reason): assist for balance Upper Body Dressing : Set up;Supervision/safety;Sitting   Lower Body Dressing: Minimal assistance;Sit to/from stand Lower Body Dressing Details (indicate cue type and reason): assist for balance Toilet Transfer: Minimal assistance;Ambulation;Regular Teacher, adult education Details (indicate cue type and reason): Simulated by sit to stand from EOB with functional mobility in room.         Functional mobility during ADLs: Minimal assistance General ADL Comments: Pt with LOB x3 during functional mobility/activities this session. Pt able to correct mostly on her own; light min assist provided for balance at times. BP in supine 108/53, sitting 96/53. Pt asymptomatic throughout session.     Vision Additional Comments: Appears WFL at this time. Pt reports vision only gets blurry when CVA symptoms present. Currently no c/o of blurred vision.   Perception     Praxis      Pertinent Vitals/Pain Pain Assessment: No/denies pain     Hand Dominance Right   Extremity/Trunk Assessment Upper Extremity Assessment Upper Extremity Assessment: Overall [facility]  for tasks assessed   Lower Extremity Assessment Lower Extremity Assessment: Defer to PT evaluation        Communication Communication Communication: No difficulties   Cognition Arousal/Alertness: Awake/alert Behavior During Therapy: WFL for tasks assessed/performed Overall Cognitive Status: Within Functional Limits for tasks assessed                     General Comments       Exercises       Shoulder Instructions      Home Living Family/patient expects to be discharged to:: Private residence Living Arrangements: Spouse/significant other Available Help at Discharge: Family;Available PRN/intermittently (husband works during the day) Type of Home: House Home Access: Stairs to enter [employer] of Steps: 2   Home Layout: One level     Bathroom Shower/Tub: Tub/shower unit;Walk-in shower Shower/tub characteristics: Engineer, building services: Standard     Home Equipment: Environmental consultant - 4 wheels;Cane - single point          Prior Functioning/Environment Level of Independence: Independent                 OT Problem List: Impaired balance (sitting and/or standing);Impaired vision/perception;Decreased knowledge of use of DME or AE   OT Treatment/Interventions: Self-care/ADL training;Therapeutic exercise;Energy conservation;DME and/or AE instruction;Therapeutic activities;Patient/family education;Balance training    OT Goals(Current goals can be found in the care plan section) Acute Rehab OT Goals Patient Stated Goal: not feel so weak/tired OT Goal Formulation: With patient Time For Goal Achievement: 09/02/16 Potential to Achieve Goals: Good ADL Goals Pt Will Perform Grooming: with modified independence;standing Pt Will Transfer to Toilet: with modified independence;ambulating;regular height toilet Pt Will Perform Toileting - Clothing Manipulation and hygiene: with modified independence;sit to/from stand Pt Will Perform Tub/Shower Transfer: Tub transfer;with supervision;ambulating Additional ADL Goal #1: Pt will independently verbally recall 3 fall prevention  strategies.  OT Frequency: Min 2X/week   Barriers to D/C: Decreased caregiver support  husband works during the day       Co-evaluation              End of Session Equipment Utilized During Treatment: Stage manager Communication: Mobility status  Activity Tolerance: Patient tolerated treatment well Patient left: in chair;with call bell/phone within reach;with chair alarm set   Time: [id number] OT Time Calculation (min): 20 min Charges:  OT General Charges $OT Visit: 1 Procedure OT Evaluation $OT Eval Moderate Complexity: 1 Procedure G-Codes: OT G-codes **NOT FOR INPATIENT CLASS** Functional Assessment Tool Used: Clinical judgement Functional Limitation: Self care Self Care Current Status (Y7829): At least 1 percent but less than 20 percent impaired, limited or restricted Self Care Goal Status (F6213): At least 1 percent but less than 20 percent impaired, limited or restricted    Gaye Alken M.S., OTR/L Pager: [phone]  08/19/2016, 8:38 AM

## 2016-08-19 NOTE — Consult Note (Signed)
NEURO HOSPITALIST CONSULT NOTE   Requestig physician: Dr. Broadus John   Reason for Consult: AMS in setting hypotension   History obtained from:  Patient     HPI:                                                                                                                                          [name] is an 60 y.o. female with a history of COPD, tobacco use, HTN, HLD, carotid stenosis s/p CEA 2016, gastric ulcer and chronic iron-deficiency anemia who was sent from her PCP for CVA evaluation.   She notes sudden onset at 6:00pm 08/17/2016 of midthoracic back pain while rising from seated position, associated with left chest pain and light-headedness. She has chronic dyspnea on exertion but notes none at this time. She later noticed dysarthria, left facial droop with drooling, and stumbling gait. These symptoms have waxed and waned, eventually largely resolving by the time of evaluation at PCP's office this morning. She was sent for concern of CVA. She also noted several weeks of generalized weakness and low blood pressures worsening in the past week (99991111 systolic) at home. She has had decreased appetite and chronic weight loss as well.   ED Course: On arrival the patient was in no distress, still complaining of some speech disturbance without other focal neurological deficits or chest pain or back pain. She was afebrile, hypotensive (98/46) with HR 79bpm. Right carotid bruit was noted and neurological exam was normal with the exception of subtle right lower extremity weakness per EDP. CT head showed no acute abnormalities. Further imaging ruled out aortic dissection as a cause of chest and thoracic back pain. BPs remained low.   While on the floor patient received 1 L fluid.. Patient was noted to be orthostatic on standing . Later during the shift patient was noted a blood pressure of 80/40. Manually it was rechecked 86/46. And patient received another bolus of his 500  mL of fluid.   MRI brain was obtained along with MRA of brain which showed no acute infarct. CTA of head and neck were also obtained showing no significant stenosis.  Currently patient is asymptomatic.  Patient admits to taking an aspirin on a daily basis.  Past Medical History:  Diagnosis Date  . Allergy   . Anemia   . Arthritis   . Asthma   . Blood transfusion without reported diagnosis   . Cataract   . COPD (chronic obstructive pulmonary disease) ([place])   . Duodenal ulcer   . Emphysema of lung ([place])   . Family history of malignant neoplasm of gastrointestinal tract   . GERD (gastroesophageal reflux disease)   . Hepatitis    had in 6th grade pt unsure which kind  . Hiatal hernia   .  HTN (hypertension) 10/03/2014  . Hx of colonic polyps   . Hyperlipemia   . Internal hemorrhoids   . Osteoporosis   . Peripheral vascular disease ([place])   . Pneumonia    hx of  . Shortness of breath dyspnea    with ambulation  . Vitamin D deficiency     Past Surgical History:  Procedure Laterality Date  . ABDOMINAL HYSTERECTOMY    . APPENDECTOMY    . CATARACT EXTRACTION, BILATERAL    . COLONOSCOPY    . ELBOW SURGERY Right   . ENDARTERECTOMY Right 12/16/2014   Procedure: ENDARTERECTOMY CAROTID with Dacron patch;  Surgeon: Rosetta Posner, MD;  Location: [facility];  Service: Vascular;  Laterality: Right;  . ESOPHAGOGASTRODUODENOSCOPY  10/2008  . HAND SURGERY Left   . LUMBAR DISC SURGERY    . TOTAL HIP ARTHROPLASTY Right   . trigeminal neuralgia surgery    . UPPER GASTROINTESTINAL ENDOSCOPY    . WRIST SURGERY Left     Family History  Problem Relation Age of Onset  . Hypertension Father   . Hyperlipidemia Father   . Heart disease Father   . Heart attack Father   . Colon polyps Mother   . Inflammatory bowel disease Mother   . Hypertension Mother   . Stroke Mother   . Heart disease Mother   . Hyperlipidemia Mother   . Varicose Veins Mother   . Heart attack Mother   . AAA (abdominal aortic  aneurysm) Mother   . Diabetes Maternal Grandfather   . Heart disease Maternal Grandfather   . Heart disease Maternal Grandmother   . Colon cancer      mat great aunt  . Heart disease Brother   . Hyperlipidemia Brother   . Hypertension Brother   . Heart attack Brother   . AAA (abdominal aortic aneurysm) Brother   . Varicose Veins Daughter   . Esophageal cancer Neg Hx   . Stomach cancer Neg Hx   . Rectal cancer Neg Hx     Social History:  reports that she has been smoking Cigarettes.  She has a 9.00 pack-year smoking history. She has never used smokeless tobacco. She reports that she drinks alcohol. She reports that she does not use drugs.  Allergies  Allergen Reactions  . Celebrex [Celecoxib]     GI bleed  . Crestor [Rosuvastatin]     Made legs hurt  . Penicillins     REACTION: whelps  . Pravastatin     Muscle aches    MEDICATIONS:                                                                                                                     Scheduled: . aspirin EC  81 mg Oral Daily  . enoxaparin (LOVENOX) injection  30 mg Subcutaneous Q24H  . ezetimibe  10 mg Oral Daily  . mometasone-formoterol  2 puff Inhalation BID  . pantoprazole  40 mg Oral Daily     ROS:  History obtained from the patient  General ROS: negative for - chills, fatigue, fever, night sweats, weight gain or weight loss Psychological ROS: negative for - behavioral disorder, hallucinations, memory difficulties, mood swings or suicidal ideation Ophthalmic ROS: negative for - blurry vision, double vision, eye pain or loss of vision ENT ROS: negative for - epistaxis, nasal discharge, oral lesions, sore throat, tinnitus or vertigo Allergy and Immunology ROS: negative for - hives or itchy/watery eyes Hematological and Lymphatic ROS: negative for - bleeding problems,  bruising or swollen lymph nodes Endocrine ROS: negative for - galactorrhea, hair pattern changes, polydipsia/polyuria or temperature intolerance Respiratory ROS: negative for - cough, hemoptysis, shortness of breath or wheezing Cardiovascular ROS: negative for - chest pain, dyspnea on exertion, edema or irregular heartbeat Gastrointestinal ROS: negative for - abdominal pain, diarrhea, hematemesis, nausea/vomiting or stool incontinence Genito-Urinary ROS: negative for - dysuria, hematuria, incontinence or urinary frequency/urgency Musculoskeletal ROS: negative for - joint swelling or muscular weakness Neurological ROS: as noted in HPI Dermatological ROS: negative for rash and skin lesion changes   Blood pressure (!) 105/46, pulse 71, temperature 98.6 F (37 C), temperature source Oral, resp. rate 16, height 5\' 1"  (1.549 m), weight 48.4 kg (106 lb 11.2 oz), SpO2 94 %.   Neurologic Examination:                                                                                                      HEENT-  Normocephalic, no lesions, without obvious abnormality.  Normal external eye and conjunctiva.  Normal TM's bilaterally.  Normal auditory canals and external ears. Normal external nose, mucus membranes and septum.  Normal pharynx. Cardiovascular- S1, S2 normal, pulses palpable throughout   Lungs- chest clear, no wheezing, rales, normal symmetric air entry Abdomen- normal findings: bowel sounds normal Extremities- less then 2 second capillary refill Lymph-no adenopathy palpable Musculoskeletal-no joint tenderness, deformity or swelling Skin-warm and dry, no hyperpigmentation, vitiligo, or suspicious lesions  Neurological Examination Mental Status: Alert, oriented, thought content appropriate.  Speech fluent without evidence of aphasia.  Able to follow 3 step commands without difficulty. Cranial Nerves: II: Discs flat bilaterally; Visual fields grossly normal, pupils equal, round, reactive to  light and accommodation III,IV, VI: ptosis not present, extra-ocular motions intact bilaterally V,VII: smile symmetric, facial light touch sensation normal bilaterally VIII: hearing normal bilaterally IX,X: uvula rises symmetrically XI: bilateral shoulder shrug XII: midline tongue extension Motor: Right : Upper extremity   5/5    Left:     Upper extremity   5/5  Lower extremity   5/5     Lower extremity   5/5 Tone and bulk:normal tone throughout; no atrophy noted Sensory: Pinprick and light touch intact throughout, bilaterally Deep Tendon Reflexes: 1+ and symmetric throughout Plantars: Right: downgoing   Left: downgoing Cerebellar: normal finger-to-nose, normal rapid alternating movements and normal heel-to-shin test Gait: Not tested     Lab Results: Basic Metabolic Panel:  Recent Labs Lab 08/18/16 1205 08/18/16 1242 08/19/16 0744  NA 142 142 140  K 5.1 5.1 3.9  CL 110 114* 113*  CO2 21*  --  22  GLUCOSE 99 91 73  BUN 18 23* 8  CREATININE 0.72 0.70 0.51  CALCIUM 9.8  --  8.7*    Liver Function Tests:  Recent Labs Lab 08/18/16 1205  AST 16  ALT 8*  ALKPHOS 53  BILITOT 0.3  PROT 7.0  ALBUMIN 4.0   No results for input(s): LIPASE, AMYLASE in the last 168 hours. No results for input(s): AMMONIA in the last 168 hours.  CBC:  Recent Labs Lab 08/18/16 1205 08/18/16 1242 08/19/16 0744  WBC 7.1  --  3.5*  NEUTROABS 5.5  --   --   HGB 8.4* 13.3 9.9*  HCT 24.7* 39.0 31.9*  MCV 92.5  --  95.5  PLT 145*  --  188    Cardiac Enzymes:  Recent Labs Lab 08/18/16 1921 08/19/16 0116 08/19/16 0744  TROPONINI <0.03 <0.03 <0.03    Lipid Panel: No results for input(s): CHOL, TRIG, HDL, CHOLHDL, VLDL, LDLCALC in the last 168 hours.  CBG:  Recent Labs Lab 08/18/16 1610  GLUCAP 116*    Microbiology: Results for orders placed or performed in visit on [id number]  Helicobacter pylori screen-biopsy     Status: None   Collection Time: 12/31/15  3:23 PM   Result Value Ref Range Status   UREASE Negative Negative Final    Coagulation Studies:  Recent Labs  08/18/16 1205  LABPROT 16.6*  INR 1.34    Imaging: Ct Angio Head W Or Wo Contrast  Result Date: 08/18/2016 CLINICAL DATA:  Slurred speech and drooling.  Chest pain EXAM: CT ANGIOGRAPHY HEAD AND NECK TECHNIQUE: Multidetector CT imaging of the head and neck was performed using the standard protocol during bolus administration of intravenous contrast. Multiplanar CT image reconstructions and MIPs were obtained to evaluate the vascular anatomy. Carotid stenosis measurements (when applicable) are obtained utilizing NASCET criteria, using the distal internal carotid diameter as the denominator. CONTRAST:  100 mL Isovue 370 IV COMPARISON:  CT head 08/18/2016 FINDINGS: CTA NECK FINDINGS Aortic arch: Minimal atherosclerotic calcification aortic arch. Negative for aneurysm or dissection. Proximal great vessels widely patent. Right carotid system: Normal right carotid without significant atherosclerotic disease or stenosis. No dissection. Left carotid system: Mild atherosclerotic calcification in the carotid bulb without significant stenosis. Vertebral arteries: Left vertebral artery dominant. Mild stenosis at the origin. Mild atherosclerotic calcification distal left vertebral artery without significant stenosis Nondominant right vertebral artery. Mild to moderate stenosis at the origin. Right vertebral artery is patent through the remainder of its course and contributes to the basilar. Skeleton: Negative Other neck: Negative Upper chest: Chest CT from today reported separately Review of the MIP images confirms the above findings CTA HEAD FINDINGS Anterior circulation: Mild atherosclerotic calcification in the cavernous carotid bilaterally. No significant stenosis of the carotid artery through the cavernous segment. Anterior and middle cerebral arteries widely patent bilaterally without stenosis or occlusion.  Posterior circulation: Both vertebral arteries patent to the basilar. PICA patent bilaterally. Right AICA patent. Basilar widely patent. Superior cerebellar and posterior cerebral arteries patent bilaterally. Venous sinuses: Widely patent. Anatomic variants: None Delayed phase: Normal enhancement on delayed imaging. Negative for mass lesion. Review of the MIP images confirms the above findings IMPRESSION: Carotid artery widely patent bilaterally without significant stenosis. Mild atherosclerotic disease at the left carotid bifurcation and in the cavernous segment bilaterally Mild stenosis at the origin the left vertebral artery and mild to moderate stenosis at the origin of the right vertebral artery. No significant intracranial stenosis. Electronically Signed   By: Juanda Crumble  Carlis Abbott M.D.   On: 08/18/2016 15:52   Ct Head Wo Contrast  Result Date: 08/18/2016 CLINICAL DATA:  Blurry vision C[place], slurred speech EXAM: CT HEAD WITHOUT CONTRAST TECHNIQUE: Contiguous axial images were obtained from the base of the skull through the vertex without intravenous contrast. COMPARISON:  11/11/2014 FINDINGS: Brain: No intracranial hemorrhage, mass effect or midline shift. No acute cortical infarction. No mass lesion is noted on this unenhanced scan. Vascular: Mild atherosclerotic calcifications of carotid siphon. Skull: No skull fracture is noted. Sinuses/Orbits: No acute finding. Other: None IMPRESSION: No acute intracranial abnormality. No significant change. No definite acute cortical infarction. Electronically Signed   By: Lahoma Crocker M.D.   On: 08/18/2016 13:26   Ct Angio Neck W And/or Wo Contrast  Result Date: 08/18/2016 CLINICAL DATA:  Slurred speech and drooling.  Chest pain EXAM: CT ANGIOGRAPHY HEAD AND NECK TECHNIQUE: Multidetector CT imaging of the head and neck was performed using the standard protocol during bolus administration of intravenous contrast. Multiplanar CT image reconstructions and MIPs were  obtained to evaluate the vascular anatomy. Carotid stenosis measurements (when applicable) are obtained utilizing NASCET criteria, using the distal internal carotid diameter as the denominator. CONTRAST:  100 mL Isovue 370 IV COMPARISON:  CT head 08/18/2016 FINDINGS: CTA NECK FINDINGS Aortic arch: Minimal atherosclerotic calcification aortic arch. Negative for aneurysm or dissection. Proximal great vessels widely patent. Right carotid system: Normal right carotid without significant atherosclerotic disease or stenosis. No dissection. Left carotid system: Mild atherosclerotic calcification in the carotid bulb without significant stenosis. Vertebral arteries: Left vertebral artery dominant. Mild stenosis at the origin. Mild atherosclerotic calcification distal left vertebral artery without significant stenosis Nondominant right vertebral artery. Mild to moderate stenosis at the origin. Right vertebral artery is patent through the remainder of its course and contributes to the basilar. Skeleton: Negative Other neck: Negative Upper chest: Chest CT from today reported separately Review of the MIP images confirms the above findings CTA HEAD FINDINGS Anterior circulation: Mild atherosclerotic calcification in the cavernous carotid bilaterally. No significant stenosis of the carotid artery through the cavernous segment. Anterior and middle cerebral arteries widely patent bilaterally without stenosis or occlusion. Posterior circulation: Both vertebral arteries patent to the basilar. PICA patent bilaterally. Right AICA patent. Basilar widely patent. Superior cerebellar and posterior cerebral arteries patent bilaterally. Venous sinuses: Widely patent. Anatomic variants: None Delayed phase: Normal enhancement on delayed imaging. Negative for mass lesion. Review of the MIP images confirms the above findings IMPRESSION: Carotid artery widely patent bilaterally without significant stenosis. Mild atherosclerotic disease at the left  carotid bifurcation and in the cavernous segment bilaterally Mild stenosis at the origin the left vertebral artery and mild to moderate stenosis at the origin of the right vertebral artery. No significant intracranial stenosis. Electronically Signed   By: Franchot Gallo M.D.   On: 08/18/2016 15:52   Mr Brain Wo Contrast  Result Date: 08/19/2016 CLINICAL DATA:  60 y/o  F; left facial droop and stumbling gait. EXAM: MRI HEAD WITHOUT CONTRAST MRA HEAD WITHOUT CONTRAST TECHNIQUE: Multiplanar, multiecho pulse sequences of the brain and surrounding structures were obtained without intravenous contrast. Angiographic images of the head were obtained using MRA technique without contrast. COMPARISON:  08/18/2016 CT angiogram of the head and neck. 11/22/2014 MRI of the head. FINDINGS: MRI HEAD FINDINGS Brain: No acute infarction, hemorrhage, hydrocephalus, extra-axial collection or mass lesion. Few nonspecific foci of T2 FLAIR hyperintensity in subcortical and periventricular white matter within the pons are compatible with mild chronic microvascular ischemic changes.  Mild parenchymal volume loss. Vascular: See below. Skull and upper cervical spine: Normal marrow signal. Sinuses/Orbits: Mild mucosal thickening of frontal and right greater than left anterior ethmoid sinuses. No abnormal signal of the mastoid air cells. Bilateral intra-ocular lens replacement. Other: None. MRA HEAD FINDINGS Internal carotid arteries: Patent. Anterior cerebral arteries: Patent. Middle cerebral arteries: Patent. Anterior communicating artery: Not identified, likely hypoplastic or absent. Posterior communicating arteries: Not identified, likely hypoplastic or absent. Posterior cerebral arteries: Patent. Basilar artery: Patent. Vertebral arteries: Patent. No evidence of high-grade stenosis, large vessel occlusion, or aneurysm unless noted above. IMPRESSION: 1. No evidence of acute infarct, hemorrhage, or focal mass effect. 2. Patent circle of  Willis without evidence for high-grade stenosis, large vessel occlusion, or aneurysm. 3. Mild chronic microvascular ischemic changes and parenchymal volume loss are stable. 4. Mild paranasal sinus disease. Electronically Signed   By: Kristine Garbe M.D.   On: 08/19/2016 02:49   Mr Jodene Nam Head/brain [id number] Cm  Result Date: 08/19/2016 CLINICAL DATA:  60 y/o  F; left facial droop and stumbling gait. EXAM: MRI HEAD WITHOUT CONTRAST MRA HEAD WITHOUT CONTRAST TECHNIQUE: Multiplanar, multiecho pulse sequences of the brain and surrounding structures were obtained without intravenous contrast. Angiographic images of the head were obtained using MRA technique without contrast. COMPARISON:  08/18/2016 CT angiogram of the head and neck. 11/22/2014 MRI of the head. FINDINGS: MRI HEAD FINDINGS Brain: No acute infarction, hemorrhage, hydrocephalus, extra-axial collection or mass lesion. Few nonspecific foci of T2 FLAIR hyperintensity in subcortical and periventricular white matter within the pons are compatible with mild chronic microvascular ischemic changes. Mild parenchymal volume loss. Vascular: See below. Skull and upper cervical spine: Normal marrow signal. Sinuses/Orbits: Mild mucosal thickening of frontal and right greater than left anterior ethmoid sinuses. No abnormal signal of the mastoid air cells. Bilateral intra-ocular lens replacement. Other: None. MRA HEAD FINDINGS Internal carotid arteries: Patent. Anterior cerebral arteries: Patent. Middle cerebral arteries: Patent. Anterior communicating artery: Not identified, likely hypoplastic or absent. Posterior communicating arteries: Not identified, likely hypoplastic or absent. Posterior cerebral arteries: Patent. Basilar artery: Patent. Vertebral arteries: Patent. No evidence of high-grade stenosis, large vessel occlusion, or aneurysm unless noted above. IMPRESSION: 1. No evidence of acute infarct, hemorrhage, or focal mass effect. 2. Patent circle of Willis  without evidence for high-grade stenosis, large vessel occlusion, or aneurysm. 3. Mild chronic microvascular ischemic changes and parenchymal volume loss are stable. 4. Mild paranasal sinus disease. Electronically Signed   By: Kristine Garbe M.D.   On: 08/19/2016 02:49   Ct Angio Chest Aorta W And/or Wo Contrast  Result Date: 08/18/2016 CLINICAL DATA:  60 year old female with intermittent left chest pain for 1 week. Pain radiating to the left shoulder blade. Initial encounter. EXAM: CT ANGIOGRAPHY CHEST WITH CONTRAST TECHNIQUE: Multidetector CT imaging of the chest was performed using the standard protocol during bolus administration of intravenous contrast. Multiplanar CT image reconstructions and MIPs were obtained to evaluate the vascular anatomy. CONTRAST:  100 mL Isovue 370 COMPARISON:  Chest CTA 11/19/2014 FINDINGS: Cardiovascular: Good contrast bolus timing in the aorta and also in the pulmonary arterial system. No pericardial effusion. Calcified aortic atherosclerosis. Calcified coronary artery atherosclerosis. No thoracic aortic dissection or aneurysm. Proximal great vessels are patent. No focal filling defect identified in the pulmonary arteries to suggest acute pulmonary embolism. Mediastinum/Nodes: No lymphadenopathy. Lungs/Pleura: Minimal retained secretions in the right mainstem bronchus. Mild bilateral lower lobe peribronchial thickening. Right greater than left lower lobe curvilinear opacity which most resembles subsegmental atelectasis. Mild depending  ground-glass opacity also felt due to atelectasis. No other abnormal pulmonary opacity. No pleural effusion. Upper Abdomen: No proximal abdominal aortic dissection or aneurysm. Calcified atherosclerosis at the origins of the abdominal aortic branches. Negative visualized liver, gallbladder, spleen, pancreas, adrenal glands, kidneys, and bowel in the upper abdomen. Musculoskeletal: Chronic posterior lateral left seventh through ninth rib  fractures. No acute osseous abnormality identified. Review of the MIP images confirms the above findings. IMPRESSION: 1. No thoracic aortic dissection or aneurysm. No evidence of acute pulmonary embolus. Calcified atherosclerosis of the aorta and coronary arteries. 2. Bilateral lower lobe peribronchial thickening which may be inflammatory. Scattered lower lobe subsegmental atelectasis. 3. Chronic posterior left rib fractures. Electronically Signed   By: Genevie Ann M.D.   On: 08/18/2016 15:49       Assessment and plan per attending neurologist  Etta Quill PA-C [facility] [phone]  08/19/2016, 10:41 AM   Assessment/Plan:  This is a 60 year old female presenting to the hospital with transient symptoms of dizziness, lightheadedness, back pain, possible facial droop and drooling, all of which have resolved. Currently her exam is negative. On hospital patient showed multiple episodes of orthostatic hypotension and received multiple boluses of IV fluid.  MRI, MRA, CTA of head and neck all were nonrevealing.  Currently awaiting echocardiogram. Recent A1c one month ago was 4.8 and LDL is 9.2.  If echocardiogram is nonrevealing at this time no further stroke workup warranted. May consider tilt table test for further evaluation of her orthostatic hypotension that seems to be chronic.  Dr. Bertram Denver to further evaluate this note

## 2016-08-19 NOTE — Progress Notes (Signed)
PROGRESS NOTE    Berkleigh Imholte  KGM:[id number] DOB: [birth year] DOA: 08/18/2016 PCP: Nadean Corwin, MD  Brief Narrative:Katelynn Hallam is a 60 y.o. female with a history of COPD, tobacco use, HTN, HLD, carotid stenosis s/p CEA 2016, gastric ulcer and chronic iron-deficiency anemia who was sent from her PCP for CVA evaluation. Yesterday at 6pm she noticed midthoracic back pain while rising from seated position, associated with left chest pain and light-headedness. She later noticed dysarthria, left facial droop with drooling, and stumbling gait. These symptoms have waxed and waned, eventually largely resolving by the time of evaluation at PCP's office this morning. She was sent for concern of CVA. She reports long h/o Hypotension since R CEA 2years back, but several weeks of generalized weakness and low blood pressures worsening in the past week (80's systolic) at home. She has had decreased appetite and chronic weight loss as well  Assessment & Plan:   1. Possible TIA vs Neurological symptoms related to hypotension/hypoperfusion -resolved -MRI/MRA unremarkable for acute event -FU ECHO and carotid duplex -continue ASA -PT/OT evals  2. Chronic hypotension -pt recalls ongoing low/soft blood pressures since 2 years ago following carotid endarterectomy, unclear if potentially could've caused some damage to her baroreceptors -Given IVF plus fluid boluses overnight -Clinically no evidence of sepsis or dehydration at this time -Random morning cortisol level within acceptable range -Follow-up 2-D echocardiogram -Trial of low-dose midodrine  -Advised patient to consider compression stockings and caution with position changes, and liberalizing salt intake  3. Atypical chest pain -Transient yesterday in the setting of hypotension -EKG without acute findings -Troponin negative -We'll follow-up 2-D echocardiogram   4. Chronic iron deficiency anemia -Followed by Dr. Ave Filter and Dr. Yancey Flemings -Last baseline hemoglobins in the mid 8 range, had extensive GI workup this year -On IV iron infusions -Improving  5. Tobacco abuse -Counseled  6. COPD -stable  7. History of gastric ulcer -Continue PPI  DVT prophylaxis: Full code Code Status: Full confirmed at admission  Family Communication: Daughter at bedside Disposition Plan: Telemetry, observation for TIA work up and ACS rule out.  Subjective: Feels okay, no complaints -Mild generalized weakness which is long-standing  Objective: Vitals:   08/19/16 0535 08/19/16 0852 08/19/16 0900 08/19/16 1010  BP: (!) 90/54  (!) 105/46 (!) 97/45  Pulse:   71 68  Resp:   16   Temp:   98.6 F (37 C)   TempSrc:   Oral   SpO2:  95% 94%   Weight:      Height:        Intake/Output Summary (Last 24 hours) at 08/19/16 1332 Last data filed at 08/19/16 1217  Gross per 24 hour  Intake           2142.5 ml  Output                0 ml  Net           2142.5 ml   Filed Weights   08/18/16 1208 08/19/16 0330  Weight: 44.9 kg (99 lb) 48.4 kg (106 lb 11.2 oz)    Examination:  General exam: Thinly built frail female, AAO 3, no distress Respiratory system: Clear to auscultation. Respiratory effort normal. Cardiovascular system: S1 & S2 heard, RRR. No JVD, murmurs, rubs, gallops or clicks. No pedal edema. Gastrointestinal system: Abdomen is nondistended, soft and nontender. No organomegaly or masses felt. Normal bowel sounds heard. Central nervous system: Alert and oriented. No focal neurological deficits. Extremities: Symmetric 5 x  5 power. Skin: No rashes, lesions or ulcers Psychiatry: Judgement and insight appear normal. Mood & affect appropriate.     Data Reviewed: I have personally reviewed following labs and imaging studies  CBC:  Recent Labs Lab 08/18/16 1205 08/18/16 1242 08/19/16 0744  WBC 7.1  --  3.5*  NEUTROABS 5.5  --   --   HGB 8.4* 13.3 9.9*  HCT 24.7* 39.0 31.9*  MCV 92.5  --  95.5  PLT 145*  --  188    Basic Metabolic Panel:  Recent Labs Lab 08/18/16 1205 08/18/16 1242 08/19/16 0744  NA 142 142 140  K 5.1 5.1 3.9  CL 110 114* 113*  CO2 21*  --  22  GLUCOSE 99 91 73  BUN 18 23* 8  CREATININE 0.72 0.70 0.51  CALCIUM 9.8  --  8.7*   GFR: Estimated Creatinine Clearance: 57.8 mL/min (by C-G formula based on SCr of 0.51 mg/dL). Liver Function Tests:  Recent Labs Lab 08/18/16 1205  AST 16  ALT 8*  ALKPHOS 53  BILITOT 0.3  PROT 7.0  ALBUMIN 4.0   No results for input(s): LIPASE, AMYLASE in the last 168 hours. No results for input(s): AMMONIA in the last 168 hours. Coagulation Profile:  Recent Labs Lab 08/18/16 1205  INR 1.34   Cardiac Enzymes:  Recent Labs Lab 08/18/16 1921 08/19/16 0116 08/19/16 0744  TROPONINI <0.03 <0.03 <0.03   BNP (last 3 results) No results for input(s): PROBNP in the last 8760 hours. HbA1C: No results for input(s): HGBA1C in the last 72 hours. CBG:  Recent Labs Lab 08/18/16 1610  GLUCAP 116*   Lipid Profile: No results for input(s): CHOL, HDL, LDLCALC, TRIG, CHOLHDL, LDLDIRECT in the last 72 hours. Thyroid Function Tests: No results for input(s): TSH, T4TOTAL, FREET4, T3FREE, THYROIDAB in the last 72 hours. Anemia Panel: No results for input(s): VITAMINB12, FOLATE, FERRITIN, TIBC, IRON, RETICCTPCT in the last 72 hours. Urine analysis:    Component Value Date/Time   COLORURINE YELLOW 08/18/2016 1418   APPEARANCEUR CLEAR 08/18/2016 1418   LABSPEC 1.010 08/18/2016 1418   PHURINE 5.0 08/18/2016 1418   GLUCOSEU NEGATIVE 08/18/2016 1418   HGBUR NEGATIVE 08/18/2016 1418   BILIRUBINUR NEGATIVE 08/18/2016 1418   KETONESUR NEGATIVE 08/18/2016 1418   PROTEINUR NEGATIVE 08/18/2016 1418   UROBILINOGEN 0.2 04/22/2015 0931   NITRITE NEGATIVE 08/18/2016 1418   LEUKOCYTESUR NEGATIVE 08/18/2016 1418   Sepsis Labs: @LABRCNTIP (procalcitonin:4,lacticidven:4)  )No results found for this or any previous visit (from the past 240  hour(s)).       Radiology Studies: Ct Angio Head W Or Wo Contrast  Result Date: 08/18/2016 CLINICAL DATA:  Slurred speech and drooling.  Chest pain EXAM: CT ANGIOGRAPHY HEAD AND NECK TECHNIQUE: Multidetector CT imaging of the head and neck was performed using the standard protocol during bolus administration of intravenous contrast. Multiplanar CT image reconstructions and MIPs were obtained to evaluate the vascular anatomy. Carotid stenosis measurements (when applicable) are obtained utilizing NASCET criteria, using the distal internal carotid diameter as the denominator. CONTRAST:  100 mL Isovue 370 IV COMPARISON:  CT head 08/18/2016 FINDINGS: CTA NECK FINDINGS Aortic arch: Minimal atherosclerotic calcification aortic arch. Negative for aneurysm or dissection. Proximal great vessels widely patent. Right carotid system: Normal right carotid without significant atherosclerotic disease or stenosis. No dissection. Left carotid system: Mild atherosclerotic calcification in the carotid bulb without significant stenosis. Vertebral arteries: Left vertebral artery dominant. Mild stenosis at the origin. Mild atherosclerotic calcification distal left vertebral artery without  significant stenosis Nondominant right vertebral artery. Mild to moderate stenosis at the origin. Right vertebral artery is patent through the remainder of its course and contributes to the basilar. Skeleton: Negative Other neck: Negative Upper chest: Chest CT from today reported separately Review of the MIP images confirms the above findings CTA HEAD FINDINGS Anterior circulation: Mild atherosclerotic calcification in the cavernous carotid bilaterally. No significant stenosis of the carotid artery through the cavernous segment. Anterior and middle cerebral arteries widely patent bilaterally without stenosis or occlusion. Posterior circulation: Both vertebral arteries patent to the basilar. PICA patent bilaterally. Right AICA patent. Basilar  widely patent. Superior cerebellar and posterior cerebral arteries patent bilaterally. Venous sinuses: Widely patent. Anatomic variants: None Delayed phase: Normal enhancement on delayed imaging. Negative for mass lesion. Review of the MIP images confirms the above findings IMPRESSION: Carotid artery widely patent bilaterally without significant stenosis. Mild atherosclerotic disease at the left carotid bifurcation and in the cavernous segment bilaterally Mild stenosis at the origin the left vertebral artery and mild to moderate stenosis at the origin of the right vertebral artery. No significant intracranial stenosis. Electronically Signed   By: Marlan Palau M.D.   On: 08/18/2016 15:52   Ct Head Wo Contrast  Result Date: 08/18/2016 CLINICAL DATA:  Blurry vision Celsius, slurred speech EXAM: CT HEAD WITHOUT CONTRAST TECHNIQUE: Contiguous axial images were obtained from the base of the skull through the vertex without intravenous contrast. COMPARISON:  11/11/2014 FINDINGS: Brain: No intracranial hemorrhage, mass effect or midline shift. No acute cortical infarction. No mass lesion is noted on this unenhanced scan. Vascular: Mild atherosclerotic calcifications of carotid siphon. Skull: No skull fracture is noted. Sinuses/Orbits: No acute finding. Other: None IMPRESSION: No acute intracranial abnormality. No significant change. No definite acute cortical infarction. Electronically Signed   By: Natasha Mead M.D.   On: 08/18/2016 13:26   Ct Angio Neck W And/or Wo Contrast  Result Date: 08/18/2016 CLINICAL DATA:  Slurred speech and drooling.  Chest pain EXAM: CT ANGIOGRAPHY HEAD AND NECK TECHNIQUE: Multidetector CT imaging of the head and neck was performed using the standard protocol during bolus administration of intravenous contrast. Multiplanar CT image reconstructions and MIPs were obtained to evaluate the vascular anatomy. Carotid stenosis measurements (when applicable) are obtained utilizing NASCET  criteria, using the distal internal carotid diameter as the denominator. CONTRAST:  100 mL Isovue 370 IV COMPARISON:  CT head 08/18/2016 FINDINGS: CTA NECK FINDINGS Aortic arch: Minimal atherosclerotic calcification aortic arch. Negative for aneurysm or dissection. Proximal great vessels widely patent. Right carotid system: Normal right carotid without significant atherosclerotic disease or stenosis. No dissection. Left carotid system: Mild atherosclerotic calcification in the carotid bulb without significant stenosis. Vertebral arteries: Left vertebral artery dominant. Mild stenosis at the origin. Mild atherosclerotic calcification distal left vertebral artery without significant stenosis Nondominant right vertebral artery. Mild to moderate stenosis at the origin. Right vertebral artery is patent through the remainder of its course and contributes to the basilar. Skeleton: Negative Other neck: Negative Upper chest: Chest CT from today reported separately Review of the MIP images confirms the above findings CTA HEAD FINDINGS Anterior circulation: Mild atherosclerotic calcification in the cavernous carotid bilaterally. No significant stenosis of the carotid artery through the cavernous segment. Anterior and middle cerebral arteries widely patent bilaterally without stenosis or occlusion. Posterior circulation: Both vertebral arteries patent to the basilar. PICA patent bilaterally. Right AICA patent. Basilar widely patent. Superior cerebellar and posterior cerebral arteries patent bilaterally. Venous sinuses: Widely patent. Anatomic variants: None Delayed  phase: Normal enhancement on delayed imaging. Negative for mass lesion. Review of the MIP images confirms the above findings IMPRESSION: Carotid artery widely patent bilaterally without significant stenosis. Mild atherosclerotic disease at the left carotid bifurcation and in the cavernous segment bilaterally Mild stenosis at the origin the left vertebral artery and  mild to moderate stenosis at the origin of the right vertebral artery. No significant intracranial stenosis. Electronically Signed   By: Marlan Palau M.D.   On: 08/18/2016 15:52   Mr Brain Wo Contrast  Result Date: 08/19/2016 CLINICAL DATA:  60 y/o  F; left facial droop and stumbling gait. EXAM: MRI HEAD WITHOUT CONTRAST MRA HEAD WITHOUT CONTRAST TECHNIQUE: Multiplanar, multiecho pulse sequences of the brain and surrounding structures were obtained without intravenous contrast. Angiographic images of the head were obtained using MRA technique without contrast. COMPARISON:  08/18/2016 CT angiogram of the head and neck. 11/22/2014 MRI of the head. FINDINGS: MRI HEAD FINDINGS Brain: No acute infarction, hemorrhage, hydrocephalus, extra-axial collection or mass lesion. Few nonspecific foci of T2 FLAIR hyperintensity in subcortical and periventricular white matter within the pons are compatible with mild chronic microvascular ischemic changes. Mild parenchymal volume loss. Vascular: See below. Skull and upper cervical spine: Normal marrow signal. Sinuses/Orbits: Mild mucosal thickening of frontal and right greater than left anterior ethmoid sinuses. No abnormal signal of the mastoid air cells. Bilateral intra-ocular lens replacement. Other: None. MRA HEAD FINDINGS Internal carotid arteries: Patent. Anterior cerebral arteries: Patent. Middle cerebral arteries: Patent. Anterior communicating artery: Not identified, likely hypoplastic or absent. Posterior communicating arteries: Not identified, likely hypoplastic or absent. Posterior cerebral arteries: Patent. Basilar artery: Patent. Vertebral arteries: Patent. No evidence of high-grade stenosis, large vessel occlusion, or aneurysm unless noted above. IMPRESSION: 1. No evidence of acute infarct, hemorrhage, or focal mass effect. 2. Patent circle of Willis without evidence for high-grade stenosis, large vessel occlusion, or aneurysm. 3. Mild chronic microvascular  ischemic changes and parenchymal volume loss are stable. 4. Mild paranasal sinus disease. Electronically Signed   By: Mitzi Hansen M.D.   On: 08/19/2016 02:49   Mr Maxine Glenn Head/brain ZO Cm  Result Date: 08/19/2016 CLINICAL DATA:  60 y/o  F; left facial droop and stumbling gait. EXAM: MRI HEAD WITHOUT CONTRAST MRA HEAD WITHOUT CONTRAST TECHNIQUE: Multiplanar, multiecho pulse sequences of the brain and surrounding structures were obtained without intravenous contrast. Angiographic images of the head were obtained using MRA technique without contrast. COMPARISON:  08/18/2016 CT angiogram of the head and neck. 11/22/2014 MRI of the head. FINDINGS: MRI HEAD FINDINGS Brain: No acute infarction, hemorrhage, hydrocephalus, extra-axial collection or mass lesion. Few nonspecific foci of T2 FLAIR hyperintensity in subcortical and periventricular white matter within the pons are compatible with mild chronic microvascular ischemic changes. Mild parenchymal volume loss. Vascular: See below. Skull and upper cervical spine: Normal marrow signal. Sinuses/Orbits: Mild mucosal thickening of frontal and right greater than left anterior ethmoid sinuses. No abnormal signal of the mastoid air cells. Bilateral intra-ocular lens replacement. Other: None. MRA HEAD FINDINGS Internal carotid arteries: Patent. Anterior cerebral arteries: Patent. Middle cerebral arteries: Patent. Anterior communicating artery: Not identified, likely hypoplastic or absent. Posterior communicating arteries: Not identified, likely hypoplastic or absent. Posterior cerebral arteries: Patent. Basilar artery: Patent. Vertebral arteries: Patent. No evidence of high-grade stenosis, large vessel occlusion, or aneurysm unless noted above. IMPRESSION: 1. No evidence of acute infarct, hemorrhage, or focal mass effect. 2. Patent circle of Willis without evidence for high-grade stenosis, large vessel occlusion, or aneurysm. 3. Mild chronic microvascular  ischemic  changes and parenchymal volume loss are stable. 4. Mild paranasal sinus disease. Electronically Signed   By: Mitzi Hansen M.D.   On: 08/19/2016 02:49   Ct Angio Chest Aorta W And/or Wo Contrast  Result Date: 08/18/2016 CLINICAL DATA:  60 year old female with intermittent left chest pain for 1 week. Pain radiating to the left shoulder blade. Initial encounter. EXAM: CT ANGIOGRAPHY CHEST WITH CONTRAST TECHNIQUE: Multidetector CT imaging of the chest was performed using the standard protocol during bolus administration of intravenous contrast. Multiplanar CT image reconstructions and MIPs were obtained to evaluate the vascular anatomy. CONTRAST:  100 mL Isovue 370 COMPARISON:  Chest CTA 11/19/2014 FINDINGS: Cardiovascular: Good contrast bolus timing in the aorta and also in the pulmonary arterial system. No pericardial effusion. Calcified aortic atherosclerosis. Calcified coronary artery atherosclerosis. No thoracic aortic dissection or aneurysm. Proximal great vessels are patent. No focal filling defect identified in the pulmonary arteries to suggest acute pulmonary embolism. Mediastinum/Nodes: No lymphadenopathy. Lungs/Pleura: Minimal retained secretions in the right mainstem bronchus. Mild bilateral lower lobe peribronchial thickening. Right greater than left lower lobe curvilinear opacity which most resembles subsegmental atelectasis. Mild depending ground-glass opacity also felt due to atelectasis. No other abnormal pulmonary opacity. No pleural effusion. Upper Abdomen: No proximal abdominal aortic dissection or aneurysm. Calcified atherosclerosis at the origins of the abdominal aortic branches. Negative visualized liver, gallbladder, spleen, pancreas, adrenal glands, kidneys, and bowel in the upper abdomen. Musculoskeletal: Chronic posterior lateral left seventh through ninth rib fractures. No acute osseous abnormality identified. Review of the MIP images confirms the above findings. IMPRESSION:  1. No thoracic aortic dissection or aneurysm. No evidence of acute pulmonary embolus. Calcified atherosclerosis of the aorta and coronary arteries. 2. Bilateral lower lobe peribronchial thickening which may be inflammatory. Scattered lower lobe subsegmental atelectasis. 3. Chronic posterior left rib fractures. Electronically Signed   By: Odessa Fleming M.D.   On: 08/18/2016 15:49        Scheduled Meds: . aspirin EC  81 mg Oral Daily  . enoxaparin (LOVENOX) injection  30 mg Subcutaneous Q24H  . ezetimibe  10 mg Oral Daily  . midodrine  5 mg Oral BID WC  . mometasone-formoterol  2 puff Inhalation BID  . pantoprazole  40 mg Oral Daily   Continuous Infusions:    LOS: 0 days    Time spent:    Zannie Cove, MD [facility] Pager [phone]  If 7PM-7AM, please contact night-coverage [email or web address] Password [email or web address] 08/19/2016, 1:32 PM

## 2016-08-19 NOTE — Evaluation (Signed)
Physical Therapy Evaluation Patient Details Name: [name] MRN: [id number] DOB: 02-24-1956 Today's Date: 08/19/2016   History of Present Illness  60 y.o.femalewith a history of COPD, tobacco use, HTN, HLD, carotid stenosis s/p CEA 2016, gastric ulcer and chronic iron-deficiency anemia presenting with L chest pain, light-headedness, dysarthria, L facial droop with drooling, and stumbling gait. She also noted several weeks of generalized weakness and low blood pressure worsening in the past week. MRI on 11/2 negative for acute infarct.    Clinical Impression  Pt admitted with above diagnosis. Patient limited by increasing feeling of weakness and vision becoming blurry while walking. SBP did decrease, however technically not orthostatic (119 down to 112). Patient with decline in balance as she fatigued with losses of balance.  Pt currently with functional limitations due to the deficits listed below (see PT Problem List).  Pt will benefit from skilled PT to increase their independence and safety with mobility to allow discharge to the venue listed below.       Follow Up Recommendations Home health PT (home safety evaluation)    Equipment Recommendations  None recommended by PT    Recommendations for Other Services       Precautions / Restrictions Precautions Precautions: Fall Precaution Comments: watch BP Restrictions Weight Bearing Restrictions: No      Mobility  Bed Mobility Overal bed mobility: Needs Assistance Bed Mobility: Supine to Sit;Sit to Supine     Supine to sit: Supervision;HOB elevated Sit to supine: Modified independent (Device/Increase time)   General bed mobility comments: Supervision for safety due to low BP. Pt reports blrry vision and feeling weak  Transfers Overall transfer level: Needs assistance Equipment used: None Transfers: Sit to/from [employer] Sit to Stand: Min guard Stand pivot transfers: Min guard       General  transfer comment: closeguard due to BP issues and symptomatic  Ambulation/Gait Ambulation/Gait assistance: Min assist Ambulation Distance (Feet): 120 Feet Assistive device: Rolling walker (2 wheeled) Gait Pattern/deviations: Step-through pattern;Trunk flexed   Gait velocity interpretation: Below normal speed for age/gender General Gait Details: RW slightly too far ahead; loss of balance when talking and let go of RW with one hand  Stairs            Wheelchair Mobility    Modified Rankin (Stroke Patients Only)       Balance Overall balance assessment: Needs assistance Sitting-balance support: No upper extremity supported;Feet supported Sitting balance-Leahy Scale: Good     Standing balance support: No upper extremity supported Standing balance-Leahy Scale: Poor Standing balance comment: with prolonged standing for BP she began to sway                             Pertinent Vitals/Pain See vitals flow sheet.   Pain Assessment: No/denies pain    Home Living Family/patient expects to be discharged to:: Private residence Living Arrangements: Spouse/significant other Available Help at Discharge: Family;Available PRN/intermittently (husband works during the day) Type of Home: House Home Access: Stairs to enter   [employer] of Steps: 2 Home Layout: One level Home Equipment: Environmental consultant - 4 wheels;Cane - single point      Prior Function Level of Independence: Independent         Comments: reports 5 falls in 6 months due to weakness, imbalance     Hand Dominance   Dominant Hand: Right    Extremity/Trunk Assessment   Upper Extremity Assessment: Overall WFL for tasks assessed  Lower Extremity Assessment: Overall WFL for tasks assessed      Cervical / Trunk Assessment: Normal  Communication   Communication: No difficulties  Cognition Arousal/Alertness: Awake/alert Behavior During Therapy: WFL for tasks  assessed/performed Overall Cognitive Status: Within Functional Limits for tasks assessed                      General Comments General comments (skin integrity, edema, etc.): educated on exercises in supine and sitting to "boost BP" prior to standing; limiting static standing; possible use of compresssion stockings or abdominal binder    Exercises General Exercises - Upper Extremity Shoulder Horizontal ADduction: AROM;Both;10 reps Elbow Flexion: Strengthening;Both;5 reps Elbow Extension: Strengthening;Both;5 reps Digit Composite Flexion: AROM;Both;15 reps (hand pumps)   Assessment/Plan    PT Assessment Patient needs continued PT services  PT Problem List Decreased activity tolerance;Decreased balance;Decreased mobility;Decreased knowledge of use of DME;Decreased safety awareness;Decreased knowledge of precautions;Cardiopulmonary status limiting activity          PT Treatment Interventions DME instruction;Gait training;Functional mobility training;Therapeutic activities;Therapeutic exercise;Balance training;Patient/family education    PT Goals (Current goals can be found in the Care Plan section)  Acute Rehab PT Goals Patient Stated Goal: not feel so weak/tired PT Goal Formulation: With patient Time For Goal Achievement: 08/24/16 Potential to Achieve Goals: Good    Frequency Min 3X/week   Barriers to discharge        Co-evaluation               End of Session Equipment Utilized During Treatment: Gait belt Activity Tolerance: Treatment limited secondary to medical complications (Comment) (limited by decreasing BP) Patient left: in bed;with call bell/phone within reach;with bed alarm set      Functional Assessment Tool Used: clinical judgement Functional Limitation: Mobility: Walking and moving around Mobility: Walking and Moving Around Current Status (Z6109): At least 20 percent but less than 40 percent impaired, limited or restricted Mobility: Walking and  Moving Around Goal Status [phone]): At least 1 percent but less than 20 percent impaired, limited or restricted    Time: 1013-1102 PT Time Calculation (min) (ACUTE ONLY): 49 min   Charges:   PT Evaluation $PT Eval Moderate Complexity: 1 Procedure PT Treatments $Gait Training: 8-22 mins $Therapeutic Activity: 8-22 mins   PT G Codes:   PT G-Codes **NOT FOR INPATIENT CLASS** Functional Assessment Tool Used: clinical judgement Functional Limitation: Mobility: Walking and moving around Mobility: Walking and Moving Around Current Status (U9811): At least 20 percent but less than 40 percent impaired, limited or restricted Mobility: Walking and Moving Around Goal Status [phone]): At least 1 percent but less than 20 percent impaired, limited or restricted    Domnick Chervenak 08/19/2016, 1:36 PM  Pager [phone]

## 2016-08-19 NOTE — Progress Notes (Signed)
Initial Nutrition Assessment  DOCUMENTATION CODES:   Severe malnutrition in context of chronic illness  INTERVENTION:   - Provide Ensure Enlive oral nutrition supplement BID. Each provides 350 kcal and 20 grams protein. - Encourage PO intake.  NUTRITION DIAGNOSIS:   Malnutrition related to chronic illness as evidenced by severe depletion of muscle mass, severe depletion of body fat.  GOAL:   Patient will meet greater than or equal to 90% of their needs  MONITOR:   Supplement acceptance, PO intake, Weight trends, Labs  REASON FOR ASSESSMENT:   Malnutrition Screening Tool   ASSESSMENT:   [name] is a 60 y.o. female with a history of COPD, tobacco use, HTN, HLD, carotid stenosis s/p CEA 2016, gastric ulcer and chronic iron-deficiency anemia who was sent from her PCP for CVA evaluation. She has had decreased appetite and chronic weight loss as well.  Per RN note, pt passed bedside swallowing screen and is consuming approximately 50% of meals.  Spoke with pt at bedside who reports poor appetite, being a "picky eater," and early satiety for approximately 1 year. PTA pt consumed 2 meals per day with breakfast consisting of eggs and oatmeal. Pt would normally eat dinner at a restaurant with her husband and would have chicken and dumplings or something similar. Pt states she enjoys snacking on peanut butter, honey buns, and milkshake-type drinks from restaurants. Pt states she ambulates well around her house and does most of the cooking. Pt reports consuming an Ensure oral nutrition supplement once daily PTA. Pt amenable to receiving Ensure Enlive oral nutrition supplements BID during hospitalization.  Suspect second weight after admission is incorrect. Pt reports UBW of 116-120# and that she last weighed this approximately 2 years ago. Pt is unsure as to why she has lost so much weight.  Medications reviewed and include 30 mg Lovenox daily, 10 mg Zetia daily, 40 mg Protonix daily    Labs reviewed and include elevated chloride (113 mmol/L), low calcium (8.7 mg/dL), low hemoglobin (9.9 g/dL) CBG: 116 mg/dL  NFPE: Exam completed. Moderate to severe fat depletion, moderate to severe muscle depletion, and no edema noted.  Diet Order:  Diet Heart Room service appropriate? Yes; Fluid consistency: Thin  Skin:  Reviewed, no issues  Last BM:  08/17/16  Height:   Ht Readings from Last 1 Encounters:  08/19/16 5\' 1"  (1.549 m)    Weight:   Wt Readings from Last 1 Encounters:  08/19/16 106 lb 11.2 oz (48.4 kg)    Ideal Body Weight:  47.7 kg  BMI:  Body mass index is 20.16 kg/m.  Estimated Nutritional Needs:   Kcal:  1300-1500 kcal (27-31 kcal/kg)  Protein:  63-70 grams (1.3-1.4 g/kg)  Fluid:  >/= 1.5 L/day  EDUCATION NEEDS:   No education needs identified at this time  Jeb Levering Dietetic Intern Pager Number: [phone]

## 2016-08-19 NOTE — Evaluation (Signed)
Speech Language Pathology Evaluation Patient Details Name: [name] MRN: [id number] DOB: 1955/10/27 Today's Date: 08/19/2016 Time: 4742-5956 SLP Time Calculation (min) (ACUTE ONLY): 9 min  Problem List:  Patient Active Problem List   Diagnosis Date Noted  . Left-sided weakness 08/18/2016  . Iron deficiency anemia 07/20/2016  . Anemia 06/25/2016  . Mild malnutrition (HCC) 06/23/2016  . Osteoporosis 01/02/2015  . Depression, major, in remission (HCC) 01/02/2015  . Carotid stenosis, asymptomatic 12/16/2014  . HTN (hypertension) 10/03/2014  . Abnormal glucose 10/03/2014  . Medication management 10/03/2014  . COPD (chronic obstructive pulmonary disease) (HCC)   . Asthma   . Vitamin D deficiency   . COLONIC POLYPS 04/30/2008  . Mixed hyperlipidemia 04/30/2008  . Internal hemorrhoids 04/30/2008  . GERD 04/30/2008  . Gastric ulcer 04/30/2008  . Duodenal ulcer without hemorrhage or perforation and without obstruction 04/30/2008   Past Medical History:  Past Medical History:  Diagnosis Date  . Allergy   . Anemia   . Arthritis   . Asthma   . Blood transfusion without reported diagnosis   . Cataract   . COPD (chronic obstructive pulmonary disease) (HCC)   . Duodenal ulcer   . Emphysema of lung (HCC)   . Family history of malignant neoplasm of gastrointestinal tract   . GERD (gastroesophageal reflux disease)   . Hepatitis    had in 6th grade pt unsure which kind  . Hiatal hernia   . HTN (hypertension) 10/03/2014  . Hx of colonic polyps   . Hyperlipemia   . Internal hemorrhoids   . Osteoporosis   . Peripheral vascular disease (HCC)   . Pneumonia    hx of  . Shortness of breath dyspnea    with ambulation  . Vitamin D deficiency    Past Surgical History:  Past Surgical History:  Procedure Laterality Date  . ABDOMINAL HYSTERECTOMY    . APPENDECTOMY    . CATARACT EXTRACTION, BILATERAL    . COLONOSCOPY    . ELBOW SURGERY Right   . ENDARTERECTOMY Right 12/16/2014    Procedure: ENDARTERECTOMY CAROTID with Dacron patch;  Surgeon: Larina Earthly, MD;  [facility];  Service: Vascular;  Laterality: Right;  . ESOPHAGOGASTRODUODENOSCOPY  10/2008  . HAND SURGERY Left   . LUMBAR DISC SURGERY    . TOTAL HIP ARTHROPLASTY Right   . trigeminal neuralgia surgery    . UPPER GASTROINTESTINAL ENDOSCOPY    . WRIST SURGERY Left    HPI:  60 y.o. female with a history of COPD, tobacco use, HTN, HLD, carotid stenosis s/p CEA 2016, gastric ulcer and chronic iron-deficiency anemia presenting with L chest pain, light-headedness, dysarthria, L facial droop with drooling, and stumbling gait. She also noted several weeks of generalized weakness and low blood pressure worsening in the past week. MRI on 11/2 negative for acute infarct.   Assessment / Plan / Recommendation Clinical Impression  Pt scored within functional limits of subtests of Cognistat. Reported "difficulty moving my tongue yesterday" which has resolved. Safe for return home with spouse and no further ST needed.     SLP Assessment  Patient does not need any further Speech Lanaguage Pathology Services    Follow Up Recommendations  None    Frequency and Duration           SLP Evaluation Cognition  Overall Cognitive Status: Within Functional Limits for tasks assessed Orientation Level: Oriented X4 Safety/Judgment: Appears intact       Comprehension  Auditory Comprehension Overall Auditory Comprehension: Appears  within functional limits for tasks assessed Visual Recognition/Discrimination Discrimination: Not tested Reading Comprehension Reading Status: Within funtional limits    Expression Expression Primary Mode of Expression: Verbal Verbal Expression Overall Verbal Expression: Appears within functional limits for tasks assessed Written Expression Dominant Hand: Right Written Expression: Not tested   Oral / Motor  Oral Motor/Sensory Function Overall Oral Motor/Sensory Function: Within  functional limits Motor Speech Overall Motor Speech: Appears within functional limits for tasks assessed   GO          Functional Assessment Tool Used: skilled clinical judgement Functional Limitations: Memory Memory Current Status (X9147): 0 percent impaired, limited or restricted Memory Goal Status (W2956): 0 percent impaired, limited or restricted Memory Discharge Status (O1308): 0 percent impaired, limited or restricted         Royce Macadamia 08/19/2016, 1:19 PM   Breck Coons Antoney Biven M.Ed [employer] [phone]

## 2016-08-20 ENCOUNTER — Observation Stay (HOSPITAL_BASED_OUTPATIENT_CLINIC_OR_DEPARTMENT_OTHER): Payer: BLUE CROSS/BLUE SHIELD

## 2016-08-20 DIAGNOSIS — E43 Unspecified severe protein-calorie malnutrition: Secondary | ICD-10-CM | POA: Insufficient documentation

## 2016-08-20 DIAGNOSIS — R531 Weakness: Secondary | ICD-10-CM | POA: Diagnosis not present

## 2016-08-20 DIAGNOSIS — G459 Transient cerebral ischemic attack, unspecified: Secondary | ICD-10-CM | POA: Diagnosis not present

## 2016-08-20 DIAGNOSIS — I959 Hypotension, unspecified: Secondary | ICD-10-CM | POA: Diagnosis not present

## 2016-08-20 LAB — ECHOCARDIOGRAM COMPLETE
Height: 61 in
Weight: 1707.24 [oz_av]

## 2016-08-20 MED ORDER — MIDODRINE HCL 5 MG PO TABS: 5.0000 mg | ORAL_TABLET | Freq: Two times a day (BID) | ORAL | 0 refills | Status: DC

## 2016-08-20 NOTE — Progress Notes (Signed)
  Echocardiogram 2D Echocardiogram has been performed.  [name] 08/20/2016, 11:24 AM

## 2016-08-20 NOTE — Progress Notes (Signed)
STROKE TEAM PROGRESS NOTE   HISTORY OF PRESENT ILLNESS (per record) [name] is an 60 y.o. female with a history of COPD, tobacco use, HTN, HLD, carotid stenosis s/p CEA 2016, gastric ulcer and chronic iron-deficiency anemia who was sent from her PCP for CVA evaluation.   She notes sudden onset at 6:00pm 08/17/2016 (LKW) of midthoracic back pain while rising from seated position, associated with left chest pain and light-headedness. She has chronic dyspnea on exertion but notes none at this time. She later noticed dysarthria, left facial droop with drooling, and stumbling gait. These symptoms have waxed and waned, eventually largely resolving by the time of evaluation at PCP's office this morning. She was sent for concern of CVA. She also noted several weeks of generalized weakness and low blood pressures worsening in the past week (99991111 systolic) at home. She has had decreased appetite and chronic weight loss as well.   ED Course:On arrival the patient was in no distress, still complaining of some speech disturbance without other focal neurological deficits or chest pain or back pain. She was afebrile, hypotensive (98/46) with HR 79bpm. Right carotid bruit was noted and neurological exam was normal with the exception of subtle right lower extremity weakness per EDP. CT head showed no acute abnormalities. Further imaging ruled out aortic dissection as a cause of chest and thoracic back pain. BPs remained low.   While on the floor patient received 1 L fluid.. Patient was noted to be orthostatic on standing . Later during the shift patient was noted a blood pressure of 80/40. Manually it was rechecked 86/46. And patient received another bolus of his 500 mL of fluid.   MRI brain was obtained along with MRA of brain which showed no acute infarct. CTA of head and neck were also obtained showing no significant stenosis.  Currently patient is asymptomatic.  Patient admits to taking an aspirin on a  daily basis.  Patient was not administered IV t-PA. She was admitted further evaluation and treatment.   SUBJECTIVE (INTERVAL HISTORY) No family is at the bedside. Patient without complaints.   OBJECTIVE Temp:  [97.8 F (36.6 C)-98.5 F (36.9 C)] 98.5 F (36.9 C) (11/03 0950) Pulse Rate:  [63-97] 63 (11/03 0950) Cardiac Rhythm: Normal sinus rhythm (11/03 0700) Resp:  [16-20] 16 (11/03 0950) BP: (100-118)/(48-61) 111/61 (11/03 0950) SpO2:  [95 %-98 %] 96 % (11/03 0950)  CBC:  Recent Labs Lab 08/18/16 1205 08/18/16 1242 08/19/16 0744  WBC 7.1  --  3.5*  NEUTROABS 5.5  --   --   HGB 8.4* 13.3 9.9*  HCT 24.7* 39.0 31.9*  MCV 92.5  --  95.5  PLT 145*  --  0000000    Basic Metabolic Panel:  Recent Labs Lab 08/18/16 1205 08/18/16 1242 08/19/16 0744  NA 142 142 140  K 5.1 5.1 3.9  CL 110 114* 113*  CO2 21*  --  22  GLUCOSE 99 91 73  BUN 18 23* 8  CREATININE 0.72 0.70 0.51  CALCIUM 9.8  --  8.7*    Lipid Panel:    Component Value Date/Time   CHOL 170 06/23/2016 1014   TRIG 222 (H) 06/23/2016 1014   HDL 34 (L) 06/23/2016 1014   CHOLHDL 5.0 06/23/2016 1014   VLDL 44 (H) 06/23/2016 1014   LDLCALC 92 06/23/2016 1014   HgbA1c:  Lab Results  Component Value Date   HGBA1C 4.8 06/23/2016   Urine Drug Screen: No results found for: LABOPIA, COCAINSCRNUR, LABBENZ, AMPHETMU,  THCU, LABBARB    IMAGING  Ct Angio Head W Or Wo Contrast Ct Angio Neck W And/or Wo Contrast 08/18/2016 Carotid artery widely patent bilaterally without significant stenosis. Mild atherosclerotic disease at the left carotid bifurcation and in the cavernous segment bilaterally Mild stenosis at the origin the left vertebral artery and mild to moderate stenosis at the origin of the right vertebral artery. No significant intracranial stenosis.   Mr Brain Wo Contrast Mr Jodene Nam Head/brain Wo Cm 08/19/2016 1. No evidence of acute infarct, hemorrhage, or focal mass effect. 2. Patent circle of Willis without  evidence for high-grade stenosis, large vessel occlusion, or aneurysm. 3. Mild chronic microvascular ischemic changes and parenchymal volume loss are stable. 4. Mild paranasal sinus disease.   Ct Angio Chest Aorta W And/or Wo Contrast 08/18/2016 1. No thoracic aortic dissection or aneurysm. No evidence of acute pulmonary embolus. Calcified atherosclerosis of the aorta and coronary arteries. 2. Bilateral lower lobe peribronchial thickening which may be inflammatory. Scattered lower lobe subsegmental atelectasis. 3. Chronic posterior left rib fractures.   2D Echocardiogram  - Left ventricle: The cavity size was normal. Systolic function was normal. The estimated ejection fraction was in the range of 55% to 60%. Wall motion was normal; there were no regional wall motion abnormalities. Left ventricular diastolic function parameters were normal. - Aortic valve: Trileaflet; mildly thickened, mildly calcified leaflets. - Mitral valve: There was trivial regurgitation.   PHYSICAL EXAM Pleasant middle aged lady not in distress. . Afebrile. Head is nontraumatic. Neck is supple without bruit.    Cardiac exam no murmur or gallop. Lungs are clear to auscultation. Distal pulses are well felt. Neurological Exam ;  Awake  Alert oriented x 3. Normal speech and language.eye movements full without nystagmus.fundi were not visualized. Vision acuity and fields appear normal. Hearing is normal. Palatal movements are normal. Face symmetric. Tongue midline. Normal strength, tone, reflexes and coordination. Normal sensation. Gait deferred.  ASSESSMENT/PLAN Ms. [name] is a 60 y.o. female with history of COPD, tobacco use, HTN, HLD, carotid stenosis s/p CEA 2016, gastric ulcer and chronic iron-deficiency anemia presenting with AMS in setting of hypotension. She did not receive IV t-PA .   Altered mental status, no stroke or TIA  MRI  No acute infarct  MRA  No significant stenosis  CTA head and neck - L  cavernous stenosis, L VA and R VA stenosis.  2D Echo  EF 55-60%. No source of embolus   LDL not drawn  HgbA1c not drawn  Diet Heart Room service appropriate? Yes; Fluid consistency: Thin  aspirin 81 mg daily prior to admission, now on aspirin 81 mg daily. Continue at discharge  Patient counseled to be compliant with her antithrombotic medications  Therapy recommendations:  HH PT, no OT  Disposition:  pending   Hypotension  BP goal normotensive  Hyperlipidemia  Home meds:  zetia 10, resumed in hospital  LDL not drawn   Continue zetia at discharge  Other Stroke Risk Factors  Cigarette smoker, advised to stop smoking  Rare ETOH use, advised to drink no more than 1 drink(s) a day  Family hx stroke (mother)  PVD  Other Active Problems  Atypical CP  Chronic iron deficiency anemia  COPD  Hx gastric ulcer  Hospital day # 0  Radene Journey [facility] for Pager information 08/20/2016 3:14 PM  I have personally examined this patient, reviewed notes, independently viewed imaging studies, participated in medical decision making and plan of care.ROS completed  by me personally and pertinent positives fully documented  I have made any additions or clarifications directly to the above note. Agree with note above. She presented with transient speech difficulties, left facial droop and some anger in setting of low blood pressure and brain MRI shows no acute infarct. Patient was advised to quit smoking and she is agreeable. Recommend ongoing stroke evaluation. Greater than 50% time during this 35 minute visit was spent on counseling and coordination of care about stroke risk, prevention and treatment  Antony Contras, MD Medical Director [place] Pager: [phone] 08/20/2016 5:24 PM  To contact Stroke Continuity provider, please refer to [email or web address] After hours, contact General Neurology

## 2016-08-20 NOTE — Care Management Note (Signed)
Case Management Note  Patient Details  Name: [name] MRN: [id number] Date of Birth: 1956/09/28  Subjective/Objective:                    Action/Plan: Patient discharging home with orders for [facility] services. CM met with the patient to provide her a list of [facility] agencies. She stated she did not feel she needs HH PT and would like to refuse for now. CM encouraged her to call her PCP after discharge if she changed her mind and decided she did want HHPT. Bedside RN updated.   Expected Discharge Date:                  Expected Discharge Plan:  [place]  In-House Referral:     Discharge planning Services  CM Consult  Post Acute Care Choice:    Choice offered to:     DME Arranged:    DME Agency:     HH Arranged:   (pt refused) HH Agency:     Status of Service:  Completed, signed off  If discussed at [place] of Stay Meetings, dates discussed:    Additional Comments:  Pollie Friar, RN 08/20/2016, 4:36 PM

## 2016-08-20 NOTE — Progress Notes (Signed)
Physical Therapy Treatment Patient Details Name: [name] MRN: [id number] DOB: 1955-11-02 Today's Date: 08/20/2016    History of Present Illness 60 y.o.femalewith a history of COPD, tobacco use, HTN, HLD, carotid stenosis s/p CEA 2016, gastric ulcer and chronic iron-deficiency anemia presenting with L chest pain, light-headedness, dysarthria, L facial droop with drooling, and stumbling gait. She also noted several weeks of generalized weakness and low blood pressure worsening in the past week. MRI on 11/2 negative for acute infarct.    PT Comments    The pt is progressing toward all goals.  She ambulated further today and began stair training.  Pt continues to drift left/right occasionally during ambulation, but no LOB. Pt performed strengthening exercises prior to gait training.  BP remained WNL during session.  Continue with POC.  Follow Up Recommendations  Home health PT     Equipment Recommendations  None recommended by PT    Recommendations for Other Services       Precautions / Restrictions Precautions Precautions: Fall Precaution Comments: watch BP Restrictions Weight Bearing Restrictions: No    Mobility  Bed Mobility Overal bed mobility: Modified Independent Bed Mobility: Supine to Sit     Supine to sit: Modified independent (Device/Increase time) Sit to supine: Modified independent (Device/Increase time)   General bed mobility comments: HOB elevated. Pt able to perform all bed mobility safely without physical assist.  Transfers Overall transfer level: Needs assistance Equipment used: None Transfers: Sit to/from Stand Sit to Stand: Min guard         General transfer comment: min guard for safety  Ambulation/Gait Ambulation/Gait assistance: Min guard Ambulation Distance (Feet): 200 Feet Assistive device: None Gait Pattern/deviations: Step-through pattern;Drifts right/left     General Gait Details: no LOB but drifts  left/right.   Stairs Stairs: Yes Stairs assistance: Min guard Stair Management: One rail Left;Forwards;Alternating pattern Number of Stairs: 4 General stair comments: Min guard for safety.  VCs for technique and sequence.  Wheelchair Mobility    Modified Rankin (Stroke Patients Only)       Balance Overall balance assessment: Needs assistance Sitting-balance support: Feet supported;No upper extremity supported Sitting balance-Leahy Scale: Good     Standing balance support: No upper extremity supported;During functional activity Standing balance-Leahy Scale: Good                      Cognition Arousal/Alertness: Awake/alert Behavior During Therapy: WFL for tasks assessed/performed Overall Cognitive Status: Within Functional Limits for tasks assessed                      Exercises Total Joint Exercises Ankle Circles/Pumps: AROM;Both;10 reps;Supine Straight Leg Raises: AROM;Right;5 reps;Left;10 reps;Supine Long Arc Quad: AROM;Both;10 reps;Seated    General Comments        Pertinent Vitals/Pain Pain Assessment: 0-10 Pain Score: 2  Pain Location: R LE Pain Descriptors / Indicators: Sore Pain Intervention(s): Monitored during session    Home Living                      Prior Function            PT Goals (current goals can now be found in the care plan section) Acute Rehab PT Goals Patient Stated Goal: not feel so weak/tired PT Goal Formulation: With patient Time For Goal Achievement: 08/24/16 Potential to Achieve Goals: Good Additional Goals Additional Goal #1: Patient will demonstrate exercises she can do in supine and sitting prior to standing to minimize  orthostasis. Progress towards PT goals: Progressing toward goals    Frequency    Min 3X/week      PT Plan      Co-evaluation             End of Session Equipment Utilized During Treatment: Gait belt Activity Tolerance: Patient tolerated treatment well Patient  left: in chair;with call bell/phone within reach     Time: 1418-1440 PT Time Calculation (min) (ACUTE ONLY): 22 min  Charges:  $Gait Training: 8-22 mins                    G Codes:      Cathleen Corti 09/09/2016, 3:17 PM  [place]. Syon Tews, Leda Gauze [phone]

## 2016-08-20 NOTE — Progress Notes (Signed)
Occupational Therapy Treatment Patient Details Name: [name] MRN: [id number] DOB: 30-May-1956 Today's Date: 08/20/2016    History of present illness 60 y.o.femalewith a history of COPD, tobacco use, HTN, HLD, carotid stenosis s/p CEA 2016, gastric ulcer and chronic iron-deficiency anemia presenting with L chest pain, light-headedness, dysarthria, L facial droop with drooling, and stumbling gait. She also noted several weeks of generalized weakness and low blood pressure worsening in the past week. MRI on 11/2 negative for acute infarct.   OT comments  Pt making good progress toward OT goals this session. Pt able to perform functional mobility and tub transfer with supervision. Educated pt on fall prevention strategies and provided handout for fall prevention in the home. Pt with no c/o dizziness today and no LOB during functional activities; continues to demo mild unsteadiness with mobility. D/c plan remains appropriate. Will continue to follow acutely.   Follow Up Recommendations  No OT follow up;Supervision - Intermittent    Equipment Recommendations  None recommended by OT    Recommendations for Other Services      Precautions / Restrictions Precautions Precautions: Fall Restrictions Weight Bearing Restrictions: No       Mobility Bed Mobility Overal bed mobility: Modified Independent Bed Mobility: Supine to Sit;Sit to Supine     Supine to sit: Modified independent (Device/Increase time) Sit to supine: Modified independent (Device/Increase time)   General bed mobility comments: HOB elevated. Pt able to perform all bed mobility safely without physical assist.  Transfers Overall transfer level: Needs assistance Equipment used: None Transfers: Sit to/from Stand Sit to Stand: Supervision         General transfer comment: Supervision for safety; no physcial assist required. No LOB but pt with mild unsteadiness.    Balance Overall balance assessment: Needs  assistance Sitting-balance support: Feet supported;No upper extremity supported Sitting balance-Leahy Scale: Good     Standing balance support: No upper extremity supported;During functional activity Standing balance-Leahy Scale: Good                     ADL Overall ADL's : Needs assistance/impaired                         Toilet Transfer: Supervision/safety;Ambulation;Regular Teacher, adult education Details (indicate cue type and reason): Simulated by sit to stand from EOB with functional mobility.     Tub/ Shower Transfer: Supervision/safety;Tub transfer;Ambulation Tub/Shower Transfer Details (indicate cue type and reason): Pt able to demo tub transfer with supervision for safety. Pt reports she often takes baths; pt able to get down to bottom of tub and back up with supervision for safety. Functional mobility during ADLs: Supervision/safety General ADL Comments: Pt with better balance today, no LOB but with mild unsteadiness during functional activities this session. No c/o dizziness. Educated pt on fall prevention stratgies for home and provided hanout; pt verbalized understanding.      Vision                     Perception     Praxis      Cognition   Behavior During Therapy: [facility] for tasks assessed/performed Overall Cognitive Status: Within Functional Limits for tasks assessed                       Extremity/Trunk Assessment               Exercises     Shoulder Instructions  General Comments      Pertinent Vitals/ Pain       Pain Assessment: No/denies pain  Home Living                                          Prior Functioning/Environment              Frequency  Min 2X/week        Progress Toward Goals  OT Goals(current goals can now be found in the care plan section)  Progress towards OT goals: Progressing toward goals  Acute Rehab OT Goals Patient Stated Goal: not feel so  weak/tired OT Goal Formulation: With patient  Plan Discharge plan remains appropriate;Equipment recommendations need to be updated    Co-evaluation                 End of Session Equipment Utilized During Treatment: Gait belt   Activity Tolerance Patient tolerated treatment well   Patient Left in bed;with call bell/phone within reach   Nurse Communication          Time: 2130-8657 OT Time Calculation (min): 13 min  Charges: OT General Charges $OT Visit: 1 Procedure OT Treatments $Self Care/Home Management : 8-22 mins  Gaye Alken M.S., OTR/L Pager: [phone]  08/20/2016, 11:34 AM

## 2016-08-20 NOTE — Progress Notes (Signed)
Patient is discharged from room 5M20 at this time. Alert and in stable condition. Instructions read to patient with understanding verbalized. IV site d/c'd as well as tele. Left unit via wheelchair with family and all belongings at side.

## 2016-08-23 NOTE — Discharge Summary (Signed)
Physician Discharge Summary  [name] UEA:[id number] DOB: [birth year] DOA: 08/18/2016  PCP: Nadean Corwin, MD  Admit date: 08/18/2016 Discharge date: 08/20/2016  Time spent:45 minutes  Recommendations for Outpatient Follow-up:  1. PCP Dr.McKeown in 1 week, started on midodrine    Discharge Diagnoses:    Hypotension   Iron deficiency anemia   Left-sided weakness   TIA (transient ischemic attack)   Hypotension   Protein-calorie malnutrition, severe   Discharge Condition: improved  Diet recommendation: regular  Filed Weights   08/18/16 1208 08/19/16 0330  Weight: 44.9 kg (99 lb) 48.4 kg (106 lb 11.2 oz)    History of present illness:  Brenda Stootsis a 60 y.o.femalewith a history of COPD, tobacco use, HTN, HLD, carotid stenosis s/p CEA 2016, gastric ulcer and chronic iron-deficiency anemia who was sent from her PCP for CVA evaluation. Yesterday at 6pm she noticed midthoracic back pain while rising from seated position, associated with left chest pain and light-headedness. She later noticed dysarthria, left facial droop with drooling, and stumbling gait. These symptoms have waxed and waned, eventually largely resolving by the time of evaluation at PCP's office this morning. She was sent for concern of CVA. She reports long h/o Hypotension since R CEA 2years back, but several weeks of generalized weakness and low blood pressures worsening in the past week (80's systolic) at home. She has had decreased appetite and chronic weight loss as well  Hospital Course:  1. Transient neurological symptoms related to hypotension/hypoperfusion -resolved -MRI/MRA unremarkable for acute event -2D ECHO with normal EF and wall motion -continue ASA -PT/OT eval completed and HH recommended and set up  2. Chronic hypotension -pt recalls ongoing low/soft blood pressures since 2 years ago following carotid endarterectomy, unclear if potentially could've caused some damage to her  baroreceptors -Given IVF plus fluid boluses initially -Clinically no evidence of sepsis or dehydration at this time -Random morning cortisol level within acceptable range, recent TSH was normal -2-D echocardiogram was normal -started on low-dose midodrine 5mg  BID and BP improved in 100s to 110s range -Advised patient to consider compression stockings and caution with position changes, and liberalizing salt intake  3. Atypical chest pain -Transient on day of admission in the setting of hypotension -EKG without acute findings -Troponin negative -2-D echocardiogram normal as well   4. Chronic iron deficiency anemia -Followed by Dr. Georgiann Mohs and Dr. Yancey Flemings -Last baseline hemoglobins in the mid 8 range, had extensive GI workup this year -On IV iron infusions -Improving, Hb in mid 9range now  5. Tobacco abuse -Counseled  6. COPD -stable  7. History of gastric ulcer -Continue PPI  Consultations:  Neurology  Discharge Exam: Vitals:   08/20/16 0950 08/20/16 1414  BP: 111/61 (!) 114/53  Pulse: 63 69  Resp: 16 16  Temp: 98.5 F (36.9 C) 98.8 F (37.1 C)    General: AAOx3 Cardiovascular: S1S2/RRR Respiratory: CTAB  Discharge Instructions   Discharge Instructions    Diet - low sodium heart healthy    Complete by:  As directed    Increase activity slowly    Complete by:  As directed      Discharge Medication List as of 08/20/2016  5:57 PM    START taking these medications   Details  midodrine (PROAMATINE) 5 MG tablet Take 1 tablet (5 mg total) by mouth 2 (two) times daily with a meal., Starting Fri 08/20/2016, Print      CONTINUE these medications which have NOT CHANGED   Details  acetaminophen (TYLENOL)  500 MG tablet Take 500 mg by mouth every 6 (six) hours as needed for moderate pain or headache. , Until Discontinued, Historical Med    albuterol (PROVENTIL HFA;VENTOLIN HFA) 108 (90 BASE) MCG/ACT inhaler Inhale 2 puffs into the lungs every 6 (six) hours as  needed for wheezing or shortness of breath., Starting 11/16/2013, Until Discontinued, Normal    aspirin EC 81 MG tablet Take 1 tablet (81 mg total) by mouth daily., Starting 12/16/2014, Until Discontinued, OTC    cetirizine (ZYRTEC) 10 MG tablet Take 10 mg by mouth as needed for allergies., Until Discontinued, Historical Med    Cholecalciferol (VITAMIN D) 2000 UNITS CAPS Take 7000 units a day , Until Discontinued, Historical Med    ezetimibe (ZETIA) 10 MG tablet Take 1 tablet (10 mg total) by mouth daily., Starting Wed 07/09/2015, Print    FeFum-FePoly-FA-B Cmp-C-Biot (INTEGRA PLUS) CAPS Take 1 capsule by mouth daily., Starting Wed 12/10/2015, Normal    Fluticasone-Salmeterol (ADVAIR DISKUS) 250-50 MCG/DOSE AEPB Inhale 1 puff into the lungs every 12 (twelve) hours., Until Discontinued, Historical Med    omeprazole (PRILOSEC) 40 MG capsule TAKE 1 CAPSULE (40 MG TOTAL) BY MOUTH 2 (TWO) TIMES DAILY., Normal      STOP taking these medications     dicyclomine (BENTYL) 10 MG capsule        Allergies  Allergen Reactions  . Celebrex [Celecoxib]     GI bleed  . Crestor [Rosuvastatin]     Made legs hurt  . Penicillins     REACTION: whelps  . Pravastatin     Muscle aches   Follow-up Information    MCKEOWN,WILLIAM DAVID, MD. Schedule an appointment as soon as possible for a visit in 1 week(s).   Specialty:  Internal Medicine Contact information: [address] [place] [address] [phone]            The results of significant diagnostics from this hospitalization (including imaging, microbiology, ancillary and laboratory) are listed below for reference.    Significant Diagnostic Studies: Ct Angio Head W Or Wo Contrast  Result Date: 08/18/2016 CLINICAL DATA:  Slurred speech and drooling.  Chest pain EXAM: CT ANGIOGRAPHY HEAD AND NECK TECHNIQUE: Multidetector CT imaging of the head and neck was performed using the standard protocol during bolus administration  of intravenous contrast. Multiplanar CT image reconstructions and MIPs were obtained to evaluate the vascular anatomy. Carotid stenosis measurements (when applicable) are obtained utilizing NASCET criteria, using the distal internal carotid diameter as the denominator. CONTRAST:  100 mL Isovue 370 IV COMPARISON:  CT head 08/18/2016 FINDINGS: CTA NECK FINDINGS Aortic arch: Minimal atherosclerotic calcification aortic arch. Negative for aneurysm or dissection. Proximal great vessels widely patent. Right carotid system: Normal right carotid without significant atherosclerotic disease or stenosis. No dissection. Left carotid system: Mild atherosclerotic calcification in the carotid bulb without significant stenosis. Vertebral arteries: Left vertebral artery dominant. Mild stenosis at the origin. Mild atherosclerotic calcification distal left vertebral artery without significant stenosis Nondominant right vertebral artery. Mild to moderate stenosis at the origin. Right vertebral artery is patent through the remainder of its course and contributes to the basilar. Skeleton: Negative Other neck: Negative Upper chest: Chest CT from today reported separately Review of the MIP images confirms the above findings CTA HEAD FINDINGS Anterior circulation: Mild atherosclerotic calcification in the cavernous carotid bilaterally. No significant stenosis of the carotid artery through the cavernous segment. Anterior and middle cerebral arteries widely patent bilaterally without stenosis or occlusion. Posterior circulation: Both vertebral  arteries patent to the basilar. PICA patent bilaterally. Right AICA patent. Basilar widely patent. Superior cerebellar and posterior cerebral arteries patent bilaterally. Venous sinuses: Widely patent. Anatomic variants: None Delayed phase: Normal enhancement on delayed imaging. Negative for mass lesion. Review of the MIP images confirms the above findings IMPRESSION: Carotid artery widely patent  bilaterally without significant stenosis. Mild atherosclerotic disease at the left carotid bifurcation and in the cavernous segment bilaterally Mild stenosis at the origin the left vertebral artery and mild to moderate stenosis at the origin of the right vertebral artery. No significant intracranial stenosis. Electronically Signed   By: Marlan Palau M.D.   On: 08/18/2016 15:52   Ct Head Wo Contrast  Result Date: 08/18/2016 CLINICAL DATA:  Blurry vision Celsius, slurred speech EXAM: CT HEAD WITHOUT CONTRAST TECHNIQUE: Contiguous axial images were obtained from the base of the skull through the vertex without intravenous contrast. COMPARISON:  11/11/2014 FINDINGS: Brain: No intracranial hemorrhage, mass effect or midline shift. No acute cortical infarction. No mass lesion is noted on this unenhanced scan. Vascular: Mild atherosclerotic calcifications of carotid siphon. Skull: No skull fracture is noted. Sinuses/Orbits: No acute finding. Other: None IMPRESSION: No acute intracranial abnormality. No significant change. No definite acute cortical infarction. Electronically Signed   By: Natasha Mead M.D.   On: 08/18/2016 13:26   Ct Angio Neck W And/or Wo Contrast  Result Date: 08/18/2016 CLINICAL DATA:  Slurred speech and drooling.  Chest pain EXAM: CT ANGIOGRAPHY HEAD AND NECK TECHNIQUE: Multidetector CT imaging of the head and neck was performed using the standard protocol during bolus administration of intravenous contrast. Multiplanar CT image reconstructions and MIPs were obtained to evaluate the vascular anatomy. Carotid stenosis measurements (when applicable) are obtained utilizing NASCET criteria, using the distal internal carotid diameter as the denominator. CONTRAST:  100 mL Isovue 370 IV COMPARISON:  CT head 08/18/2016 FINDINGS: CTA NECK FINDINGS Aortic arch: Minimal atherosclerotic calcification aortic arch. Negative for aneurysm or dissection. Proximal great vessels widely patent. Right carotid  system: Normal right carotid without significant atherosclerotic disease or stenosis. No dissection. Left carotid system: Mild atherosclerotic calcification in the carotid bulb without significant stenosis. Vertebral arteries: Left vertebral artery dominant. Mild stenosis at the origin. Mild atherosclerotic calcification distal left vertebral artery without significant stenosis Nondominant right vertebral artery. Mild to moderate stenosis at the origin. Right vertebral artery is patent through the remainder of its course and contributes to the basilar. Skeleton: Negative Other neck: Negative Upper chest: Chest CT from today reported separately Review of the MIP images confirms the above findings CTA HEAD FINDINGS Anterior circulation: Mild atherosclerotic calcification in the cavernous carotid bilaterally. No significant stenosis of the carotid artery through the cavernous segment. Anterior and middle cerebral arteries widely patent bilaterally without stenosis or occlusion. Posterior circulation: Both vertebral arteries patent to the basilar. PICA patent bilaterally. Right AICA patent. Basilar widely patent. Superior cerebellar and posterior cerebral arteries patent bilaterally. Venous sinuses: Widely patent. Anatomic variants: None Delayed phase: Normal enhancement on delayed imaging. Negative for mass lesion. Review of the MIP images confirms the above findings IMPRESSION: Carotid artery widely patent bilaterally without significant stenosis. Mild atherosclerotic disease at the left carotid bifurcation and in the cavernous segment bilaterally Mild stenosis at the origin the left vertebral artery and mild to moderate stenosis at the origin of the right vertebral artery. No significant intracranial stenosis. Electronically Signed   By: Marlan Palau M.D.   On: 08/18/2016 15:52   Mr Brain Wo Contrast  Result Date:  08/19/2016 CLINICAL DATA:  60 y/o  F; left facial droop and stumbling gait. EXAM: MRI HEAD WITHOUT  CONTRAST MRA HEAD WITHOUT CONTRAST TECHNIQUE: Multiplanar, multiecho pulse sequences of the brain and surrounding structures were obtained without intravenous contrast. Angiographic images of the head were obtained using MRA technique without contrast. COMPARISON:  08/18/2016 CT angiogram of the head and neck. 11/22/2014 MRI of the head. FINDINGS: MRI HEAD FINDINGS Brain: No acute infarction, hemorrhage, hydrocephalus, extra-axial collection or mass lesion. Few nonspecific foci of T2 FLAIR hyperintensity in subcortical and periventricular white matter within the pons are compatible with mild chronic microvascular ischemic changes. Mild parenchymal volume loss. Vascular: See below. Skull and upper cervical spine: Normal marrow signal. Sinuses/Orbits: Mild mucosal thickening of frontal and right greater than left anterior ethmoid sinuses. No abnormal signal of the mastoid air cells. Bilateral intra-ocular lens replacement. Other: None. MRA HEAD FINDINGS Internal carotid arteries: Patent. Anterior cerebral arteries: Patent. Middle cerebral arteries: Patent. Anterior communicating artery: Not identified, likely hypoplastic or absent. Posterior communicating arteries: Not identified, likely hypoplastic or absent. Posterior cerebral arteries: Patent. Basilar artery: Patent. Vertebral arteries: Patent. No evidence of high-grade stenosis, large vessel occlusion, or aneurysm unless noted above. IMPRESSION: 1. No evidence of acute infarct, hemorrhage, or focal mass effect. 2. Patent circle of Willis without evidence for high-grade stenosis, large vessel occlusion, or aneurysm. 3. Mild chronic microvascular ischemic changes and parenchymal volume loss are stable. 4. Mild paranasal sinus disease. Electronically Signed   By: Mitzi Hansen M.D.   On: 08/19/2016 02:49   Mr Maxine Glenn Head/brain ON Cm  Result Date: 08/19/2016 CLINICAL DATA:  60 y/o  F; left facial droop and stumbling gait. EXAM: MRI HEAD WITHOUT CONTRAST  MRA HEAD WITHOUT CONTRAST TECHNIQUE: Multiplanar, multiecho pulse sequences of the brain and surrounding structures were obtained without intravenous contrast. Angiographic images of the head were obtained using MRA technique without contrast. COMPARISON:  08/18/2016 CT angiogram of the head and neck. 11/22/2014 MRI of the head. FINDINGS: MRI HEAD FINDINGS Brain: No acute infarction, hemorrhage, hydrocephalus, extra-axial collection or mass lesion. Few nonspecific foci of T2 FLAIR hyperintensity in subcortical and periventricular white matter within the pons are compatible with mild chronic microvascular ischemic changes. Mild parenchymal volume loss. Vascular: See below. Skull and upper cervical spine: Normal marrow signal. Sinuses/Orbits: Mild mucosal thickening of frontal and right greater than left anterior ethmoid sinuses. No abnormal signal of the mastoid air cells. Bilateral intra-ocular lens replacement. Other: None. MRA HEAD FINDINGS Internal carotid arteries: Patent. Anterior cerebral arteries: Patent. Middle cerebral arteries: Patent. Anterior communicating artery: Not identified, likely hypoplastic or absent. Posterior communicating arteries: Not identified, likely hypoplastic or absent. Posterior cerebral arteries: Patent. Basilar artery: Patent. Vertebral arteries: Patent. No evidence of high-grade stenosis, large vessel occlusion, or aneurysm unless noted above. IMPRESSION: 1. No evidence of acute infarct, hemorrhage, or focal mass effect. 2. Patent circle of Willis without evidence for high-grade stenosis, large vessel occlusion, or aneurysm. 3. Mild chronic microvascular ischemic changes and parenchymal volume loss are stable. 4. Mild paranasal sinus disease. Electronically Signed   By: Mitzi Hansen M.D.   On: 08/19/2016 02:49   Ct Angio Chest Aorta W And/or Wo Contrast  Result Date: 08/18/2016 CLINICAL DATA:  60 year old female with intermittent left chest pain for 1 week. Pain  radiating to the left shoulder blade. Initial encounter. EXAM: CT ANGIOGRAPHY CHEST WITH CONTRAST TECHNIQUE: Multidetector CT imaging of the chest was performed using the standard protocol during bolus administration of intravenous contrast. Multiplanar CT  image reconstructions and MIPs were obtained to evaluate the vascular anatomy. CONTRAST:  100 mL Isovue 370 COMPARISON:  Chest CTA 11/19/2014 FINDINGS: Cardiovascular: Good contrast bolus timing in the aorta and also in the pulmonary arterial system. No pericardial effusion. Calcified aortic atherosclerosis. Calcified coronary artery atherosclerosis. No thoracic aortic dissection or aneurysm. Proximal great vessels are patent. No focal filling defect identified in the pulmonary arteries to suggest acute pulmonary embolism. Mediastinum/Nodes: No lymphadenopathy. Lungs/Pleura: Minimal retained secretions in the right mainstem bronchus. Mild bilateral lower lobe peribronchial thickening. Right greater than left lower lobe curvilinear opacity which most resembles subsegmental atelectasis. Mild depending ground-glass opacity also felt due to atelectasis. No other abnormal pulmonary opacity. No pleural effusion. Upper Abdomen: No proximal abdominal aortic dissection or aneurysm. Calcified atherosclerosis at the origins of the abdominal aortic branches. Negative visualized liver, gallbladder, spleen, pancreas, adrenal glands, kidneys, and bowel in the upper abdomen. Musculoskeletal: Chronic posterior lateral left seventh through ninth rib fractures. No acute osseous abnormality identified. Review of the MIP images confirms the above findings. IMPRESSION: 1. No thoracic aortic dissection or aneurysm. No evidence of acute pulmonary embolus. Calcified atherosclerosis of the aorta and coronary arteries. 2. Bilateral lower lobe peribronchial thickening which may be inflammatory. Scattered lower lobe subsegmental atelectasis. 3. Chronic posterior left rib fractures.  Electronically Signed   By: Odessa Fleming M.D.   On: 08/18/2016 15:49    Microbiology: No results found for this or any previous visit (from the past 240 hour(s)).   Labs: Basic Metabolic Panel:  Recent Labs Lab 08/18/16 1205 08/18/16 1242 08/19/16 0744  NA 142 142 140  K 5.1 5.1 3.9  CL 110 114* 113*  CO2 21*  --  22  GLUCOSE 99 91 73  BUN 18 23* 8  CREATININE 0.72 0.70 0.51  CALCIUM 9.8  --  8.7*   Liver Function Tests:  Recent Labs Lab 08/18/16 1205  AST 16  ALT 8*  ALKPHOS 53  BILITOT 0.3  PROT 7.0  ALBUMIN 4.0   No results for input(s): LIPASE, AMYLASE in the last 168 hours. No results for input(s): AMMONIA in the last 168 hours. CBC:  Recent Labs Lab 08/18/16 1205 08/18/16 1242 08/19/16 0744  WBC 7.1  --  3.5*  NEUTROABS 5.5  --   --   HGB 8.4* 13.3 9.9*  HCT 24.7* 39.0 31.9*  MCV 92.5  --  95.5  PLT 145*  --  188   Cardiac Enzymes:  Recent Labs Lab 08/18/16 1921 08/19/16 0116 08/19/16 0744  TROPONINI <0.03 <0.03 <0.03   BNP: BNP (last 3 results) No results for input(s): BNP in the last 8760 hours.  ProBNP (last 3 results) No results for input(s): PROBNP in the last 8760 hours.  CBG:  Recent Labs Lab 08/18/16 1610  GLUCAP 116*       SignedZannie Cove MD.  [facility] 08/23/2016, 4:53 PM

## 2016-08-25 ENCOUNTER — Ambulatory Visit: Payer: Self-pay | Admitting: Physician Assistant

## 2016-08-25 NOTE — Progress Notes (Deleted)
Assessment and Plan:   HPI 60 y.o.female presents for follow up from the hospital. Admit date to the hospital was 08/18/16, patient was discharged from the hospital on 08/20/16 and our office contacted the office the day after discharge to set up a follow up appointment, patient was admitted for: TIA and hypotension. She presented to the office with hypotension, left sided weakness and atypical chest pain. She was sent to the ER. She had negative CTA/MRI of her head, negative troponins/echo, started on  low-dose midodrine 5mg  BID for hypotension and suppose to wear compression stockings.     Images while in the hospital: Ct Angio Head W Or Wo Contrast  Result Date: 08/18/2016 CLINICAL DATA:  Slurred speech and drooling.  Chest pain EXAM: CT ANGIOGRAPHY HEAD AND NECK TECHNIQUE: Multidetector CT imaging of the head and neck was performed using the standard protocol during bolus administration of intravenous contrast. Multiplanar CT image reconstructions and MIPs were obtained to evaluate the vascular anatomy. Carotid stenosis measurements (when applicable) are obtained utilizing NASCET criteria, using the distal internal carotid diameter as the denominator. CONTRAST:  100 mL Isovue 370 IV COMPARISON:  CT head 08/18/2016 FINDINGS: CTA NECK FINDINGS Aortic arch: Minimal atherosclerotic calcification aortic arch. Negative for aneurysm or dissection. Proximal great vessels widely patent. Right carotid system: Normal right carotid without significant atherosclerotic disease or stenosis. No dissection. Left carotid system: Mild atherosclerotic calcification in the carotid bulb without significant stenosis. Vertebral arteries: Left vertebral artery dominant. Mild stenosis at the origin. Mild atherosclerotic calcification distal left vertebral artery without significant stenosis Nondominant right vertebral artery. Mild to moderate stenosis at the origin. Right vertebral artery is patent through the remainder of its  course and contributes to the basilar. Skeleton: Negative Other neck: Negative Upper chest: Chest CT from today reported separately Review of the MIP images confirms the above findings CTA HEAD FINDINGS Anterior circulation: Mild atherosclerotic calcification in the cavernous carotid bilaterally. No significant stenosis of the carotid artery through the cavernous segment. Anterior and middle cerebral arteries widely patent bilaterally without stenosis or occlusion. Posterior circulation: Both vertebral arteries patent to the basilar. PICA patent bilaterally. Right AICA patent. Basilar widely patent. Superior cerebellar and posterior cerebral arteries patent bilaterally. Venous sinuses: Widely patent. Anatomic variants: None Delayed phase: Normal enhancement on delayed imaging. Negative for mass lesion. Review of the MIP images confirms the above findings IMPRESSION: Carotid artery widely patent bilaterally without significant stenosis. Mild atherosclerotic disease at the left carotid bifurcation and in the cavernous segment bilaterally Mild stenosis at the origin the left vertebral artery and mild to moderate stenosis at the origin of the right vertebral artery. No significant intracranial stenosis. Electronically Signed   By: Franchot Gallo M.D.   On: 08/18/2016 15:52   Ct Head Wo Contrast  Result Date: 08/18/2016 CLINICAL DATA:  Blurry vision [place], slurred speech EXAM: CT HEAD WITHOUT CONTRAST TECHNIQUE: Contiguous axial images were obtained from the base of the skull through the vertex without intravenous contrast. COMPARISON:  11/11/2014 FINDINGS: Brain: No intracranial hemorrhage, mass effect or midline shift. No acute cortical infarction. No mass lesion is noted on this unenhanced scan. Vascular: Mild atherosclerotic calcifications of carotid siphon. Skull: No skull fracture is noted. Sinuses/Orbits: No acute finding. Other: None IMPRESSION: No acute intracranial abnormality. No significant change. No  definite acute cortical infarction. Electronically Signed   By: Lahoma Crocker M.D.   On: 08/18/2016 13:26   Ct Angio Neck W And/or Wo Contrast  Result Date: 08/18/2016 CLINICAL DATA:  Slurred speech and drooling.  Chest pain EXAM: CT ANGIOGRAPHY HEAD AND NECK TECHNIQUE: Multidetector CT imaging of the head and neck was performed using the standard protocol during bolus administration of intravenous contrast. Multiplanar CT image reconstructions and MIPs were obtained to evaluate the vascular anatomy. Carotid stenosis measurements (when applicable) are obtained utilizing NASCET criteria, using the distal internal carotid diameter as the denominator. CONTRAST:  100 mL Isovue 370 IV COMPARISON:  CT head 08/18/2016 FINDINGS: CTA NECK FINDINGS Aortic arch: Minimal atherosclerotic calcification aortic arch. Negative for aneurysm or dissection. Proximal great vessels widely patent. Right carotid system: Normal right carotid without significant atherosclerotic disease or stenosis. No dissection. Left carotid system: Mild atherosclerotic calcification in the carotid bulb without significant stenosis. Vertebral arteries: Left vertebral artery dominant. Mild stenosis at the origin. Mild atherosclerotic calcification distal left vertebral artery without significant stenosis Nondominant right vertebral artery. Mild to moderate stenosis at the origin. Right vertebral artery is patent through the remainder of its course and contributes to the basilar. Skeleton: Negative Other neck: Negative Upper chest: Chest CT from today reported separately Review of the MIP images confirms the above findings CTA HEAD FINDINGS Anterior circulation: Mild atherosclerotic calcification in the cavernous carotid bilaterally. No significant stenosis of the carotid artery through the cavernous segment. Anterior and middle cerebral arteries widely patent bilaterally without stenosis or occlusion. Posterior circulation: Both vertebral arteries patent to  the basilar. PICA patent bilaterally. Right AICA patent. Basilar widely patent. Superior cerebellar and posterior cerebral arteries patent bilaterally. Venous sinuses: Widely patent. Anatomic variants: None Delayed phase: Normal enhancement on delayed imaging. Negative for mass lesion. Review of the MIP images confirms the above findings IMPRESSION: Carotid artery widely patent bilaterally without significant stenosis. Mild atherosclerotic disease at the left carotid bifurcation and in the cavernous segment bilaterally Mild stenosis at the origin the left vertebral artery and mild to moderate stenosis at the origin of the right vertebral artery. No significant intracranial stenosis. Electronically Signed   By: Franchot Gallo M.D.   On: 08/18/2016 15:52   Mr Brain Wo Contrast  Result Date: 08/19/2016 CLINICAL DATA:  60 y/o  F; left facial droop and stumbling gait. EXAM: MRI HEAD WITHOUT CONTRAST MRA HEAD WITHOUT CONTRAST TECHNIQUE: Multiplanar, multiecho pulse sequences of the brain and surrounding structures were obtained without intravenous contrast. Angiographic images of the head were obtained using MRA technique without contrast. COMPARISON:  08/18/2016 CT angiogram of the head and neck. 11/22/2014 MRI of the head. FINDINGS: MRI HEAD FINDINGS Brain: No acute infarction, hemorrhage, hydrocephalus, extra-axial collection or mass lesion. Few nonspecific foci of T2 FLAIR hyperintensity in subcortical and periventricular white matter within the pons are compatible with mild chronic microvascular ischemic changes. Mild parenchymal volume loss. Vascular: See below. Skull and upper cervical spine: Normal marrow signal. Sinuses/Orbits: Mild mucosal thickening of frontal and right greater than left anterior ethmoid sinuses. No abnormal signal of the mastoid air cells. Bilateral intra-ocular lens replacement. Other: None. MRA HEAD FINDINGS Internal carotid arteries: Patent. Anterior cerebral arteries: Patent. Middle  cerebral arteries: Patent. Anterior communicating artery: Not identified, likely hypoplastic or absent. Posterior communicating arteries: Not identified, likely hypoplastic or absent. Posterior cerebral arteries: Patent. Basilar artery: Patent. Vertebral arteries: Patent. No evidence of high-grade stenosis, large vessel occlusion, or aneurysm unless noted above. IMPRESSION: 1. No evidence of acute infarct, hemorrhage, or focal mass effect. 2. Patent circle of Willis without evidence for high-grade stenosis, large vessel occlusion, or aneurysm. 3. Mild chronic microvascular ischemic changes and parenchymal volume loss  are stable. 4. Mild paranasal sinus disease. Electronically Signed   By: Kristine Garbe M.D.   On: 08/19/2016 02:49   Mr Jodene Nam Head/brain [id number] Cm  Result Date: 08/19/2016 CLINICAL DATA:  60 y/o  F; left facial droop and stumbling gait. EXAM: MRI HEAD WITHOUT CONTRAST MRA HEAD WITHOUT CONTRAST TECHNIQUE: Multiplanar, multiecho pulse sequences of the brain and surrounding structures were obtained without intravenous contrast. Angiographic images of the head were obtained using MRA technique without contrast. COMPARISON:  08/18/2016 CT angiogram of the head and neck. 11/22/2014 MRI of the head. FINDINGS: MRI HEAD FINDINGS Brain: No acute infarction, hemorrhage, hydrocephalus, extra-axial collection or mass lesion. Few nonspecific foci of T2 FLAIR hyperintensity in subcortical and periventricular white matter within the pons are compatible with mild chronic microvascular ischemic changes. Mild parenchymal volume loss. Vascular: See below. Skull and upper cervical spine: Normal marrow signal. Sinuses/Orbits: Mild mucosal thickening of frontal and right greater than left anterior ethmoid sinuses. No abnormal signal of the mastoid air cells. Bilateral intra-ocular lens replacement. Other: None. MRA HEAD FINDINGS Internal carotid arteries: Patent. Anterior cerebral arteries: Patent. Middle cerebral  arteries: Patent. Anterior communicating artery: Not identified, likely hypoplastic or absent. Posterior communicating arteries: Not identified, likely hypoplastic or absent. Posterior cerebral arteries: Patent. Basilar artery: Patent. Vertebral arteries: Patent. No evidence of high-grade stenosis, large vessel occlusion, or aneurysm unless noted above. IMPRESSION: 1. No evidence of acute infarct, hemorrhage, or focal mass effect. 2. Patent circle of Willis without evidence for high-grade stenosis, large vessel occlusion, or aneurysm. 3. Mild chronic microvascular ischemic changes and parenchymal volume loss are stable. 4. Mild paranasal sinus disease. Electronically Signed   By: Kristine Garbe M.D.   On: 08/19/2016 02:49   Ct Angio Chest Aorta W And/or Wo Contrast  Result Date: 08/18/2016 CLINICAL DATA:  60 year old female with intermittent left chest pain for 1 week. Pain radiating to the left shoulder blade. Initial encounter. EXAM: CT ANGIOGRAPHY CHEST WITH CONTRAST TECHNIQUE: Multidetector CT imaging of the chest was performed using the standard protocol during bolus administration of intravenous contrast. Multiplanar CT image reconstructions and MIPs were obtained to evaluate the vascular anatomy. CONTRAST:  100 mL Isovue 370 COMPARISON:  Chest CTA 11/19/2014 FINDINGS: Cardiovascular: Good contrast bolus timing in the aorta and also in the pulmonary arterial system. No pericardial effusion. Calcified aortic atherosclerosis. Calcified coronary artery atherosclerosis. No thoracic aortic dissection or aneurysm. Proximal great vessels are patent. No focal filling defect identified in the pulmonary arteries to suggest acute pulmonary embolism. Mediastinum/Nodes: No lymphadenopathy. Lungs/Pleura: Minimal retained secretions in the right mainstem bronchus. Mild bilateral lower lobe peribronchial thickening. Right greater than left lower lobe curvilinear opacity which most resembles subsegmental  atelectasis. Mild depending ground-glass opacity also felt due to atelectasis. No other abnormal pulmonary opacity. No pleural effusion. Upper Abdomen: No proximal abdominal aortic dissection or aneurysm. Calcified atherosclerosis at the origins of the abdominal aortic branches. Negative visualized liver, gallbladder, spleen, pancreas, adrenal glands, kidneys, and bowel in the upper abdomen. Musculoskeletal: Chronic posterior lateral left seventh through ninth rib fractures. No acute osseous abnormality identified. Review of the MIP images confirms the above findings. IMPRESSION: 1. No thoracic aortic dissection or aneurysm. No evidence of acute pulmonary embolus. Calcified atherosclerosis of the aorta and coronary arteries. 2. Bilateral lower lobe peribronchial thickening which may be inflammatory. Scattered lower lobe subsegmental atelectasis. 3. Chronic posterior left rib fractures. Electronically Signed   By: Genevie Ann M.D.   On: 08/18/2016 15:49    Past Medical History:  Diagnosis Date  . Allergy   . Anemia   . Arthritis   . Asthma   . Blood transfusion without reported diagnosis   . Cataract   . COPD (chronic obstructive pulmonary disease) ([place])   . Duodenal ulcer   . Emphysema of lung ([place])   . Family history of malignant neoplasm of gastrointestinal tract   . GERD (gastroesophageal reflux disease)   . Hepatitis    had in 6th grade pt unsure which kind  . Hiatal hernia   . HTN (hypertension) 10/03/2014  . Hx of colonic polyps   . Hyperlipemia   . Internal hemorrhoids   . Osteoporosis   . Peripheral vascular disease ([place])   . Pneumonia    hx of  . Shortness of breath dyspnea    with ambulation  . Vitamin D deficiency      Allergies  Allergen Reactions  . Celebrex [Celecoxib]     GI bleed  . Crestor [Rosuvastatin]     Made legs hurt  . Penicillins     REACTION: whelps  . Pravastatin     Muscle aches      Current Outpatient Prescriptions on File Prior to Visit   Medication Sig Dispense Refill  . acetaminophen (TYLENOL) 500 MG tablet Take 500 mg by mouth every 6 (six) hours as needed for moderate pain or headache.     . albuterol (PROVENTIL HFA;VENTOLIN HFA) 108 (90 BASE) MCG/ACT inhaler Inhale 2 puffs into the lungs every 6 (six) hours as needed for wheezing or shortness of breath. 1 Inhaler 2  . aspirin EC 81 MG tablet Take 1 tablet (81 mg total) by mouth daily. 30 tablet 0  . cetirizine (ZYRTEC) 10 MG tablet Take 10 mg by mouth as needed for allergies.    . Cholecalciferol (VITAMIN D) 2000 UNITS CAPS Take 7000 units a day     . ezetimibe (ZETIA) 10 MG tablet Take 1 tablet (10 mg total) by mouth daily. 30 tablet 5  . FeFum-FePoly-FA-B Cmp-C-Biot (INTEGRA PLUS) CAPS Take 1 capsule by mouth daily. (Patient not taking: Reported on 08/18/2016) 30 capsule 4  . Fluticasone-Salmeterol (ADVAIR DISKUS) 250-50 MCG/DOSE AEPB Inhale 1 puff into the lungs every 12 (twelve) hours.    . midodrine (PROAMATINE) 5 MG tablet Take 1 tablet (5 mg total) by mouth 2 (two) times daily with a meal. 60 tablet 0  . omeprazole (PRILOSEC) 40 MG capsule TAKE 1 CAPSULE (40 MG TOTAL) BY MOUTH 2 (TWO) TIMES DAILY. 180 capsule 1   No current facility-administered medications on file prior to visit.     ROS: all negative except above.   Physical Exam: There were no vitals filed for this visit. There were no vitals taken for this visit. General Appearance: Well nourished, in no apparent distress. Eyes: PERRLA, EOMs, conjunctiva no swelling or erythema Sinuses: No Frontal/maxillary tenderness ENT/Mouth: Ext aud canals clear, TMs without erythema, bulging. No erythema, swelling, or exudate on post pharynx.  Tonsils not swollen or erythematous. Hearing normal.  Neck: Supple, thyroid normal.  Respiratory: Respiratory effort normal, BS equal bilaterally without rales, rhonchi, wheezing or stridor.  Cardio: RRR with no MRGs. Brisk peripheral pulses without edema.  Abdomen: Soft, + BS.   Non tender, no guarding, rebound, hernias, masses. Lymphatics: Non tender without lymphadenopathy.  Musculoskeletal: Full ROM, 5/5 strength, normal gait.  Skin: Warm, dry without rashes, lesions, ecchymosis.  Neuro: Cranial nerves intact. Normal muscle tone, no cerebellar symptoms. Sensation intact.  Psych: Awake and oriented X 3,  normal affect, Insight and Judgment appropriate.     Vicie Mutters, PA-C 8:44 AM [facility]

## 2016-09-01 ENCOUNTER — Encounter: Payer: Self-pay | Admitting: Physician Assistant

## 2016-09-01 ENCOUNTER — Other Ambulatory Visit: Payer: Self-pay

## 2016-09-01 ENCOUNTER — Ambulatory Visit (INDEPENDENT_AMBULATORY_CARE_PROVIDER_SITE_OTHER): Payer: BLUE CROSS/BLUE SHIELD | Admitting: Physician Assistant

## 2016-09-01 VITALS — BP 100/80 | HR 90 | Temp 97.3°F | Resp 14 | Ht 61.0 in | Wt 98.6 lb

## 2016-09-01 DIAGNOSIS — I959 Hypotension, unspecified: Secondary | ICD-10-CM | POA: Diagnosis not present

## 2016-09-01 DIAGNOSIS — D500 Iron deficiency anemia secondary to blood loss (chronic): Secondary | ICD-10-CM | POA: Diagnosis not present

## 2016-09-01 DIAGNOSIS — E441 Mild protein-calorie malnutrition: Secondary | ICD-10-CM

## 2016-09-01 DIAGNOSIS — D649 Anemia, unspecified: Secondary | ICD-10-CM

## 2016-09-01 DIAGNOSIS — G459 Transient cerebral ischemic attack, unspecified: Secondary | ICD-10-CM

## 2016-09-01 DIAGNOSIS — D508 Other iron deficiency anemias: Secondary | ICD-10-CM

## 2016-09-01 MED ORDER — MIDODRINE HCL 5 MG PO TABS: 5.0000 mg | ORAL_TABLET | Freq: Three times a day (TID) | ORAL | 0 refills | Status: DC

## 2016-09-01 NOTE — Patient Instructions (Signed)
Please take the midodrin 10 mg (2 pills in the morning) and 1 pill in the evening with food If this does not help that lunch time low blood pressure reading then please divide the dose to 5mg  AM, 5mg  lunch, 5mg  PM  Get compression stockings  Make sure you are drinking  AT LEAST 64 oz of water a day, try to get more like 80 oz Can be water OR gatorade Increase salt  HOME CARE INSTRUCTIONS   Do not stand or sit in one position for long periods of time. Do not sit with your legs crossed. Rest with your legs raised during the day.  Your legs have to be higher than your heart so that gravity will force the valves to open, so please really elevate your legs.   Wear elastic stockings or support hose. Do not wear other tight, encircling garments around the legs, pelvis, or waist.  [employer]  has a wide variety of well priced compression stockings. [address], [place] [address] [phone]  Walk as much as possible to increase blood flow.  Raise the foot of your bed at night with 2-inch blocks. SEEK MEDICAL CARE IF:   The skin around your ankle starts to break down.  You have pain, redness, tenderness, or hard swelling developing in your leg over a vein.  You are uncomfortable due to leg pain. Document Released: 07/14/2005 Document Revised: 12/27/2011 Document Reviewed: 11/30/2010 [facility] Patient Information 2014 [place].

## 2016-09-01 NOTE — Progress Notes (Signed)
Assessment and Plan: Hypotension- increase midodrine to 10 AM and 5 PM, if this does not help than divide into 3 doses AM, noon, PM.  TIA- continue ASA, will keep BP above 100 Iron def- getting CT tomorrow and following with cancer center, will have labs there.  Malnutrition- continue ensure 2 x a day  Future Appointments Date Time Provider [place]  09/02/2016 11:45 AM [facility] ONLY [facility] None  09/02/2016 12:30 PM [facility]-CT 2 [facility]CT [place]  09/03/2016 9:00 AM Nicholas Lose, MD [facility] None  03/08/2017 12:00 PM [facility] HS VASC 4 [facility]  03/08/2017 1:15 PM Sharmon Leyden Nickel, NP [facility]  06/28/2017 9:00 AM Vicie Mutters, PA-C [facility] None    HPI 60 y.o.female presents for follow up from the hospital. Admit date to the hospital was 08/18/16, patient was discharged from the hospital on 08/20/16 and our office contacted the office the day after discharge to set up a follow up appointment, patient was admitted for: Hypotension, iron def, left-sided weakness and TIA. She was admitted for hypotension and possible CVA. She had a normal MRI/MRA, echo, on ASA, and started on midodrine 5mg  BID with compression stockings for hypotension. She also has atypical chest pain but had neg troponin's and negative EKG. She is following with Dr. Henrene Pastor and Dr. Sonny Dandy for iron def.   Patient is here today. Her BP is BP: 100/80, has been better but she still has days that it will go down to 80's and she will feel weak, happens around noon daily, takes AM at 730 and PM around 7pm with dinner.  She is getting AB Ct with contrast tomorrow. No CP, SOB, no weakness, slurred speech, blurry vision. She is not wearing compression stockings, she is drinking 8-9 bottles of 12 oz. She has increased her ensure to 2 a day.  Wt Readings from Last 3 Encounters:  09/01/16 98 lb 9.6 oz (44.7 kg)  08/19/16 106 lb 11.2 oz (48.4 kg)  08/18/16 99 lb (44.9 kg)    Images while in the hospital: Ct Angio Head W Or  Wo Contrast  Result Date: 08/18/2016 CLINICAL DATA:  Slurred speech and drooling.  Chest pain EXAM: CT ANGIOGRAPHY HEAD AND NECK TECHNIQUE: Multidetector CT imaging of the head and neck was performed using the standard protocol during bolus administration of intravenous contrast. Multiplanar CT image reconstructions and MIPs were obtained to evaluate the vascular anatomy. Carotid stenosis measurements (when applicable) are obtained utilizing NASCET criteria, using the distal internal carotid diameter as the denominator. CONTRAST:  100 mL Isovue 370 IV COMPARISON:  CT head 08/18/2016 FINDINGS: CTA NECK FINDINGS Aortic arch: Minimal atherosclerotic calcification aortic arch. Negative for aneurysm or dissection. Proximal great vessels widely patent. Right carotid system: Normal right carotid without significant atherosclerotic disease or stenosis. No dissection. Left carotid system: Mild atherosclerotic calcification in the carotid bulb without significant stenosis. Vertebral arteries: Left vertebral artery dominant. Mild stenosis at the origin. Mild atherosclerotic calcification distal left vertebral artery without significant stenosis Nondominant right vertebral artery. Mild to moderate stenosis at the origin. Right vertebral artery is patent through the remainder of its course and contributes to the basilar. Skeleton: Negative Other neck: Negative Upper chest: Chest CT from today reported separately Review of the MIP images confirms the above findings CTA HEAD FINDINGS Anterior circulation: Mild atherosclerotic calcification in the cavernous carotid bilaterally. No significant stenosis of the carotid artery through the cavernous segment. Anterior and middle cerebral arteries widely patent bilaterally without stenosis or occlusion. Posterior circulation:  Both vertebral arteries patent to the basilar. PICA patent bilaterally. Right AICA patent. Basilar widely patent. Superior cerebellar and posterior cerebral  arteries patent bilaterally. Venous sinuses: Widely patent. Anatomic variants: None Delayed phase: Normal enhancement on delayed imaging. Negative for mass lesion. Review of the MIP images confirms the above findings IMPRESSION: Carotid artery widely patent bilaterally without significant stenosis. Mild atherosclerotic disease at the left carotid bifurcation and in the cavernous segment bilaterally Mild stenosis at the origin the left vertebral artery and mild to moderate stenosis at the origin of the right vertebral artery. No significant intracranial stenosis. Electronically Signed   By: Franchot Gallo M.D.   On: 08/18/2016 15:52   Ct Head Wo Contrast  Result Date: 08/18/2016 CLINICAL DATA:  Blurry vision [place], slurred speech EXAM: CT HEAD WITHOUT CONTRAST TECHNIQUE: Contiguous axial images were obtained from the base of the skull through the vertex without intravenous contrast. COMPARISON:  11/11/2014 FINDINGS: Brain: No intracranial hemorrhage, mass effect or midline shift. No acute cortical infarction. No mass lesion is noted on this unenhanced scan. Vascular: Mild atherosclerotic calcifications of carotid siphon. Skull: No skull fracture is noted. Sinuses/Orbits: No acute finding. Other: None IMPRESSION: No acute intracranial abnormality. No significant change. No definite acute cortical infarction. Electronically Signed   By: Lahoma Crocker M.D.   On: 08/18/2016 13:26   Ct Angio Neck W And/or Wo Contrast  Result Date: 08/18/2016 CLINICAL DATA:  Slurred speech and drooling.  Chest pain EXAM: CT ANGIOGRAPHY HEAD AND NECK TECHNIQUE: Multidetector CT imaging of the head and neck was performed using the standard protocol during bolus administration of intravenous contrast. Multiplanar CT image reconstructions and MIPs were obtained to evaluate the vascular anatomy. Carotid stenosis measurements (when applicable) are obtained utilizing NASCET criteria, using the distal internal carotid diameter as the  denominator. CONTRAST:  100 mL Isovue 370 IV COMPARISON:  CT head 08/18/2016 FINDINGS: CTA NECK FINDINGS Aortic arch: Minimal atherosclerotic calcification aortic arch. Negative for aneurysm or dissection. Proximal great vessels widely patent. Right carotid system: Normal right carotid without significant atherosclerotic disease or stenosis. No dissection. Left carotid system: Mild atherosclerotic calcification in the carotid bulb without significant stenosis. Vertebral arteries: Left vertebral artery dominant. Mild stenosis at the origin. Mild atherosclerotic calcification distal left vertebral artery without significant stenosis Nondominant right vertebral artery. Mild to moderate stenosis at the origin. Right vertebral artery is patent through the remainder of its course and contributes to the basilar. Skeleton: Negative Other neck: Negative Upper chest: Chest CT from today reported separately Review of the MIP images confirms the above findings CTA HEAD FINDINGS Anterior circulation: Mild atherosclerotic calcification in the cavernous carotid bilaterally. No significant stenosis of the carotid artery through the cavernous segment. Anterior and middle cerebral arteries widely patent bilaterally without stenosis or occlusion. Posterior circulation: Both vertebral arteries patent to the basilar. PICA patent bilaterally. Right AICA patent. Basilar widely patent. Superior cerebellar and posterior cerebral arteries patent bilaterally. Venous sinuses: Widely patent. Anatomic variants: None Delayed phase: Normal enhancement on delayed imaging. Negative for mass lesion. Review of the MIP images confirms the above findings IMPRESSION: Carotid artery widely patent bilaterally without significant stenosis. Mild atherosclerotic disease at the left carotid bifurcation and in the cavernous segment bilaterally Mild stenosis at the origin the left vertebral artery and mild to moderate stenosis at the origin of the right  vertebral artery. No significant intracranial stenosis. Electronically Signed   By: Franchot Gallo M.D.   On: 08/18/2016 15:52   Mr Brain Wo Contrast  Result Date: 08/19/2016 CLINICAL DATA:  60 y/o  F; left facial droop and stumbling gait. EXAM: MRI HEAD WITHOUT CONTRAST MRA HEAD WITHOUT CONTRAST TECHNIQUE: Multiplanar, multiecho pulse sequences of the brain and surrounding structures were obtained without intravenous contrast. Angiographic images of the head were obtained using MRA technique without contrast. COMPARISON:  08/18/2016 CT angiogram of the head and neck. 11/22/2014 MRI of the head. FINDINGS: MRI HEAD FINDINGS Brain: No acute infarction, hemorrhage, hydrocephalus, extra-axial collection or mass lesion. Few nonspecific foci of T2 FLAIR hyperintensity in subcortical and periventricular white matter within the pons are compatible with mild chronic microvascular ischemic changes. Mild parenchymal volume loss. Vascular: See below. Skull and upper cervical spine: Normal marrow signal. Sinuses/Orbits: Mild mucosal thickening of frontal and right greater than left anterior ethmoid sinuses. No abnormal signal of the mastoid air cells. Bilateral intra-ocular lens replacement. Other: None. MRA HEAD FINDINGS Internal carotid arteries: Patent. Anterior cerebral arteries: Patent. Middle cerebral arteries: Patent. Anterior communicating artery: Not identified, likely hypoplastic or absent. Posterior communicating arteries: Not identified, likely hypoplastic or absent. Posterior cerebral arteries: Patent. Basilar artery: Patent. Vertebral arteries: Patent. No evidence of high-grade stenosis, large vessel occlusion, or aneurysm unless noted above. IMPRESSION: 1. No evidence of acute infarct, hemorrhage, or focal mass effect. 2. Patent circle of Willis without evidence for high-grade stenosis, large vessel occlusion, or aneurysm. 3. Mild chronic microvascular ischemic changes and parenchymal volume loss are stable.  4. Mild paranasal sinus disease. Electronically Signed   By: Kristine Garbe M.D.   On: 08/19/2016 02:49   Mr Jodene Nam Head/brain [id number] Cm  Result Date: 08/19/2016 CLINICAL DATA:  60 y/o  F; left facial droop and stumbling gait. EXAM: MRI HEAD WITHOUT CONTRAST MRA HEAD WITHOUT CONTRAST TECHNIQUE: Multiplanar, multiecho pulse sequences of the brain and surrounding structures were obtained without intravenous contrast. Angiographic images of the head were obtained using MRA technique without contrast. COMPARISON:  08/18/2016 CT angiogram of the head and neck. 11/22/2014 MRI of the head. FINDINGS: MRI HEAD FINDINGS Brain: No acute infarction, hemorrhage, hydrocephalus, extra-axial collection or mass lesion. Few nonspecific foci of T2 FLAIR hyperintensity in subcortical and periventricular white matter within the pons are compatible with mild chronic microvascular ischemic changes. Mild parenchymal volume loss. Vascular: See below. Skull and upper cervical spine: Normal marrow signal. Sinuses/Orbits: Mild mucosal thickening of frontal and right greater than left anterior ethmoid sinuses. No abnormal signal of the mastoid air cells. Bilateral intra-ocular lens replacement. Other: None. MRA HEAD FINDINGS Internal carotid arteries: Patent. Anterior cerebral arteries: Patent. Middle cerebral arteries: Patent. Anterior communicating artery: Not identified, likely hypoplastic or absent. Posterior communicating arteries: Not identified, likely hypoplastic or absent. Posterior cerebral arteries: Patent. Basilar artery: Patent. Vertebral arteries: Patent. No evidence of high-grade stenosis, large vessel occlusion, or aneurysm unless noted above. IMPRESSION: 1. No evidence of acute infarct, hemorrhage, or focal mass effect. 2. Patent circle of Willis without evidence for high-grade stenosis, large vessel occlusion, or aneurysm. 3. Mild chronic microvascular ischemic changes and parenchymal volume loss are stable. 4. Mild  paranasal sinus disease. Electronically Signed   By: Kristine Garbe M.D.   On: 08/19/2016 02:49   Ct Angio Chest Aorta W And/or Wo Contrast  Result Date: 08/18/2016 CLINICAL DATA:  60 year old female with intermittent left chest pain for 1 week. Pain radiating to the left shoulder blade. Initial encounter. EXAM: CT ANGIOGRAPHY CHEST WITH CONTRAST TECHNIQUE: Multidetector CT imaging of the chest was performed using the standard protocol during bolus administration of intravenous contrast. Multiplanar  CT image reconstructions and MIPs were obtained to evaluate the vascular anatomy. CONTRAST:  100 mL Isovue 370 COMPARISON:  Chest CTA 11/19/2014 FINDINGS: Cardiovascular: Good contrast bolus timing in the aorta and also in the pulmonary arterial system. No pericardial effusion. Calcified aortic atherosclerosis. Calcified coronary artery atherosclerosis. No thoracic aortic dissection or aneurysm. Proximal great vessels are patent. No focal filling defect identified in the pulmonary arteries to suggest acute pulmonary embolism. Mediastinum/Nodes: No lymphadenopathy. Lungs/Pleura: Minimal retained secretions in the right mainstem bronchus. Mild bilateral lower lobe peribronchial thickening. Right greater than left lower lobe curvilinear opacity which most resembles subsegmental atelectasis. Mild depending ground-glass opacity also felt due to atelectasis. No other abnormal pulmonary opacity. No pleural effusion. Upper Abdomen: No proximal abdominal aortic dissection or aneurysm. Calcified atherosclerosis at the origins of the abdominal aortic branches. Negative visualized liver, gallbladder, spleen, pancreas, adrenal glands, kidneys, and bowel in the upper abdomen. Musculoskeletal: Chronic posterior lateral left seventh through ninth rib fractures. No acute osseous abnormality identified. Review of the MIP images confirms the above findings. IMPRESSION: 1. No thoracic aortic dissection or aneurysm. No evidence  of acute pulmonary embolus. Calcified atherosclerosis of the aorta and coronary arteries. 2. Bilateral lower lobe peribronchial thickening which may be inflammatory. Scattered lower lobe subsegmental atelectasis. 3. Chronic posterior left rib fractures. Electronically Signed   By: Genevie Ann M.D.   On: 08/18/2016 15:49    Past Medical History:  Diagnosis Date  . Allergy   . Anemia   . Arthritis   . Asthma   . Blood transfusion without reported diagnosis   . Cataract   . COPD (chronic obstructive pulmonary disease) ([place])   . Duodenal ulcer   . Emphysema of lung ([place])   . Family history of malignant neoplasm of gastrointestinal tract   . GERD (gastroesophageal reflux disease)   . Hepatitis    had in 6th grade pt unsure which kind  . Hiatal hernia   . HTN (hypertension) 10/03/2014  . Hx of colonic polyps   . Hyperlipemia   . Internal hemorrhoids   . Osteoporosis   . Peripheral vascular disease ([place])   . Pneumonia    hx of  . Shortness of breath dyspnea    with ambulation  . Vitamin D deficiency      Allergies  Allergen Reactions  . Celebrex [Celecoxib]     GI bleed  . Crestor [Rosuvastatin]     Made legs hurt  . Penicillins     REACTION: whelps  . Pravastatin     Muscle aches      Current Outpatient Prescriptions on File Prior to Visit  Medication Sig Dispense Refill  . acetaminophen (TYLENOL) 500 MG tablet Take 500 mg by mouth every 6 (six) hours as needed for moderate pain or headache.     . albuterol (PROVENTIL HFA;VENTOLIN HFA) 108 (90 BASE) MCG/ACT inhaler Inhale 2 puffs into the lungs every 6 (six) hours as needed for wheezing or shortness of breath. 1 Inhaler 2  . aspirin EC 81 MG tablet Take 1 tablet (81 mg total) by mouth daily. 30 tablet 0  . cetirizine (ZYRTEC) 10 MG tablet Take 10 mg by mouth as needed for allergies.    . Cholecalciferol (VITAMIN D) 2000 UNITS CAPS Take 7000 units a day     . ezetimibe (ZETIA) 10 MG tablet Take 1 tablet (10 mg total) by mouth  daily. 30 tablet 5  . FeFum-FePoly-FA-B Cmp-C-Biot (INTEGRA PLUS) CAPS Take 1 capsule by mouth daily. (Patient  not taking: Reported on 08/18/2016) 30 capsule 4  . Fluticasone-Salmeterol (ADVAIR DISKUS) 250-50 MCG/DOSE AEPB Inhale 1 puff into the lungs every 12 (twelve) hours.    . midodrine (PROAMATINE) 5 MG tablet Take 1 tablet (5 mg total) by mouth 2 (two) times daily with a meal. 60 tablet 0  . omeprazole (PRILOSEC) 40 MG capsule TAKE 1 CAPSULE (40 MG TOTAL) BY MOUTH 2 (TWO) TIMES DAILY. 180 capsule 1   No current facility-administered medications on file prior to visit.     ROS: all negative except above.   Physical Exam: There were no vitals filed for this visit. There were no vitals taken for this visit. General Appearance: Skinny, in no apparent distress. Eyes: PERRLA, EOMs, conjunctiva no swelling or erythema Sinuses: No Frontal/maxillary tenderness ENT/Mouth: Ext aud canals clear, TMs without erythema, bulging. No erythema, swelling, or exudate on post pharynx.  Tonsils not swollen or erythematous. Hearing normal.  Neck: Supple, thyroid normal.  Respiratory: Respiratory effort normal, BS equal bilaterally without rales, rhonchi, wheezing or stridor.  Cardio: RRR with no MRGs. Brisk peripheral pulses without edema.  Abdomen: Soft, + BS.  Non tender, no guarding, rebound, hernias, masses. Lymphatics: Non tender without lymphadenopathy.  Musculoskeletal: Full ROM, 5/5 strength, normal gait.  Skin: Warm, dry without rashes, lesions, ecchymosis.  Neuro: Cranial nerves intact. Normal muscle tone, no cerebellar symptoms. Sensation intact.  Psych: Awake and oriented X 3, normal affect, Insight and Judgment appropriate.     Vicie Mutters, PA-C 9:33 AM [facility]

## 2016-09-02 ENCOUNTER — Encounter (HOSPITAL_COMMUNITY): Payer: Self-pay

## 2016-09-02 ENCOUNTER — Ambulatory Visit (HOSPITAL_COMMUNITY)
Admission: RE | Admit: 2016-09-02 | Discharge: 2016-09-02 | Disposition: A | Discharge status: Home / Self Care | Payer: BLUE CROSS/BLUE SHIELD | Source: Ambulatory Visit | Attending: Hematology and Oncology | Admitting: Hematology and Oncology

## 2016-09-02 ENCOUNTER — Other Ambulatory Visit (HOSPITAL_BASED_OUTPATIENT_CLINIC_OR_DEPARTMENT_OTHER): Payer: BLUE CROSS/BLUE SHIELD

## 2016-09-02 DIAGNOSIS — R634 Abnormal weight loss: Secondary | ICD-10-CM | POA: Insufficient documentation

## 2016-09-02 DIAGNOSIS — D508 Other iron deficiency anemias: Secondary | ICD-10-CM

## 2016-09-02 DIAGNOSIS — R918 Other nonspecific abnormal finding of lung field: Secondary | ICD-10-CM | POA: Diagnosis not present

## 2016-09-02 DIAGNOSIS — N2889 Other specified disorders of kidney and ureter: Secondary | ICD-10-CM | POA: Diagnosis not present

## 2016-09-02 DIAGNOSIS — D649 Anemia, unspecified: Secondary | ICD-10-CM

## 2016-09-02 DIAGNOSIS — I700 Atherosclerosis of aorta: Secondary | ICD-10-CM | POA: Insufficient documentation

## 2016-09-02 DIAGNOSIS — N289 Disorder of kidney and ureter, unspecified: Secondary | ICD-10-CM | POA: Diagnosis not present

## 2016-09-02 LAB — COMPREHENSIVE METABOLIC PANEL
ALT: 9 U/L (ref 0–55)
ANION GAP: 14 meq/L — AB (ref 3–11)
AST: 16 U/L (ref 5–34)
Albumin: 3.8 g/dL (ref 3.5–5.0)
Alkaline Phosphatase: 77 U/L (ref 40–150)
BUN: 13.1 mg/dL (ref 7.0–26.0)
CALCIUM: 9.7 mg/dL (ref 8.4–10.4)
CHLORIDE: 106 meq/L (ref 98–109)
CO2: 20 mEq/L — ABNORMAL LOW (ref 22–29)
CREATININE: 0.8 mg/dL (ref 0.6–1.1)
EGFR: 82 mL/min/{1.73_m2} — ABNORMAL LOW (ref >90)
Glucose: 86 mg/dl (ref 70–140)
Potassium: 4.5 mEq/L (ref 3.5–5.1)
Sodium: 141 mEq/L (ref 136–145)
TOTAL PROTEIN: 7.7 g/dL (ref 6.4–8.3)

## 2016-09-02 LAB — CBC WITH DIFFERENTIAL/PLATELET
BASO%: 1.3 % (ref 0.0–2.0)
Basophils Absolute: 0.1 10*3/uL (ref 0.0–0.1)
EOS ABS: 0.2 10*3/uL (ref 0.0–0.5)
EOS%: 4.9 % (ref 0.0–7.0)
HEMATOCRIT: 39.5 % (ref 34.8–46.6)
HGB: 12.6 g/dL (ref 11.6–15.9)
LYMPH#: 1.9 10*3/uL (ref 0.9–3.3)
LYMPH%: 52.3 % — ABNORMAL HIGH (ref 14.0–49.7)
MCH: 30.4 pg (ref 25.1–34.0)
MCHC: 31.9 g/dL (ref 31.5–36.0)
MCV: 95.2 fL (ref 79.5–101.0)
MONO#: 0.3 10*3/uL (ref 0.1–0.9)
MONO%: 7.3 % (ref 0.0–14.0)
NEUT#: 1.3 10*3/uL — ABNORMAL LOW (ref 1.5–6.5)
NEUT%: 34.2 % — AB (ref 38.4–76.8)
PLATELETS: 243 10*3/uL (ref 145–400)
RBC: 4.15 10*6/uL (ref 3.70–5.45)
WBC: 3.7 10*3/uL — ABNORMAL LOW (ref 3.9–10.3)

## 2016-09-02 MED ORDER — IOPAMIDOL (ISOVUE-300) INJECTION 61%
100.0000 mL | Freq: Once | INTRAVENOUS | Status: AC | PRN
  Administered 2016-09-02: 75 mL via INTRAVENOUS

## 2016-09-02 MED ORDER — IOPAMIDOL (ISOVUE-300) INJECTION 61%
INTRAVENOUS | Status: AC
  Filled 2016-09-02: qty 75

## 2016-09-02 NOTE — Assessment & Plan Note (Signed)
Profound iron deficiency anemia since at least January 2016. Extensive workup with upper endoscopy, colonoscopy did not reveal any abnormalities. Capsule endoscopy was performed and patient does not know the results of that test. She does not have any other clinical evidence of bleeding.  Most recent blood work on 07/08/2016 revealed hemoglobin of 7.8 with an MCV of 80, RDW 18.5, normal white cells and platelets. Normal B-12 547 Ferritin of 6, 7 months ago it was 6 and 9 months ago it was 12 Iron saturation of 1% with a TIBC of 558  Profound iron deficiency anemia since at least January 2016. Extensive workup with upper endoscopy, colonoscopy did not reveal any abnormalities. Capsule endoscopy was performed and patient does not know the results of that test. She does not have any other clinical evidence of bleeding.  Most recent blood work on 07/08/2016 revealed hemoglobin of 7.8 with an MCV of 80, RDW 18.5, normal white cells and platelets. Normal B-12 547 Ferritin of 6, 7 months ago it was 6 and 9 months ago it was 12 Iron saturation of 1% with a TIBC of 558

## 2016-09-03 ENCOUNTER — Ambulatory Visit (HOSPITAL_BASED_OUTPATIENT_CLINIC_OR_DEPARTMENT_OTHER): Payer: BLUE CROSS/BLUE SHIELD | Admitting: Hematology and Oncology

## 2016-09-03 ENCOUNTER — Encounter: Payer: Self-pay | Admitting: Hematology and Oncology

## 2016-09-03 DIAGNOSIS — D509 Iron deficiency anemia, unspecified: Secondary | ICD-10-CM

## 2016-09-03 DIAGNOSIS — D508 Other iron deficiency anemias: Secondary | ICD-10-CM

## 2016-09-03 DIAGNOSIS — Z87891 Personal history of nicotine dependence: Secondary | ICD-10-CM

## 2016-09-03 DIAGNOSIS — R911 Solitary pulmonary nodule: Secondary | ICD-10-CM | POA: Diagnosis not present

## 2016-09-03 MED ORDER — LEVOFLOXACIN 500 MG PO TABS: 500.0000 mg | ORAL_TABLET | Freq: Every day | ORAL | 0 refills | Status: DC

## 2016-09-03 NOTE — Progress Notes (Signed)
Patient Care Team: Unk Pinto, MD as PCP - General (Internal Medicine) Irene Shipper, MD as Consulting Physician (Gastroenterology) Gus Height, MD as Consulting Physician (Obstetrics and Gynecology)  DIAGNOSIS:  Encounter Diagnosis  Name Primary?  . Iron deficiency anemia secondary to inadequate dietary iron intake     CHIEF COMPLIANT: Recent hospitalization  INTERVAL HISTORY: [name] is a 60 year old with above-mentioned history of iron deficiency anemia who received IV iron therapy in October 2017. She had remarkable response to IV iron therapy with improvement in hemoglobin from 8.4 g to 12 g. Her energy levels had also improved. On November 1 she was admitted to the hospital with hypotension and suggestive of TIA. Since then she had quit smoking. For the hypotension she was started on Midodrine. She denies any fevers or chills. CT of the chest, abdomen and pelvis revealed small lung nodules related to smoking and bilateral soft tissue changes around the kidney suggestive of pyelonephritis although she does not have any fevers.  REVIEW OF SYSTEMS:   Constitutional: Denies fevers, chills or abnormal weight loss Eyes: Denies blurriness of vision Ears, nose, mouth, throat, and face: Denies mucositis or sore throat Respiratory: Denies cough, dyspnea or wheezes Cardiovascular: Denies palpitation, chest discomfort Gastrointestinal:  Denies nausea, heartburn or change in bowel habits Skin: Denies abnormal skin rashes Lymphatics: Denies new lymphadenopathy or easy bruising Neurological:Denies numbness, tingling or new weaknesses Behavioral/Psych: Mood is stable, no new changes  Extremities: No lower extremity edema All other systems were reviewed with the patient and are negative.  I have reviewed the past medical history, past surgical history, social history and family history with the patient and they are unchanged from previous note.  ALLERGIES:  is allergic to celebrex  [celecoxib]; crestor [rosuvastatin]; penicillins; and pravastatin.  MEDICATIONS:  Current Outpatient Prescriptions  Medication Sig Dispense Refill  . acetaminophen (TYLENOL) 500 MG tablet Take 500 mg by mouth every 6 (six) hours as needed for moderate pain or headache.     . albuterol (PROVENTIL HFA;VENTOLIN HFA) 108 (90 BASE) MCG/ACT inhaler Inhale 2 puffs into the lungs every 6 (six) hours as needed for wheezing or shortness of breath. 1 Inhaler 2  . aspirin EC 81 MG tablet Take 1 tablet (81 mg total) by mouth daily. 30 tablet 0  . cetirizine (ZYRTEC) 10 MG tablet Take 10 mg by mouth as needed for allergies.    . Cholecalciferol (VITAMIN D) 2000 UNITS CAPS Take 7000 units a day     . ezetimibe (ZETIA) 10 MG tablet Take 1 tablet (10 mg total) by mouth daily. 30 tablet 5  . FeFum-FePoly-FA-B Cmp-C-Biot (INTEGRA PLUS) CAPS Take 1 capsule by mouth daily. 30 capsule 4  . Fluticasone-Salmeterol (ADVAIR DISKUS) 250-50 MCG/DOSE AEPB Inhale 1 puff into the lungs every 12 (twelve) hours.    . midodrine (PROAMATINE) 5 MG tablet Take 1 tablet (5 mg total) by mouth 3 (three) times daily with meals. 90 tablet 0  . omeprazole (PRILOSEC) 40 MG capsule TAKE 1 CAPSULE (40 MG TOTAL) BY MOUTH 2 (TWO) TIMES DAILY. 180 capsule 1   No current facility-administered medications for this visit.     PHYSICAL EXAMINATION: ECOG PERFORMANCE STATUS: 1 - Symptomatic but completely ambulatory  Vitals:   09/03/16 0854  BP: (!) 112/59  Pulse: 87  Resp: 17  Temp: 97.6 F (36.4 C)   Filed Weights   09/03/16 0854  Weight: 96 lb 8 oz (43.8 kg)    GENERAL:alert, no distress and comfortable SKIN: skin color,  texture, turgor are normal, no rashes or significant lesions EYES: normal, Conjunctiva are pink and non-injected, sclera clear OROPHARYNX:no exudate, no erythema and lips, buccal mucosa, and tongue normal  NECK: supple, thyroid normal size, non-tender, without nodularity LYMPH:  no palpable lymphadenopathy in  the cervical, axillary or inguinal LUNGS: clear to auscultation and percussion with normal breathing effort HEART: regular rate & rhythm and no murmurs and no lower extremity edema ABDOMEN:abdomen soft, non-tender and normal bowel sounds MUSCULOSKELETAL:no cyanosis of digits and no clubbing  NEURO: alert & oriented x 3 with fluent speech, no focal motor/sensory deficits EXTREMITIES: No lower extremity edema  LABORATORY DATA:  I have reviewed the data as listed   Chemistry      Component Value Date/Time   NA 141 09/02/2016 1147   K 4.5 09/02/2016 1147   CL 113 (H) 08/19/2016 0744   CO2 20 (L) 09/02/2016 1147   BUN 13.1 09/02/2016 1147   CREATININE 0.8 09/02/2016 1147      Component Value Date/Time   CALCIUM 9.7 09/02/2016 1147   ALKPHOS 77 09/02/2016 1147   AST 16 09/02/2016 1147   ALT 9 09/02/2016 1147   BILITOT <0.22 09/02/2016 1147       Lab Results  Component Value Date   WBC 3.7 (L) 09/02/2016   HGB 12.6 09/02/2016   HCT 39.5 09/02/2016   MCV 95.2 09/02/2016   PLT 243 09/02/2016   NEUTROABS 1.3 (L) 09/02/2016     ASSESSMENT & PLAN:  Iron deficiency anemia Profound iron deficiency anemia since at least January 2016. Extensive workup with upper endoscopy, colonoscopy did not reveal any abnormalities. Capsule endoscopy was performed and patient does not know the results of that test. She does not have any other clinical evidence of bleeding.  CT chest abdomen pelvis 09/02/2016: No evidence of malignancy. Suggestive of bilateral pyelonephritis. Small lung nodules related to tobacco exposure   Smoking cessation: Patient quit smoking after she left the hospital. I congratulated her on this.  She does not have any other clinical evidence of bleeding.  Most recent blood 09/02/16 revealed hemoglobin 12.6 g an MCV 95.2 RDW 18.5, normal white cells and platelets.  return to clinic in 6 months with iron tests and CBC with differential   CT scan showing bilateral  pyelonephritis: Patient does not have any clinical evidence of infection. She does not have any fevers or chills. There is no tenderness in the back. However I am concerned about the CT findings I decided to treat her with 7 days of oral Levaquin.  No orders of the defined types were placed in this encounter.  The patient has a good understanding of the overall plan. she agrees with it. she will call with any problems that may develop before the next visit here.   Rulon Eisenmenger, MD 09/03/16

## 2016-09-06 ENCOUNTER — Other Ambulatory Visit: Payer: Self-pay | Admitting: *Deleted

## 2016-09-06 MED ORDER — MIDODRINE HCL 5 MG PO TABS: 5.0000 mg | ORAL_TABLET | Freq: Three times a day (TID) | ORAL | 0 refills | Status: DC

## 2016-10-13 ENCOUNTER — Other Ambulatory Visit: Payer: Self-pay

## 2016-10-13 MED ORDER — MIDODRINE HCL 5 MG PO TABS: 5.0000 mg | ORAL_TABLET | Freq: Three times a day (TID) | ORAL | 0 refills | Status: DC

## 2016-11-15 ENCOUNTER — Encounter: Payer: Self-pay | Admitting: Cardiology

## 2016-11-16 ENCOUNTER — Ambulatory Visit: Payer: Self-pay | Admitting: Physician Assistant

## 2016-11-18 ENCOUNTER — Other Ambulatory Visit: Payer: Self-pay | Admitting: Physician Assistant

## 2016-11-30 ENCOUNTER — Ambulatory Visit (INDEPENDENT_AMBULATORY_CARE_PROVIDER_SITE_OTHER): Payer: BLUE CROSS/BLUE SHIELD | Admitting: Physician Assistant

## 2016-11-30 ENCOUNTER — Encounter: Payer: Self-pay | Admitting: Physician Assistant

## 2016-11-30 VITALS — BP 100/60 | HR 94 | Temp 97.7°F | Resp 16 | Ht 61.0 in | Wt 100.4 lb

## 2016-11-30 DIAGNOSIS — I700 Atherosclerosis of aorta: Secondary | ICD-10-CM | POA: Insufficient documentation

## 2016-11-30 DIAGNOSIS — G459 Transient cerebral ischemic attack, unspecified: Secondary | ICD-10-CM

## 2016-11-30 DIAGNOSIS — I959 Hypotension, unspecified: Secondary | ICD-10-CM | POA: Diagnosis not present

## 2016-11-30 DIAGNOSIS — E782 Mixed hyperlipidemia: Secondary | ICD-10-CM

## 2016-11-30 DIAGNOSIS — F325 Major depressive disorder, single episode, in full remission: Secondary | ICD-10-CM

## 2016-11-30 DIAGNOSIS — N12 Tubulo-interstitial nephritis, not specified as acute or chronic: Secondary | ICD-10-CM | POA: Diagnosis not present

## 2016-11-30 DIAGNOSIS — D500 Iron deficiency anemia secondary to blood loss (chronic): Secondary | ICD-10-CM

## 2016-11-30 DIAGNOSIS — E559 Vitamin D deficiency, unspecified: Secondary | ICD-10-CM | POA: Diagnosis not present

## 2016-11-30 DIAGNOSIS — J449 Chronic obstructive pulmonary disease, unspecified: Secondary | ICD-10-CM

## 2016-11-30 DIAGNOSIS — Z79899 Other long term (current) drug therapy: Secondary | ICD-10-CM | POA: Diagnosis not present

## 2016-11-30 DIAGNOSIS — E538 Deficiency of other specified B group vitamins: Secondary | ICD-10-CM | POA: Diagnosis not present

## 2016-11-30 DIAGNOSIS — E441 Mild protein-calorie malnutrition: Secondary | ICD-10-CM

## 2016-11-30 LAB — BASIC METABOLIC PANEL WITH GFR
BUN: 32 mg/dL — ABNORMAL HIGH (ref 7–25)
CALCIUM: 9.8 mg/dL (ref 8.6–10.4)
CO2: 21 mmol/L (ref 20–31)
Chloride: 104 mmol/L (ref 98–110)
Creat: 0.8 mg/dL (ref 0.50–1.05)
GFR, Est Non African American: 82 mL/min (ref >=60)
GLUCOSE: 78 mg/dL (ref 65–99)
Potassium: 5.1 mmol/L (ref 3.5–5.3)
Sodium: 140 mmol/L (ref 135–146)

## 2016-11-30 LAB — URINALYSIS, ROUTINE W REFLEX MICROSCOPIC
BILIRUBIN URINE: NEGATIVE
GLUCOSE, UA: NEGATIVE
Hgb urine dipstick: NEGATIVE
Ketones, ur: NEGATIVE
Nitrite: NEGATIVE
PH: 6 (ref 5.0–8.0)
PROTEIN: NEGATIVE
Specific Gravity, Urine: 1.014 (ref 1.001–1.035)

## 2016-11-30 LAB — LIPID PANEL
Cholesterol: 229 mg/dL — ABNORMAL HIGH (ref <200)
HDL: 27 mg/dL — ABNORMAL LOW (ref >50)
LDL CALC: 154 mg/dL — AB (ref <100)
TRIGLYCERIDES: 239 mg/dL — AB (ref <150)
Total CHOL/HDL Ratio: 8.5 Ratio — ABNORMAL HIGH (ref <5.0)
VLDL: 48 mg/dL — ABNORMAL HIGH (ref <30)

## 2016-11-30 LAB — URINALYSIS, MICROSCOPIC ONLY
BACTERIA UA: NONE SEEN [HPF]
CASTS: NONE SEEN [LPF]
CRYSTALS: NONE SEEN [HPF]
RBC / HPF: NONE SEEN RBC/HPF (ref <=2)
Squamous Epithelial / LPF: NONE SEEN [HPF] (ref <=5)
YEAST: NONE SEEN [HPF]

## 2016-11-30 LAB — IRON AND TIBC
%SAT: 12 % (ref 11–50)
Iron: 61 ug/dL (ref 45–160)
TIBC: 508 ug/dL — AB (ref 250–450)
UIBC: 447 ug/dL — AB (ref 125–400)

## 2016-11-30 LAB — CBC WITH DIFFERENTIAL/PLATELET
Basophils Absolute: 56 cells/uL (ref 0–200)
Basophils Relative: 1 %
EOS ABS: 112 {cells}/uL (ref 15–500)
Eosinophils Relative: 2 %
HEMATOCRIT: 39.5 % (ref 35.0–45.0)
Hemoglobin: 13.1 g/dL (ref 11.7–15.5)
LYMPHS PCT: 34 %
Lymphs Abs: 1904 cells/uL (ref 850–3900)
MCH: 34.2 pg — ABNORMAL HIGH (ref 27.0–33.0)
MCHC: 33.2 g/dL (ref 32.0–36.0)
MCV: 103.1 fL — AB (ref 80.0–100.0)
MONO ABS: 448 {cells}/uL (ref 200–950)
MPV: 8.5 fL (ref 7.5–12.5)
Monocytes Relative: 8 %
NEUTROS PCT: 55 %
Neutro Abs: 3080 cells/uL (ref 1500–7800)
Platelets: 328 10*3/uL (ref 140–400)
RBC: 3.83 MIL/uL (ref 3.80–5.10)
RDW: 16.9 % — AB (ref 11.0–15.0)
WBC: 5.6 10*3/uL (ref 3.8–10.8)

## 2016-11-30 LAB — HEPATIC FUNCTION PANEL
ALBUMIN: 4.8 g/dL (ref 3.6–5.1)
ALT: 9 U/L (ref 6–29)
AST: 17 U/L (ref 10–35)
Alkaline Phosphatase: 69 U/L (ref 33–130)
Bilirubin, Direct: 0 mg/dL (ref <=0.2)
Indirect Bilirubin: 0.2 mg/dL (ref 0.2–1.2)
TOTAL PROTEIN: 7.7 g/dL (ref 6.1–8.1)
Total Bilirubin: 0.2 mg/dL (ref 0.2–1.2)

## 2016-11-30 LAB — TSH: TSH: 2.27 mIU/L

## 2016-11-30 LAB — MAGNESIUM: Magnesium: 2.1 mg/dL (ref 1.5–2.5)

## 2016-11-30 NOTE — Patient Instructions (Signed)
[name]  7 a.m.-6:30 p.m., Monday 7 a.m.-5 p.m., Tuesday-Friday Schedule an appointment by calling [phone].   Encourage you to get the 3D Mammogram  The 3D Mammogram is much more specific and sensitive to pick up breast cancer. For women with fibrocystic breast or lumpy breast it can be hard to determine if it is cancer or not but the 3D mammogram is able to tell this difference which cuts back on unneeded additional tests or scary call backs.   - over 40% increase in detection of breast cancer - over 40% reduction in false positives.  - fewer call backs - reduced anxiety - improved outcomes - [facility]   

## 2016-11-30 NOTE — Progress Notes (Signed)
Assessment and Plan:   Hypotension, unspecified hypotension type Increase fluids, monitor, continue med -     CBC with Differential/Platelet -     BASIC METABOLIC PANEL WITH GFR -     Hepatic function panel -     TSH  Transient cerebral ischemia, unspecified type Control blood pressure, cholesterol, glucose, increase exercise.   Mild malnutrition ([place]) Continue ensure, continue weight gain - had normal CT chest and AB - Get MGM -     TSH  Iron deficiency anemia due to chronic blood loss -     CBC with Differential/Platelet -     Iron and TIBC -     Ferritin  Chronic obstructive pulmonary disease, unspecified COPD type ([place]) Has stopped smoking, recent CT chest, continue meds.   Mixed hyperlipidemia -continue medications, check lipids, decrease fatty foods, increase activity.  -     Lipid panel  Medication management -     Magnesium  Depression, major, in remission ([place]) In remission, doing well.   Vitamin B 12 deficiency -     Vitamin B12  Pyelonephritis On recent CT AB, recheck urine -     Urine culture -     Urinalysis, Routine w reflex microscopic  Thoracic aorta atherosclerosis ([facility]) Control blood pressure, cholesterol, glucose, increase exercise.   Continue diet and meds as discussed. Further disposition pending results of labs. Over 30 minutes of exam, counseling, chart review, and critical decision making was performed Future Appointments Date Time Provider [place]  03/02/2017 10:00 AM [facility] LAB 3 [facility] None  03/03/2017 10:00 AM Nicholas Lose, MD [facility] None  03/08/2017 12:00 PM [facility] V[facility] 4 [facility]  03/08/2017 1:15 PM Sharmon Leyden Nickel, NP [facility]  06/28/2017 9:00 AM Vicie Mutters, PA-C [facility] None    HPI 61 y.o. female  presents for 3 month follow up on hypertension, cholesterol, prediabetes, and vitamin D deficiency.   Her blood pressure has been controlled at home, she has history of hypotension, she is on  midodrine 3 x a day, today their BP is BP: 100/60  She does not workout. She denies chest pain, shortness of breath, dizziness. She has history of carotid stenosis s/p carotid endarterectomy with Dr. Donnetta Hutching in March. 2017 She has COPD, has quit smoking, on albuterol and advair.  She has history of gastric ulcers. She weighs 100 lbs, has had some night sweats, had cancer work up in Nov, normal CT AB/chest, showed pyelonephritis, thoracoabdominal aortic atherosclerosis, and unchanged/stable lung nodules. Needs MGM, up to date colon/EGD.   She is not on cholesterol medication, she is on the zetia every other day due to cost. Her cholesterol is not at goal. The cholesterol last visit was:   Lab Results  Component Value Date   CHOL 170 06/23/2016   HDL 34 (L) 06/23/2016   LDLCALC 92 06/23/2016   TRIG 222 (H) 06/23/2016   CHOLHDL 5.0 06/23/2016   . Last A1C in the office was:  Lab Results  Component Value Date   HGBA1C 4.8 06/23/2016   Patient is on Vitamin D supplement.   Lab Results  Component Value Date   VD25OH 56 06/23/2016      Current Medications:  Current Outpatient Prescriptions on File Prior to Visit  Medication Sig Dispense Refill  . acetaminophen (TYLENOL) 500 MG tablet Take 500 mg by mouth every 6 (six) hours as needed for moderate pain or headache.     . albuterol (PROVENTIL HFA;VENTOLIN HFA) 108 (90 BASE) MCG/ACT inhaler  Inhale 2 puffs into the lungs every 6 (six) hours as needed for wheezing or shortness of breath. 1 Inhaler 2  . aspirin EC 81 MG tablet Take 1 tablet (81 mg total) by mouth daily. 30 tablet 0  . cetirizine (ZYRTEC) 10 MG tablet Take 10 mg by mouth as needed for allergies.    . Cholecalciferol (VITAMIN D) 2000 UNITS CAPS Take 7000 units a day     . ezetimibe (ZETIA) 10 MG tablet Take 1 tablet (10 mg total) by mouth daily. 30 tablet 5  . FeFum-FePoly-FA-B Cmp-C-Biot (INTEGRA PLUS) CAPS Take 1 capsule by mouth daily. 30 capsule 4  . Fluticasone-Salmeterol  (ADVAIR DISKUS) 250-50 MCG/DOSE AEPB Inhale 1 puff into the lungs every 12 (twelve) hours.    Marland Kitchen levofloxacin (LEVAQUIN) 500 MG tablet Take 1 tablet (500 mg total) by mouth daily. 7 tablet 0  . midodrine (PROAMATINE) 5 MG tablet TAKE 1 TABLET (5 MG TOTAL) BY MOUTH 3 (THREE) TIMES DAILY WITH MEALS. 90 tablet 0  . omeprazole (PRILOSEC) 40 MG capsule TAKE 1 CAPSULE (40 MG TOTAL) BY MOUTH 2 (TWO) TIMES DAILY. 180 capsule 1   No current facility-administered medications on file prior to visit.    Medical History:  Past Medical History:  Diagnosis Date  . Allergy   . Anemia   . Arthritis   . Asthma   . Blood transfusion without reported diagnosis   . Cataract   . COPD (chronic obstructive pulmonary disease) ([place])   . Duodenal ulcer   . Emphysema of lung ([place])   . Family history of malignant neoplasm of gastrointestinal tract   . GERD (gastroesophageal reflux disease)   . Hepatitis    had in 6th grade pt unsure which kind  . Hiatal hernia   . HTN (hypertension) 10/03/2014  . Hx of colonic polyps   . Hyperlipemia   . Internal hemorrhoids   . Osteoporosis   . Peripheral vascular disease ([place])   . Pneumonia    hx of  . Shortness of breath dyspnea    with ambulation  . Vitamin D deficiency    Allergies:  Allergies  Allergen Reactions  . Celebrex [Celecoxib]     GI bleed  . Crestor [Rosuvastatin]     Made legs hurt  . Penicillins     REACTION: whelps  . Pravastatin     Muscle aches    Review of Systems:  Review of Systems  Constitutional: Positive for diaphoresis (at night occ). Negative for chills, fever, malaise/fatigue and weight loss.  HENT: Negative.   Eyes: Negative.   Respiratory: Negative.   Cardiovascular: Negative.   Gastrointestinal: Negative.   Genitourinary: Negative.   Musculoskeletal: Negative.   Skin: Negative.   Neurological: Negative.  Negative for weakness.  Endo/Heme/Allergies: Negative.   Psychiatric/Behavioral: Negative.     Family history-  Review and unchanged Social history- Review and unchanged Physical Exam: BP 100/60   Pulse 94   Temp 97.7 F (36.5 C)   Resp 16   Ht 5\' 1"  (1.549 m)   Wt 100 lb 6.4 oz (45.5 kg)   SpO2 97%   BMI 18.97 kg/m  Wt Readings from Last 3 Encounters:  11/30/16 100 lb 6.4 oz (45.5 kg)  09/03/16 96 lb 8 oz (43.8 kg)  09/01/16 98 lb 9.6 oz (44.7 kg)   General Appearance: Well nourished, in no apparent distress. Eyes: PERRLA, EOMs, conjunctiva no swelling or erythema Sinuses: No Frontal/maxillary tenderness ENT/Mouth: Ext aud canals clear, TMs without erythema,  bulging. No erythema, swelling, or exudate on post pharynx.  Tonsils not swollen or erythematous. Hearing normal.  Neck: Supple, thyroid normal.  Respiratory: Respiratory effort normal, BS equal bilaterally without rales, rhonchi, wheezing or stridor.  Cardio: RRR with no MRGs. Brisk peripheral pulses without edema.  Abdomen: Soft, + BS,  Non tender, no guarding, rebound, hernias, masses. Lymphatics: Non tender without lymphadenopathy.  Musculoskeletal: Full ROM, 5/5 strength, Normal gait Skin: Warm, dry without rashes, lesions, ecchymosis.  Neuro: Cranial nerves intact. Normal muscle tone, no cerebellar symptoms. Psych: Awake and oriented X 3, normal affect, Insight and Judgment appropriate.    Vicie Mutters, PA-C 11:12 AM [facility]

## 2016-12-01 LAB — FERRITIN: Ferritin: 28 ng/mL (ref 10–232)

## 2016-12-01 LAB — URINE CULTURE: ORGANISM ID, BACTERIA: NO GROWTH

## 2016-12-01 LAB — VITAMIN B12: VITAMIN B 12: 645 pg/mL (ref 200–1100)

## 2016-12-01 NOTE — Progress Notes (Signed)
Pt aware of lab results & voiced understanding of those results.

## 2016-12-02 NOTE — Progress Notes (Signed)
Pt aware of lab results & voiced understanding of those results.

## 2016-12-16 ENCOUNTER — Other Ambulatory Visit: Payer: Self-pay | Admitting: Internal Medicine

## 2016-12-20 ENCOUNTER — Other Ambulatory Visit: Payer: Self-pay | Admitting: Internal Medicine

## 2017-01-01 IMAGING — CT CT ANGIO CHEST
3 of 6 series · 11 of 30 positions shown · IV contrast ([ID] OMNI 350)
Comparison: Chest CT December 12, 2008 and chest radiograph Dagostino

CLINICAL DATA: Shortness of Breath

EXAM:
CT ANGIOGRAPHY CHEST WITH CONTRAST
TECHNIQUE: Multidetector CT imaging of the chest was performed using the
standard protocol during bolus administration of intravenous
contrast. Multiplanar CT image reconstructions and MIPs were
obtained to evaluate the vascular anatomy.
CONTRAST:  100mL OMNIPAQUE IOHEXOL 350 MG/ML SOLN

[Series 4: pe 1.25 · axial · 0.64mm/px · z∈[-248,-35]mm · 6 of 240 slices shown]
[im 35/240  lung]
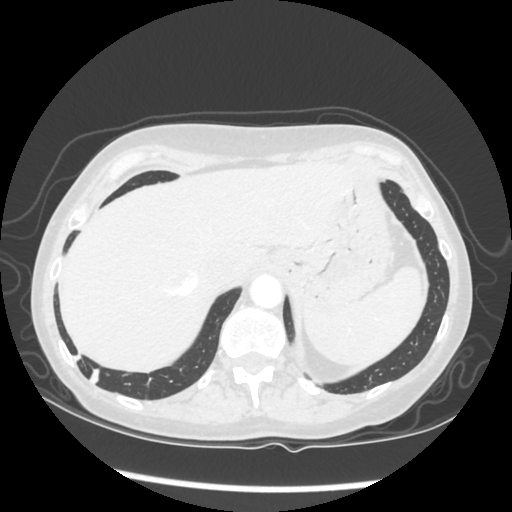
[im 69/240  mediastinal]
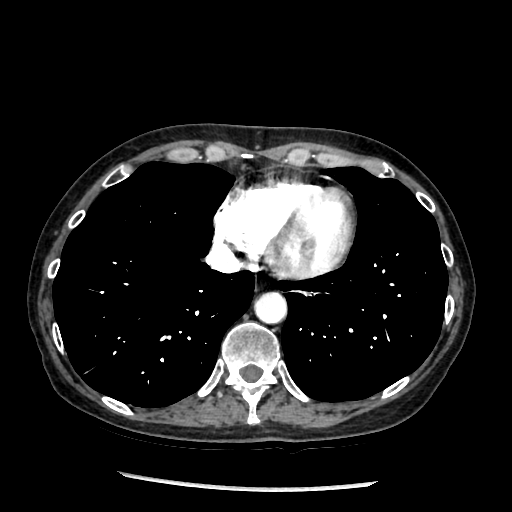
[im 103/240  lung]
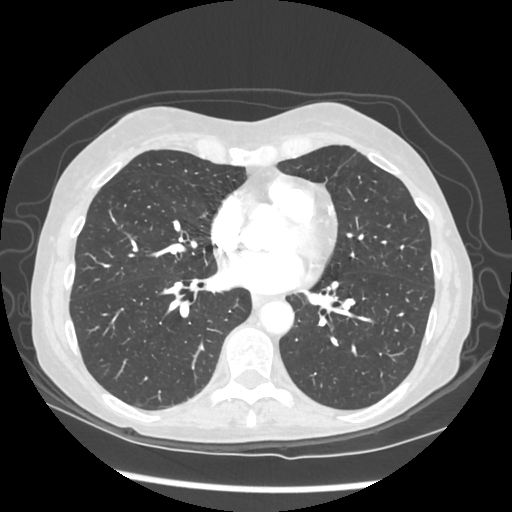
[im 137/240  mediastinal]
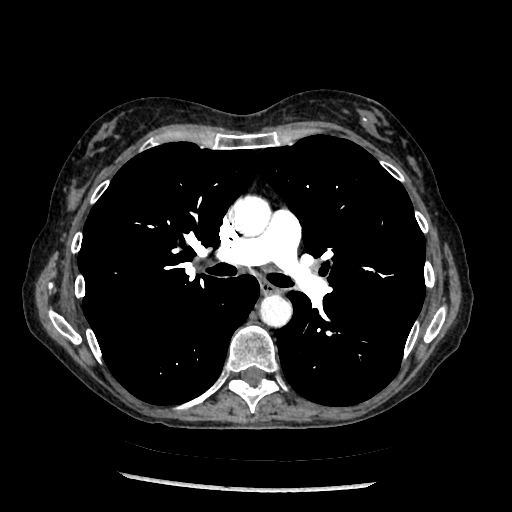
[im 171/240  lung]
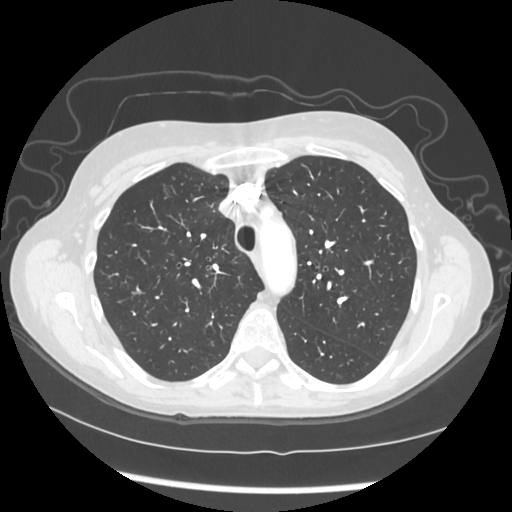
[im 205/240  mediastinal]
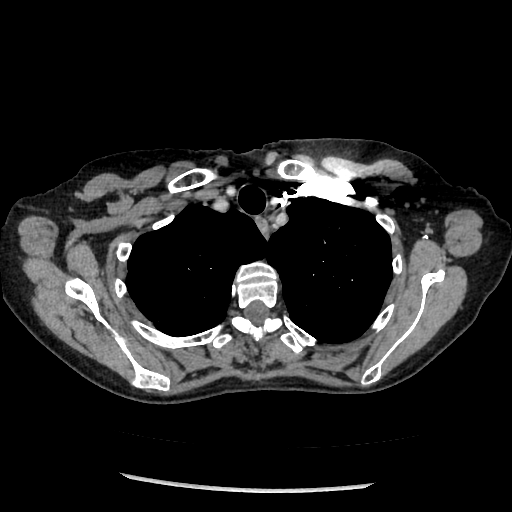

[Series 5: pe 2.5 · axial · 0.64mm/px · z∈[-192,-92]mm · 2 of 120 slices shown]
[im 40/120  lung]
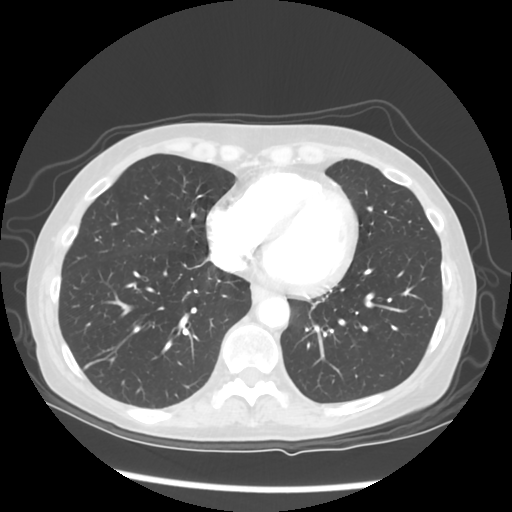
[im 80/120  lung]
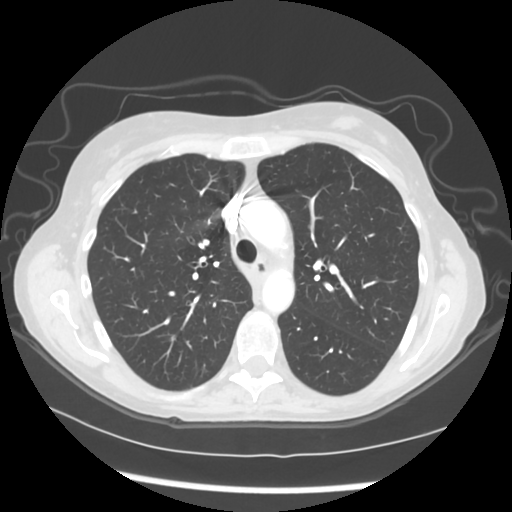

[Series 602: sagittal body · sagittal · 0.64mm/px · 3 of 133 slices shown]
[im 34/133  lung]
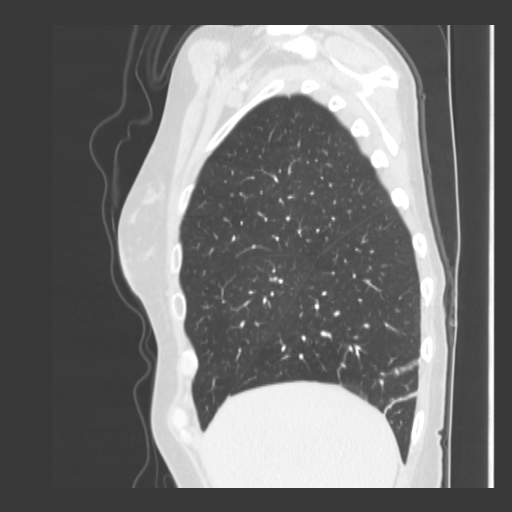
[im 67/133  lung]
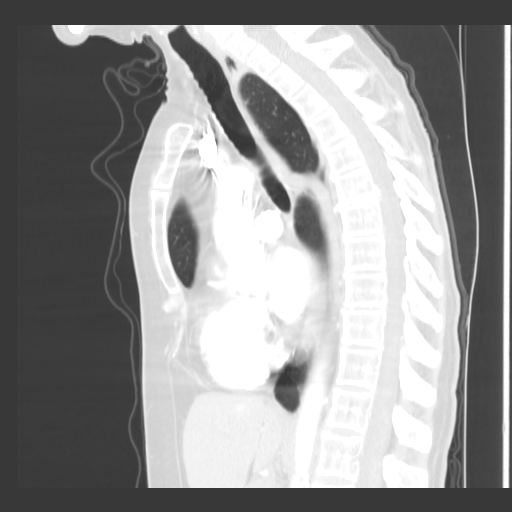
[im 100/133  lung]
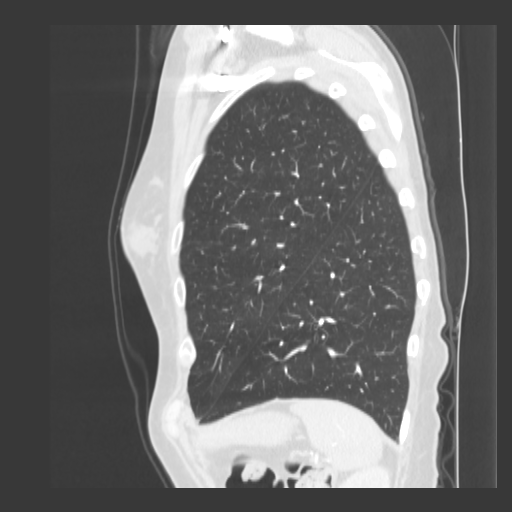

[11 of 30 positions shown; findings below may reference images not displayed]

FINDINGS: There is no demonstrable pulmonary embolus. There is no thoracic
aortic aneurysm or dissection. There is atherosclerotic change in
the aortic arch region.

Segment of the lingula. There is patchy atelectatic change in the
lateral segment of the right lower lobe. Minimal atelectatic changes
noted in medial left base and inferior lingula. There is mild lower
lobe bronchiectatic change bilaterally. Lungs elsewhere are clear.
There is no appreciable thoracic adenopathy. The pericardium is not
thickened. There is calcification and a peripheral branch of the
left anterior descending coronary artery.

Visualized upper abdominal structures appear unremarkable. There are
no blastic or lytic bone lesions. There old healed rib fractures on
the left. There are no blastic or lytic bone lesions. Thyroid
appears normal.

Review of the MIP images confirms the above findings.
IMPRESSION: No demonstrable pulmonary embolus. Stable 7 x 7 mm nodular opacity
in the superior segment of the lingula. Stability of this lesion
over time is consistent with benign etiology. Areas of patchy
atelectasis in the bases, slightly more on the right than on the
left. Mild lower lobe bronchiectatic change bilaterally. No frank
edema or consolidation. No adenopathy. Mild coronary artery
calcification.

## 2017-01-04 IMAGING — CR DG CHEST 2V
2 series · 2 of 2 positions shown · non-contrast
Comparison: None.

CLINICAL DATA: Shortness of breath.

EXAM:
CHEST  2 VIEW

[view not recorded (1 of 2)]
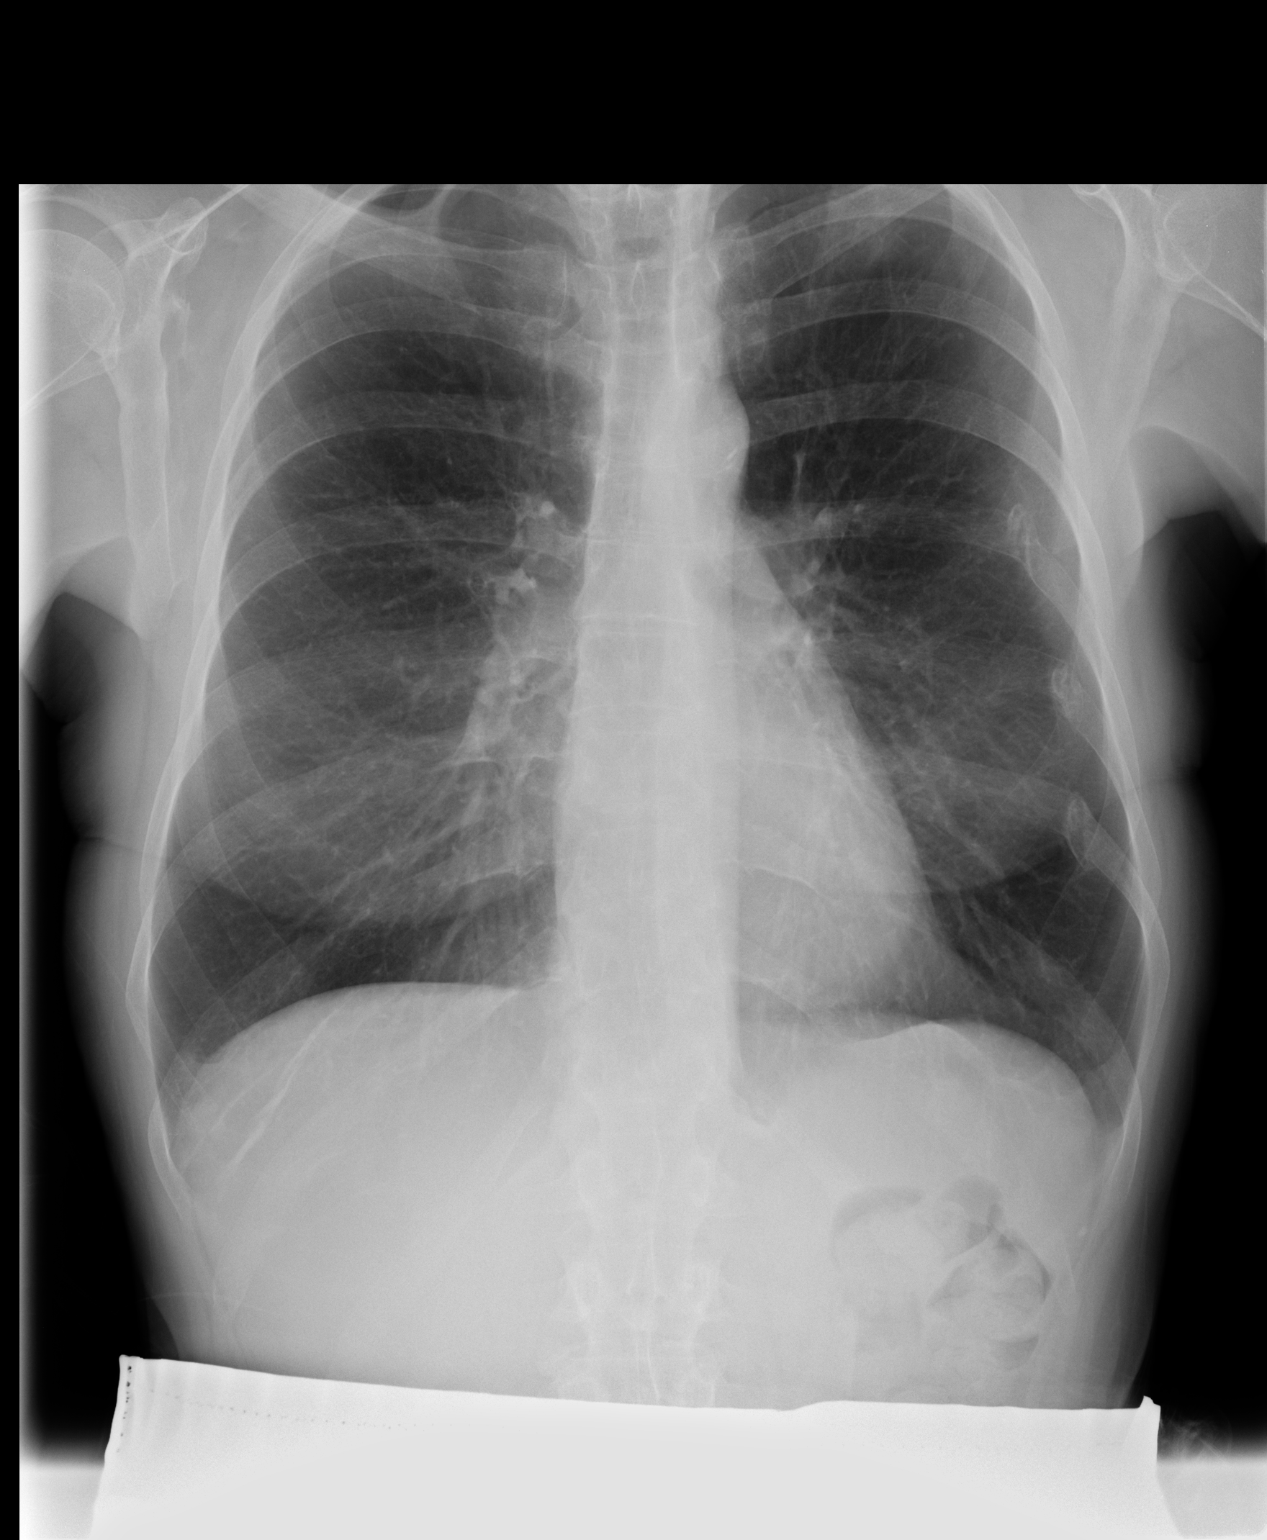

[view not recorded (2 of 2)]
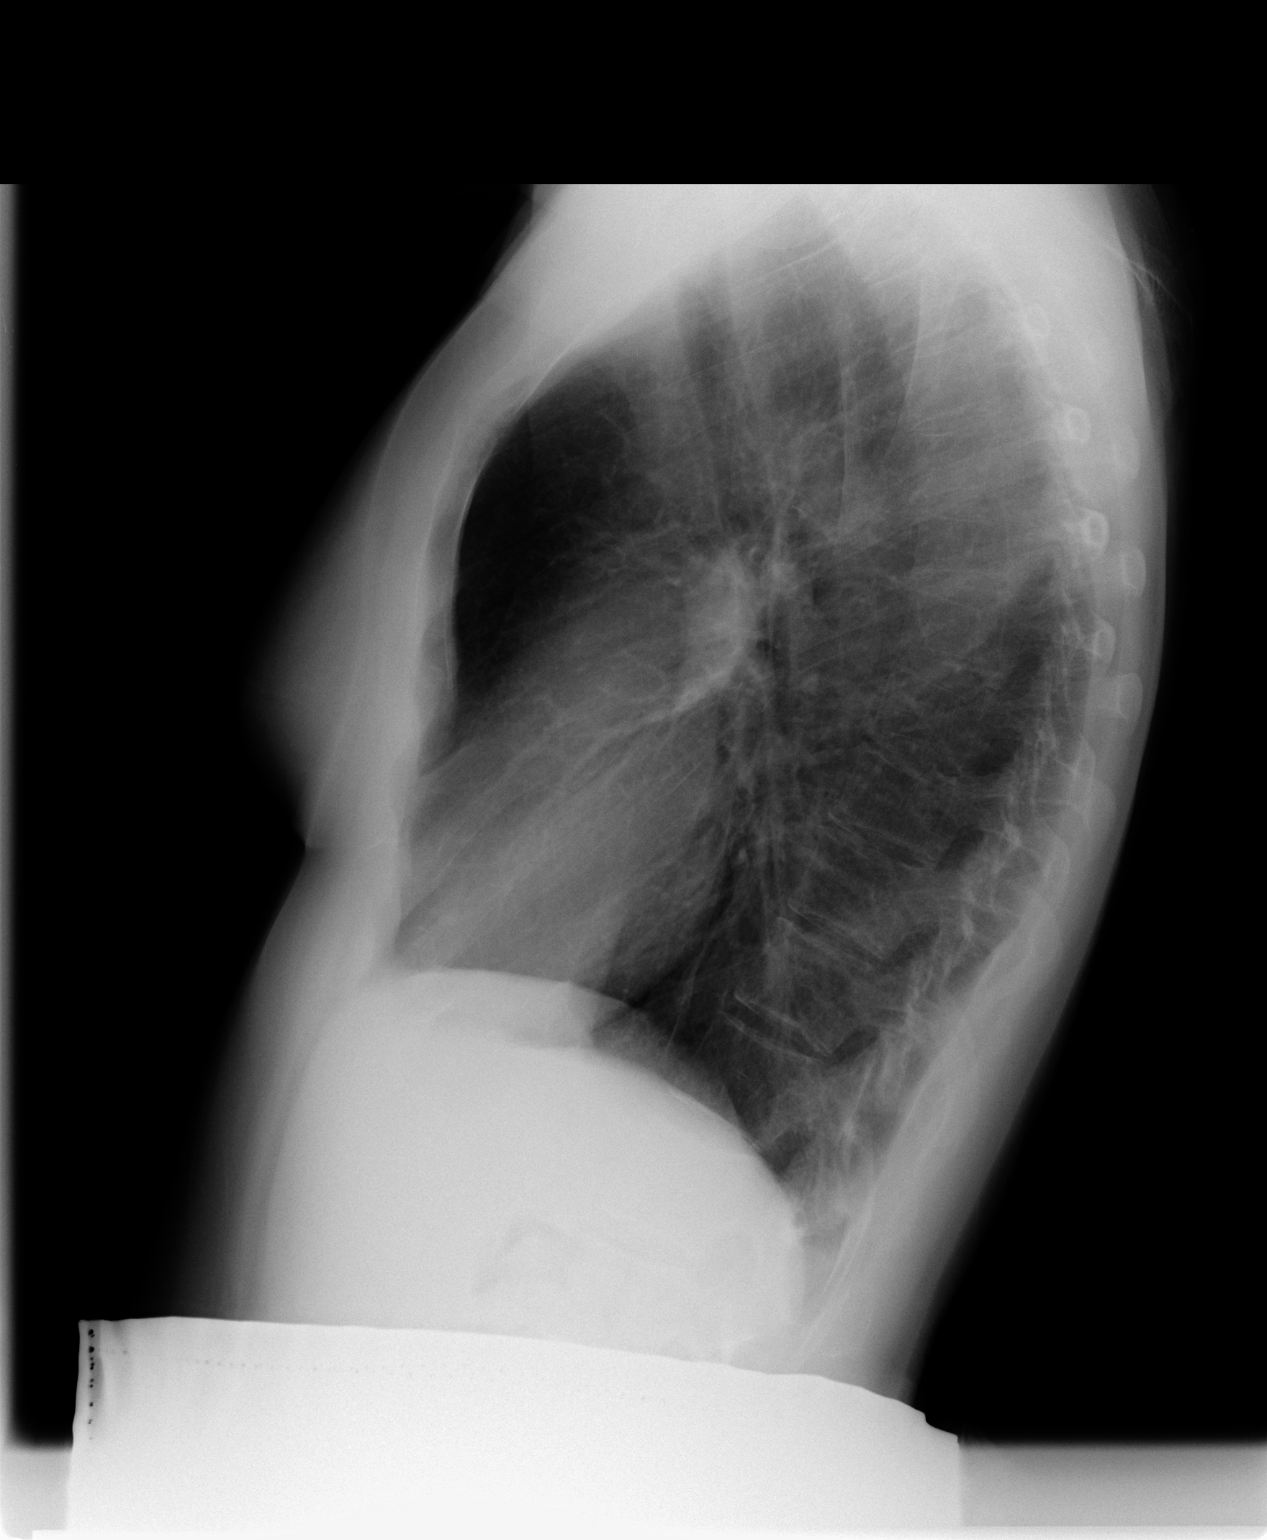

[2 of 2 positions shown; findings below may reference images not displayed]

FINDINGS: Mediastinum and hilar structures are normal. Lungs are clear of
infiltrates. No acute cardiopulmonary disease noted. Multiple bold
left rib fractures are noted.
IMPRESSION: No acute cardiopulmonary disease. Multiple left-sided old rib
fractures.

## 2017-01-19 ENCOUNTER — Other Ambulatory Visit: Payer: Self-pay | Admitting: Physician Assistant

## 2017-01-31 ENCOUNTER — Encounter: Payer: Self-pay | Admitting: Physician Assistant

## 2017-02-16 ENCOUNTER — Encounter: Payer: Self-pay | Admitting: Physician Assistant

## 2017-03-01 ENCOUNTER — Encounter: Payer: Self-pay | Admitting: Family

## 2017-03-02 ENCOUNTER — Other Ambulatory Visit: Payer: BLUE CROSS/BLUE SHIELD

## 2017-03-03 ENCOUNTER — Ambulatory Visit: Payer: BLUE CROSS/BLUE SHIELD | Admitting: Hematology and Oncology

## 2017-03-08 ENCOUNTER — Encounter (HOSPITAL_COMMUNITY): Payer: BLUE CROSS/BLUE SHIELD

## 2017-03-08 ENCOUNTER — Ambulatory Visit: Payer: BLUE CROSS/BLUE SHIELD | Admitting: Family

## 2017-03-10 ENCOUNTER — Other Ambulatory Visit: Payer: Self-pay | Admitting: Internal Medicine

## 2017-03-14 ENCOUNTER — Other Ambulatory Visit: Payer: Self-pay | Admitting: Physician Assistant

## 2017-03-15 ENCOUNTER — Encounter: Payer: Self-pay | Admitting: Physician Assistant

## 2017-03-20 NOTE — Assessment & Plan Note (Deleted)
Profound iron deficiency anemia since at least January 2016. Extensive workup with upper endoscopy, colonoscopy did not reveal any abnormalities. Capsule endoscopy was performed and patient does not know the results of that test. She does not have any other clinical evidence of bleeding.  CT chest abdomen pelvis 09/02/2016: No evidence of malignancy. Suggestive of bilateral pyelonephritis. Small lung nodules related to tobacco exposure   Blood Work Review:   RTC as needed

## 2017-03-21 ENCOUNTER — Ambulatory Visit: Payer: BLUE CROSS/BLUE SHIELD | Admitting: Hematology and Oncology

## 2017-03-21 ENCOUNTER — Other Ambulatory Visit: Payer: BLUE CROSS/BLUE SHIELD

## 2017-04-10 ENCOUNTER — Other Ambulatory Visit: Payer: Self-pay | Admitting: Physician Assistant

## 2017-04-14 ENCOUNTER — Encounter: Payer: Self-pay | Admitting: Physician Assistant

## 2017-04-18 ENCOUNTER — Encounter: Payer: Self-pay | Admitting: Family

## 2017-04-27 ENCOUNTER — Encounter: Payer: Self-pay | Admitting: Family

## 2017-04-27 ENCOUNTER — Ambulatory Visit (HOSPITAL_COMMUNITY)
Admission: RE | Admit: 2017-04-27 | Discharge: 2017-04-27 | Disposition: A | Discharge status: Home / Self Care | Payer: BLUE CROSS/BLUE SHIELD | Source: Ambulatory Visit | Attending: Vascular Surgery | Admitting: Vascular Surgery

## 2017-04-27 ENCOUNTER — Ambulatory Visit (INDEPENDENT_AMBULATORY_CARE_PROVIDER_SITE_OTHER): Payer: BLUE CROSS/BLUE SHIELD | Admitting: Family

## 2017-04-27 VITALS — BP 100/59 | HR 97 | Temp 97.4°F | Resp 18 | Ht 61.0 in | Wt 107.0 lb

## 2017-04-27 DIAGNOSIS — Z9889 Other specified postprocedural states: Secondary | ICD-10-CM | POA: Diagnosis not present

## 2017-04-27 DIAGNOSIS — I6521 Occlusion and stenosis of right carotid artery: Secondary | ICD-10-CM

## 2017-04-27 DIAGNOSIS — F17200 Nicotine dependence, unspecified, uncomplicated: Secondary | ICD-10-CM

## 2017-04-27 DIAGNOSIS — I6522 Occlusion and stenosis of left carotid artery: Secondary | ICD-10-CM | POA: Insufficient documentation

## 2017-04-27 LAB — VAS US CAROTID
LCCAPDIAS: 28 cm/s
LEFT ECA DIAS: -23 cm/s
LEFT VERTEBRAL DIAS: -31 cm/s
Left CCA dist dias: -20 cm/s
Left CCA dist sys: -75 cm/s
Left CCA prox sys: 131 cm/s
Left ICA dist dias: -45 cm/s
Left ICA dist sys: -121 cm/s
Left ICA prox dias: -18 cm/s
Left ICA prox sys: -61 cm/s
RCCADSYS: -124 cm/s
RCCAPDIAS: 22 cm/s
RCCAPSYS: 95 cm/s
RIGHT CCA MID DIAS: -22 cm/s
RIGHT ECA DIAS: -24 cm/s
RIGHT VERTEBRAL DIAS: -11 cm/s

## 2017-04-27 NOTE — Progress Notes (Signed)
Chief Complaint: Follow up Extracranial Carotid Artery Stenosis   History of Present Illness  [name] is a 61 y.o. female patient of Dr. Donnetta Hutching who is s/p right carotid endarterectomy for severe asymptomatic disease on 12/16/2014. She returns today for follow up.  The patient reports a history of TIA on 08-18-16 as manifested by slurred speech and dysarthria, admitted through the ED to [facility].  Discharge Summary: Hospital Course:  1. Transient neurological symptoms related to hypotension/hypoperfusion -resolved -MRI/MRA unremarkable for acute event -2D ECHO with normal EF and wall motion -continue ASA -PT/OT eval completed and HH recommended and set up  She denies any subsequent neurological episodes. She states her blood pressure was low at that time.    She states she is anemic and takes iron tablets, feels weak which she attributes to anemia. She had 2 iron transfusions in 2017. She states she has been evaluated for source of bleed, duodenal ulcer listed on her Medical History.   She likes to walk, is limited by generalized weakness, denies claudication type symptoms with walking. She feels that she is gradually improving with po iron and vitamin C supplementation.  Pt denies cardiac problems.  Pt Diabetic: no Pt smoker: smoker  (about 1 cigarette every 1-2 months, started in her teens)  Pt meds include: Statin : no, causes leg myalgias ASA: yes Other anticoagulants/antiplatelets: no   Past Medical History:  Diagnosis Date  . Allergy   . Anemia   . Arthritis   . Asthma   . Blood transfusion without reported diagnosis   . Cataract   . COPD (chronic obstructive pulmonary disease) ([place])   . Duodenal ulcer   . Emphysema of lung ([place])   . Family history of malignant neoplasm of gastrointestinal tract   . GERD (gastroesophageal reflux disease)   . Hepatitis    had in 6th grade pt unsure which kind  . Hiatal hernia   . HTN (hypertension) 10/03/2014  . Hx of  colonic polyps   . Hyperlipemia   . Internal hemorrhoids   . Osteoporosis   . Peripheral vascular disease ([place])   . Pneumonia    hx of  . Shortness of breath dyspnea    with ambulation  . Stroke ([place])   . Vitamin D deficiency     Social History Social History  Substance Use Topics  . Smoking status: Former Smoker    Packs/day: 0.25    Years: 36.00    Types: Cigarettes    Quit date: 06/18/2014  . Smokeless tobacco: Never Used     Comment: 1-2 sometimes not daily  . Alcohol use 0.0 oz/week     Comment: occasional    Family History Family History  Problem Relation Age of Onset  . Hypertension Father   . Hyperlipidemia Father   . Heart disease Father   . Heart attack Father   . Colon polyps Mother   . Inflammatory bowel disease Mother   . Hypertension Mother   . Stroke Mother   . Heart disease Mother   . Hyperlipidemia Mother   . Varicose Veins Mother   . Heart attack Mother   . AAA (abdominal aortic aneurysm) Mother   . Diabetes Maternal Grandfather   . Heart disease Maternal Grandfather   . Heart disease Maternal Grandmother   . Colon cancer Unknown        mat great aunt  . Heart disease Brother   . Hyperlipidemia Brother   . Hypertension Brother   . Heart  attack Brother   . AAA (abdominal aortic aneurysm) Brother   . Varicose Veins Daughter   . Esophageal cancer Neg Hx   . Stomach cancer Neg Hx   . Rectal cancer Neg Hx     Surgical History Past Surgical History:  Procedure Laterality Date  . ABDOMINAL HYSTERECTOMY    . APPENDECTOMY    . CATARACT EXTRACTION, BILATERAL    . COLONOSCOPY    . ELBOW SURGERY Right   . ENDARTERECTOMY Right 12/16/2014   Procedure: ENDARTERECTOMY CAROTID with Dacron patch;  Surgeon: Rosetta Posner, MD;  Location: [facility];  Service: Vascular;  Laterality: Right;  . ESOPHAGOGASTRODUODENOSCOPY  10/2008  . HAND SURGERY Left   . LUMBAR DISC SURGERY    . TOTAL HIP ARTHROPLASTY Right   . trigeminal neuralgia surgery    . UPPER  GASTROINTESTINAL ENDOSCOPY    . WRIST SURGERY Left     Allergies  Allergen Reactions  . Celebrex [Celecoxib]     GI bleed  . Crestor [Rosuvastatin]     Made legs hurt  . Penicillins     REACTION: whelps  . Pravastatin     Muscle aches    Current Outpatient Prescriptions  Medication Sig Dispense Refill  . acetaminophen (TYLENOL) 500 MG tablet Take 500 mg by mouth every 6 (six) hours as needed for moderate pain or headache.     . albuterol (PROVENTIL HFA;VENTOLIN HFA) 108 (90 BASE) MCG/ACT inhaler Inhale 2 puffs into the lungs every 6 (six) hours as needed for wheezing or shortness of breath. 1 Inhaler 2  . aspirin EC 81 MG tablet Take 1 tablet (81 mg total) by mouth daily. 30 tablet 0  . cetirizine (ZYRTEC) 10 MG tablet Take 10 mg by mouth as needed for allergies.    . Cholecalciferol (VITAMIN D) 2000 UNITS CAPS Take 7000 units a day     . ezetimibe (ZETIA) 10 MG tablet Take 1 tablet (10 mg total) by mouth daily. 30 tablet 5  . FeFum-FePoly-FA-B Cmp-C-Biot (INTEGRA PLUS) CAPS Take 1 capsule by mouth daily. 30 capsule 4  . Fluticasone-Salmeterol (ADVAIR DISKUS) 250-50 MCG/DOSE AEPB Inhale 1 puff into the lungs every 12 (twelve) hours.    . midodrine (PROAMATINE) 5 MG tablet TAKE 1 TABLET (5 MG TOTAL) BY MOUTH 3 (THREE) TIMES DAILY WITH MEALS. 270 tablet 0  . omeprazole (PRILOSEC) 40 MG capsule TAKE ONE CAPSULE BY MOUTH TWICE A DAY 180 capsule 0   No current facility-administered medications for this visit.     Review of Systems : See HPI for pertinent positives and negatives.  Physical Examination  Vitals:   04/27/17 1132 04/27/17 1134  BP: (!) 105/58 (!) 100/59  Pulse: 97   Resp: 18   Temp: (!) 97.4 F (36.3 C)   TempSrc: Oral   SpO2: 93%   Weight: 107 lb (48.5 kg)   Height: 5\' 1"  (1.549 m)    Body mass index is 20.22 kg/m.  General: WDWN thin female in NAD GAIT: normal Eyes: PERRLA Pulmonary:  Non-labored respirations, CTAB, no rales, rhonchi, or wheezing, but  she has a moist cough.  Cardiac: regular rhythm and rate, no detected murmur.  VASCULAR EXAM Carotid Bruits Right Left   Negative Negative     Abdominal aortic pulse is not palpable. Radial pulses are 2+ palpable right and 1+ palpable left  LE Pulses Right Left       POPLITEAL  not palpable   not palpable       POSTERIOR TIBIAL  not palpable   not palpable        DORSALIS PEDIS      ANTERIOR TIBIAL faintly palpable  faintly palpable     Gastrointestinal: soft, nontender, BS WNL, no r/g,  no palpable masses.  Musculoskeletal: no muscle atrophy/wasting. M/S 4/5 throughout, extremities without ischemic changes.  Neurologic: A&O X 3; appropriate affect, speech is normal, CN 2-12 intact, pain and light touch intact in extremities, motor exam as listed above.     Assessment: Mckenzy Salazar is a 61 y.o. female who is s/p right carotid endarterectomy for severe asymptomatic disease on 12/16/2014. She has no history of stroke or TIA.  Her atherosclerotic risk factors include rare smoking, she mostly quit in 2015, and COPD.   DATA Carotid Duplex (04/27/17): Right ICA (CEA) site with no restenosis. Left ICA: 1 - 39 % stenosis. Bilateral vertebral artery flow is antegrade.  Bilateral subclavian artery waveforms are normal.  No significant change from prior exams on 07/15/15 and 02/18/16.    Plan:  The patient was counseled re smoking cessation and given several free resources re smoking cessation.   Follow-up in 1 year with Carotid Duplex scan.    I discussed in depth with the patient the nature of atherosclerosis, and emphasized the importance of maximal medical management including strict control of blood pressure, blood glucose, and lipid levels, obtaining regular exercise, and cessation of smoking.  The patient is aware that  without maximal medical management the underlying atherosclerotic disease process will progress, limiting the benefit of any interventions. The patient was given information about stroke prevention and what symptoms should prompt the patient to seek immediate medical care. Thank you for allowing [place] to participate in this patient's care.  Clemon Chambers, RN, MSN, FNP-C [facility] Office: [phone]  Clinic Physician: Dickson/Chen  04/27/17 12:00 PM

## 2017-04-27 NOTE — Patient Instructions (Signed)
Stroke Prevention Some medical conditions and behaviors are associated with an increased chance of having a stroke. You may prevent a stroke by making healthy choices and managing medical conditions. How can I reduce my risk of having a stroke?  Stay physically active. Get at least 30 minutes of activity on most or all days.  Do not smoke. It may also be helpful to avoid exposure to secondhand smoke.  Limit alcohol use. Moderate alcohol use is considered to be: ? No more than 2 drinks per day for men. ? No more than 1 drink per day for nonpregnant women.  Eat healthy foods. This involves: ? Eating 5 or more servings of fruits and vegetables a day. ? Making dietary changes that address high blood pressure (hypertension), high cholesterol, diabetes, or obesity.  Manage your cholesterol levels. ? Making food choices that are high in fiber and low in saturated fat, trans fat, and cholesterol may control cholesterol levels. ? Take any prescribed medicines to control cholesterol as directed by your health care provider.  Manage your diabetes. ? Controlling your carbohydrate and sugar intake is recommended to manage diabetes. ? Take any prescribed medicines to control diabetes as directed by your health care provider.  Control your hypertension. ? Making food choices that are low in salt (sodium), saturated fat, trans fat, and cholesterol is recommended to manage hypertension. ? Ask your health care provider if you need treatment to lower your blood pressure. Take any prescribed medicines to control hypertension as directed by your health care provider. ? If you are 18-39 years of age, have your blood pressure checked every 3-5 years. If you are 40 years of age or older, have your blood pressure checked every year.  Maintain a healthy weight. ? Reducing calorie intake and making food choices that are low in sodium, saturated fat, trans fat, and cholesterol are recommended to manage  weight.  Stop drug abuse.  Avoid taking birth control pills. ? Talk to your health care provider about the risks of taking birth control pills if you are over 35 years old, smoke, get migraines, or have ever had a blood clot.  Get evaluated for sleep disorders (sleep apnea). ? Talk to your health care provider about getting a sleep evaluation if you snore a lot or have excessive sleepiness.  Take medicines only as directed by your health care provider. ? For some people, aspirin or blood thinners (anticoagulants) are helpful in reducing the risk of forming abnormal blood clots that can lead to stroke. If you have the irregular heart rhythm of atrial fibrillation, you should be on a blood thinner unless there is a good reason you cannot take them. ? Understand all your medicine instructions.  Make sure that other conditions (such as anemia or atherosclerosis) are addressed. Get help right away if:  You have sudden weakness or numbness of the face, arm, or leg, especially on one side of the body.  Your face or eyelid droops to one side.  You have sudden confusion.  You have trouble speaking (aphasia) or understanding.  You have sudden trouble seeing in one or both eyes.  You have sudden trouble walking.  You have dizziness.  You have a loss of balance or coordination.  You have a sudden, severe headache with no known cause.  You have new chest pain or an irregular heartbeat. Any of these symptoms may represent a serious problem that is an emergency. Do not wait to see if the symptoms will go away.   Get medical help at once. Call your local emergency services (911 in [place] Do not drive yourself to the hospital. This information is not intended to replace advice given to you by your health care provider. Make sure you discuss any questions you have with your health care provider. Document Released: 11/11/2004 Document Revised: 03/11/2016 Document Reviewed: 04/06/2013 Elsevier  Interactive Patient Education  2017 Elsevier Inc.     Preventing Cerebrovascular Disease Arteries are blood vessels that carry blood that contains oxygen from the heart to all parts of the body. Cerebrovascular disease affects arteries that supply the brain. Any condition that blocks or disrupts blood flow to the brain can cause cerebrovascular disease. Brain cells that lose blood supply start to die within minutes (stroke). Stroke is the main danger of cerebrovascular disease. Atherosclerosis and high blood pressure are common causes of cerebrovascular disease. Atherosclerosis is narrowing and hardening of an artery that results when fat, cholesterol, calcium, or other substances (plaque) build up inside an artery. Plaque reduces blood flow through the artery. High blood pressure increases the risk of bleeding inside the brain. Making diet and lifestyle changes to prevent atherosclerosis and high blood pressure lowers your risk of cerebrovascular disease. What nutrition changes can be made?  Eat more fruits, vegetables, and whole grains.  Reduce how much saturated fat you eat. To do this, eat less red meat and fewer full-fat dairy products.  Eat healthy proteins instead of red meat. Healthy proteins include: ? Fish. Eat fish that contains heart-healthy omega-3 fatty acids, twice a week. Examples include salmon, albacore tuna, mackerel, and herring. ? Chicken. ? Nuts. ? Low-fat or nonfat yogurt.  Avoid processed meats, like bacon and lunchmeat.  Avoid foods that contain: ? A lot of sugar, such as sweets and drinks with added sugar. ? A lot of salt (sodium). Avoid adding extra salt to your food, as told by your health care provider. ? Trans fats, such as margarine and baked goods. Trans fats may be listed as "partially hydrogenated oils" on food labels.  Check food labels to see how much sodium, sugar, and trans fats are in foods.  Use vegetable oils that contain low amounts of  saturated fat, such as olive oil or canola oil. What lifestyle changes can be made?  Drink alcohol in moderation. This means no more than 1 drink a day for nonpregnant women and 2 drinks a day for men. One drink equals 12 oz of beer, 5 oz of wine, or 1 oz of hard liquor.  If you are overweight, ask your health care provider to recommend a weight-loss plan for you. Losing 5-10 lb (2.2-4.5 kg) can reduce your risk of diabetes, atherosclerosis, and high blood pressure.  Exercise for 30?60 minutes on most days, or as much as told by your health care provider. ? Do moderate-intensity exercise, such as brisk walking, bicycling, and water aerobics. Ask your health care provider which activities are safe for you.  Do not use any products that contain nicotine or tobacco, such as cigarettes and e-cigarettes. If you need help quitting, ask your health care provider. Why are these changes important? Making these changes lowers your risk of many diseases that can cause cerebrovascular disease and stroke. Stroke is a leading cause of death and disability. Making these changes also improves your overall health and quality of life. What can I do to lower my risk? The following factors make you more likely to develop cerebrovascular disease:  Being overweight.  Smoking.  Being physically inactive.    Eating a high-fat diet.  Having certain health conditions, such as: ? Diabetes. ? High blood pressure. ? Heart disease. ? Atherosclerosis. ? High cholesterol. ? Sickle cell disease.  Talk with your health care provider about your risk for cerebrovascular disease. Work with your health care provider to control diseases that you have that may contribute to cerebrovascular disease. Your health care provider may prescribe medicines to help prevent major causes of cerebrovascular disease. Where to find more information: Learn more about preventing cerebrovascular disease from:  [place], Lung, and  [place]: MoAnalyst.de  Centers for Disease Control and Prevention: http://www.curry-wood.biz/  Summary  Cerebrovascular disease can lead to a stroke.  Atherosclerosis and high blood pressure are major causes of cerebrovascular disease.  Making diet and lifestyle changes can reduce your risk of cerebrovascular disease.  Work with your health care provider to get your risk factors under control to reduce your risk of cerebrovascular disease. This information is not intended to replace advice given to you by your health care provider. Make sure you discuss any questions you have with your health care provider.     Steps to Quit Smoking Smoking tobacco can be bad for your health. It can also affect almost every organ in your body. Smoking puts you and people around you at risk for many serious long-lasting (chronic) diseases. Quitting smoking is hard, but it is one of the best things that you can do for your health. It is never too late to quit. What are the benefits of quitting smoking? When you quit smoking, you lower your risk for getting serious diseases and conditions. They can include:  Lung cancer or lung disease.  Heart disease.  Stroke.  Heart attack.  Not being able to have children (infertility).  Weak bones (osteoporosis) and broken bones (fractures).  If you have coughing, wheezing, and shortness of breath, those symptoms may get better when you quit. You may also get sick less often. If you are pregnant, quitting smoking can help to lower your chances of having a baby of low birth weight. What can I do to help me quit smoking? Talk with your doctor about what can help you quit smoking. Some things you can do (strategies) include:  Quitting smoking totally, instead of slowly cutting back how much you smoke over a period of time.  Going to in-person counseling. You are more likely to quit if you go to many counseling  sessions.  Using resources and support systems, such as: ? Database administrator with a Social worker. ? Phone quitlines. ? Careers information officer. ? Support groups or group counseling. ? Text messaging programs. ? Mobile phone apps or applications.  Taking medicines. Some of these medicines may have nicotine in them. If you are pregnant or breastfeeding, do not take any medicines to quit smoking unless your doctor says it is okay. Talk with your doctor about counseling or other things that can help you.  Talk with your doctor about using more than one strategy at the same time, such as taking medicines while you are also going to in-person counseling. This can help make quitting easier. What things can I do to make it easier to quit? Quitting smoking might feel very hard at first, but there is a lot that you can do to make it easier. Take these steps:  Talk to your family and friends. Ask them to support and encourage you.  Call phone quitlines, reach out to support groups, or work with a Social worker.  Ask  people who smoke to not smoke around you.  Avoid places that make you want (trigger) to smoke, such as: ? Bars. ? Parties. ? Smoke-break areas at work.  Spend time with people who do not smoke.  Lower the stress in your life. Stress can make you want to smoke. Try these things to help your stress: ? Getting regular exercise. ? Deep-breathing exercises. ? Yoga. ? Meditating. ? Doing a body scan. To do this, close your eyes, focus on one area of your body at a time from head to toe, and notice which parts of your body are tense. Try to relax the muscles in those areas.  Download or buy apps on your mobile phone or tablet that can help you stick to your quit plan. There are many free apps, such as QuitGuide from the [employer] Office manager for Disease Control and Prevention). You can find more support from smokefree.gov and other websites.  This information is not intended to replace advice given to  you by your health care provider. Make sure you discuss any questions you have with your health care provider. Document Released: 07/31/2009 Document Revised: 06/01/2016 Document Reviewed: 02/18/2015 Elsevier Interactive Patient Education  2018 [place] Released: 10/19/2015 Document Revised: 04/23/2016 Document Reviewed: 10/19/2015 Elsevier Interactive Patient Education  2018 [employer].

## 2017-05-13 NOTE — Addendum Note (Signed)
Addended by: Lianne Cure A on: 05/13/2017 04:40 PM   Modules accepted: Orders

## 2017-06-08 ENCOUNTER — Other Ambulatory Visit: Payer: Self-pay | Admitting: Physician Assistant

## 2017-06-09 ENCOUNTER — Other Ambulatory Visit: Payer: Self-pay | Admitting: Internal Medicine

## 2017-06-26 NOTE — Progress Notes (Deleted)
Assessment and Plan:   Hypotension, unspecified hypotension type Increase fluids, monitor, continue med -     CBC with Differential/Platelet -     BASIC METABOLIC PANEL WITH GFR -     Hepatic function panel -     TSH  Transient cerebral ischemia, unspecified type Control blood pressure, cholesterol, glucose, increase exercise.   Mild malnutrition ([place]) Continue ensure, continue weight gain - had normal CT chest and AB - Get MGM -     TSH  Iron deficiency anemia due to chronic blood loss -     CBC with Differential/Platelet -     Iron and TIBC -     Ferritin  Chronic obstructive pulmonary disease, unspecified COPD type ([place]) Has stopped smoking, recent CT chest, continue meds.   Mixed hyperlipidemia -continue medications, check lipids, decrease fatty foods, increase activity.  -     Lipid panel  Medication management -     Magnesium  Depression, major, in remission ([place]) In remission, doing well.   Vitamin B 12 deficiency -     Vitamin B12  Pyelonephritis On recent CT AB, recheck urine -     Urine culture -     Urinalysis, Routine w reflex microscopic  Thoracic aorta atherosclerosis (HCC) Control blood pressure, cholesterol, glucose, increase exercise.   Continue diet and meds as discussed. Further disposition pending results of labs. Over 30 minutes of exam, counseling, chart review, and critical decision making was performed Future Appointments Date Time Provider [place]  06/28/2017 9:00 AM Vicie Mutters, PA-C [facility] None  05/04/2018 10:00 AM [facility] 6 [facility]  05/04/2018 10:45 AM Nickel, Sharmon Leyden, NP [facility]  07/03/2018 10:00 AM Vicie Mutters, PA-C [facility] None    HPI 61 y.o. female  presents for 3 month follow up on hypertension, cholesterol, prediabetes, and vitamin D deficiency.   Her blood pressure has been controlled at home, she has history of hypotension, she is on midodrine 3 x a day, today their BP is    She does  not workout. She denies chest pain, shortness of breath, dizziness. She has history of carotid stenosis s/p carotid endarterectomy with Dr. Donnetta Hutching in March. 2017 She has COPD, has quit smoking, on albuterol and advair.  She has history of gastric ulcers. She weighs 100 lbs, has had some night sweats, had cancer work up in Nov, normal CT AB/chest, showed pyelonephritis, thoracoabdominal aortic atherosclerosis, and unchanged/stable lung nodules. Needs MGM, up to date colon/EGD.   She is not on cholesterol medication, she is on the zetia every other day due to cost. Her cholesterol is not at goal. The cholesterol last visit was:   Lab Results  Component Value Date   CHOL 229 (H) 11/30/2016   HDL 27 (L) 11/30/2016   LDLCALC 154 (H) 11/30/2016   TRIG 239 (H) 11/30/2016   CHOLHDL 8.5 (H) 11/30/2016   . Last A1C in the office was:  Lab Results  Component Value Date   HGBA1C 4.8 06/23/2016   Patient is on Vitamin D supplement.   Lab Results  Component Value Date   VD25OH 56 06/23/2016      Current Medications:  Current Outpatient Prescriptions on File Prior to Visit  Medication Sig Dispense Refill  . acetaminophen (TYLENOL) 500 MG tablet Take 500 mg by mouth every 6 (six) hours as needed for moderate pain or headache.     . albuterol (PROVENTIL HFA;VENTOLIN HFA) 108 (90 BASE) MCG/ACT inhaler Inhale 2 puffs into the lungs  every 6 (six) hours as needed for wheezing or shortness of breath. 1 Inhaler 2  . aspirin EC 81 MG tablet Take 1 tablet (81 mg total) by mouth daily. 30 tablet 0  . cetirizine (ZYRTEC) 10 MG tablet Take 10 mg by mouth as needed for allergies.    . Cholecalciferol (VITAMIN D) 2000 UNITS CAPS Take 7000 units a day     . ezetimibe (ZETIA) 10 MG tablet Take 1 tablet (10 mg total) by mouth daily. 30 tablet 5  . FeFum-FePoly-FA-B Cmp-C-Biot (INTEGRA PLUS) CAPS Take 1 capsule by mouth daily. 30 capsule 4  . Fluticasone-Salmeterol (ADVAIR DISKUS) 250-50 MCG/DOSE AEPB Inhale 1 puff  into the lungs every 12 (twelve) hours.    . midodrine (PROAMATINE) 5 MG tablet TAKE 1 TABLET (5 MG TOTAL) BY MOUTH 3 (THREE) TIMES DAILY WITH MEALS. 270 tablet 0  . omeprazole (PRILOSEC) 20 MG capsule TAKE 1 CAPSULE BY MOUTH TWICE A DAY 180 capsule 0  . omeprazole (PRILOSEC) 40 MG capsule TAKE ONE CAPSULE BY MOUTH TWICE A DAY 60 capsule 0   No current facility-administered medications on file prior to visit.    Medical History:  Past Medical History:  Diagnosis Date  . Allergy   . Anemia   . Arthritis   . Asthma   . Blood transfusion without reported diagnosis   . Cataract   . COPD (chronic obstructive pulmonary disease) ([place])   . Duodenal ulcer   . Emphysema of lung ([place])   . Family history of malignant neoplasm of gastrointestinal tract   . GERD (gastroesophageal reflux disease)   . Hepatitis    had in 6th grade pt unsure which kind  . Hiatal hernia   . HTN (hypertension) 10/03/2014  . Hx of colonic polyps   . Hyperlipemia   . Internal hemorrhoids   . Osteoporosis   . Peripheral vascular disease ([place])   . Pneumonia    hx of  . Shortness of breath dyspnea    with ambulation  . Stroke ([place])   . Vitamin D deficiency    Allergies:  Allergies  Allergen Reactions  . Celebrex [Celecoxib]     GI bleed  . Crestor [Rosuvastatin]     Made legs hurt  . Penicillins     REACTION: whelps  . Pravastatin     Muscle aches    Review of Systems:  Review of Systems  Constitutional: Positive for diaphoresis (at night occ). Negative for chills, fever, malaise/fatigue and weight loss.  HENT: Negative.   Eyes: Negative.   Respiratory: Negative.   Cardiovascular: Negative.   Gastrointestinal: Negative.   Genitourinary: Negative.   Musculoskeletal: Negative.   Skin: Negative.   Neurological: Negative.  Negative for weakness.  Endo/Heme/Allergies: Negative.   Psychiatric/Behavioral: Negative.     Family history- Review and unchanged Social history- Review and  unchanged Physical Exam: There were no vitals taken for this visit. Wt Readings from Last 3 Encounters:  04/27/17 107 lb (48.5 kg)  11/30/16 100 lb 6.4 oz (45.5 kg)  09/03/16 96 lb 8 oz (43.8 kg)   General Appearance: Well nourished, in no apparent distress. Eyes: PERRLA, EOMs, conjunctiva no swelling or erythema Sinuses: No Frontal/maxillary tenderness ENT/Mouth: Ext aud canals clear, TMs without erythema, bulging. No erythema, swelling, or exudate on post pharynx.  Tonsils not swollen or erythematous. Hearing normal.  Neck: Supple, thyroid normal.  Respiratory: Respiratory effort normal, BS equal bilaterally without rales, rhonchi, wheezing or stridor.  Cardio: RRR with no MRGs. Brisk  peripheral pulses without edema.  Abdomen: Soft, + BS,  Non tender, no guarding, rebound, hernias, masses. Lymphatics: Non tender without lymphadenopathy.  Musculoskeletal: Full ROM, 5/5 strength, Normal gait Skin: Warm, dry without rashes, lesions, ecchymosis.  Neuro: Cranial nerves intact. Normal muscle tone, no cerebellar symptoms. Psych: Awake and oriented X 3, normal affect, Insight and Judgment appropriate.    Vicie Mutters, PA-C 3:38 PM [facility]

## 2017-06-28 ENCOUNTER — Encounter: Payer: Self-pay | Admitting: Physician Assistant

## 2017-07-23 ENCOUNTER — Other Ambulatory Visit: Payer: Self-pay | Admitting: Internal Medicine

## 2017-08-16 ENCOUNTER — Ambulatory Visit (INDEPENDENT_AMBULATORY_CARE_PROVIDER_SITE_OTHER): Payer: BLUE CROSS/BLUE SHIELD | Admitting: Physician Assistant

## 2017-08-16 ENCOUNTER — Encounter: Payer: Self-pay | Admitting: Physician Assistant

## 2017-08-16 VITALS — BP 120/80 | HR 64 | Resp 16 | Ht 61.0 in | Wt 103.8 lb

## 2017-08-16 DIAGNOSIS — E441 Mild protein-calorie malnutrition: Secondary | ICD-10-CM | POA: Diagnosis not present

## 2017-08-16 DIAGNOSIS — J441 Chronic obstructive pulmonary disease with (acute) exacerbation: Secondary | ICD-10-CM

## 2017-08-16 MED ORDER — PROMETHAZINE-DM 6.25-15 MG/5ML PO SYRP: 5.0000 mL | ORAL_SOLUTION | Freq: Four times a day (QID) | ORAL | 1 refills | Status: DC | PRN

## 2017-08-16 MED ORDER — ALBUTEROL SULFATE HFA 108 (90 BASE) MCG/ACT IN AERS: 2.0000 | INHALATION_SPRAY | Freq: Four times a day (QID) | RESPIRATORY_TRACT | 2 refills | Status: DC | PRN

## 2017-08-16 MED ORDER — DOXYCYCLINE HYCLATE 100 MG PO CAPS: ORAL_CAPSULE | ORAL | 0 refills | Status: DC

## 2017-08-16 MED ORDER — PREDNISONE 20 MG PO TABS: ORAL_TABLET | ORAL | 0 refills | Status: DC

## 2017-08-16 NOTE — Progress Notes (Signed)
Subjective:    Patient ID: [name], female    DOB: [birth year], 61 y.o.   MRN: [id number]  HPI 61 y.o. WF with history of COPD presents with cough x 1 week. She has had chills, cough, SOB, no CP. She is on advair and albuterol.   Blood pressure 120/80, pulse 64, resp. rate 16, height 5\' 1"  (1.549 m), weight 103 lb 12.8 oz (47.1 kg), SpO2 93 %.  Medications Current Outpatient Prescriptions on File Prior to Visit  Medication Sig  . acetaminophen (TYLENOL) 500 MG tablet Take 500 mg by mouth every 6 (six) hours as needed for moderate pain or headache.   . albuterol (PROVENTIL HFA;VENTOLIN HFA) 108 (90 BASE) MCG/ACT inhaler Inhale 2 puffs into the lungs every 6 (six) hours as needed for wheezing or shortness of breath.  Marland Kitchen aspirin EC 81 MG tablet Take 1 tablet (81 mg total) by mouth daily.  . cetirizine (ZYRTEC) 10 MG tablet Take 10 mg by mouth as needed for allergies.  . Cholecalciferol (VITAMIN D) 2000 UNITS CAPS Take 7000 units a day   . ezetimibe (ZETIA) 10 MG tablet Take 1 tablet (10 mg total) by mouth daily.  Marland Kitchen FeFum-FePoly-FA-B Cmp-C-Biot (INTEGRA PLUS) CAPS Take 1 capsule by mouth daily.  . Fluticasone-Salmeterol (ADVAIR DISKUS) 250-50 MCG/DOSE AEPB Inhale 1 puff into the lungs every 12 (twelve) hours.  Marland Kitchen omeprazole (PRILOSEC) 20 MG capsule TAKE 1 CAPSULE BY MOUTH TWICE A DAY   No current facility-administered medications on file prior to visit.     Problem list She has COLONIC POLYPS; Mixed hyperlipidemia; Internal hemorrhoids; GERD; Gastric ulcer; Duodenal ulcer without hemorrhage or perforation and without obstruction; COPD (chronic obstructive pulmonary disease) (HCC); Asthma; Vitamin D deficiency; HTN (hypertension); Abnormal glucose; Medication management; Carotid stenosis, asymptomatic; Osteoporosis; Depression, major, in remission (HCC); Mild malnutrition (HCC); Anemia; Iron deficiency anemia; Left-sided weakness; TIA (transient ischemic attack); Hypotension;  Protein-calorie malnutrition, severe; and Thoracic aorta atherosclerosis (HCC) on her problem list.   Review of Systems  Constitutional: Positive for fatigue. Negative for chills and fever.  HENT: Positive for congestion, rhinorrhea, sinus pressure and sore throat. Negative for dental problem, ear discharge, ear pain, nosebleeds, trouble swallowing and voice change.   Respiratory: Positive for cough, chest tightness, shortness of breath and wheezing.   Cardiovascular: Negative.   Gastrointestinal: Negative.   Genitourinary: Negative.   Musculoskeletal: Negative.   Neurological: Negative.        Objective:   Physical Exam  Constitutional: She is oriented to person, place, and time. She appears well-developed and well-nourished.  HENT:  Head: Normocephalic and atraumatic.  Right Ear: External ear normal.  Left Ear: External ear normal.  Nose: Nose normal.  Mouth/Throat: Oropharynx is clear and moist.  Eyes: Pupils are equal, round, and reactive to light. Conjunctivae are normal.  Neck: Normal range of motion. Neck supple.  Cardiovascular: Normal rate and regular rhythm.   Pulmonary/Chest: Effort normal. No respiratory distress. She has wheezes. She has no rales. She exhibits no tenderness.  Abdominal: Soft. Bowel sounds are normal.  Lymphadenopathy:    She has no cervical adenopathy.  Neurological: She is alert and oriented to person, place, and time.  Skin: Skin is warm and dry.      Assessment & Plan:   Mild malnutrition (HCC) Increase food, add boost/ensure  COPD exacerbation (HCC) -     predniSONE (DELTASONE) 20 MG tablet; 2 tablets daily for 3 days, 1 tablet daily for 4 days. -     doxycycline (  VIBRAMYCIN) 100 MG capsule; Take 1 capsule twice daily with food -     promethazine-dextromethorphan (PROMETHAZINE-DM) 6.25-15 MG/5ML syrup; Take 5 mLs by mouth 4 (four) times daily as needed for cough. -     albuterol (PROVENTIL HFA;VENTOLIN HFA) 108 (90 Base) MCG/ACT inhaler;  Inhale 2 puffs into the lungs every 6 (six) hours as needed for wheezing or shortness of breath.  The patient was advised to call immediately if she has any concerning symptoms in the interval. The patient voices understanding of current treatment options and is in agreement with the current care plan.The patient knows to call the clinic with any problems, questions or concerns or go to the ER if any further progression of symptoms.

## 2017-08-16 NOTE — Patient Instructions (Signed)
Chronic Obstructive Pulmonary Disease Exacerbation Chronic obstructive pulmonary disease (COPD) is a common lung condition in which airflow from the lungs is limited. COPD is a general term that can be used to describe many different lung problems that limit airflow, including chronic bronchitis and emphysema. COPD exacerbations are episodes when breathing symptoms become much worse and require extra treatment. Without treatment, COPD exacerbations can be life threatening, and frequent COPD exacerbations can cause further damage to your lungs. What are the causes?  Respiratory infections.  Exposure to smoke.  Exposure to air pollution, chemical fumes, or dust. Sometimes there is no apparent cause or trigger. What increases the risk?  Smoking cigarettes.  Older age.  Frequent prior COPD exacerbations. What are the signs or symptoms?  Increased coughing.  Increased thick spit (sputum) production.  Increased wheezing.  Increased shortness of breath.  Rapid breathing.  Chest tightness. How is this diagnosed? Your medical history, a physical exam, and tests will help your health care provider make a diagnosis. Tests may include:  A chest X-ray.  Basic lab tests.  Sputum testing.  An arterial blood gas test.  How is this treated? Depending on the severity of your COPD exacerbation, you may need to be admitted to a hospital for treatment. Some of the treatments commonly used to treat COPD exacerbations are:  Antibiotic medicines.  Bronchodilators. These are drugs that expand the air passages. They may be given with an inhaler or nebulizer. Spacer devices may be needed to help improve drug delivery.  Corticosteroid medicines.  Supplemental oxygen therapy.  Airway clearing techniques, such as noninvasive ventilation (NIV) and positive expiratory pressure (PEP). These provide respiratory support through a mask or other noninvasive device.  Follow these instructions at  home:  Do not smoke. Quitting smoking is very important to prevent COPD from getting worse and exacerbations from happening as often.  Avoid exposure to all substances that irritate the airway, especially to tobacco smoke.  If you were prescribed an antibiotic medicine, finish it all even if you start to feel better.  Take all medicines as directed by your health care provider.It is important to use correct technique with inhaled medicines.  Drink enough fluids to keep your urine clear or pale yellow (unless you have a medical condition that requires fluid restriction).  Use a cool mist vaporizer. This makes it easier to clear your chest when you cough.  If you have a home nebulizer and oxygen, continue to use them as directed.  Maintain all necessary vaccinations to prevent infections.  Exercise regularly.  Eat a healthy diet.  Keep all follow-up appointments as directed by your health care provider. Get help right away if:  You have worsening shortness of breath.  You have trouble talking.  You have severe chest pain.  You have blood in your sputum.  You have a fever.  You have weakness, vomit repeatedly, or faint.  You feel confused.  You continue to get worse. This information is not intended to replace advice given to you by your health care provider. Make sure you discuss any questions you have with your health care provider. Document Released: 08/01/2007 Document Revised: 03/11/2016 Document Reviewed: 06/08/2013 [employer] Interactive Patient Education  2017 [employer]  

## 2017-08-19 ENCOUNTER — Other Ambulatory Visit: Payer: Self-pay | Admitting: Internal Medicine
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
## 2017-09-14 ENCOUNTER — Ambulatory Visit (INDEPENDENT_AMBULATORY_CARE_PROVIDER_SITE_OTHER): Payer: BLUE CROSS/BLUE SHIELD | Admitting: Physician Assistant

## 2017-09-14 ENCOUNTER — Encounter: Payer: Self-pay | Admitting: Physician Assistant

## 2017-09-14 VITALS — BP 130/86 | HR 63 | Temp 97.4°F | Resp 14 | Ht 62.0 in | Wt 106.2 lb

## 2017-09-14 DIAGNOSIS — J441 Chronic obstructive pulmonary disease with (acute) exacerbation: Secondary | ICD-10-CM

## 2017-09-14 DIAGNOSIS — K219 Gastro-esophageal reflux disease without esophagitis: Secondary | ICD-10-CM

## 2017-09-14 DIAGNOSIS — Z0000 Encounter for general adult medical examination without abnormal findings: Secondary | ICD-10-CM

## 2017-09-14 DIAGNOSIS — Z79899 Other long term (current) drug therapy: Secondary | ICD-10-CM | POA: Diagnosis not present

## 2017-09-14 DIAGNOSIS — E782 Mixed hyperlipidemia: Secondary | ICD-10-CM

## 2017-09-14 DIAGNOSIS — B370 Candidal stomatitis: Secondary | ICD-10-CM

## 2017-09-14 DIAGNOSIS — Z136 Encounter for screening for cardiovascular disorders: Secondary | ICD-10-CM | POA: Diagnosis not present

## 2017-09-14 DIAGNOSIS — Z681 Body mass index (BMI) 19 or less, adult: Secondary | ICD-10-CM

## 2017-09-14 DIAGNOSIS — D508 Other iron deficiency anemias: Secondary | ICD-10-CM

## 2017-09-14 DIAGNOSIS — E441 Mild protein-calorie malnutrition: Secondary | ICD-10-CM

## 2017-09-14 DIAGNOSIS — J449 Chronic obstructive pulmonary disease, unspecified: Secondary | ICD-10-CM

## 2017-09-14 DIAGNOSIS — E559 Vitamin D deficiency, unspecified: Secondary | ICD-10-CM

## 2017-09-14 DIAGNOSIS — D126 Benign neoplasm of colon, unspecified: Secondary | ICD-10-CM

## 2017-09-14 DIAGNOSIS — K922 Gastrointestinal hemorrhage, unspecified: Secondary | ICD-10-CM

## 2017-09-14 DIAGNOSIS — I700 Atherosclerosis of aorta: Secondary | ICD-10-CM

## 2017-09-14 DIAGNOSIS — G459 Transient cerebral ischemic attack, unspecified: Secondary | ICD-10-CM

## 2017-09-14 DIAGNOSIS — I6521 Occlusion and stenosis of right carotid artery: Secondary | ICD-10-CM

## 2017-09-14 DIAGNOSIS — Z0001 Encounter for general adult medical examination with abnormal findings: Secondary | ICD-10-CM

## 2017-09-14 DIAGNOSIS — F325 Major depressive disorder, single episode, in full remission: Secondary | ICD-10-CM

## 2017-09-14 DIAGNOSIS — I10 Essential (primary) hypertension: Secondary | ICD-10-CM | POA: Diagnosis not present

## 2017-09-14 DIAGNOSIS — M810 Age-related osteoporosis without current pathological fracture: Secondary | ICD-10-CM

## 2017-09-14 DIAGNOSIS — J45909 Unspecified asthma, uncomplicated: Secondary | ICD-10-CM

## 2017-09-14 MED ORDER — AZITHROMYCIN 250 MG PO TABS: ORAL_TABLET | ORAL | 1 refills | Status: DC

## 2017-09-14 MED ORDER — NYSTATIN 100000 UNIT/ML MT SUSP: OROMUCOSAL | 0 refills | Status: DC

## 2017-09-14 MED ORDER — PREDNISONE 20 MG PO TABS: ORAL_TABLET | ORAL | 0 refills | Status: DC

## 2017-09-14 NOTE — Patient Instructions (Addendum)
[facility]  7 a.m.-6:30 p.m., Monday 7 a.m.-5 p.m., Tuesday-Friday Schedule an appointment by calling [phone].  WILL GET BONE DENSITY AT SAME TIME  FOLLOW UP EYE DOCTOR  [employer] Free Hearing Test with no obligation # [phone] Do not have to be a member Tues-Sat 10-6  Have had patient's get good cheaper hearing aids from [employer] The air version has good reviews.    Chronic Obstructive Pulmonary Disease Exacerbation Chronic obstructive pulmonary disease (COPD) is a common lung condition in which airflow from the lungs is limited. COPD is a general term that can be used to describe many different lung problems that limit airflow, including chronic bronchitis and emphysema. COPD exacerbations are episodes when breathing symptoms become much worse and require extra treatment. Without treatment, COPD exacerbations can be life threatening, and frequent COPD exacerbations can cause further damage to your lungs. What are the causes?  Respiratory infections.  Exposure to smoke.  Exposure to air pollution, chemical fumes, or dust. Sometimes there is no apparent cause or trigger. What increases the risk?  Smoking cigarettes.  Older age.  Frequent prior COPD exacerbations. What are the signs or symptoms?  Increased coughing.  Increased thick spit (sputum) production.  Increased wheezing.  Increased shortness of breath.  Rapid breathing.  Chest tightness. How is this diagnosed? Your medical history, a physical exam, and tests will help your health care provider make a diagnosis. Tests may include:  A chest X-ray.  Basic lab tests.  Sputum testing.  An arterial blood gas test.  How is this treated? Depending on the severity of your COPD exacerbation, you may need to be admitted to a hospital for treatment. Some of the treatments commonly used to treat COPD exacerbations are:  Antibiotic medicines.  Bronchodilators. These  are drugs that expand the air passages. They may be given with an inhaler or nebulizer. Spacer devices may be needed to help improve drug delivery.  Corticosteroid medicines.  Supplemental oxygen therapy.  Airway clearing techniques, such as noninvasive ventilation (NIV) and positive expiratory pressure (PEP). These provide respiratory support through a mask or other noninvasive device.  Follow these instructions at home:  Do not smoke. Quitting smoking is very important to prevent COPD from getting worse and exacerbations from happening as often.  Avoid exposure to all substances that irritate the airway, especially to tobacco smoke.  If you were prescribed an antibiotic medicine, finish it all even if you start to feel better.  Take all medicines as directed by your health care provider.It is important to use correct technique with inhaled medicines.  Drink enough fluids to keep your urine clear or pale yellow (unless you have a medical condition that requires fluid restriction).  Use a cool mist vaporizer. This makes it easier to clear your chest when you cough.  If you have a home nebulizer and oxygen, continue to use them as directed.  Maintain all necessary vaccinations to prevent infections.  Exercise regularly.  Eat a healthy diet.  Keep all follow-up appointments as directed by your health care provider. Get help right away if:  You have worsening shortness of breath.  You have trouble talking.  You have severe chest pain.  You have blood in your sputum.  You have a fever.  You have weakness, vomit repeatedly, or faint.  You feel confused.  You continue to get worse. This information is not intended to replace advice given to you by your health care provider. Make sure  you discuss any questions you have with your health care provider. Document Released: 08/01/2007 Document Revised: 03/11/2016 Document Reviewed: 06/08/2013 Elsevier Interactive Patient  Education  2017 Elsevier Inc.  Tremor A tremor is trembling or shaking that you cannot control. Most tremors affect the hands or arms. Tremors can also affect the head, vocal cords, face, and other parts of the body. There are many types of tremors. Common types include:  Essential tremor. These usually occur in people over the age of 71. It may run in families and can happen in otherwise healthy people.  Resting tremor. These occur when the muscles are at rest, such as when your hands are resting in your lap. People with Parkinson disease often have resting tremors.  Postural tremor. These occur when you try to hold a pose, such as keeping your hands outstretched.  Kinetic tremor. These occur during purposeful movement, such as trying to touch a finger to your nose.  Task-specific tremor. These may occur when you perform tasks such as handwriting, speaking, or standing.  Psychogenic tremor. These dramatically lessen or disappear when you are distracted. They can happen in people of all ages.  Some types of tremors have no known cause. Tremors can also be a symptom of nervous system problems (neurological disorders) that may occur with aging. Some tremors go away with treatment while others do not. Follow these instructions at home: Watch your tremor for any changes. The following actions may help to lessen any discomfort you are feeling:  Take medicines only as directed by your health care provider.  Limit alcohol intake to no more than 1 drink per day for nonpregnant women and 2 drinks per day for men. One drink equals 12 oz of beer, 5 oz of wine, or 1 oz of hard liquor.  Do not use any tobacco products, including cigarettes, chewing tobacco, or electronic cigarettes. If you need help quitting, ask your health care provider.  Avoid extreme heat or cold.  Limit the amount of caffeine you consumeas directed by your health care provider.  Try to get 8 hours of sleep each  night.  Find ways to manage your stress, such as meditation or yoga.  Keep all follow-up visits as directed by your health care provider. This is important.  Contact a health care provider if:  You start having a tremor after starting a new medicine.  You have tremor with other symptoms such as: ? Numbness. ? Tingling. ? Pain. ? Weakness.  Your tremor gets worse.  Your tremor interferes with your day-to-day life. This information is not intended to replace advice given to you by your health care provider. Make sure you discuss any questions you have with your health care provider. Document Released: 09/24/2002 Document Revised: 06/06/2016 Document Reviewed: 04/01/2014 Elsevier Interactive Patient Education  [employer].

## 2017-09-15 LAB — CBC WITH DIFFERENTIAL/PLATELET
BASOS ABS: 50 {cells}/uL (ref 0–200)
Basophils Relative: 0.9 %
EOS ABS: 22 {cells}/uL (ref 15–500)
EOS PCT: 0.4 %
HEMATOCRIT: 30.4 % — AB (ref 35.0–45.0)
HEMOGLOBIN: 9.7 g/dL — AB (ref 11.7–15.5)
LYMPHS ABS: 1188 {cells}/uL (ref 850–3900)
MCH: 26.6 pg — AB (ref 27.0–33.0)
MCHC: 31.9 g/dL — AB (ref 32.0–36.0)
MCV: 83.5 fL (ref 80.0–100.0)
MPV: 9.9 fL (ref 7.5–12.5)
Monocytes Relative: 9.1 %
NEUTROS ABS: 3740 {cells}/uL (ref 1500–7800)
NEUTROS PCT: 68 %
Platelets: 229 10*3/uL (ref 140–400)
RBC: 3.64 10*6/uL — ABNORMAL LOW (ref 3.80–5.10)
RDW: 16.7 % — ABNORMAL HIGH (ref 11.0–15.0)
Total Lymphocyte: 21.6 %
WBC: 5.5 10*3/uL (ref 3.8–10.8)
WBCMIX: 501 {cells}/uL (ref 200–950)

## 2017-09-15 LAB — URINALYSIS, ROUTINE W REFLEX MICROSCOPIC
BILIRUBIN URINE: NEGATIVE
Bacteria, UA: NONE SEEN /HPF
Glucose, UA: NEGATIVE
Hgb urine dipstick: NEGATIVE
Hyaline Cast: NONE SEEN /LPF
NITRITE: NEGATIVE
Protein, ur: NEGATIVE
RBC / HPF: NONE SEEN /HPF (ref 0–2)
SPECIFIC GRAVITY, URINE: 1.011 (ref 1.001–1.03)

## 2017-09-15 LAB — HEPATIC FUNCTION PANEL
AG RATIO: 1.3 (calc) (ref 1.0–2.5)
ALBUMIN MSPROF: 4.1 g/dL (ref 3.6–5.1)
ALT: 6 U/L (ref 6–29)
AST: 18 U/L (ref 10–35)
Alkaline phosphatase (APISO): 64 U/L (ref 33–130)
BILIRUBIN DIRECT: 0 mg/dL (ref 0.0–0.2)
BILIRUBIN INDIRECT: 0.1 mg/dL — AB (ref 0.2–1.2)
BILIRUBIN TOTAL: 0.1 mg/dL — AB (ref 0.2–1.2)
GLOBULIN: 3.1 g/dL (ref 1.9–3.7)
Total Protein: 7.2 g/dL (ref 6.1–8.1)

## 2017-09-15 LAB — BASIC METABOLIC PANEL WITH GFR
BUN: 17 mg/dL (ref 7–25)
CO2: 14 mmol/L — AB (ref 20–32)
CREATININE: 0.85 mg/dL (ref 0.50–1.05)
Calcium: 8.5 mg/dL — ABNORMAL LOW (ref 8.6–10.4)
Chloride: 107 mmol/L (ref 98–110)
GFR, EST AFRICAN AMERICAN: 87 mL/min/{1.73_m2} (ref >=60)
GFR, EST NON AFRICAN AMERICAN: 75 mL/min/{1.73_m2} (ref >=60)
Glucose, Bld: 94 mg/dL (ref 65–99)
Potassium: 4.2 mmol/L (ref 3.5–5.3)
Sodium: 137 mmol/L (ref 135–146)

## 2017-09-15 LAB — LIPID PANEL
CHOL/HDL RATIO: 5.9 (calc) — AB (ref <5.0)
Cholesterol: 205 mg/dL — ABNORMAL HIGH (ref <200)
HDL: 35 mg/dL — ABNORMAL LOW (ref >50)
LDL CHOLESTEROL (CALC): 135 mg/dL — AB
NON-HDL CHOLESTEROL (CALC): 170 mg/dL — AB (ref <130)
TRIGLYCERIDES: 209 mg/dL — AB (ref <150)

## 2017-09-15 LAB — MICROALBUMIN / CREATININE URINE RATIO
Creatinine, Urine: 24 mg/dL (ref 20–275)
Microalb Creat Ratio: 117 mcg/mg creat — ABNORMAL HIGH (ref <30)
Microalb, Ur: 2.8 mg/dL

## 2017-09-15 LAB — VITAMIN B12: Vitamin B-12: 1115 pg/mL — ABNORMAL HIGH (ref 200–1100)

## 2017-09-15 LAB — IRON, TOTAL/TOTAL IRON BINDING CAP
%SAT: 4 % (calc) — ABNORMAL LOW (ref 11–50)
IRON: 19 ug/dL — AB (ref 45–160)
TIBC: 478 mcg/dL (calc) — ABNORMAL HIGH (ref 250–450)

## 2017-09-15 LAB — TSH: TSH: 1.17 mIU/L (ref 0.40–4.50)

## 2017-09-15 LAB — MAGNESIUM: MAGNESIUM: 1.9 mg/dL (ref 1.5–2.5)

## 2017-09-16 ENCOUNTER — Other Ambulatory Visit: Payer: Self-pay

## 2017-09-16 ENCOUNTER — Emergency Department (HOSPITAL_COMMUNITY): Payer: BLUE CROSS/BLUE SHIELD

## 2017-09-16 ENCOUNTER — Ambulatory Visit: Payer: BLUE CROSS/BLUE SHIELD | Admitting: Physician Assistant

## 2017-09-16 ENCOUNTER — Inpatient Hospital Stay (HOSPITAL_COMMUNITY): Payer: BLUE CROSS/BLUE SHIELD

## 2017-09-16 ENCOUNTER — Encounter (HOSPITAL_COMMUNITY): Payer: Self-pay

## 2017-09-16 ENCOUNTER — Encounter: Payer: Self-pay | Admitting: Physician Assistant

## 2017-09-16 ENCOUNTER — Inpatient Hospital Stay (HOSPITAL_COMMUNITY)
Admission: EM | Admit: 2017-09-16 | Discharge: 2017-09-23 | DRG: 193 | Disposition: A | Discharge status: Home / Self Care | Payer: BLUE CROSS/BLUE SHIELD | Attending: Family Medicine | Admitting: Family Medicine

## 2017-09-16 VITALS — BP 92/60 | HR 106 | Temp 97.3°F | Ht 62.0 in | Wt 106.0 lb

## 2017-09-16 DIAGNOSIS — J9692 Respiratory failure, unspecified with hypercapnia: Secondary | ICD-10-CM | POA: Diagnosis not present

## 2017-09-16 DIAGNOSIS — T380X5A Adverse effect of glucocorticoids and synthetic analogues, initial encounter: Secondary | ICD-10-CM | POA: Diagnosis not present

## 2017-09-16 DIAGNOSIS — R739 Hyperglycemia, unspecified: Secondary | ICD-10-CM | POA: Diagnosis not present

## 2017-09-16 DIAGNOSIS — J449 Chronic obstructive pulmonary disease, unspecified: Secondary | ICD-10-CM | POA: Diagnosis not present

## 2017-09-16 DIAGNOSIS — Z888 Allergy status to other drugs, medicaments and biological substances status: Secondary | ICD-10-CM | POA: Diagnosis not present

## 2017-09-16 DIAGNOSIS — J440 Chronic obstructive pulmonary disease with acute lower respiratory infection: Secondary | ICD-10-CM | POA: Diagnosis not present

## 2017-09-16 DIAGNOSIS — R4182 Altered mental status, unspecified: Secondary | ICD-10-CM | POA: Diagnosis not present

## 2017-09-16 DIAGNOSIS — Z8619 Personal history of other infectious and parasitic diseases: Secondary | ICD-10-CM

## 2017-09-16 DIAGNOSIS — Z7951 Long term (current) use of inhaled steroids: Secondary | ICD-10-CM | POA: Diagnosis not present

## 2017-09-16 DIAGNOSIS — Z886 Allergy status to analgesic agent status: Secondary | ICD-10-CM | POA: Diagnosis not present

## 2017-09-16 DIAGNOSIS — R918 Other nonspecific abnormal finding of lung field: Secondary | ICD-10-CM | POA: Diagnosis not present

## 2017-09-16 DIAGNOSIS — I739 Peripheral vascular disease, unspecified: Secondary | ICD-10-CM | POA: Diagnosis present

## 2017-09-16 DIAGNOSIS — F17210 Nicotine dependence, cigarettes, uncomplicated: Secondary | ICD-10-CM | POA: Diagnosis present

## 2017-09-16 DIAGNOSIS — E785 Hyperlipidemia, unspecified: Secondary | ICD-10-CM | POA: Diagnosis not present

## 2017-09-16 DIAGNOSIS — D509 Iron deficiency anemia, unspecified: Secondary | ICD-10-CM | POA: Diagnosis not present

## 2017-09-16 DIAGNOSIS — R402413 Glasgow coma scale score 13-15, at hospital admission: Secondary | ICD-10-CM | POA: Diagnosis not present

## 2017-09-16 DIAGNOSIS — R0902 Hypoxemia: Secondary | ICD-10-CM

## 2017-09-16 DIAGNOSIS — Z96641 Presence of right artificial hip joint: Secondary | ICD-10-CM | POA: Diagnosis not present

## 2017-09-16 DIAGNOSIS — R531 Weakness: Secondary | ICD-10-CM | POA: Diagnosis not present

## 2017-09-16 DIAGNOSIS — Z87891 Personal history of nicotine dependence: Secondary | ICD-10-CM | POA: Diagnosis not present

## 2017-09-16 DIAGNOSIS — Z7982 Long term (current) use of aspirin: Secondary | ICD-10-CM | POA: Diagnosis not present

## 2017-09-16 DIAGNOSIS — E876 Hypokalemia: Secondary | ICD-10-CM | POA: Diagnosis not present

## 2017-09-16 DIAGNOSIS — D72810 Lymphocytopenia: Secondary | ICD-10-CM | POA: Diagnosis not present

## 2017-09-16 DIAGNOSIS — J9690 Respiratory failure, unspecified, unspecified whether with hypoxia or hypercapnia: Secondary | ICD-10-CM

## 2017-09-16 DIAGNOSIS — J9600 Acute respiratory failure, unspecified whether with hypoxia or hypercapnia: Secondary | ICD-10-CM

## 2017-09-16 DIAGNOSIS — M1990 Unspecified osteoarthritis, unspecified site: Secondary | ICD-10-CM | POA: Diagnosis not present

## 2017-09-16 DIAGNOSIS — Z79899 Other long term (current) drug therapy: Secondary | ICD-10-CM

## 2017-09-16 DIAGNOSIS — R069 Unspecified abnormalities of breathing: Secondary | ICD-10-CM | POA: Diagnosis not present

## 2017-09-16 DIAGNOSIS — J9601 Acute respiratory failure with hypoxia: Secondary | ICD-10-CM | POA: Diagnosis not present

## 2017-09-16 DIAGNOSIS — R0602 Shortness of breath: Secondary | ICD-10-CM | POA: Diagnosis not present

## 2017-09-16 DIAGNOSIS — J189 Pneumonia, unspecified organism: Principal | ICD-10-CM | POA: Diagnosis present

## 2017-09-16 DIAGNOSIS — Z8673 Personal history of transient ischemic attack (TIA), and cerebral infarction without residual deficits: Secondary | ICD-10-CM | POA: Diagnosis not present

## 2017-09-16 DIAGNOSIS — D649 Anemia, unspecified: Secondary | ICD-10-CM | POA: Diagnosis not present

## 2017-09-16 DIAGNOSIS — Z880 Allergy status to penicillin: Secondary | ICD-10-CM | POA: Diagnosis not present

## 2017-09-16 DIAGNOSIS — Z8711 Personal history of peptic ulcer disease: Secondary | ICD-10-CM | POA: Diagnosis not present

## 2017-09-16 DIAGNOSIS — J811 Chronic pulmonary edema: Secondary | ICD-10-CM

## 2017-09-16 DIAGNOSIS — M810 Age-related osteoporosis without current pathological fracture: Secondary | ICD-10-CM | POA: Diagnosis not present

## 2017-09-16 DIAGNOSIS — I361 Nonrheumatic tricuspid (valve) insufficiency: Secondary | ICD-10-CM | POA: Diagnosis not present

## 2017-09-16 LAB — CBC WITH DIFFERENTIAL/PLATELET
BASOS ABS: 0 10*3/uL (ref 0.0–0.1)
BASOS PCT: 0 %
Eosinophils Absolute: 0 10*3/uL (ref 0.0–0.7)
Eosinophils Relative: 0 %
HEMATOCRIT: 30.7 % — AB (ref 36.0–46.0)
Hemoglobin: 9.8 g/dL — ABNORMAL LOW (ref 12.0–15.0)
LYMPHS PCT: 7 %
Lymphs Abs: 0.7 10*3/uL (ref 0.7–4.0)
MCH: 26.5 pg (ref 26.0–34.0)
MCHC: 31.9 g/dL (ref 30.0–36.0)
MCV: 83 fL (ref 78.0–100.0)
Monocytes Absolute: 0.5 10*3/uL (ref 0.1–1.0)
Monocytes Relative: 4 %
NEUTROS ABS: 9.3 10*3/uL — AB (ref 1.7–7.7)
Neutrophils Relative %: 89 %
PLATELETS: 208 10*3/uL (ref 150–400)
RBC: 3.7 MIL/uL — AB (ref 3.87–5.11)
RDW: 19.2 % — AB (ref 11.5–15.5)
WBC: 10.5 10*3/uL (ref 4.0–10.5)

## 2017-09-16 LAB — I-STAT ARTERIAL BLOOD GAS, ED
Acid-base deficit: 9 mmol/L — ABNORMAL HIGH (ref 0.0–2.0)
BICARBONATE: 16.7 mmol/L — AB (ref 20.0–28.0)
O2 SAT: 99 %
PCO2 ART: 34.9 mmHg (ref 32.0–48.0)
Patient temperature: 98.5
TCO2: 18 mmol/L — AB (ref 22–32)
pH, Arterial: 7.287 — ABNORMAL LOW (ref 7.350–7.450)
pO2, Arterial: 169 mmHg — ABNORMAL HIGH (ref 83.0–108.0)

## 2017-09-16 LAB — D-DIMER, QUANTITATIVE: D-Dimer, Quant: 0.63 ug/mL-FEU — ABNORMAL HIGH (ref 0.00–0.50)

## 2017-09-16 LAB — I-STAT CG4 LACTIC ACID, ED
Lactic Acid, Venous: 1.35 mmol/L (ref 0.5–1.9)
Lactic Acid, Venous: 0.64 mmol/L (ref 0.5–1.9)

## 2017-09-16 LAB — URINALYSIS, ROUTINE W REFLEX MICROSCOPIC
BILIRUBIN URINE: NEGATIVE
GLUCOSE, UA: NEGATIVE mg/dL
KETONES UR: 5 mg/dL — AB
NITRITE: NEGATIVE
PH: 5 (ref 5.0–8.0)
PROTEIN: NEGATIVE mg/dL
Specific Gravity, Urine: 1.029 (ref 1.005–1.030)

## 2017-09-16 LAB — I-STAT TROPONIN, ED: Troponin i, poc: 0 ng/mL (ref 0.00–0.08)

## 2017-09-16 LAB — COMPREHENSIVE METABOLIC PANEL
ALK PHOS: 89 U/L (ref 38–126)
ALT: 10 U/L — ABNORMAL LOW (ref 14–54)
ANION GAP: 10 (ref 5–15)
AST: 37 U/L (ref 15–41)
Albumin: 3 g/dL — ABNORMAL LOW (ref 3.5–5.0)
BILIRUBIN TOTAL: 0.3 mg/dL (ref 0.3–1.2)
BUN: 18 mg/dL (ref 6–20)
CALCIUM: 8.2 mg/dL — AB (ref 8.9–10.3)
CO2: 19 mmol/L — ABNORMAL LOW (ref 22–32)
Chloride: 107 mmol/L (ref 101–111)
Creatinine, Ser: 0.68 mg/dL (ref 0.44–1.00)
Glucose, Bld: 87 mg/dL (ref 65–99)
POTASSIUM: 3.9 mmol/L (ref 3.5–5.1)
Sodium: 136 mmol/L (ref 135–145)
TOTAL PROTEIN: 6.5 g/dL (ref 6.5–8.1)

## 2017-09-16 LAB — CBG MONITORING, ED: GLUCOSE-CAPILLARY: 111 mg/dL — AB (ref 65–99)

## 2017-09-16 LAB — STREP PNEUMONIAE URINARY ANTIGEN: Strep Pneumo Urinary Antigen: NEGATIVE

## 2017-09-16 LAB — ACETAMINOPHEN LEVEL: Acetaminophen (Tylenol), Serum: <10 ug/mL — ABNORMAL LOW (ref 10–30)

## 2017-09-16 LAB — SALICYLATE LEVEL: SALICYLATE LVL: 24.3 mg/dL (ref 2.8–30.0)

## 2017-09-16 LAB — BRAIN NATRIURETIC PEPTIDE: B NATRIURETIC PEPTIDE 5: 258.1 pg/mL — AB (ref 0.0–100.0)

## 2017-09-16 MED ORDER — ALBUTEROL SULFATE (2.5 MG/3ML) 0.083% IN NEBU: 3.0000 mL | INHALATION_SOLUTION | Freq: Four times a day (QID) | RESPIRATORY_TRACT | Status: DC | PRN

## 2017-09-16 MED ORDER — FLUTICASONE FUROATE-VILANTEROL 200-25 MCG/INH IN AEPB
1.0000 | INHALATION_SPRAY | Freq: Every day | RESPIRATORY_TRACT | Status: DC
  Administered 2017-09-19: 08:00:00 1 via RESPIRATORY_TRACT
  Filled 2017-09-16 (×2): qty 28

## 2017-09-16 MED ORDER — DEXTROSE 5 % IV SOLN
500.0000 mg | INTRAVENOUS | Status: DC
  Administered 2017-09-17 – 2017-09-21 (×5): 500 mg via INTRAVENOUS
  Filled 2017-09-16 (×6): qty 500

## 2017-09-16 MED ORDER — METHYLPREDNISOLONE SODIUM SUCC 125 MG IJ SOLR
125.0000 mg | Freq: Once | INTRAMUSCULAR | Status: AC
  Administered 2017-09-16: 125 mg via INTRAVENOUS
  Filled 2017-09-16: qty 2

## 2017-09-16 MED ORDER — IOPAMIDOL (ISOVUE-370) INJECTION 76%
INTRAVENOUS | Status: AC
  Administered 2017-09-16: 80 mL
  Filled 2017-09-16: qty 100

## 2017-09-16 MED ORDER — ACETAMINOPHEN 325 MG PO TABS
650.0000 mg | ORAL_TABLET | Freq: Four times a day (QID) | ORAL | Status: DC | PRN
  Administered 2017-09-21 – 2017-09-22 (×4): 650 mg via ORAL
  Filled 2017-09-16 (×4): qty 2

## 2017-09-16 MED ORDER — SODIUM CHLORIDE 0.9 % IV BOLUS (SEPSIS)
1000.0000 mL | Freq: Once | INTRAVENOUS | Status: AC
  Administered 2017-09-16: 1000 mL via INTRAVENOUS

## 2017-09-16 MED ORDER — IPRATROPIUM-ALBUTEROL 0.5-2.5 (3) MG/3ML IN SOLN
3.0000 mL | Freq: Once | RESPIRATORY_TRACT | Status: AC
  Administered 2017-09-16: 3 mL via RESPIRATORY_TRACT
  Filled 2017-09-16: qty 3

## 2017-09-16 MED ORDER — SODIUM CHLORIDE 0.9 % IV BOLUS (SEPSIS): 500.0000 mL | Freq: Once | INTRAVENOUS | Status: DC

## 2017-09-16 MED ORDER — DEXTROSE 5 % IV SOLN: 500.0000 mg | INTRAVENOUS | Status: DC

## 2017-09-16 MED ORDER — EZETIMIBE 10 MG PO TABS
10.0000 mg | ORAL_TABLET | Freq: Every day | ORAL | Status: DC
  Administered 2017-09-18 – 2017-09-23 (×6): 10 mg via ORAL
  Filled 2017-09-16 (×8): qty 1

## 2017-09-16 MED ORDER — DEXTROSE 5 % IV SOLN
1.0000 g | INTRAVENOUS | Status: DC
  Administered 2017-09-17 – 2017-09-22 (×6): 1 g via INTRAVENOUS
  Filled 2017-09-16 (×6): qty 10

## 2017-09-16 MED ORDER — SODIUM CHLORIDE 0.9 % IV BOLUS (SEPSIS)
500.0000 mL | Freq: Once | INTRAVENOUS | Status: AC
  Administered 2017-09-16: 500 mL via INTRAVENOUS

## 2017-09-16 MED ORDER — SODIUM CHLORIDE 0.9 % IV SOLN
INTRAVENOUS | Status: DC
  Administered 2017-09-16: 17:00:00 via INTRAVENOUS

## 2017-09-16 MED ORDER — DOCUSATE SODIUM 100 MG PO CAPS
100.0000 mg | ORAL_CAPSULE | Freq: Two times a day (BID) | ORAL | Status: DC
Start: 2017-09-16 — End: 2017-09-23
  Administered 2017-09-18 – 2017-09-23 (×7): 100 mg via ORAL
  Filled 2017-09-16 (×12): qty 1

## 2017-09-16 MED ORDER — ASPIRIN EC 81 MG PO TBEC
81.0000 mg | DELAYED_RELEASE_TABLET | Freq: Every day | ORAL | Status: DC
  Administered 2017-09-18 – 2017-09-23 (×6): 81 mg via ORAL
  Filled 2017-09-16 (×7): qty 1

## 2017-09-16 MED ORDER — DEXTROSE 5 % IV SOLN
500.0000 mg | Freq: Once | INTRAVENOUS | Status: AC
  Administered 2017-09-16: 500 mg via INTRAVENOUS
  Filled 2017-09-16: qty 500

## 2017-09-16 MED ORDER — ACETAMINOPHEN 650 MG RE SUPP
650.0000 mg | Freq: Four times a day (QID) | RECTAL | Status: DC | PRN
  Administered 2017-09-17 – 2017-09-18 (×2): 650 mg via RECTAL
  Filled 2017-09-16 (×3): qty 1

## 2017-09-16 MED ORDER — ENOXAPARIN SODIUM 30 MG/0.3ML ~~LOC~~ SOLN
30.0000 mg | SUBCUTANEOUS | Status: DC
  Administered 2017-09-17 – 2017-09-22 (×7): 30 mg via SUBCUTANEOUS
  Filled 2017-09-16 (×7): qty 0.3

## 2017-09-16 MED ORDER — DEXTROSE 5 % IV SOLN
1.0000 g | Freq: Once | INTRAVENOUS | Status: AC
  Administered 2017-09-16: 1 g via INTRAVENOUS
  Filled 2017-09-16: qty 10
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
## 2017-09-16 NOTE — ED Provider Notes (Addendum)
[facility] EMERGENCY DEPARTMENT Provider Note   CSN: [id number] Arrival date & time: 09/16/17  1115     History   Chief Complaint Chief Complaint  Patient presents with  . Shortness of Breath    HPI [name] is a 61 y.o. female.  HPI   61 year old female with a history of COPD, hyperlipidemia, duodenal ulcer, CVA, peripheral vascular disease, smoking, presents with concern for shortness of breath cough and subjective fevers.  Reports shortness of breath over the last 3 weeks, with nonproductive cough beginning about a week ago.  Reports she saw her primary care physician on Wednesday, was given steroids, antibiotics, and inhalers.  Reports her breathing would improve with the inhalers.  Reports she has significantly worsened beginning last night.  Daughter reports this morning she appeared to be delirious, confused.  Reports that she had felt like she was warm, like she had a fever of approximately 101, however did not have a thermometer to check her temperature.  Denies chest pain.  Reports severe fatigue.  Daughter reports she has had shortness of breath when she is laid down flat, and as well as severe shortness of breath with ambulation.  Denies swelling in her legs.  She was going to see her primary care physician today for a recheck of her COPD exacerbation, as well as have an evaluation of her hemoglobin which was decreased on last check in comparison to prior.  Patient denies any black or bloody stools.  Reports last GI bleed was about 1 year ago.  Past Medical History:  Diagnosis Date  . Allergy   . Anemia   . Arthritis   . Asthma   . Blood transfusion without reported diagnosis   . Cataract   . COPD (chronic obstructive pulmonary disease) ([place])   . Duodenal ulcer   . Emphysema of lung ([place])   . Family history of malignant neoplasm of gastrointestinal tract   . GERD (gastroesophageal reflux disease)   . Hepatitis    had in 6th grade pt unsure  which kind  . Hiatal hernia   . HTN (hypertension) 10/03/2014  . Hx of colonic polyps   . Hyperlipemia   . Internal hemorrhoids   . Osteoporosis   . Peripheral vascular disease ([place])   . Pneumonia    hx of  . Shortness of breath dyspnea    with ambulation  . Stroke ([place])   . Vitamin D deficiency     Patient Active Problem List   Diagnosis Date Noted  . Thoracic aorta atherosclerosis ([place]) 11/30/2016  . TIA (transient ischemic attack) 08/19/2016  . Iron deficiency anemia 07/20/2016  . Mild malnutrition ([place]) 06/23/2016  . Osteoporosis 01/02/2015  . Depression, major, in remission ([place]) 01/02/2015  . Carotid stenosis, asymptomatic 12/16/2014  . HTN (hypertension) 10/03/2014  . Medication management 10/03/2014  . COPD (chronic obstructive pulmonary disease) ([place])   . Asthma   . Vitamin D deficiency   . COLONIC POLYPS 04/30/2008  . Mixed hyperlipidemia 04/30/2008  . GERD 04/30/2008  . Upper GI bleed 04/30/2008    Past Surgical History:  Procedure Laterality Date  . ABDOMINAL HYSTERECTOMY    . APPENDECTOMY    . CATARACT EXTRACTION, BILATERAL    . COLONOSCOPY    . ELBOW SURGERY Right   . ENDARTERECTOMY Right 12/16/2014   Procedure: ENDARTERECTOMY CAROTID with Dacron patch;  Surgeon: Rosetta Posner, MD;  Location: [facility];  Service: Vascular;  Laterality: Right;  . ESOPHAGOGASTRODUODENOSCOPY  10/2008  .  HAND SURGERY Left   . LUMBAR DISC SURGERY    . TOTAL HIP ARTHROPLASTY Right   . trigeminal neuralgia surgery    . UPPER GASTROINTESTINAL ENDOSCOPY    . WRIST SURGERY Left     OB History    No data available       Home Medications    Prior to Admission medications   Medication Sig Start Date End Date Taking? Authorizing Provider  albuterol (PROVENTIL HFA;VENTOLIN HFA) 108 (90 Base) MCG/ACT inhaler Inhale 2 puffs into the lungs every 6 (six) hours as needed for wheezing or shortness of breath. 08/16/17  Yes Vicie Mutters, PA-C  aspirin EC 81 MG tablet Take 1 tablet  (81 mg total) by mouth daily. 12/16/14  Yes Alvia Grove, PA-C  azithromycin (ZITHROMAX) 250 MG tablet Take 2 tablets (500 mg) on  Day 1,  followed by 1 tablet (250 mg) once daily on Days 2 through 5. 09/14/17 09/19/17 Yes Vicie Mutters, PA-C  ezetimibe (ZETIA) 10 MG tablet Take 1 tablet (10 mg total) by mouth daily. 07/09/15  Yes Vicie Mutters, PA-C  FeFum-FePoly-FA-B Cmp-C-Biot (INTEGRA PLUS) CAPS Take 1 capsule by mouth daily. 12/10/15  Yes Esterwood, Amy S, PA-C  Fluticasone-Salmeterol (ADVAIR DISKUS) 250-50 MCG/DOSE AEPB Inhale 1 puff into the lungs every 12 (twelve) hours.   Yes [provider]  nystatin (MYCOSTATIN) 100000 UNIT/ML suspension 5 ml four times a day, retain in mouth as long as possible (Swish and Spit).  Use for 48 hours after symptoms resolve. Patient taking differently: Use as directed 5 mLs in the mouth or throat 4 (four) times daily. 5 ml four times a day, retain in mouth as long as possible (Swish and Spit).  Use for 48 hours after symptoms resolve. 09/14/17  Yes Vicie Mutters, PA-C  omeprazole (PRILOSEC) 20 MG capsule TAKE 1 CAPSULE BY MOUTH TWICE A DAY Patient taking differently: TAKE 20mg  CAPSULE BY MOUTH TWICE A DAY 07/23/17  Yes Unk Pinto, MD  predniSONE (DELTASONE) 20 MG tablet 2 tablets daily for 3 days, 1 tablet daily for 4 days. 09/14/17  Yes Vicie Mutters, PA-C  omeprazole (PRILOSEC) 20 MG capsule TAKE 1 CAPSULE BY MOUTH TWICE A DAY Patient not taking: Reported on 09/16/2017 08/19/17   Vicie Mutters, PA-C    Family History Family History  Problem Relation Age of Onset  . Hypertension Father   . Hyperlipidemia Father   . Heart disease Father   . Heart attack Father   . Colon polyps Mother   . Inflammatory bowel disease Mother   . Hypertension Mother   . Stroke Mother   . Heart disease Mother   . Hyperlipidemia Mother   . Varicose Veins Mother   . Heart attack Mother   . AAA (abdominal aortic aneurysm) Mother   . Diabetes  Maternal Grandfather   . Heart disease Maternal Grandfather   . Heart disease Maternal Grandmother   . Colon cancer Unknown        mat great aunt  . Heart disease Brother   . Hyperlipidemia Brother   . Hypertension Brother   . Heart attack Brother   . AAA (abdominal aortic aneurysm) Brother   . Varicose Veins Daughter   . Esophageal cancer Neg Hx   . Stomach cancer Neg Hx   . Rectal cancer Neg Hx     Social History Social History   Tobacco Use  . Smoking status: Current Every Day Smoker    Packs/day: 0.25    Years: 36.00  Pack years: 9.00    Types: Cigarettes    Last attempt to quit: 06/18/2014    Years since quitting: 3.2  . Smokeless tobacco: Never Used  . Tobacco comment: 1-2 sometimes not daily  Substance Use Topics  . Alcohol use: Yes    Alcohol/week: 0.0 oz    Comment: occasional  . Drug use: No     Allergies   Celebrex [celecoxib]; Crestor [rosuvastatin]; Penicillins; and Pravastatin   Review of Systems Review of Systems  Constitutional: Positive for fatigue and fever.  HENT: Negative for sore throat.   Eyes: Negative for visual disturbance.  Respiratory: Positive for cough, shortness of breath and wheezing.   Cardiovascular: Negative for chest pain and leg swelling.  Gastrointestinal: Negative for abdominal pain, nausea and vomiting.  Genitourinary: Positive for flank pain (right flank). Negative for difficulty urinating.  Musculoskeletal: Negative for back pain and neck pain.  Skin: Negative for rash.  Neurological: Positive for light-headedness. Negative for syncope and headaches.     Physical Exam Updated Vital Signs BP (!) 108/56   Pulse 87   Temp 99.5 F (37.5 C) (Rectal)   Resp 15   Ht 5\' 1"  (1.549 m)   Wt 48.1 kg (106 lb)   SpO2 92%   BMI 20.03 kg/m   Physical Exam  Constitutional: She is oriented to person, place, and time. She appears well-developed. She appears cachectic. She appears toxic. She appears ill. No distress.  HENT:    Head: Normocephalic and atraumatic.  Eyes: Conjunctivae and EOM are normal.  Neck: Normal range of motion. No JVD present.  Cardiovascular: Normal rate, regular rhythm, normal heart sounds and intact distal pulses. Exam reveals no gallop and no friction rub.  No murmur heard. Pulmonary/Chest: Tachypnea noted. No respiratory distress. She has no wheezes. She has rhonchi (diffuse). She has no rales.  Abdominal: Soft. She exhibits no distension. There is no tenderness. There is no guarding.  Musculoskeletal: She exhibits no edema or tenderness.  Neurological: She is alert and oriented to person, place, and time.  Skin: Skin is warm and dry. No rash noted. She is not diaphoretic. No erythema.  Nursing note and vitals reviewed.    ED Treatments / Results  Labs (all labs ordered are listed, but only abnormal results are displayed) Labs Reviewed  COMPREHENSIVE METABOLIC PANEL - Abnormal; Notable for the following components:      Result Value   CO2 19 (*)    Calcium 8.2 (*)    Albumin 3.0 (*)    ALT 10 (*)    All other components within normal limits  CBC WITH DIFFERENTIAL/PLATELET - Abnormal; Notable for the following components:   RBC 3.70 (*)    Hemoglobin 9.8 (*)    HCT 30.7 (*)    RDW 19.2 (*)    Neutro Abs 9.3 (*)    All other components within normal limits  BRAIN NATRIURETIC PEPTIDE - Abnormal; Notable for the following components:   B Natriuretic Peptide 258.1 (*)    All other components within normal limits  D-DIMER, QUANTITATIVE (NOT AT [facility]) - Abnormal; Notable for the following components:   D-Dimer, Quant 0.63 (*)    All other components within normal limits  I-STAT ARTERIAL BLOOD GAS, ED - Abnormal; Notable for the following components:   pH, Arterial 7.287 (*)    pO2, Arterial 169.0 (*)    Bicarbonate 16.7 (*)    TCO2 18 (*)    Acid-base deficit 9.0 (*)    All other  components within normal limits  CULTURE, BLOOD (ROUTINE X 2)  CULTURE, BLOOD (ROUTINE X 2)   URINE CULTURE  URINALYSIS, ROUTINE W REFLEX MICROSCOPIC  I-STAT CG4 LACTIC ACID, ED  I-STAT TROPONIN, ED  I-STAT CG4 LACTIC ACID, ED    EKG  EKG Interpretation None       Radiology Ct Angio Chest Pe W And/or Wo Contrast  Result Date: 09/16/2017 CLINICAL DATA:  Hypoxic, shortness of breath, altered mental status, can't breathe, history end-stage COPD, hypertension, smoker EXAM: CT ANGIOGRAPHY CHEST WITH CONTRAST TECHNIQUE: Multidetector CT imaging of the chest was performed using the standard protocol during bolus administration of intravenous contrast. Multiplanar CT image reconstructions and MIPs were obtained to evaluate the vascular anatomy. CONTRAST:  77mL ISOVUE-370 IOPAMIDOL (ISOVUE-370) INJECTION 76% IV COMPARISON:  09/02/2016 FINDINGS: Cardiovascular: Aorta normal caliber without aneurysm or dissection. No pericardial effusion. Pulmonary arteries well opacified and patent. No evidence of pulmonary embolism. Mediastinum/Nodes: Base of cervical region normal appearance. Esophagus unremarkable. No definite thoracic adenopathy. Lungs/Pleura: Diffuse BILATERAL pulmonary infiltrates, mixed interstitial and alveolar, with predominance in the mid to upper lungs. These may represent diffuse pulmonary edema or infection. Unable to exclude small pulmonary nodules in the setting of diffuse infiltrates. No pleural effusion or pneumothorax. Upper Abdomen: At reflux of contrast into IVC and hepatic veins question elevated RIGHT heart pressures. Remaining visualized upper abdomen unremarkable. Musculoskeletal: No acute osseous findings. Review of the MIP images confirms the above findings. IMPRESSION: Diffuse BILATERAL pulmonary infiltrates with predominance in the mid upper lungs question pulmonary edema or infection. No evidence of pulmonary embolism. Electronically Signed   By: Lavonia Dana M.D.   On: 09/16/2017 15:13   Dg Chest Port 1 View  Result Date: 09/16/2017 CLINICAL DATA:  Increasing  shortness of breath. History of asthma, COPD, hypertension and pneumonia. EXAM: PORTABLE CHEST 1 VIEW COMPARISON:  Radiographs 11/22/2014.  CT 09/02/2016. FINDINGS: 1138 hours. The heart size and mediastinal contours are stable. There are new diffuse perihilar airspace opacities, greater on the left. There is no pleural effusion or pneumothorax. The bones and soft tissues appear unremarkable. Telemetry leads overlie the chest. IMPRESSION: New bilateral perihilar airspace opacities, most consistent with acute pulmonary edema. Atypical infection considered less likely. Electronically Signed   By: Richardean Sale M.D.   On: 09/16/2017 12:00    Procedures .Critical Care Performed by: Gareth Morgan, MD Authorized by: Gareth Morgan, MD   Comments:     CRITICAL CARE: acute respiratory failure, sepsis receiving multiple abx, copd, hypoxia requiring bipap Performed by: Tennis Must   Total critical care time: 40 minutes  Critical care time was exclusive of separately billable procedures and treating other patients.  Critical care was necessary to treat or prevent imminent or life-threatening deterioration.  Critical care was time spent personally by me on the following activities: development of treatment plan with patient and/or surrogate as well as nursing, discussions with consultants, evaluation of patient's response to treatment, examination of patient, obtaining history from patient or surrogate, ordering and performing treatments and interventions, ordering and review of laboratory studies, ordering and review of radiographic studies, pulse oximetry and re-evaluation of patient's condition.    (including critical care time)  Medications Ordered in ED Medications  azithromycin (ZITHROMAX) 500 mg in dextrose 5 % 250 mL IVPB (not administered)  cefTRIAXone (ROCEPHIN) 1 g in dextrose 5 % 50 mL IVPB (not administered)  sodium chloride 0.9 % bolus 1,000 mL (0 mLs Intravenous Stopped  09/16/17 1244)    And  sodium chloride 0.9 % bolus 500 mL (0 mLs Intravenous Stopped 09/16/17 1341)  cefTRIAXone (ROCEPHIN) 1 g in dextrose 5 % 50 mL IVPB (0 g Intravenous Stopped 09/16/17 1257)  azithromycin (ZITHROMAX) 500 mg in dextrose 5 % 250 mL IVPB (0 mg Intravenous Stopped 09/16/17 1350)  ipratropium-albuterol (DUONEB) 0.5-2.5 (3) MG/3ML nebulizer solution 3 mL (3 mLs Nebulization Given 09/16/17 1158)  methylPREDNISolone sodium succinate (SOLU-MEDROL) 125 mg/2 mL injection 125 mg (125 mg Intravenous Given 09/16/17 1158)  iopamidol (ISOVUE-370) 76 % injection (80 mLs  Contrast Given 09/16/17 1445)     Initial Impression / Assessment and Plan / ED Course  I have reviewed the triage vital signs and the nursing notes.  Pertinent labs & imaging results that were available during my care of the patient were reviewed by me and considered in my medical decision making (see chart for details).    61 year old female with a history of COPD, hyperlipidemia, duodenal ulcer, CVA, peripheral vascular disease, smoking, presents with concern for shortness of breath, cough, and subjective fevers.  Patient had started treatment for COPD exacerbation with steroids, antibiotics, and breathing treatments with her primary care physician on Wednesday, however significantly worsened over the last day, significant shortness of breath, fatigue and delirium per daughter and when she went for recheck today was found to have Sp O2 in the 70s.  On arrival to the emergency department, patient noted to have hypoxia, and was placed on nonrebreather with improvement of her oxygenation.  Patient initially mentating well, with diffuse rhonchi on exam.  History and physical exam most consistent with likely pneumonia.  Blood pressure is 01X systolic on arrival, from baseline of 110-120s per her daughter.  Patient given 30 cc/kg of normal saline or 1500 cc and Rocephin and azithromycin on arrival with concern for possible sepsis  secondary to community-acquired pneumonia.  In addition, she was given Solu-Medrol, and DuoNeb's for possible COPD as contributor, however overall exam less consistent with this.  X-ray shows pulmonary edema, or possible multifocal pneumonia.  BNP is in 200s.  She has no peripheral edema, history overall sounds more consistent with pneumonia, however patient noted to have temp 99.5 here without leukocytosis and CHF is possibility.  DDimer positive and CT scan done again showing no PE and diffuse infiltrates edema versus CHF.   Blood pressures improved following initial 1.5L fluid administration, however patient's mental status worsened. Patient sleepier, however would wake with stimulation, requesting food. O2 low 90s on nonrebreather. Blood gas shows no significant CO2 retention.  Patient alert enough for BiPAP, protecting her airway, and this was initiated given underlying COPD, concern for possible fluid overload, avoidance of intubation.  Patient tolerating BiPAP at this time, is speaking but difficult to understand, maintaining O2 saturation on 40% FiO2.  Is fatigued but is not significantly tiring to require intubation at this time.  She is stable for admission to [facility] unit.  Lactic acid WNL and improving.      Final Clinical Impressions(s) / ED Diagnoses   Final diagnoses:  Community acquired pneumonia, unspecified laterality  Acute respiratory failure with hypoxia [facility])    ED Discharge Orders    None        Gareth Morgan, MD 09/16/17 1916

## 2017-09-16 NOTE — Progress Notes (Signed)
Pt transported from ED E48 to CT2 on ventilator. Pt stable throughout with no complications. VS within normal limits. RT will continue to monitor

## 2017-09-16 NOTE — Progress Notes (Signed)
Chaplain was paged to ED for purpose of completing [place]. Patient was declining as she was in Emergency Dept. After completing the AD, had prayer with patient and her daughter. AD was made, witnessed and notarized, copy to Financial controller and original and 2 copies to patient and daughter. Conard Novak, Chaplain     09/16/17 1400  Clinical Encounter Type  Visited With Patient and family together  Visit Type Initial;Spiritual support;Other (Comment) (Complete AD forms)  Referral From Nurse  Consult/Referral To Chaplain  Spiritual Encounters  Spiritual Needs Prayer;Emotional  Stress Factors  Patient Stress Factors Exhausted;Health changes  Family Stress Factors Other (Comment) (daugther very concerned over mom and getting AD done)  Advance Directives (For Healthcare)  Does Patient Have a Medical Advance Directive? Yes  Type of Advance Directive [place] in Chart? Yes

## 2017-09-16 NOTE — ED Triage Notes (Signed)
Pt brought in by [facility] for SOB and AMS per pt daughter. Pt was seen this morning at [facility] for a follow of a recent GI Bleed. Pt found to be hypoxic in the 50-60% range, facility sent for her to be evaluated. Per EMS pt had recent COPD exacerbation as well. Pt is currently A+Ox4. Pt states "I just can't breathe". Pt does not wear O2 at home and states her sats are normally 97% on RA. Pt currently 73% on RA, placed on 4L. O2 came up to 82%, placed on 6L, O2 now 87%, placed on nonrebreather, O2 now 94%.

## 2017-09-16 NOTE — ED Notes (Signed)
Transported pt to CT w/ RT

## 2017-09-16 NOTE — ED Notes (Signed)
RN transported pt to CT 

## 2017-09-16 NOTE — Progress Notes (Signed)
Pharmacy Antibiotic Note  [name] is a 61 y.o. female admitted on 09/16/2017 with pneumonia.  Pharmacy has been consulted for ceftriaxone/azithromycin dosing. Afeb, WBC WNL. Spoke with Dr. Billy Fischer - history of "whelps" reaction to PCN noted, no history of receiving beta lactams in Epic, she is ok with using ceftriaxone in this patient.  Plan: Ceftriaxone 1g IV q24h Azithromycin 500mg  IV q24h F/u clinical progress, LOT Not renally adjusted - Rx will sign off consults  Height: 5\' 1"  (154.9 cm) Weight: 106 lb (48.1 kg) IBW/kg (Calculated) : 47.8  Temp (24hrs), Avg:97.9 F (36.6 C), Min:97.3 F (36.3 C), Max:98.4 F (36.9 C)  Recent Labs  Lab 09/14/17 1056 09/16/17 1156  WBC 5.5  --   CREATININE 0.85  --   LATICACIDVEN  --  1.35    Estimated Creatinine Clearance: 53.8 mL/min (by C-G formula based on SCr of 0.85 mg/dL).    Allergies  Allergen Reactions  . Celebrex [Celecoxib] Other (See Comments)    GI bleed  . Crestor [Rosuvastatin] Other (See Comments)    Made legs hurt  . Penicillins Other (See Comments)    Has patient had a PCN reaction causing immediate rash, facial/tongue/throat swelling, SOB or lightheadedness with hypotension: Yes Has patient had a PCN reaction causing severe rash involving mucus membranes or skin necrosis: No Has patient had a PCN reaction that required hospitalization: No Has patient had a PCN reaction occurring within the last 10 years: Yes If all of the above answers are "NO", then may proceed with Cephalosporin use.   REACTION: whelps  . Pravastatin Other (See Comments)    Muscle aches     Elicia Lamp, PharmD, BCPS Clinical Pharmacist 09/16/2017 12:02 PM
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
## 2017-09-16 NOTE — Progress Notes (Deleted)
Pharmacy Antibiotic Note  [name] is a 61 y.o. female admitted on 09/16/2017 with CAP.  Pharmacy has been consulted for Ceftriaxone dosing.  Febrile 99.5degF, WBC 10.5, LA 0.64   Plan: Ceftriaxone 1G IV Q24H   F/U clinical status, C&S, LOT   Height: 5\' 1"  (154.9 cm) Weight: 106 lb (48.1 kg) IBW/kg (Calculated) : 47.8  Temp (24hrs), Avg:98.4 F (36.9 C), Min:97.3 F (36.3 C), Max:99.5 F (37.5 C)  Recent Labs  Lab 09/14/17 1056 09/16/17 1135 09/16/17 1156 09/16/17 1415  WBC 5.5 10.5  --   --   CREATININE 0.85 0.68  --   --   LATICACIDVEN  --   --  1.35 0.64    Estimated Creatinine Clearance: 57.1 mL/min (by C-G formula based on SCr of 0.68 mg/dL).    Allergies  Allergen Reactions  . Celebrex [Celecoxib] Other (See Comments)    GI bleed  . Crestor [Rosuvastatin] Other (See Comments)    Made legs hurt  . Penicillins Other (See Comments)    Has patient had a PCN reaction causing immediate rash, facial/tongue/throat swelling, SOB or lightheadedness with hypotension: Yes Has patient had a PCN reaction causing severe rash involving mucus membranes or skin necrosis: No Has patient had a PCN reaction that required hospitalization: No Has patient had a PCN reaction occurring within the last 10 years: Yes If all of the above answers are "NO", then may proceed with Cephalosporin use.   REACTION: whelps  . Pravastatin Other (See Comments)    Muscle aches    Antimicrobials this admission: CTX 11/30>>  Dose adjustments this admission: N/A  Microbiology results: 11/30 BCx:   Thank you for allowing pharmacy to be a part of this patient's care.  Felicity Pellegrini  PharmD Candidate '19 08/22/2017 3:30 PM

## 2017-09-17 ENCOUNTER — Inpatient Hospital Stay (HOSPITAL_COMMUNITY): Payer: BLUE CROSS/BLUE SHIELD

## 2017-09-17 ENCOUNTER — Encounter (HOSPITAL_COMMUNITY): Payer: Self-pay | Admitting: *Deleted

## 2017-09-17 DIAGNOSIS — J449 Chronic obstructive pulmonary disease, unspecified: Secondary | ICD-10-CM

## 2017-09-17 DIAGNOSIS — D509 Iron deficiency anemia, unspecified: Secondary | ICD-10-CM

## 2017-09-17 DIAGNOSIS — I361 Nonrheumatic tricuspid (valve) insufficiency: Secondary | ICD-10-CM

## 2017-09-17 DIAGNOSIS — J9601 Acute respiratory failure with hypoxia: Secondary | ICD-10-CM

## 2017-09-17 LAB — COMPREHENSIVE METABOLIC PANEL
ALBUMIN: 2.5 g/dL — AB (ref 3.5–5.0)
ALT: 10 U/L — AB (ref 14–54)
AST: 36 U/L (ref 15–41)
Alkaline Phosphatase: 137 U/L — ABNORMAL HIGH (ref 38–126)
Anion gap: 7 (ref 5–15)
BUN: 16 mg/dL (ref 6–20)
CHLORIDE: 112 mmol/L — AB (ref 101–111)
CO2: 20 mmol/L — AB (ref 22–32)
Calcium: 7.7 mg/dL — ABNORMAL LOW (ref 8.9–10.3)
Creatinine, Ser: 0.47 mg/dL (ref 0.44–1.00)
GFR calc Af Amer: >60 mL/min (ref >60)
GLUCOSE: 93 mg/dL (ref 65–99)
POTASSIUM: 4.2 mmol/L (ref 3.5–5.1)
SODIUM: 139 mmol/L (ref 135–145)
Total Bilirubin: <0.1 mg/dL — ABNORMAL LOW (ref 0.3–1.2)
Total Protein: 5.4 g/dL — ABNORMAL LOW (ref 6.5–8.1)

## 2017-09-17 LAB — CBC WITH DIFFERENTIAL/PLATELET
BASOS ABS: 0 10*3/uL (ref 0.0–0.1)
BASOS PCT: 0 %
EOS ABS: 0 10*3/uL (ref 0.0–0.7)
EOS PCT: 0 %
HCT: 27 % — ABNORMAL LOW (ref 36.0–46.0)
Hemoglobin: 8.7 g/dL — ABNORMAL LOW (ref 12.0–15.0)
Lymphocytes Relative: 11 %
Lymphs Abs: 0.7 10*3/uL (ref 0.7–4.0)
MCH: 26.8 pg (ref 26.0–34.0)
MCHC: 32.2 g/dL (ref 30.0–36.0)
MCV: 83.1 fL (ref 78.0–100.0)
MONO ABS: 0.5 10*3/uL (ref 0.1–1.0)
Monocytes Relative: 7 %
Neutro Abs: 5.7 10*3/uL (ref 1.7–7.7)
Neutrophils Relative %: 82 %
PLATELETS: 149 10*3/uL — AB (ref 150–400)
RBC: 3.25 MIL/uL — AB (ref 3.87–5.11)
RDW: 19.6 % — AB (ref 11.5–15.5)
WBC: 6.9 10*3/uL (ref 4.0–10.5)

## 2017-09-17 LAB — URINE CULTURE: Culture: <10000 — AB

## 2017-09-17 LAB — ECHOCARDIOGRAM COMPLETE
Height: 61 in
Weight: 1696 [oz_av]

## 2017-09-17 LAB — MRSA PCR SCREENING: MRSA BY PCR: NEGATIVE

## 2017-09-17 LAB — LACTATE DEHYDROGENASE: LDH: 457 U/L — ABNORMAL HIGH (ref 98–192)

## 2017-09-17 LAB — SEDIMENTATION RATE: SED RATE: 84 mm/h — AB (ref 0–22)

## 2017-09-17 LAB — HIV ANTIBODY (ROUTINE TESTING W REFLEX): HIV SCREEN 4TH GENERATION: NONREACTIVE

## 2017-09-17 LAB — BRAIN NATRIURETIC PEPTIDE: B NATRIURETIC PEPTIDE 5: 365 pg/mL — AB (ref 0.0–100.0)

## 2017-09-17 LAB — PROCALCITONIN: Procalcitonin: 0.48 ng/mL

## 2017-09-17 MED ORDER — FUROSEMIDE 10 MG/ML IJ SOLN
20.0000 mg | Freq: Once | INTRAMUSCULAR | Status: AC
  Administered 2017-09-17: 20 mg via INTRAVENOUS
  Filled 2017-09-17: qty 2

## 2017-09-17 MED ORDER — METHYLPREDNISOLONE SODIUM SUCC 40 MG IJ SOLR
40.0000 mg | Freq: Three times a day (TID) | INTRAMUSCULAR | Status: DC
  Administered 2017-09-17 – 2017-09-19 (×6): 40 mg via INTRAVENOUS
  Filled 2017-09-17 (×6): qty 1

## 2017-09-17 NOTE — Progress Notes (Signed)
Pt spo2 decreased and remained at 79% on 6L [place]. Pt placed back on bipap and transported to 2C02 on vent. Report given to receiving RT.

## 2017-09-17 NOTE — Progress Notes (Signed)
Returned call to ED to receive report on patient.  Nurse unavailable.

## 2017-09-17 NOTE — Consult Note (Signed)
PULMONARY / CRITICAL CARE MEDICINE   Name: [name] MRN: [id number] DOB: 14-Jul-1956    ADMISSION DATE:  09/16/2017 CONSULTATION DATE: 09/17/2017   CHIEF COMPLAINT:  dyspnea  HISTORY OF PRESENT ILLNESS:   This is a 61 year old chronic smoker who apparently was treated for a URI as an outpatient with azithromycin.  On the morning of 11/30 she became acutely dyspneic and presented to our department of emergency medicine where her saturations on room air were in the 70s.  She has been placed on BiPAP and has remained on BiPAP since presentation.  He reports some nonproductive cough, no fevers no chills no sweats.  The dyspnea was of acute onset over several hours.  Chest x-ray is showing diffuse interstitial infiltrates and there is no evidence of CHF by exam or by echocardiogram.  She denies HIV risk factors, she denies a history of exposure to TB, she denies a history of exposure to other sick family members, does have a history of joint pains but is never been told that she has rheumatoid arthritis or lupus.  PAST MEDICAL HISTORY :  She  has a past medical history of Allergy, Anemia, Arthritis, Asthma, Blood transfusion without reported diagnosis, Cataract, COPD (chronic obstructive pulmonary disease) (HCC), Duodenal ulcer, Emphysema of lung (HCC), Family history of malignant neoplasm of gastrointestinal tract, GERD (gastroesophageal reflux disease), Hepatitis, Hiatal hernia, HTN (hypertension) (10/03/2014), colonic polyps, Hyperlipemia, Internal hemorrhoids, Osteoporosis, Peripheral vascular disease (HCC), Pneumonia, Shortness of breath dyspnea, Stroke (HCC), and Vitamin D deficiency.  PAST SURGICAL HISTORY: She  has a past surgical history that includes Abdominal hysterectomy; Appendectomy; Total hip arthroplasty (Right); Lumbar disc surgery; Elbow surgery (Right); Wrist surgery (Left); Hand surgery (Left); Esophagogastroduodenoscopy (10/2008); Colonoscopy; Upper gastrointestinal endoscopy;  trigeminal neuralgia surgery; Endarterectomy (Right, 12/16/2014); and Cataract extraction, bilateral.  Allergies  Allergen Reactions  . Celebrex [Celecoxib] Other (See Comments)    GI bleed  . Crestor [Rosuvastatin] Other (See Comments)    Made legs hurt  . Penicillins Other (See Comments)    Has patient had a PCN reaction causing immediate rash, facial/tongue/throat swelling, SOB or lightheadedness with hypotension: Yes Has patient had a PCN reaction causing severe rash involving mucus membranes or skin necrosis: No Has patient had a PCN reaction that required hospitalization: No Has patient had a PCN reaction occurring within the last 10 years: Yes If all of the above answers are "NO", then may proceed with Cephalosporin use.   REACTION: whelps  . Pravastatin Other (See Comments)    Muscle aches    No current facility-administered medications on file prior to encounter.    Current Outpatient Medications on File Prior to Encounter  Medication Sig  . albuterol (PROVENTIL HFA;VENTOLIN HFA) 108 (90 Base) MCG/ACT inhaler Inhale 2 puffs into the lungs every 6 (six) hours as needed for wheezing or shortness of breath.  Marland Kitchen aspirin EC 81 MG tablet Take 1 tablet (81 mg total) by mouth daily.  Marland Kitchen ezetimibe (ZETIA) 10 MG tablet Take 1 tablet (10 mg total) by mouth daily.  Marland Kitchen FeFum-FePoly-FA-B Cmp-C-Biot (INTEGRA PLUS) CAPS Take 1 capsule by mouth daily.  . Fluticasone-Salmeterol (ADVAIR DISKUS) 250-50 MCG/DOSE AEPB Inhale 1 puff into the lungs every 12 (twelve) hours.    FAMILY HISTORY:  Her indicated that the status of her mother is unknown. She indicated that her father is alive. She indicated that the status of her brother is unknown. She indicated that the status of her maternal grandmother is unknown. She indicated that the status of her  maternal grandfather is unknown. She indicated that the status of her daughter is unknown. She indicated that the status of her neg hx is unknown. She  indicated that the status of her unknown relative is unknown.   SOCIAL HISTORY: She  reports that she has been smoking cigarettes.  She has a 9.00 pack-year smoking history. she has never used smokeless tobacco. She reports that she drinks alcohol. She reports that she does not use drugs.  REVIEW OF SYSTEMS:   10 system review of systems was performed.  Of significance at baseline she is able to walk ad lib.  She also has a chronic unexplained anemia for which she has been transfused in the past.  SUBJECTIVE:  As Above VITAL SIGNS: BP (!) 105/52   Pulse 81   Temp 99.3 F (37.4 C) (Axillary)   Resp (!) 26   Ht 5\' 1"  (1.549 m)   Wt 103 lb 2.8 oz (46.8 kg)   SpO2 92%   BMI 19.49 kg/m   HEMODYNAMICS:    VENTILATOR SETTINGS: Vent Mode: BIPAP FiO2 (%):  [40 %] 40 % Set Rate:  [10 bmp-12 bmp] 10 bmp PEEP:  [5 cmH20] 5 cmH20  INTAKE / OUTPUT: I/O last 3 completed shifts: In: 1800 [IV Piggyback:1800] Out: -   PHYSICAL EXAMINATION: General: This is a frail-appearing female who appears to be older than her stated 61 years.  Breathing comfortably on BiPAP. Neuro: She is alert and appropriately interactive. Cardiovascular: There is no significant JVD, there is no significant dependent edema.  The limbs are warm.  S1 and S2 are regular without murmur rub or gallop. Lungs: Slightly tachypneic but not labored on BiPAP.  There is symmetric air movement, some scattered rhonchi, no wheezes. Abdomen: Abdomen is flat and soft without organomegaly masses tenderness guarding or rebound.  LABS:  BMET Recent Labs  Lab 09/14/17 1056 09/16/17 1135 09/17/17 0417  NA 137 136 139  K 4.2 3.9 4.2  CL 107 107 112*  CO2 14* 19* 20*  BUN 17 18 16   CREATININE 0.85 0.68 0.47  GLUCOSE 94 87 93    Electrolytes Recent Labs  Lab 09/14/17 1056 09/16/17 1135 09/17/17 0417  CALCIUM 8.5* 8.2* 7.7*  MG 1.9  --   --     CBC Recent Labs  Lab 09/14/17 1056 09/16/17 1135 09/17/17 0417  WBC  5.5 10.5 6.9  HGB 9.7* 9.8* 8.7*  HCT 30.4* 30.7* 27.0*  PLT 229 208 149*    Coag's No results for input(s): APTT, INR in the last 168 hours.  Sepsis Markers Recent Labs  Lab 09/16/17 1156 09/16/17 1415  LATICACIDVEN 1.35 0.64    ABG Recent Labs  Lab 09/16/17 1416  PHART 7.287*  PCO2ART 34.9  PO2ART 169.0*    Liver Enzymes Recent Labs  Lab 09/14/17 1056 09/16/17 1135 09/17/17 0417  AST 18 37 36  ALT 6 10* 10*  ALKPHOS  --  89 137*  BILITOT 0.1* 0.3 <0.1*  ALBUMIN  --  3.0* 2.5*    Cardiac Enzymes No results for input(s): TROPONINI, PROBNP in the last 168 hours.  Glucose Recent Labs  Lab 09/16/17 1713  GLUCAP 111*    Imaging Ct Head Wo Contrast  Result Date: 09/16/2017 CLINICAL DATA:  Hypoxia, and weakness. EXAM: CT HEAD WITHOUT CONTRAST TECHNIQUE: Contiguous axial images were obtained from the base of the skull through the vertex without intravenous contrast. COMPARISON:  None. FINDINGS: Brain: No evidence for acute infarction, hemorrhage, mass lesion, hydrocephalus, or extra-axial  fluid. Normal cerebral volume. No white matter disease. Vascular: Calcification of the cavernous internal carotid arteries consistent with cerebrovascular atherosclerotic disease. No signs of intracranial large vessel occlusion. Skull: Normal. Negative for fracture or focal lesion. Sinuses/Orbits: No acute finding.  BILATERAL cataract extraction. Other: None. IMPRESSION: Calcific cerebrovascular atherosclerotic disease. No acute intracranial findings. No signs of large vessel occlusion. Electronically Signed   By: Elsie Stain M.D.   On: 09/16/2017 19:44   Dg Chest Portable 1 View  Result Date: 09/17/2017 CLINICAL DATA:  Pneumonia EXAM: PORTABLE CHEST 1 VIEW COMPARISON:  09/16/2017 FINDINGS: Diffuse bilateral airspace opacities are again noted, unchanged. Heart is upper limits normal in size. No visible effusions or pneumothorax. No acute bony abnormality. IMPRESSION: Severe  diffuse bilateral airspace disease, not significantly changed. Electronically Signed   By: Charlett Nose M.D.   On: 09/17/2017 08:03     STUDIES:  There are no positive cultures.  ANTIBIOTICS: Rocephin and azithromycin    DISCUSSION: This is a 61 year old chronic smoker who appears with sudden onset of dyspnea.  She has very impressive bilateral interstitial infiltrates do not appear to be cardiogenic in origin.  She has been appropriately covered with Rocephin and azithromycin, however I am concerned that this presentation may very well represent a viral pneumonia or atypical infection.  A PCR panel has been submitted, if it does not return in a timely manner I will empirically start Tamiflu.  I am also concerned that she is relatively lymphopenic and have ordered an HIV as well as an LDH.  ASSESSMENT / PLAN:  PULMONARY A: Hypoxic respiratory failure.  Continue Rocephin and azithromycin for now pending further diagnostic studies.  I have explained to the patient and her husband that she may require intubation both for support and to allow a diagnostic bronchoscopy.   Penny Pia, MD Pulmonary and Critical Care Medicine [facility] Pager: [phone]  09/17/2017, 3:06 PM

## 2017-09-17 NOTE — Progress Notes (Signed)
Echocardiogram 2D Echocardiogram has been performed.  [name] 09/17/2017, 10:24 AM

## 2017-09-17 NOTE — ED Notes (Signed)
Attempted report 

## 2017-09-17 NOTE — Progress Notes (Signed)
[facility] PROGRESS NOTE  [name] UXL:[id number] DOB: November 23, 1955 DOA: 09/16/2017  PCP: [name], MD  Brief History/Interval Summary: 61 year old Caucasian female with a past medical history of COPD who unfortunately continues to smoke, hyperlipidemia, duodenal ulcer, history of stroke and peripheral vascular disease presented to her primary care physician's office with a 2-week history of worsening shortness of breath.  She also had fever at home.  She was noted to be hypoxic with saturations in the 70s.  She was sent over to the hospital for further management.  She was placed on BiPAP in the emergency department.  Concern was for pneumonia versus pulmonary edema.  Reason for Visit: Acute hypoxic respiratory failure  Consultants: None yet  Procedures:  Transthoracic echocardiogram is pending  Antibiotics: Ceftriaxone and azithromycin  Subjective/Interval History: Patient is on the BiPAP.  She states that she is feeling somewhat better compared to yesterday.  Denies any chest pain.  Has had a cough which has been dry.  Her daughter is at the bedside.  ROS: Denies any nausea or vomiting  Objective:  Vital Signs  Vitals:   09/17/17 0630 09/17/17 0715 09/17/17 0730 09/17/17 0800  BP: 99/68 (!) 105/53 (!) 103/56 (!) 105/52  Pulse: 74 80 79 80  Resp: (!) 21 18 19  (!) 22  Temp:    98.1 F (36.7 C)  TempSrc:      SpO2: 92% 93% 92% 94%  Weight:    46.8 kg (103 lb 2.8 oz)  Height:    5\' 1"  (1.549 m)    Intake/Output Summary (Last 24 hours) at 09/17/2017 1125 Last data filed at 09/17/2017 1005 Gross per 24 hour  Intake 2050 ml  Output -  Net 2050 ml   Filed Weights   09/16/17 1142 09/17/17 0800  Weight: 48.1 kg (106 lb) 46.8 kg (103 lb 2.8 oz)    General appearance: alert, cooperative, appears stated age and no distress Head: Normocephalic, without obvious abnormality, atraumatic Resp: Crackles heard bilaterally and appear to be coarse.  Few  scattered wheezes.  No definite rhonchi.  Tachypneic. Cardio: regular rate and rhythm, S1, S2 normal, no murmur, click, rub or gallop GI: soft, non-tender; bowel sounds normal; no masses,  no organomegaly Extremities: No significant edema is noted in bilateral lower extremities Pulses: 2+ and symmetric Neurologic: Awake alert.  No obvious focal neurological deficits.  Lab Results:  Data Reviewed: I have personally reviewed following labs and imaging studies  CBC: Recent Labs  Lab 09/14/17 1056 09/16/17 1135 09/17/17 0417  WBC 5.5 10.5 6.9  NEUTROABS 3,740 9.3* 5.7  HGB 9.7* 9.8* 8.7*  HCT 30.4* 30.7* 27.0*  MCV 83.5 83.0 83.1  PLT 229 208 149*    Basic Metabolic Panel: Recent Labs  Lab 09/14/17 1056 09/16/17 1135 09/17/17 0417  NA 137 136 139  K 4.2 3.9 4.2  CL 107 107 112*  CO2 14* 19* 20*  GLUCOSE 94 87 93  BUN 17 18 16   CREATININE 0.85 0.68 0.47  CALCIUM 8.5* 8.2* 7.7*  MG 1.9  --   --     GFR: Estimated Creatinine Clearance: 55.9 mL/min (by C-G formula based on SCr of 0.47 mg/dL).  Liver Function Tests: Recent Labs  Lab 09/14/17 1056 09/16/17 1135 09/17/17 0417  AST 18 37 36  ALT 6 10* 10*  ALKPHOS  --  89 137*  BILITOT 0.1* 0.3 <0.1*  PROT 7.2 6.5 5.4*  ALBUMIN  --  3.0* 2.5*     CBG: Recent Labs  Lab 09/16/17 1713  GLUCAP 111*     Radiology Studies: Ct Head Wo Contrast  Result Date: 09/16/2017 CLINICAL DATA:  Hypoxia, and weakness. EXAM: CT HEAD WITHOUT CONTRAST TECHNIQUE: Contiguous axial images were obtained from the base of the skull through the vertex without intravenous contrast. COMPARISON:  None. FINDINGS: Brain: No evidence for acute infarction, hemorrhage, mass lesion, hydrocephalus, or extra-axial fluid. Normal cerebral volume. No white matter disease. Vascular: Calcification of the cavernous internal carotid arteries consistent with cerebrovascular atherosclerotic disease. No signs of intracranial large vessel occlusion. Skull:  Normal. Negative for fracture or focal lesion. Sinuses/Orbits: No acute finding.  BILATERAL cataract extraction. Other: None. IMPRESSION: Calcific cerebrovascular atherosclerotic disease. No acute intracranial findings. No signs of large vessel occlusion. Electronically Signed   By: Elsie Stain M.D.   On: 09/16/2017 19:44   Ct Angio Chest Pe W And/or Wo Contrast  Result Date: 09/16/2017 CLINICAL DATA:  Hypoxic, shortness of breath, altered mental status, can't breathe, history end-stage COPD, hypertension, smoker EXAM: CT ANGIOGRAPHY CHEST WITH CONTRAST TECHNIQUE: Multidetector CT imaging of the chest was performed using the standard protocol during bolus administration of intravenous contrast. Multiplanar CT image reconstructions and MIPs were obtained to evaluate the vascular anatomy. CONTRAST:  80mL ISOVUE-370 IOPAMIDOL (ISOVUE-370) INJECTION 76% IV COMPARISON:  09/02/2016 FINDINGS: Cardiovascular: Aorta normal caliber without aneurysm or dissection. No pericardial effusion. Pulmonary arteries well opacified and patent. No evidence of pulmonary embolism. Mediastinum/Nodes: Base of cervical region normal appearance. Esophagus unremarkable. No definite thoracic adenopathy. Lungs/Pleura: Diffuse BILATERAL pulmonary infiltrates, mixed interstitial and alveolar, with predominance in the mid to upper lungs. These may represent diffuse pulmonary edema or infection. Unable to exclude small pulmonary nodules in the setting of diffuse infiltrates. No pleural effusion or pneumothorax. Upper Abdomen: At reflux of contrast into IVC and hepatic veins question elevated RIGHT heart pressures. Remaining visualized upper abdomen unremarkable. Musculoskeletal: No acute osseous findings. Review of the MIP images confirms the above findings. IMPRESSION: Diffuse BILATERAL pulmonary infiltrates with predominance in the mid upper lungs question pulmonary edema or infection. No evidence of pulmonary embolism. Electronically Signed    By: Ulyses Southward M.D.   On: 09/16/2017 15:13   Dg Chest Portable 1 View  Result Date: 09/17/2017 CLINICAL DATA:  Pneumonia EXAM: PORTABLE CHEST 1 VIEW COMPARISON:  09/16/2017 FINDINGS: Diffuse bilateral airspace opacities are again noted, unchanged. Heart is upper limits normal in size. No visible effusions or pneumothorax. No acute bony abnormality. IMPRESSION: Severe diffuse bilateral airspace disease, not significantly changed. Electronically Signed   By: Charlett Nose M.D.   On: 09/17/2017 08:03   Dg Chest Port 1 View  Result Date: 09/16/2017 CLINICAL DATA:  Increasing shortness of breath. History of asthma, COPD, hypertension and pneumonia. EXAM: PORTABLE CHEST 1 VIEW COMPARISON:  Radiographs 11/22/2014.  CT 09/02/2016. FINDINGS: 1138 hours. The heart size and mediastinal contours are stable. There are new diffuse perihilar airspace opacities, greater on the left. There is no pleural effusion or pneumothorax. The bones and soft tissues appear unremarkable. Telemetry leads overlie the chest. IMPRESSION: New bilateral perihilar airspace opacities, most consistent with acute pulmonary edema. Atypical infection considered less likely. Electronically Signed   By: Carey Bullocks M.D.   On: 09/16/2017 12:00     Medications:  Scheduled: . aspirin EC  81 mg Oral Daily  . docusate sodium  100 mg Oral BID  . enoxaparin (LOVENOX) injection  30 mg Subcutaneous Q24H  . ezetimibe  10 mg Oral Daily  . fluticasone furoate-vilanterol  1  puff Inhalation Daily   Continuous: . azithromycin 500 mg (09/17/17 1005)  . cefTRIAXone (ROCEPHIN)  IV     GNF:[id number] **OR** acetaminophen, albuterol  Assessment/Plan:  Active Problems:   Pneumonia    Acute respiratory failure with hypoxia Saturations were initially in the 70s to 80% at her PCPs office.  Currently on BiPAP.  ABG reviewed.  Patient seems to be stable currently.  Chest x-ray continues to show bilateral infiltrates.  Bilateral pulmonary  infiltrates Her presentation is quite puzzling.  She has had the symptoms for 2 weeks.  Patient denies any history of cardiac disease.  Last echocardiogram is from November of last year which showed normal systolic function.  Echocardiogram from this hospitalization is pending.  Considering history of fever this could be infectious.  BNP was not that elevated.  Patient has been started on ceftriaxone and azithromycin which will be continued.  Follow-up on cultures.  WBC is normal.  We will give her a low-dose of Lasix and then wait on echocardiogram report.  Patient does have a history of COPD.  Does not have any history of autoimmune disorders.  May need to consult pulmonology depending on results of the echocardiogram.  Continue steroids for now.  Check EKG.  History of COPD with continued tobacco abuse Patient was counseled. BiPap as mentioned above.  Continue to monitor.  Continue home medications.  Iron Deficiency Anemia Hemoglobin is noted to be low compared to her usual baseline.  No evidence for overt bleeding currently.  Anemia panel was done recently which showed iron level to be 19 TIBC 478.  Unfortunately ferritin was not checked.  Ferritin was 28 in February.  He likely has iron deficiency.  Etiology is unclear.  It appears that she had a colonoscopy in March 2017 which was unremarkable.  Upper endoscopy done at the same time showed multiple gastric and duodenal ulcers.  Capsule study was done in September 2017 which showed small erosions throughout small bowel.  Biopsies taken during endoscopy did not show any concerning findings on pathology.  This will need further evaluation as outpatient.  She has been seen by hematology previously, Dr. Pamelia Hoit.  DVT Prophylaxis: Lovenox    Code Status: FullCode Family Communication: Discussed with the patient and her daughter Disposition Plan: Management as outlined above.    LOS: 1 day   Osvaldo Shipper  [facility] Pager  [phone] 09/17/2017, 11:25 AM  If 7PM-7AM, please contact night-coverage at [email or web address], password [facility]

## 2017-09-17 NOTE — Progress Notes (Signed)
Pt taken off bipap and placed on 4L . Pt in no distress, no increased wob, Denies SOB. RT will continue to monitor.

## 2017-09-18 ENCOUNTER — Encounter (HOSPITAL_COMMUNITY): Payer: Self-pay | Admitting: General Practice

## 2017-09-18 DIAGNOSIS — R918 Other nonspecific abnormal finding of lung field: Secondary | ICD-10-CM

## 2017-09-18 LAB — RESPIRATORY PANEL BY PCR
Adenovirus: NOT DETECTED
BORDETELLA PERTUSSIS-RVPCR: NOT DETECTED
CHLAMYDOPHILA PNEUMONIAE-RVPPCR: NOT DETECTED
CORONAVIRUS 229E-RVPPCR: NOT DETECTED
CORONAVIRUS HKU1-RVPPCR: NOT DETECTED
Coronavirus NL63: NOT DETECTED
Coronavirus OC43: NOT DETECTED
Influenza A: NOT DETECTED
Influenza B: NOT DETECTED
MYCOPLASMA PNEUMONIAE-RVPPCR: NOT DETECTED
Metapneumovirus: NOT DETECTED
PARAINFLUENZA VIRUS 3-RVPPCR: NOT DETECTED
Parainfluenza Virus 1: NOT DETECTED
Parainfluenza Virus 2: NOT DETECTED
Parainfluenza Virus 4: NOT DETECTED
RHINOVIRUS / ENTEROVIRUS - RVPPCR: NOT DETECTED
Respiratory Syncytial Virus: NOT DETECTED

## 2017-09-18 LAB — BASIC METABOLIC PANEL
ANION GAP: 7 (ref 5–15)
BUN: 16 mg/dL (ref 6–20)
CO2: 25 mmol/L (ref 22–32)
Calcium: 8.2 mg/dL — ABNORMAL LOW (ref 8.9–10.3)
Chloride: 111 mmol/L (ref 101–111)
Creatinine, Ser: 0.48 mg/dL (ref 0.44–1.00)
GFR calc Af Amer: >60 mL/min (ref >60)
GLUCOSE: 113 mg/dL — AB (ref 65–99)
POTASSIUM: 4 mmol/L (ref 3.5–5.1)
Sodium: 143 mmol/L (ref 135–145)

## 2017-09-18 LAB — CBC
HCT: 26 % — ABNORMAL LOW (ref 36.0–46.0)
HEMOGLOBIN: 8 g/dL — AB (ref 12.0–15.0)
MCH: 26.1 pg (ref 26.0–34.0)
MCHC: 30.8 g/dL (ref 30.0–36.0)
MCV: 85 fL (ref 78.0–100.0)
PLATELETS: 189 10*3/uL (ref 150–400)
RBC: 3.06 MIL/uL — AB (ref 3.87–5.11)
RDW: 19.4 % — ABNORMAL HIGH (ref 11.5–15.5)
WBC: 5.9 10*3/uL (ref 4.0–10.5)

## 2017-09-18 LAB — HIV ANTIBODY (ROUTINE TESTING W REFLEX): HIV SCREEN 4TH GENERATION: NONREACTIVE

## 2017-09-18 MED ORDER — CHLORHEXIDINE GLUCONATE 0.12 % MT SOLN
15.0000 mL | Freq: Two times a day (BID) | OROMUCOSAL | Status: DC
  Administered 2017-09-18 – 2017-09-20 (×5): 15 mL via OROMUCOSAL
  Filled 2017-09-18 (×6): qty 15

## 2017-09-18 MED ORDER — MORPHINE SULFATE (PF) 2 MG/ML IV SOLN
2.0000 mg | INTRAVENOUS | Status: DC | PRN
  Administered 2017-09-18 – 2017-09-23 (×6): 2 mg via INTRAVENOUS
  Filled 2017-09-18 (×6): qty 1

## 2017-09-18 MED ORDER — NICOTINE 14 MG/24HR TD PT24
14.0000 mg | MEDICATED_PATCH | Freq: Every day | TRANSDERMAL | Status: DC
  Administered 2017-09-18 – 2017-09-23 (×6): 14 mg via TRANSDERMAL
  Filled 2017-09-18 (×6): qty 1

## 2017-09-18 MED ORDER — ALUM & MAG HYDROXIDE-SIMETH 200-200-20 MG/5ML PO SUSP
30.0000 mL | ORAL | Status: DC | PRN
  Administered 2017-09-18 – 2017-09-20 (×3): 30 mL via ORAL
  Filled 2017-09-18 (×3): qty 30

## 2017-09-18 MED ORDER — ORAL CARE MOUTH RINSE
15.0000 mL | Freq: Two times a day (BID) | OROMUCOSAL | Status: DC
  Administered 2017-09-18 – 2017-09-19 (×3): 15 mL via OROMUCOSAL

## 2017-09-18 NOTE — Progress Notes (Signed)
Pt taken off bipap by RN and placed on NRB mask.  Pt tolerating well, RT will continue to monitor.

## 2017-09-18 NOTE — Progress Notes (Signed)
PULMONARY / CRITICAL CARE MEDICINE   Name: [name] MRN: [id number] DOB: [birth year]    ADMISSION DATE:  09/16/2017   CHIEF COMPLAINT:  dyspnea  HISTORY OF PRESENT ILLNESS: This is a 61 year old chronic smoker who was treated as an outpatient for URI with azithromycin.  Her dyspnea progressed and on the morning of 1130 she presented to our department emergency medicine with saturations in the 70s.  She has been supported with BiPAP since that time and treated with empiric antibiotics for diffuse interstitial infiltrates.  We have no positive cultures, sed rate is 84, LDH is elevated, HIV is negative, and she has no history of immunosuppression.  Overnight she thinks that her stop her dyspnea is subjectively a little bit better but she still has a cough and she is unable to clear her secretions.  Her BiPAP settings are essentially unchanged on 40% and 5/10   PAST MEDICAL HISTORY :  She  has a past medical history of Allergy, Anemia, Arthritis, Asthma, Blood transfusion without reported diagnosis, Cataract, COPD (chronic obstructive pulmonary disease) (HCC), Duodenal ulcer, Emphysema of lung (HCC), Family history of malignant neoplasm of gastrointestinal tract, GERD (gastroesophageal reflux disease), Hepatitis, Hiatal hernia, HTN (hypertension) (10/03/2014), colonic polyps, Hyperlipemia, Internal hemorrhoids, Osteoporosis, Peripheral vascular disease (HCC), Pneumonia, Shortness of breath dyspnea, Stroke (HCC), and Vitamin D deficiency.  PAST SURGICAL HISTORY: She  has a past surgical history that includes Abdominal hysterectomy; Appendectomy; Total hip arthroplasty (Right); Lumbar disc surgery; Elbow surgery (Right); Wrist surgery (Left); Hand surgery (Left); Esophagogastroduodenoscopy (10/2008); Colonoscopy; Upper gastrointestinal endoscopy; trigeminal neuralgia surgery; Endarterectomy (Right, 12/16/2014); and Cataract extraction, bilateral.  Allergies  Allergen Reactions  . Celebrex  [Celecoxib] Other (See Comments)    GI bleed  . Crestor [Rosuvastatin] Other (See Comments)    Made legs hurt  . Penicillins Other (See Comments)    Has patient had a PCN reaction causing immediate rash, facial/tongue/throat swelling, SOB or lightheadedness with hypotension: Yes Has patient had a PCN reaction causing severe rash involving mucus membranes or skin necrosis: No Has patient had a PCN reaction that required hospitalization: No Has patient had a PCN reaction occurring within the last 10 years: Yes If all of the above answers are "NO", then may proceed with Cephalosporin use.   REACTION: whelps  . Pravastatin Other (See Comments)    Muscle aches    No current facility-administered medications on file prior to encounter.    Current Outpatient Medications on File Prior to Encounter  Medication Sig  . albuterol (PROVENTIL HFA;VENTOLIN HFA) 108 (90 Base) MCG/ACT inhaler Inhale 2 puffs into the lungs every 6 (six) hours as needed for wheezing or shortness of breath.  Marland Kitchen aspirin EC 81 MG tablet Take 1 tablet (81 mg total) by mouth daily.  Marland Kitchen ezetimibe (ZETIA) 10 MG tablet Take 1 tablet (10 mg total) by mouth daily.  Marland Kitchen FeFum-FePoly-FA-B Cmp-C-Biot (INTEGRA PLUS) CAPS Take 1 capsule by mouth daily.  . Fluticasone-Salmeterol (ADVAIR DISKUS) 250-50 MCG/DOSE AEPB Inhale 1 puff into the lungs every 12 (twelve) hours.    FAMILY HISTORY:  Her indicated that the status of her mother is unknown. She indicated that her father is alive. She indicated that the status of her brother is unknown. She indicated that the status of her maternal grandmother is unknown. She indicated that the status of her maternal grandfather is unknown. She indicated that the status of her daughter is unknown. She indicated that the status of her neg hx is unknown. She indicated that the status  of her unknown relative is unknown.   SOCIAL HISTORY: She  reports that she has been smoking cigarettes.  She has a 9.00  pack-year smoking history. she has never used smokeless tobacco. She reports that she drinks alcohol. She reports that she does not use drugs.  REVIEW OF SYSTEMS:   Noncontributory  SUBJECTIVE:  As above  VITAL SIGNS: BP (!) 102/56 (BP Location: Left Arm)   Pulse 60   Temp 98.2 F (36.8 C) (Axillary)   Resp 16   Ht 5\' 1"  (1.549 m)   Wt 95 lb 7.4 oz (43.3 kg)   SpO2 95%   BMI 18.04 kg/m   HEMODYNAMICS:    VENTILATOR SETTINGS: Vent Mode: BIPAP FiO2 (%):  [40 %] 40 % Set Rate:  [10 bmp] 10 bmp PEEP:  [5 cmH20] 5 cmH20  INTAKE / OUTPUT: I/O last 3 completed shifts: In: 300 [IV Piggyback:300] Out: -   PHYSICAL EXAMINATION: General: Somewhat frail appearing and appearing to be her stated age. Cardiovascular: No JVD, no dependent edema.  S1 and S2 are regular without murmur rub. Lungs: Respirations are unlabored on BiPAP.  There is symmetric air movement, no wheezes, scattered rhonchi. Abdomen: The abdomen is flat and soft without any organomegaly masses tenderness guarding or rebound   LABS:  BMET Recent Labs  Lab 09/16/17 1135 09/17/17 0417 09/18/17 0329  NA 136 139 143  K 3.9 4.2 4.0  CL 107 112* 111  CO2 19* 20* 25  BUN 18 16 16   CREATININE 0.68 0.47 0.48  GLUCOSE 87 93 113*    Electrolytes Recent Labs  Lab 09/14/17 1056 09/16/17 1135 09/17/17 0417 09/18/17 0329  CALCIUM 8.5* 8.2* 7.7* 8.2*  MG 1.9  --   --   --     CBC Recent Labs  Lab 09/16/17 1135 09/17/17 0417 09/18/17 0329  WBC 10.5 6.9 5.9  HGB 9.8* 8.7* 8.0*  HCT 30.7* 27.0* 26.0*  PLT 208 149* 189    Coag's No results for input(s): APTT, INR in the last 168 hours.  Sepsis Markers Recent Labs  Lab 09/16/17 1156 09/16/17 1415 09/17/17 1632  LATICACIDVEN 1.35 0.64  --   PROCALCITON  --   --  0.48    ABG Recent Labs  Lab 09/16/17 1416  PHART 7.287*  PCO2ART 34.9  PO2ART 169.0*    Liver Enzymes Recent Labs  Lab 09/14/17 1056 09/16/17 1135 09/17/17 0417  AST  18 37 36  ALT 6 10* 10*  ALKPHOS  --  89 137*  BILITOT 0.1* 0.3 <0.1*  ALBUMIN  --  3.0* 2.5*    Cardiac Enzymes No results for input(s): TROPONINI, PROBNP in the last 168 hours.  Glucose Recent Labs  Lab 09/16/17 1713  GLUCAP 111*    Imaging Dg Chest Portable 1 View  Result Date: 09/17/2017 CLINICAL DATA:  Pneumonia EXAM: PORTABLE CHEST 1 VIEW COMPARISON:  09/16/2017 FINDINGS: Diffuse bilateral airspace opacities are again noted, unchanged. Heart is upper limits normal in size. No visible effusions or pneumothorax. No acute bony abnormality. IMPRESSION: Severe diffuse bilateral airspace disease, not significantly changed. Electronically Signed   By: Charlett Nose M.D.   On: 09/17/2017 08:03      CULTURES: No positive cultures and respiratory panel is still pending.  A Legionella urine antigen was ordered this morning.  ANTIBIOTICS: Rocephin and azithromycin   DISCUSSION: This is a 61 year old chronic smoker who presents with dyspnea and diffuse bilateral infiltrates.  We have no culture data to guide selection of  antibiotics and she is being treated empirically.  Objectively she seems a little bit improved on her current regimen if she fails to show continued improvement and will make arrangements for bronchoscopy on 12/3.  ASSESSMENT / PLAN:  PULMONARY A: Diffuse bilateral infiltrates with hypoxic respiratory failure etiology unknown.   Treatment with Rocephin and azithromycin  CARDIOVASCULAR A: He has a very mildly elevated BNP however has no clinical evidence of congestive heart failure and an echocardiogram showing normal LV function.     Penny Pia, MD Pulmonary and Critical Care Medicine [facility] Pager: [phone]  09/18/2017, 7:26 AM

## 2017-09-18 NOTE — Progress Notes (Signed)
[facility] PROGRESS NOTE  [name] MWU:[id number] DOB: September 17, 1956 DOA: 09/16/2017  PCP: [name], MD  Brief History/Interval Summary: 61 year old Caucasian female with a past medical history of COPD who unfortunately continues to smoke, hyperlipidemia, duodenal ulcer, history of stroke and peripheral vascular disease presented to her primary care physician's office with a 2-week history of worsening shortness of breath.  She also had fever at home.  She was noted to be hypoxic with saturations in the 70s.  She was sent over to the hospital for further management.  She was placed on BiPAP in the emergency department.  Concern was for pneumonia versus pulmonary edema.  Reason for Visit: Acute hypoxic respiratory failure  Consultants: Pulmonology  Procedures:  Transthoracic echocardiogram Study Conclusions  - Left ventricle: The cavity size was normal. Wall thickness was   normal. Systolic function was normal. The estimated ejection   fraction was in the range of 55% to 60%. Wall motion was normal;   there were no regional wall motion abnormalities. There was no   evidence of elevated ventricular filling pressure by Doppler   parameters. - Right atrium: The atrium was mildly dilated. - Tricuspid valve: There was mild regurgitation. - Pulmonary arteries: Systolic pressure was mildly to moderately   increased. PA peak pressure: 46 mm Hg (S).  Impressions:  - No cardiac source of emboli was indentified.  Antibiotics: Ceftriaxone and azithromycin  Subjective/Interval History: Patient remains on the BiPAP.  States that she is feeling slightly better.  Shortness of breath has improved.  Continues to have a cough.  Complains of lower back pain.  Denies any recent falls or injuries.    ROS: Denies any nausea or vomiting.  Objective:  Vital Signs  Vitals:   09/18/17 0345 09/18/17 0532 09/18/17 0706 09/18/17 0821  BP: (!) 102/56     Pulse: 60   73  Resp:  16   (!) 25  Temp: 98.2 F (36.8 C)  98.3 F (36.8 C)   TempSrc: Axillary  Axillary   SpO2: 95%   96%  Weight:  43.3 kg (95 lb 7.4 oz)    Height:        Intake/Output Summary (Last 24 hours) at 09/18/2017 0829 Last data filed at 09/17/2017 1600 Gross per 24 hour  Intake 300 ml  Output -  Net 300 ml   Filed Weights   09/16/17 1142 09/17/17 0800 09/18/17 0532  Weight: 48.1 kg (106 lb) 46.8 kg (103 lb 2.8 oz) 43.3 kg (95 lb 7.4 oz)    General appearance: Awake alert.  No distress Resp: Appears to have improved air entry compared to yesterday.  Crackles appreciated bilaterally.  Few scattered wheezes.  Remains on BiPAP Cardio: S1-S2 is normal regular.  No S3-S4.  No rubs murmurs or bruit GI: Abdomen is soft.  Nontender nondistended.  Bowel sounds are present.  No masses organomegaly. Extremities: No edema Pulses: 2+ and symmetric Neurologic: Awake alert.  Some restriction in range of motion of the right lower extremity.  Reflexes are equal.    Lab Results:  Data Reviewed: I have personally reviewed following labs and imaging studies  CBC: Recent Labs  Lab 09/14/17 1056 09/16/17 1135 09/17/17 0417 09/18/17 0329  WBC 5.5 10.5 6.9 5.9  NEUTROABS 3,740 9.3* 5.7  --   HGB 9.7* 9.8* 8.7* 8.0*  HCT 30.4* 30.7* 27.0* 26.0*  MCV 83.5 83.0 83.1 85.0  PLT 229 208 149* 189    Basic Metabolic Panel: Recent Labs  Lab 09/14/17  1056 09/16/17 1135 09/17/17 0417 09/18/17 0329  NA 137 136 139 143  K 4.2 3.9 4.2 4.0  CL 107 107 112* 111  CO2 14* 19* 20* 25  GLUCOSE 94 87 93 113*  BUN 17 18 16 16   CREATININE 0.85 0.68 0.47 0.48  CALCIUM 8.5* 8.2* 7.7* 8.2*  MG 1.9  --   --   --     GFR: Estimated Creatinine Clearance: 51.8 mL/min (by C-G formula based on SCr of 0.48 mg/dL).  Liver Function Tests: Recent Labs  Lab 09/14/17 1056 09/16/17 1135 09/17/17 0417  AST 18 37 36  ALT 6 10* 10*  ALKPHOS  --  89 137*  BILITOT 0.1* 0.3 <0.1*  PROT 7.2 6.5 5.4*  ALBUMIN  --   3.0* 2.5*     CBG: Recent Labs  Lab 09/16/17 1713  GLUCAP 111*     Radiology Studies: Ct Head Wo Contrast  Result Date: 09/16/2017 CLINICAL DATA:  Hypoxia, and weakness. EXAM: CT HEAD WITHOUT CONTRAST TECHNIQUE: Contiguous axial images were obtained from the base of the skull through the vertex without intravenous contrast. COMPARISON:  None. FINDINGS: Brain: No evidence for acute infarction, hemorrhage, mass lesion, hydrocephalus, or extra-axial fluid. Normal cerebral volume. No white matter disease. Vascular: Calcification of the cavernous internal carotid arteries consistent with cerebrovascular atherosclerotic disease. No signs of intracranial large vessel occlusion. Skull: Normal. Negative for fracture or focal lesion. Sinuses/Orbits: No acute finding.  BILATERAL cataract extraction. Other: None. IMPRESSION: Calcific cerebrovascular atherosclerotic disease. No acute intracranial findings. No signs of large vessel occlusion. Electronically Signed   By: Elsie Stain M.D.   On: 09/16/2017 19:44   Ct Angio Chest Pe W And/or Wo Contrast  Result Date: 09/16/2017 CLINICAL DATA:  Hypoxic, shortness of breath, altered mental status, can't breathe, history end-stage COPD, hypertension, smoker EXAM: CT ANGIOGRAPHY CHEST WITH CONTRAST TECHNIQUE: Multidetector CT imaging of the chest was performed using the standard protocol during bolus administration of intravenous contrast. Multiplanar CT image reconstructions and MIPs were obtained to evaluate the vascular anatomy. CONTRAST:  80mL ISOVUE-370 IOPAMIDOL (ISOVUE-370) INJECTION 76% IV COMPARISON:  09/02/2016 FINDINGS: Cardiovascular: Aorta normal caliber without aneurysm or dissection. No pericardial effusion. Pulmonary arteries well opacified and patent. No evidence of pulmonary embolism. Mediastinum/Nodes: Base of cervical region normal appearance. Esophagus unremarkable. No definite thoracic adenopathy. Lungs/Pleura: Diffuse BILATERAL pulmonary  infiltrates, mixed interstitial and alveolar, with predominance in the mid to upper lungs. These may represent diffuse pulmonary edema or infection. Unable to exclude small pulmonary nodules in the setting of diffuse infiltrates. No pleural effusion or pneumothorax. Upper Abdomen: At reflux of contrast into IVC and hepatic veins question elevated RIGHT heart pressures. Remaining visualized upper abdomen unremarkable. Musculoskeletal: No acute osseous findings. Review of the MIP images confirms the above findings. IMPRESSION: Diffuse BILATERAL pulmonary infiltrates with predominance in the mid upper lungs question pulmonary edema or infection. No evidence of pulmonary embolism. Electronically Signed   By: Ulyses Southward M.D.   On: 09/16/2017 15:13   Dg Chest Portable 1 View  Result Date: 09/17/2017 CLINICAL DATA:  Pneumonia EXAM: PORTABLE CHEST 1 VIEW COMPARISON:  09/16/2017 FINDINGS: Diffuse bilateral airspace opacities are again noted, unchanged. Heart is upper limits normal in size. No visible effusions or pneumothorax. No acute bony abnormality. IMPRESSION: Severe diffuse bilateral airspace disease, not significantly changed. Electronically Signed   By: Charlett Nose M.D.   On: 09/17/2017 08:03   Dg Chest Port 1 View  Result Date: 09/16/2017 CLINICAL DATA:  Increasing shortness  of breath. History of asthma, COPD, hypertension and pneumonia. EXAM: PORTABLE CHEST 1 VIEW COMPARISON:  Radiographs 11/22/2014.  CT 09/02/2016. FINDINGS: 1138 hours. The heart size and mediastinal contours are stable. There are new diffuse perihilar airspace opacities, greater on the left. There is no pleural effusion or pneumothorax. The bones and soft tissues appear unremarkable. Telemetry leads overlie the chest. IMPRESSION: New bilateral perihilar airspace opacities, most consistent with acute pulmonary edema. Atypical infection considered less likely. Electronically Signed   By: Carey Bullocks M.D.   On: 09/16/2017 12:00      Medications:  Scheduled: . aspirin EC  81 mg Oral Daily  . docusate sodium  100 mg Oral BID  . enoxaparin (LOVENOX) injection  30 mg Subcutaneous Q24H  . ezetimibe  10 mg Oral Daily  . fluticasone furoate-vilanterol  1 puff Inhalation Daily  . methylPREDNISolone (SOLU-MEDROL) injection  40 mg Intravenous Q8H  . nicotine  14 mg Transdermal Daily   Continuous: . azithromycin Stopped (09/17/17 1256)  . cefTRIAXone (ROCEPHIN)  IV Stopped (09/17/17 1339)   ZOX:[id number] **OR** acetaminophen, albuterol, morphine injection  Assessment/Plan:  Active Problems:   Pneumonia    Acute respiratory failure with hypoxia Patient appears to be stable on BiPAP.  Continue antibiotics and steroids.  Pulmonology is following.    Bilateral pulmonary infiltrates Reason for her infiltrates is not entirely clear.  Considering normal ejection fraction and only a mildly elevated BNP this is unlikely to be cardiac in etiology and is most likely infectious considering history of fever.  However her WBC was normal.  Patient remains on ceftriaxone and azithromycin.  Pulmonology is following closely.  She remains on BiPAP.  Respiratory viral panel is unremarkable.  If she does not improve she will need further workup including bronchoscopy.  ESR is noted to be elevated.  Pro-calcitonin is less than 0.5.  HIV is nonreactive.  May also need autoimmune workup.  EKG did not show any ischemic changes.  Discussed with Dr. Wallace Cullens with pulmonology.  History of COPD with continued tobacco abuse Patient was counseled. BiPap as mentioned above.  Continue to monitor.  Continue home medications.  Continue steroids.  Iron Deficiency Anemia Hemoglobin is noted to be low compared to her usual baseline.  No evidence for overt bleeding currently.  Hemoglobin is more or less stable.  Continue to monitor.  Anemia panel was done recently which showed iron level to be 19 TIBC 478.  Unfortunately ferritin was not checked.   Ferritin was 28 in February.  He likely has iron deficiency.  Etiology is unclear.  It appears that she had a colonoscopy in March 2017 which was unremarkable.  Upper endoscopy done at the same time showed multiple gastric and duodenal ulcers.  Capsule study was done in September 2017 which showed small erosions throughout small bowel.  Biopsies taken during endoscopy did not show any concerning findings on pathology.  This will need further evaluation as outpatient.  She has been seen by hematology previously, Dr. Pamelia Hoit.  Back pain Possibly positional.  No history of falls or injuries recently.  No obvious focal neurological deficits appreciated.  Continue to monitor for now.  May need testing if symptoms get worse or do not improve.  Currently her respiratory status is too tenuous.  DVT Prophylaxis: Lovenox    Code Status: FullCode Family Communication: Discussed with the patient and her daughter. Disposition Plan: Management as outlined above.    LOS: 2 days   [employer]  [facility] Pager [phone] 09/18/2017,  8:29 AM  If 7PM-7AM, please contact night-coverage at [email or web address], password [facility]

## 2017-09-19 ENCOUNTER — Encounter (HOSPITAL_COMMUNITY): Payer: Self-pay | Admitting: Acute Care

## 2017-09-19 DIAGNOSIS — J189 Pneumonia, unspecified organism: Principal | ICD-10-CM

## 2017-09-19 DIAGNOSIS — D649 Anemia, unspecified: Secondary | ICD-10-CM

## 2017-09-19 DIAGNOSIS — J9601 Acute respiratory failure with hypoxia: Secondary | ICD-10-CM

## 2017-09-19 DIAGNOSIS — J9600 Acute respiratory failure, unspecified whether with hypoxia or hypercapnia: Secondary | ICD-10-CM

## 2017-09-19 LAB — PROCALCITONIN: Procalcitonin: 0.12 ng/mL

## 2017-09-19 LAB — BASIC METABOLIC PANEL
Anion gap: 5 (ref 5–15)
BUN: 17 mg/dL (ref 6–20)
CHLORIDE: 102 mmol/L (ref 101–111)
CO2: 30 mmol/L (ref 22–32)
CREATININE: 0.35 mg/dL — AB (ref 0.44–1.00)
Calcium: 8.5 mg/dL — ABNORMAL LOW (ref 8.9–10.3)
GFR calc Af Amer: >60 mL/min (ref >60)
GLUCOSE: 132 mg/dL — AB (ref 65–99)
Potassium: 4.5 mmol/L (ref 3.5–5.1)
SODIUM: 137 mmol/L (ref 135–145)

## 2017-09-19 LAB — CBC
HCT: 25 % — ABNORMAL LOW (ref 36.0–46.0)
Hemoglobin: 7.7 g/dL — ABNORMAL LOW (ref 12.0–15.0)
MCH: 26.1 pg (ref 26.0–34.0)
MCHC: 30.8 g/dL (ref 30.0–36.0)
MCV: 84.7 fL (ref 78.0–100.0)
PLATELETS: 185 10*3/uL (ref 150–400)
RBC: 2.95 MIL/uL — ABNORMAL LOW (ref 3.87–5.11)
RDW: 18.7 % — ABNORMAL HIGH (ref 11.5–15.5)
WBC: 8.5 10*3/uL (ref 4.0–10.5)

## 2017-09-19 LAB — LEGIONELLA PNEUMOPHILA SEROGP 1 UR AG: L. PNEUMOPHILA SEROGP 1 UR AG: NEGATIVE

## 2017-09-19 LAB — SEDIMENTATION RATE: SED RATE: 84 mm/h — AB (ref 0–22)

## 2017-09-19 LAB — PREPARE RBC (CROSSMATCH)

## 2017-09-19 MED ORDER — POTASSIUM CHLORIDE CRYS ER 20 MEQ PO TBCR
40.0000 meq | EXTENDED_RELEASE_TABLET | Freq: Once | ORAL | Status: AC
Start: 2017-09-19 — End: 2017-09-19
  Administered 2017-09-19: 40 meq via ORAL
  Filled 2017-09-19: qty 2

## 2017-09-19 MED ORDER — FUROSEMIDE 10 MG/ML IJ SOLN
20.0000 mg | Freq: Once | INTRAMUSCULAR | Status: AC
  Administered 2017-09-19: 20 mg via INTRAVENOUS
  Filled 2017-09-19: qty 2

## 2017-09-19 MED ORDER — METHYLPREDNISOLONE SODIUM SUCC 125 MG IJ SOLR
60.0000 mg | Freq: Three times a day (TID) | INTRAMUSCULAR | Status: DC
  Administered 2017-09-19 – 2017-09-21 (×6): 60 mg via INTRAVENOUS
  Filled 2017-09-19 (×6): qty 2

## 2017-09-19 MED ORDER — GUAIFENESIN ER 600 MG PO TB12
600.0000 mg | ORAL_TABLET | Freq: Two times a day (BID) | ORAL | Status: DC
  Administered 2017-09-19 – 2017-09-23 (×9): 600 mg via ORAL
  Filled 2017-09-19 (×9): qty 1

## 2017-09-19 MED ORDER — SODIUM CHLORIDE 0.9 % IV SOLN
Freq: Once | INTRAVENOUS | Status: AC
  Administered 2017-09-19: 15:00:00 via INTRAVENOUS

## 2017-09-19 MED ORDER — ARFORMOTEROL TARTRATE 15 MCG/2ML IN NEBU
15.0000 ug | INHALATION_SOLUTION | Freq: Two times a day (BID) | RESPIRATORY_TRACT | Status: DC
  Administered 2017-09-19 – 2017-09-23 (×9): 15 ug via RESPIRATORY_TRACT
  Filled 2017-09-19 (×8): qty 2

## 2017-09-19 MED ORDER — FUROSEMIDE 10 MG/ML IJ SOLN
40.0000 mg | Freq: Once | INTRAMUSCULAR | Status: AC
  Administered 2017-09-19: 40 mg via INTRAVENOUS
  Filled 2017-09-19: qty 4

## 2017-09-19 MED ORDER — FUROSEMIDE 10 MG/ML IJ SOLN
40.0000 mg | Freq: Two times a day (BID) | INTRAMUSCULAR | Status: DC
  Administered 2017-09-20 – 2017-09-23 (×8): 40 mg via INTRAVENOUS
  Filled 2017-09-19 (×8): qty 4

## 2017-09-19 MED ORDER — ACETYLCYSTEINE 20 % IN SOLN
4.0000 mL | Freq: Four times a day (QID) | RESPIRATORY_TRACT | Status: AC
  Administered 2017-09-20 (×4): 4 mL via RESPIRATORY_TRACT
  Filled 2017-09-19 (×4): qty 4

## 2017-09-19 MED ORDER — BUDESONIDE 0.5 MG/2ML IN SUSP
0.5000 mg | Freq: Two times a day (BID) | RESPIRATORY_TRACT | Status: DC
  Administered 2017-09-19 – 2017-09-23 (×9): 0.5 mg via RESPIRATORY_TRACT
  Filled 2017-09-19 (×9): qty 2

## 2017-09-19 MED ORDER — ALBUTEROL SULFATE (2.5 MG/3ML) 0.083% IN NEBU
3.0000 mL | INHALATION_SOLUTION | RESPIRATORY_TRACT | Status: DC | PRN
  Administered 2017-09-19 – 2017-09-20 (×6): 3 mL via RESPIRATORY_TRACT
  Filled 2017-09-19 (×6): qty 3

## 2017-09-19 MED ORDER — ACETYLCYSTEINE 20 % IN SOLN
4.0000 mL | Freq: Four times a day (QID) | RESPIRATORY_TRACT | Status: DC
  Administered 2017-09-19 (×2): 4 mL via RESPIRATORY_TRACT
  Filled 2017-09-19 (×2): qty 4

## 2017-09-19 NOTE — Progress Notes (Signed)
[facility] PROGRESS NOTE  [name] ZOX:[id number] DOB: [birth year] DOA: 09/16/2017  PCP: [name], MD  Brief History/Interval Summary: 61 year old Caucasian female with a past medical history of COPD who unfortunately continues to smoke, hyperlipidemia, duodenal ulcer, history of stroke and peripheral vascular disease presented to her primary care physician's office with a 2-week history of worsening shortness of breath.  She also had fever at home.  She was noted to be hypoxic with saturations in the 70s.  She was sent over to the hospital for further management.  She was placed on BiPAP in the emergency department.  Concern was for pneumonia versus pulmonary edema.  Reason for Visit: Acute hypoxic respiratory failure  Consultants: Pulmonology  Procedures:  Transthoracic echocardiogram Study Conclusions  - Left ventricle: The cavity size was normal. Wall thickness was   normal. Systolic function was normal. The estimated ejection   fraction was in the range of 55% to 60%. Wall motion was normal;   there were no regional wall motion abnormalities. There was no   evidence of elevated ventricular filling pressure by Doppler   parameters. - Right atrium: The atrium was mildly dilated. - Tricuspid valve: There was mild regurgitation. - Pulmonary arteries: Systolic pressure was mildly to moderately   increased. PA peak pressure: 46 mm Hg (S).  Impressions:  - No cardiac source of emboli was indentified.  Antibiotics: Ceftriaxone and azithromycin  Subjective/Interval History: Patient was taken off of BiPAP yesterday.  She has been on nonrebreather.  Patient states that her breathing is better however she feels very fatigued and tired.  Denies any chest pain.  Continues to have a cough.     ROS: Denies any nausea or vomiting  Objective:  Vital Signs  Vitals:   09/18/17 1941 09/18/17 2304 09/19/17 0351 09/19/17 0739  BP:  114/64 113/67 (!) 120/59    Pulse: 85 78 71 72  Resp: 17 19 17 19   Temp:  98 F (36.7 C) 97.8 F (36.6 C)   TempSrc:  Oral Oral   SpO2: 100% 100% 98% 99%  Weight:   45.6 kg (100 lb 8.5 oz)   Height:        Intake/Output Summary (Last 24 hours) at 09/19/2017 0759 Last data filed at 09/19/2017 0459 Gross per 24 hour  Intake -  Output 100 ml  Net -100 ml   Filed Weights   09/17/17 0800 09/18/17 0532 09/19/17 0351  Weight: 46.8 kg (103 lb 2.8 oz) 43.3 kg (95 lb 7.4 oz) 45.6 kg (100 lb 8.5 oz)    General appearance: Awake alert.  In no distress Resp: Improved air entry bilaterally.  Continues to have crackles bilaterally.  Few scattered wheezes.   Cardio: S1-S2 is normal regular.  No S3-S4.  No rubs murmurs or bruit GI: Soft.  Nontender nondistended.  Bowel sounds are present.  No masses organomegaly Extremities: No edema Neurologic: Awake alert.  No focal neurological deficits.  Lab Results:  Data Reviewed: I have personally reviewed following labs and imaging studies  CBC: Recent Labs  Lab 09/14/17 1056 09/16/17 1135 09/17/17 0417 09/18/17 0329 09/19/17 0206  WBC 5.5 10.5 6.9 5.9 8.5  NEUTROABS 3,740 9.3* 5.7  --   --   HGB 9.7* 9.8* 8.7* 8.0* 7.7*  HCT 30.4* 30.7* 27.0* 26.0* 25.0*  MCV 83.5 83.0 83.1 85.0 84.7  PLT 229 208 149* 189 185    Basic Metabolic Panel: Recent Labs  Lab 09/14/17 1056 09/16/17 1135 09/17/17 0417 09/18/17 0329 09/19/17 0206  NA 137 136 139 143 137  K 4.2 3.9 4.2 4.0 4.5  CL 107 107 112* 111 102  CO2 14* 19* 20* 25 30  GLUCOSE 94 87 93 113* 132*  BUN 17 18 16 16 17   CREATININE 0.85 0.68 0.47 0.48 0.35*  CALCIUM 8.5* 8.2* 7.7* 8.2* 8.5*  MG 1.9  --   --   --   --     GFR: Estimated Creatinine Clearance: 54.5 mL/min (A) (by C-G formula based on SCr of 0.35 mg/dL (L)).  Liver Function Tests: Recent Labs  Lab 09/14/17 1056 09/16/17 1135 09/17/17 0417  AST 18 37 36  ALT 6 10* 10*  ALKPHOS  --  89 137*  BILITOT 0.1* 0.3 <0.1*  PROT 7.2 6.5 5.4*   ALBUMIN  --  3.0* 2.5*     CBG: Recent Labs  Lab 09/16/17 1713  GLUCAP 111*     Radiology Studies: Dg Chest Portable 1 View  Result Date: 09/17/2017 CLINICAL DATA:  Pneumonia EXAM: PORTABLE CHEST 1 VIEW COMPARISON:  09/16/2017 FINDINGS: Diffuse bilateral airspace opacities are again noted, unchanged. Heart is upper limits normal in size. No visible effusions or pneumothorax. No acute bony abnormality. IMPRESSION: Severe diffuse bilateral airspace disease, not significantly changed. Electronically Signed   By: Charlett Nose M.D.   On: 09/17/2017 08:03     Medications:  Scheduled: . aspirin EC  81 mg Oral Daily  . chlorhexidine  15 mL Mouth Rinse BID  . docusate sodium  100 mg Oral BID  . enoxaparin (LOVENOX) injection  30 mg Subcutaneous Q24H  . ezetimibe  10 mg Oral Daily  . fluticasone furoate-vilanterol  1 puff Inhalation Daily  . guaiFENesin  600 mg Oral BID  . mouth rinse  15 mL Mouth Rinse q12n4p  . methylPREDNISolone (SOLU-MEDROL) injection  40 mg Intravenous Q8H  . nicotine  14 mg Transdermal Daily   Continuous: . azithromycin Stopped (09/18/17 1423)  . cefTRIAXone (ROCEPHIN)  IV Stopped (09/18/17 1219)   DGU:[id number] **OR** acetaminophen, albuterol, alum & mag hydroxide-simeth, morphine injection  Assessment/Plan:  Active Problems:   Pneumonia    Acute respiratory failure with hypoxia Patient appears to be stable.  Taken off of BiPAP yesterday.  Currently on a nonrebreather.  Try to wean down oxygen as much as possible.  Continue antibiotics and steroids.  Appreciate pulmonology input.     Bilateral pulmonary infiltrates Reason for her infiltrates is not entirely clear thought to be infectious.  Considering normal ejection fraction and only a mildly elevated BNP this is unlikely to be cardiac in etiology. Patient remains on ceftriaxone and azithromycin.  Pulmonology is following closely.  She was taken off of BiPAP yesterday and appears to be stable.  Respiratory viral panel is unremarkable.  If she does not improve she will need further workup including bronchoscopy.  ESR is noted to be elevated.  Pro-calcitonin is less than 0.5.  HIV is nonreactive.  May also need autoimmune workup.  EKG did not show any ischemic changes.   History of COPD with continued tobacco abuse Patient was counseled. BiPap as mentioned above.  Continue to monitor.  Continue home medications.  Continue steroids.  Iron Deficiency Anemia Hemoglobin is noted to be low compared to her usual baseline.  No evidence for overt bleeding currently.  Since it is possible that her anemia could also be causing some of her dyspnea.  She may benefit from blood transfusion.  We will order 1 unit of blood.  Anemia panel was  done recently which showed iron level to be 19 TIBC 478.  Ferritin was 28 in February. It appears that she had a colonoscopy in March 2017 which was unremarkable.  Upper endoscopy done at the same time showed multiple gastric and duodenal ulcers.  Capsule study was done in September 2017 which showed small erosions throughout small bowel.  Biopsies taken during endoscopy did not show any concerning findings on pathology.  This will need further evaluation as outpatient.  She has been seen by hematology previously, Dr. Pamelia Hoit.  Back pain Possibly positional.  Denies any pain this morning.  No history of falls or injuries recently.  No obvious focal neurological deficits appreciated.  Continue to monitor for now.    DVT Prophylaxis: Lovenox    Code Status: FullCode Family Communication: Discussed with the patient and her daughter Disposition Plan: She will be transfused 1 unit of blood.  Other management as outlined above.      LOS: 3 days   Osvaldo Shipper  [facility] Pager [phone] 09/19/2017, 7:59 AM  If 7PM-7AM, please contact night-coverage at [email or web address], password [facility]

## 2017-09-19 NOTE — Progress Notes (Signed)
PULMONARY / CRITICAL CARE MEDICINE   Name: [name] MRN: [id number] DOB: 09-Aug-1956    ADMISSION DATE:  09/16/2017   CHIEF COMPLAINT:  dyspnea  HISTORY OF PRESENT ILLNESS: This is a 61 year old chronic smoker who was treated as an outpatient for URI with azithromycin.  Her dyspnea progressed and on the morning of 1130 she presented to our department emergency medicine with saturations in the 70s.  She has been supported with BiPAP since that time and treated with empiric antibiotics for diffuse interstitial infiltrates.  We have no positive cultures, sed rate is 84, LDH is elevated, HIV is negative, and she has no history of immunosuppression.  Overnight she thinks that her dyspnea is subjectively a little bit better but she still has a cough and she is unable to clear her secretions.  Her BiPAP settings are essentially unchanged on 40% and 5/10   SUBJECTIVE:  No acute events.  Remains on NRB.  Feels like she has a lot of chest congestion but only coughing up minimal sputum.  VITAL SIGNS: BP (!) 120/59 (BP Location: Right Arm)   Pulse 72   Temp (!) 97 F (36.1 C) (Axillary)   Resp 19   Ht 5\' 1"  (1.549 m)   Wt 45.6 kg (100 lb 8.5 oz)   SpO2 99%   BMI 18.99 kg/m   HEMODYNAMICS:    VENTILATOR SETTINGS: FiO2 (%):  [100 %] 100 %  INTAKE / OUTPUT: I/O last 3 completed shifts: In: -  Out: 100 [Urine:100]  PHYSICAL EXAMINATION:  General: Somewhat frail appearing, in NAD. Cardiovascular: No JVD, no dependent edema.  RRR, no M/R/G. Lungs: Respirations are unlabored on NRB, coarse bilaterally rhonchi bilaterally. Abdomen: The abdomen is flat and soft without any organomegaly masses tenderness guarding or rebound.   LABS:  BMET Recent Labs  Lab 09/17/17 0417 09/18/17 0329 09/19/17 0206  NA 139 143 137  K 4.2 4.0 4.5  CL 112* 111 102  CO2 20* 25 30  BUN 16 16 17   CREATININE 0.47 0.48 0.35*  GLUCOSE 93 113* 132*    Electrolytes Recent Labs  Lab 09/14/17 1056   09/17/17 0417 09/18/17 0329 09/19/17 0206  CALCIUM 8.5*   < > 7.7* 8.2* 8.5*  MG 1.9  --   --   --   --    < > = values in this interval not displayed.    CBC Recent Labs  Lab 09/17/17 0417 09/18/17 0329 09/19/17 0206  WBC 6.9 5.9 8.5  HGB 8.7* 8.0* 7.7*  HCT 27.0* 26.0* 25.0*  PLT 149* 189 185    Coag's No results for input(s): APTT, INR in the last 168 hours.  Sepsis Markers Recent Labs  Lab 09/16/17 1156 09/16/17 1415 09/17/17 1632  LATICACIDVEN 1.35 0.64  --   PROCALCITON  --   --  0.48    ABG Recent Labs  Lab 09/16/17 1416  PHART 7.287*  PCO2ART 34.9  PO2ART 169.0*    Liver Enzymes Recent Labs  Lab 09/14/17 1056 09/16/17 1135 09/17/17 0417  AST 18 37 36  ALT 6 10* 10*  ALKPHOS  --  89 137*  BILITOT 0.1* 0.3 <0.1*  ALBUMIN  --  3.0* 2.5*    Cardiac Enzymes No results for input(s): TROPONINI, PROBNP in the last 168 hours.  Glucose Recent Labs  Lab 09/16/17 1713  GLUCAP 111*    Imaging No results found.   CULTURES: Blood 12/2 >  RVP 12/2 >  Urine Strep 11/30 > neg Urine  Legionella 11/30 >    ANTIBIOTICS: Rocephin 11/30 >  Azithromycin 11/30 >   STUDIES: Echo 12/1 > EF 55-60%, elevated ventricular filling pressures.   DISCUSSION: This is a 61 year old chronic smoker who presents with dyspnea and diffuse bilateral infiltrates.  We have no culture data to guide selection of antibiotics; therefore, she is being treated empirically for CAP coverage. Objectively she seems a little bit improved on her current regimen if she fails to show continued improvement and will consider bronchoscopy.  ASSESSMENT / PLAN:  Diffuse bilateral infiltrates with hypoxic respiratory failure -  etiology unknown, felt to be infectious (? CAP). Tobacco dependence. Plan: Continue empiric rocephin and azithromycin. Follow PCT. Continue steroids, BD's (switch Breo Ellipta inhaler to Budesonide / Brovana during acute illness). Continue supplemental O2  as needed to maintain SpO2 > 92%. BiPAP PRN. Lasix 40mg  now and assess response, goal neg balance (currently +2L). Start mucomyst nebs x 6 doses. Start manual chest PT and flutter valve. Continue guaifenesin. Might need bronch if no improvement (hopefully can avoid if above therapies able to help loosen secretions). Continue nicotine patch. Follow CXR.  Rest per primary team.   Rutherford Guys, PA - [facility] Pager: [phone]  or [phone] 09/19/2017, 10:52 AM  STAFF NOTE: I, Rory Percy, MD FACP have personally reviewed patient's available data, including medical history, events of note, physical examination and test results as part of my evaluation. I have discussed with resident/NP and other care providers such as pharmacist, RN and RRT. In addition, I personally evaluated patient and elicited key findings of: awake, alert, no increase WOB, jvd remains, coarse RONCHI bilateral, abdo soft, min edema, no rash, normal joints, pcxr I reviewed shows persistent infiltrates, CT I reviewed showed diffuse bilateral infiltrates, it is unclear the etiology, viral PNA vs noninfectious pneumonitis ( favor), do not favor bacterial infection but certainly needs ot be treated as such for now Would repeat pct, send auto immune panel with lack of progress slight increase steroids to 60 , escalate lasix slight, she remains on 100% NRB, and unable to bronch as would harm her and end up on vent, if she were to decline on own and require ett would bronch, would favor neg 1-2 liters neg balance goals, adding mucomysts , chets pt, I updated pt and daughter in room Mcarthur Rossetti. Tyson Alias, MD, FACP Pgr: [phone]  Pulmonary & Critical Care 09/19/2017 12:14 PM

## 2017-09-20 ENCOUNTER — Inpatient Hospital Stay (HOSPITAL_COMMUNITY): Payer: BLUE CROSS/BLUE SHIELD

## 2017-09-20 LAB — BASIC METABOLIC PANEL
ANION GAP: 12 (ref 5–15)
BUN: 16 mg/dL (ref 6–20)
CHLORIDE: 88 mmol/L — AB (ref 101–111)
CO2: 37 mmol/L — AB (ref 22–32)
Calcium: 8.5 mg/dL — ABNORMAL LOW (ref 8.9–10.3)
Creatinine, Ser: 0.4 mg/dL — ABNORMAL LOW (ref 0.44–1.00)
GFR calc non Af Amer: >60 mL/min (ref >60)
GLUCOSE: 199 mg/dL — AB (ref 65–99)
Potassium: 3.5 mmol/L (ref 3.5–5.1)
Sodium: 137 mmol/L (ref 135–145)

## 2017-09-20 LAB — CBC
HEMATOCRIT: 30.6 % — AB (ref 36.0–46.0)
HEMOGLOBIN: 9.4 g/dL — AB (ref 12.0–15.0)
MCH: 26.3 pg (ref 26.0–34.0)
MCHC: 30.7 g/dL (ref 30.0–36.0)
MCV: 85.5 fL (ref 78.0–100.0)
Platelets: 180 10*3/uL (ref 150–400)
RBC: 3.58 MIL/uL — ABNORMAL LOW (ref 3.87–5.11)
RDW: 17.2 % — AB (ref 11.5–15.5)
WBC: 8.8 10*3/uL (ref 4.0–10.5)

## 2017-09-20 LAB — MPO/PR-3 (ANCA) ANTIBODIES: ANCA Proteinase 3: <3.5 U/mL (ref 0.0–3.5)

## 2017-09-20 LAB — TYPE AND SCREEN
ABO/RH(D): B POS
Antibody Screen: NEGATIVE
Unit division: 0

## 2017-09-20 LAB — BPAM RBC
Blood Product Expiration Date: 201812172359
ISSUE DATE / TIME: 201812031441
Unit Type and Rh: 7300

## 2017-09-20 LAB — PROCALCITONIN: PROCALCITONIN: 0.1 ng/mL

## 2017-09-20 LAB — C4 COMPLEMENT: Complement C4, Body Fluid: 36 mg/dL (ref 14–44)

## 2017-09-20 LAB — SJOGRENS SYNDROME-A EXTRACTABLE NUCLEAR ANTIBODY

## 2017-09-20 LAB — ANCA TITERS: C-ANCA: <1:20 {titer}

## 2017-09-20 LAB — C3 COMPLEMENT: C3 COMPLEMENT: 175 mg/dL — AB (ref 82–167)

## 2017-09-20 LAB — ANA W/REFLEX IF POSITIVE: Anti Nuclear Antibody(ANA): NEGATIVE

## 2017-09-20 LAB — SJOGRENS SYNDROME-B EXTRACTABLE NUCLEAR ANTIBODY

## 2017-09-20 LAB — LEGIONELLA PNEUMOPHILA SEROGP 1 UR AG: L. pneumophila Serogp 1 Ur Ag: NEGATIVE

## 2017-09-20 MED ORDER — ORAL CARE MOUTH RINSE
15.0000 mL | Freq: Two times a day (BID) | OROMUCOSAL | Status: DC
  Administered 2017-09-20 – 2017-09-23 (×4): 15 mL via OROMUCOSAL

## 2017-09-20 NOTE — Progress Notes (Signed)
Hold on CPT pt felt nauseated

## 2017-09-20 NOTE — Progress Notes (Signed)
PULMONARY / CRITICAL CARE MEDICINE   Name: [name] MRN: [id number] DOB: 28-Sep-1956    ADMISSION DATE:  09/16/2017   CHIEF COMPLAINT:  dyspnea  HISTORY OF PRESENT ILLNESS: This is a 61 year old chronic smoker who was treated as an outpatient for URI with azithromycin.  Her dyspnea progressed and on the morning of 1130 she presented to our department emergency medicine with saturations in the 70s.  She has been supported with BiPAP since that time and treated with empiric antibiotics for diffuse interstitial infiltrates.  We have no positive cultures, sed rate is 84, LDH is elevated, HIV is negative, and she has no history of immunosuppression.  Overnight she thinks that her dyspnea is subjectively a little bit better but she still has a cough and she is unable to clear her secretions.  Her BiPAP settings are essentially unchanged on 40% and 5/10   SUBJECTIVE:  No acute events.  Coughing up sputum now, still thick.  Get a little nauseous with coughing.  Good UOP, 900 over past 24 hours.  Still room for more diuresis (currently +2.5L).  Now down to 2L O2 via [place].  VITAL SIGNS: BP 120/68 (BP Location: Right Arm)   Pulse 83   Temp 98.7 F (37.1 C) (Oral)   Resp 14   Ht 5\' 1"  (1.549 m)   Wt 43.9 kg (96 lb 12.5 oz)   SpO2 96%   BMI 18.29 kg/m   HEMODYNAMICS:    VENTILATOR SETTINGS: FiO2 (%):  [100 %] 100 %  INTAKE / OUTPUT: I/O last 3 completed shifts: In: 1345 [P.O.:360; Blood:385; IV Piggyback:600] Out: 1000 [Urine:1000]  PHYSICAL EXAMINATION:  General: Somewhat frail appearing, in NAD, appears better compared to 12/3 Cardiovascular: No JVD, no dependent edema.  RRR, no M/R/G. Lungs: Respirations are unlabored on 2L [place].  Faint rhonchi bases. Abdomen: The abdomen is flat and soft without any organomegaly masses tenderness guarding or rebound.   LABS:  BMET Recent Labs  Lab 09/18/17 0329 09/19/17 0206 09/20/17 0209  NA 143 137 137  K 4.0 4.5 3.5  CL 111 102 88*   CO2 25 30 37*  BUN 16 17 16   CREATININE 0.48 0.35* 0.40*  GLUCOSE 113* 132* 199*    Electrolytes Recent Labs  Lab 09/14/17 1056  09/18/17 0329 09/19/17 0206 09/20/17 0209  CALCIUM 8.5*   < > 8.2* 8.5* 8.5*  MG 1.9  --   --   --   --    < > = values in this interval not displayed.    CBC Recent Labs  Lab 09/18/17 0329 09/19/17 0206 09/20/17 0209  WBC 5.9 8.5 8.8  HGB 8.0* 7.7* 9.4*  HCT 26.0* 25.0* 30.6*  PLT 189 185 180    Coag's No results for input(s): APTT, INR in the last 168 hours.  Sepsis Markers Recent Labs  Lab 09/16/17 1156 09/16/17 1415 09/17/17 1632 09/19/17 1424 09/20/17 0209  LATICACIDVEN 1.35 0.64  --   --   --   PROCALCITON  --   --  0.48 0.12 0.10    ABG Recent Labs  Lab 09/16/17 1416  PHART 7.287*  PCO2ART 34.9  PO2ART 169.0*    Liver Enzymes Recent Labs  Lab 09/14/17 1056 09/16/17 1135 09/17/17 0417  AST 18 37 36  ALT 6 10* 10*  ALKPHOS  --  89 137*  BILITOT 0.1* 0.3 <0.1*  ALBUMIN  --  3.0* 2.5*    Cardiac Enzymes No results for input(s): TROPONINI, PROBNP in the last  168 hours.  Glucose Recent Labs  Lab 09/16/17 1713  GLUCAP 111*    Imaging Dg Chest Port 1 View  Result Date: 09/20/2017 CLINICAL DATA:  Shortness of breath, respiratory failure EXAM: PORTABLE CHEST 1 VIEW COMPARISON:  09/17/2017 FINDINGS: Severe diffuse bilateral airspace disease again noted, unchanged. Cardiomegaly. No visible effusions or acute bony abnormality. IMPRESSION: Stable severe bilateral airspace disease. Cardiomegaly. Electronically Signed   By: Charlett Nose M.D.   On: 09/20/2017 08:44     CULTURES: Blood 12/2 >  RVP 12/2 >  Urine Strep 11/30 > neg Urine Legionella 11/30 >    ANTIBIOTICS: Rocephin 11/30 >  Azithromycin 11/30 >   STUDIES: Echo 12/1 > EF 55-60%, elevated ventricular filling pressures. CXR 12/4 > severe bilateral airspace disease.   DISCUSSION: This is a 61 year old chronic smoker who presents with dyspnea  and diffuse bilateral infiltrates.  We have no culture data to guide selection of antibiotics; therefore, she is being treated empirically for CAP coverage. Objectively she seems a little bit improved on her current regimen if she fails to show continued improvement and will consider bronchoscopy.    12/4, much improved.  Down to 2L [place].  Diuresing well, 900cc in past 24 hours.   ASSESSMENT / PLAN:  Diffuse bilateral infiltrates with hypoxic respiratory failure -  etiology unknown, felt to be infectious (? CAP). Tobacco dependence. Plan: Continue empiric rocephin and azithromycin. Follow PCT. Continue steroids, BD's (switch Breo Ellipta inhaler to Budesonide / Brovana during acute illness). Continue supplemental O2 as needed to maintain SpO2 > 92%. Once more improved, then do ambulatory desat study to assess for home O2 needs (no point in doing now as will surely desaturate, wait until further diuresis and O2 weaned more). BiPAP PRN - has not needed. Lasix 40mg  BID, push for negative balance. Continue mucomyst nebs x 6 total doses. Start manual chest PT and flutter valve. Continue guaifenesin. Should be able to avoid need for bronch (would need intubation if so; therefore, aggressively trying to avoid this.  Fortunately, she is making progress with above plans). OK to eat since no bronch planned. Continue nicotine patch. Follow CXR intermittently.  Rest per primary team.   Rutherford Guys, PA - C  Pulmonary [facility] Critical Care Medicine Pager: [phone]  or [phone] 09/20/2017, 10:47 AM   STAFF NOTE: I, Brenda Percy, MD FACP have personally reviewed patient's available data, including medical history, events of note, physical examination and test results as part of my evaluation. I have discussed with resident/NP and other care providers such as pharmacist, RN and RRT. In addition, I personally evaluated patient and elicited key findings of: Improved status, jvd  improved was elevated, less coarse BS for sure, abdo soft, NO labored breathing, she was neg 1 liter with lasix and renal fucntion did very well with this, pcxr is improved which I reviewed and shows fluid fissure and improved int changes bilaterally, I think the combiation of steroids, lasix and abx ha simproved her but with the fluid in fissure it appears that lasix has improved her as well, now ON 2 liters from 100% nrb, she is sats 97% now, in am if continued to improve would reduce steroids to pred 40 bid, BNP was eleavted which supports need lasix to maintain this, auto immmune work up is neg , ESR was eleavted, pneumonitis?, plan is to ambulate and check pulse ox in am if Off O2, repeat pcxr, repeat chem in am with same lasix  dosing, pct is neg x last 2, dc ctx, maintain atypical azithro x 5 days  [name]. Brenda Alias, MD, FACP Pgr: [phone] [place] [facility] 09/20/2017 11:51 AM

## 2017-09-20 NOTE — Progress Notes (Signed)
[facility] PROGRESS NOTE  [name] NUU:[id number] DOB: 11-03-1955 DOA: 09/16/2017  PCP: [name], MD  Brief History/Interval Summary: 61 year old Caucasian female with a past medical history of COPD who unfortunately continues to smoke, hyperlipidemia, duodenal ulcer, history of stroke and peripheral vascular disease presented to her primary care physician's office with a 2-week history of worsening shortness of breath.  She also had fever at home.  She was noted to be hypoxic with saturations in the 70s.  She was sent over to the hospital for further management.  She was placed on BiPAP in the emergency department.  Concern was for pneumonia versus pulmonary edema.  Neurology was consulted.  Patient was given steroids antibiotics and diuretics.  She was slow to improve.  Reason for Visit: Acute hypoxic respiratory failure  Consultants: Pulmonology  Procedures:  Transthoracic echocardiogram Study Conclusions  - Left ventricle: The cavity size was normal. Wall thickness was   normal. Systolic function was normal. The estimated ejection   fraction was in the range of 55% to 60%. Wall motion was normal;   there were no regional wall motion abnormalities. There was no   evidence of elevated ventricular filling pressure by Doppler   parameters. - Right atrium: The atrium was mildly dilated. - Tricuspid valve: There was mild regurgitation. - Pulmonary arteries: Systolic pressure was mildly to moderately   increased. PA peak pressure: 46 mm Hg (S). Impressions: - No cardiac source of emboli was indentified.  Antibiotics: Ceftriaxone and azithromycin  Subjective/Interval History: Patient has been off of BiPAP for 24 hours.  She seems to be improving.  She feels much better.  Continues to have a cough which is dry for the most part.  Denies any chest pain.  No headaches.  Back pain has improved.  ROS: Denies any nausea or vomiting  Objective:  Vital  Signs  Vitals:   09/20/17 0332 09/20/17 0730 09/20/17 0801 09/20/17 0805  BP: 102/71  120/68   Pulse: 89  83   Resp: 17  14   Temp: 97.9 F (36.6 C)  98.7 F (37.1 C)   TempSrc: Oral  Oral   SpO2: 97% 99% 100% 96%  Weight: 43.9 kg (96 lb 12.5 oz)     Height:        Intake/Output Summary (Last 24 hours) at 09/20/2017 0917 Last data filed at 09/20/2017 0400 Gross per 24 hour  Intake 1345 ml  Output 900 ml  Net 445 ml   Filed Weights   09/18/17 0532 09/19/17 0351 09/20/17 0332  Weight: 43.3 kg (95 lb 7.4 oz) 45.6 kg (100 lb 8.5 oz) 43.9 kg (96 lb 12.5 oz)    General appearance: Awake alert.  In no distress. Resp: Improved air entry bilaterally.  Continues to have crackles.  No wheezing heard today.    Cardio: S1-S2 is normal regular.  No S3-S4.  No rubs murmurs or bruit GI: Abdomen remains soft.  Nontender nondistended.   Extremities: No edema Neurologic: No focal neurological deficits.  Lab Results:  Data Reviewed: I have personally reviewed following labs and imaging studies  CBC: Recent Labs  Lab 09/14/17 1056 09/16/17 1135 09/17/17 0417 09/18/17 0329 09/19/17 0206 09/20/17 0209  WBC 5.5 10.5 6.9 5.9 8.5 8.8  NEUTROABS 3,740 9.3* 5.7  --   --   --   HGB 9.7* 9.8* 8.7* 8.0* 7.7* 9.4*  HCT 30.4* 30.7* 27.0* 26.0* 25.0* 30.6*  MCV 83.5 83.0 83.1 85.0 84.7 85.5  PLT 229 208 149* 189  185 180    Basic Metabolic Panel: Recent Labs  Lab 09/14/17 1056 09/16/17 1135 09/17/17 0417 09/18/17 0329 09/19/17 0206 09/20/17 0209  NA 137 136 139 143 137 137  K 4.2 3.9 4.2 4.0 4.5 3.5  CL 107 107 112* 111 102 88*  CO2 14* 19* 20* 25 30 37*  GLUCOSE 94 87 93 113* 132* 199*  BUN 17 18 16 16 17 16   CREATININE 0.85 0.68 0.47 0.48 0.35* 0.40*  CALCIUM 8.5* 8.2* 7.7* 8.2* 8.5* 8.5*  MG 1.9  --   --   --   --   --     GFR: Estimated Creatinine Clearance: 52.5 mL/min (A) (by C-G formula based on SCr of 0.4 mg/dL (L)).  Liver Function Tests: Recent Labs  Lab  09/14/17 1056 09/16/17 1135 09/17/17 0417  AST 18 37 36  ALT 6 10* 10*  ALKPHOS  --  89 137*  BILITOT 0.1* 0.3 <0.1*  PROT 7.2 6.5 5.4*  ALBUMIN  --  3.0* 2.5*     CBG: Recent Labs  Lab 09/16/17 1713  GLUCAP 111*     Radiology Studies: Dg Chest Port 1 View  Result Date: 09/20/2017 CLINICAL DATA:  Shortness of breath, respiratory failure EXAM: PORTABLE CHEST 1 VIEW COMPARISON:  09/17/2017 FINDINGS: Severe diffuse bilateral airspace disease again noted, unchanged. Cardiomegaly. No visible effusions or acute bony abnormality. IMPRESSION: Stable severe bilateral airspace disease. Cardiomegaly. Electronically Signed   By: Charlett Nose M.D.   On: 09/20/2017 08:44     Medications:  Scheduled: . acetylcysteine  4 mL Nebulization Q6H  . arformoterol  15 mcg Nebulization BID  . aspirin EC  81 mg Oral Daily  . budesonide (PULMICORT) nebulizer solution  0.5 mg Nebulization BID  . chlorhexidine  15 mL Mouth Rinse BID  . docusate sodium  100 mg Oral BID  . enoxaparin (LOVENOX) injection  30 mg Subcutaneous Q24H  . ezetimibe  10 mg Oral Daily  . furosemide  40 mg Intravenous Q12H  . guaiFENesin  600 mg Oral BID  . mouth rinse  15 mL Mouth Rinse q12n4p  . methylPREDNISolone (SOLU-MEDROL) injection  60 mg Intravenous Q8H  . nicotine  14 mg Transdermal Daily   Continuous: . azithromycin Stopped (09/19/17 1208)  . cefTRIAXone (ROCEPHIN)  IV Stopped (09/19/17 1411)   ZOX:[id number] **OR** acetaminophen, albuterol, alum & mag hydroxide-simeth, morphine injection  Assessment/Plan:  Active Problems:   Community acquired pneumonia   Acute respiratory failure with hypoxia (HCC)   Acute respiratory failure (HCC)    Acute respiratory failure with hypoxia Patient is slowly improving.  She has been off of BiPAP for more than 24 hours.  And now is off nonrebreather as well.  She is on oxygen by nasal cannula.  Continue antibiotics, steroids and diuretics.  Pulmonology is following  and we appreciate their input.     Bilateral pulmonary infiltrates Reason for her infiltrates is not entirely clear.  She is found to have normal ejection fraction on echocardiogram.  Her BNP was only mildly elevated.  However patient appears to have shown some improvement with diuresis.  She is also on antibiotics which will be continued.  She is on ceftriaxone and azithromycin.  She is also on steroids. Respiratory viral panel is unremarkable.  ESR is noted to be elevated.  Autoimmune workup in progress.  Pro-calcitonin is less than 0.5.  HIV is nonreactive.  EKG did not show any ischemic changes.   History of COPD with continued tobacco abuse  Patient was counseled. Continue to monitor.  Continue home medications.  Continue steroids.  Iron Deficiency Anemia Hemoglobin is noted to be low compared to her usual baseline.  No evidence for overt bleeding currently.  Anemia could have contributed to her dyspnea and so she was transfused 1 unit of blood on 12/3.  Hemoglobin has responded appropriately.  Continue to monitor for now.  Anemia panel was done recently which showed iron level to be 19 TIBC 478.  Ferritin was 28 in February. It appears that she had a colonoscopy in March 2017 which was unremarkable.  Upper endoscopy done at the same time showed multiple gastric and duodenal ulcers.  Capsule study was done in September 2017 which showed small erosions throughout small bowel.  Biopsies taken during endoscopy did not show any concerning findings on pathology.  This will need further evaluation as outpatient.  She has been seen by hematology previously, Dr. Pamelia Hoit.  Back pain Possibly positional.  Denies any pain currently.  No recent falls or injuries.  No focal neurological deficits.  Continue to monitor.  Mobilize.     Hyperglycemia Due to steroids.  Continue to monitor.  DVT Prophylaxis: Lovenox    Code Status: FullCode Family Communication: Discussed with the patient Disposition Plan:  Management as outlined above.  Possible transfer to floor later today.    LOS: 4 days   Osvaldo Shipper  [facility] Pager [phone] 09/20/2017, 9:17 AM  If 7PM-7AM, please contact night-coverage at [email or web address], password [facility]

## 2017-09-21 ENCOUNTER — Inpatient Hospital Stay (HOSPITAL_COMMUNITY): Payer: BLUE CROSS/BLUE SHIELD

## 2017-09-21 DIAGNOSIS — J9692 Respiratory failure, unspecified with hypercapnia: Secondary | ICD-10-CM

## 2017-09-21 LAB — CULTURE, BLOOD (ROUTINE X 2)
Culture: NO GROWTH
Culture: NO GROWTH
Special Requests: ADEQUATE

## 2017-09-21 LAB — BASIC METABOLIC PANEL
ANION GAP: 16 — AB (ref 5–15)
BUN: 18 mg/dL (ref 6–20)
CO2: 34 mmol/L — AB (ref 22–32)
Calcium: 9.2 mg/dL (ref 8.9–10.3)
Chloride: 86 mmol/L — ABNORMAL LOW (ref 101–111)
Creatinine, Ser: 0.46 mg/dL (ref 0.44–1.00)
GFR calc Af Amer: >60 mL/min (ref >60)
GFR calc non Af Amer: >60 mL/min (ref >60)
GLUCOSE: 134 mg/dL — AB (ref 65–99)
Potassium: 3.3 mmol/L — ABNORMAL LOW (ref 3.5–5.1)
Sodium: 136 mmol/L (ref 135–145)

## 2017-09-21 LAB — CBC WITH DIFFERENTIAL/PLATELET
Basophils Absolute: 0 10*3/uL (ref 0.0–0.1)
Basophils Relative: 0 %
Eosinophils Absolute: 0 10*3/uL (ref 0.0–0.7)
Eosinophils Relative: 0 %
HEMATOCRIT: 33.4 % — AB (ref 36.0–46.0)
HEMOGLOBIN: 10.2 g/dL — AB (ref 12.0–15.0)
LYMPHS ABS: 0.8 10*3/uL (ref 0.7–4.0)
LYMPHS PCT: 8 %
MCH: 26 pg (ref 26.0–34.0)
MCHC: 30.5 g/dL (ref 30.0–36.0)
MCV: 85 fL (ref 78.0–100.0)
MONOS PCT: 6 %
Monocytes Absolute: 0.6 10*3/uL (ref 0.1–1.0)
NEUTROS ABS: 8.7 10*3/uL — AB (ref 1.7–7.7)
NEUTROS PCT: 86 %
Platelets: 208 10*3/uL (ref 150–400)
RBC: 3.93 MIL/uL (ref 3.87–5.11)
RDW: 16.9 % — ABNORMAL HIGH (ref 11.5–15.5)
WBC: 10.1 10*3/uL (ref 4.0–10.5)

## 2017-09-21 LAB — MAGNESIUM: Magnesium: 1.7 mg/dL (ref 1.7–2.4)

## 2017-09-21 LAB — PHOSPHORUS: Phosphorus: 1.5 mg/dL — ABNORMAL LOW (ref 2.5–4.6)

## 2017-09-21 MED ORDER — PREDNISONE 20 MG PO TABS
40.0000 mg | ORAL_TABLET | Freq: Two times a day (BID) | ORAL | Status: DC
  Administered 2017-09-21 – 2017-09-22 (×3): 40 mg via ORAL
  Filled 2017-09-21 (×2): qty 2

## 2017-09-21 NOTE — Evaluation (Signed)
Physical Therapy Evaluation Patient Details Name: [name] MRN: [id number] DOB: [birth year] Today's Date: 09/21/2017   History of Present Illness  Pt is a 61 y.o. female presented to ED on 09/16/17 with dyspnea and saturations in the 70s. Found to have diffuse bilateral infiltrates with hypoxic respiratory failure (etiology unknown, felt to be infectious ?CAP). Pertinent PMH includes COPD, tobacco use, HTN, HLD, carotid stenosis (s/p CEA 2016), anemia.     Clinical Impression  Pt presents with decreased activity tolerance and an overall decrease in functional mobility secondary to above. PTA, pt indep and lives at home with husband who works during the day. Today, pt able to transfer and amb 150' with RW and min guard for balance. SpO2 down to 86% on 1L O2 [place], returning to >90% with rest and deep breathing. Encouraged pt to continue ambulating with assist from nursing staff (RN aware). Pt would benefit from continued acute PT services to maximize functional mobility and independence prior to d/c with HHPT.     Follow Up Recommendations Home health PT;Supervision - Intermittent    Equipment Recommendations  None recommended by PT    Recommendations for Other Services OT consult     Precautions / Restrictions Precautions Precautions: Fall Restrictions Weight Bearing Restrictions: No      Mobility  Bed Mobility               General bed mobility comments: Received using BSC with assist from daughter and then sitting EOB; daughter reports pt did not require physical assist  Transfers Overall transfer level: Needs assistance Equipment used: Rolling walker (2 wheeled) Transfers: Sit to/from Stand Sit to Stand: Min guard         General transfer comment: Min guard for balance. Educ on hand placement with RW  Ambulation/Gait Ambulation/Gait assistance: Min guard Ambulation Distance (Feet): 150 Feet Assistive device: Rolling walker (2 wheeled) Gait  Pattern/deviations: Step-through pattern;Decreased stride length;Trunk flexed Gait velocity: Decreased Gait velocity interpretation: <1.8 ft/sec, indicative of risk for recurrent falls General Gait Details: Slow, slightly unsteady amb with RW and intermittent min guard for balance. 2x standing rest breaks secondary to fatigue. SpO2 down to 86% on 1L O2 [place], returning to >90% with rest break and deep breathing  Stairs            Wheelchair Mobility    Modified Rankin (Stroke Patients Only)       Balance Overall balance assessment: Needs assistance   Sitting balance-Leahy Scale: Good       Standing balance-Leahy Scale: Fair Standing balance comment: Can static stand with no UE support; dynamic stability improved with BUE support                             Pertinent Vitals/Pain Pain Assessment: No/denies pain    Home Living Family/patient expects to be discharged to:: Private residence Living Arrangements: Spouse/significant other Available Help at Discharge: Family;Available PRN/intermittently(Husband works during day) Type of Home: House Home Access: Stairs to enter Entrance Stairs-Rails: Doctor, general practice of Steps: 2 Home Layout: One level Home Equipment: Environmental consultant - 4 wheels;Cane - single point      Prior Function Level of Independence: Independent         Comments: Does not use home O2     Hand Dominance   Dominant Hand: Right    Extremity/Trunk Assessment   Upper Extremity Assessment Upper Extremity Assessment: Generalized weakness    Lower Extremity Assessment Lower Extremity Assessment:  Generalized weakness    Cervical / Trunk Assessment Cervical / Trunk Assessment: Kyphotic  Communication   Communication: No difficulties  Cognition Arousal/Alertness: Awake/alert Behavior During Therapy: WFL for tasks assessed/performed Overall Cognitive Status: Within Functional Limits for tasks assessed                                         General Comments General comments (skin integrity, edema, etc.): Daughter present throughout session and very supportive    Exercises     Assessment/Plan    PT Assessment Patient needs continued PT services  PT Problem List Decreased strength;Decreased activity tolerance;Decreased balance;Decreased mobility;Cardiopulmonary status limiting activity;Decreased knowledge of use of DME       PT Treatment Interventions DME instruction;Gait training;Stair training;Functional mobility training;Therapeutic activities;Therapeutic exercise;Balance training;Patient/family education    PT Goals (Current goals can be found in the Care Plan section)  Acute Rehab PT Goals Patient Stated Goal: Return home without supplemental O2 PT Goal Formulation: With patient Time For Goal Achievement: 10/05/17 Potential to Achieve Goals: Good    Frequency Min 3X/week   Barriers to discharge        Co-evaluation               AM-PAC PT "6 Clicks" Daily Activity  Outcome Measure Difficulty turning over in bed (including adjusting bedclothes, sheets and blankets)?: A Little Difficulty moving from lying on back to sitting on the side of the bed? : A Little Difficulty sitting down on and standing up from a chair with arms (e.g., wheelchair, bedside commode, etc,.)?: A Little Help needed moving to and from a bed to chair (including a wheelchair)?: A Little Help needed walking in hospital room?: A Little Help needed climbing 3-5 steps with a railing? : A Little 6 Click Score: 18    End of Session Equipment Utilized During Treatment: Gait belt;Oxygen Activity Tolerance: Patient tolerated treatment well Patient left: in bed;with call bell/phone within reach;with family/visitor present(sitting EOB) Nurse Communication: Mobility status PT Visit Diagnosis: Other abnormalities of gait and mobility (R26.89);Muscle weakness (generalized) (M62.81)    Time: 1001-1026 PT Time  Calculation (min) (ACUTE ONLY): 25 min   Charges:   PT Evaluation $PT Eval Moderate Complexity: 1 Mod PT Treatments $Gait Training: 8-22 mins   PT G Codes:       Ina Homes, PT, DPT Acute Rehab Services  Pager: [phone]  Malachy Chamber 09/21/2017, 10:42 AM

## 2017-09-21 NOTE — Progress Notes (Signed)
PULMONARY / CRITICAL CARE MEDICINE   Name: [name] MRN: [id number] DOB: December 25, 1955    ADMISSION DATE:  09/16/2017   CHIEF COMPLAINT:  dyspnea  HISTORY OF PRESENT ILLNESS: This is a 61 year old chronic smoker who was treated as an outpatient for URI with azithromycin.  Her dyspnea progressed and on the morning of 1130 she presented to our department emergency medicine with saturations in the 70s.  She has been supported with BiPAP since that time and treated with empiric antibiotics for diffuse interstitial infiltrates.  We have no positive cultures, sed rate is 84, LDH is elevated, HIV is negative, and she has no history of immunosuppression.  Overnight she thinks that her dyspnea is subjectively a little bit better but she still has a cough and she is unable to clear her secretions.  Her BiPAP settings are essentially unchanged on 40% and 5/10   SUBJECTIVE:  Cough Improved O2 needs pcxr daily better with neg balance Neg 1.1 liters last 24 hours   VITAL SIGNS: BP 121/67 (BP Location: Right Arm)   Pulse 93   Temp 98.3 F (36.8 C) (Oral)   Resp 15   Ht 5\' 1"  (1.549 m)   Wt 43.2 kg (95 lb 3.8 oz)   SpO2 94%   BMI 18.00 kg/m   HEMODYNAMICS:    VENTILATOR SETTINGS:    INTAKE / OUTPUT: I/O last 3 completed shifts: In: 800 [P.O.:800] Out: 2500 [Urine:2500]  PHYSICAL EXAMINATION:  General: awake, on low O2 Neuro: awake, nonfocal HEENT: jvd wnl PULM: improved coarse CV: s1 s2 RRR no  GI: soft, Bs wnl, no  Extremities: no edema   LABS:  BMET Recent Labs  Lab 09/19/17 0206 09/20/17 0209 09/21/17 0233  NA 137 137 136  K 4.5 3.5 3.3*  CL 102 88* 86*  CO2 30 37* 34*  BUN 17 16 18   CREATININE 0.35* 0.40* 0.46  GLUCOSE 132* 199* 134*    Electrolytes Recent Labs  Lab 09/19/17 0206 09/20/17 0209 09/21/17 0233  CALCIUM 8.5* 8.5* 9.2  MG  --   --  1.7  PHOS  --   --  1.5*    CBC Recent Labs  Lab 09/19/17 0206 09/20/17 0209 09/21/17 0233  WBC  8.5 8.8 10.1  HGB 7.7* 9.4* 10.2*  HCT 25.0* 30.6* 33.4*  PLT 185 180 208    Coag's No results for input(s): APTT, INR in the last 168 hours.  Sepsis Markers Recent Labs  Lab 09/16/17 1156 09/16/17 1415 09/17/17 1632 09/19/17 1424 09/20/17 0209  LATICACIDVEN 1.35 0.64  --   --   --   PROCALCITON  --   --  0.48 0.12 0.10    ABG Recent Labs  Lab 09/16/17 1416  PHART 7.287*  PCO2ART 34.9  PO2ART 169.0*    Liver Enzymes Recent Labs  Lab 09/16/17 1135 09/17/17 0417  AST 37 36  ALT 10* 10*  ALKPHOS 89 137*  BILITOT 0.3 <0.1*  ALBUMIN 3.0* 2.5*    Cardiac Enzymes No results for input(s): TROPONINI, PROBNP in the last 168 hours.  Glucose Recent Labs  Lab 09/16/17 1713  GLUCAP 111*    Imaging Dg Chest Port 1 View  Result Date: 09/21/2017 CLINICAL DATA:  Shortness of Breath EXAM: PORTABLE CHEST 1 VIEW COMPARISON:  09/20/2017 FINDINGS: Cardiac shadow is within normal limits. The lungs demonstrate mild changes of pulmonary edema although improved when compared with the prior exam. No focal confluent infiltrate is seen. Old rib fractures are noted on the left.  No new focal abnormality is seen. IMPRESSION: Improving bilateral pulmonary edema. Electronically Signed   By: Alcide Clever M.D.   On: 09/21/2017 08:57     CULTURES: Blood 12/2 >  RVP 12/2 > neg Urine Strep 11/30 > neg Urine Legionella 11/30 >  neg  ANTIBIOTICS: Rocephin 11/30 >  Azithromycin 11/30 >   STUDIES: Echo 12/1 > EF 55-60%, elevated ventricular filling pressures. CXR 12/4 > severe bilateral airspace disease.   DISCUSSION: This is a 61 year old chronic smoker who presents with dyspnea and diffuse bilateral infiltrates.  We have no culture data to guide selection of antibiotics; therefore, she is being treated empirically for CAP coverage. Objectively she seems a little bit improved on her current regimen if she fails to show continued improvement and will consider bronchoscopy.    12/4,  much improved.  Down to 2L [place].  Diuresing well, 900cc in past 24 hours.   ASSESSMENT / PLAN:  Diffuse bilateral infiltrates with hypoxic respiratory failure -  etiology unknown Infection (atypical?) vs non infectious  Seems to have a component of non cardiogenic edema Tobacco dependence. hypokalemia Plan: Again neg 1.1 liter and improved status Remains on low O2 pcxr Which I reviewed shows continued resolving int infiltrates and slight fluid rt fissure What seems to have improved her the most is lasis, non cardiogenix? Edema component of atypical illness? Would treat azithro x 5 days and ctx  (or beta lac) x 7 days would maintain lasix to neg balance similar goals of last few days Would change steroids to oral pred 40 bid and lower this to dc dose 20 mg daily over 1-2 days but then have follow up with pulm with esr and pcxr in 1 week, with plan to wean pred to off over 1 week, as likely this represents a pneumonitis of unclear cause Allow to dc mucomyst and chest pt supp k especially with lasix needs  Should follow up in pulm office Will sign off ,call if needed  Mcarthur Rossetti. Tyson Alias, MD, FACP Pgr: [phone] [facility] 09/21/2017 2:01 PM

## 2017-09-21 NOTE — Evaluation (Signed)
Occupational Therapy Evaluation Patient Details Name: [name] MRN: [id number] DOB: 05/31/1956 Today's Date: 09/21/2017    History of Present Illness Pt is a 61 y.o. female presented to ED on 09/16/17 with dyspnea and saturations in the 70s. Found to have diffuse bilateral infiltrates with hypoxic respiratory failure (etiology unknown, felt to be infectious ?CAP). Pertinent PMH includes COPD, tobacco use, HTN, HLD, carotid stenosis (s/p CEA 2016), anemia.    Clinical Impression   Pt admitted with above. She demonstrates the below listed deficits and will benefit from continued OT to maximize safety and independence with BADLs.  Pt presents to OT with generalized weakness, decreased activity tolerance, decreased balance.  She requires min guard assist for ADL, and multiple rest breaks.  She lives with spouse who works during the day, and was fully independent PTA.       Follow Up Recommendations  No OT follow up;Supervision - Intermittent    Equipment Recommendations  None recommended by OT    Recommendations for Other Services       Precautions / Restrictions Precautions Precautions: Fall      Mobility Bed Mobility Overal bed mobility: Modified Independent                Transfers Overall transfer level: Needs assistance Equipment used: Rolling walker (2 wheeled) Transfers: Sit to/from [employer] Sit to Stand: Min guard Stand pivot transfers: Min guard            Balance Overall balance assessment: Needs assistance   Sitting balance-Leahy Scale: Good       Standing balance-Leahy Scale: Fair Standing balance comment: can maintain static standing with min guard assist                            ADL either performed or assessed with clinical judgement   ADL Overall ADL's : Needs assistance/impaired Eating/Feeding: Independent;Sitting   Grooming: Wash/dry hands;Wash/dry face;Oral care;Brushing hair;Min guard;Standing    Upper Body Bathing: Set up;Sitting   Lower Body Bathing: Min guard;Sit to/from stand   Upper Body Dressing : Set up;Sitting   Lower Body Dressing: Min guard;Sit to/from stand   Toilet Transfer: Min guard;Ambulation;Comfort height toilet;RW;Grab bars   Toileting- Clothing Manipulation and Hygiene: Min guard;Sit to/from stand       Functional mobility during ADLs: Hydrographic surveyor     Praxis      Pertinent Vitals/Pain Pain Assessment: No/denies pain     Hand Dominance Right   Extremity/Trunk Assessment Upper Extremity Assessment Upper Extremity Assessment: Generalized weakness   Lower Extremity Assessment Lower Extremity Assessment: Generalized weakness   Cervical / Trunk Assessment Cervical / Trunk Assessment: Kyphotic   Communication Communication Communication: No difficulties   Cognition Arousal/Alertness: Awake/alert Behavior During Therapy: WFL for tasks assessed/performed Overall Cognitive Status: Within Functional Limits for tasks assessed                                     General Comments  VSS on 1L supplemental 02.  reviewed energy conservation techniques     Exercises     Shoulder Instructions      Home Living Family/patient expects to be discharged to:: Private residence Living Arrangements: Spouse/significant other Available Help at Discharge: Family;Available PRN/intermittently Type of Home: House Home Access: Stairs to  enter Entrance Stairs-Number of Steps: 2 Entrance Stairs-Rails: Right;Left Home Layout: One level     Bathroom Shower/Tub: Producer, television/film/video: Standard     Home Equipment: Environmental consultant - 4 wheels;Cane - single point;Bedside commode          Prior Functioning/Environment Level of Independence: Independent        Comments: Does not use home O2        OT Problem List: Decreased strength;Decreased activity tolerance;Impaired balance  (sitting and/or standing);Decreased safety awareness;Decreased knowledge of use of DME or AE;Cardiopulmonary status limiting activity      OT Treatment/Interventions: Self-care/ADL training;Energy conservation;DME and/or AE instruction;Therapeutic activities;Patient/family education;Balance training    OT Goals(Current goals can be found in the care plan section) Acute Rehab OT Goals Patient Stated Goal: go home  OT Goal Formulation: With patient Time For Goal Achievement: 10/05/17 Potential to Achieve Goals: Good ADL Goals Pt Will Perform Grooming: with modified independence;standing Pt Will Perform Upper Body Bathing: with modified independence;standing Pt Will Perform Lower Body Bathing: with modified independence;sit to/from stand Pt Will Perform Upper Body Dressing: with modified independence;sitting Pt Will Perform Lower Body Dressing: with modified independence;sit to/from stand Pt Will Transfer to Toilet: with modified independence;ambulating;regular height toilet;bedside commode;grab bars Pt Will Perform Toileting - Clothing Manipulation and hygiene: with modified independence;sit to/from stand Pt Will Perform Tub/Shower Transfer: Shower transfer;with supervision;ambulating;3 in 1;rolling walker Additional ADL Goal #1: Pt will be independent with energy conservation techniques  OT Frequency: Min 2X/week   Barriers to D/C:            Co-evaluation              AM-PAC PT "6 Clicks" Daily Activity     Outcome Measure Help from another person eating meals?: None Help from another person taking care of personal grooming?: A Little Help from another person toileting, which includes using toliet, bedpan, or urinal?: A Little Help from another person bathing (including washing, rinsing, drying)?: A Little Help from another person to put on and taking off regular upper body clothing?: A Little Help from another person to put on and taking off regular lower body clothing?: A  Little 6 Click Score: 19   End of Session Equipment Utilized During Treatment: Rolling walker;Oxygen Nurse Communication: Mobility status  Activity Tolerance: Patient tolerated treatment well Patient left: in bed;with call bell/phone within reach  OT Visit Diagnosis: Unsteadiness on feet (R26.81)                Time: 4782-9562 OT Time Calculation (min): 25 min Charges:  OT General Charges $OT Visit: 1 Visit OT Evaluation $OT Eval Moderate Complexity: 1 Mod OT Treatments $Self Care/Home Management : 8-22 mins G-Codes:     [employer], OTR/L [phone]   Jeani Hawking M 09/21/2017, 3:28 PM

## 2017-09-21 NOTE — Progress Notes (Signed)
[facility] PROGRESS NOTE  [name]:[id number] DOB: 1956/08/25 DOA: 09/16/2017  PCP: [name], MD  Brief History/Interval Summary: 61 year old Caucasian female with a past medical history of COPD who unfortunately continues to smoke, hyperlipidemia, duodenal ulcer, history of stroke and peripheral vascular disease presented to her primary care physician's office with a 2-week history of worsening shortness of breath.  She also had fever at home.  She was noted to be hypoxic with saturations in the 70s.  She was sent over to the hospital for further management.  She was placed on BiPAP in the emergency department.  Concern was for pneumonia versus pulmonary edema.  Neurology was consulted.  Patient was given steroids antibiotics and diuretics.  She was slow to improve.  Reason for Visit: Acute hypoxic respiratory failure  Consultants: Pulmonology  Procedures:  Transthoracic echocardiogram Study Conclusions  - Left ventricle: The cavity size was normal. Wall thickness was   normal. Systolic function was normal. The estimated ejection   fraction was in the range of 55% to 60%. Wall motion was normal;   there were no regional wall motion abnormalities. There was no   evidence of elevated ventricular filling pressure by Doppler   parameters. - Right atrium: The atrium was mildly dilated. - Tricuspid valve: There was mild regurgitation. - Pulmonary arteries: Systolic pressure was mildly to moderately   increased. PA peak pressure: 46 mm Hg (S). Impressions: - No cardiac source of emboli was indentified.  Antibiotics: Ceftriaxone and azithromycin  Subjective/Interval History: Pt has no new complaints today.  Continues to have a cough which is dry for the most part.  Denies any chest pain.  No headaches.  Back pain has improved.  ROS: Denies any nausea or vomiting  Objective:  Vital Signs  Vitals:   09/21/17 0827 09/21/17 0844 09/21/17 0848 09/21/17  1201  BP: 117/70   121/67  Pulse: 72   93  Resp: 17   15  Temp: 98.1 F (36.7 C)   98.3 F (36.8 C)  TempSrc: Oral   Oral  SpO2: 95% 94% 92% 94%  Weight:      Height:        Intake/Output Summary (Last 24 hours) at 09/21/2017 1234 Last data filed at 09/21/2017 0400 Gross per 24 hour  Intake 440 ml  Output 1100 ml  Net -660 ml   Filed Weights   09/19/17 0351 09/20/17 0332 09/21/17 0331  Weight: 45.6 kg (100 lb 8.5 oz) 43.9 kg (96 lb 12.5 oz) 43.2 kg (95 lb 3.8 oz)    General appearance: Pt in nad, alert and awake Resp: + rhales, no wheezes, equal chest rise. Cardio: S1-S2 is normal regular.  No S3-S4.  No rubs murmurs or bruit GI: Abdomen remains soft.  Nontender nondistended.   Extremities: No edema Neurologic: No focal neurological deficits.  Lab Results:  Data Reviewed: I have personally reviewed following labs and imaging studies  CBC: Recent Labs  Lab 09/16/17 1135 09/17/17 0417 09/18/17 0329 09/19/17 0206 09/20/17 0209 09/21/17 0233  WBC 10.5 6.9 5.9 8.5 8.8 10.1  NEUTROABS 9.3* 5.7  --   --   --  8.7*  HGB 9.8* 8.7* 8.0* 7.7* 9.4* 10.2*  HCT 30.7* 27.0* 26.0* 25.0* 30.6* 33.4*  MCV 83.0 83.1 85.0 84.7 85.5 85.0  PLT 208 149* 189 185 180 208    Basic Metabolic Panel: Recent Labs  Lab 09/17/17 0417 09/18/17 0329 09/19/17 0206 09/20/17 0209 09/21/17 0233  NA 139 143 137 137 136  K 4.2 4.0 4.5 3.5 3.3*  CL 112* 111 102 88* 86*  CO2 20* 25 30 37* 34*  GLUCOSE 93 113* 132* 199* 134*  BUN 16 16 17 16 18   CREATININE 0.47 0.48 0.35* 0.40* 0.46  CALCIUM 7.7* 8.2* 8.5* 8.5* 9.2  MG  --   --   --   --  1.7  PHOS  --   --   --   --  1.5*    GFR: Estimated Creatinine Clearance: 51.6 mL/min (by C-G formula based on SCr of 0.46 mg/dL).  Liver Function Tests: Recent Labs  Lab 09/16/17 1135 09/17/17 0417  AST 37 36  ALT 10* 10*  ALKPHOS 89 137*  BILITOT 0.3 <0.1*  PROT 6.5 5.4*  ALBUMIN 3.0* 2.5*     CBG: Recent Labs  Lab 09/16/17 1713    GLUCAP 111*     Radiology Studies: Dg Chest Port 1 View  Result Date: 09/21/2017 CLINICAL DATA:  Shortness of Breath EXAM: PORTABLE CHEST 1 VIEW COMPARISON:  09/20/2017 FINDINGS: Cardiac shadow is within normal limits. The lungs demonstrate mild changes of pulmonary edema although improved when compared with the prior exam. No focal confluent infiltrate is seen. Old rib fractures are noted on the left. No new focal abnormality is seen. IMPRESSION: Improving bilateral pulmonary edema. Electronically Signed   By: Alcide Clever M.D.   On: 09/21/2017 08:57   Dg Chest Port 1 View  Result Date: 09/20/2017 CLINICAL DATA:  Shortness of breath, respiratory failure EXAM: PORTABLE CHEST 1 VIEW COMPARISON:  09/17/2017 FINDINGS: Severe diffuse bilateral airspace disease again noted, unchanged. Cardiomegaly. No visible effusions or acute bony abnormality. IMPRESSION: Stable severe bilateral airspace disease. Cardiomegaly. Electronically Signed   By: Charlett Nose M.D.   On: 09/20/2017 08:44     Medications:  Scheduled: . arformoterol  15 mcg Nebulization BID  . aspirin EC  81 mg Oral Daily  . budesonide (PULMICORT) nebulizer solution  0.5 mg Nebulization BID  . docusate sodium  100 mg Oral BID  . enoxaparin (LOVENOX) injection  30 mg Subcutaneous Q24H  . ezetimibe  10 mg Oral Daily  . furosemide  40 mg Intravenous Q12H  . guaiFENesin  600 mg Oral BID  . mouth rinse  15 mL Mouth Rinse BID  . methylPREDNISolone (SOLU-MEDROL) injection  60 mg Intravenous Q8H  . nicotine  14 mg Transdermal Daily   Continuous: . azithromycin Stopped (09/20/17 1322)  . cefTRIAXone (ROCEPHIN)  IV Stopped (09/20/17 1437)   AVW:[id number] **OR** acetaminophen, albuterol, alum & mag hydroxide-simeth, morphine injection  Assessment/Plan:  Active Problems:   Community acquired pneumonia   Acute respiratory failure with hypoxia (HCC)   Acute respiratory failure (HCC)    Acute respiratory failure with  hypoxia Patient is slowly improving.  She has been off of BiPAP for more than 24 hours.  And now is off nonrebreather as well.  She is on oxygen by nasal cannula.  Will plan on continue antibiotics, steroids and diuretics.  Pulmonology is following and we appreciate their input.     Bilateral pulmonary infiltrates Reason for her infiltrates is not entirely clear.  She is found to have normal ejection fraction on echocardiogram.  Her BNP was only mildly elevated.  However patient appears to have shown some improvement with diuresis.   - She is also on antibiotics which will be continued.  Continue patient on ceftriaxone and azithromycin. Also continue patient on steroids.  - Respiratory viral panel is unremarkable.  ESR is noted  to be elevated.  Autoimmune workup in progress.  Pro-calcitonin is less than 0.5.  HIV is nonreactive.  EKG did not show any ischemic changes.   History of COPD with continued tobacco abuse Will plan on discussing and reinforcing cessation. Continue to monitor.  Continue home medications.  Continue steroids.  Iron Deficiency Anemia Anemia panel was done recently which showed iron level to be 19 TIBC 478.  Ferritin was 28 in February. It appears that she had a colonoscopy in March 2017 which was unremarkable.  Upper endoscopy done at the same time showed multiple gastric and duodenal ulcers.  Capsule study was done in September 2017 which showed small erosions throughout small bowel.  Biopsies taken during endoscopy did not show any concerning findings on pathology.  This will need further evaluation as outpatient.  She has been seen by hematology previously, Dr. Pamelia Hoit.  Back pain Denies any back pain today.  No recent falls or injuries.  No focal neurological deficits.  Continue to monitor.  Mobilize.     Hyperglycemia Secondary to steroids. - continue to monitor  DVT Prophylaxis: Lovenox    Code Status: FullCode Family Communication: Discussed with the  patient Disposition Plan: Management as outlined above.  Possible transfer to floor later today.    LOS: 5 days   Penny Pia  [facility] Pager [phone] 09/21/2017, 12:34 PM  If 7PM-7AM, please contact night-coverage at [email or web address], password [facility]

## 2017-09-22 MED ORDER — PREDNISONE 20 MG PO TABS
40.0000 mg | ORAL_TABLET | Freq: Every day | ORAL | Status: DC
  Administered 2017-09-23: 40 mg via ORAL
  Filled 2017-09-22 (×2): qty 2

## 2017-09-22 NOTE — Progress Notes (Signed)
Physical Therapy Treatment Patient Details Name: [name] MRN: [id number] DOB: 12-01-1955 Today's Date: 09/22/2017    History of Present Illness Pt is a 61 y.o. female presented to ED on 09/16/17 with dyspnea and saturations in the 70s. Found to have diffuse bilateral infiltrates with hypoxic respiratory failure (etiology unknown, felt to be infectious ?CAP). Pertinent PMH includes COPD, tobacco use, HTN, HLD, carotid stenosis (s/p CEA 2016), anemia.     PT Comments    Patient received in bed, pleasant and willing to participate with skilled PT services; she reports she has been getting up and down to the bathroom with staff supervision. Gait trained approximately 217ft with no device and Min guard today, 2LPM O2; intermittently checked sats with finger pulse oximeter, with O2 remaining 90-91% and HR varying from 90-101 during gait period. Also performed functional exercises at bedside today, noting muscle weakness, significant balance impairment, and high levels of fatigue with targeted exercises. Offered up to chair, patient declines stating she will do so this afternoon.  Patient left in bed with all questions and concerns addressed, all needs met this morning.    Follow Up Recommendations  Home health PT;Supervision - Intermittent     Equipment Recommendations  None recommended by PT    Recommendations for Other Services OT consult     Precautions / Restrictions Precautions Precautions: Fall Restrictions Weight Bearing Restrictions: No    Mobility  Bed Mobility Overal bed mobility: Modified Independent                Transfers Overall transfer level: Needs assistance Equipment used: None Transfers: Sit to/from Stand Sit to Stand: Supervision            Ambulation/Gait Ambulation/Gait assistance: Min guard Ambulation Distance (Feet): 200 Feet Assistive device: None Gait Pattern/deviations: Step-through pattern;Decreased stride length;Trunk flexed      General Gait Details: gait speed improving, patient mildly unsteady with turns; per finger pulse ox, O2 sats remain 90-91%, HR varies from 90-101 with gait period    Stairs            Wheelchair Mobility    Modified Rankin (Stroke Patients Only)       Balance Overall balance assessment: Needs assistance Sitting-balance support: Bilateral upper extremity supported;Feet supported Sitting balance-Leahy Scale: Good     Standing balance support: No upper extremity supported Standing balance-Leahy Scale: Fair Standing balance comment: unsteadiness with turns                             Cognition Arousal/Alertness: Awake/alert Behavior During Therapy: WFL for tasks assessed/performed Overall Cognitive Status: Within Functional Limits for tasks assessed                                        Exercises General Exercises - Lower Extremity Long Arc Quad: Both;10 reps;Other (comment);Strengthening(3 second holds each rep ) Hip ABduction/ADduction: Strengthening;Both;10 reps;Other (comment)(manual resistance from PT ) Other Exercises Other Exercises: sit to stand with eccentric lower 1x10 at bedside  Other Exercises: tandem stance 2x10 seconds B, gait belt and inside walker for safety     General Comments General comments (skin integrity, edema, etc.): 2LPM O2 per nasal cannula throughout session       Pertinent Vitals/Pain Pain Assessment: 0-10 Pain Score: 8  Pain Location: R ribs "especially when I cough"  Pain Descriptors / Indicators: Lambert Mody  Pain Intervention(s): Limited activity within patient's tolerance;Monitored during session    Home Living                      Prior Function            PT Goals (current goals can now be found in the care plan section) Acute Rehab PT Goals Patient Stated Goal: go home  PT Goal Formulation: With patient Time For Goal Achievement: 10/05/17 Potential to Achieve Goals: Good Progress  towards PT goals: Progressing toward goals    Frequency    Min 3X/week      PT Plan Current plan remains appropriate    Co-evaluation              AM-PAC PT "6 Clicks" Daily Activity  Outcome Measure  Difficulty turning over in bed (including adjusting bedclothes, sheets and blankets)?: None Difficulty moving from lying on back to sitting on the side of the bed? : None Difficulty sitting down on and standing up from a chair with arms (e.g., wheelchair, bedside commode, etc,.)?: None Help needed moving to and from a bed to chair (including a wheelchair)?: None Help needed walking in hospital room?: A Little Help needed climbing 3-5 steps with a railing? : A Little 6 Click Score: 22    End of Session Equipment Utilized During Treatment: Gait belt;Oxygen Activity Tolerance: Patient tolerated treatment well Patient left: in bed;with call bell/phone within reach   PT Visit Diagnosis: Other abnormalities of gait and mobility (R26.89);Muscle weakness (generalized) (M62.81)     Time: 1020-1045 PT Time Calculation (min) (ACUTE ONLY): 25 min  Charges:  $Gait Training: 8-22 mins $Therapeutic Exercise: 8-22 mins                    G Codes:       Nedra Hai PT, DPT, CBIS  Supplemental Physical Therapist [place]

## 2017-09-22 NOTE — Progress Notes (Signed)
[facility] PROGRESS NOTE  [name] ZOX:[id number] DOB: 1956-10-13 DOA: 09/16/2017  PCP: [name], MD  Brief History/Interval Summary: 61 year old Caucasian female with a past medical history of COPD who unfortunately continues to smoke, hyperlipidemia, duodenal ulcer, history of stroke and peripheral vascular disease presented to her primary care physician's office with a 2-week history of worsening shortness of breath.  She also had fever at home.  She was noted to be hypoxic with saturations in the 70s.  She was sent over to the hospital for further management.  She was placed on BiPAP in the emergency department.  Concern was for pneumonia versus pulmonary edema.  Neurology was consulted.  Patient was given steroids antibiotics and diuretics.  She was slow to improve.  Reason for Visit: Acute hypoxic respiratory failure  Consultants: Pulmonology  Procedures:  Transthoracic echocardiogram Study Conclusions  - Left ventricle: The cavity size was normal. Wall thickness was   normal. Systolic function was normal. The estimated ejection   fraction was in the range of 55% to 60%. Wall motion was normal;   there were no regional wall motion abnormalities. There was no   evidence of elevated ventricular filling pressure by Doppler   parameters. - Right atrium: The atrium was mildly dilated. - Tricuspid valve: There was mild regurgitation. - Pulmonary arteries: Systolic pressure was mildly to moderately   increased. PA peak pressure: 46 mm Hg (S). Impressions: - No cardiac source of emboli was indentified.  Antibiotics: Ceftriaxone and azithromycin  Subjective/Interval History: Pt has no new complaints currently. Feels her breathing status is better.  ROS: Denies any nausea or vomiting  Objective:  Vital Signs  Vitals:   09/21/17 2128 09/22/17 0639 09/22/17 0729 09/22/17 1052  BP: 131/73 111/61    Pulse: 74 69    Resp: 16 16    Temp: 98.5 F (36.9  C) 98.3 F (36.8 C)    TempSrc: Oral Oral    SpO2: 96% 92% 95% 91%  Weight:      Height:        Intake/Output Summary (Last 24 hours) at 09/22/2017 1744 Last data filed at 09/22/2017 0320 Gross per 24 hour  Intake -  Output 200 ml  Net -200 ml   Filed Weights   09/19/17 0351 09/20/17 0332 09/21/17 0331  Weight: 45.6 kg (100 lb 8.5 oz) 43.9 kg (96 lb 12.5 oz) 43.2 kg (95 lb 3.8 oz)   Exam unchanged when compared to 09/21/2017 General appearance: Pt in nad, alert and awake Resp: + rhales, no wheezes, equal chest rise. Cardio: S1-S2 is normal regular.  No S3-S4.  No rubs murmurs or bruit GI: Abdomen remains soft.  Nontender nondistended.   Extremities: No edema Neurologic: No focal neurological deficits.  Lab Results:  Data Reviewed: I have personally reviewed following labs and imaging studies  CBC: Recent Labs  Lab 09/16/17 1135 09/17/17 0417 09/18/17 0329 09/19/17 0206 09/20/17 0209 09/21/17 0233  WBC 10.5 6.9 5.9 8.5 8.8 10.1  NEUTROABS 9.3* 5.7  --   --   --  8.7*  HGB 9.8* 8.7* 8.0* 7.7* 9.4* 10.2*  HCT 30.7* 27.0* 26.0* 25.0* 30.6* 33.4*  MCV 83.0 83.1 85.0 84.7 85.5 85.0  PLT 208 149* 189 185 180 208    Basic Metabolic Panel: Recent Labs  Lab 09/17/17 0417 09/18/17 0329 09/19/17 0206 09/20/17 0209 09/21/17 0233  NA 139 143 137 137 136  K 4.2 4.0 4.5 3.5 3.3*  CL 112* 111 102 88* 86*  CO2  20* 25 30 37* 34*  GLUCOSE 93 113* 132* 199* 134*  BUN 16 16 17 16 18   CREATININE 0.47 0.48 0.35* 0.40* 0.46  CALCIUM 7.7* 8.2* 8.5* 8.5* 9.2  MG  --   --   --   --  1.7  PHOS  --   --   --   --  1.5*    GFR: Estimated Creatinine Clearance: 51.6 mL/min (by C-G formula based on SCr of 0.46 mg/dL).  Liver Function Tests: Recent Labs  Lab 09/16/17 1135 09/17/17 0417  AST 37 36  ALT 10* 10*  ALKPHOS 89 137*  BILITOT 0.3 <0.1*  PROT 6.5 5.4*  ALBUMIN 3.0* 2.5*     CBG: Recent Labs  Lab 09/16/17 1713  GLUCAP 111*     Radiology Studies: Dg  Chest Port 1 View  Result Date: 09/21/2017 CLINICAL DATA:  Shortness of Breath EXAM: PORTABLE CHEST 1 VIEW COMPARISON:  09/20/2017 FINDINGS: Cardiac shadow is within normal limits. The lungs demonstrate mild changes of pulmonary edema although improved when compared with the prior exam. No focal confluent infiltrate is seen. Old rib fractures are noted on the left. No new focal abnormality is seen. IMPRESSION: Improving bilateral pulmonary edema. Electronically Signed   By: Alcide Clever M.D.   On: 09/21/2017 08:57     Medications:  Scheduled: . arformoterol  15 mcg Nebulization BID  . aspirin EC  81 mg Oral Daily  . budesonide (PULMICORT) nebulizer solution  0.5 mg Nebulization BID  . docusate sodium  100 mg Oral BID  . enoxaparin (LOVENOX) injection  30 mg Subcutaneous Q24H  . ezetimibe  10 mg Oral Daily  . furosemide  40 mg Intravenous Q12H  . guaiFENesin  600 mg Oral BID  . mouth rinse  15 mL Mouth Rinse BID  . nicotine  14 mg Transdermal Daily  . predniSONE  40 mg Oral BID WC   Continuous:  GEX:[id number] **OR** acetaminophen, albuterol, alum & mag hydroxide-simeth, morphine injection  Assessment/Plan:  Active Problems:   Community acquired pneumonia   Acute respiratory failure with hypoxia (HCC)   Acute respiratory failure (HCC)    Acute respiratory failure with hypoxia Patient is slowly improving.  She has been off of BiPAP for more than 24 hours.  And now is off nonrebreather as well.  She is on oxygen by nasal cannula.  Will plan on continue antibiotics, steroids and diuretics.   - Pulmonology is assisting.  Bilateral pulmonary infiltrates Reason for her infiltrates is not entirely clear.  She is found to have normal ejection fraction on echocardiogram.  Her BNP was only mildly elevated.  However patient appears to have shown some improvement with diuresis.   - She is also on antibiotics which will be continued.  Continue patient on ceftriaxone and azithromycin. Also  continue patient on steroids.  - Respiratory viral panel is unremarkable.  ESR is noted to be elevated.  Autoimmune workup in progress.  Pro-calcitonin is less than 0.5.  HIV is nonreactive.  EKG did not show any ischemic changes.   History of COPD with continued tobacco abuse - continue current regimen - steroids as outlined by Pulmonary team. Will decrease prednisone to 40 mg po daily from BID dosing  Iron Deficiency Anemia Anemia panel was done recently which showed iron level to be 19 TIBC 478.  Ferritin was 28 in February. It appears that she had a colonoscopy in March 2017 which was unremarkable.  Upper endoscopy done at the same time showed  multiple gastric and duodenal ulcers.  Capsule study was done in September 2017 which showed small erosions throughout small bowel.  Biopsies taken during endoscopy did not show any concerning findings on pathology.  This will need further evaluation as outpatient.  She has been seen by hematology previously, Dr. Pamelia Hoit.  Back pain Denies any back pain today.  No recent falls or injuries.  No focal neurological deficits.  Continue to monitor.  Mobilize.     Hyperglycemia Secondary to steroids. - continue to monitor  DVT Prophylaxis: Lovenox    Code Status: FullCode Family Communication: Discussed with the patient Disposition Plan: Management as outlined above.  Possible transfer to floor later today.    LOS: 6 days   Penny Pia  [facility] Pager [phone] 09/22/2017, 5:44 PM  If 7PM-7AM, please contact night-coverage at [email or web address], password [facility]

## 2017-09-22 NOTE — Progress Notes (Signed)
Paged on call provider to d/c Cardiac monitoring since patient is med-surge per Charge nurse.

## 2017-09-22 NOTE — Plan of Care (Signed)
  Progressing Clinical Measurements: Respiratory complications will improve 09/22/2017 1008 - Progressing by Rance Muir, RN Coping: Level of anxiety will decrease 09/22/2017 1008 - Progressing by Rance Muir, RN Pain Managment: General experience of comfort will improve 09/22/2017 1008 - Progressing by Rance Muir, RN Safety: Ability to remain free from injury will improve 09/22/2017 1008 - Progressing by Rance Muir, RN Activity: Ability to tolerate increased activity will improve 09/22/2017 1008 - Progressing by Rance Muir, RN Clinical Measurements: Ability to maintain a body temperature in the normal range will improve 09/22/2017 1008 - Progressing by Rance Muir, RN Respiratory: Ability to maintain adequate ventilation will improve 09/22/2017 1008 - Progressing by Rance Muir, RN Ability to maintain a clear airway will improve 09/22/2017 1008 - Progressing by Rance Muir, RN

## 2017-09-22 NOTE — Care Management Note (Signed)
Case Management Note  Patient Details  Name: [name] MRN: [id number] Date of Birth: [birth year]  Subjective/Objective:   61 yr old female admitted with pneumonia.                 Action/Plan: Case manager spoke with patient concerning discharge plan and the referral for Home Health therapy. Patient says she doesn't feel she needs Physical therapy at home and pleasantly declined. Patient lives home with husband, will have support at discharge.    Expected Discharge Date:  09/23/17               Expected Discharge Plan:  Home/Self Care  In-House Referral:  NA  Discharge planning Services  CM Consult  Post Acute Care Choice:  NA Choice offered to:  Patient  DME Arranged:  N/A DME Agency:  NA  HH Arranged:  Patient Refused [place] Agency:  NA  Status of Service:  Completed, signed off  If discussed at [place] of Stay Meetings, dates discussed:    Additional Comments:  Ninfa Meeker, RN 09/22/2017, 3:25 PM

## 2017-09-23 LAB — EXPECTORATED SPUTUM ASSESSMENT W GRAM STAIN, RFLX TO RESP C

## 2017-09-23 LAB — EXPECTORATED SPUTUM ASSESSMENT W REFEX TO RESP CULTURE

## 2017-09-23 MED ORDER — POTASSIUM CHLORIDE ER 10 MEQ PO TBCR: 10.0000 meq | EXTENDED_RELEASE_TABLET | Freq: Every day | ORAL | 0 refills | Status: DC

## 2017-09-23 MED ORDER — FUROSEMIDE 40 MG PO TABS: 40.0000 mg | ORAL_TABLET | Freq: Every day | ORAL | 0 refills | Status: DC

## 2017-09-23 MED ORDER — PREDNISONE 20 MG PO TABS: 20.0000 mg | ORAL_TABLET | Freq: Every day | ORAL | 0 refills | Status: DC

## 2017-09-23 MED ORDER — PREDNISONE 20 MG PO TABS: 20.0000 mg | ORAL_TABLET | Freq: Every day | ORAL | Status: DC

## 2017-09-23 NOTE — Discharge Summary (Signed)
Physician Discharge Summary  [name] CZY:[id number] DOB: 10/22/1955 DOA: 09/16/2017  PCP: [name], MD  Admit date: 09/16/2017 Discharge date: 09/23/2017  Time spent: > 35 minutes  Recommendations for Outpatient Follow-up:  1. Ensure f/u with pulmonology within 2 weeks for pneumonitis.   Discharge Diagnoses:  Active Problems:   Community acquired pneumonia   Acute respiratory failure with hypoxia (HCC)   Acute respiratory failure (HCC)   Discharge Condition: stable  Diet recommendation: regular diet  Filed Weights   09/19/17 0351 09/20/17 0332 09/21/17 0331  Weight: 45.6 kg (100 lb 8.5 oz) 43.9 kg (96 lb 12.5 oz) 43.2 kg (95 lb 3.8 oz)    History of present illness:  61 year old Caucasian femalemale with a past medical history of COPD who unfortunately continues to smoke, hyperlipidemia, duodenal ulcer, history of stroke and peripheral vascular disease presented to her primary care physician's office with a 2-week history of worsening shortness of breath.  She also had fever at home.  She was noted to be hypoxic with saturations in the 70s.  She was sent over to the hospital for further management.  She was placed on BiPAP in the emergency department.  Concern was for pneumonia versus pulmonary edema.  Neurology was consulted.  Patient was given steroids antibiotics and diuretics.  She was slow to improve.  Hospital Course:  Pneumonitis (not well characterized) - Pulmonology on board and recommended the following: Again neg 1.1 liter and improved status Remains on low O2 pcxr Which I reviewed shows continued resolving int infiltrates and slight fluid rt fissure What seems to have improved her the most is lasis, non cardiogenix? Edema component of atypical illness? Would treat azithro x 5 days and ctx  (or beta lac) x 7 days would maintain lasix to neg balance similar goals of last few days Would change steroids to oral pred 40 bid and lower this to dc dose 20 mg  daily over 1-2 days but then have follow up with pulm with esr and pcxr in 1 week, with plan to wean pred to off over 1 week, as likely this represents a pneumonitis of unclear cause Allow to dc mucomyst and chest pt supp k especially with lasix needs  Should follow up in pulm office Will sign off ,call if needed  Otherwise for known medical conditions prior to admission will continue prior to admission medication regimen.  Procedures:  None  Consultations:  Pulmonology  Discharge Exam: Vitals:   09/23/17 0541 09/23/17 0822  BP: 108/63   Pulse: 82   Resp: 16   Temp: (!) 97.4 F (36.3 C)   SpO2: 98% 95%    General: Pt in nad, alert and awake Cardiovascular: rrr, no rubs Respiratory: no increased wob, no wheezes  Discharge Instructions   Discharge Instructions    Diet - low sodium heart healthy   Complete by:  As directed    Discharge instructions   Complete by:  As directed    Please should follow-up with your pulmonologist within the next 2 weeks. For further evaluation and recommendations.   Increase activity slowly   Complete by:  As directed      Allergies as of 09/23/2017      Reactions   Celebrex [celecoxib] Other (See Comments)   GI bleed   Crestor [rosuvastatin] Other (See Comments)   Made legs hurt   Penicillins Other (See Comments)   Has patient had a PCN reaction causing immediate rash, facial/tongue/throat swelling, SOB or lightheadedness with hypotension: Yes Has  patient had a PCN reaction causing severe rash involving mucus membranes or skin necrosis: No Has patient had a PCN reaction that required hospitalization: No Has patient had a PCN reaction occurring within the last 10 years: Yes If all of the above answers are "NO", then may proceed with Cephalosporin use. REACTION: whelps   Pravastatin Other (See Comments)   Muscle aches      Medication List    TAKE these medications   ADVAIR DISKUS 250-50 MCG/DOSE Aepb Generic drug:   Fluticasone-Salmeterol Inhale 1 puff into the lungs every 12 (twelve) hours.   albuterol 108 (90 Base) MCG/ACT inhaler Commonly known as:  PROVENTIL HFA;VENTOLIN HFA Inhale 2 puffs into the lungs every 6 (six) hours as needed for wheezing or shortness of breath.   aspirin EC 81 MG tablet Take 1 tablet (81 mg total) by mouth daily.   ezetimibe 10 MG tablet Commonly known as:  ZETIA Take 1 tablet (10 mg total) by mouth daily.   furosemide 40 MG tablet Commonly known as:  LASIX Take 1 tablet (40 mg total) by mouth daily.   INTEGRA PLUS Caps Take 1 capsule by mouth daily.   potassium chloride 10 MEQ tablet Commonly known as:  K-DUR Take 1 tablet (10 mEq total) by mouth daily.   predniSONE 20 MG tablet Commonly known as:  DELTASONE Take 1 tablet (20 mg total) by mouth daily. Start taking on:  09/24/2017      Allergies  Allergen Reactions  . Celebrex [Celecoxib] Other (See Comments)    GI bleed  . Crestor [Rosuvastatin] Other (See Comments)    Made legs hurt  . Penicillins Other (See Comments)    Has patient had a PCN reaction causing immediate rash, facial/tongue/throat swelling, SOB or lightheadedness with hypotension: Yes Has patient had a PCN reaction causing severe rash involving mucus membranes or skin necrosis: No Has patient had a PCN reaction that required hospitalization: No Has patient had a PCN reaction occurring within the last 10 years: Yes If all of the above answers are "NO", then may proceed with Cephalosporin use.   REACTION: whelps  . Pravastatin Other (See Comments)    Muscle aches      The results of significant diagnostics from this hospitalization (including imaging, microbiology, ancillary and laboratory) are listed below for reference.    Significant Diagnostic Studies: Ct Head Wo Contrast  Result Date: 09/16/2017 CLINICAL DATA:  Hypoxia, and weakness. EXAM: CT HEAD WITHOUT CONTRAST TECHNIQUE: Contiguous axial images were obtained from the  base of the skull through the vertex without intravenous contrast. COMPARISON:  None. FINDINGS: Brain: No evidence for acute infarction, hemorrhage, mass lesion, hydrocephalus, or extra-axial fluid. Normal cerebral volume. No white matter disease. Vascular: Calcification of the cavernous internal carotid arteries consistent with cerebrovascular atherosclerotic disease. No signs of intracranial large vessel occlusion. Skull: Normal. Negative for fracture or focal lesion. Sinuses/Orbits: No acute finding.  BILATERAL cataract extraction. Other: None. IMPRESSION: Calcific cerebrovascular atherosclerotic disease. No acute intracranial findings. No signs of large vessel occlusion. Electronically Signed   By: Elsie Stain M.D.   On: 09/16/2017 19:44   Ct Angio Chest Pe W And/or Wo Contrast  Result Date: 09/16/2017 CLINICAL DATA:  Hypoxic, shortness of breath, altered mental status, can't breathe, history end-stage COPD, hypertension, smoker EXAM: CT ANGIOGRAPHY CHEST WITH CONTRAST TECHNIQUE: Multidetector CT imaging of the chest was performed using the standard protocol during bolus administration of intravenous contrast. Multiplanar CT image reconstructions and MIPs were obtained to evaluate the vascular  anatomy. CONTRAST:  80mL ISOVUE-370 IOPAMIDOL (ISOVUE-370) INJECTION 76% IV COMPARISON:  09/02/2016 FINDINGS: Cardiovascular: Aorta normal caliber without aneurysm or dissection. No pericardial effusion. Pulmonary arteries well opacified and patent. No evidence of pulmonary embolism. Mediastinum/Nodes: Base of cervical region normal appearance. Esophagus unremarkable. No definite thoracic adenopathy. Lungs/Pleura: Diffuse BILATERAL pulmonary infiltrates, mixed interstitial and alveolar, with predominance in the mid to upper lungs. These may represent diffuse pulmonary edema or infection. Unable to exclude small pulmonary nodules in the setting of diffuse infiltrates. No pleural effusion or pneumothorax. Upper  Abdomen: At reflux of contrast into IVC and hepatic veins question elevated RIGHT heart pressures. Remaining visualized upper abdomen unremarkable. Musculoskeletal: No acute osseous findings. Review of the MIP images confirms the above findings. IMPRESSION: Diffuse BILATERAL pulmonary infiltrates with predominance in the mid upper lungs question pulmonary edema or infection. No evidence of pulmonary embolism. Electronically Signed   By: Ulyses Southward M.D.   On: 09/16/2017 15:13   Dg Chest Port 1 View  Result Date: 09/21/2017 CLINICAL DATA:  Shortness of Breath EXAM: PORTABLE CHEST 1 VIEW COMPARISON:  09/20/2017 FINDINGS: Cardiac shadow is within normal limits. The lungs demonstrate mild changes of pulmonary edema although improved when compared with the prior exam. No focal confluent infiltrate is seen. Old rib fractures are noted on the left. No new focal abnormality is seen. IMPRESSION: Improving bilateral pulmonary edema. Electronically Signed   By: Alcide Clever M.D.   On: 09/21/2017 08:57   Dg Chest Port 1 View  Result Date: 09/20/2017 CLINICAL DATA:  Shortness of breath, respiratory failure EXAM: PORTABLE CHEST 1 VIEW COMPARISON:  09/17/2017 FINDINGS: Severe diffuse bilateral airspace disease again noted, unchanged. Cardiomegaly. No visible effusions or acute bony abnormality. IMPRESSION: Stable severe bilateral airspace disease. Cardiomegaly. Electronically Signed   By: Charlett Nose M.D.   On: 09/20/2017 08:44   Dg Chest Portable 1 View  Result Date: 09/17/2017 CLINICAL DATA:  Pneumonia EXAM: PORTABLE CHEST 1 VIEW COMPARISON:  09/16/2017 FINDINGS: Diffuse bilateral airspace opacities are again noted, unchanged. Heart is upper limits normal in size. No visible effusions or pneumothorax. No acute bony abnormality. IMPRESSION: Severe diffuse bilateral airspace disease, not significantly changed. Electronically Signed   By: Charlett Nose M.D.   On: 09/17/2017 08:03   Dg Chest Port 1 View  Result Date:  09/16/2017 CLINICAL DATA:  Increasing shortness of breath. History of asthma, COPD, hypertension and pneumonia. EXAM: PORTABLE CHEST 1 VIEW COMPARISON:  Radiographs 11/22/2014.  CT 09/02/2016. FINDINGS: 1138 hours. The heart size and mediastinal contours are stable. There are new diffuse perihilar airspace opacities, greater on the left. There is no pleural effusion or pneumothorax. The bones and soft tissues appear unremarkable. Telemetry leads overlie the chest. IMPRESSION: New bilateral perihilar airspace opacities, most consistent with acute pulmonary edema. Atypical infection considered less likely. Electronically Signed   By: Carey Bullocks M.D.   On: 09/16/2017 12:00    Microbiology: Recent Results (from the past 240 hour(s))  Blood Culture (routine x 2)     Status: None   Collection Time: 09/16/17 11:35 AM  Result Value Ref Range Status   Specimen Description BLOOD RIGHT ANTECUBITAL  Final   Special Requests   Final    BOTTLES DRAWN AEROBIC AND ANAEROBIC Blood Culture adequate volume   Culture NO GROWTH 5 DAYS  Final   Report Status 09/21/2017 FINAL  Final  Blood Culture (routine x 2)     Status: None   Collection Time: 09/16/17 11:40 AM  Result Value Ref  Range Status   Specimen Description BLOOD LEFT ANTECUBITAL  Final   Special Requests   Final    BOTTLES DRAWN AEROBIC AND ANAEROBIC Blood Culture results may not be optimal due to an excessive volume of blood received in culture bottles   Culture NO GROWTH 5 DAYS  Final   Report Status 09/21/2017 FINAL  Final  Culture, sputum-assessment     Status: None (Preliminary result)   Collection Time: 09/16/17  4:30 PM  Result Value Ref Range Status   Specimen Description EXPECTORATED SPUTUM  Final   Special Requests NONE  Final   Sputum evaluation   Final    Sputum specimen not acceptable for testing.  Please recollect.   RESULT CALLED TO, READ BACK BY AND VERIFIED WITH: Julio Sicks RN [id number] 09/21/17 A BROWNING    Report Status PENDING   Incomplete  Urine culture     Status: Abnormal   Collection Time: 09/16/17  4:36 PM  Result Value Ref Range Status   Specimen Description URINE, RANDOM  Final   Special Requests NONE  Final   Culture <10,000 COLONIES/mL INSIGNIFICANT GROWTH (A)  Final   Report Status 09/17/2017 FINAL  Final  MRSA PCR Screening     Status: None   Collection Time: 09/17/17  9:25 AM  Result Value Ref Range Status   MRSA by PCR NEGATIVE NEGATIVE Final    Comment:        The GeneXpert MRSA Assay (FDA approved for NASAL specimens only), is one component of a comprehensive MRSA colonization surveillance program. It is not intended to diagnose MRSA infection nor to guide or monitor treatment for MRSA infections.   Respiratory Panel by PCR     Status: None   Collection Time: 09/18/17  5:38 AM  Result Value Ref Range Status   Adenovirus NOT DETECTED NOT DETECTED Final   Coronavirus 229E NOT DETECTED NOT DETECTED Final   Coronavirus HKU1 NOT DETECTED NOT DETECTED Final   Coronavirus NL63 NOT DETECTED NOT DETECTED Final   Coronavirus OC43 NOT DETECTED NOT DETECTED Final   Metapneumovirus NOT DETECTED NOT DETECTED Final   Rhinovirus / Enterovirus NOT DETECTED NOT DETECTED Final   Influenza A NOT DETECTED NOT DETECTED Final   Influenza B NOT DETECTED NOT DETECTED Final   Parainfluenza Virus 1 NOT DETECTED NOT DETECTED Final   Parainfluenza Virus 2 NOT DETECTED NOT DETECTED Final   Parainfluenza Virus 3 NOT DETECTED NOT DETECTED Final   Parainfluenza Virus 4 NOT DETECTED NOT DETECTED Final   Respiratory Syncytial Virus NOT DETECTED NOT DETECTED Final   Bordetella pertussis NOT DETECTED NOT DETECTED Final   Chlamydophila pneumoniae NOT DETECTED NOT DETECTED Final   Mycoplasma pneumoniae NOT DETECTED NOT DETECTED Final     Labs: Basic Metabolic Panel: Recent Labs  Lab 09/17/17 0417 09/18/17 0329 09/19/17 0206 09/20/17 0209 09/21/17 0233  NA 139 143 137 137 136  K 4.2 4.0 4.5 3.5 3.3*  CL 112*  111 102 88* 86*  CO2 20* 25 30 37* 34*  GLUCOSE 93 113* 132* 199* 134*  BUN 16 16 17 16 18   CREATININE 0.47 0.48 0.35* 0.40* 0.46  CALCIUM 7.7* 8.2* 8.5* 8.5* 9.2  MG  --   --   --   --  1.7  PHOS  --   --   --   --  1.5*   Liver Function Tests: Recent Labs  Lab 09/17/17 0417  AST 36  ALT 10*  ALKPHOS 137*  BILITOT <0.1*  PROT  5.4*  ALBUMIN 2.5*   No results for input(s): LIPASE, AMYLASE in the last 168 hours. No results for input(s): AMMONIA in the last 168 hours. CBC: Recent Labs  Lab 09/17/17 0417 09/18/17 0329 09/19/17 0206 09/20/17 0209 09/21/17 0233  WBC 6.9 5.9 8.5 8.8 10.1  NEUTROABS 5.7  --   --   --  8.7*  HGB 8.7* 8.0* 7.7* 9.4* 10.2*  HCT 27.0* 26.0* 25.0* 30.6* 33.4*  MCV 83.1 85.0 84.7 85.5 85.0  PLT 149* 189 185 180 208   Cardiac Enzymes: No results for input(s): CKTOTAL, CKMB, CKMBINDEX, TROPONINI in the last 168 hours. BNP: BNP (last 3 results) Recent Labs    09/16/17 1136 09/17/17 1632  BNP 258.1* 365.0*    ProBNP (last 3 results) No results for input(s): PROBNP in the last 8760 hours.  CBG: Recent Labs  Lab 09/16/17 1713  GLUCAP 111*       Signed:  Penny Pia MD.  [facility] 09/23/2017, 3:01 PM

## 2017-09-23 NOTE — Plan of Care (Signed)
  Progressing Clinical Measurements: Respiratory complications will improve 09/23/2017 0836 - Progressing by Rance Muir, RN Coping: Level of anxiety will decrease 09/23/2017 0836 - Progressing by Rance Muir, RN Pain Managment: General experience of comfort will improve 09/23/2017 0836 - Progressing by Rance Muir, RN Safety: Ability to remain free from injury will improve 09/23/2017 0836 - Progressing by Rance Muir, RN Activity: Ability to tolerate increased activity will improve 09/23/2017 0836 - Progressing by Rance Muir, RN Clinical Measurements: Ability to maintain a body temperature in the normal range will improve 09/23/2017 0836 - Progressing by Rance Muir, RN Respiratory: Ability to maintain adequate ventilation will improve 09/23/2017 0836 - Progressing by Rance Muir, RN Ability to maintain a clear airway will improve 09/23/2017 0836 - Progressing by Rance Muir, RN

## 2017-09-23 NOTE — Progress Notes (Signed)
Physical Therapy Treatment Patient Details Name: Keviana Balsam MRN: [id number] DOB: 10-Oct-1956 Today's Date: 09/23/2017    History of Present Illness Pt is a 61 y.o. female presented to ED on 09/16/17 with dyspnea and saturations in the 70s. Found to have diffuse bilateral infiltrates with hypoxic respiratory failure (etiology unknown, felt to be infectious ?CAP). Pertinent PMH includes COPD, tobacco use, HTN, HLD, carotid stenosis (s/p CEA 2016), anemia.     PT Comments    Patient received in bed, pleasant and willing to participate with skilled PT services today; she reports she is pleased to be going home today. RN requested that PT ambulate patient on room air today to assess vitals response. Ambulated approximately 536ft with no device, room air with intermittent pulse ox measures; note patient able to maintain consistent conversation with no noted SOB during this distance. O2 sats generally 90-92%, HR however 120-130BPM during gait period on room air; both numbers appeared to improve as appropriate with seated rest after gait distance. Patient left in bed with all questions/concerns addressed, all needs met, RN aware of O2/HR response to activity on room air.    Follow Up Recommendations  Home health PT;Supervision - Intermittent     Equipment Recommendations  None recommended by PT    Recommendations for Other Services       Precautions / Restrictions Precautions Precautions: Fall Restrictions Weight Bearing Restrictions: No    Mobility  Bed Mobility Overal bed mobility: Independent                Transfers Overall transfer level: Independent Equipment used: None Transfers: Sit to/from Stand Sit to Stand: Independent            Ambulation/Gait Ambulation/Gait assistance: Modified independent (Device/Increase time) Ambulation Distance (Feet): 500 Feet Assistive device: None Gait Pattern/deviations: WFL(Within Functional Limits);Step-through pattern          Information systems manager [place] (Stroke Patients Only)       Balance Overall balance assessment: Needs assistance Sitting-balance support: Bilateral upper extremity supported;Feet supported Sitting balance-Leahy Scale: Normal     Standing balance support: No upper extremity supported Standing balance-Leahy Scale: Good Standing balance comment: no significant unsteadiness noted today                             Cognition Arousal/Alertness: Awake/alert Behavior During Therapy: WFL for tasks assessed/performed Overall Cognitive Status: Within Functional Limits for tasks assessed                                        Exercises      General Comments General comments (skin integrity, edema, etc.): RN requested that PT obtain O2 sats on room air during session       Pertinent Vitals/Pain Pain Assessment: 0-10 Pain Score: 4  Pain Location: R ribs "especially when I cough"  Pain Descriptors / Indicators: Sharp Pain Intervention(s): Limited activity within patient's tolerance;Monitored during session    Home Living                      Prior Function            PT Goals (current goals can now be found in the care plan section) Acute Rehab PT Goals Patient Stated Goal: go  home  PT Goal Formulation: With patient Time For Goal Achievement: 10/05/17 Potential to Achieve Goals: Good Progress towards PT goals: Progressing toward goals    Frequency    Min 3X/week      PT Plan Current plan remains appropriate    Co-evaluation              AM-PAC PT "6 Clicks" Daily Activity  Outcome Measure  Difficulty turning over in bed (including adjusting bedclothes, sheets and blankets)?: None Difficulty moving from lying on back to sitting on the side of the bed? : None Difficulty sitting down on and standing up from a chair with arms (e.g., wheelchair, bedside commode, etc,.)?: None Help  needed moving to and from a bed to chair (including a wheelchair)?: None Help needed walking in hospital room?: None Help needed climbing 3-5 steps with a railing? : A Little 6 Click Score: 23    End of Session   Activity Tolerance: Patient tolerated treatment well Patient left: in bed;with call bell/phone within reach Nurse Communication: Other (comment)(O2/HR response to activity on room air ) PT Visit Diagnosis: Other abnormalities of gait and mobility (R26.89);Muscle weakness (generalized) (M62.81)     Time: 1300-1316 PT Time Calculation (min) (ACUTE ONLY): 16 min  Charges:  $Gait Training: 8-22 mins                    G Codes:       Nedra Hai PT, DPT, CBIS  Supplemental Physical Therapist [place]

## 2017-09-23 NOTE — Progress Notes (Signed)
Occupational Therapy Treatment Patient Details Name: [name] MRN: [id number] DOB: Jul 02, 1956 Today's Date: 09/23/2017    History of present illness Pt is a 61 y.o. female presented to ED on 09/16/17 with dyspnea and saturations in the 70s. Found to have diffuse bilateral infiltrates with hypoxic respiratory failure (etiology unknown, felt to be infectious ?CAP). Pertinent PMH includes COPD, tobacco use, HTN, HLD, carotid stenosis (s/p CEA 2016), anemia.    OT comments  Pt making good progress with functional goals and will d/c home this afternoon  Follow Up Recommendations  No OT follow up;Supervision - Intermittent    Equipment Recommendations       Recommendations for Other Services      Precautions / Restrictions Precautions Precautions: Fall Restrictions Weight Bearing Restrictions: No       Mobility Bed Mobility Overal bed mobility: Independent                Transfers Overall transfer level: Independent Equipment used: None Transfers: Sit to/from Stand Sit to Stand: Independent              Balance Overall balance assessment: Needs assistance Sitting-balance support: Bilateral upper extremity supported;Feet supported Sitting balance-Leahy Scale: Normal     Standing balance support: No upper extremity supported Standing balance-Leahy Scale: Good Standing balance comment: no significant unsteadiness noted today                            ADL either performed or assessed with clinical judgement   ADL Overall ADL's : Needs assistance/impaired     Grooming: Wash/dry hands;Wash/dry face;Standing;Supervision/safety   Upper Body Bathing: Supervision/ safety   Lower Body Bathing: Min guard;Sit to/from stand;Supervison/ safety   Upper Body Dressing : Supervision/safety   Lower Body Dressing: Min guard;Sit to/from stand;Supervision/safety   Toilet Transfer: Ambulation;Comfort height toilet;RW;Grab bars;Modified Independent;BSC    Toileting- Clothing Manipulation and Hygiene: Sit to/from stand;Supervision/safety   Tub/ Engineer, structural: Modified independent;3 in 1           Vision Patient Visual Report: No change from baseline     Perception     Praxis      Cognition Arousal/Alertness: Awake/alert Behavior During Therapy: WFL for tasks assessed/performed Overall Cognitive Status: Within Functional Limits for tasks assessed                                          Exercises     Shoulder Instructions     Pt very pleasant and cooperative  General Comments RN ok with pt being off O2 since SATs in 90s with activity, HR in 90s    Pertinent Vitals/ Pain       Pain Assessment: No/denies pain Pain Score: 4  Pain Location: R ribs "especially when I cough"  Pain Descriptors / Indicators: Sharp Pain Intervention(s): Monitored during session  Home Living                                          Prior Functioning/Environment              Frequency  Min 2X/week        Progress Toward Goals  OT Goals(current goals can now be found in the care plan section)  Progress towards OT goals:  Progressing toward goals  Acute Rehab OT Goals Patient Stated Goal: go home   Plan Discharge plan remains appropriate    Co-evaluation                 AM-PAC PT "6 Clicks" Daily Activity     Outcome Measure   Help from another person eating meals?: None Help from another person taking care of personal grooming?: A Little Help from another person toileting, which includes using toliet, bedpan, or urinal?: A Little Help from another person bathing (including washing, rinsing, drying)?: A Little Help from another person to put on and taking off regular upper body clothing?: None Help from another person to put on and taking off regular lower body clothing?: A Little 6 Click Score: 20    End of Session Equipment Utilized During Treatment: Rolling walker  OT  Visit Diagnosis: Unsteadiness on feet (R26.81)   Activity Tolerance Patient tolerated treatment well   Patient Left in bed;with call bell/phone within reach   Nurse Communication      Functional Assessment Tool Used: AM-PAC 6 Clicks Daily Activity   Time: 1416-1440 OT Time Calculation (min): 24 min  Charges: OT G-codes **NOT FOR INPATIENT CLASS** Functional Assessment Tool Used: AM-PAC 6 Clicks Daily Activity OT General Charges $OT Visit: 1 Visit OT Treatments $Self Care/Home Management : 8-22 mins $Therapeutic Activity: 8-22 mins     Galen Manila 09/23/2017, 1:50 PM

## 2017-10-04 ENCOUNTER — Encounter: Payer: Self-pay | Admitting: Physician Assistant

## 2017-10-04 ENCOUNTER — Ambulatory Visit: Payer: BLUE CROSS/BLUE SHIELD | Admitting: Physician Assistant

## 2017-10-04 VITALS — BP 110/66 | HR 90 | Temp 97.6°F | Resp 14 | Ht 62.0 in | Wt 99.2 lb

## 2017-10-04 DIAGNOSIS — E441 Mild protein-calorie malnutrition: Secondary | ICD-10-CM

## 2017-10-04 DIAGNOSIS — J9601 Acute respiratory failure with hypoxia: Secondary | ICD-10-CM

## 2017-10-04 DIAGNOSIS — E876 Hypokalemia: Secondary | ICD-10-CM

## 2017-10-04 DIAGNOSIS — I700 Atherosclerosis of aorta: Secondary | ICD-10-CM

## 2017-10-04 DIAGNOSIS — J449 Chronic obstructive pulmonary disease, unspecified: Secondary | ICD-10-CM

## 2017-10-04 DIAGNOSIS — J189 Pneumonia, unspecified organism: Secondary | ICD-10-CM | POA: Diagnosis not present

## 2017-10-04 DIAGNOSIS — D500 Iron deficiency anemia secondary to blood loss (chronic): Secondary | ICD-10-CM

## 2017-10-04 LAB — CBC WITH DIFFERENTIAL/PLATELET
BASOS ABS: 11 {cells}/uL (ref 0–200)
Basophils Relative: 0.2 %
EOS PCT: 0 %
Eosinophils Absolute: 0 cells/uL — ABNORMAL LOW (ref 15–500)
HEMATOCRIT: 32.3 % — AB (ref 35.0–45.0)
Hemoglobin: 10.4 g/dL — ABNORMAL LOW (ref 11.7–15.5)
LYMPHS ABS: 694 {cells}/uL — AB (ref 850–3900)
MCH: 27 pg (ref 27.0–33.0)
MCHC: 32.2 g/dL (ref 32.0–36.0)
MCV: 83.9 fL (ref 80.0–100.0)
MONOS PCT: 3 %
MPV: 8.6 fL (ref 7.5–12.5)
NEUTROS PCT: 83.7 %
Neutro Abs: 4436 cells/uL (ref 1500–7800)
Platelets: 570 10*3/uL — ABNORMAL HIGH (ref 140–400)
RBC: 3.85 10*6/uL (ref 3.80–5.10)
RDW: 18.2 % — AB (ref 11.0–15.0)
Total Lymphocyte: 13.1 %
WBC mixed population: 159 cells/uL — ABNORMAL LOW (ref 200–950)
WBC: 5.3 10*3/uL (ref 3.8–10.8)

## 2017-10-04 LAB — HEPATIC FUNCTION PANEL
AG RATIO: 1.6 (calc) (ref 1.0–2.5)
ALT: 9 U/L (ref 6–29)
AST: 13 U/L (ref 10–35)
Albumin: 4 g/dL (ref 3.6–5.1)
Alkaline phosphatase (APISO): 72 U/L (ref 33–130)
BILIRUBIN DIRECT: 0 mg/dL (ref 0.0–0.2)
BILIRUBIN INDIRECT: 0.2 mg/dL (ref 0.2–1.2)
GLOBULIN: 2.5 g/dL (ref 1.9–3.7)
Total Bilirubin: 0.2 mg/dL (ref 0.2–1.2)
Total Protein: 6.5 g/dL (ref 6.1–8.1)

## 2017-10-04 LAB — BASIC METABOLIC PANEL WITH GFR
BUN: 19 mg/dL (ref 7–25)
CO2: 22 mmol/L (ref 20–32)
Calcium: 8.7 mg/dL (ref 8.6–10.4)
Chloride: 104 mmol/L (ref 98–110)
Creat: 0.85 mg/dL (ref 0.50–1.05)
GFR, EST NON AFRICAN AMERICAN: 75 mL/min/{1.73_m2} (ref >=60)
GFR, Est African American: 87 mL/min/{1.73_m2} (ref >=60)
Glucose, Bld: 101 mg/dL — ABNORMAL HIGH (ref 65–99)
Potassium: 4.6 mmol/L (ref 3.5–5.3)
SODIUM: 138 mmol/L (ref 135–146)

## 2017-10-04 MED ORDER — IPRATROPIUM BROMIDE 0.02 % IN SOLN: 0.5000 mg | RESPIRATORY_TRACT | 2 refills | Status: DC | PRN

## 2017-10-04 MED ORDER — ALBUTEROL SULFATE (2.5 MG/3ML) 0.083% IN NEBU: 2.5000 mg | INHALATION_SOLUTION | Freq: Four times a day (QID) | RESPIRATORY_TRACT | 12 refills | Status: DC | PRN

## 2017-10-04 NOTE — Progress Notes (Signed)
Hospital follow up  Assessment and Plan: Hospital visit follow up for  [name] was seen today for follow-up.  Diagnoses and all orders for this visit:  Thoracic aorta atherosclerosis ([place]) Control blood pressure, cholesterol, glucose, increase exercise.  She is not smoking anymore and states she will not smoke again.   Acute respiratory failure with hypoxia ([place]) -     Ambulatory referral to Pulmonology - improved today, no oxygen today and O2 is 95%  Community acquired pneumonia, unspecified laterality -     Ambulatory referral to Pulmonology - continue inhalers, stop smoking  Chronic obstructive pulmonary disease, unspecified COPD type ([place]) -     Ambulatory referral to Pulmonology - will refer for evaluation, discussed advair is daily medication, has tried trelegy in the past and did not like it.  - will send in nebulizer with albuterol  Mild malnutrition (H[facility]) -     Hepatic function panel - add ensure  Anemia, blood loss -     CBC with Differential/Platelet - will check labs, had transfusion in hospital  Hypokalemia -     BASIC METABOLIC PANEL WITH GFR - weight stable, will check labs? Need more potassium    Hospital discharge meds were reviewed, and reconciled with the patient.    There are no discontinued medications.  Over 40 minutes of exam, counseling, chart review, and complex, high/moderate level critical decision making was performed this visit.   Future Appointments  Date Time Provider [place]  12/22/2017 11:00 AM Vicie Mutters, PA-C [facility] None  05/04/2018 10:00 AM [facility] 6 [facility]  05/04/2018 10:45 AM Nickel, Sharmon Leyden, NP [facility]  09/18/2018 10:00 AM Vicie Mutters, PA-C [facility] None  09/20/2018 10:00 AM Vicie Mutters, PA-C [facility] None     HPI 61 y.o.female presents for follow up for transition from recent hospitalization or SNIF stay. Admit date to the hospital was 09/16/17, patient was discharged from the  hospital on 09/23/17 and our clinical staff contacted the office the day after discharge to set up a follow up appointment. The discharge summary, medications, and diagnostic test results were reviewed before meeting with the patient. The patient was admitted for:  She presented to our office on 09/16/2017 with SOB and hypoxia, she was sent to the ER. Found to have pneumonitis, she was placed on bipap and weaned off slowly, on steroids and ABX and needs a follow up with pulmonary. There was also a possible pulmonary edema component, was given lasix and potassium in hospital and discharged on lasix 40 mg and KCL 52meq. She also received a blood transfusion while in the hospital.   Lab Results  Component Value Date   WBC 10.1 09/21/2017   HGB 10.2 (L) 09/21/2017   HCT 33.4 (L) 09/21/2017   MCV 85.0 09/21/2017   PLT 208 09/21/2017   Lab Results  Component Value Date   CREATININE 0.46 09/21/2017   BUN 18 09/21/2017   NA 136 09/21/2017   K 3.3 (L) 09/21/2017   CL 86 (L) 09/21/2017   CO2 34 (H) 09/21/2017   BMI is Body mass index is 18.14 kg/m., she is working on diet and exercise. Wt Readings from Last 3 Encounters:  10/04/17 99 lb 3.2 oz (45 kg)  09/21/17 95 lb 3.8 oz (43.2 kg)  09/16/17 106 lb (48.1 kg)   Home health is not involved.   Images while in the hospital: Ct Head Wo Contrast  Result Date: 09/16/2017 CLINICAL DATA:  Hypoxia, and weakness. EXAM: CT  HEAD WITHOUT CONTRAST TECHNIQUE: Contiguous axial images were obtained from the base of the skull through the vertex without intravenous contrast. COMPARISON:  None. FINDINGS: Brain: No evidence for acute infarction, hemorrhage, mass lesion, hydrocephalus, or extra-axial fluid. Normal cerebral volume. No white matter disease. Vascular: Calcification of the cavernous internal carotid arteries consistent with cerebrovascular atherosclerotic disease. No signs of intracranial large vessel occlusion. Skull: Normal. Negative for fracture or  focal lesion. Sinuses/Orbits: No acute finding.  BILATERAL cataract extraction. Other: None. IMPRESSION: Calcific cerebrovascular atherosclerotic disease. No acute intracranial findings. No signs of large vessel occlusion. Electronically Signed   By: [name] M.D.   On: 09/16/2017 19:44   Ct Angio Chest Pe W And/or Wo Contrast  Result Date: 09/16/2017 CLINICAL DATA:  Hypoxic, shortness of breath, altered mental status, can't breathe, history end-stage COPD, hypertension, smoker EXAM: CT ANGIOGRAPHY CHEST WITH CONTRAST TECHNIQUE: Multidetector CT imaging of the chest was performed using the standard protocol during bolus administration of intravenous contrast. Multiplanar CT image reconstructions and MIPs were obtained to evaluate the vascular anatomy. CONTRAST:  4mL ISOVUE-370 IOPAMIDOL (ISOVUE-370) INJECTION 76% IV COMPARISON:  09/02/2016 FINDINGS: Cardiovascular: Aorta normal caliber without aneurysm or dissection. No pericardial effusion. Pulmonary arteries well opacified and patent. No evidence of pulmonary embolism. Mediastinum/Nodes: Base of cervical region normal appearance. Esophagus unremarkable. No definite thoracic adenopathy. Lungs/Pleura: Diffuse BILATERAL pulmonary infiltrates, mixed interstitial and alveolar, with predominance in the mid to upper lungs. These may represent diffuse pulmonary edema or infection. Unable to exclude small pulmonary nodules in the setting of diffuse infiltrates. No pleural effusion or pneumothorax. Upper Abdomen: At reflux of contrast into IVC and hepatic veins question elevated RIGHT heart pressures. Remaining visualized upper abdomen unremarkable. Musculoskeletal: No acute osseous findings. Review of the MIP images confirms the above findings. IMPRESSION: Diffuse BILATERAL pulmonary infiltrates with predominance in the mid upper lungs question pulmonary edema or infection. No evidence of pulmonary embolism. Electronically Signed   By: [name] M.D.   On:  09/16/2017 15:13   Dg Chest Portable 1 View  Result Date: 09/17/2017 CLINICAL DATA:  Pneumonia EXAM: PORTABLE CHEST 1 VIEW COMPARISON:  09/16/2017 FINDINGS: Diffuse bilateral airspace opacities are again noted, unchanged. Heart is upper limits normal in size. No visible effusions or pneumothorax. No acute bony abnormality. IMPRESSION: Severe diffuse bilateral airspace disease, not significantly changed. Electronically Signed   By: [name] M.D.   On: 09/17/2017 08:03   Dg Chest Port 1 View  Result Date: 09/16/2017 CLINICAL DATA:  Increasing shortness of breath. History of asthma, COPD, hypertension and pneumonia. EXAM: PORTABLE CHEST 1 VIEW COMPARISON:  Radiographs 11/22/2014.  CT 09/02/2016. FINDINGS: 1138 hours. The heart size and mediastinal contours are stable. There are new diffuse perihilar airspace opacities, greater on the left. There is no pleural effusion or pneumothorax. The bones and soft tissues appear unremarkable. Telemetry leads overlie the chest. IMPRESSION: New bilateral perihilar airspace opacities, most consistent with acute pulmonary edema. Atypical infection considered less likely. Electronically Signed   By: [name] M.D.   On: 09/16/2017 12:00    Past Medical History:  Diagnosis Date  . Allergy   . Anemia   . Arthritis   . Asthma   . Blood transfusion without reported diagnosis   . Cataract   . COPD (chronic obstructive pulmonary disease) ([place])   . Duodenal ulcer   . Emphysema of lung ([place])   . Family history of malignant neoplasm of gastrointestinal tract   . GERD (gastroesophageal reflux disease)   .  Hepatitis    had in 6th grade pt unsure which kind  . Hiatal hernia   . HTN (hypertension) 10/03/2014  . Hx of colonic polyps   . Hyperlipemia   . Internal hemorrhoids   . Osteoporosis   . Peripheral vascular disease ([place])   . Pneumonia    hx of  . Shortness of breath dyspnea    with ambulation  . Stroke ([place])   . Vitamin D deficiency       Allergies  Allergen Reactions  . Celebrex [Celecoxib] Other (See Comments)    GI bleed  . Crestor [Rosuvastatin] Other (See Comments)    Made legs hurt  . Penicillins Other (See Comments)    Has patient had a PCN reaction causing immediate rash, facial/tongue/throat swelling, SOB or lightheadedness with hypotension: Yes Has patient had a PCN reaction causing severe rash involving mucus membranes or skin necrosis: No Has patient had a PCN reaction that required hospitalization: No Has patient had a PCN reaction occurring within the last 10 years: Yes If all of the above answers are "NO", then may proceed with Cephalosporin use.   REACTION: whelps  . Pravastatin Other (See Comments)    Muscle aches      Current Outpatient Medications on File Prior to Visit  Medication Sig Dispense Refill  . albuterol (PROVENTIL HFA;VENTOLIN HFA) 108 (90 Base) MCG/ACT inhaler Inhale 2 puffs into the lungs every 6 (six) hours as needed for wheezing or shortness of breath. 1 Inhaler 2  . aspirin EC 81 MG tablet Take 1 tablet (81 mg total) by mouth daily. 30 tablet 0  . ezetimibe (ZETIA) 10 MG tablet Take 1 tablet (10 mg total) by mouth daily. 30 tablet 5  . FeFum-FePoly-FA-B Cmp-C-Biot (INTEGRA PLUS) CAPS Take 1 capsule by mouth daily. 30 capsule 4  . Fluticasone-Salmeterol (ADVAIR DISKUS) 250-50 MCG/DOSE AEPB Inhale 1 puff into the lungs every 12 (twelve) hours.    . furosemide (LASIX) 40 MG tablet Take 1 tablet (40 mg total) by mouth daily. 30 tablet 0  . potassium chloride (K-DUR) 10 MEQ tablet Take 1 tablet (10 mEq total) by mouth daily. 14 tablet 0  . predniSONE (DELTASONE) 20 MG tablet Take 1 tablet (20 mg total) by mouth daily. 14 tablet 0   No current facility-administered medications on file prior to visit.     ROS: all negative except above.   Physical Exam: Filed Weights   10/04/17 1359  Weight: 99 lb 3.2 oz (45 kg)   BP 110/66   Pulse 90   Temp 97.6 F (36.4 C)   Resp 14   Ht  5\' 2"  (1.575 m)   Wt 99 lb 3.2 oz (45 kg)   SpO2 95%   BMI 18.14 kg/m  General Appearance: Malnourished, in no apparent distress.  Eyes: PERRLA, EOMs, conjunctiva no swelling or erythema, normal fundi and vessels.  Sinuses: No Frontal/maxillary tenderness  ENT/Mouth: Ext aud canals clear, normal light reflex with TMs without erythema, bulging. Good dentition. No erythema, swelling, or exudate on post pharynx. Tonsils not swollen or erythematous. Hearing normal.  Neck: Supple, thyroid normal. No bruits  Respiratory: Respiratory effort normal, diffuse decreased breath sounds. Cardio: RRR without murmurs, rubs or gallops. Brisk peripheral pulses without edema.  Chest: symmetric, with normal excursions and percussion.   Abdomen: Soft, nontender, no guarding, rebound, hernias, masses, or organomegaly. .  Lymphatics: Non tender without lymphadenopathy.  Musculoskeletal: Full ROM all peripheral extremities,5/5 strength, and normal gait.  Skin: Well healing carotid  enterectomy scar. Warm, dry without rashes, lesions, ecchymosis. Neuro: Cranial nerves intact, reflexes equal bilaterally. Normal muscle tone, no cerebellar symptoms. Mild tremor/shaking all over, worse when holding object. Good finger to nose.  Sensation intact.  Psych: Awake and oriented X 3, normal affect, Insight and Judgment appropriate.     Vicie Mutters, PA-C 2:21 PM [facility]

## 2017-10-04 NOTE — Patient Instructions (Signed)
Chronic Obstructive Pulmonary Disease Exacerbation Chronic obstructive pulmonary disease (COPD) is a common lung condition in which airflow from the lungs is limited. COPD is a general term that can be used to describe many different lung problems that limit airflow, including chronic bronchitis and emphysema. COPD exacerbations are episodes when breathing symptoms become much worse and require extra treatment. Without treatment, COPD exacerbations can be life threatening, and frequent COPD exacerbations can cause further damage to your lungs. What are the causes?  Respiratory infections.  Exposure to smoke.  Exposure to air pollution, chemical fumes, or dust. Sometimes there is no apparent cause or trigger. What increases the risk?  Smoking cigarettes.  Older age.  Frequent prior COPD exacerbations. What are the signs or symptoms?  Increased coughing.  Increased thick spit (sputum) production.  Increased wheezing.  Increased shortness of breath.  Rapid breathing.  Chest tightness. How is this diagnosed? Your medical history, a physical exam, and tests will help your health care provider make a diagnosis. Tests may include:  A chest X-ray.  Basic lab tests.  Sputum testing.  An arterial blood gas test.  How is this treated? Depending on the severity of your COPD exacerbation, you may need to be admitted to a hospital for treatment. Some of the treatments commonly used to treat COPD exacerbations are:  Antibiotic medicines.  Bronchodilators. These are drugs that expand the air passages. They may be given with an inhaler or nebulizer. Spacer devices may be needed to help improve drug delivery.  Corticosteroid medicines.  Supplemental oxygen therapy.  Airway clearing techniques, such as noninvasive ventilation (NIV) and positive expiratory pressure (PEP). These provide respiratory support through a mask or other noninvasive device.  Follow these instructions at  home:  Do not smoke. Quitting smoking is very important to prevent COPD from getting worse and exacerbations from happening as often.  Avoid exposure to all substances that irritate the airway, especially to tobacco smoke.  If you were prescribed an antibiotic medicine, finish it all even if you start to feel better.  Take all medicines as directed by your health care provider.It is important to use correct technique with inhaled medicines.  Drink enough fluids to keep your urine clear or pale yellow (unless you have a medical condition that requires fluid restriction).  Use a cool mist vaporizer. This makes it easier to clear your chest when you cough.  If you have a home nebulizer and oxygen, continue to use them as directed.  Maintain all necessary vaccinations to prevent infections.  Exercise regularly.  Eat a healthy diet.  Keep all follow-up appointments as directed by your health care provider. Get help right away if:  You have worsening shortness of breath.  You have trouble talking.  You have severe chest pain.  You have blood in your sputum.  You have a fever.  You have weakness, vomit repeatedly, or faint.  You feel confused.  You continue to get worse. This information is not intended to replace advice given to you by your health care provider. Make sure you discuss any questions you have with your health care provider. Document Released: 08/01/2007 Document Revised: 03/11/2016 Document Reviewed: 06/08/2013 [employer] Interactive Patient Education  2017 [employer]  

## 2017-10-05 ENCOUNTER — Encounter: Payer: Self-pay | Admitting: Internal Medicine

## 2017-10-05 DIAGNOSIS — J449 Chronic obstructive pulmonary disease, unspecified: Secondary | ICD-10-CM | POA: Diagnosis not present

## 2017-10-06 NOTE — Progress Notes (Signed)
Pt aware of lab results & voiced understanding of those results. Pt reports she has an appt with pulmonary Jan 17th 2019

## 2017-10-07 ENCOUNTER — Institutional Professional Consult (permissible substitution): Payer: BLUE CROSS/BLUE SHIELD | Admitting: Pulmonary Disease

## 2017-10-27 ENCOUNTER — Telehealth: Payer: Self-pay | Admitting: Physician Assistant

## 2017-10-27 MED ORDER — PREDNISONE 20 MG PO TABS: ORAL_TABLET | ORAL | 0 refills | Status: DC

## 2017-10-27 MED ORDER — AZITHROMYCIN 250 MG PO TABS: ORAL_TABLET | ORAL | 1 refills | Status: AC

## 2017-10-27 NOTE — Telephone Encounter (Signed)
PT HAS BEEN MADE AWARE OF MEDS THAT WERE SENT TO PHARMACY.

## 2017-10-27 NOTE — Telephone Encounter (Signed)
62 y.o. WF with history of COPD calls with 3 days of URI, cough, SOB, congestion, HA. Likely COPD exacerbation, will send in prednisone and zpak. If not better need OV, likely need inhaler daily.

## 2017-11-03 ENCOUNTER — Ambulatory Visit (INDEPENDENT_AMBULATORY_CARE_PROVIDER_SITE_OTHER): Payer: BLUE CROSS/BLUE SHIELD | Admitting: Pulmonary Disease

## 2017-11-03 ENCOUNTER — Encounter: Payer: Self-pay | Admitting: Pulmonary Disease

## 2017-11-03 ENCOUNTER — Ambulatory Visit (INDEPENDENT_AMBULATORY_CARE_PROVIDER_SITE_OTHER)
Admission: RE | Admit: 2017-11-03 | Discharge: 2017-11-03 | Disposition: A | Discharge status: Home / Self Care | Payer: BLUE CROSS/BLUE SHIELD | Source: Ambulatory Visit | Attending: Pulmonary Disease | Admitting: Pulmonary Disease

## 2017-11-03 VITALS — BP 124/62 | HR 87 | Ht 62.0 in | Wt 102.0 lb

## 2017-11-03 DIAGNOSIS — J449 Chronic obstructive pulmonary disease, unspecified: Secondary | ICD-10-CM

## 2017-11-03 DIAGNOSIS — E440 Moderate protein-calorie malnutrition: Secondary | ICD-10-CM | POA: Diagnosis not present

## 2017-11-03 DIAGNOSIS — J189 Pneumonia, unspecified organism: Secondary | ICD-10-CM

## 2017-11-03 DIAGNOSIS — R0602 Shortness of breath: Secondary | ICD-10-CM | POA: Diagnosis not present

## 2017-11-03 NOTE — Assessment & Plan Note (Signed)
No evidence of malignancy.  She does not have any evidence of overt depression. Likely related to COPD. Hopefully should see some weight gain now that she has quit smoking
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
## 2017-11-03 NOTE — Patient Instructions (Addendum)
Referral to pulmonary rehab program in [place]. Lung function appears decreased at 73%  CXR today

## 2017-11-03 NOTE — Progress Notes (Signed)
Subjective:    Patient ID: [name], female    DOB: Nov 26, 1955, 62 y.o.   MRN: [id number]  HPI    Review of Systems  Constitutional: Negative for fever and unexpected weight change.  HENT: Negative for congestion, dental problem, ear pain, nosebleeds, postnasal drip, rhinorrhea, sinus pressure, sneezing, sore throat and trouble swallowing.   Eyes: Negative for redness and itching.  Respiratory: Positive for cough and shortness of breath. Negative for chest tightness and wheezing.   Cardiovascular: Negative for palpitations and leg swelling.  Gastrointestinal: Negative for nausea and vomiting.  Genitourinary: Negative for dysuria.  Musculoskeletal: Negative for joint swelling.  Skin: Negative for rash.  Neurological: Negative for headaches.  Hematological: Does not bruise/bleed easily.  Psychiatric/Behavioral: Negative for dysphoric mood. The patient is not nervous/anxious.        Objective:   Physical Exam        Assessment & Plan:

## 2017-11-03 NOTE — Assessment & Plan Note (Signed)
Referral to pulmonary rehab program in [place]. Lung function appears decreased at 73%  Consider full PFTs in the future especially if she remains symptomatic to check DLCO.  Continue Advair for now She can use duo nebs on an as-needed basis  If she remains symptomatic, we will consider escalating to L ABA/LAMA combination

## 2017-11-03 NOTE — Assessment & Plan Note (Signed)
CXR today for resolution

## 2017-12-22 ENCOUNTER — Ambulatory Visit: Payer: Self-pay | Admitting: Physician Assistant

## 2017-12-31 ENCOUNTER — Other Ambulatory Visit: Payer: Self-pay | Admitting: Physician Assistant

## 2018-01-11 DIAGNOSIS — H40013 Open angle with borderline findings, low risk, bilateral: Secondary | ICD-10-CM | POA: Diagnosis not present

## 2018-02-01 ENCOUNTER — Ambulatory Visit: Payer: Medicare Other | Admitting: Adult Health

## 2018-02-16 ENCOUNTER — Ambulatory Visit: Payer: BLUE CROSS/BLUE SHIELD | Admitting: Adult Health

## 2018-02-16 ENCOUNTER — Other Ambulatory Visit: Payer: Self-pay | Admitting: Physician Assistant

## 2018-03-07 ENCOUNTER — Ambulatory Visit: Payer: BLUE CROSS/BLUE SHIELD | Admitting: Adult Health

## 2018-03-14 ENCOUNTER — Ambulatory Visit: Payer: BLUE CROSS/BLUE SHIELD | Admitting: Adult Health

## 2018-05-04 ENCOUNTER — Encounter (HOSPITAL_COMMUNITY): Payer: BLUE CROSS/BLUE SHIELD

## 2018-05-04 ENCOUNTER — Ambulatory Visit: Payer: BLUE CROSS/BLUE SHIELD | Admitting: Family

## 2018-05-12 DIAGNOSIS — Z720 Tobacco use: Secondary | ICD-10-CM | POA: Diagnosis not present

## 2018-05-12 DIAGNOSIS — H353134 Nonexudative age-related macular degeneration, bilateral, advanced atrophic with subfoveal involvement: Secondary | ICD-10-CM | POA: Diagnosis not present

## 2018-05-12 DIAGNOSIS — H3582 Retinal ischemia: Secondary | ICD-10-CM | POA: Diagnosis not present

## 2018-05-22 ENCOUNTER — Other Ambulatory Visit: Payer: Self-pay | Admitting: Physician Assistant

## 2018-06-14 ENCOUNTER — Other Ambulatory Visit: Payer: Self-pay | Admitting: Internal Medicine

## 2018-06-16 ENCOUNTER — Ambulatory Visit: Payer: BLUE CROSS/BLUE SHIELD | Admitting: Family

## 2018-06-16 ENCOUNTER — Other Ambulatory Visit: Payer: Self-pay

## 2018-06-16 ENCOUNTER — Encounter (HOSPITAL_COMMUNITY): Payer: BLUE CROSS/BLUE SHIELD

## 2018-06-16 MED ORDER — OMEPRAZOLE 20 MG PO CPDR: DELAYED_RELEASE_CAPSULE | ORAL | 0 refills | Status: DC

## 2018-06-16 NOTE — Telephone Encounter (Signed)
Has upcoming appointment on 06/27/18, refill request for Omeprazole.

## 2018-06-25 NOTE — Progress Notes (Deleted)
Assessment and Plan:     Continue diet and meds as discussed. Further disposition pending results of labs. Over 30 minutes of exam, counseling, chart review, and critical decision making was performed Future Appointments  Date Time Provider [place]  06/27/2018 10:45 AM Vicie Mutters, PA-C [facility] None  07/11/2018 12:00 PM [facility] 4 - SS [facility]  07/11/2018  1:15 PM Nickel, Sharmon Leyden, NP [facility]  09/18/2018 10:00 AM Vicie Mutters, PA-C [facility] None  09/20/2018 10:00 AM Vicie Mutters, PA-C [facility] None    HPI 62 y.o. female  presents for 3 month follow up on hypertension, cholesterol, prediabetes, and vitamin D deficiency.   Her blood pressure has been controlled at home, she has history of hypotension, she is on midodrine 3 x a day, today their BP is    She does not workout. She denies chest pain, shortness of breath, dizziness. She has history of carotid stenosis s/p carotid endarterectomy with Dr. Donnetta Hutching in March. 2017 She has COPD, has quit smoking, on albuterol and advair.  She has history of gastric ulcers. She weighs 100 lbs, has had some night sweats, had cancer work up in Nov, normal CT AB/chest, showed pyelonephritis, thoracoabdominal aortic atherosclerosis, and unchanged/stable lung nodules. Needs MGM, up to date colon/EGD.   She is not on cholesterol medication, she is on the zetia every other day due to cost. Her cholesterol is not at goal. The cholesterol last visit was:   Lab Results  Component Value Date   CHOL 205 (H) 09/14/2017   HDL 35 (L) 09/14/2017   LDLCALC 135 (H) 09/14/2017   TRIG 209 (H) 09/14/2017   CHOLHDL 5.9 (H) 09/14/2017   . Last A1C in the office was:  Lab Results  Component Value Date   HGBA1C 4.8 06/23/2016   Patient is on Vitamin D supplement.   Lab Results  Component Value Date   VD25OH 56 06/23/2016      Current Medications:  Current Outpatient Medications on File Prior to Visit  Medication Sig Dispense  Refill  . albuterol (PROVENTIL HFA;VENTOLIN HFA) 108 (90 Base) MCG/ACT inhaler Inhale 2 puffs into the lungs every 6 (six) hours as needed for wheezing or shortness of breath. 1 Inhaler 2  . albuterol (PROVENTIL) (2.5 MG/3ML) 0.083% nebulizer solution Take 3 mLs (2.5 mg total) by nebulization every 6 (six) hours as needed for wheezing or shortness of breath. 75 mL 12  . aspirin EC 81 MG tablet Take 1 tablet (81 mg total) by mouth daily. 30 tablet 0  . ezetimibe (ZETIA) 10 MG tablet Take 1 tablet (10 mg total) by mouth daily. 30 tablet 5  . FeFum-FePoly-FA-B Cmp-C-Biot (INTEGRA PLUS) CAPS Take 1 capsule by mouth daily. 30 capsule 4  . Fluticasone-Salmeterol (ADVAIR DISKUS) 250-50 MCG/DOSE AEPB Inhale 1 puff into the lungs every 12 (twelve) hours.    . furosemide (LASIX) 40 MG tablet Take 1 tablet (40 mg total) by mouth daily. 30 tablet 0  . ipratropium (ATROVENT) 0.02 % nebulizer solution TAKE 2.5 MLS BY NEBULIZATION EVERY 4 HOURS AS NEEDED FOR WHEEZING OR SHORTNESS OF BREATH. 300 mL 1  . omeprazole (PRILOSEC) 20 MG capsule 1 capsule 2 x /day - to last til Office Visit - No refills til seen 30 capsule 0  . potassium chloride (K-DUR) 10 MEQ tablet Take 1 tablet (10 mEq total) by mouth daily. 14 tablet 0   No current facility-administered medications on file prior to visit.    Medical History:  Past  Medical History:  Diagnosis Date  . Allergy   . Anemia   . Arthritis   . Asthma   . Blood transfusion without reported diagnosis   . Cataract   . COPD (chronic obstructive pulmonary disease) ([place])   . Duodenal ulcer   . Emphysema of lung ([place])   . Family history of malignant neoplasm of gastrointestinal tract   . GERD (gastroesophageal reflux disease)   . Hepatitis    had in 6th grade pt unsure which kind  . Hiatal hernia   . HTN (hypertension) 10/03/2014  . Hx of colonic polyps   . Hyperlipemia   . Internal hemorrhoids   . Osteoporosis   . Peripheral vascular disease ([place])   . Pneumonia     hx of  . Shortness of breath dyspnea    with ambulation  . Stroke ([place])   . Vitamin D deficiency    Allergies:  Allergies  Allergen Reactions  . Celebrex [Celecoxib] Other (See Comments)    GI bleed  . Crestor [Rosuvastatin] Other (See Comments)    Made legs hurt  . Penicillins Other (See Comments)    Has patient had a PCN reaction causing immediate rash, facial/tongue/throat swelling, SOB or lightheadedness with hypotension: Yes Has patient had a PCN reaction causing severe rash involving mucus membranes or skin necrosis: No Has patient had a PCN reaction that required hospitalization: No Has patient had a PCN reaction occurring within the last 10 years: Yes If all of the above answers are "NO", then may proceed with Cephalosporin use.   REACTION: whelps  . Pravastatin Other (See Comments)    Muscle aches    Review of Systems:  Review of Systems  Constitutional: Positive for diaphoresis (at night occ). Negative for chills, fever, malaise/fatigue and weight loss.  HENT: Negative.   Eyes: Negative.   Respiratory: Negative.   Cardiovascular: Negative.   Gastrointestinal: Negative.   Genitourinary: Negative.   Musculoskeletal: Negative.   Skin: Negative.   Neurological: Negative.  Negative for weakness.  Endo/Heme/Allergies: Negative.   Psychiatric/Behavioral: Negative.     Family history- Review and unchanged Social history- Review and unchanged Physical Exam: There were no vitals taken for this visit. Wt Readings from Last 3 Encounters:  11/03/17 102 lb (46.3 kg)  10/04/17 99 lb 3.2 oz (45 kg)  09/21/17 95 lb 3.8 oz (43.2 kg)   General Appearance: Well nourished, in no apparent distress. Eyes: PERRLA, EOMs, conjunctiva no swelling or erythema Sinuses: No Frontal/maxillary tenderness ENT/Mouth: Ext aud canals clear, TMs without erythema, bulging. No erythema, swelling, or exudate on post pharynx.  Tonsils not swollen or erythematous. Hearing normal.  Neck:  Supple, thyroid normal.  Respiratory: Respiratory effort normal, BS equal bilaterally without rales, rhonchi, wheezing or stridor.  Cardio: RRR with no MRGs. Brisk peripheral pulses without edema.  Abdomen: Soft, + BS,  Non tender, no guarding, rebound, hernias, masses. Lymphatics: Non tender without lymphadenopathy.  Musculoskeletal: Full ROM, 5/5 strength, Normal gait Skin: Warm, dry without rashes, lesions, ecchymosis.  Neuro: Cranial nerves intact. Normal muscle tone, no cerebellar symptoms. Psych: Awake and oriented X 3, normal affect, Insight and Judgment appropriate.    Vicie Mutters, PA-C 6:07 AM [facility]

## 2018-06-27 ENCOUNTER — Ambulatory Visit: Payer: Self-pay | Admitting: Adult Health

## 2018-06-27 ENCOUNTER — Ambulatory Visit: Payer: Self-pay | Admitting: Physician Assistant

## 2018-07-03 ENCOUNTER — Encounter: Payer: Self-pay | Admitting: Physician Assistant

## 2018-07-03 NOTE — Progress Notes (Signed)
FOLLOW UP  Assessment and Plan:   Diagnoses and all orders for this visit:  Essential hypertension Continue medication Monitor blood pressure at home; call if consistently over 130/80 Continue DASH diet.   Reminder to go to the ER if any CP, SOB, nausea, dizziness, severe HA, changes vision/speech, left arm numbness and tingling and jaw pain.  Thoracic aorta atherosclerosis (HCC) Control blood pressure, cholesterol, glucose, increase exercise.   Chronic obstructive pulmonary disease, unspecified COPD type ([place]) Controlled/stable on inhalers  Gastroesophageal reflux disease, esophagitis presence not specified Well managed on current medications Discussed diet, avoiding triggers and other lifestyle changes  Vitamin D deficiency Hasn't been checked recently; stopped supplement Check levels today and restart supplement as indicated for goal of 70-100  Protein-calorie malnutrition, moderate (HCC)  Check CMP, continue ensure  Mixed hyperlipidemia Statin intolerant, stopped zetia due to ? Myalgias, willing to retry and will call to report progress, discussed goal LDL <70 due to vascular history, will refer to cardiology/lipid clinic to discuss PCSK9 inhibitor Continue low cholesterol diet and exercise.  Check lipid panel.   Medication management CBC, CMP/GFR, magnesium  Iron deficiency anemia secondary to inadequate dietary iron intake CBC  Adult BMI <19 kg/sq m Continue to recommend diet heavy in fruits and veggies and low in animal meats, cheeses, and dairy products, appropriate calorie intake Continue ensure Discuss exercise recommendations routinely Continue to monitor weight at each visit    Continue diet and meds as discussed. Further disposition pending results of labs. Discussed med's effects and SE's.   Over 30 minutes of exam, counseling, chart review, and critical decision making was performed.   Future Appointments  Date Time Provider Vi[place]   07/11/2018 12:00 PM [facility] - [facility]  07/11/2018  1:15 PM Nickel, Sharmon Leyden, NP [facility]  09/18/2018 10:00 AM Vicie Mutters, PA-C [facility] None  09/20/2018 10:00 AM Vicie Mutters, PA-C [facility] None    ----------------------------------------------------------------------------------------------------------------------  HPI 62 y.o. female  presents for 6 month follow up on hypertension, cholesterol, COPD, GERD, weight and vitamin D deficiency. She has COPD and is on Advair/albuterol.  Quit smoking over a year ago, but has a 18 pack year smoking history and had a syncopal episode in 2016, had normal EEG, holter but severe right carotid stenosis and is s/p right CEA with Dr. Donnetta Hutching. She is on bASA. She has hx of GI bleed/ulcers and remains on omeprazole 20 mg daily, GERD symptoms controlled without breakthrough.   She reports breathing is doing well on advair now that weather has cooled down; uses nebulizer rarely if needed.   BMI is Body mass index is 18.55 kg/m., she has been working on diet and exercise, reports appetite is excellent. She is on ensure daily. Trying to regain up to 110 lb.  Wt Readings from Last 3 Encounters:  07/04/18 101 lb 6.4 oz (46 kg)  11/03/17 102 lb (46.3 kg)  10/04/17 99 lb 3.2 oz (45 kg)   Her blood pressure has been controlled at home, today their BP is BP: 110/64  She does workout, walks daily. She denies chest pain, shortness of breath, dizziness.   She was on cholesterol medication Zetia 10 mg daily, stopped due to muscle aches, statin intolerant. Her cholesterol is not at goal. The cholesterol last visit was:   Lab Results  Component Value Date   CHOL 205 (H) 09/14/2017   HDL 35 (L) 09/14/2017   LDLCALC 135 (H) 09/14/2017   TRIG 209 (H) 09/14/2017  CHOLHDL 5.9 (H) 09/14/2017    She has been working on diet and exercise for glucose management, and denies foot ulcerations, increased appetite, nausea, paresthesia of the feet,  polydipsia, polyuria and visual disturbances. Last A1C in the office was:  Lab Results  Component Value Date   HGBA1C 4.8 06/23/2016   Patient is on Vitamin D supplement.   Lab Results  Component Value Date   VD25OH 56 06/23/2016       Current Medications:  Current Outpatient Medications on File Prior to Visit  Medication Sig  . aspirin EC 81 MG tablet Take 1 tablet (81 mg total) by mouth daily.  Marland Kitchen FeFum-FePoly-FA-B Cmp-C-Biot (INTEGRA PLUS) CAPS Take 1 capsule by mouth daily.  . Fluticasone-Salmeterol (ADVAIR DISKUS) 250-50 MCG/DOSE AEPB Inhale 1 puff into the lungs every 12 (twelve) hours.  . furosemide (LASIX) 40 MG tablet Take 1 tablet (40 mg total) by mouth daily.  . IRON, FERROUS SULFATE, PO Take 65 mg by mouth 2 (two) times daily.  . potassium chloride (K-DUR) 10 MEQ tablet Take 1 tablet (10 mEq total) by mouth daily.   No current facility-administered medications on file prior to visit.      Allergies:  Allergies  Allergen Reactions  . Celebrex [Celecoxib] Other (See Comments)    GI bleed  . Crestor [Rosuvastatin] Other (See Comments)    Made legs hurt  . Penicillins Other (See Comments)    Has patient had a PCN reaction causing immediate rash, facial/tongue/throat swelling, SOB or lightheadedness with hypotension: Yes Has patient had a PCN reaction causing severe rash involving mucus membranes or skin necrosis: No Has patient had a PCN reaction that required hospitalization: No Has patient had a PCN reaction occurring within the last 10 years: Yes If all of the above answers are "NO", then may proceed with Cephalosporin use.   REACTION: whelps  . Pravastatin Other (See Comments)    Muscle aches  . Zetia [Ezetimibe]      Medical History:  Past Medical History:  Diagnosis Date  . Allergy   . Anemia   . Arthritis   . Asthma   . Blood transfusion without reported diagnosis   . Cataract   . COPD (chronic obstructive pulmonary disease) ([place])   . Duodenal  ulcer   . Emphysema of lung ([place])   . Family history of malignant neoplasm of gastrointestinal tract   . GERD (gastroesophageal reflux disease)   . Hepatitis    had in 6th grade pt unsure which kind  . Hiatal hernia   . HTN (hypertension) 10/03/2014  . Hx of colonic polyps   . Hyperlipemia   . Internal hemorrhoids   . Osteoporosis   . Peripheral vascular disease ([place])   . Pneumonia    hx of  . Shortness of breath dyspnea    with ambulation  . Stroke ([place])   . Vitamin D deficiency    Family history- Reviewed and unchanged Social history- Reviewed and unchanged   Review of Systems:  Review of Systems  Constitutional: Negative for malaise/fatigue and weight loss.  HENT: Negative for hearing loss and tinnitus.   Eyes: Negative for blurred vision and double vision.  Respiratory: Negative for cough, shortness of breath (with hot/humid weather only) and wheezing.   Cardiovascular: Negative for chest pain, palpitations, orthopnea, claudication and leg swelling.  Gastrointestinal: Negative for abdominal pain, blood in stool, constipation, diarrhea, heartburn, melena, nausea and vomiting.  Genitourinary: Negative.   Musculoskeletal: Negative for joint pain and myalgias.  Skin: Negative for rash.  Neurological: Negative for dizziness, tingling, sensory change, weakness and headaches.  Endo/Heme/Allergies: Negative for polydipsia.  Psychiatric/Behavioral: Negative.   All other systems reviewed and are negative.   Physical Exam: BP 110/64   Pulse 72   Temp (!) 97.5 F (36.4 C)   Ht 5\' 2"  (1.575 m)   Wt 101 lb 6.4 oz (46 kg)   SpO2 96%   BMI 18.55 kg/m  Wt Readings from Last 3 Encounters:  07/04/18 101 lb 6.4 oz (46 kg)  11/03/17 102 lb (46.3 kg)  10/04/17 99 lb 3.2 oz (45 kg)   General Appearance: Thin older adult, appears older than stated age, in no apparent distress. Eyes: PERRLA, EOMs, conjunctiva no swelling or erythema Sinuses: No Frontal/maxillary  tenderness ENT/Mouth: Ext aud canals clear, TMs without erythema, bulging. No erythema, swelling, or exudate on post pharynx.  Tonsils not swollen or erythematous. Hearing normal.  Neck: Supple, thyroid normal.  Respiratory: Respiratory effort normal, BS equal bilaterally without rales, rhonchi, wheezing or stridor.  Cardio: RRR with no MRGs. Brisk peripheral pulses without edema.  Abdomen: Soft, + BS.  Non tender, no guarding, rebound, hernias, masses. Lymphatics: Non tender without lymphadenopathy.  Musculoskeletal: Full ROM, 5/5 strength, Normal gait Skin: Warm, dry without rashes, lesions, ecchymosis.  Neuro: Cranial nerves intact. No cerebellar symptoms.  Psych: Awake and oriented X 3, normal affect, Insight and Judgment appropriate.    Izora Ribas, NP 4:10 PM [facility]

## 2018-07-04 ENCOUNTER — Encounter: Payer: Self-pay | Admitting: Adult Health

## 2018-07-04 ENCOUNTER — Ambulatory Visit (INDEPENDENT_AMBULATORY_CARE_PROVIDER_SITE_OTHER): Payer: BLUE CROSS/BLUE SHIELD | Admitting: Adult Health

## 2018-07-04 VITALS — BP 110/64 | HR 72 | Temp 97.5°F | Ht 62.0 in | Wt 101.4 lb

## 2018-07-04 DIAGNOSIS — E782 Mixed hyperlipidemia: Secondary | ICD-10-CM

## 2018-07-04 DIAGNOSIS — E559 Vitamin D deficiency, unspecified: Secondary | ICD-10-CM | POA: Diagnosis not present

## 2018-07-04 DIAGNOSIS — I10 Essential (primary) hypertension: Secondary | ICD-10-CM | POA: Diagnosis not present

## 2018-07-04 DIAGNOSIS — Z79899 Other long term (current) drug therapy: Secondary | ICD-10-CM | POA: Diagnosis not present

## 2018-07-04 DIAGNOSIS — I700 Atherosclerosis of aorta: Secondary | ICD-10-CM

## 2018-07-04 DIAGNOSIS — J449 Chronic obstructive pulmonary disease, unspecified: Secondary | ICD-10-CM | POA: Diagnosis not present

## 2018-07-04 DIAGNOSIS — K219 Gastro-esophageal reflux disease without esophagitis: Secondary | ICD-10-CM | POA: Diagnosis not present

## 2018-07-04 DIAGNOSIS — E440 Moderate protein-calorie malnutrition: Secondary | ICD-10-CM | POA: Diagnosis not present

## 2018-07-04 DIAGNOSIS — D508 Other iron deficiency anemias: Secondary | ICD-10-CM

## 2018-07-04 DIAGNOSIS — Z681 Body mass index (BMI) 19 or less, adult: Secondary | ICD-10-CM

## 2018-07-04 DIAGNOSIS — J441 Chronic obstructive pulmonary disease with (acute) exacerbation: Secondary | ICD-10-CM

## 2018-07-04 MED ORDER — ALBUTEROL SULFATE (2.5 MG/3ML) 0.083% IN NEBU: 2.5000 mg | INHALATION_SOLUTION | Freq: Four times a day (QID) | RESPIRATORY_TRACT | 12 refills | Status: DC | PRN

## 2018-07-04 MED ORDER — ALBUTEROL SULFATE HFA 108 (90 BASE) MCG/ACT IN AERS: 2.0000 | INHALATION_SPRAY | Freq: Four times a day (QID) | RESPIRATORY_TRACT | 2 refills | Status: DC | PRN

## 2018-07-04 MED ORDER — IPRATROPIUM BROMIDE 0.02 % IN SOLN: RESPIRATORY_TRACT | 1 refills | Status: DC

## 2018-07-04 MED ORDER — OMEPRAZOLE 20 MG PO CPDR: DELAYED_RELEASE_CAPSULE | ORAL | 0 refills | Status: DC

## 2018-07-04 NOTE — Patient Instructions (Addendum)
Goals    . Gain weight    . LDL CALC < 70       Push healthy fats (nuts, olives/olive oil, avocado, fatty fish) and reduce unhealthy fats (animal fats, lard, butter, processed foods - potato chips, packaged pastries, etc)  Please add a daily soluble fiber supplement - such as citrucel or benefiber, and a daily omega 3 supplement (or fish oil supplement)   Fat and Cholesterol Restricted Diet Getting too much fat and cholesterol in your diet may cause health problems. Following this diet helps keep your fat and cholesterol at normal levels. This can keep you from getting sick. What types of fat should I choose?  Choose monosaturated and polyunsaturated fats. These are found in foods such as olive oil, canola oil, flaxseeds, walnuts, almonds, and seeds.  Eat more omega-3 fats. Good choices include salmon, mackerel, sardines, tuna, flaxseed oil, and ground flaxseeds.  Limit saturated fats. These are in animal products such as meats, butter, and cream. They can also be in plant products such as palm oil, palm kernel oil, and coconut oil.  Avoid foods with partially hydrogenated oils in them. These contain trans fats. Examples of foods that have trans fats are stick margarine, some tub margarines, cookies, crackers, and other baked goods. What general guidelines do I need to follow?  Check food labels. Look for the words "trans fat" and "saturated fat."  When preparing a meal: ? Fill half of your plate with vegetables and green salads. ? Fill one fourth of your plate with whole grains. Look for the word "whole" as the first word in the ingredient list. ? Fill one fourth of your plate with lean protein foods.  Eat more foods that have fiber, like apples, carrots, beans, peas, and barley.  Eat more home-cooked foods. Eat less at restaurants and buffets.  Limit or avoid alcohol.  Limit foods high in starch and sugar.  Limit fried foods.  Cook foods without frying them. Baking,  boiling, grilling, and broiling are all great options.  Lose weight if you are overweight. Losing even a small amount of weight can help your overall health. It can also help prevent diseases such as diabetes and heart disease. What foods can I eat? Grains Whole grains, such as whole wheat or whole grain breads, crackers, cereals, and pasta. Unsweetened oatmeal, bulgur, barley, quinoa, or brown rice. Corn or whole wheat flour tortillas. Vegetables Fresh or frozen vegetables (raw, steamed, roasted, or grilled). Green salads. Fruits All fresh, canned (in natural juice), or frozen fruits. Meat and Other Protein Products Ground beef (85% or leaner), grass-fed beef, or beef trimmed of fat. Skinless chicken or [place]. Ground chicken or [place]. Pork trimmed of fat. All fish and seafood. Eggs. Dried beans, peas, or lentils. Unsalted nuts or seeds. Unsalted canned or dry beans. Dairy Low-fat dairy products, such as skim or 1% milk, 2% or reduced-fat cheeses, low-fat ricotta or cottage cheese, or plain low-fat yogurt. Fats and Oils Tub margarines without trans fats. Light or reduced-fat mayonnaise and salad dressings. Avocado. Olive, canola, sesame, or safflower oils. Natural peanut or almond butter (choose ones without added sugar and oil). The items listed above may not be a complete list of recommended foods or beverages. Contact your dietitian for more options. What foods are not recommended? Grains White bread. White pasta. White rice. Cornbread. Bagels, pastries, and croissants. Crackers that contain trans fat. Vegetables White potatoes. Corn. Creamed or fried vegetables. Vegetables in a cheese sauce. Fruits Dried fruits. Canned  fruit in light or heavy syrup. Fruit juice. Meat and Other Protein Products Fatty cuts of meat. Ribs, chicken wings, bacon, sausage, bologna, salami, chitterlings, fatback, hot dogs, bratwurst, and packaged luncheon meats. Liver and organ meats. Dairy Whole or 2% milk,  cream, half-and-half, and cream cheese. Whole milk cheeses. Whole-fat or sweetened yogurt. Full-fat cheeses. Nondairy creamers and whipped toppings. Processed cheese, cheese spreads, or cheese curds. Sweets and Desserts Corn syrup, sugars, honey, and molasses. Candy. Jam and jelly. Syrup. Sweetened cereals. Cookies, pies, cakes, donuts, muffins, and ice cream. Fats and Oils Butter, stick margarine, lard, shortening, ghee, or bacon fat. Coconut, palm kernel, or palm oils. Beverages Alcohol. Sweetened drinks (such as sodas, lemonade, and fruit drinks or punches). The items listed above may not be a complete list of foods and beverages to avoid. Contact your dietitian for more information. This information is not intended to replace advice given to you by your health care provider. Make sure you discuss any questions you have with your health care provider. Document Released: 04/04/2012 Document Revised: 06/10/2016 Document Reviewed: 01/03/2014 Elsevier Interactive Patient Education  2018 [place] injection What is this medicine? EVOLOCUMAB (e voe LOK ue mab) is known as a PCSK9 inhibitor. It is used to lower the level of cholesterol in the blood. It may be used alone or in combination with other cholesterol-lowering drugs. This drug may also be used to reduce the risk of heart attack, stroke, and certain types of heart surgery in patients with heart disease. This medicine may be used for other purposes; ask your health care provider or pharmacist if you have questions. COMMON BRAND NAME(S): REPATHA What should I tell my health care provider before I take this medicine? They need to know if you have any of these conditions: -an unusual or allergic reaction to evolocumab, other medicines, foods, dyes, or preservatives -pregnant or trying to get pregnant -breast-feeding How should I use this medicine? This medicine is for injection under the skin. You will be taught how to  prepare and give this medicine. Use exactly as directed. Take your medicine at regular intervals. Do not take your medicine more often than directed. It is important that you put your used needles and syringes in a special sharps container. Do not put them in a trash can. If you do not have a sharps container, call your pharmacist or health care provider to get one. Talk to your pediatrician regarding the use of this medicine in children. While this drug may be prescribed for children as young as 13 years for selected conditions, precautions do apply. Overdosage: If you think you have taken too much of this medicine contact a poison control center or emergency room at once. NOTE: This medicine is only for you. Do not share this medicine with others. What if I miss a dose? If you miss a dose, take it as soon as you can if there are more than 7 days until the next scheduled dose, or skip the missed dose and take the next dose according to your original schedule. Do not take double or extra doses. What may interact with this medicine? Interactions are not expected. This list may not describe all possible interactions. Give your health care provider a list of all the medicines, herbs, non-prescription drugs, or dietary supplements you use. Also tell them if you smoke, drink alcohol, or use illegal drugs. Some items may interact with your medicine. What should I watch for while using this medicine?  You may need blood work while you are taking this medicine. What side effects may I notice from receiving this medicine? Side effects that you should report to your doctor or health care professional as soon as possible: -allergic reactions like skin rash, itching or hives, swelling of the face, lips, or tongue -signs and symptoms of infection like fever or chills; cough; sore throat; pain or trouble passing urine Side effects that usually do not require medical attention (report to your doctor or health care  professional if they continue or are bothersome): -diarrhea -nausea -muscle pain -pain, redness, or irritation at site where injected This list may not describe all possible side effects. Call your doctor for medical advice about side effects. You may report side effects to FDA at 1-800-FDA-1088. Where should I keep my medicine? Keep out of the reach of children. You will be instructed on how to store this medicine. Throw away any unused medicine after the expiration date on the label. NOTE: This sheet is a summary. It may not cover all possible information. If you have questions about this medicine, talk to your doctor, pharmacist, or health care provider.  2018 Elsevier/Gold Standard (2016-09-20 13:21:53)

## 2018-07-05 LAB — COMPLETE METABOLIC PANEL WITH GFR
AG Ratio: 1.7 (calc) (ref 1.0–2.5)
ALKALINE PHOSPHATASE (APISO): 80 U/L (ref 33–130)
ALT: 7 U/L (ref 6–29)
AST: 16 U/L (ref 10–35)
Albumin: 4.4 g/dL (ref 3.6–5.1)
BILIRUBIN TOTAL: 0.2 mg/dL (ref 0.2–1.2)
BUN/Creatinine Ratio: 39 (calc) — ABNORMAL HIGH (ref 6–22)
BUN: 30 mg/dL — AB (ref 7–25)
CHLORIDE: 105 mmol/L (ref 98–110)
CO2: 26 mmol/L (ref 20–32)
Calcium: 9.4 mg/dL (ref 8.6–10.4)
Creat: 0.77 mg/dL (ref 0.50–0.99)
GFR, EST AFRICAN AMERICAN: 97 mL/min/{1.73_m2} (ref >=60)
GFR, Est Non African American: 84 mL/min/{1.73_m2} (ref >=60)
GLUCOSE: 86 mg/dL (ref 65–99)
Globulin: 2.6 g/dL (calc) (ref 1.9–3.7)
POTASSIUM: 5.3 mmol/L (ref 3.5–5.3)
Sodium: 140 mmol/L (ref 135–146)
TOTAL PROTEIN: 7 g/dL (ref 6.1–8.1)

## 2018-07-05 LAB — VITAMIN D 25 HYDROXY (VIT D DEFICIENCY, FRACTURES): VIT D 25 HYDROXY: 31 ng/mL (ref 30–100)

## 2018-07-05 LAB — LIPID PANEL
Cholesterol: 208 mg/dL — ABNORMAL HIGH (ref <200)
HDL: 43 mg/dL — AB (ref >50)
LDL Cholesterol (Calc): 121 mg/dL (calc) — ABNORMAL HIGH
NON-HDL CHOLESTEROL (CALC): 165 mg/dL — AB (ref <130)
TRIGLYCERIDES: 283 mg/dL — AB (ref <150)
Total CHOL/HDL Ratio: 4.8 (calc) (ref <5.0)

## 2018-07-05 LAB — CBC WITH DIFFERENTIAL/PLATELET
BASOS PCT: 0.9 %
Basophils Absolute: 50 cells/uL (ref 0–200)
Eosinophils Absolute: 129 cells/uL (ref 15–500)
Eosinophils Relative: 2.3 %
HCT: 34.3 % — ABNORMAL LOW (ref 35.0–45.0)
HEMOGLOBIN: 11.5 g/dL — AB (ref 11.7–15.5)
Lymphs Abs: 2218 cells/uL (ref 850–3900)
MCH: 30.7 pg (ref 27.0–33.0)
MCHC: 33.5 g/dL (ref 32.0–36.0)
MCV: 91.5 fL (ref 80.0–100.0)
MONOS PCT: 13.9 %
MPV: 8.8 fL (ref 7.5–12.5)
NEUTROS ABS: 2425 {cells}/uL (ref 1500–7800)
Neutrophils Relative %: 43.3 %
PLATELETS: 274 10*3/uL (ref 140–400)
RBC: 3.75 10*6/uL — ABNORMAL LOW (ref 3.80–5.10)
RDW: 17.6 % — ABNORMAL HIGH (ref 11.0–15.0)
Total Lymphocyte: 39.6 %
WBC mixed population: 778 cells/uL (ref 200–950)
WBC: 5.6 10*3/uL (ref 3.8–10.8)

## 2018-07-05 LAB — MAGNESIUM: MAGNESIUM: 2 mg/dL (ref 1.5–2.5)

## 2018-07-05 LAB — TSH: TSH: 2.68 mIU/L (ref 0.40–4.50)

## 2018-07-11 ENCOUNTER — Encounter (HOSPITAL_COMMUNITY): Payer: BLUE CROSS/BLUE SHIELD

## 2018-07-11 ENCOUNTER — Ambulatory Visit: Payer: BLUE CROSS/BLUE SHIELD | Admitting: Family

## 2018-07-15 ENCOUNTER — Other Ambulatory Visit: Payer: Self-pay | Admitting: Adult Health

## 2018-07-19 DIAGNOSIS — H353122 Nonexudative age-related macular degeneration, left eye, intermediate dry stage: Secondary | ICD-10-CM | POA: Diagnosis not present

## 2018-07-26 ENCOUNTER — Other Ambulatory Visit: Payer: Self-pay | Admitting: Adult Health

## 2018-07-26 ENCOUNTER — Other Ambulatory Visit: Payer: Self-pay

## 2018-07-26 MED ORDER — EZETIMIBE 10 MG PO TABS: 10.0000 mg | ORAL_TABLET | Freq: Every day | ORAL | 5 refills | Status: DC

## 2018-07-26 NOTE — Telephone Encounter (Signed)
Patient is tolerating the Zetia. Refill request for this.

## 2018-08-16 ENCOUNTER — Other Ambulatory Visit: Payer: Self-pay | Admitting: Adult Health

## 2018-08-25 ENCOUNTER — Other Ambulatory Visit: Payer: Self-pay | Admitting: Internal Medicine

## 2018-09-16 ENCOUNTER — Other Ambulatory Visit: Payer: Self-pay | Admitting: Adult Health

## 2018-09-16 DIAGNOSIS — J441 Chronic obstructive pulmonary disease with (acute) exacerbation: Secondary | ICD-10-CM

## 2018-09-17 NOTE — Progress Notes (Signed)
Complete Physical  Assessment and Plan: Essential hypertension - continue medications, DASH diet, exercise and monitor at home. Call if greater than 130/80.  - CBC with Differential/Platelet - BASIC METABOLIC PANEL WITH GFR - Hepatic function panel - TSH - Microalbumin / creatinine urine ratio - Urine Microscopic  Mixed hyperlipidemia -continue medications, check lipids, decrease fatty foods, increase activity.  - Lipid panel   Severe carotid stenosis, asymptomatic, right s/p right carotid endarterectomy and doing very well without   Chronic obstructive pulmonary disease, unspecified COPD, unspecified chronic bronchitis type Stopped smoking continue Adviar/albuterol  Asthma, with COPD Continue breo  COLONIC POLYPS Up to date  Gastroesophageal reflux disease, esophagitis presence not specified Continue PPI/H2 blocker, diet discussed  Upper GI bleed Avoid NSAIDS, has quit smoking, continue medications  Vitamin D deficiency - Vit D  25 hydroxy (rtn osteoporosis monitoring)  Medication management - Magnesium  Thoracic aorta atherosclerosis Control blood pressure, cholesterol, glucose, increase exercise.   Osteoporosis - DEXA off fosamax, get this year WITH MGM  Depression, major, in remission Monitor, no symptoms at this time  Encounter for general adult medical examination with abnormal findings Get MGM will try to schedule for her   Mild malnutrition ([place]) Continue ensure  Iron deficiency anemia secondary to inadequate dietary iron intake -     Iron,Total/Total Iron Binding Cap -     Vitamin B12  Medication management -     Magnesium  Adult BMI <19 kg/sq m Continue ensure   Discussed med's effects and SE's. Screening labs and tests as requested with regular follow-up as recommended. Future Appointments  Date Time Provider [place]  09/25/2019 10:00 AM Vicie Mutters, PA-C [facility] None    HPI  62 y.o. female  presents for a complete  physical.  Her blood pressure has been controlled at home, today their BP is BP: 112/74   Legally blind in right eye, on Tozal, she is following with Dr. Eddie Dibbles. She is unable to get to the [facility] imaging due to her not being able to drive for this right eye.   She has COPD and is on Advair/albuterol.  Quit smoking over a year ago.    She does not workout. She denies chest pain, shortness of breath, dizziness.   s/p right CEA due to syncopal episode in 2016 with Dr. Donnetta Hutching. She is on bASA.   She had DEXA 09/2015, was on fosamax. Has history of cervical compression fractures.  She is on cholesterol medication, ZETIA EVERY OTHER DAY but has intolerance of statins.  Her cholesterol is not at goal of less than 70. The cholesterol last visit was:   Lab Results  Component Value Date   CHOL 208 (H) 07/04/2018   HDL 43 (L) 07/04/2018   LDLCALC 121 (H) 07/04/2018   TRIG 283 (H) 07/04/2018   CHOLHDL 4.8 07/04/2018  Last A1C in the office was:  Lab Results  Component Value Date   HGBA1C 4.8 06/23/2016  Patient is on Vitamin D supplement.   Lab Results  Component Value Date   VD25OH 31 07/04/2018  Last EGD 2015 with Dr. Henrene Pastor that showed gastric ulcers, is on PPI BID.   BMI is Body mass index is 19.75 kg/m., she is drinking ensure/mild and trying to gain weight.  Wt Readings from Last 3 Encounters:  09/18/18 108 lb (49 kg)  07/04/18 101 lb 6.4 oz (46 kg)  11/03/17 102 lb (46.3 kg)    Current Medications:  Current Outpatient Medications on File Prior to Visit  Medication Sig Dispense Refill  . albuterol (PROVENTIL) (2.5 MG/3ML) 0.083% nebulizer solution Take 3 mLs (2.5 mg total) by nebulization every 6 (six) hours as needed for wheezing or shortness of breath. 75 mL 12  . albuterol (VENTOLIN HFA) 108 (90 Base) MCG/ACT inhaler 2 Inhalations 15 to 20  minutes apart every 4 hours to Rescue Asthma 3 Inhaler 0  . aspirin EC 81 MG tablet Take 1 tablet (81 mg total) by mouth daily. 30 tablet 0   . Dietary Management Product (TOZAL PO) Take by mouth.    . ezetimibe (ZETIA) 10 MG tablet Take 1 tablet (10 mg total) by mouth daily. 30 tablet 5  . FeFum-FePoly-FA-B Cmp-C-Biot (INTEGRA PLUS) CAPS Take 1 capsule by mouth daily. 30 capsule 4  . Fluticasone-Salmeterol (ADVAIR DISKUS) 250-50 MCG/DOSE AEPB Inhale 1 puff into the lungs every 12 (twelve) hours.    Marland Kitchen ipratropium (ATROVENT) 0.02 % nebulizer solution TAKE 2.5 MLS BY NEBULIZATION EVERY 4 HOURS AS NEEDED FOR WHEEZING OR SHORTNESS OF BREATH. 250 mL 2  . IRON, FERROUS SULFATE, PO Take 65 mg by mouth 2 (two) times daily.    Marland Kitchen omeprazole (PRILOSEC) 20 MG capsule TAKE ONE CAPSULE BY MOUTH TWICE A DAY *NEEDS OFFICE VISIT 90 capsule 0   No current facility-administered medications on file prior to visit.    Health Maintenance:   Immunization History  Administered Date(s) Administered  . Pneumococcal Polysaccharide-23 12/18/2014  . Pneumococcal-Unspecified 05/18/2001  . Td 12/16/2004  . Tdap 01/02/2015   Tetanus: 2016 Pneumovax: 2016 Prevnar 13: due when 67 Flu vaccine: declines Zostavax: N/A  LMP: s/p hysterectomy Pap: Dr. Harrington Challenger MGM: 12/2012  OVER DUE DEXA: 09/2015 osteoporisis , on fosamax x 2014 for 4 years- needs REPEAT Colonoscopy: 12/2015 EGD: 09/2014 CT chest 11/2014 CXR 11/2014 MRA brain- 08/19/2016 Myocardial perfusion- 2009 Echo: 2017 Carotid US 11/2014 AB [place] 08/2014  Last Dental Exam: None, [facility] Last Eye Exam: Dr. Augusto Gamble  Patient Care Team: Unk Pinto, MD as PCP - General (Internal Medicine) Irene Shipper, MD as Consulting Physician (Gastroenterology) Gus Height, MD as Consulting Physician (Obstetrics and Gynecology)  Dr. Donnetta Hutching  Allergies:  Allergies  Allergen Reactions  . Celebrex [Celecoxib] Other (See Comments)    GI bleed  . Crestor [Rosuvastatin] Other (See Comments)    Made legs hurt  . Penicillins Other (See Comments)    Has patient had a PCN reaction causing immediate rash,  facial/tongue/throat swelling, SOB or lightheadedness with hypotension: Yes Has patient had a PCN reaction causing severe rash involving mucus membranes or skin necrosis: No Has patient had a PCN reaction that required hospitalization: No Has patient had a PCN reaction occurring within the last 10 years: Yes If all of the above answers are "NO", then may proceed with Cephalosporin use.   REACTION: whelps  . Pravastatin Other (See Comments)    Muscle aches   Medical History:  Past Medical History:  Diagnosis Date  . Allergy   . Anemia   . Arthritis   . Asthma   . Blood transfusion without reported diagnosis   . Cataract   . COPD (chronic obstructive pulmonary disease) ([place])   . Duodenal ulcer   . Emphysema of lung ([place])   . Family history of malignant neoplasm of gastrointestinal tract   . GERD (gastroesophageal reflux disease)   . Hepatitis    had in 6th grade pt unsure which kind  . Hiatal hernia   . HTN (hypertension) 10/03/2014  . Hx of colonic  polyps   . Hyperlipemia   . Internal hemorrhoids   . Osteoporosis   . Peripheral vascular disease ([place])   . Pneumonia    hx of  . Shortness of breath dyspnea    with ambulation  . Stroke ([place])   . Vitamin D deficiency    SURGICAL HISTORY She  has a past surgical history that includes Abdominal hysterectomy; Appendectomy; Total hip arthroplasty (Right); Lumbar disc surgery; Elbow surgery (Right); Wrist surgery (Left); Hand surgery (Left); Esophagogastroduodenoscopy (10/2008); Colonoscopy; Upper gastrointestinal endoscopy; trigeminal neuralgia surgery; Endarterectomy (Right, 12/16/2014); and Cataract extraction, bilateral. FAMILY HISTORY Her family history includes AAA (abdominal aortic aneurysm) in her brother and mother; Colon cancer in her unknown relative; Colon polyps in her mother; Diabetes in her maternal grandfather; Heart attack in her brother, father, and mother; Heart disease in her brother, father, maternal grandfather,  maternal grandmother, and mother; Hyperlipidemia in her brother, father, and mother; Hypertension in her brother, father, and mother; Inflammatory bowel disease in her mother; Stroke in her mother; Varicose Veins in her daughter and mother. SOCIAL HISTORY She  reports that she quit smoking about a year ago. Her smoking use included cigarettes. She has a 9.00 pack-year smoking history. She has never used smokeless tobacco. She reports that she drinks alcohol. She reports that she does not use drugs.  Review of Systems: Review of Systems  Constitutional: Negative for chills, diaphoresis, fever, malaise/fatigue and weight loss.  HENT: Positive for hearing loss. Negative for congestion, ear discharge, ear pain, nosebleeds, sinus pain, sore throat and tinnitus.   Eyes: Negative.  Negative for blurred vision and double vision.  Respiratory: Negative for cough, hemoptysis, sputum production, shortness of breath, wheezing and stridor.   Cardiovascular: Negative for chest pain, palpitations, orthopnea, claudication, leg swelling and PND.  Gastrointestinal: Negative.  Negative for abdominal pain, heartburn and nausea.  Genitourinary: Negative.   Musculoskeletal: Negative for back pain, falls, joint pain, myalgias and neck pain.  Skin: Negative.  Negative for itching and rash.  Neurological: Negative for dizziness, tingling, tremors, sensory change, speech change, focal weakness, seizures, loss of consciousness, weakness and headaches.  Psychiatric/Behavioral: Negative.     Physical Exam: Estimated body mass index is 19.75 kg/m as calculated from the following:   Height as of 07/04/18: 5\' 2"  (1.575 m).   Weight as of this encounter: 108 lb (49 kg). BP 112/74   Pulse 88   Temp 98.6 F (37 C)   Wt 108 lb (49 kg)   SpO2 96%   BMI 19.75 kg/m  General Appearance: Well nourished, in no apparent distress.  Eyes: PERRLA, EOMs, conjunctiva no swelling or erythema, normal fundi and vessels.  Sinuses: No  Frontal/maxillary tenderness  ENT/Mouth: Ext aud canals clear, normal light reflex with TMs without erythema, bulging. Good dentition. No erythema, swelling, or exudate on post pharynx. Tonsils not swollen or erythematous. Hearing normal.  Neck: Supple, thyroid normal. No bruits  Respiratory: Respiratory effort normal, diffuse decreased breath sounds with rhonchi/wheezing, improving after breathing treatment.   Cardio: RRR without murmurs, rubs or gallops. Brisk peripheral pulses without edema.  Chest: symmetric, with normal excursions and percussion.  Breasts: declines.  Abdomen: Soft, nontender, no guarding, rebound, hernias, masses, or organomegaly. .  Lymphatics: Non tender without lymphadenopathy.  Genitourinary: declines Musculoskeletal: Full ROM all peripheral extremities,5/5 strength, and normal gait.  Skin: Well healing carotid enterectomy scar. Warm, dry without rashes, lesions, ecchymosis. Neuro: Cranial nerves intact, reflexes equal bilaterally. Normal muscle tone, no cerebellar symptoms.Sensation intact.  Psych:  Awake and oriented X 3, normal affect, Insight and Judgment appropriate.   EKG: WNL no changes AORTA SCAN: declines  Vicie Mutters 10:24 AM [facility]

## 2018-09-18 ENCOUNTER — Encounter: Payer: Self-pay | Admitting: Physician Assistant

## 2018-09-18 ENCOUNTER — Ambulatory Visit (INDEPENDENT_AMBULATORY_CARE_PROVIDER_SITE_OTHER): Payer: BLUE CROSS/BLUE SHIELD | Admitting: Physician Assistant

## 2018-09-18 VITALS — BP 112/74 | HR 88 | Temp 98.6°F | Wt 108.0 lb

## 2018-09-18 DIAGNOSIS — Z136 Encounter for screening for cardiovascular disorders: Secondary | ICD-10-CM | POA: Diagnosis not present

## 2018-09-18 DIAGNOSIS — F325 Major depressive disorder, single episode, in full remission: Secondary | ICD-10-CM

## 2018-09-18 DIAGNOSIS — E559 Vitamin D deficiency, unspecified: Secondary | ICD-10-CM

## 2018-09-18 DIAGNOSIS — Z79899 Other long term (current) drug therapy: Secondary | ICD-10-CM | POA: Diagnosis not present

## 2018-09-18 DIAGNOSIS — Z1389 Encounter for screening for other disorder: Secondary | ICD-10-CM | POA: Diagnosis not present

## 2018-09-18 DIAGNOSIS — Z130 Encounter for screening for diseases of the blood and blood-forming organs and certain disorders involving the immune mechanism: Secondary | ICD-10-CM

## 2018-09-18 DIAGNOSIS — Z789 Other specified health status: Secondary | ICD-10-CM

## 2018-09-18 DIAGNOSIS — I6521 Occlusion and stenosis of right carotid artery: Secondary | ICD-10-CM

## 2018-09-18 DIAGNOSIS — I10 Essential (primary) hypertension: Secondary | ICD-10-CM | POA: Diagnosis not present

## 2018-09-18 DIAGNOSIS — Z13220 Encounter for screening for lipoid disorders: Secondary | ICD-10-CM | POA: Diagnosis not present

## 2018-09-18 DIAGNOSIS — G459 Transient cerebral ischemic attack, unspecified: Secondary | ICD-10-CM

## 2018-09-18 DIAGNOSIS — Z1329 Encounter for screening for other suspected endocrine disorder: Secondary | ICD-10-CM

## 2018-09-18 DIAGNOSIS — M810 Age-related osteoporosis without current pathological fracture: Secondary | ICD-10-CM

## 2018-09-18 DIAGNOSIS — K922 Gastrointestinal hemorrhage, unspecified: Secondary | ICD-10-CM

## 2018-09-18 DIAGNOSIS — J449 Chronic obstructive pulmonary disease, unspecified: Secondary | ICD-10-CM

## 2018-09-18 DIAGNOSIS — Z0001 Encounter for general adult medical examination with abnormal findings: Secondary | ICD-10-CM

## 2018-09-18 DIAGNOSIS — E782 Mixed hyperlipidemia: Secondary | ICD-10-CM

## 2018-09-18 DIAGNOSIS — Z0000 Encounter for general adult medical examination without abnormal findings: Secondary | ICD-10-CM | POA: Diagnosis not present

## 2018-09-18 DIAGNOSIS — K219 Gastro-esophageal reflux disease without esophagitis: Secondary | ICD-10-CM

## 2018-09-18 DIAGNOSIS — E440 Moderate protein-calorie malnutrition: Secondary | ICD-10-CM

## 2018-09-18 DIAGNOSIS — I700 Atherosclerosis of aorta: Secondary | ICD-10-CM

## 2018-09-18 DIAGNOSIS — D508 Other iron deficiency anemias: Secondary | ICD-10-CM

## 2018-09-18 NOTE — Patient Instructions (Signed)
WEIGHT GAIN  Try to make sure you are eating plenty of high calorie dense foods like: Avocado Nuts Peanut butter Oatmeal Dates Cottage cheese [place] yogurt Protein powder  WOMEN AND HEART ATTACKS  Being a woman you may not have the typical symptoms of a heart attack.  You may not have any pain OR you may have atypical pain such as jaw pain, upper back pain, arm pain, "my bra feels to tight" and you will often have symptoms with it like below.  Symptoms for a heart attack will likely occur when you exert your self or exercise and include: Shortness of breath Sweating Nausea Dizziness Fast or irregular heart beats Fatigue   It makes me feel better if my patients get their heart rate up with exercise once or twice a week and pay close attention to your body. If there is ANY change in your exercise capacity or if you have symptoms above, please STOP and call 911 or call to come to the office.   Here is some information to help you keep your heart healthy: Move it! - Aim for 30 mins of activity every day. Take it slowly at first. Talk to [place] before starting any new exercise program.   Lose it.  -Body Mass Index (BMI) can indicate if you need to lose weight. A healthy range is 18.5-24.9. For a BMI calculator, go to [employer]  Waist Management -Excess abdominal fat is a risk factor for heart disease, diabetes, asthma, stroke and more. Ideal waist circumference is less than 35" for women and less than 40" for men.   Eat Right -focus on fruits, vegetables, whole grains, and meals you make yourself. Avoid foods with trans fat and high sugar/sodium content.   Snooze or Snore? - Loud snoring can be a sign of sleep apnea, a significant risk factor for high blood pressure, heart attach, stroke, and heart arrhythmias.  Kick the habit -Quit Smoking! Avoid second hand smoke. A single cigarette raises your blood pressure for 20 mins and increases the risk of heart attack and stroke for  the next 24 hours.   Are Aspirin and Supplements right for you? -Add ENTERIC COATED low dose 81 mg Aspirin daily OR can do every other day if you have easy bruising to protect your heart and head. As well as to reduce risk of Colon Cancer by 20 %, Skin Cancer by 26 % , Melanoma by 46% and Pancreatic cancer by 60%  Say "No to Stress -There may be little you can do about problems that cause stress. However, techniques such as long walks, meditation, and exercise can help you manage it.   Start Now! - Make changes one at a time and set reasonable goals to increase your likelihood of success.

## 2018-09-19 LAB — URINALYSIS, ROUTINE W REFLEX MICROSCOPIC
Bilirubin Urine: NEGATIVE
Glucose, UA: NEGATIVE
Hgb urine dipstick: NEGATIVE
Ketones, ur: NEGATIVE
Leukocytes, UA: NEGATIVE
Nitrite: NEGATIVE
PROTEIN: NEGATIVE
Specific Gravity, Urine: 1.016 (ref 1.001–1.03)
pH: <=5 (ref 5.0–8.0)

## 2018-09-19 LAB — TSH: TSH: 3.08 mIU/L (ref 0.40–4.50)

## 2018-09-19 LAB — COMPLETE METABOLIC PANEL WITH GFR
AG Ratio: 1.6 (calc) (ref 1.0–2.5)
ALT: 11 U/L (ref 6–29)
AST: 20 U/L (ref 10–35)
Albumin: 4.5 g/dL (ref 3.6–5.1)
Alkaline phosphatase (APISO): 64 U/L (ref 33–130)
BUN / CREAT RATIO: 44 (calc) — AB (ref 6–22)
BUN: 34 mg/dL — ABNORMAL HIGH (ref 7–25)
CO2: 23 mmol/L (ref 20–32)
Calcium: 9.5 mg/dL (ref 8.6–10.4)
Chloride: 107 mmol/L (ref 98–110)
Creat: 0.77 mg/dL (ref 0.50–0.99)
GFR, Est African American: 97 mL/min/{1.73_m2} (ref >=60)
GFR, Est Non African American: 84 mL/min/{1.73_m2} (ref >=60)
Globulin: 2.8 g/dL (calc) (ref 1.9–3.7)
Glucose, Bld: 85 mg/dL (ref 65–99)
Potassium: 5.4 mmol/L — ABNORMAL HIGH (ref 3.5–5.3)
Sodium: 140 mmol/L (ref 135–146)
Total Bilirubin: 0.2 mg/dL (ref 0.2–1.2)
Total Protein: 7.3 g/dL (ref 6.1–8.1)

## 2018-09-19 LAB — LIPID PANEL
Cholesterol: 208 mg/dL — ABNORMAL HIGH (ref <200)
HDL: 42 mg/dL — ABNORMAL LOW (ref >50)
LDL Cholesterol (Calc): 137 mg/dL (calc) — ABNORMAL HIGH
NON-HDL CHOLESTEROL (CALC): 166 mg/dL — AB (ref <130)
Total CHOL/HDL Ratio: 5 (calc) — ABNORMAL HIGH (ref <5.0)
Triglycerides: 155 mg/dL — ABNORMAL HIGH (ref <150)

## 2018-09-19 LAB — CBC WITH DIFFERENTIAL/PLATELET
Basophils Absolute: 41 cells/uL (ref 0–200)
Basophils Relative: 0.6 %
EOS ABS: 150 {cells}/uL (ref 15–500)
Eosinophils Relative: 2.2 %
HCT: 33.6 % — ABNORMAL LOW (ref 35.0–45.0)
Hemoglobin: 11.4 g/dL — ABNORMAL LOW (ref 11.7–15.5)
Lymphs Abs: 1319 cells/uL (ref 850–3900)
MCH: 30.3 pg (ref 27.0–33.0)
MCHC: 33.9 g/dL (ref 32.0–36.0)
MCV: 89.4 fL (ref 80.0–100.0)
MPV: 9.3 fL (ref 7.5–12.5)
Monocytes Relative: 7.5 %
Neutro Abs: 4780 cells/uL (ref 1500–7800)
Neutrophils Relative %: 70.3 %
Platelets: 329 10*3/uL (ref 140–400)
RBC: 3.76 10*6/uL — ABNORMAL LOW (ref 3.80–5.10)
RDW: 14.7 % (ref 11.0–15.0)
Total Lymphocyte: 19.4 %
WBC mixed population: 510 cells/uL (ref 200–950)
WBC: 6.8 10*3/uL (ref 3.8–10.8)

## 2018-09-19 LAB — MICROALBUMIN / CREATININE URINE RATIO
Creatinine, Urine: 26 mg/dL (ref 20–275)
Microalb Creat Ratio: 62 mcg/mg creat — ABNORMAL HIGH (ref <30)
Microalb, Ur: 1.6 mg/dL

## 2018-09-19 LAB — VITAMIN D 25 HYDROXY (VIT D DEFICIENCY, FRACTURES): Vit D, 25-Hydroxy: 56 ng/mL (ref 30–100)

## 2018-09-19 LAB — MAGNESIUM: Magnesium: 2 mg/dL (ref 1.5–2.5)

## 2018-09-19 LAB — IRON, TOTAL/TOTAL IRON BINDING CAP
%SAT: 12 % (calc) — ABNORMAL LOW (ref 16–45)
Iron: 63 ug/dL (ref 45–160)
TIBC: 516 mcg/dL (calc) — ABNORMAL HIGH (ref 250–450)

## 2018-09-19 LAB — VITAMIN B12: Vitamin B-12: 789 pg/mL (ref 200–1100)

## 2018-09-20 ENCOUNTER — Encounter: Payer: Self-pay | Admitting: Physician Assistant

## 2018-09-21 ENCOUNTER — Other Ambulatory Visit: Payer: Self-pay | Admitting: Physician Assistant

## 2018-09-21 DIAGNOSIS — Z1231 Encounter for screening mammogram for malignant neoplasm of breast: Secondary | ICD-10-CM

## 2018-10-06 ENCOUNTER — Other Ambulatory Visit: Payer: Self-pay | Admitting: Internal Medicine

## 2018-11-10 DIAGNOSIS — H359 Unspecified retinal disorder: Secondary | ICD-10-CM | POA: Diagnosis not present

## 2018-11-10 DIAGNOSIS — Z720 Tobacco use: Secondary | ICD-10-CM | POA: Diagnosis not present

## 2018-11-10 DIAGNOSIS — H353134 Nonexudative age-related macular degeneration, bilateral, advanced atrophic with subfoveal involvement: Secondary | ICD-10-CM | POA: Diagnosis not present

## 2018-11-10 DIAGNOSIS — H3582 Retinal ischemia: Secondary | ICD-10-CM | POA: Diagnosis not present

## 2018-11-16 ENCOUNTER — Other Ambulatory Visit: Payer: BLUE CROSS/BLUE SHIELD

## 2018-11-16 ENCOUNTER — Ambulatory Visit: Payer: BLUE CROSS/BLUE SHIELD

## 2018-11-24 ENCOUNTER — Other Ambulatory Visit: Payer: Self-pay | Admitting: Adult Health

## 2018-12-26 ENCOUNTER — Ambulatory Visit: Payer: Self-pay | Admitting: Physician Assistant

## 2018-12-26 NOTE — Progress Notes (Signed)
FOLLOW UP  Assessment and Plan:   Essential hypertension -     CBC with Differential/Platelet -     COMPLETE METABOLIC PANEL WITH GFR -     TSH  Thoracic aorta atherosclerosis (HCC)  Chronic obstructive pulmonary disease, unspecified COPD type (HCC)  Asthma-COPD overlap syndrome (HCC)  Mixed hyperlipidemia -     Lipid panel  Depression, major, in remission ([place])  Protein-calorie malnutrition, moderate (HCC)  Medication management -     Magnesium  Vitamin D deficiency -     VITAMIN D 25 Hydroxy (Vit-D Deficiency, Fractures)  Iron deficiency -     Iron,Total/Total Iron Binding Cap -     Ferritin   Continue diet and meds as discussed. Further disposition pending results of labs. Discussed med's effects and SE's.   Over 30 minutes of exam, counseling, chart review, and critical decision making was performed.   Future Appointments  Date Time Provider [place]  01/11/2019 10:30 AM [facility] MM 3 [facility]  01/11/2019 11:00 AM [facility] DX DEXA 1 [facility]  09/25/2019 10:00 AM Vicie Mutters, PA-C [facility] None    ----------------------------------------------------------------------------------------------------------------------  HPI 63 y.o. female  presents for 6 month follow up on hypertension, cholesterol, COPD, GERD, weight and vitamin D deficiency. She has COPD and is on Advair/albuterol.    Quit smoking over a year ago, but has a 18 pack year smoking history and had a syncopal episode in 2016, had normal EEG, holter but severe right carotid stenosis and is s/p right CEA with Dr. Donnetta Hutching. She is on bASA. She has hx of GI bleed/ulcers and remains on omeprazole 20 mg daily, GERD symptoms controlled without breakthrough.   She reports breathing is doing well on advair now that weather has cooled down; uses nebulizer rarely if needed.   BMI is Body mass index is 20.38 kg/m., she has been working on diet and exercise, reports appetite is  excellent. She is on ensure daily.  Wt Readings from Last 3 Encounters:  12/27/18 111 lb 6.4 oz (50.5 kg)  09/18/18 108 lb (49 kg)  07/04/18 101 lb 6.4 oz (46 kg)   Her blood pressure has been controlled at home, today their BP is BP: 118/68  She does workout, walks daily. She denies chest pain, shortness of breath, dizziness.   She was on cholesterol medication Zetia 10 mg daily, statin stopped due to muscle aches, statin intolerant. Her cholesterol is not at goal. The cholesterol last visit was:   Lab Results  Component Value Date   CHOL 208 (H) 09/18/2018   HDL 42 (L) 09/18/2018   LDLCALC 137 (H) 09/18/2018   TRIG 155 (H) 09/18/2018   CHOLHDL 5.0 (H) 09/18/2018    She has been working on diet and exercise for glucose management, and denies foot ulcerations, increased appetite, nausea, paresthesia of the feet, polydipsia, polyuria and visual disturbances. Last A1C in the office was:  Lab Results  Component Value Date   HGBA1C 4.8 06/23/2016   Patient is on Vitamin D supplement.   Lab Results  Component Value Date   VD25OH 56 09/18/2018       Current Medications:  Current Outpatient Medications on File Prior to Visit  Medication Sig  . albuterol (PROVENTIL) (2.5 MG/3ML) 0.083% nebulizer solution Take 3 mLs (2.5 mg total) by nebulization every 6 (six) hours as needed for wheezing or shortness of breath.  Marland Kitchen albuterol (VENTOLIN HFA) 108 (90 Base) MCG/ACT inhaler 2 Inhalations 15 to 20  minutes apart  every 4 hours to Rescue Asthma  . aspirin EC 81 MG tablet Take 1 tablet (81 mg total) by mouth daily.  . Dietary Management Product (TOZAL PO) Take by mouth.  . ezetimibe (ZETIA) 10 MG tablet TAKE 1 TABLET BY MOUTH EVERY DAY  . FeFum-FePoly-FA-B Cmp-C-Biot (INTEGRA PLUS) CAPS Take 1 capsule by mouth daily.  . Fluticasone-Salmeterol (ADVAIR DISKUS) 250-50 MCG/DOSE AEPB Inhale 1 puff into the lungs every 12 (twelve) hours.  Marland Kitchen ipratropium (ATROVENT) 0.02 % nebulizer solution TAKE 2.5  MLS BY NEBULIZATION EVERY 4 HOURS AS NEEDED FOR WHEEZING OR SHORTNESS OF BREATH.  . IRON, FERROUS SULFATE, PO Take 65 mg by mouth 2 (two) times daily.  Marland Kitchen omeprazole (PRILOSEC) 20 MG capsule TAKE 1 CAPSULE BY MOUTH TWICE A DAY *NEEDS OFFICE VISIT   No current facility-administered medications on file prior to visit.      Allergies:  Allergies  Allergen Reactions  . Celebrex [Celecoxib] Other (See Comments)    GI bleed  . Crestor [Rosuvastatin] Other (See Comments)    Made legs hurt  . Penicillins Other (See Comments)    Has patient had a PCN reaction causing immediate rash, facial/tongue/throat swelling, SOB or lightheadedness with hypotension: Yes Has patient had a PCN reaction causing severe rash involving mucus membranes or skin necrosis: No Has patient had a PCN reaction that required hospitalization: No Has patient had a PCN reaction occurring within the last 10 years: Yes If all of the above answers are "NO", then may proceed with Cephalosporin use.   REACTION: whelps  . Pravastatin Other (See Comments)    Muscle aches     Medical History:  Past Medical History:  Diagnosis Date  . Allergy   . Anemia   . Arthritis   . Asthma   . Blood transfusion without reported diagnosis   . Cataract   . COPD (chronic obstructive pulmonary disease) ([place])   . Duodenal ulcer   . Emphysema of lung ([place])   . Family history of malignant neoplasm of gastrointestinal tract   . GERD (gastroesophageal reflux disease)   . Hepatitis    had in 6th grade pt unsure which kind  . Hiatal hernia   . HTN (hypertension) 10/03/2014  . Hx of colonic polyps   . Hyperlipemia   . Internal hemorrhoids   . Osteoporosis   . Peripheral vascular disease ([place])   . Pneumonia    hx of  . Shortness of breath dyspnea    with ambulation  . Stroke ([place])   . Vitamin D deficiency    Family history- Reviewed and unchanged Social history- Reviewed and unchanged   Review of Systems:  Review of Systems   Constitutional: Negative for malaise/fatigue and weight loss.  HENT: Negative for hearing loss and tinnitus.   Eyes: Negative for blurred vision and double vision.  Respiratory: Negative for cough, shortness of breath (with hot/humid weather only) and wheezing.   Cardiovascular: Negative for chest pain, palpitations, orthopnea, claudication and leg swelling.  Gastrointestinal: Negative for abdominal pain, blood in stool, constipation, diarrhea, heartburn, melena, nausea and vomiting.  Genitourinary: Negative.   Musculoskeletal: Negative for joint pain and myalgias.  Skin: Negative for rash.  Neurological: Negative for dizziness, tingling, sensory change, weakness and headaches.  Endo/Heme/Allergies: Negative for polydipsia.  Psychiatric/Behavioral: Negative.   All other systems reviewed and are negative.   Physical Exam: BP 118/68   Pulse 86   Temp 98.6 F (37 C)   Ht 5\' 2"  (1.575 m)   Wt  111 lb 6.4 oz (50.5 kg)   SpO2 97%   BMI 20.38 kg/m  Wt Readings from Last 3 Encounters:  12/27/18 111 lb 6.4 oz (50.5 kg)  09/18/18 108 lb (49 kg)  07/04/18 101 lb 6.4 oz (46 kg)   General Appearance: Thin older adult, appears older than stated age, in no apparent distress. Eyes: PERRLA, EOMs, conjunctiva no swelling or erythema Sinuses: No Frontal/maxillary tenderness ENT/Mouth: Ext aud canals clear, TMs without erythema, bulging. No erythema, swelling, or exudate on post pharynx.  Tonsils not swollen or erythematous. Hearing normal.  Neck: Supple, thyroid normal.  Respiratory: Respiratory effort normal, BS equal bilaterally without rales, rhonchi, wheezing or stridor.  Cardio: RRR with no MRGs. Brisk peripheral pulses without edema.  Abdomen: Soft, + BS.  Non tender, no guarding, rebound, hernias, masses. Lymphatics: Non tender without lymphadenopathy.  Musculoskeletal: Full ROM, 5/5 strength, Normal gait Skin: Warm, dry without rashes, lesions, ecchymosis.  Neuro: Cranial nerves  intact. No cerebellar symptoms.  Psych: Awake and oriented X 3, normal affect, Insight and Judgment appropriate.    Vicie Mutters, PA-C 10:37 AM [facility]

## 2018-12-27 ENCOUNTER — Encounter: Payer: Self-pay | Admitting: Physician Assistant

## 2018-12-27 ENCOUNTER — Other Ambulatory Visit: Payer: Self-pay

## 2018-12-27 ENCOUNTER — Ambulatory Visit: Payer: BLUE CROSS/BLUE SHIELD | Admitting: Physician Assistant

## 2018-12-27 VITALS — BP 118/68 | HR 86 | Temp 98.6°F | Ht 62.0 in | Wt 111.4 lb

## 2018-12-27 DIAGNOSIS — J449 Chronic obstructive pulmonary disease, unspecified: Secondary | ICD-10-CM

## 2018-12-27 DIAGNOSIS — F325 Major depressive disorder, single episode, in full remission: Secondary | ICD-10-CM

## 2018-12-27 DIAGNOSIS — I700 Atherosclerosis of aorta: Secondary | ICD-10-CM

## 2018-12-27 DIAGNOSIS — E611 Iron deficiency: Secondary | ICD-10-CM | POA: Diagnosis not present

## 2018-12-27 DIAGNOSIS — Z79899 Other long term (current) drug therapy: Secondary | ICD-10-CM | POA: Diagnosis not present

## 2018-12-27 DIAGNOSIS — I10 Essential (primary) hypertension: Secondary | ICD-10-CM

## 2018-12-27 DIAGNOSIS — J4489 Other specified chronic obstructive pulmonary disease: Secondary | ICD-10-CM

## 2018-12-27 DIAGNOSIS — R7309 Other abnormal glucose: Secondary | ICD-10-CM | POA: Diagnosis not present

## 2018-12-27 DIAGNOSIS — E440 Moderate protein-calorie malnutrition: Secondary | ICD-10-CM

## 2018-12-27 DIAGNOSIS — E559 Vitamin D deficiency, unspecified: Secondary | ICD-10-CM

## 2018-12-27 DIAGNOSIS — E782 Mixed hyperlipidemia: Secondary | ICD-10-CM

## 2018-12-27 NOTE — Patient Instructions (Signed)
GENERAL HEALTH GOALS  Know what a healthy weight is for you (roughly BMI <25) and aim to maintain this  Aim for 7+ servings of fruits and vegetables daily  70-80+ fluid ounces of water or unsweet tea for healthy kidneys  Limit to max 1 drink of alcohol per day; avoid smoking/tobacco  Limit animal fats in diet for cholesterol and heart health - choose grass fed whenever available  Avoid highly processed foods, and foods high in saturated/trans fats  Aim for low stress - take time to unwind and care for your mental health  Aim for 150 min of moderate intensity exercise weekly for heart health, and weights twice weekly for bone health  Aim for 7-9 hours of sleep daily  Coronavirus or Covid 19  . You will use the same precautions as you would with the flu virus.  . While we do not have a vaccine against Covid 19 and will not for several years, WE do have a vaccine against the flu. Please get this if you have not yet.  Marland Kitchen Please wash your hands frequently. Wendee Copp your hands before you eat or touch your face.  . Avoid touching your face, eyes, nose, mouth as much as possible.  . Routinely clean frequently touched items such as doorknobs, keyboards, and phones.  . Avoid crowds of people.   To get more information from reputable sources:  You can call this hotline set up by [facility] 1 877 40COVID (17793903009)  You can visit these websites: CDC.gov WHO.int  If you feel that you have come in contact with someone that may be infected with coronavirus.  Please stay at home.  Call our office.  Call the local health department.  [place] [phone])  After hours nurse triage line [phone])

## 2018-12-28 LAB — LIPID PANEL
CHOL/HDL RATIO: 4.9 (calc) (ref <5.0)
Cholesterol: 231 mg/dL — ABNORMAL HIGH (ref <200)
HDL: 47 mg/dL — ABNORMAL LOW (ref >=50)
LDL Cholesterol (Calc): 154 mg/dL (calc) — ABNORMAL HIGH
NON-HDL CHOLESTEROL (CALC): 184 mg/dL — AB (ref <130)
Triglycerides: 163 mg/dL — ABNORMAL HIGH (ref <150)

## 2018-12-28 LAB — VITAMIN D 25 HYDROXY (VIT D DEFICIENCY, FRACTURES): Vit D, 25-Hydroxy: 66 ng/mL (ref 30–100)

## 2018-12-28 LAB — CBC WITH DIFFERENTIAL/PLATELET
Absolute Monocytes: 684 cells/uL (ref 200–950)
Basophils Absolute: 53 cells/uL (ref 0–200)
Basophils Relative: 0.7 %
Eosinophils Absolute: 365 cells/uL (ref 15–500)
Eosinophils Relative: 4.8 %
HCT: 35.6 % (ref 35.0–45.0)
HEMOGLOBIN: 11.9 g/dL (ref 11.7–15.5)
Lymphs Abs: 1976 cells/uL (ref 850–3900)
MCH: 29.5 pg (ref 27.0–33.0)
MCHC: 33.4 g/dL (ref 32.0–36.0)
MCV: 88.3 fL (ref 80.0–100.0)
MPV: 8.8 fL (ref 7.5–12.5)
Monocytes Relative: 9 %
Neutro Abs: 4522 cells/uL (ref 1500–7800)
Neutrophils Relative %: 59.5 %
Platelets: 391 10*3/uL (ref 140–400)
RBC: 4.03 10*6/uL (ref 3.80–5.10)
RDW: 15.9 % — ABNORMAL HIGH (ref 11.0–15.0)
Total Lymphocyte: 26 %
WBC: 7.6 10*3/uL (ref 3.8–10.8)

## 2018-12-28 LAB — COMPLETE METABOLIC PANEL WITH GFR
AG Ratio: 1.7 (calc) (ref 1.0–2.5)
ALT: 12 U/L (ref 6–29)
AST: 20 U/L (ref 10–35)
Albumin: 4.6 g/dL (ref 3.6–5.1)
Alkaline phosphatase (APISO): 88 U/L (ref 37–153)
BUN/Creatinine Ratio: 31 (calc) — ABNORMAL HIGH (ref 6–22)
BUN: 26 mg/dL — ABNORMAL HIGH (ref 7–25)
CO2: 22 mmol/L (ref 20–32)
Calcium: 9.7 mg/dL (ref 8.6–10.4)
Chloride: 107 mmol/L (ref 98–110)
Creat: 0.84 mg/dL (ref 0.50–0.99)
GFR, Est African American: 88 mL/min/{1.73_m2} (ref >=60)
GFR, Est Non African American: 76 mL/min/{1.73_m2} (ref >=60)
GLOBULIN: 2.7 g/dL (ref 1.9–3.7)
Glucose, Bld: 86 mg/dL (ref 65–99)
Potassium: 5.1 mmol/L (ref 3.5–5.3)
Sodium: 140 mmol/L (ref 135–146)
Total Bilirubin: 0.2 mg/dL (ref 0.2–1.2)
Total Protein: 7.3 g/dL (ref 6.1–8.1)

## 2018-12-28 LAB — FERRITIN: Ferritin: 10 ng/mL — ABNORMAL LOW (ref 16–232)

## 2018-12-28 LAB — IRON, TOTAL/TOTAL IRON BINDING CAP
%SAT: 6 % (calc) — ABNORMAL LOW (ref 16–45)
Iron: 32 ug/dL — ABNORMAL LOW (ref 45–160)
TIBC: 494 mcg/dL (calc) — ABNORMAL HIGH (ref 250–450)

## 2018-12-28 LAB — MAGNESIUM: Magnesium: 2 mg/dL (ref 1.5–2.5)

## 2018-12-28 LAB — TSH: TSH: 2.59 mIU/L (ref 0.40–4.50)

## 2019-01-10 ENCOUNTER — Other Ambulatory Visit: Payer: Self-pay | Admitting: Internal Medicine

## 2019-01-11 ENCOUNTER — Ambulatory Visit: Payer: BLUE CROSS/BLUE SHIELD

## 2019-01-11 ENCOUNTER — Other Ambulatory Visit: Payer: BLUE CROSS/BLUE SHIELD

## 2019-03-14 ENCOUNTER — Other Ambulatory Visit: Payer: BLUE CROSS/BLUE SHIELD

## 2019-03-14 ENCOUNTER — Ambulatory Visit: Payer: BLUE CROSS/BLUE SHIELD

## 2019-05-03 ENCOUNTER — Ambulatory Visit: Payer: BLUE CROSS/BLUE SHIELD

## 2019-05-03 ENCOUNTER — Other Ambulatory Visit: Payer: BLUE CROSS/BLUE SHIELD

## 2019-05-29 ENCOUNTER — Ambulatory Visit: Payer: BLUE CROSS/BLUE SHIELD | Admitting: Physician Assistant

## 2019-05-30 NOTE — Progress Notes (Signed)
FOLLOW UP  Assessment and Plan: Essential hypertension -     CBC with Differential/Platelet -     COMPLETE METABOLIC PANEL WITH GFR -     TSH - continue medications, DASH diet, exercise and monitor at home. Call if greater than 130/80.   Thoracic aorta atherosclerosis (HCC) -     Bempedoic Acid-Ezetimibe (NEXLIZET) 180-10 MG TABS; Take 1 tablet by mouth daily. Control blood pressure, cholesterol, glucose, increase exercise.   Mixed hyperlipidemia -     Lipid panel -     Bempedoic Acid-Ezetimibe (NEXLIZET) 180-10 MG TABS; Take 1 tablet by mouth daily. Will try nexilzet since patient is intolerance of several statins and has had PAD s/p CEA  Medication management -     Magnesium  Depression, major, in remission ([place]) Controlled  Protein-calorie malnutrition, moderate (HCC) Continue ensure/boost, weight is up  Iron deficiency anemia secondary to inadequate dietary iron intake -     Iron,Total/Total Iron Binding Cap -     Vitamin B12  Vitamin D deficiency -     VITAMIN D 25 Hydroxy (Vit-D Deficiency, Fractures)  COPD exacerbation (HCC) -     albuterol (VENTOLIN HFA) 108 (90 Base) MCG/ACT inhaler; 2 Inhalations 15 to 20  minutes apart every 4 hours to Rescue Asthma No triggers, well controlled symptoms, cont to monitor   Continue diet and meds as discussed. Further disposition pending results of labs. Discussed med's effects and SE's.   Over 30 minutes of exam, counseling, chart review, and critical decision making was performed.   Future Appointments  Date Time Provider [place]  09/25/2019 10:00 AM Vicie Mutters, PA-C [facility] None    ----------------------------------------------------------------------------------------------------------------------  HPI 63 y.o. female  presents for 6 month follow up on hypertension, cholesterol, COPD, GERD, weight and vitamin D deficiency. She has COPD and is on Advair/albuterol.    Quit smoking 2 years in Nov, but  has a 18 pack year smoking history and had a syncopal episode in 2016, had normal EEG, holter but severe right carotid stenosis and is s/p right CEA with Dr. Donnetta Hutching. She is on bASA. She has hx of GI bleed/ulcers and remains on omeprazole 20 mg daily, GERD symptoms controlled without breakthrough.  She will take the integra plus when she feels like she needs it only.   She reports breathing is doing well on advair now that weather has cooled down; uses nebulizer rarely if needed. Does not refill of rescue inhaler.   BMI is Body mass index is 20.59 kg/m., she has been working on diet and exercise, reports appetite is excellent. She is on ensure daily.  Wt Readings from Last 3 Encounters:  06/04/19 112 lb 9.6 oz (51.1 kg)  12/27/18 111 lb 6.4 oz (50.5 kg)  09/18/18 108 lb (49 kg)   Her blood pressure has been controlled at home, today their BP is BP: 116/64  She does workout, walks daily. She denies chest pain, shortness of breath, dizziness.   She was on cholesterol medication Zetia 10 mg daily, statin stopped due to muscle aches, statin intolerant. Her cholesterol is not at goal. The cholesterol last visit was:   Lab Results  Component Value Date   CHOL 231 (H) 12/27/2018   HDL 47 (L) 12/27/2018   LDLCALC 154 (H) 12/27/2018   TRIG 163 (H) 12/27/2018   CHOLHDL 4.9 12/27/2018    She has been working on diet and exercise for glucose management, and denies foot ulcerations, increased appetite, nausea, paresthesia of the feet, polydipsia,  polyuria and visual disturbances. Last A1C in the office was:  Lab Results  Component Value Date   HGBA1C 4.8 06/23/2016   Patient is on Vitamin D supplement.   Lab Results  Component Value Date   VD25OH 66 12/27/2018       Current Medications:  Current Outpatient Medications on File Prior to Visit  Medication Sig  . albuterol (PROVENTIL) (2.5 MG/3ML) 0.083% nebulizer solution Take 3 mLs (2.5 mg total) by nebulization every 6 (six) hours as needed  for wheezing or shortness of breath.  Marland Kitchen aspirin EC 81 MG tablet Take 1 tablet (81 mg total) by mouth daily.  . Dietary Management Product (TOZAL PO) Take by mouth.  . ezetimibe (ZETIA) 10 MG tablet TAKE 1 TABLET BY MOUTH EVERY DAY  . FeFum-FePoly-FA-B Cmp-C-Biot (INTEGRA PLUS) CAPS Take 1 capsule by mouth daily.  . Fluticasone-Salmeterol (ADVAIR DISKUS) 250-50 MCG/DOSE AEPB Inhale 1 puff into the lungs every 12 (twelve) hours.  Marland Kitchen ipratropium (ATROVENT) 0.02 % nebulizer solution TAKE 2.5 MLS BY NEBULIZATION EVERY 4 HOURS AS NEEDED FOR WHEEZING OR SHORTNESS OF BREATH.  . IRON, FERROUS SULFATE, PO Take 65 mg by mouth 2 (two) times daily.  Marland Kitchen omeprazole (PRILOSEC) 20 MG capsule TAKE 1 CAPSULE BY MOUTH TWICE A DAY   No current facility-administered medications on file prior to visit.      Allergies:  Allergies  Allergen Reactions  . Celebrex [Celecoxib] Other (See Comments)    GI bleed  . Crestor [Rosuvastatin] Other (See Comments)    Made legs hurt  . Penicillins Other (See Comments)    Has patient had a PCN reaction causing immediate rash, facial/tongue/throat swelling, SOB or lightheadedness with hypotension: Yes Has patient had a PCN reaction causing severe rash involving mucus membranes or skin necrosis: No Has patient had a PCN reaction that required hospitalization: No Has patient had a PCN reaction occurring within the last 10 years: Yes If all of the above answers are "NO", then may proceed with Cephalosporin use.   REACTION: whelps  . Pravastatin Other (See Comments)    Muscle aches     Medical History:  Past Medical History:  Diagnosis Date  . Allergy   . Anemia   . Arthritis   . Asthma   . Blood transfusion without reported diagnosis   . Cataract   . COPD (chronic obstructive pulmonary disease) ([place])   . Duodenal ulcer   . Emphysema of lung ([place])   . Family history of malignant neoplasm of gastrointestinal tract   . GERD (gastroesophageal reflux disease)   .  Hepatitis    had in 6th grade pt unsure which kind  . Hiatal hernia   . HTN (hypertension) 10/03/2014  . Hx of colonic polyps   . Hyperlipemia   . Internal hemorrhoids   . Osteoporosis   . Peripheral vascular disease ([place])   . Pneumonia    hx of  . Shortness of breath dyspnea    with ambulation  . Stroke ([place])   . Vitamin D deficiency    Family history- Reviewed and unchanged Social history- Reviewed and unchanged   Review of Systems:  Review of Systems  Constitutional: Negative for malaise/fatigue and weight loss.  HENT: Negative for hearing loss and tinnitus.   Eyes: Negative for blurred vision and double vision.  Respiratory: Negative for cough, shortness of breath (with hot/humid weather only) and wheezing.   Cardiovascular: Negative for chest pain, palpitations, orthopnea, claudication and leg swelling.  Gastrointestinal: Negative for abdominal  pain, blood in stool, constipation, diarrhea, heartburn, melena, nausea and vomiting.  Genitourinary: Negative.   Musculoskeletal: Negative for joint pain and myalgias.  Skin: Negative for rash.  Neurological: Negative for dizziness, tingling, sensory change, weakness and headaches.  Endo/Heme/Allergies: Negative for polydipsia.  Psychiatric/Behavioral: Negative.   All other systems reviewed and are negative.   Physical Exam: BP 116/64   Pulse 97   Temp 98.1 F (36.7 C)   Wt 112 lb 9.6 oz (51.1 kg)   SpO2 98%   BMI 20.59 kg/m  Wt Readings from Last 3 Encounters:  06/04/19 112 lb 9.6 oz (51.1 kg)  12/27/18 111 lb 6.4 oz (50.5 kg)  09/18/18 108 lb (49 kg)   General Appearance: Thin older adult, appears older than stated age, in no apparent distress. Eyes: PERRLA, EOMs, conjunctiva no swelling or erythema Sinuses: No Frontal/maxillary tenderness ENT/Mouth: Ext aud canals clear, TMs without erythema, bulging. No erythema, swelling, or exudate on post pharynx.  Tonsils not swollen or erythematous. Hearing normal.  Neck:  Supple, thyroid normal.  Respiratory: Respiratory effort normal, BS equal bilaterally without rales, rhonchi, wheezing or stridor.  Cardio: RRR with no MRGs. Brisk peripheral pulses without edema.  Abdomen: Soft, + BS.  Non tender, no guarding, rebound, hernias, masses. Lymphatics: Non tender without lymphadenopathy.  Musculoskeletal: Full ROM, 5/5 strength, Normal gait Skin: Warm, dry without rashes, lesions, ecchymosis.  Neuro: Cranial nerves intact. No cerebellar symptoms.  Psych: Awake and oriented X 3, normal affect, Insight and Judgment appropriate.    Vicie Mutters, PA-C 12:53 PM [facility]

## 2019-06-04 ENCOUNTER — Ambulatory Visit: Payer: BC Managed Care – PPO | Admitting: Physician Assistant

## 2019-06-04 ENCOUNTER — Other Ambulatory Visit: Payer: Self-pay

## 2019-06-04 ENCOUNTER — Encounter: Payer: Self-pay | Admitting: Physician Assistant

## 2019-06-04 VITALS — BP 116/64 | HR 97 | Temp 98.1°F | Wt 112.6 lb

## 2019-06-04 DIAGNOSIS — E782 Mixed hyperlipidemia: Secondary | ICD-10-CM | POA: Diagnosis not present

## 2019-06-04 DIAGNOSIS — D508 Other iron deficiency anemias: Secondary | ICD-10-CM

## 2019-06-04 DIAGNOSIS — F325 Major depressive disorder, single episode, in full remission: Secondary | ICD-10-CM

## 2019-06-04 DIAGNOSIS — J449 Chronic obstructive pulmonary disease, unspecified: Secondary | ICD-10-CM

## 2019-06-04 DIAGNOSIS — I10 Essential (primary) hypertension: Secondary | ICD-10-CM

## 2019-06-04 DIAGNOSIS — E559 Vitamin D deficiency, unspecified: Secondary | ICD-10-CM | POA: Diagnosis not present

## 2019-06-04 DIAGNOSIS — E440 Moderate protein-calorie malnutrition: Secondary | ICD-10-CM

## 2019-06-04 DIAGNOSIS — Z79899 Other long term (current) drug therapy: Secondary | ICD-10-CM | POA: Diagnosis not present

## 2019-06-04 DIAGNOSIS — I700 Atherosclerosis of aorta: Secondary | ICD-10-CM | POA: Diagnosis not present

## 2019-06-04 DIAGNOSIS — J441 Chronic obstructive pulmonary disease with (acute) exacerbation: Secondary | ICD-10-CM

## 2019-06-04 MED ORDER — ALBUTEROL SULFATE HFA 108 (90 BASE) MCG/ACT IN AERS: INHALATION_SPRAY | RESPIRATORY_TRACT | 3 refills | Status: DC

## 2019-06-04 MED ORDER — NEXLIZET 180-10 MG PO TABS: 1.0000 | ORAL_TABLET | Freq: Every day | ORAL | 3 refills | Status: DC

## 2019-06-04 NOTE — Patient Instructions (Addendum)
Will give samples and send in prescription of nexletol with zetia in it  [place] or Citracel is good for constipation/diarrhea/irritable bowel syndrome, it helps with weight loss and can help lower your bad cholesterol. Please do 1 TBSP in the morning in water, coffee, or tea. It can take up to a month before you can see a difference with your bowel movements. It is cheapest from [employer], [employer], [employer].   Here is some information to help you keep your heart healthy: Move it! - Aim for 30 mins of activity every day. Take it slowly at first. Talk to [place] before starting any new exercise program.   Eat Right -focus on fruits, vegetables, whole grains, and meals you make yourself. Avoid foods with trans fat and high sugar/sodium content.   Snooze or Snore? - Loud snoring can be a sign of sleep apnea, a significant risk factor for high blood pressure, heart attach, stroke, and heart arrhythmias.  Kick the habit -Quit Smoking! Avoid second hand smoke. A single cigarette raises your blood pressure for 20 mins and increases the risk of heart attack and stroke for the next 24 hours.   Are Aspirin and Supplements right for you? -Add ENTERIC COATED low dose 81 mg Aspirin daily OR can do every other day if you have easy bruising to protect your heart and head. As well as to reduce risk of Colon Cancer by 20 %, Skin Cancer by 26 % , Melanoma by 46% and Pancreatic cancer by 60%  Say "No to Stress -There may be little you can do about problems that cause stress. However, techniques such as long walks, meditation, and exercise can help you manage it.   Start Now! - Make changes one at a time and set reasonable goals to increase your likelihood of success.

## 2019-06-05 LAB — VITAMIN D 25 HYDROXY (VIT D DEFICIENCY, FRACTURES): Vit D, 25-Hydroxy: 75 ng/mL (ref 30–100)

## 2019-06-05 LAB — CBC WITH DIFFERENTIAL/PLATELET
Absolute Monocytes: 429 cells/uL (ref 200–950)
Basophils Absolute: 52 cells/uL (ref 0–200)
Basophils Relative: 0.9 %
Eosinophils Absolute: 99 cells/uL (ref 15–500)
Eosinophils Relative: 1.7 %
HCT: 33.4 % — ABNORMAL LOW (ref 35.0–45.0)
Hemoglobin: 10.9 g/dL — ABNORMAL LOW (ref 11.7–15.5)
Lymphs Abs: 1305 cells/uL (ref 850–3900)
MCH: 27.3 pg (ref 27.0–33.0)
MCHC: 32.6 g/dL (ref 32.0–36.0)
MCV: 83.7 fL (ref 80.0–100.0)
MPV: 9.2 fL (ref 7.5–12.5)
Monocytes Relative: 7.4 %
Neutro Abs: 3915 cells/uL (ref 1500–7800)
Neutrophils Relative %: 67.5 %
Platelets: 346 10*3/uL (ref 140–400)
RBC: 3.99 10*6/uL (ref 3.80–5.10)
RDW: 16.1 % — ABNORMAL HIGH (ref 11.0–15.0)
Total Lymphocyte: 22.5 %
WBC: 5.8 10*3/uL (ref 3.8–10.8)

## 2019-06-05 LAB — COMPLETE METABOLIC PANEL WITH GFR
AG Ratio: 1.7 (calc) (ref 1.0–2.5)
ALT: 7 U/L (ref 6–29)
AST: 14 U/L (ref 10–35)
Albumin: 4.5 g/dL (ref 3.6–5.1)
Alkaline phosphatase (APISO): 60 U/L (ref 37–153)
BUN: 19 mg/dL (ref 7–25)
CO2: 22 mmol/L (ref 20–32)
Calcium: 9.7 mg/dL (ref 8.6–10.4)
Chloride: 110 mmol/L (ref 98–110)
Creat: 0.82 mg/dL (ref 0.50–0.99)
GFR, Est African American: 90 mL/min/{1.73_m2} (ref >=60)
GFR, Est Non African American: 77 mL/min/{1.73_m2} (ref >=60)
Globulin: 2.6 g/dL (calc) (ref 1.9–3.7)
Glucose, Bld: 92 mg/dL (ref 65–99)
Potassium: 5.1 mmol/L (ref 3.5–5.3)
Sodium: 143 mmol/L (ref 135–146)
Total Bilirubin: 0.1 mg/dL — ABNORMAL LOW (ref 0.2–1.2)
Total Protein: 7.1 g/dL (ref 6.1–8.1)

## 2019-06-05 LAB — LIPID PANEL
Cholesterol: 228 mg/dL — ABNORMAL HIGH (ref <200)
HDL: 50 mg/dL (ref >=50)
LDL Cholesterol (Calc): 148 mg/dL (calc) — ABNORMAL HIGH
Non-HDL Cholesterol (Calc): 178 mg/dL (calc) — ABNORMAL HIGH (ref <130)
Total CHOL/HDL Ratio: 4.6 (calc) (ref <5.0)
Triglycerides: 166 mg/dL — ABNORMAL HIGH (ref <150)

## 2019-06-05 LAB — VITAMIN B12: Vitamin B-12: 616 pg/mL (ref 200–1100)

## 2019-06-05 LAB — MAGNESIUM: Magnesium: 2 mg/dL (ref 1.5–2.5)

## 2019-06-05 LAB — IRON, TOTAL/TOTAL IRON BINDING CAP
%SAT: 7 % (calc) — ABNORMAL LOW (ref 16–45)
Iron: 33 ug/dL — ABNORMAL LOW (ref 45–160)
TIBC: 491 mcg/dL (calc) — ABNORMAL HIGH (ref 250–450)

## 2019-06-05 LAB — TSH: TSH: 1.62 mIU/L (ref 0.40–4.50)

## 2019-07-15 ENCOUNTER — Other Ambulatory Visit: Payer: Self-pay | Admitting: Physician Assistant

## 2019-09-21 NOTE — Progress Notes (Deleted)
Complete Physical  Assessment and Plan: Essential hypertension - continue medications, DASH diet, exercise and monitor at home. Call if greater than 130/80.  - CBC with Differential/Platelet - BASIC METABOLIC PANEL WITH GFR - Hepatic function panel - TSH - Microalbumin / creatinine urine ratio - Urine Microscopic  Mixed hyperlipidemia -continue medications, check lipids, decrease fatty foods, increase activity.  - Lipid panel   Severe carotid stenosis, asymptomatic, right s/p right carotid endarterectomy and doing very well without   Chronic obstructive pulmonary disease, unspecified COPD, unspecified chronic bronchitis type Stopped smoking continue Adviar/albuterol  Asthma, with COPD Continue breo  COLONIC POLYPS Up to date  Gastroesophageal reflux disease, esophagitis presence not specified Continue PPI/H2 blocker, diet discussed  Upper GI bleed Avoid NSAIDS, has quit smoking, continue medications  Vitamin D deficiency - Vit D  25 hydroxy (rtn osteoporosis monitoring)  Medication management - Magnesium  Thoracic aorta atherosclerosis Control blood pressure, cholesterol, glucose, increase exercise.   Osteoporosis - DEXA off fosamax, get this year WITH MGM  Depression, major, in remission Monitor, no symptoms at this time  Encounter for general adult medical examination with abnormal findings Get MGM will try to schedule for her   Mild malnutrition ([place]) Continue ensure  Iron deficiency anemia secondary to inadequate dietary iron intake -     Iron,Total/Total Iron Binding Cap -     Vitamin B12  Medication management -     Magnesium  Adult BMI <19 kg/sq m Continue ensure   Discussed med's effects and SE's. Screening labs and tests as requested with regular follow-up as recommended. Future Appointments  Date Time Provider [place]  09/25/2019 10:00 AM Vicie Mutters, PA-C [facility] None  09/25/2020 10:00 AM Vicie Mutters, PA-C [facility]  None    HPI  63 y.o. female  presents for a complete physical.  Her blood pressure has been controlled at home, today their BP is     Legally blind in right eye, on Tozal, she is following with Dr. Eddie Dibbles. She is unable to get to the [facility] imaging due to her not being able to drive for this right eye.   She has COPD and is on Advair/albuterol.  Quit smoking over a year ago.    She does not workout. She denies chest pain, shortness of breath, dizziness.   s/p right CEA due to syncopal episode in 2016 with Dr. Donnetta Hutching. She is on bASA.   She had DEXA 09/2015, was on fosamax. Has history of cervical compression fractures.  She is on cholesterol medication, ZETIA EVERY OTHER DAY but has intolerance of statins.  Her cholesterol is not at goal of less than 70. The cholesterol last visit was:   Lab Results  Component Value Date   CHOL 228 (H) 06/04/2019   HDL 50 06/04/2019   LDLCALC 148 (H) 06/04/2019   TRIG 166 (H) 06/04/2019   CHOLHDL 4.6 06/04/2019  Last A1C in the office was:  Lab Results  Component Value Date   HGBA1C 4.8 06/23/2016  Patient is on Vitamin D supplement.   Lab Results  Component Value Date   VD25OH 12 06/04/2019  Last EGD 2015 with Dr. Henrene Pastor that showed gastric ulcers, is on PPI BID.   BMI is There is no height or weight on file to calculate BMI., she is drinking ensure/mild and trying to gain weight.  Wt Readings from Last 3 Encounters:  06/04/19 112 lb 9.6 oz (51.1 kg)  12/27/18 111 lb 6.4 oz (50.5 kg)  09/18/18 108 lb (49  kg)    Current Medications:  Current Outpatient Medications on File Prior to Visit  Medication Sig Dispense Refill  . albuterol (PROVENTIL) (2.5 MG/3ML) 0.083% nebulizer solution Take 3 mLs (2.5 mg total) by nebulization every 6 (six) hours as needed for wheezing or shortness of breath. 75 mL 12  . albuterol (VENTOLIN HFA) 108 (90 Base) MCG/ACT inhaler 2 Inhalations 15 to 20  minutes apart every 4 hours to Rescue Asthma 18 g 3  . aspirin EC  81 MG tablet Take 1 tablet (81 mg total) by mouth daily. 30 tablet 0  . Bempedoic Acid-Ezetimibe (NEXLIZET) 180-10 MG TABS Take 1 tablet by mouth daily. 90 tablet 3  . Dietary Management Product (TOZAL PO) Take by mouth.    . ezetimibe (ZETIA) 10 MG tablet TAKE 1 TABLET BY MOUTH EVERY DAY 90 tablet 1  . FeFum-FePoly-FA-B Cmp-C-Biot (INTEGRA PLUS) CAPS Take 1 capsule by mouth daily. 30 capsule 4  . Fluticasone-Salmeterol (ADVAIR DISKUS) 250-50 MCG/DOSE AEPB Inhale 1 puff into the lungs every 12 (twelve) hours.    Marland Kitchen ipratropium (ATROVENT) 0.02 % nebulizer solution TAKE 2.5 MLS BY NEBULIZATION EVERY 4 HOURS AS NEEDED FOR WHEEZING OR SHORTNESS OF BREATH. 250 mL 2  . IRON, FERROUS SULFATE, PO Take 65 mg by mouth 2 (two) times daily.    Marland Kitchen omeprazole (PRILOSEC) 20 MG capsule Take 1 capsule 2 x /day for Indigestion & [place] 180 capsule 1   No current facility-administered medications on file prior to visit.    Health Maintenance:   Immunization History  Administered Date(s) Administered  . Pneumococcal Polysaccharide-23 12/18/2014  . Pneumococcal-Unspecified 05/18/2001  . Td 12/16/2004  . Tdap 01/02/2015   Tetanus: 2016 Pneumovax: 2016 Prevnar 13: due when 67 Flu vaccine: declines Zostavax: N/A  LMP: s/p hysterectomy Pap: Dr. Harrington Challenger MGM: 12/2012  OVER DUE DEXA: 09/2015 osteoporisis , on fosamax x 2014 for 4 years- needs REPEAT Colonoscopy: 12/2015 EGD: 09/2014 CT chest 11/2014 CXR 11/2014 MRA brain- 08/19/2016 Myocardial perfusion- 2009 Echo: 2017 Carotid US 11/2014 AB [place] 08/2014  Last Dental Exam: None, [facility] Last Eye Exam: Dr. Augusto Gamble  Patient Care Team: Unk Pinto, MD as PCP - General (Internal Medicine) Irene Shipper, MD as Consulting Physician (Gastroenterology) Gus Height, MD (Inactive) as Consulting Physician (Obstetrics and Gynecology)  Dr. Donnetta Hutching  Allergies:  Allergies  Allergen Reactions  . Celebrex [Celecoxib] Other (See Comments)    GI bleed   . Crestor [Rosuvastatin] Other (See Comments)    Made legs hurt  . Penicillins Other (See Comments)    Has patient had a PCN reaction causing immediate rash, facial/tongue/throat swelling, SOB or lightheadedness with hypotension: Yes Has patient had a PCN reaction causing severe rash involving mucus membranes or skin necrosis: No Has patient had a PCN reaction that required hospitalization: No Has patient had a PCN reaction occurring within the last 10 years: Yes If all of the above answers are "NO", then may proceed with Cephalosporin use.   REACTION: whelps  . Pravastatin Other (See Comments)    Muscle aches   Medical History:  Past Medical History:  Diagnosis Date  . Allergy   . Anemia   . Arthritis   . Asthma   . Blood transfusion without reported diagnosis   . Cataract   . COPD (chronic obstructive pulmonary disease) ([place])   . Duodenal ulcer   . Emphysema of lung ([place])   . Family history of malignant neoplasm of gastrointestinal tract   . GERD (gastroesophageal reflux  disease)   . Hepatitis    had in 6th grade pt unsure which kind  . Hiatal hernia   . HTN (hypertension) 10/03/2014  . Hx of colonic polyps   . Hyperlipemia   . Internal hemorrhoids   . Osteoporosis   . Peripheral vascular disease ([place])   . Pneumonia    hx of  . Shortness of breath dyspnea    with ambulation  . Stroke ([place])   . Vitamin D deficiency    SURGICAL HISTORY She  has a past surgical history that includes Abdominal hysterectomy; Appendectomy; Total hip arthroplasty (Right); Lumbar disc surgery; Elbow surgery (Right); Wrist surgery (Left); Hand surgery (Left); Esophagogastroduodenoscopy (10/2008); Colonoscopy; Upper gastrointestinal endoscopy; trigeminal neuralgia surgery; Endarterectomy (Right, 12/16/2014); and Cataract extraction, bilateral. FAMILY HISTORY Her family history includes AAA (abdominal aortic aneurysm) in her brother and mother; Colon cancer in her unknown relative; Colon polyps  in her mother; Diabetes in her maternal grandfather; Heart attack in her brother, father, and mother; Heart disease in her brother, father, maternal grandfather, maternal grandmother, and mother; Hyperlipidemia in her brother, father, and mother; Hypertension in her brother, father, and mother; Inflammatory bowel disease in her mother; Stroke in her mother; Varicose Veins in her daughter and mother. SOCIAL HISTORY She  reports that she quit smoking about 2 years ago. Her smoking use included cigarettes. She has a 9.00 pack-year smoking history. She has never used smokeless tobacco. She reports current alcohol use. She reports that she does not use drugs.  Review of Systems: Review of Systems  Constitutional: Negative for chills, diaphoresis, fever, malaise/fatigue and weight loss.  HENT: Positive for hearing loss. Negative for congestion, ear discharge, ear pain, nosebleeds, sinus pain, sore throat and tinnitus.   Eyes: Negative.  Negative for blurred vision and double vision.  Respiratory: Negative for cough, hemoptysis, sputum production, shortness of breath, wheezing and stridor.   Cardiovascular: Negative for chest pain, palpitations, orthopnea, claudication, leg swelling and PND.  Gastrointestinal: Negative.  Negative for abdominal pain, heartburn and nausea.  Genitourinary: Negative.   Musculoskeletal: Negative for back pain, falls, joint pain, myalgias and neck pain.  Skin: Negative.  Negative for itching and rash.  Neurological: Negative for dizziness, tingling, tremors, sensory change, speech change, focal weakness, seizures, loss of consciousness, weakness and headaches.  Psychiatric/Behavioral: Negative.     Physical Exam: Estimated body mass index is 20.59 kg/m as calculated from the following:   Height as of 12/27/18: 5\' 2"  (1.575 m).   Weight as of 06/04/19: 112 lb 9.6 oz (51.1 kg). There were no vitals taken for this visit. General Appearance: Well nourished, in no apparent  distress.  Eyes: PERRLA, EOMs, conjunctiva no swelling or erythema, normal fundi and vessels.  Sinuses: No Frontal/maxillary tenderness  ENT/Mouth: Ext aud canals clear, normal light reflex with TMs without erythema, bulging. Good dentition. No erythema, swelling, or exudate on post pharynx. Tonsils not swollen or erythematous. Hearing normal.  Neck: Supple, thyroid normal. No bruits  Respiratory: Respiratory effort normal, diffuse decreased breath sounds with rhonchi/wheezing, improving after breathing treatment.   Cardio: RRR without murmurs, rubs or gallops. Brisk peripheral pulses without edema.  Chest: symmetric, with normal excursions and percussion.  Breasts: declines.  Abdomen: Soft, nontender, no guarding, rebound, hernias, masses, or organomegaly. .  Lymphatics: Non tender without lymphadenopathy.  Genitourinary: declines Musculoskeletal: Full ROM all peripheral extremities,5/5 strength, and normal gait.  Skin: Well healing carotid enterectomy scar. Warm, dry without rashes, lesions, ecchymosis. Neuro: Cranial nerves intact, reflexes equal bilaterally.  Normal muscle tone, no cerebellar symptoms.Sensation intact.  Psych: Awake and oriented X 3, normal affect, Insight and Judgment appropriate.   EKG: WNL no changes AORTA SCAN: declines  Vicie Mutters 1:15 PM [facility]

## 2019-09-25 ENCOUNTER — Encounter: Payer: Self-pay | Admitting: Physician Assistant

## 2019-09-27 DIAGNOSIS — Z471 Aftercare following joint replacement surgery: Secondary | ICD-10-CM | POA: Diagnosis not present

## 2019-09-27 DIAGNOSIS — Z96649 Presence of unspecified artificial hip joint: Secondary | ICD-10-CM | POA: Insufficient documentation

## 2019-09-27 DIAGNOSIS — Z96641 Presence of right artificial hip joint: Secondary | ICD-10-CM | POA: Diagnosis not present

## 2019-10-10 ENCOUNTER — Telehealth: Payer: Self-pay

## 2019-10-10 ENCOUNTER — Other Ambulatory Visit: Payer: Self-pay | Admitting: Physician Assistant

## 2019-10-10 MED ORDER — DEXAMETHASONE 0.5 MG PO TABS: ORAL_TABLET | ORAL | 0 refills | Status: DC

## 2019-10-10 MED ORDER — AZITHROMYCIN 250 MG PO TABS: ORAL_TABLET | ORAL | 1 refills | Status: AC

## 2019-10-10 NOTE — Telephone Encounter (Signed)
-----   Message from Vicie Mutters, [place] sent at 10/10/2019  4:49 PM EST ----- Regarding: RE: med request Needs to get tested for COVID too.  Go to the ER if any chest pain, shortness of breath, nausea, dizziness, severe HA, changes vision/speech ----- Message ----- From: Elenor Quinones, CMA Sent: 10/10/2019   1:54 PM EST To: Vicie Mutters, PA-C Subject: med request                                    Per office note: patient's daughter Larene Beach would like to know if you will call her in a [place].   Sxs:   head cold  Stuffy nose  Cough--prod--brown/yellow color  Started on Sunday.  Mother can not come in today & shannon does not want her mother to get worse. Daughter states that she will call & schedule an appt on Monday if the ABX does not work.   Please & thank you

## 2019-10-10 NOTE — Telephone Encounter (Signed)
The patient has been notified of this information and all questions answered.

## 2019-10-22 NOTE — Progress Notes (Signed)
Complete Physical  Assessment and Plan:  Encounter for general adult medical examination with abnormal findings 1 year GET MGM AND DEXA Get CXR  COPD exacerbation ([place]) -     DG Chest 2 View; Future Treated with decadron and zpak- improved Continue advair and albuterol as needed  Abnormal pulse No claudication but with CEA history and decreased pulses will get an ABI in the future, patient wants to wait due to pandemic  Mixed hyperlipidemia -     Lipid Profile - NOT AT GOAL- WILL TRY NEXLETOL/ZETIA- IF STILL NOT AT GOAL LESS THAN 70 WILL REFER TO LIPID CLINIC FOR POSSIBLE PSK9  Other chest pain -     EKG 12-Lead -     Ambulatory referral to Cardiology - No EKG changes - ? From decadron which patient stopped- will avoid in the future- HOWEVER, patient very high risk with history of PAD, and uncontrolled lipids, has stopped smoking- has been having pain between her shoulder blades with activity PRIOR to COPD exacerbation, will refer to cardiology for evaluation.  Go to the ER if any chest pain, shortness of breath, nausea, dizziness, severe HA, changes vision/speech  Chronic obstructive pulmonary disease, unspecified COPD type (HCC) No triggers, well controlled symptoms, cont to monitor  Essential hypertension - continue medications, DASH diet, exercise and monitor at home. Call if greater than 130/80.  -     CBC with Diff -     COMPLETE METABOLIC PANEL WITH GFR -     TSH -     Urinalysis, Routine w reflex microscopic -     Microalbumin / Creatinine Urine Ratio  Depression, major, in remission ([place]) - continue medications, stress management techniques discussed, increase water, good sleep hygiene discussed, increase exercise, and increase veggies.   Protein-calorie malnutrition, moderate (HCC) CONTINUE ENSURE  Iron deficiency anemia secondary to inadequate dietary iron intake -     Iron,Total/Total Iron Binding Cap -     Ferritin -     Vitamin B12  Vitamin D  deficiency -     Vitamin D (25 hydroxy)  Asymptomatic stenosis of right carotid artery S/P CEA  Asthma-COPD overlap syndrome ([place]) CONTINUE ADVAIR, SHE IS NOT SMOKING  Thoracic aorta atherosclerosis (HCC) Control blood pressure, cholesterol, glucose, increase exercise.   Statin intolerance WILL TRY NEXLETOL/ZETIA- IF STILL NOT AT GOAL LESS THAN 70 WILL REFER TO LIPID CLINIC FOR POSSIBLE PSK9  TIA (transient ischemic attack) Control blood pressure, cholesterol, glucose, increase exercise.   Medication management -     Magnesium   Discussed med's effects and SE's. Screening labs and tests as requested with regular follow-up as recommended. Future Appointments  Date Time Provider [place]  10/23/2020  9:00 AM Vicie Mutters, PA-C [id number] None    HPI  64 y.o. female  presents for a complete physical.  Her blood pressure has been controlled at home, today their BP is BP: 118/76   She had recent COPD exacerbation, given zpak and decadron, states after day 4 of the decadron she had nausea and epigastric pain. She states worse with lying down, she had shooting chest pain across her chest last monday, lasted 15 mins and eased up after water and walking around some and with beltching. Would go to sleep and wake her up 2 hours later. She had palpitations, some SOB with lying down, some pain into her left arm, no other accompaniments.  She also has pain between her shoulder blades, worse with activity and picking things up, had that with  the CP as well. She has history of GI bleed, states some of the pain got better after stopping the decadron, she is still on prilosec 20 mg BID. She has not black stool or blood in stool.   EGD 2017- showed gastric ulcers, is on PPI BID.  last CXR 08/2018 Echo 2018, s/p right CEA, last carotid US 2018, CTA chest 08/2016.  Legally blind in right eye, on Tozal, she is following with Dr. Eddie Dibbles.   She has COPD and is on Advair/albuterol.  Quit  smoking 2 years in Nov.   She does not workout. She denies chest pain, shortness of breath, dizziness.   s/p right CEA due to syncopal episode in 2016 with Dr. Donnetta Hutching. She is on bASA.   She had DEXA 09/2015. Has history of cervical compression fractures. She has had the MGM and DEXA rescheduled due to the pandemic. Off fosamax due to GI bleed/GERD.  She is on cholesterol medication, ZETIA EVERY OTHER DAY but has intolerance of statins, NEVER STARTED THE NEXLIZET GIVEN.  Her cholesterol is not at goal of less than 70. The cholesterol last visit was:   Lab Results  Component Value Date   CHOL 228 (H) 06/04/2019   HDL 50 06/04/2019   LDLCALC 148 (H) 06/04/2019   TRIG 166 (H) 06/04/2019   CHOLHDL 4.6 06/04/2019   Last A1C in the office was:  Lab Results  Component Value Date   HGBA1C 4.8 06/23/2016  Patient is on Vitamin D supplement.   Lab Results  Component Value Date   VD25OH 75 06/04/2019   BMI is Body mass index is 22.68 kg/m., she is drinking ensure/mild and trying to gain weight.  Wt Readings from Last 3 Encounters:  10/24/19 124 lb (56.2 kg)  06/04/19 112 lb 9.6 oz (51.1 kg)  12/27/18 111 lb 6.4 oz (50.5 kg)    Current Medications:  Current Outpatient Medications on File Prior to Visit  Medication Sig Dispense Refill  . albuterol (PROVENTIL) (2.5 MG/3ML) 0.083% nebulizer solution Take 3 mLs (2.5 mg total) by nebulization every 6 (six) hours as needed for wheezing or shortness of breath. 75 mL 12  . albuterol (VENTOLIN HFA) 108 (90 Base) MCG/ACT inhaler 2 Inhalations 15 to 20  minutes apart every 4 hours to Rescue Asthma 18 g 3  . aspirin EC 81 MG tablet Take 1 tablet (81 mg total) by mouth daily. 30 tablet 0  . Dietary Management Product (TOZAL PO) Take by mouth.    . ezetimibe (ZETIA) 10 MG tablet TAKE 1 TABLET BY MOUTH EVERY DAY 90 tablet 1  . Fluticasone-Salmeterol (ADVAIR DISKUS) 250-50 MCG/DOSE AEPB Inhale 1 puff into the lungs every 12 (twelve) hours.    Marland Kitchen  ipratropium (ATROVENT) 0.02 % nebulizer solution TAKE 2.5 MLS BY NEBULIZATION EVERY 4 HOURS AS NEEDED FOR WHEEZING OR SHORTNESS OF BREATH. 250 mL 2  . IRON, FERROUS SULFATE, PO Take 65 mg by mouth 2 (two) times daily.    Marland Kitchen omeprazole (PRILOSEC) 20 MG capsule Take 1 capsule 2 x /day for Indigestion & [place] 180 capsule 1   No current facility-administered medications on file prior to visit.   Health Maintenance:   Immunization History  Administered Date(s) Administered  . Pneumococcal Polysaccharide-23 12/18/2014  . Pneumococcal-Unspecified 05/18/2001  . Td 12/16/2004  . Tdap 01/02/2015   Tetanus: 2016 Pneumovax: 2016 Prevnar 13: due when 67 Flu vaccine: declines Zostavax: N/A  LMP: s/p hysterectomy Pap: Dr. Harrington Challenger MGM: 12/2012  OVER DUE DEXA: 09/2015 osteoporisis ,  on fosamax x 2014 for 4 years- needs REPEAT- overdue Colonoscopy: 12/2015 EGD: 2017 CT chest 11/2014 CXR 2019 MRA brain- 08/19/2016 Myocardial perfusion- 2009 Echo: 2017 Carotid US 11/2014 AB [place] 08/2014  Last Dental Exam: None, [facility] Last Eye Exam: Dr. Augusto Gamble  Patient Care Team: Unk Pinto, MD as PCP - General (Internal Medicine) Irene Shipper, MD as Consulting Physician (Gastroenterology) Gus Height, MD (Inactive) as Consulting Physician (Obstetrics and Gynecology)  Dr. Donnetta Hutching  Allergies:  Allergies  Allergen Reactions  . Celebrex [Celecoxib] Other (See Comments)    GI bleed  . Crestor [Rosuvastatin] Other (See Comments)    Made legs hurt  . Penicillins Other (See Comments)    Has patient had a PCN reaction causing immediate rash, facial/tongue/throat swelling, SOB or lightheadedness with hypotension: Yes Has patient had a PCN reaction causing severe rash involving mucus membranes or skin necrosis: No Has patient had a PCN reaction that required hospitalization: No Has patient had a PCN reaction occurring within the last 10 years: Yes If all of the above answers are "NO", then may  proceed with Cephalosporin use.   REACTION: whelps  . Pravastatin Other (See Comments)    Muscle aches   Medical History:  Past Medical History:  Diagnosis Date  . Allergy   . Anemia   . Arthritis   . Asthma   . Blood transfusion without reported diagnosis   . Cataract   . COPD (chronic obstructive pulmonary disease) ([place])   . Duodenal ulcer   . Emphysema of lung ([place])   . Family history of malignant neoplasm of gastrointestinal tract   . GERD (gastroesophageal reflux disease)   . Hepatitis    had in 6th grade pt unsure which kind  . Hiatal hernia   . HTN (hypertension) 10/03/2014  . Hx of colonic polyps   . Hyperlipemia   . Internal hemorrhoids   . Osteoporosis   . Peripheral vascular disease ([place])   . Pneumonia    hx of  . Shortness of breath dyspnea    with ambulation  . Stroke ([place])   . Vitamin D deficiency    SURGICAL HISTORY She  has a past surgical history that includes Abdominal hysterectomy; Appendectomy; Total hip arthroplasty (Right); Lumbar disc surgery; Elbow surgery (Right); Wrist surgery (Left); Hand surgery (Left); Esophagogastroduodenoscopy (10/2008); Colonoscopy; Upper gastrointestinal endoscopy; trigeminal neuralgia surgery; Endarterectomy (Right, 12/16/2014); and Cataract extraction, bilateral. FAMILY HISTORY Her family history includes AAA (abdominal aortic aneurysm) in her brother and mother; Colon cancer in her unknown relative; Colon polyps in her mother; Diabetes in her maternal grandfather; Heart attack in her brother, father, and mother; Heart disease in her brother, father, maternal grandfather, maternal grandmother, and mother; Hyperlipidemia in her brother, father, and mother; Hypertension in her brother, father, and mother; Inflammatory bowel disease in her mother; Stroke in her mother; Varicose Veins in her daughter and mother. SOCIAL HISTORY She  reports that she quit smoking about 2 years ago. Her smoking use included cigarettes. She has a  9.00 pack-year smoking history. She has never used smokeless tobacco. She reports current alcohol use. She reports that she does not use drugs.  Review of Systems: Review of Systems  Constitutional: Negative for chills, diaphoresis, fever, malaise/fatigue and weight loss.  HENT: Positive for hearing loss. Negative for congestion, ear discharge, ear pain, nosebleeds, sinus pain, sore throat and tinnitus.   Eyes: Positive for blurred vision (DOES NOT DRIVE, GOING TO SEE RETINAL SPECIALIST DR. Eddie Dibbles). Negative for double vision.  Respiratory: Positive for cough, sputum production and shortness of breath. Negative for hemoptysis, wheezing and stridor.   Cardiovascular: Positive for chest pain and palpitations. Negative for orthopnea, claudication, leg swelling and PND.  Gastrointestinal: Negative.  Negative for abdominal pain, heartburn and nausea.  Genitourinary: Negative.   Musculoskeletal: Negative for back pain, falls, joint pain, myalgias and neck pain.  Skin: Negative.  Negative for itching and rash.  Neurological: Negative for dizziness, tingling, tremors, sensory change, speech change, focal weakness, seizures, loss of consciousness, weakness and headaches.  Psychiatric/Behavioral: Negative.     Physical Exam: Estimated body mass index is 22.68 kg/m as calculated from the following:   Height as of 12/27/18: 5\' 2"  (1.575 m).   Weight as of this encounter: 124 lb (56.2 kg). BP 118/76   Pulse 74   Temp (!) 97.5 F (36.4 C)   Wt 124 lb (56.2 kg)   SpO2 94%   BMI 22.68 kg/m  General Appearance: Thin appearing, in no apparent distress.  Eyes: PERRLA, EOMs, conjunctiva no swelling or erythema Sinuses: No Frontal/maxillary tenderness  ENT/Mouth: Ext aud canals clear, normal light reflex with TMs without erythema, bulging.  No erythema, swelling, or exudate on post pharynx. Tonsils not swollen or erythematous. Hearing normal.  Neck: Supple, thyroid normal. No bruits  Respiratory:  Respiratory effort normal, diffuse decreased breath sounds without rhonchi/wheezing   Cardio: RRR without murmurs, rubs or gallops. 1+ TP/DP pulses bilaterally without edema.  Chest: symmetric, with normal excursions and percussion.  Breasts: declines.  Abdomen: Soft, nontender, no guarding, rebound, hernias, masses, or organomegaly. .  Lymphatics: Non tender without lymphadenopathy.  Genitourinary: declines Musculoskeletal: Full ROM all peripheral extremities,5/5 strength, and normal gait.  Skin:Warm, dry without rashes, lesions, ecchymosis. Neuro: Cranial nerves intact, reflexes equal bilaterally. Normal muscle tone, no cerebellar symptoms.Sensation intact.  Psych: Awake and oriented X 3, normal affect, Insight and Judgment appropriate.   EKG: WNL no ST changes, PRWP AORTA SCAN: declines  Vicie Mutters 9:13 AM [facility]

## 2019-10-24 ENCOUNTER — Telehealth: Payer: Self-pay | Admitting: Cardiovascular Disease

## 2019-10-24 ENCOUNTER — Ambulatory Visit (INDEPENDENT_AMBULATORY_CARE_PROVIDER_SITE_OTHER): Payer: BC Managed Care – PPO | Admitting: Physician Assistant

## 2019-10-24 ENCOUNTER — Other Ambulatory Visit: Payer: Self-pay

## 2019-10-24 ENCOUNTER — Encounter: Payer: Self-pay | Admitting: Physician Assistant

## 2019-10-24 VITALS — BP 118/76 | HR 74 | Temp 97.5°F | Wt 124.0 lb

## 2019-10-24 DIAGNOSIS — Z1389 Encounter for screening for other disorder: Secondary | ICD-10-CM | POA: Diagnosis not present

## 2019-10-24 DIAGNOSIS — Z1329 Encounter for screening for other suspected endocrine disorder: Secondary | ICD-10-CM | POA: Diagnosis not present

## 2019-10-24 DIAGNOSIS — Z79899 Other long term (current) drug therapy: Secondary | ICD-10-CM

## 2019-10-24 DIAGNOSIS — Z0000 Encounter for general adult medical examination without abnormal findings: Secondary | ICD-10-CM | POA: Diagnosis not present

## 2019-10-24 DIAGNOSIS — Z130 Encounter for screening for diseases of the blood and blood-forming organs and certain disorders involving the immune mechanism: Secondary | ICD-10-CM

## 2019-10-24 DIAGNOSIS — R0789 Other chest pain: Secondary | ICD-10-CM

## 2019-10-24 DIAGNOSIS — Z789 Other specified health status: Secondary | ICD-10-CM

## 2019-10-24 DIAGNOSIS — E782 Mixed hyperlipidemia: Secondary | ICD-10-CM

## 2019-10-24 DIAGNOSIS — I700 Atherosclerosis of aorta: Secondary | ICD-10-CM

## 2019-10-24 DIAGNOSIS — I10 Essential (primary) hypertension: Secondary | ICD-10-CM

## 2019-10-24 DIAGNOSIS — G459 Transient cerebral ischemic attack, unspecified: Secondary | ICD-10-CM

## 2019-10-24 DIAGNOSIS — R0989 Other specified symptoms and signs involving the circulatory and respiratory systems: Secondary | ICD-10-CM

## 2019-10-24 DIAGNOSIS — Z13220 Encounter for screening for lipoid disorders: Secondary | ICD-10-CM

## 2019-10-24 DIAGNOSIS — E440 Moderate protein-calorie malnutrition: Secondary | ICD-10-CM

## 2019-10-24 DIAGNOSIS — J4489 Other specified chronic obstructive pulmonary disease: Secondary | ICD-10-CM

## 2019-10-24 DIAGNOSIS — I6521 Occlusion and stenosis of right carotid artery: Secondary | ICD-10-CM

## 2019-10-24 DIAGNOSIS — J441 Chronic obstructive pulmonary disease with (acute) exacerbation: Secondary | ICD-10-CM

## 2019-10-24 DIAGNOSIS — J449 Chronic obstructive pulmonary disease, unspecified: Secondary | ICD-10-CM

## 2019-10-24 DIAGNOSIS — F325 Major depressive disorder, single episode, in full remission: Secondary | ICD-10-CM

## 2019-10-24 DIAGNOSIS — E559 Vitamin D deficiency, unspecified: Secondary | ICD-10-CM | POA: Diagnosis not present

## 2019-10-24 DIAGNOSIS — D508 Other iron deficiency anemias: Secondary | ICD-10-CM

## 2019-10-24 DIAGNOSIS — Z0001 Encounter for general adult medical examination with abnormal findings: Secondary | ICD-10-CM

## 2019-10-24 NOTE — Telephone Encounter (Signed)
  Daughter states she will need to come with her mother to her appt due to issues with her sight.  She is the POA and will need to fill out her paperwork for her. Patient has appt with Dr Gwenlyn Found on 11/13/19 @ 11:00 am

## 2019-10-24 NOTE — Patient Instructions (Addendum)
HOW TO SCHEDULE A MAMMOGRAM  [facility]  7 a.m.-6:30 p.m., Monday 7 a.m.-5 p.m., Tuesday-Friday Schedule an appointment by calling [phone].  SCHEDULE WITH DEXA   Will refer to cardiology Go to the ER if any chest pain, shortness of breath, nausea, dizziness, severe HA, changes vision/speech  Start on the nexlitol, the zetia combo   Nonspecific Chest Pain, Adult Chest pain can be caused by many different conditions. It can be caused by a condition that is life-threatening and requires treatment right away. It can also be caused by something that is not life-threatening. If you have chest pain, it can be hard to know the difference, so it is important to get help right away to make sure that you do not have a serious condition. Some life-threatening causes of chest pain include:  Heart attack.  A tear in the body's main blood vessel (aortic dissection).  Inflammation around your heart (pericarditis).  A problem in the lungs, such as a blood clot (pulmonary embolism) or a collapsed lung (pneumothorax). Some non life-threatening causes of chest pain include:  Heartburn.  Anxiety or stress.  Damage to the bones, muscles, and cartilage that make up your chest wall.  Pneumonia or bronchitis.  Shingles infection (varicella-zoster virus). Chest pain can feel like:  Pain or discomfort on the surface of your chest or deep in your chest.  Crushing, pressure, aching, or squeezing pain.  Burning or tingling.  Dull or sharp pain that is worse when you move, cough, or take a deep breath.  Pain or discomfort that is also felt in your back, neck, jaw, shoulder, or arm, or pain that spreads to any of these areas. Your chest pain may come and go. It may also be constant. Your health care provider will do lab tests and other studies to find the cause of your pain. Treatment will depend on the cause of your chest pain. Follow these instructions at  home: Medicines  Take over-the-counter and prescription medicines only as told by your health care provider.  If you were prescribed an antibiotic, take it as told by your health care provider. Do not stop taking the antibiotic even if you start to feel better. Lifestyle   Rest as directed by your health care provider.  Do not use any products that contain nicotine or tobacco, such as cigarettes and e-cigarettes. If you need help quitting, ask your health care provider.  Do not drink alcohol.  Make healthy lifestyle choices as recommended. These may include: ? Getting regular exercise. Ask your health care provider to suggest some activities that are safe for you. ? Eating a heart-healthy diet. This includes plenty of fresh fruits and vegetables, whole grains, low-fat (lean) protein, and low-fat dairy products. A dietitian can help you find healthy eating options. ? Maintaining a healthy weight. ? Managing any other health conditions you have, such as high blood pressure (hypertension) or diabetes. ? Reducing stress, such as with yoga or relaxation techniques. General instructions  Pay attention to any changes in your symptoms. Tell your health care provider about them or any new symptoms.  Avoid any activities that cause chest pain.  Keep all follow-up visits as told by your health care provider. This is important. This includes visits for any further testing if your chest pain does not go away. Contact a health care provider if:  Your chest pain does not go away.  You feel depressed.  You have a fever. Get help right  away if:  Your chest pain gets worse.  You have a cough that gets worse, or you cough up blood.  You have severe pain in your abdomen.  You faint.  You have sudden, unexplained chest discomfort.  You have sudden, unexplained discomfort in your arms, back, neck, or jaw.  You have shortness of breath at any time.  You suddenly start to sweat, or your  skin gets clammy.  You feel nausea or you vomit.  You suddenly feel lightheaded or dizzy.  You have severe weakness, or unexplained weakness or fatigue.  Your heart begins to beat quickly, or it feels like it is skipping beats. These symptoms may represent a serious problem that is an emergency. Do not wait to see if the symptoms will go away. Get medical help right away. Call your local emergency services (911 in the [place] Do not drive yourself to the hospital. Summary  Chest pain can be caused by a condition that is serious and requires urgent treatment. It may also be caused by something that is not life-threatening.  If you have chest pain, it is very important to see your health care provider. Your health care provider may do lab tests and other studies to find the cause of your pain.  Follow your health care provider's instructions on taking medicines, making lifestyle changes, and getting emergency treatment if symptoms become worse.  Keep all follow-up visits as told by your health care provider. This includes visits for any further testing if your chest pain does not go away. This information is not intended to replace advice given to you by your health care provider. Make sure you discuss any questions you have with your health care provider. Document Revised: 04/06/2018 Document Reviewed: 04/06/2018 Elsevier Patient Education  [place] Index Test Why am I having this test? The ankle-brachial index (ABI) test is used to diagnose peripheral vascular disease (PVD). PVD is also known as peripheral arterial disease (PAD). PVD is the blocking or hardening of the arteries anywhere within the circulatory system beyond the heart. PVD is caused by:  Cholesterol deposits in your blood vessels (atherosclerosis). This is the most common cause of this condition.  Irritation and swelling (inflammation) in the blood vessels.  Blood clots in the  vessels. Cholesterol deposits cause arteries to narrow. Normal delivery of oxygen to your tissues is affected, causing muscle pain and fatigue. This is called claudication. PVD means that there may also be a buildup of cholesterol:  In your heart. This increases the risk of heart attacks.  In your brain. This increases the risk of strokes. What is being tested? The ankle-brachial index test measures the blood flow in your arms and legs. The blood flow will show if blood vessels in your legs have been narrowed by cholesterol deposits. How do I prepare for this test?  Wear loose clothing.  Do not use any tobacco products, including cigarettes, chewing tobacco, or e-cigarettes, for at least 30 minutes before the test. What happens during the test?  1. You will lie down in a resting position. 2. Your health care provider will use a blood pressure machine and a small ultrasound device (Doppler) to measure the systolic pressures on your upper arms and ankles. Systolic pressure is the pressure inside your arteries when your heart pumps. 3. Systolic pressure measurements will be taken several times, and at several points, on both the ankle and the arm. 4. Your health care provider will divide the  highest systolic pressure of the ankle by the highest systolic pressure of the arm. The result is the ankle-brachial pressure ratio, or ABI. Sometimes this test will be repeated after you have exercised on a treadmill for 5 minutes. You may have leg pain during the exercise portion of the test if you suffer from PVD. If the index number drops after exercise, this may show that PVD is present. How are the results reported? Your test results will be reported as a value that shows the ratio of your ankle pressure to your arm pressure (ABI ratio). Your health care provider will compare your results to normal ranges that were established after testing a large group of people (reference ranges). Reference ranges may  vary among labs and hospitals. For this test, a common reference range is:  ABI ratio of 0.9 to 1.3. What do the results mean? An ABI ratio that is below the reference range is considered abnormal and may indicate PVD in the legs. Talk with your health care provider about what your results mean. Questions to ask your health care provider Ask your health care provider, or the department that is doing the test:  When will my results be ready?  How will I get my results?  What are my treatment options?  What other tests do I need?  What are my next steps? Summary  The ankle-brachial index (ABI) test is used to diagnose peripheral vascular disease (PVD). PVD is also known as peripheral arterial disease (PAD).  The ankle-brachial index test measures the blood flow in your arms and legs.  The highest systolic pressure of the ankle is divided by the highest systolic pressure of the arm. The result is the ABI ratio.  An ABI ratio that is below 0.9 is considered abnormal and may indicate PVD in the legs. This information is not intended to replace advice given to you by your health care provider. Make sure you discuss any questions you have with your health care provider. Document Revised: 07/27/2018 Document Reviewed: 06/28/2017 Elsevier Patient Education  2020 [employer].

## 2019-10-24 NOTE — Telephone Encounter (Signed)
Called and left message stating that daughter is approved to accompany pt.

## 2019-10-25 LAB — COMPLETE METABOLIC PANEL WITH GFR
AG Ratio: 1.8 (calc) (ref 1.0–2.5)
ALT: 7 U/L (ref 6–29)
AST: 17 U/L (ref 10–35)
Albumin: 4.4 g/dL (ref 3.6–5.1)
Alkaline phosphatase (APISO): 62 U/L (ref 37–153)
BUN/Creatinine Ratio: 35 (calc) — ABNORMAL HIGH (ref 6–22)
BUN: 29 mg/dL — ABNORMAL HIGH (ref 7–25)
CO2: 22 mmol/L (ref 20–32)
Calcium: 9.4 mg/dL (ref 8.6–10.4)
Chloride: 108 mmol/L (ref 98–110)
Creat: 0.83 mg/dL (ref 0.50–0.99)
GFR, Est African American: 88 mL/min/{1.73_m2} (ref >=60)
GFR, Est Non African American: 76 mL/min/{1.73_m2} (ref >=60)
Globulin: 2.5 g/dL (calc) (ref 1.9–3.7)
Glucose, Bld: 88 mg/dL (ref 65–99)
Potassium: 4.9 mmol/L (ref 3.5–5.3)
Sodium: 141 mmol/L (ref 135–146)
Total Bilirubin: 0.1 mg/dL — ABNORMAL LOW (ref 0.2–1.2)
Total Protein: 6.9 g/dL (ref 6.1–8.1)

## 2019-10-25 LAB — MICROALBUMIN / CREATININE URINE RATIO
Creatinine, Urine: 65 mg/dL (ref 20–275)
Microalb Creat Ratio: 40 mcg/mg creat — ABNORMAL HIGH (ref <30)
Microalb, Ur: 2.6 mg/dL

## 2019-10-25 LAB — CBC WITH DIFFERENTIAL/PLATELET
Absolute Monocytes: 639 cells/uL (ref 200–950)
Basophils Absolute: 62 cells/uL (ref 0–200)
Basophils Relative: 0.8 %
Eosinophils Absolute: 200 cells/uL (ref 15–500)
Eosinophils Relative: 2.6 %
HCT: 35.3 % (ref 35.0–45.0)
Hemoglobin: 11.8 g/dL (ref 11.7–15.5)
Lymphs Abs: 1971 cells/uL (ref 850–3900)
MCH: 29.4 pg (ref 27.0–33.0)
MCHC: 33.4 g/dL (ref 32.0–36.0)
MCV: 88 fL (ref 80.0–100.0)
MPV: 8.4 fL (ref 7.5–12.5)
Monocytes Relative: 8.3 %
Neutro Abs: 4828 cells/uL (ref 1500–7800)
Neutrophils Relative %: 62.7 %
Platelets: 427 10*3/uL — ABNORMAL HIGH (ref 140–400)
RBC: 4.01 10*6/uL (ref 3.80–5.10)
RDW: 14.4 % (ref 11.0–15.0)
Total Lymphocyte: 25.6 %
WBC: 7.7 10*3/uL (ref 3.8–10.8)

## 2019-10-25 LAB — URINALYSIS, ROUTINE W REFLEX MICROSCOPIC
Bilirubin Urine: NEGATIVE
Glucose, UA: NEGATIVE
Hgb urine dipstick: NEGATIVE
Ketones, ur: NEGATIVE
Leukocytes,Ua: NEGATIVE
Nitrite: NEGATIVE
Protein, ur: NEGATIVE
Specific Gravity, Urine: 1.023 (ref 1.001–1.03)
pH: <=5 (ref 5.0–8.0)

## 2019-10-25 LAB — VITAMIN B12: Vitamin B-12: 864 pg/mL (ref 200–1100)

## 2019-10-25 LAB — IRON, TOTAL/TOTAL IRON BINDING CAP
%SAT: 8 % (calc) — ABNORMAL LOW (ref 16–45)
Iron: 36 ug/dL — ABNORMAL LOW (ref 45–160)
TIBC: 473 mcg/dL (calc) — ABNORMAL HIGH (ref 250–450)

## 2019-10-25 LAB — LIPID PANEL
Cholesterol: 201 mg/dL — ABNORMAL HIGH (ref <200)
HDL: 49 mg/dL — ABNORMAL LOW (ref >=50)
LDL Cholesterol (Calc): 127 mg/dL (calc) — ABNORMAL HIGH
Non-HDL Cholesterol (Calc): 152 mg/dL (calc) — ABNORMAL HIGH (ref <130)
Total CHOL/HDL Ratio: 4.1 (calc) (ref <5.0)
Triglycerides: 137 mg/dL (ref <150)

## 2019-10-25 LAB — VITAMIN D 25 HYDROXY (VIT D DEFICIENCY, FRACTURES): Vit D, 25-Hydroxy: 53 ng/mL (ref 30–100)

## 2019-10-25 LAB — MAGNESIUM: Magnesium: 2 mg/dL (ref 1.5–2.5)

## 2019-10-25 LAB — TSH: TSH: 3.43 mIU/L (ref 0.40–4.50)

## 2019-10-25 LAB — FERRITIN: Ferritin: 7 ng/mL — ABNORMAL LOW (ref 16–288)

## 2019-10-26 ENCOUNTER — Other Ambulatory Visit: Payer: Self-pay | Admitting: Physician Assistant

## 2019-10-26 DIAGNOSIS — M810 Age-related osteoporosis without current pathological fracture: Secondary | ICD-10-CM

## 2019-10-26 DIAGNOSIS — Z1231 Encounter for screening mammogram for malignant neoplasm of breast: Secondary | ICD-10-CM

## 2019-11-13 ENCOUNTER — Ambulatory Visit: Payer: BC Managed Care – PPO | Admitting: Cardiovascular Disease

## 2019-11-20 ENCOUNTER — Ambulatory Visit: Payer: BC Managed Care – PPO | Admitting: Cardiovascular Disease

## 2019-11-29 ENCOUNTER — Encounter: Payer: Self-pay | Admitting: General Practice

## 2020-01-02 ENCOUNTER — Other Ambulatory Visit: Payer: Self-pay | Admitting: Internal Medicine

## 2020-01-14 ENCOUNTER — Other Ambulatory Visit: Payer: BC Managed Care – PPO

## 2020-01-14 ENCOUNTER — Ambulatory Visit: Payer: BC Managed Care – PPO

## 2020-01-24 DIAGNOSIS — H353122 Nonexudative age-related macular degeneration, left eye, intermediate dry stage: Secondary | ICD-10-CM | POA: Diagnosis not present

## 2020-03-04 DIAGNOSIS — H353134 Nonexudative age-related macular degeneration, bilateral, advanced atrophic with subfoveal involvement: Secondary | ICD-10-CM | POA: Diagnosis not present

## 2020-03-04 DIAGNOSIS — Z961 Presence of intraocular lens: Secondary | ICD-10-CM | POA: Diagnosis not present

## 2020-03-04 DIAGNOSIS — H18413 Arcus senilis, bilateral: Secondary | ICD-10-CM | POA: Diagnosis not present

## 2020-03-04 DIAGNOSIS — H26491 Other secondary cataract, right eye: Secondary | ICD-10-CM | POA: Diagnosis not present

## 2020-03-12 DIAGNOSIS — H26491 Other secondary cataract, right eye: Secondary | ICD-10-CM | POA: Diagnosis not present

## 2020-03-28 DIAGNOSIS — H26492 Other secondary cataract, left eye: Secondary | ICD-10-CM | POA: Diagnosis not present

## 2020-04-03 DIAGNOSIS — H26492 Other secondary cataract, left eye: Secondary | ICD-10-CM | POA: Diagnosis not present

## 2020-04-14 ENCOUNTER — Encounter (INDEPENDENT_AMBULATORY_CARE_PROVIDER_SITE_OTHER): Payer: BC Managed Care – PPO | Admitting: Ophthalmology

## 2020-04-24 ENCOUNTER — Encounter (INDEPENDENT_AMBULATORY_CARE_PROVIDER_SITE_OTHER): Payer: BC Managed Care – PPO | Admitting: Ophthalmology

## 2020-06-25 ENCOUNTER — Other Ambulatory Visit: Payer: Self-pay

## 2020-06-25 ENCOUNTER — Ambulatory Visit: Payer: BC Managed Care – PPO | Admitting: Physician Assistant

## 2020-06-25 VITALS — Temp 97.7°F

## 2020-06-25 DIAGNOSIS — U071 COVID-19: Secondary | ICD-10-CM | POA: Diagnosis not present

## 2020-06-25 DIAGNOSIS — Z1152 Encounter for screening for COVID-19: Secondary | ICD-10-CM

## 2020-06-25 LAB — POC COVID19 BINAXNOW: SARS Coronavirus 2 Ag: POSITIVE — AB

## 2020-06-25 MED ORDER — DOXYCYCLINE HYCLATE 100 MG PO CAPS: ORAL_CAPSULE | ORAL | 0 refills | Status: DC

## 2020-06-25 MED ORDER — PREDNISONE 20 MG PO TABS: ORAL_TABLET | ORAL | 0 refills | Status: DC

## 2020-06-25 MED ORDER — BENZONATATE 200 MG PO CAPS: 200.0000 mg | ORAL_CAPSULE | Freq: Three times a day (TID) | ORAL | 0 refills | Status: DC | PRN

## 2020-06-25 NOTE — Progress Notes (Signed)
THIS ENCOUNTER IS A VIRTUAL VISIT DUE TO COVID-19 - PATIENT WAS NOT SEEN IN THE OFFICE.  PATIENT HAS CONSENTED TO VIRTUAL VISIT / TELEMEDICINE VISIT   Virtual Visit via telephone Note  I connected with [name] on 06/25/2020 by telephone.  I verified that I am speaking with the correct person using two identifiers.    I discussed the limitations of evaluation and management by telemedicine and the availability of in person appointments. The patient expressed understanding and agreed to proceed.  History of Present Illness:  Temp 97.7 F (36.5 C)   SpO2 94%   64 y.o. patient with hx of COPD, asthma, malnutrition, carotid stenosis, TIA, HTN, contacted office due to URI sx suspect for covid 67, husband tested positive friday. They were found to be positive on rapid screen, OV was converted to telephone visit to minimize exposure in office. This patient has not been vaccinated for covid 19.   Sx began 2 weeks ago. She has cough with yellow mucus, loss of taste, sinus congestion, fever, chills with night sweats the week before, no worsening SOB, has been using her nebulizer at home and using advair, no chest pain but when she coughs she has pain in between her shoulders blades. No chest pain with deep breath, no palpitations, no leg swelling, not lethargic, she is still moving around.   She does not have a pulse ox at home.   Medications   Current Outpatient Medications (Cardiovascular):  .  ezetimibe (ZETIA) 10 MG tablet, TAKE 1 TABLET BY MOUTH EVERY DAY  Current Outpatient Medications (Respiratory):  .  albuterol (PROVENTIL) (2.5 MG/3ML) 0.083% nebulizer solution, Take 3 mLs (2.5 mg total) by nebulization every 6 (six) hours as needed for wheezing or shortness of breath. Marland Kitchen  albuterol (VENTOLIN HFA) 108 (90 Base) MCG/ACT inhaler, 2 Inhalations 15 to 20  minutes apart every 4 hours to Rescue Asthma .  Fluticasone-Salmeterol (ADVAIR DISKUS) 250-50 MCG/DOSE AEPB, Inhale 1 puff into the  lungs every 12 (twelve) hours. Marland Kitchen  ipratropium (ATROVENT) 0.02 % nebulizer solution, TAKE 2.5 MLS BY NEBULIZATION EVERY 4 HOURS AS NEEDED FOR WHEEZING OR SHORTNESS OF BREATH.  Current Outpatient Medications (Analgesics):  .  aspirin EC 81 MG tablet, Take 1 tablet (81 mg total) by mouth daily.  Current Outpatient Medications (Hematological):  Marland Kitchen  IRON, FERROUS SULFATE, PO, Take 65 mg by mouth 2 (two) times daily.  Current Outpatient Medications (Other):  Marland Kitchen  Dietary Management Product (TOZAL PO), Take by mouth. Marland Kitchen  omeprazole (PRILOSEC) 20 MG capsule, TAKE 1 CAPSULE 2 X /DAY FOR INDIGESTION & HEARBURN  Allergies:  Allergies  Allergen Reactions  . Decadron [Dexamethasone] Other (See Comments)    Stomach issues, history of GI bleed  . Celebrex [Celecoxib] Other (See Comments)    GI bleed  . Crestor [Rosuvastatin] Other (See Comments)    Made legs hurt  . Penicillins Other (See Comments)    Has patient had a PCN reaction causing immediate rash, facial/tongue/throat swelling, SOB or lightheadedness with hypotension: Yes Has patient had a PCN reaction causing severe rash involving mucus membranes or skin necrosis: No Has patient had a PCN reaction that required hospitalization: No Has patient had a PCN reaction occurring within the last 10 years: Yes If all of the above answers are "NO", then may proceed with Cephalosporin use.   REACTION: whelps  . Pravastatin Other (See Comments)    Muscle aches    Problem list She has Mixed hyperlipidemia; GERD; Recurrent Upper GI bleed;  COPD (chronic obstructive pulmonary disease) ([place]); Asthma-COPD overlap syndrome ([place]); Vitamin D deficiency; HTN (hypertension); Medication management; Carotid stenosis, asymptomatic; Osteoporosis; Depression, major, in remission ([place]); Protein-calorie malnutrition, moderate ([place]); Iron deficiency anemia; TIA (transient ischemic attack); Thoracic aorta atherosclerosis ([place]); and Statin intolerance on their problem  list.   Social History:   reports that she quit smoking about 2 years ago. Her smoking use included cigarettes. She has a 9.00 pack-year smoking history. She has never used smokeless tobacco. She reports previous alcohol use. She reports that she does not use drugs.   Observations/Objective:  General : Well sounding patient in no apparent distress HEENT: no hoarseness, no cough for duration of visit Lungs: speaks in complete sentences, no audible wheezing, no apparent distress Neurological: alert, oriented x 3 Psychiatric: pleasant, judgement appropriate   Assessment and Plan:  COVID-19  Covid 19 positive per rapid screening test in unvaccinated  Currently symptoms are mild  Risk factors include  COPD, asthma, malnutrition, carotid stenosis, TIA, HTN. Regular breathing exercises, proning EC bASA daily for clot prevention unless contraindicated, regular walking/calf exercises Take tylenol PRN temp 101+ Push hydration Sx supportive therapy Steroid taper was given, with GI bleed history reasons to stop or call was given to the patient Immune support with vitamin C, zinc, vitamin D reviewed Follow up via mychart or telephone if needed Advised patient obtain O2 monitor; present to ED if persistently <88% or with severe dyspnea, any CP, fever uncontrolled by tylenol, confusion, sudden decline Should remain in isolation until at least 10 days from onset of sx, 24-48 hours fever free without tylenol, sx such as cough are improved.    Follow Up Instructions:  I discussed the assessment and treatment plan with the patient. The patient was provided an opportunity to ask questions and all were answered. The patient agreed with the plan and demonstrated an understanding of the instructions.   The patient was advised to call back or seek an in-person evaluation if the symptoms worsen or if the condition fails to improve as anticipated.  I provided 15 minutes of non-face-to-face time during  this encounter.   Vicie Mutters, PA-C

## 2020-06-25 NOTE — Patient Instructions (Addendum)
Please pick up a pulse oximeter at the pharmacy  Monitor regularly - 96-100% is normal Mildly low 92%+ Caution 88-92% - monitor closely  <88% go to ED   Please do vitamin C 1000mg  twice a day- stop if you have any worsening heart burn or diarrhea.   Can also do zinc 50 mg daily. This helps with immune system with vitamin D.    You can take tylenol for fevers above 101 if you feel uncomfortable. Remember a fever itself is not bad, the tylenol is more for your comfort. See the max of tylenol below.  Please do breathing exercises. Make sure that you are taking deep breaths and trying to walk around the room as much as possible.  Please lay "prone" or on your belly for up to 4 hours a day, you can split this up to 30 minute or 1 hour increments but this helps get oxygen to certain places of your lungs.    Please pump your feet like your are pedaling a bike to prevent clots in your legs.     Push fluids    If you have worsening shortness of breath, unable to complete full sentences, are panting, please contact the office immediately for call 911. Let them know you are being monitored for coronavirus.         To stop home isolation, you will need all three of the statements below.  1) You need to be fever free for 1 day WITHOUT tylenol.  2) Your symptoms such as cough, shortness of breath need to improve.  3) It needs to be at least 10 days since your symptoms first appeared     After you stop home isolation, please continue to wear a mask in public and continue hand washing and contact precautions.     INFORMATION ABOUT YOUR [facility] Can walk into [address] building for an Insurance account manager. They will have the order and take you back. You do not any paper work, I should get the result back today or tomorrow. This order is good for a year.  Can call [phone] to schedule an appointment if you wish.

## 2020-06-26 ENCOUNTER — Other Ambulatory Visit: Payer: Self-pay | Admitting: Internal Medicine

## 2020-08-13 ENCOUNTER — Encounter (INDEPENDENT_AMBULATORY_CARE_PROVIDER_SITE_OTHER): Payer: BC Managed Care – PPO | Admitting: Ophthalmology

## 2020-08-13 DIAGNOSIS — H43813 Vitreous degeneration, bilateral: Secondary | ICD-10-CM | POA: Diagnosis not present

## 2020-08-13 DIAGNOSIS — H353134 Nonexudative age-related macular degeneration, bilateral, advanced atrophic with subfoveal involvement: Secondary | ICD-10-CM | POA: Diagnosis not present

## 2020-09-25 ENCOUNTER — Encounter: Payer: BC Managed Care – PPO | Admitting: Physician Assistant

## 2020-10-23 ENCOUNTER — Encounter: Payer: BC Managed Care – PPO | Admitting: Adult Health Nurse Practitioner

## 2020-11-03 ENCOUNTER — Encounter: Payer: Self-pay | Admitting: Adult Health Nurse Practitioner

## 2020-11-26 ENCOUNTER — Encounter: Payer: Self-pay | Admitting: Adult Health Nurse Practitioner

## 2020-12-11 DIAGNOSIS — S5291XA Unspecified fracture of right forearm, initial encounter for closed fracture: Secondary | ICD-10-CM | POA: Diagnosis not present

## 2020-12-11 DIAGNOSIS — S52611A Displaced fracture of right ulna styloid process, initial encounter for closed fracture: Secondary | ICD-10-CM | POA: Diagnosis not present

## 2020-12-11 DIAGNOSIS — S52601A Unspecified fracture of lower end of right ulna, initial encounter for closed fracture: Secondary | ICD-10-CM | POA: Diagnosis not present

## 2020-12-11 DIAGNOSIS — S52531A Colles' fracture of right radius, initial encounter for closed fracture: Secondary | ICD-10-CM | POA: Diagnosis not present

## 2020-12-11 DIAGNOSIS — S52691A Other fracture of lower end of right ulna, initial encounter for closed fracture: Secondary | ICD-10-CM | POA: Diagnosis not present

## 2020-12-11 DIAGNOSIS — S52591A Other fractures of lower end of right radius, initial encounter for closed fracture: Secondary | ICD-10-CM | POA: Diagnosis not present

## 2020-12-11 DIAGNOSIS — W010XXA Fall on same level from slipping, tripping and stumbling without subsequent striking against object, initial encounter: Secondary | ICD-10-CM | POA: Diagnosis not present

## 2020-12-11 DIAGNOSIS — S52201A Unspecified fracture of shaft of right ulna, initial encounter for closed fracture: Secondary | ICD-10-CM | POA: Diagnosis not present

## 2020-12-11 DIAGNOSIS — M25531 Pain in right wrist: Secondary | ICD-10-CM | POA: Diagnosis not present

## 2020-12-11 DIAGNOSIS — S52501A Unspecified fracture of the lower end of right radius, initial encounter for closed fracture: Secondary | ICD-10-CM | POA: Insufficient documentation

## 2020-12-15 ENCOUNTER — Encounter: Payer: Self-pay | Admitting: Adult Health Nurse Practitioner

## 2020-12-17 DIAGNOSIS — X58XXXA Exposure to other specified factors, initial encounter: Secondary | ICD-10-CM | POA: Diagnosis not present

## 2020-12-17 DIAGNOSIS — S52531A Colles' fracture of right radius, initial encounter for closed fracture: Secondary | ICD-10-CM | POA: Diagnosis not present

## 2020-12-17 DIAGNOSIS — Y999 Unspecified external cause status: Secondary | ICD-10-CM | POA: Diagnosis not present

## 2020-12-17 DIAGNOSIS — S52571A Other intraarticular fracture of lower end of right radius, initial encounter for closed fracture: Secondary | ICD-10-CM | POA: Diagnosis not present

## 2020-12-17 DIAGNOSIS — G8918 Other acute postprocedural pain: Secondary | ICD-10-CM | POA: Diagnosis not present

## 2020-12-21 ENCOUNTER — Other Ambulatory Visit: Payer: Self-pay | Admitting: Internal Medicine

## 2021-01-01 ENCOUNTER — Encounter: Payer: Self-pay | Admitting: Adult Health Nurse Practitioner

## 2021-01-02 DIAGNOSIS — S52531D Colles' fracture of right radius, subsequent encounter for closed fracture with routine healing: Secondary | ICD-10-CM | POA: Diagnosis not present

## 2021-01-02 DIAGNOSIS — Z4789 Encounter for other orthopedic aftercare: Secondary | ICD-10-CM | POA: Insufficient documentation

## 2021-01-15 DIAGNOSIS — M25531 Pain in right wrist: Secondary | ICD-10-CM | POA: Diagnosis not present

## 2021-01-15 DIAGNOSIS — S52531D Colles' fracture of right radius, subsequent encounter for closed fracture with routine healing: Secondary | ICD-10-CM | POA: Diagnosis not present

## 2021-01-22 DIAGNOSIS — H353122 Nonexudative age-related macular degeneration, left eye, intermediate dry stage: Secondary | ICD-10-CM | POA: Diagnosis not present

## 2021-02-27 DIAGNOSIS — S52531D Colles' fracture of right radius, subsequent encounter for closed fracture with routine healing: Secondary | ICD-10-CM | POA: Diagnosis not present

## 2021-03-27 DIAGNOSIS — S52531D Colles' fracture of right radius, subsequent encounter for closed fracture with routine healing: Secondary | ICD-10-CM | POA: Diagnosis not present

## 2021-03-27 DIAGNOSIS — Z4789 Encounter for other orthopedic aftercare: Secondary | ICD-10-CM | POA: Diagnosis not present

## 2021-03-27 DIAGNOSIS — S52691A Other fracture of lower end of right ulna, initial encounter for closed fracture: Secondary | ICD-10-CM | POA: Diagnosis not present

## 2021-06-25 DIAGNOSIS — S52691A Other fracture of lower end of right ulna, initial encounter for closed fracture: Secondary | ICD-10-CM | POA: Diagnosis not present

## 2021-06-25 DIAGNOSIS — S52531D Colles' fracture of right radius, subsequent encounter for closed fracture with routine healing: Secondary | ICD-10-CM | POA: Diagnosis not present

## 2021-07-19 ENCOUNTER — Other Ambulatory Visit: Payer: Self-pay | Admitting: Adult Health

## 2021-09-12 DIAGNOSIS — J209 Acute bronchitis, unspecified: Secondary | ICD-10-CM | POA: Diagnosis not present

## 2021-09-12 DIAGNOSIS — R059 Cough, unspecified: Secondary | ICD-10-CM | POA: Diagnosis not present

## 2021-10-20 DIAGNOSIS — S52531D Colles' fracture of right radius, subsequent encounter for closed fracture with routine healing: Secondary | ICD-10-CM | POA: Diagnosis not present

## 2021-10-27 ENCOUNTER — Encounter: Payer: Self-pay | Admitting: Adult Health Nurse Practitioner

## 2021-11-03 ENCOUNTER — Encounter: Payer: Self-pay | Admitting: Adult Health Nurse Practitioner

## 2021-11-16 DIAGNOSIS — T8484XA Pain due to internal orthopedic prosthetic devices, implants and grafts, initial encounter: Secondary | ICD-10-CM | POA: Diagnosis not present

## 2021-11-16 DIAGNOSIS — G8918 Other acute postprocedural pain: Secondary | ICD-10-CM | POA: Diagnosis not present

## 2021-11-16 DIAGNOSIS — S52531D Colles' fracture of right radius, subsequent encounter for closed fracture with routine healing: Secondary | ICD-10-CM | POA: Diagnosis not present

## 2021-11-17 ENCOUNTER — Encounter: Payer: Self-pay | Admitting: Internal Medicine

## 2021-12-04 DIAGNOSIS — S52531D Colles' fracture of right radius, subsequent encounter for closed fracture with routine healing: Secondary | ICD-10-CM | POA: Diagnosis not present

## 2022-01-05 DIAGNOSIS — R509 Fever, unspecified: Secondary | ICD-10-CM | POA: Diagnosis not present

## 2022-01-05 DIAGNOSIS — J209 Acute bronchitis, unspecified: Secondary | ICD-10-CM | POA: Diagnosis not present

## 2022-01-15 DIAGNOSIS — Z4789 Encounter for other orthopedic aftercare: Secondary | ICD-10-CM | POA: Diagnosis not present

## 2022-01-15 DIAGNOSIS — S52531D Colles' fracture of right radius, subsequent encounter for closed fracture with routine healing: Secondary | ICD-10-CM | POA: Diagnosis not present

## 2022-03-10 ENCOUNTER — Ambulatory Visit: Payer: Medicare Other | Admitting: Nurse Practitioner

## 2022-03-17 NOTE — Progress Notes (Unsigned)
Medicare Annual Wellness Visit  Assessment and Plan:  Encounter for medicare annual wellness visit 1 year GET [facility] AND DEXA Get CXR  COPD exacerbation ([place]) -     DG Chest 2 View; Future Treated with decadron and zpak- improved Continue advair and albuterol as needed   Mixed hyperlipidemia -     Lipid Profile -  TSH - NOT AT GOAL- WILL TRY NEXLETOL/ZETIA- IF STILL NOT AT GOAL LESS THAN 70 WILL REFER TO LIPID CLINIC FOR POSSIBLE PSK9   Chronic obstructive pulmonary disease, unspecified COPD type (HCC) No triggers, well controlled symptoms, cont to monitor  Essential hypertension - continue medications, DASH diet, exercise and monitor at home. Call if greater than 130/80.  -     CBC with Diff -     COMPLETE METABOLIC PANEL WITH GFR -     TSH -     Urinalysis, Routine w reflex microscopic -     Microalbumin / Creatinine Urine Ratio  Depression, major, in remission ([place]) - continue medications, stress management techniques discussed, increase water, good sleep hygiene discussed, increase exercise, and increase veggies.   Protein-calorie malnutrition, moderate (HCC) CONTINUE ENSURE  Iron deficiency anemia secondary to inadequate dietary iron intake -CBC  Vitamin D deficiency -     Vitamin D (25 hydroxy)  Asymptomatic stenosis of right carotid artery S/P CEA  Asthma-COPD overlap syndrome ([place]) CONTINUE ADVAIR, SHE IS NOT SMOKING  Thoracic aorta atherosclerosis (HCC) Control blood pressure, cholesterol, glucose, increase exercise.   Statin intolerance WILL TRY NEXLETOL/ZETIA- IF STILL NOT AT GOAL LESS THAN 70 WILL REFER TO LIPID CLINIC FOR POSSIBLE PSK9  TIA (transient ischemic attack) Control blood pressure, cholesterol, glucose, increase exercise.   Medication management -     Magnesium   Discussed med's effects and SE's. Screening labs and tests as requested with regular follow-up as recommended. Future Appointments  Date Time Provider [place]   03/18/2022 11:30 AM Magda Bernheim, NP [facility] None  03/21/2023 11:00 AM Darrol Jump, NP [facility] None    HPI  66 y.o. female  presents for a complete physical.  Her blood pressure has been controlled at home, today their BP is     She had recent COPD exacerbation, given zpak and decadron, states after day 4 of the decadron she had nausea and epigastric pain. She states worse with lying down, she had shooting chest pain across her chest last monday, lasted 15 mins and eased up after water and walking around some and with beltching. Would go to sleep and wake her up 2 hours later. She had palpitations, some SOB with lying down, some pain into her left arm, no other accompaniments.  She also has pain between her shoulder blades, worse with activity and picking things up, had that with the CP as well. She has history of GI bleed, states some of the pain got better after stopping the decadron, she is still on prilosec 20 mg BID. She has not black stool or blood in stool.   EGD 2017- showed gastric ulcers, is on PPI BID.  last CXR 08/2018 Echo 2018, s/p right CEA, last carotid US 2018, CTA chest 08/2016.  Legally blind in right eye, on Tozal, she is following with Dr. Eddie Dibbles.   She has COPD and is on Advair/albuterol.  Quit smoking 2 years in Nov.   She does not workout. She denies chest pain, shortness of breath, dizziness.   s/p right CEA due to syncopal episode in 2016 with Dr. Donnetta Hutching.  She is on bASA.   She had DEXA 09/2015. Has history of cervical compression fractures. She has had the MGM and DEXA rescheduled due to the pandemic. Off fosamax due to GI bleed/GERD.  She is on cholesterol medication, ZETIA EVERY OTHER DAY but has intolerance of statins, NEVER STARTED THE NEXLIZET GIVEN.  Her cholesterol is not at goal of less than 70. The cholesterol last visit was:   Lab Results  Component Value Date   CHOL 201 (H) 10/24/2019   HDL 49 (L) 10/24/2019   LDLCALC 127 (H) 10/24/2019   TRIG  137 10/24/2019   CHOLHDL 4.1 10/24/2019   Last A1C in the office was:  Lab Results  Component Value Date   HGBA1C 4.8 06/23/2016  Patient is on Vitamin D supplement.   Lab Results  Component Value Date   VD25OH 53 10/24/2019   BMI is There is no height or weight on file to calculate BMI., she is drinking ensure/mild and trying to gain weight.  Wt Readings from Last 3 Encounters:  10/24/19 124 lb (56.2 kg)  06/04/19 112 lb 9.6 oz (51.1 kg)  12/27/18 111 lb 6.4 oz (50.5 kg)    Current Medications:  Current Outpatient Medications on File Prior to Visit  Medication Sig Dispense Refill   albuterol (PROVENTIL) (2.5 MG/3ML) 0.083% nebulizer solution Take 3 mLs (2.5 mg total) by nebulization every 6 (six) hours as needed for wheezing or shortness of breath. 75 mL 12   albuterol (VENTOLIN HFA) 108 (90 Base) MCG/ACT inhaler 2 Inhalations 15 to 20  minutes apart every 4 hours to Rescue Asthma 18 g 3   aspirin EC 81 MG tablet Take 1 tablet (81 mg total) by mouth daily. 30 tablet 0   benzonatate (TESSALON) 200 MG capsule Take 1 capsule (200 mg total) by mouth 3 (three) times daily as needed for cough (Max: '600mg'$  per day). 30 capsule 0   Dietary Management Product (TOZAL PO) Take by mouth.     doxycycline (VIBRAMYCIN) 100 MG capsule Take 1 capsule twice daily with food 20 capsule 0   ezetimibe (ZETIA) 10 MG tablet TAKE 1 TABLET BY MOUTH EVERY DAY 90 tablet 1   Fluticasone-Salmeterol (ADVAIR DISKUS) 250-50 MCG/DOSE AEPB Inhale 1 puff into the lungs every 12 (twelve) hours.     ipratropium (ATROVENT) 0.02 % nebulizer solution TAKE 2.5 MLS BY NEBULIZATION EVERY 4 HOURS AS NEEDED FOR WHEEZING OR SHORTNESS OF BREATH. 250 mL 2   IRON, FERROUS SULFATE, PO Take 65 mg by mouth 2 (two) times daily.     omeprazole (PRILOSEC) 20 MG capsule Take  1 capsule  2 x /day  To Prevent  Indigestion & Heartburn            /TAKE 1 CAPSULE 2 X /DAY FOR INDIGESTION & HEARBURN 180 capsule 3   predniSONE (DELTASONE) 20 MG  tablet 2 tablets daily for 3 days, 1 tablet daily for 4 days. 10 tablet 0   No current facility-administered medications on file prior to visit.   Health Maintenance:   Immunization History  Administered Date(s) Administered   Pneumococcal Polysaccharide-23 12/18/2014   Pneumococcal-Unspecified 05/18/2001   Td 12/16/2004   Tdap 01/02/2015   Tetanus: 2016 Pneumovax: 2016 Prevnar 13: due when 67 Flu vaccine: declines Zostavax: N/A  LMP: s/p hysterectomy Pap: Dr. Harrington Challenger MGM: 12/2012  OVER DUE DEXA: 09/2015 osteoporisis , on fosamax x 2014 for 4 years- needs REPEAT- overdue Colonoscopy: 12/2015 EGD: 2017 CT chest 11/2014 CXR 2019 MRA brain- 08/19/2016  Myocardial perfusion- 2009 Echo: 2017 Carotid US 11/2014 AB [place] 08/2014  Last Dental Exam: [facility], [facility] Last Eye Exam: Dr. Augusto Gamble  Patient Care Team: Unk Pinto, MD as PCP - General (Internal Medicine) Irene Shipper, MD as Consulting Physician (Gastroenterology) Gus Height, MD (Inactive) as Consulting Physician (Obstetrics and Gynecology)  Dr. Donnetta Hutching  Allergies:  Allergies  Allergen Reactions   Decadron [Dexamethasone] Other (See Comments)    Stomach issues, history of GI bleed   Celebrex [Celecoxib] Other (See Comments)    GI bleed   Crestor [Rosuvastatin] Other (See Comments)    Made legs hurt   Penicillins Other (See Comments)    Has patient had a PCN reaction causing immediate rash, facial/tongue/throat swelling, SOB or lightheadedness with hypotension: Yes Has patient had a PCN reaction causing severe rash involving mucus membranes or skin necrosis: No Has patient had a PCN reaction that required hospitalization: No Has patient had a PCN reaction occurring within the last 10 years: Yes If all of the above answers are "NO", then may proceed with Cephalosporin use.   REACTION: whelps   Pravastatin Other (See Comments)    Muscle aches   Medical History:  Past Medical History:  Diagnosis Date    Allergy    Anemia    Arthritis    Asthma    Blood transfusion without reported diagnosis    Cataract    COPD (chronic obstructive pulmonary disease) ([place])    Duodenal ulcer    Emphysema of lung ([place])    Family history of malignant neoplasm of gastrointestinal tract    GERD (gastroesophageal reflux disease)    Hepatitis    had in 6th grade pt unsure which kind   Hiatal hernia    HTN (hypertension) 10/03/2014   Hx of colonic polyps    Hyperlipemia    Internal hemorrhoids    Osteoporosis    Peripheral vascular disease (HCC)    Pneumonia    hx of   Shortness of breath dyspnea    with ambulation   Stroke ([place])    Vitamin D deficiency    SURGICAL HISTORY She  has a past surgical history that includes Abdominal hysterectomy; Appendectomy; Total hip arthroplasty (Right); Lumbar disc surgery; Elbow surgery (Right); Wrist surgery (Left); Hand surgery (Left); Esophagogastroduodenoscopy (10/2008); Colonoscopy; Upper gastrointestinal endoscopy; trigeminal neuralgia surgery; Endarterectomy (Right, 12/16/2014); and Cataract extraction, bilateral. FAMILY HISTORY Her family history includes AAA (abdominal aortic aneurysm) in her brother and mother; Colon cancer in an other family member; Colon polyps in her mother; Diabetes in her maternal grandfather; Heart attack in her brother, father, and mother; Heart disease in her brother, father, maternal grandfather, maternal grandmother, and mother; Hyperlipidemia in her brother, father, and mother; Hypertension in her brother, father, and mother; Inflammatory bowel disease in her mother; Stroke in her mother; Varicose Veins in her daughter and mother. SOCIAL HISTORY She  reports that she quit smoking about 4 years ago. Her smoking use included cigarettes. She has a 9.00 pack-year smoking history. She has never used smokeless tobacco. She reports that she does not currently use alcohol. She reports that she does not use drugs.  Review of  Systems: ROS  Physical Exam: Estimated body mass index is 22.68 kg/m as calculated from the following:   Height as of 12/27/18: '5\' 2"'$  (1.575 m).   Weight as of 10/24/19: 124 lb (56.2 kg). There were no vitals taken for this visit. General Appearance: Thin appearing, in no apparent distress.  Eyes: PERRLA, EOMs,  conjunctiva no swelling or erythema Sinuses: No Frontal/maxillary tenderness  ENT/Mouth: Ext aud canals clear, normal light reflex with TMs without erythema, bulging.  No erythema, swelling, or exudate on post pharynx. Tonsils not swollen or erythematous. Hearing normal.  Neck: Supple, thyroid normal. No bruits  Respiratory: Respiratory effort normal, diffuse decreased breath sounds without rhonchi/wheezing   Cardio: RRR without murmurs, rubs or gallops. 1+ TP/DP pulses bilaterally without edema.  Chest: symmetric, with normal excursions and percussion.  Breasts: declines.  Abdomen: Soft, nontender, no guarding, rebound, hernias, masses, or organomegaly. .  Lymphatics: Non tender without lymphadenopathy.  Genitourinary: declines Musculoskeletal: Full ROM all peripheral extremities,5/5 strength, and normal gait.  Skin:Warm, dry without rashes, lesions, ecchymosis. Neuro: Cranial nerves intact, reflexes equal bilaterally. Normal muscle tone, no cerebellar symptoms.Sensation intact.  Psych: Awake and oriented X 3, normal affect, Insight and Judgment appropriate.   EKG: WNL no ST changes, PRWP AORTA SCAN: declines  Naomy Esham W Eryc Bodey 4:56 PM [facility]

## 2022-03-18 ENCOUNTER — Ambulatory Visit (INDEPENDENT_AMBULATORY_CARE_PROVIDER_SITE_OTHER): Payer: Medicare Other | Admitting: Nurse Practitioner

## 2022-03-18 ENCOUNTER — Encounter: Payer: Self-pay | Admitting: Hematology and Oncology

## 2022-03-18 ENCOUNTER — Encounter: Payer: Self-pay | Admitting: Nurse Practitioner

## 2022-03-18 VITALS — BP 124/68 | HR 103 | Temp 97.7°F | Wt 115.4 lb

## 2022-03-18 DIAGNOSIS — F325 Major depressive disorder, single episode, in full remission: Secondary | ICD-10-CM | POA: Diagnosis not present

## 2022-03-18 DIAGNOSIS — Z789 Other specified health status: Secondary | ICD-10-CM

## 2022-03-18 DIAGNOSIS — D509 Iron deficiency anemia, unspecified: Secondary | ICD-10-CM

## 2022-03-18 DIAGNOSIS — I10 Essential (primary) hypertension: Secondary | ICD-10-CM

## 2022-03-18 DIAGNOSIS — E559 Vitamin D deficiency, unspecified: Secondary | ICD-10-CM

## 2022-03-18 DIAGNOSIS — J441 Chronic obstructive pulmonary disease with (acute) exacerbation: Secondary | ICD-10-CM

## 2022-03-18 DIAGNOSIS — I700 Atherosclerosis of aorta: Secondary | ICD-10-CM | POA: Diagnosis not present

## 2022-03-18 DIAGNOSIS — E440 Moderate protein-calorie malnutrition: Secondary | ICD-10-CM

## 2022-03-18 DIAGNOSIS — E782 Mixed hyperlipidemia: Secondary | ICD-10-CM

## 2022-03-18 DIAGNOSIS — Z79899 Other long term (current) drug therapy: Secondary | ICD-10-CM

## 2022-03-18 DIAGNOSIS — D649 Anemia, unspecified: Secondary | ICD-10-CM | POA: Diagnosis not present

## 2022-03-18 DIAGNOSIS — Z8673 Personal history of transient ischemic attack (TIA), and cerebral infarction without residual deficits: Secondary | ICD-10-CM

## 2022-03-18 DIAGNOSIS — Z131 Encounter for screening for diabetes mellitus: Secondary | ICD-10-CM

## 2022-03-18 DIAGNOSIS — R6889 Other general symptoms and signs: Secondary | ICD-10-CM | POA: Diagnosis not present

## 2022-03-18 DIAGNOSIS — J449 Chronic obstructive pulmonary disease, unspecified: Secondary | ICD-10-CM

## 2022-03-18 DIAGNOSIS — I6521 Occlusion and stenosis of right carotid artery: Secondary | ICD-10-CM

## 2022-03-18 DIAGNOSIS — Z0001 Encounter for general adult medical examination with abnormal findings: Secondary | ICD-10-CM

## 2022-03-18 DIAGNOSIS — K219 Gastro-esophageal reflux disease without esophagitis: Secondary | ICD-10-CM

## 2022-03-18 DIAGNOSIS — M810 Age-related osteoporosis without current pathological fracture: Secondary | ICD-10-CM

## 2022-03-18 DIAGNOSIS — Z0000 Encounter for general adult medical examination without abnormal findings: Secondary | ICD-10-CM

## 2022-03-18 MED ORDER — FLUTICASONE-SALMETEROL 250-50 MCG/ACT IN AEPB: 1.0000 | INHALATION_SPRAY | Freq: Two times a day (BID) | RESPIRATORY_TRACT | 1 refills | Status: DC

## 2022-03-18 MED ORDER — IPRATROPIUM-ALBUTEROL 0.5-2.5 (3) MG/3ML IN SOLN: 3.0000 mL | RESPIRATORY_TRACT | 0 refills | Status: DC | PRN

## 2022-03-18 MED ORDER — OMEPRAZOLE 20 MG PO CPDR: DELAYED_RELEASE_CAPSULE | ORAL | 3 refills | Status: DC

## 2022-03-18 MED ORDER — ALBUTEROL SULFATE HFA 108 (90 BASE) MCG/ACT IN AERS: INHALATION_SPRAY | RESPIRATORY_TRACT | 3 refills | Status: DC

## 2022-03-18 MED ORDER — EZETIMIBE 10 MG PO TABS: 10.0000 mg | ORAL_TABLET | Freq: Every day | ORAL | 11 refills | Status: DC

## 2022-03-18 MED ORDER — ALBUTEROL SULFATE (2.5 MG/3ML) 0.083% IN NEBU: 2.5000 mg | INHALATION_SOLUTION | Freq: Four times a day (QID) | RESPIRATORY_TRACT | 12 refills | Status: DC | PRN

## 2022-03-18 NOTE — Patient Instructions (Signed)
Chest xray is ordered to evaluate lungs [facility] [place] [address] [place] in M-F 8:30-3:45  Schedule mammogram at breast center  Electronic Cigarette Information  Electronic cigarettes, or e-cigarettes, are battery-operated devices that deliver nicotine--a very addictive drug--to the body. They come in many shapes, including in the shape of a cigarette, pipe, pen, and even a USB memory stick. Electronic cigarettes may be called e-cigs, e-hookahs, vape pens, and electronic nicotine delivery systems (ENDS). E-cigarettes have a cartridge that contains a liquid form of nicotine. When a person uses the device, the liquid heats up. It then becomes a vapor that a person inhales. Using e-cigarettes may be called vaping. Nicotine is thought to increase your risk for certain types of cancer. In addition to nicotine, e-cigarettes may contain other harmful and cancer-causing chemicals, including: Formaldehyde. Acetaldehyde. Heavy metals. Ultrafine particles that can get inhaled deep into the lungs. Chemical colorings and flavorings. It is not clear how much nicotine you get when vaping, and it is hard to know what chemicals are in the vaping liquids. The health effects of vaping are not completely known, but you should be aware of the possible dangers of using these products. Some people may use e-cigarettes to quit smoking tobacco. However, this has not been proven to work, and the Transport planner (FDA) has not approved e-cigarettes for this purpose. How can using electronic cigarettes affect me? You may be at risk for developing a dangerous lung disease. There are reports of an increasing number of cases involving serious lung problems, and even death, associated with e-cigarette use. Your risk may be even higher if you: Buy e-cigarettes or vaping oils off the street. Add any substances to the e-cigarettes that are not intended by the manufacturer. Vaping may make you crave  nicotine. Nicotine does the following: Changes your blood sugar levels. Increases your heart rate, blood pressure, and breathing rate. Increases your risk of developing blood clots (hypercoagulable state) and diabetes. Increases your risk of gum disease that may lead to losing teeth. If you smoke e-cigarettes, you may be more likely to start smoking or to smoke more tobacco cigarettes. Becoming addicted to nicotine may make your brain more sensitive to other addictive drugs. You may move to other addictive substances. You may be in danger of overdosing on nicotine. Nicotine poisoning can cause nausea, vomiting, seizures, and trouble breathing. Vaping has also been linked to decreases in memory and attention span in children and teens. If you are pregnant, the nicotine in e-cigarettes may be harmful to your baby. Nicotine can cause: Brain or lung problems for your baby. Your baby to be born too early. Your baby to be born with a low birth weight. What actions can I take to stop vaping? If you can, stop vaping on your own before you become addicted to nicotine. If you need help quitting, ask your health care provider. There are three effective ways to fight nicotine addiction: Nicotine replacement therapy. Using nicotine gum or a nicotine patch blocks your craving for nicotine. Over time, you can reduce the amount of nicotine you use until you can stop using nicotine completely without having cravings. Prescription medicines approved to fight nicotine addiction. These stop nicotine cravings or block the effects of nicotine. Behavioral therapy. This may include: A self-help smoking cessation program. Individual or group therapy. A smoking cessation support group. There are several national programs to help you quit smoking or vaping. These include: Text message programs, such as SmokefreeTXT. Apps for mobile  phones, including the free quitSTART app. Hotlines, such as 1-800-QUIT-NOW  416-595-4870). Where to find support You can get support at these sites: U.S. Department of Health and Human Services: smokefree.gov American Lung Association: www.lung.org Where to find more information Learn more about e-cigarettes from: [employer] on Drug Abuse: motorcyclefax.com Centers for Disease Control and Prevention: http://www.wolf.info/ Summary E-cigarettes can cause nicotine addiction. E-cigarettes are not approved as a way to stop smoking. They are not a risk-free alternative to smoking tobacco. There are reports of an increasing number of cases involving serious lung problems, and even death, associated with e-cigarette use. If you can stop vaping on your own, do it before you become addicted to nicotine. If you need help quitting, ask your health care provider. There are various methods and programs that can help you stop smoking or vaping. This information is not intended to replace advice given to you by your health care provider. Make sure you discuss any questions you have with your health care provider. Document Revised: 07/01/2021 Document Reviewed: 07/01/2021 Elsevier Patient Education  [place].

## 2022-03-19 ENCOUNTER — Other Ambulatory Visit: Payer: Self-pay | Admitting: Nurse Practitioner

## 2022-03-19 DIAGNOSIS — D509 Iron deficiency anemia, unspecified: Secondary | ICD-10-CM

## 2022-03-19 DIAGNOSIS — J449 Chronic obstructive pulmonary disease, unspecified: Secondary | ICD-10-CM

## 2022-03-20 LAB — IRON,TIBC AND FERRITIN PANEL
%SAT: 3 % (calc) — ABNORMAL LOW (ref 16–45)
Ferritin: 4 ng/mL — ABNORMAL LOW (ref 16–288)
Iron: 13 ug/dL — ABNORMAL LOW (ref 45–160)
TIBC: 455 mcg/dL (calc) — ABNORMAL HIGH (ref 250–450)

## 2022-03-20 LAB — MICROALBUMIN / CREATININE URINE RATIO
Creatinine, Urine: 34 mg/dL (ref 20–275)
Microalb Creat Ratio: 50 mcg/mg creat — ABNORMAL HIGH (ref <30)
Microalb, Ur: 1.7 mg/dL

## 2022-03-20 LAB — CBC WITH DIFFERENTIAL/PLATELET
Absolute Monocytes: 442 cells/uL (ref 200–950)
Basophils Absolute: 42 cells/uL (ref 0–200)
Basophils Relative: 0.8 %
Eosinophils Absolute: 52 cells/uL (ref 15–500)
Eosinophils Relative: 1 %
HCT: 29.9 % — ABNORMAL LOW (ref 35.0–45.0)
Hemoglobin: 9 g/dL — ABNORMAL LOW (ref 11.7–15.5)
Lymphs Abs: 1342 cells/uL (ref 850–3900)
MCH: 22.8 pg — ABNORMAL LOW (ref 27.0–33.0)
MCHC: 30.1 g/dL — ABNORMAL LOW (ref 32.0–36.0)
MCV: 75.9 fL — ABNORMAL LOW (ref 80.0–100.0)
MPV: 9.8 fL (ref 7.5–12.5)
Monocytes Relative: 8.5 %
Neutro Abs: 3323 cells/uL (ref 1500–7800)
Neutrophils Relative %: 63.9 %
Platelets: 357 10*3/uL (ref 140–400)
RBC: 3.94 10*6/uL (ref 3.80–5.10)
RDW: 17.9 % — ABNORMAL HIGH (ref 11.0–15.0)
Total Lymphocyte: 25.8 %
WBC: 5.2 10*3/uL (ref 3.8–10.8)

## 2022-03-20 LAB — COMPLETE METABOLIC PANEL WITH GFR
AG Ratio: 1.4 (calc) (ref 1.0–2.5)
ALT: 4 U/L — ABNORMAL LOW (ref 6–29)
AST: 13 U/L (ref 10–35)
Albumin: 3.9 g/dL (ref 3.6–5.1)
Alkaline phosphatase (APISO): 89 U/L (ref 37–153)
BUN: 7 mg/dL (ref 7–25)
CO2: 20 mmol/L (ref 20–32)
Calcium: 8.9 mg/dL (ref 8.6–10.4)
Chloride: 112 mmol/L — ABNORMAL HIGH (ref 98–110)
Creat: 0.78 mg/dL (ref 0.50–1.05)
Globulin: 2.8 g/dL (calc) (ref 1.9–3.7)
Glucose, Bld: 81 mg/dL (ref 65–99)
Potassium: 4.3 mmol/L (ref 3.5–5.3)
Sodium: 143 mmol/L (ref 135–146)
Total Bilirubin: 0.1 mg/dL — ABNORMAL LOW (ref 0.2–1.2)
Total Protein: 6.7 g/dL (ref 6.1–8.1)
eGFR: 85 mL/min/{1.73_m2} (ref >=60)

## 2022-03-20 LAB — URINALYSIS, ROUTINE W REFLEX MICROSCOPIC
Bilirubin Urine: NEGATIVE
Glucose, UA: NEGATIVE
Hgb urine dipstick: NEGATIVE
Ketones, ur: NEGATIVE
Leukocytes,Ua: NEGATIVE
Nitrite: NEGATIVE
Protein, ur: NEGATIVE
Specific Gravity, Urine: 1.009 (ref 1.001–1.035)
pH: 5.5 (ref 5.0–8.0)

## 2022-03-20 LAB — TEST AUTHORIZATION

## 2022-03-20 LAB — TSH: TSH: 1.6 mIU/L (ref 0.40–4.50)

## 2022-03-20 LAB — LIPID PANEL
Cholesterol: 176 mg/dL (ref <200)
HDL: 48 mg/dL — ABNORMAL LOW (ref >=50)
LDL Cholesterol (Calc): 101 mg/dL (calc) — ABNORMAL HIGH
Non-HDL Cholesterol (Calc): 128 mg/dL (calc) (ref <130)
Total CHOL/HDL Ratio: 3.7 (calc) (ref <5.0)
Triglycerides: 174 mg/dL — ABNORMAL HIGH (ref <150)

## 2022-03-20 LAB — HEMOGLOBIN A1C
Hgb A1c MFr Bld: 5 % of total Hgb (ref <5.7)
Mean Plasma Glucose: 97 mg/dL
eAG (mmol/L): 5.4 mmol/L

## 2022-03-20 LAB — MAGNESIUM: Magnesium: 1.9 mg/dL (ref 1.5–2.5)

## 2022-03-20 LAB — VITAMIN D 25 HYDROXY (VIT D DEFICIENCY, FRACTURES): Vit D, 25-Hydroxy: 88 ng/mL (ref 30–100)

## 2022-03-24 ENCOUNTER — Telehealth: Payer: Self-pay | Admitting: Nurse Practitioner

## 2022-03-24 NOTE — Telephone Encounter (Signed)
FOR DOCUMENTATION ONLY: Pt's daughter called back to make an appointment for her mom for a follow up per Hinton Dyer. She also let it be known that she would not be able to take her mom to get a Chest Xray anytime before 06/15 because she wanted to take her to all of her appointments on the same day. Her daughter is apparently caregiver to the patient and the pt's mother. I tired to explain the urgency of the chest xray based on Dana's ASAP message in lab results and the daughter expressed that she understood but this was something her mother had been dealing with for some time and did not see much urgency considering its been ongoing and she is feeling better since taking her iron medications. I still advised her that sooner rather than later would be ideal

## 2022-04-01 ENCOUNTER — Ambulatory Visit
Admission: RE | Admit: 2022-04-01 | Discharge: 2022-04-01 | Disposition: A | Discharge status: Home / Self Care | Payer: Medicare Other | Source: Ambulatory Visit | Attending: Nurse Practitioner | Admitting: Nurse Practitioner

## 2022-04-01 ENCOUNTER — Ambulatory Visit (INDEPENDENT_AMBULATORY_CARE_PROVIDER_SITE_OTHER): Payer: Medicare Other

## 2022-04-01 ENCOUNTER — Encounter: Payer: Self-pay | Admitting: Hematology and Oncology

## 2022-04-01 DIAGNOSIS — J449 Chronic obstructive pulmonary disease, unspecified: Secondary | ICD-10-CM | POA: Diagnosis not present

## 2022-04-01 DIAGNOSIS — R918 Other nonspecific abnormal finding of lung field: Secondary | ICD-10-CM | POA: Diagnosis not present

## 2022-04-01 DIAGNOSIS — D509 Iron deficiency anemia, unspecified: Secondary | ICD-10-CM | POA: Diagnosis not present

## 2022-04-01 LAB — CBC WITH DIFFERENTIAL/PLATELET
Absolute Monocytes: 483 cells/uL (ref 200–950)
Basophils Absolute: 32 cells/uL (ref 0–200)
Basophils Relative: 0.7 %
Eosinophils Absolute: 60 cells/uL (ref 15–500)
Eosinophils Relative: 1.3 %
HCT: 34.3 % — ABNORMAL LOW (ref 35.0–45.0)
Hemoglobin: 10.1 g/dL — ABNORMAL LOW (ref 11.7–15.5)
Lymphs Abs: 1504 cells/uL (ref 850–3900)
MCH: 23.9 pg — ABNORMAL LOW (ref 27.0–33.0)
MCHC: 29.4 g/dL — ABNORMAL LOW (ref 32.0–36.0)
MCV: 81.1 fL (ref 80.0–100.0)
MPV: 10.3 fL (ref 7.5–12.5)
Monocytes Relative: 10.5 %
Neutro Abs: 2521 cells/uL (ref 1500–7800)
Neutrophils Relative %: 54.8 %
Platelets: 297 10*3/uL (ref 140–400)
RBC: 4.23 10*6/uL (ref 3.80–5.10)
RDW: 23.7 % — ABNORMAL HIGH (ref 11.0–15.0)
Total Lymphocyte: 32.7 %
WBC: 4.6 10*3/uL (ref 3.8–10.8)

## 2022-04-01 LAB — CBC MORPHOLOGY

## 2022-04-01 NOTE — Progress Notes (Signed)
The patient came in for repeat CBC. She had no complaints at today's visit.

## 2022-04-27 DIAGNOSIS — M25511 Pain in right shoulder: Secondary | ICD-10-CM | POA: Diagnosis not present

## 2023-02-19 ENCOUNTER — Encounter: Payer: Self-pay | Admitting: Hematology and Oncology

## 2023-03-21 ENCOUNTER — Ambulatory Visit: Payer: Medicare Other | Admitting: Nurse Practitioner

## 2023-03-21 NOTE — Progress Notes (Deleted)
Medicare Annual Wellness Visit  Assessment and Plan:  Encounter for medicare annual wellness visit Due annually Health maintenance reviewed. Healthy lifestyle goals set  Mixed hyperlipidemia/Statin Intolerance Zetia and statins cause leg pain.  Discussed lifestyle modifications. Recommended diet heavy in fruits and veggies, omega 3's. Decrease consumption of animal meats, cheeses, and dairy products. Remain active and exercise as tolerated. Continue to monitor. Check lipids/TSH  Chronic obstructive pulmonary disease, unspecified COPD type (HCC) No triggers, well controlled symptoms, cont to monitor, continue medications-advair, duo neb, albuterol  Essential hypertension Discussed DASH (Dietary Approaches to Stop Hypertension) DASH diet is lower in sodium than a typical American diet. Cut back on foods that are high in saturated fat, cholesterol, and trans fats. Eat more whole-grain foods, fish, poultry, and nuts Remain active and exercise as tolerated daily.  Monitor BP at home-Call if greater than 130/80.  Check CMP/CBC  Depression, major, in remission (HCC) Continue medications, stress management techniques discussed, increase water, good sleep hygiene discussed, increase exercise, and increase veggies.   Protein-calorie malnutrition, moderate (HCC) Continue protein supplement Monitor weight  Iron deficiency anemia secondary to inadequate dietary iron intake Monitor CBC/anemia profile   Vitamin D deficiency Continue supplement for goal of 60-100 Monitor Vitamin D levels  Asymptomatic stenosis of right carotid artery S/P CEA  Asthma-COPD overlap syndrome (HCC) Continue inhalers Avoid triggers Continue to monitor  Thoracic aorta atherosclerosis (HCC) Control blood pressure, cholesterol, glucose, increase exercise.   Medication management All medications discussed and reviewed in full. All questions and concerns regarding medications addressed.     Future  Appointments  Date Time Provider Department Center  03/21/2023 11:00 AM Adela Glimpse, NP [facility] None    HPI  67 y.o. female  presents for a complete physical.  Her blood pressure has been controlled at home, today their BP is    BP Readings from Last 3 Encounters:  04/01/22 136/70  03/18/22 124/68  10/24/19 118/76   BMI is There is no height or weight on file to calculate BMI., she has been working on diet and exercise. Wt Readings from Last 3 Encounters:  04/01/22 111 lb (50.3 kg)  03/18/22 115 lb 6.4 oz (52.3 kg)  10/24/19 124 lb (56.2 kg)      She has COPD- is currently on Advair, albuterol She has history of GI bleed, states some of the pain got better after stopping the decadron, she is still on prilosec 20 mg BID. She has not black stool or blood in stool.   EGD 2017- showed gastric ulcers, is on PPI BID.  last CXR 08/2018 Echo 2018, s/p right CEA, last carotid US 2018, CTA chest 08/2016.  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 Louanna Raw, last visit 2021   She has COPD and is on Advair/albuterol.  Quit smoking 2 years in Nov.   She does not workout. She denies chest pain, shortness of breath, dizziness.   s/p right CEA due to syncopal episode in 2016 with Dr. Arbie Cookey. She is on bASA.   She had DEXA 09/2015. Has history of cervical compression fractures. She has had the MGM and DEXA rescheduled due to the pandemic. Off fosamax due to GI bleed/GERD.  She is on cholesterol medication, ZETIA EVERY OTHER DAY but has intolerance of statins, NEVER STARTED THE NEXLIZET GIVEN.  Her cholesterol is not at goal of less than 70. The cholesterol last visit was:   Lab Results  Component Value Date   CHOL  176 03/18/2022   HDL 48 (L) 03/18/2022   LDLCALC 101 (H) 03/18/2022   TRIG 174 (H) 03/18/2022   CHOLHDL 3.7 03/18/2022   Last Z6X in the office was:  Lab Results  Component Value Date   HGBA1C 5.0 03/18/2022   Patient is on  Vitamin D supplement.   Lab Results  Component Value Date   VD25OH 88 03/18/2022      Current Medications:  Current Outpatient Medications on File Prior to Visit  Medication Sig Dispense Refill   albuterol (PROVENTIL) (2.5 MG/3ML) 0.083% nebulizer solution Take 3 mLs (2.5 mg total) by nebulization every 6 (six) hours as needed for wheezing or shortness of breath. 75 mL 12   albuterol (VENTOLIN HFA) 108 (90 Base) MCG/ACT inhaler 2 Inhalations 15 to 20  minutes apart every 4 hours to Rescue Asthma 18 g 3   Ascorbic Acid (VITAMIN C) 1000 MG tablet Take 1,000 mg by mouth daily. Takes 7,000mg      aspirin EC 81 MG tablet Take 1 tablet (81 mg total) by mouth daily. 30 tablet 0   Cholecalciferol (VITAMIN D) 50 MCG (2000 UT) CAPS Take by mouth. Takes 6,000 units     ezetimibe (ZETIA) 10 MG tablet Take 1 tablet (10 mg total) by mouth daily. 30 tablet 11   fluticasone-salmeterol (ADVAIR DISKUS) 250-50 MCG/ACT AEPB Inhale 1 puff into the lungs in the morning and at bedtime. 60 each 1   ipratropium-albuterol (DUONEB) 0.5-2.5 (3) MG/3ML SOLN Take 3 mLs by nebulization every 4 (four) hours as needed. Max:6 doses per day 540 mL 0   IRON, FERROUS SULFATE, PO Take 65 mg by mouth 2 (two) times daily.     omeprazole (PRILOSEC) 20 MG capsule Take  1 capsule  2 x /day  To Prevent  Indigestion & Heartburn            /TAKE 1 CAPSULE 2 X /DAY FOR INDIGESTION & HEARBURN 180 capsule 3   zinc gluconate 50 MG tablet Take 50 mg by mouth daily.     No current facility-administered medications on file prior to visit.   Health Maintenance:   Immunization History  Administered Date(s) Administered   Pneumococcal Polysaccharide-23 12/18/2014   Pneumococcal-Unspecified 05/18/2001   Td 12/16/2004   Tdap 01/02/2015   Tetanus: 2016 Pneumovax: 2016 Prevnar 13: due when 67 Flu vaccine: declines Zostavax: N/A  LMP: s/p hysterectomy Pap: Dr. Tenny Craw MGM: 12/2012  OVER DUE DEXA: 09/2015 osteoporisis , on fosamax x 2014  for 4 years- needs REPEAT- overdue Colonoscopy: 12/2015 EGD: 2017 CT chest 11/2014 CXR 2019 MRA brain- 08/19/2016 Myocardial perfusion- 2009 Echo: 2017 Carotid US 11/2014 AB [place] 08/2014  Last Dental Exam: None, [facility] Last Eye Exam: Dr. Louanna Raw  Patient Care Team: [name], MD as PCP - General (Internal Medicine) Hilarie Fredrickson, MD as Consulting Physician (Gastroenterology) Miguel Aschoff, MD (Inactive) as Consulting Physician (Obstetrics and Gynecology)  Dr. Arbie Cookey  Allergies:  Allergies  Allergen Reactions   Decadron [Dexamethasone] Other (See Comments)    Stomach issues, history of GI bleed   Celebrex [Celecoxib] Other (See Comments)    GI bleed   Crestor [Rosuvastatin] Other (See Comments)    Made legs hurt   Doxycycline Other (See Comments)    Stomach upset    Penicillins Other (See Comments)    Has patient had a PCN reaction causing immediate rash, facial/tongue/throat swelling, SOB or lightheadedness with hypotension: Yes Has patient had a PCN reaction causing severe rash involving mucus membranes  or skin necrosis: No Has patient had a PCN reaction that required hospitalization: No Has patient had a PCN reaction occurring within the last 10 years: Yes If all of the above answers are "NO", then may proceed with Cephalosporin use.   REACTION: whelps   Pravastatin Other (See Comments)    Muscle aches   Hydrocodone-Acetaminophen Hives, Itching and Rash   Medical History:  Past Medical History:  Diagnosis Date   Allergy    Anemia    Arthritis    Asthma    Blood transfusion without reported diagnosis    Cataract    COPD (chronic obstructive pulmonary disease) (HCC)    Duodenal ulcer    Emphysema of lung (HCC)    Family history of malignant neoplasm of gastrointestinal tract    GERD (gastroesophageal reflux disease)    Hepatitis    had in 6th grade pt unsure which kind   Hiatal hernia    HTN (hypertension) 10/03/2014   Hx of colonic polyps     Hyperlipemia    Internal hemorrhoids    Osteoporosis    Peripheral vascular disease (HCC)    Pneumonia    hx of   Shortness of breath dyspnea    with ambulation   Stroke (HCC)    Vitamin D deficiency    SURGICAL HISTORY She  has a past surgical history that includes Abdominal hysterectomy; Appendectomy; Total hip arthroplasty (Right); Lumbar disc surgery; Elbow surgery (Right); Wrist surgery (Left); Hand surgery (Left); Esophagogastroduodenoscopy (10/2008); Colonoscopy; Upper gastrointestinal endoscopy; trigeminal neuralgia surgery; Endarterectomy (Right, 12/16/2014); and Cataract extraction, bilateral. FAMILY HISTORY Her family history includes AAA (abdominal aortic aneurysm) in her brother and mother; Colon cancer in an other family member; Colon polyps in her mother; Diabetes in her maternal grandfather; Heart attack in her brother, father, and mother; Heart disease in her brother, father, maternal grandfather, maternal grandmother, and mother; Hyperlipidemia in her brother, father, and mother; Hypertension in her brother, father, and mother; Inflammatory bowel disease in her mother; Stroke in her mother; Varicose Veins in her daughter and mother. SOCIAL HISTORY She  reports that she quit smoking about 5 years ago. Her smoking use included cigarettes. She has a 9.00 pack-year smoking history. She has never used smokeless tobacco. She reports that she does not currently use alcohol. She reports that she does not use drugs.  Review of Systems: ROS  Physical Exam: Estimated body mass index is 20.3 kg/m as calculated from the following:   Height as of 12/27/18: 5\' 2"  (1.575 m).   Weight as of 04/01/22: 111 lb (50.3 kg). There were no vitals taken for this visit. General Appearance: Thin appearing, in no apparent distress.  Eyes: PERRLA, EOMs, conjunctiva no swelling or erythema Sinuses: No Frontal/maxillary tenderness  ENT/Mouth: Ext aud canals clear, normal light reflex with TMs without  erythema, bulging.  No erythema, swelling, or exudate on post pharynx. Tonsils not swollen or erythematous. Hearing normal.  Neck: Supple, thyroid normal. No bruits  Respiratory: Respiratory effort normal, diffuse decreased breath sounds without rhonchi/wheezing   Cardio: RRR without murmurs, rubs or gallops. 1+ TP/DP pulses bilaterally without edema.  Chest: symmetric, with normal excursions and percussion.  Breasts: declines.  Abdomen: Soft, nontender, no guarding, rebound, hernias, masses, or organomegaly. .  Lymphatics: Non tender without lymphadenopathy.  Genitourinary: declines Musculoskeletal: Full ROM all peripheral extremities,5/5 strength, and normal gait.  Skin:Warm, dry without rashes, lesions, ecchymosis. Neuro: Cranial nerves intact, reflexes equal bilaterally. Normal muscle tone, no cerebellar symptoms.Sensation intact.  Psych:  Awake and oriented X 3, normal affect, Insight and Judgment appropriate.     [name] 9:09 AM [facility]

## 2023-03-30 ENCOUNTER — Other Ambulatory Visit: Payer: Self-pay | Admitting: Nurse Practitioner

## 2023-03-30 DIAGNOSIS — K219 Gastro-esophageal reflux disease without esophagitis: Secondary | ICD-10-CM

## 2023-04-26 ENCOUNTER — Other Ambulatory Visit: Payer: Self-pay | Admitting: Nurse Practitioner

## 2023-04-26 DIAGNOSIS — K219 Gastro-esophageal reflux disease without esophagitis: Secondary | ICD-10-CM

## 2023-05-09 NOTE — Progress Notes (Deleted)
Medicare Annual Wellness Visit  Assessment and Plan:  Encounter for medicare annual wellness visit 1 year GET [name] AND DEXA  Get CXR   Mixed hyperlipidemia/Statin Intolerance -     Lipid Profile -  TSH -  Has not been seen x several years, is currently not on medication. Zetia and statins cause leg pain.  -  Will try Zetia every other day  Abnormal Glucose Continue diet and exercise - A1c  Chronic obstructive pulmonary disease, unspecified COPD type (HCC) No triggers, well controlled symptoms, cont to monitor, continue medications-advair, duo neb, albuterol - CXR ordered  Essential hypertension - continue medications, DASH diet, exercise and monitor at home. Call if greater than 130/80.  -     CBC with Diff -     COMPLETE METABOLIC PANEL WITH GFR -     TSH -     Urinalysis, Routine w reflex microscopic -     Microalbumin / Creatinine Urine Ratio  Depression, major, in remission (HCC) - continue medications, stress management techniques discussed, increase water, good sleep hygiene discussed, increase exercise, and increase veggies.   Protein-calorie malnutrition, moderate (HCC) CONTINUE ENSURE  Iron deficiency anemia secondary to inadequate dietary iron intake -CBC  Vitamin D deficiency -     Vitamin D (25 hydroxy)  Asymptomatic stenosis of right carotid artery S/P CEA  Asthma-COPD overlap syndrome (HCC) CONTINUE ADVAIR, SHE IS NOT SMOKING  Thoracic aorta atherosclerosis (HCC) Control blood pressure, cholesterol, glucose, increase exercise.   Medication management -     Magnesium   Discussed med's effects and SE's. Screening labs and tests as requested with regular follow-up as recommended. Future Appointments  Date Time Provider Department Center  05/10/2023 10:00 AM [name], NP [facility] None  05/10/2024 10:00 AM [name], NP [facility] None    HPI  67 y.o. female  presents for a complete physical.  Her blood pressure has been  controlled at home, today their BP is    BP Readings from Last 3 Encounters:  04/01/22 136/70  03/18/22 124/68  10/24/19 118/76   BMI is There is no height or weight on file to calculate BMI., she has been working on diet and exercise. Wt Readings from Last 3 Encounters:  04/01/22 111 lb (50.3 kg)  03/18/22 115 lb 6.4 oz (52.3 kg)  10/24/19 124 lb (56.2 kg)      She has COPD- is currently on Advair, albuterol She has history of GI bleed, states some of the pain got better after stopping the decadron, she is still on prilosec 20 mg BID. She has not black stool or blood in stool.   EGD 2017- showed gastric ulcers, is on PPI BID.  last CXR 08/2018 Echo 2018, s/p right CEA, last carotid US 2018, CTA chest 08/2016.  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 Louanna Raw, last visit 2021   She has COPD and is on Advair/albuterol.  Quit smoking 2 years in Nov.   She does not workout. She denies chest pain, shortness of breath, dizziness.   s/p right CEA due to syncopal episode in 2016 with Dr. Arbie Cookey. She is on bASA.   She had DEXA 09/2015. Has history of cervical compression fractures. She has had the MGM and DEXA rescheduled due to the pandemic. Off fosamax due to GI bleed/GERD.  She is on cholesterol medication, ZETIA EVERY OTHER DAY but has intolerance of statins, NEVER STARTED THE NEXLIZET GIVEN.  Her cholesterol  is not at goal of less than 70. The cholesterol last visit was:   Lab Results  Component Value Date   CHOL 176 03/18/2022   HDL 48 (L) 03/18/2022   LDLCALC 101 (H) 03/18/2022   TRIG 174 (H) 03/18/2022   CHOLHDL 3.7 03/18/2022   Last Z6X in the office was:  Lab Results  Component Value Date   HGBA1C 5.0 03/18/2022   Patient is on Vitamin D supplement.   Lab Results  Component Value Date   VD25OH 88 03/18/2022      Current Medications:  Current Outpatient Medications on File Prior to Visit  Medication Sig Dispense Refill    albuterol (PROVENTIL) (2.5 MG/3ML) 0.083% nebulizer solution Take 3 mLs (2.5 mg total) by nebulization every 6 (six) hours as needed for wheezing or shortness of breath. 75 mL 12   albuterol (VENTOLIN HFA) 108 (90 Base) MCG/ACT inhaler 2 Inhalations 15 to 20  minutes apart every 4 hours to Rescue Asthma 18 g 3   Ascorbic Acid (VITAMIN C) 1000 MG tablet Take 1,000 mg by mouth daily. Takes 7,000mg      aspirin EC 81 MG tablet Take 1 tablet (81 mg total) by mouth daily. 30 tablet 0   Cholecalciferol (VITAMIN D) 50 MCG (2000 UT) CAPS Take by mouth. Takes 6,000 units     ezetimibe (ZETIA) 10 MG tablet Take 1 tablet (10 mg total) by mouth daily. 30 tablet 11   fluticasone-salmeterol (ADVAIR DISKUS) 250-50 MCG/ACT AEPB Inhale 1 puff into the lungs in the morning and at bedtime. 60 each 1   ipratropium-albuterol (DUONEB) 0.5-2.5 (3) MG/3ML SOLN Take 3 mLs by nebulization every 4 (four) hours as needed. Max:6 doses per day 540 mL 0   IRON, FERROUS SULFATE, PO Take 65 mg by mouth 2 (two) times daily.     omeprazole (PRILOSEC) 20 MG capsule TAKE 1 CAPSULE BY MOUTH TWICE DAILY TO PREVENT INDIGESTION & HEARTBURN 180 capsule 1   zinc gluconate 50 MG tablet Take 50 mg by mouth daily.     No current facility-administered medications on file prior to visit.   Health Maintenance:   Immunization History  Administered Date(s) Administered   Pneumococcal Polysaccharide-23 12/18/2014   Pneumococcal-Unspecified 05/18/2001   Td 12/16/2004   Tdap 01/02/2015   Tetanus: 2016 Pneumovax: 2016 Prevnar 13: due when 67 Flu vaccine: declines Zostavax: N/A  LMP: s/p hysterectomy Pap: Dr. Tenny Craw MGM: 12/2012  OVER DUE DEXA: 09/2015 osteoporisis , on fosamax x 2014 for 4 years- needs REPEAT- overdue Colonoscopy: 12/2015 EGD: 2017 CT chest 11/2014 CXR 2019 MRA brain- 08/19/2016 Myocardial perfusion- 2009 Echo: 2017 Carotid US 11/2014 AB [place] 08/2014  Last Dental Exam: None, [facility] Last Eye Exam: Dr.  Louanna Raw  Patient Care Team: Brenda Cowboy, MD as PCP - General (Internal Medicine) Brenda Fredrickson, MD as Consulting Physician (Gastroenterology) Miguel Aschoff, MD (Inactive) as Consulting Physician (Obstetrics and Gynecology)  Dr. Arbie Cookey  Allergies:  Allergies  Allergen Reactions   Decadron [Dexamethasone] Other (See Comments)    Stomach issues, history of GI bleed   Celebrex [Celecoxib] Other (See Comments)    GI bleed   Crestor [Rosuvastatin] Other (See Comments)    Made legs hurt   Doxycycline Other (See Comments)    Stomach upset    Penicillins Other (See Comments)    Has patient had a PCN reaction causing immediate rash, facial/tongue/throat swelling, SOB or lightheadedness with hypotension: Yes Has patient had a PCN reaction causing severe rash involving mucus membranes  or skin necrosis: No Has patient had a PCN reaction that required hospitalization: No Has patient had a PCN reaction occurring within the last 10 years: Yes If all of the above answers are "NO", then may proceed with Cephalosporin use.   REACTION: whelps   Pravastatin Other (See Comments)    Muscle aches   Hydrocodone-Acetaminophen Hives, Itching and Rash   Medical History:  Past Medical History:  Diagnosis Date   Allergy    Anemia    Arthritis    Asthma    Blood transfusion without reported diagnosis    Cataract    COPD (chronic obstructive pulmonary disease) (HCC)    Duodenal ulcer    Emphysema of lung (HCC)    Family history of malignant neoplasm of gastrointestinal tract    GERD (gastroesophageal reflux disease)    Hepatitis    had in 6th grade pt unsure which kind   Hiatal hernia    HTN (hypertension) 10/03/2014   Hx of colonic polyps    Hyperlipemia    Internal hemorrhoids    Osteoporosis    Peripheral vascular disease (HCC)    Pneumonia    hx of   Shortness of breath dyspnea    with ambulation   Stroke (HCC)    Vitamin D deficiency    SURGICAL HISTORY She  has a past surgical  history that includes Abdominal hysterectomy; Appendectomy; Total hip arthroplasty (Right); Lumbar disc surgery; Elbow surgery (Right); Wrist surgery (Left); Hand surgery (Left); Esophagogastroduodenoscopy (10/2008); Colonoscopy; Upper gastrointestinal endoscopy; trigeminal neuralgia surgery; Endarterectomy (Right, 12/16/2014); and Cataract extraction, bilateral. FAMILY HISTORY Her family history includes AAA (abdominal aortic aneurysm) in her brother and mother; Colon cancer in an other family member; Colon polyps in her mother; Diabetes in her maternal grandfather; Heart attack in her brother, father, and mother; Heart disease in her brother, father, maternal grandfather, maternal grandmother, and mother; Hyperlipidemia in her brother, father, and mother; Hypertension in her brother, father, and mother; Inflammatory bowel disease in her mother; Stroke in her mother; Varicose Veins in her daughter and mother. SOCIAL HISTORY She  reports that she quit smoking about 5 years ago. Her smoking use included cigarettes. She started smoking about 41 years ago. She has a 9 pack-year smoking history. She has never used smokeless tobacco. She reports that she does not currently use alcohol. She reports that she does not use drugs.  Review of Systems: Review of Systems  Constitutional:  Negative for chills, fever and weight loss.  HENT:  Negative for congestion and hearing loss.   Eyes:  Negative for blurred vision and double vision.  Respiratory:  Negative for cough and shortness of breath.   Cardiovascular:  Negative for chest pain, palpitations, orthopnea and leg swelling.  Gastrointestinal:  Negative for abdominal pain, constipation, diarrhea, heartburn, nausea and vomiting.  Musculoskeletal:  Negative for falls, joint pain and myalgias.  Skin:  Negative for rash.  Neurological:  Negative for dizziness, tingling, tremors, loss of consciousness and headaches.  Psychiatric/Behavioral:  Negative for depression,  memory loss and suicidal ideas.     Physical Exam: Estimated body mass index is 20.3 kg/m as calculated from the following:   Height as of 12/27/18: 5\' 2"  (1.575 m).   Weight as of 04/01/22: 111 lb (50.3 kg). There were no vitals taken for this visit. General Appearance: Thin appearing, in no apparent distress.  Eyes: PERRLA, EOMs, conjunctiva no swelling or erythema Sinuses: No Frontal/maxillary tenderness  ENT/Mouth: Ext aud canals clear, normal light reflex with  TMs without erythema, bulging.  No erythema, swelling, or exudate on post pharynx. Tonsils not swollen or erythematous. Hearing normal.  Neck: Supple, thyroid normal. No bruits  Respiratory: Respiratory effort normal, diffuse decreased breath sounds without rhonchi/wheezing   Cardio: RRR without murmurs, rubs or gallops. 1+ TP/DP pulses bilaterally without edema.  Chest: symmetric, with normal excursions and percussion.  Breasts: declines.  Abdomen: Soft, nontender, no guarding, rebound, hernias, masses, or organomegaly. .  Lymphatics: Non tender without lymphadenopathy.  Genitourinary: declines Musculoskeletal: Full ROM all peripheral extremities,5/5 strength, and normal gait.  Skin:Warm, dry without rashes, lesions, ecchymosis. Neuro: Cranial nerves intact, reflexes equal bilaterally. Normal muscle tone, no cerebellar symptoms.Sensation intact.  Psych: Awake and oriented X 3, normal affect, Insight and Judgment appropriate.      E  9:52 AM [facility]

## 2023-05-10 ENCOUNTER — Encounter: Payer: Medicare Other | Admitting: Nurse Practitioner

## 2023-05-10 NOTE — Progress Notes (Unsigned)
CPE  Assessment and Plan:  CPE Due annually  Health maintenance reviewed Healthily lifestyle goals set  Mixed hyperlipidemia/Statin Intolerance Discussed lifestyle modifications. Recommended diet heavy in fruits and veggies, omega 3's. Decrease consumption of animal meats, cheeses, and dairy products. Remain active and exercise as tolerated. Continue to monitor. Check lipids/TSH   Chronic obstructive pulmonary disease, unspecified COPD type (HCC) No triggers, well controlled symptoms, cont to monitor, continue medications-advair, duo neb, albuterol  Essential hypertension Discussed DASH (Dietary Approaches to Stop Hypertension) DASH diet is lower in sodium than a typical American diet. Cut back on foods that are high in saturated fat, cholesterol, and trans fats. Eat more whole-grain foods, fish, poultry, and nuts Remain active and exercise as tolerated daily.  Monitor BP at home-Call if greater than 130/80.  Check CMP/CBC  Depression, major, in remission (HCC) Continue medications, stress management techniques discussed, increase water, good sleep hygiene discussed, increase exercise, and increase veggies.   Protein-calorie malnutrition, moderate (HCC)/Weight loss Will review blood work for any underlying contributors.   Continue ensure and high caloric diet, suggest milkshakes. Monitor weight  Iron deficiency anemia secondary to inadequate dietary iron intake Monitor CBC and anemia panel PRN Discussed iron rich foods such as green leafy veggies and lean red meat  Vitamin D deficiency Continue supplement for goal of 60-100 Monitor Vitamin D levels  Asymptomatic stenosis of right carotid artery S/P CEA Continue to monitor  Asthma-COPD overlap syndrome (HCC)/Smoker Due for lung cancer screening - Chest CT ordered Continue inhalers Smoking cessation instruction/counseling given:  counseled patient on the dangers of tobacco use, advised patient to stop smoking, and  reviewed strategies to maximize success   Thoracic aorta atherosclerosis (HCC) Control blood pressure, cholesterol, glucose, increase exercise.   Medication management All medications discussed and reviewed in full. All questions and concerns regarding medications addressed.    Screening for DM Monitor Insulin/DM  Screening for hematuria or proteinuria Monitor UA/Microalbumin  Right side cheek skin change Patient refuses further intervention at this time Discussed dangers of skin cancer Continue to monitor  Orders Placed This Encounter  Procedures   DG Bone Density    Standing Status:   Future    Standing Expiration Date:   05/10/2024    Order Specific Question:   Reason for Exam (SYMPTOM  OR DIAGNOSIS REQUIRED)    Answer:   Osteoporosis    Order Specific Question:   Preferred imaging location?    Answer:   [facility]   CT CHEST LUNG CA SCREEN LOW DOSE W/O CM    Standing Status:   Future    Standing Expiration Date:   05/10/2024    Order Specific Question:   Preferred Imaging Location?    Answer:   [facility]   CBC with Differential/Platelet   COMPLETE METABOLIC PANEL WITH GFR   Lipid panel   Hemoglobin A1C w/out eAG   Insulin, random   VITAMIN D 25 Hydroxy (Vit-D Deficiency, Fractures)   Magnesium   TSH   Urinalysis, Routine w reflex microscopic   Microalbumin / creatinine urine ratio   EKG 12-Lead   Notify office for further evaluation and treatment, questions or concerns if any reported s/s fail to improve.   The patient was advised to call back or seek an in-person evaluation if any symptoms worsen or if the condition fails to improve as anticipated.   Further disposition pending results of labs. Discussed med's effects and SE's.    I discussed the assessment and treatment plan with  the patient. The patient was provided an opportunity to ask questions and all were answered. The patient agreed with the plan and demonstrated an understanding of the  instructions.  Discussed med's effects and SE's. Screening labs and tests as requested with regular follow-up as recommended.  I provided 40 minutes of face-to-face time during this encounter including counseling, chart review, and critical decision making was preformed.  Today's Plan of Care is based on a patient-centered health care approach known as shared decision making - the decisions, tests and treatments allow for patient preferences and values to be balanced with clinical evidence.    Future Appointments  Date Time Provider Department Center  05/10/2024 10:00 AM [name], NP [facility] None    HPI  67 y.o. female  presents for a complete physical.  Overall she reports feeling well today.  She is trying to stay busy working outdoors.  She has an abrasion on the right side of her cheek stating that she keeps scratching and the area comes and goes.  She does not wear sunscreen when outdoors.  She does wear a hat/visor. She does not want to pursue having this evaluated.    Her blood pressure has been controlled at home, today their BP is BP: 134/88  BP Readings from Last 3 Encounters:  05/11/23 134/88  04/01/22 136/70  03/18/22 124/68   BMI is Body mass index is 16.25 kg/m., she has been working on diet and exercise, eating fruits, protein/chicken, beans.  Working out doors more.  Says she does not eat as much during the summer months and remains active outdoors.   Wt Readings from Last 3 Encounters:  05/11/23 87 lb 6.4 oz (39.6 kg)  04/01/22 111 lb (50.3 kg)  03/18/22 115 lb 6.4 oz (52.3 kg)   She is a smoker with COPD/asthma overlap syndrome and does not follow annually with Chest CT.  Weight continues to drop.  She reports current medication, Ventolin and duonebs are effective.  She reports she stopped smoking 2 years ago in November however review of CXR from 04/01/22 reported "smoker."   She has history of GI bleed, states some of the pain got better after stopping  the decadron, she is still on prilosec 20 mg BID. She has not black stool or blood in stool.    EGD 2017- showed gastric ulcers, is on PPI BID.  last CXR 08/2018 Echo 2018, s/p right CEA, last carotid US 2018, CTA chest 08/2016.  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 Louanna Raw, last visit 2021    She does not workout. She denies chest pain, shortness of breath, dizziness.   s/p right CEA due to syncopal episode in 2016 with Dr. Arbie Cookey. She is on bASA.   She had DEXA 09/2015. Has history of cervical compression fractures. She has had the MGM and DEXA rescheduled due to the pandemic. Off fosamax due to GI bleed/GERD.  She is on cholesterol medication, ZETIA EVERY OTHER DAY but has intolerance of statins, NEVER STARTED THE NEXLIZET GIVEN.  Her cholesterol is not at goal of less than 70. The cholesterol last visit was:   Lab Results  Component Value Date   CHOL 176 03/18/2022   HDL 48 (L) 03/18/2022   LDLCALC 101 (H) 03/18/2022   TRIG 174 (H) 03/18/2022   CHOLHDL 3.7 03/18/2022   Last U1L in the office was:  Lab Results  Component Value Date   HGBA1C 5.0 03/18/2022  Patient is on Vitamin D supplement.   Lab Results  Component Value Date   VD25OH 88 03/18/2022    Current Medications:  Current Outpatient Medications on File Prior to Visit  Medication Sig Dispense Refill   albuterol (PROVENTIL) (2.5 MG/3ML) 0.083% nebulizer solution Take 3 mLs (2.5 mg total) by nebulization every 6 (six) hours as needed for wheezing or shortness of breath. 75 mL 12   albuterol (VENTOLIN HFA) 108 (90 Base) MCG/ACT inhaler 2 Inhalations 15 to 20  minutes apart every 4 hours to Rescue Asthma 18 g 3   Ascorbic Acid (VITAMIN C) 1000 MG tablet Take 1,000 mg by mouth daily. Takes 7,000mg      aspirin EC 81 MG tablet Take 1 tablet (81 mg total) by mouth daily. 30 tablet 0   Cholecalciferol (VITAMIN D) 50 MCG (2000 UT) CAPS Take by mouth. Takes 6,000 units      ipratropium-albuterol (DUONEB) 0.5-2.5 (3) MG/3ML SOLN Take 3 mLs by nebulization every 4 (four) hours as needed. Max:6 doses per day 540 mL 0   IRON, FERROUS SULFATE, PO Take 65 mg by mouth 2 (two) times daily.     omeprazole (PRILOSEC) 20 MG capsule TAKE 1 CAPSULE BY MOUTH TWICE DAILY TO PREVENT INDIGESTION & HEARTBURN 180 capsule 1   zinc gluconate 50 MG tablet Take 50 mg by mouth daily.     ezetimibe (ZETIA) 10 MG tablet Take 1 tablet (10 mg total) by mouth daily. 30 tablet 11   fluticasone-salmeterol (ADVAIR DISKUS) 250-50 MCG/ACT AEPB Inhale 1 puff into the lungs in the morning and at bedtime. (Patient not taking: Reported on 05/11/2023) 60 each 1   No current facility-administered medications on file prior to visit.   Health Maintenance:   Immunization History  Administered Date(s) Administered   Pneumococcal Polysaccharide-23 12/18/2014   Pneumococcal-Unspecified 05/18/2001   Td 12/16/2004   Tdap 01/02/2015   Tetanus: 2016 Pneumovax: 2016 Prevnar 13: due when 95 - suggested to obtain at pharmacy - currently not stocked. Flu vaccine: declines Zostavax: N/A  LMP: s/p hysterectomy Pap: Dr. Tenny Craw MGM: 12/2012 - Declines - would not pursue further treatment.  DEXA: 09/2015 osteoporisis , on fosamax x 2014 for 4 years- needs REPEAT- overdue - scheduled Colonoscopy: 12/2015 - declines - would not pursue further treatment EGD: 2017 CT chest 11/2014 CXR 2019 MRA brain- 08/19/2016 Myocardial perfusion- 2009 Echo: 2017 Carotid US 11/2014 AB [place] 08/2014  Last Dental Exam: [facility] - follows PRN Last Eye Exam: Dr. Louanna Raw - follows Q6 mo  Patient Care Team: [name], MD as PCP - General (Internal Medicine) Hilarie Fredrickson, MD as Consulting Physician (Gastroenterology) Miguel Aschoff, MD (Inactive) as Consulting Physician (Obstetrics and Gynecology)  Dr. Arbie Cookey  Allergies:  Allergies  Allergen Reactions   Decadron [Dexamethasone] Other (See Comments)    Stomach  issues, history of GI bleed   Celebrex [Celecoxib] Other (See Comments)    GI bleed   Crestor [Rosuvastatin] Other (See Comments)    Made legs hurt   Doxycycline Other (See Comments)    Stomach upset    Penicillins Other (See Comments)    Has patient had a PCN reaction causing immediate rash, facial/tongue/throat swelling, SOB or lightheadedness with hypotension: Yes Has patient had a PCN reaction causing severe rash involving mucus membranes or skin necrosis: No Has patient had a PCN reaction that required hospitalization: No Has patient had a PCN reaction occurring within the last 10 years: Yes If all of the above answers are "NO",  then may proceed with Cephalosporin use.   REACTION: whelps   Pravastatin Other (See Comments)    Muscle aches   Hydrocodone-Acetaminophen Hives, Itching and Rash   Medical History:  Past Medical History:  Diagnosis Date   Allergy    Anemia    Arthritis    Asthma    Blood transfusion without reported diagnosis    Cataract    COPD (chronic obstructive pulmonary disease) (HCC)    Duodenal ulcer    Emphysema of lung (HCC)    Family history of malignant neoplasm of gastrointestinal tract    GERD (gastroesophageal reflux disease)    Hepatitis    had in 6th grade pt unsure which kind   Hiatal hernia    HTN (hypertension) 10/03/2014   Hx of colonic polyps    Hyperlipemia    Internal hemorrhoids    Osteoporosis    Peripheral vascular disease (HCC)    Pneumonia    hx of   Shortness of breath dyspnea    with ambulation   Stroke (HCC)    Vitamin D deficiency    SURGICAL HISTORY She  has a past surgical history that includes Abdominal hysterectomy; Appendectomy; Total hip arthroplasty (Right); Lumbar disc surgery; Elbow surgery (Right); Wrist surgery (Left); Hand surgery (Left); Esophagogastroduodenoscopy (10/2008); Colonoscopy; Upper gastrointestinal endoscopy; trigeminal neuralgia surgery; Endarterectomy (Right, 12/16/2014); and Cataract  extraction, bilateral. FAMILY HISTORY Her family history includes AAA (abdominal aortic aneurysm) in her brother and mother; Colon cancer in an other family member; Colon polyps in her mother; Diabetes in her maternal grandfather; Heart attack in her brother, father, and mother; Heart disease in her brother, father, maternal grandfather, maternal grandmother, and mother; Hyperlipidemia in her brother, father, and mother; Hypertension in her brother, father, and mother; Inflammatory bowel disease in her mother; Stroke in her mother; Varicose Veins in her daughter and mother. SOCIAL HISTORY She  reports that she quit smoking about 5 years ago. Her smoking use included cigarettes. She started smoking about 41 years ago. She has a 9 pack-year smoking history. She has never used smokeless tobacco. She reports that she does not currently use alcohol. She reports that she does not use drugs.  Review of Systems: ROS  Physical Exam: Estimated body mass index is 16.25 kg/m as calculated from the following:   Height as of this encounter: 5' 1.5" (1.562 m).   Weight as of this encounter: 87 lb 6.4 oz (39.6 kg). BP 134/88   Pulse 96   Temp 98.5 F (36.9 C)   Ht 5' 1.5" (1.562 m)   Wt 87 lb 6.4 oz (39.6 kg)   SpO2 96%   BMI 16.25 kg/m  General Appearance: Thin appearing, in no apparent distress.  Eyes: PERRLA, EOMs, conjunctiva no swelling or erythema Sinuses: No Frontal/maxillary tenderness  ENT/Mouth: Ext aud canals clear, normal light reflex with TMs without erythema, bulging.  No erythema, swelling, or exudate on post pharynx. Tonsils not swollen or erythematous. Hearing normal.  Neck: Supple, thyroid normal. No bruits  Respiratory: Respiratory effort normal, diffuse decreased breath sounds without rhonchi/wheezing   Cardio: RRR without murmurs, rubs or gallops. 1+ TP/DP pulses bilaterally without edema.  Chest: symmetric, with normal excursions and percussion.  Breasts: declines.  Abdomen:  Soft, nontender, no guarding, rebound, hernias, masses, or organomegaly. .  Lymphatics: Non tender without lymphadenopathy.  Genitourinary: declines Musculoskeletal: Full ROM all peripheral extremities,5/5 strength, and normal gait.  Skin: Right cheek with approximately 1 cm round irregularly shaped dry abrasion.  Surrounding skin  WNL. Warm, dry. Neuro: Cranial nerves intact, reflexes equal bilaterally. Normal muscle tone, no cerebellar symptoms.Sensation intact.  Psych: Awake and oriented X 3, normal affect, Insight and Judgment appropriate.     Fiza Nation 9:28 AM [facility]

## 2023-05-11 ENCOUNTER — Encounter: Payer: Self-pay | Admitting: Nurse Practitioner

## 2023-05-11 ENCOUNTER — Ambulatory Visit (INDEPENDENT_AMBULATORY_CARE_PROVIDER_SITE_OTHER): Payer: Medicare Other | Admitting: Nurse Practitioner

## 2023-05-11 VITALS — BP 134/88 | HR 96 | Temp 98.5°F | Ht 61.5 in | Wt 87.4 lb

## 2023-05-11 DIAGNOSIS — E559 Vitamin D deficiency, unspecified: Secondary | ICD-10-CM

## 2023-05-11 DIAGNOSIS — F325 Major depressive disorder, single episode, in full remission: Secondary | ICD-10-CM

## 2023-05-11 DIAGNOSIS — Z131 Encounter for screening for diabetes mellitus: Secondary | ICD-10-CM

## 2023-05-11 DIAGNOSIS — Z79899 Other long term (current) drug therapy: Secondary | ICD-10-CM

## 2023-05-11 DIAGNOSIS — R239 Unspecified skin changes: Secondary | ICD-10-CM

## 2023-05-11 DIAGNOSIS — M810 Age-related osteoporosis without current pathological fracture: Secondary | ICD-10-CM | POA: Diagnosis not present

## 2023-05-11 DIAGNOSIS — F17200 Nicotine dependence, unspecified, uncomplicated: Secondary | ICD-10-CM

## 2023-05-11 DIAGNOSIS — Z789 Other specified health status: Secondary | ICD-10-CM

## 2023-05-11 DIAGNOSIS — E782 Mixed hyperlipidemia: Secondary | ICD-10-CM

## 2023-05-11 DIAGNOSIS — Z136 Encounter for screening for cardiovascular disorders: Secondary | ICD-10-CM

## 2023-05-11 DIAGNOSIS — D509 Iron deficiency anemia, unspecified: Secondary | ICD-10-CM

## 2023-05-11 DIAGNOSIS — E440 Moderate protein-calorie malnutrition: Secondary | ICD-10-CM

## 2023-05-11 DIAGNOSIS — R634 Abnormal weight loss: Secondary | ICD-10-CM

## 2023-05-11 DIAGNOSIS — Z1389 Encounter for screening for other disorder: Secondary | ICD-10-CM

## 2023-05-11 DIAGNOSIS — I10 Essential (primary) hypertension: Secondary | ICD-10-CM

## 2023-05-11 DIAGNOSIS — J441 Chronic obstructive pulmonary disease with (acute) exacerbation: Secondary | ICD-10-CM

## 2023-05-11 DIAGNOSIS — J4489 Other specified chronic obstructive pulmonary disease: Secondary | ICD-10-CM

## 2023-05-11 DIAGNOSIS — Z0000 Encounter for general adult medical examination without abnormal findings: Secondary | ICD-10-CM

## 2023-05-11 DIAGNOSIS — J449 Chronic obstructive pulmonary disease, unspecified: Secondary | ICD-10-CM

## 2023-05-11 DIAGNOSIS — Z0001 Encounter for general adult medical examination with abnormal findings: Secondary | ICD-10-CM

## 2023-05-11 DIAGNOSIS — I700 Atherosclerosis of aorta: Secondary | ICD-10-CM

## 2023-05-11 DIAGNOSIS — I6521 Occlusion and stenosis of right carotid artery: Secondary | ICD-10-CM

## 2023-05-11 DIAGNOSIS — Z1329 Encounter for screening for other suspected endocrine disorder: Secondary | ICD-10-CM

## 2023-05-11 LAB — CBC WITH DIFFERENTIAL/PLATELET
Absolute Monocytes: 733 cells/uL (ref 200–950)
Basophils Absolute: 73 cells/uL (ref 0–200)
Eosinophils Relative: 2.1 %
HCT: 41.5 % (ref 35.0–45.0)
Lymphs Abs: 1571 cells/uL (ref 850–3900)
MCV: 103.2 fL — ABNORMAL HIGH (ref 80.0–100.0)
Monocytes Relative: 11.1 %
Neutro Abs: 4085 cells/uL (ref 1500–7800)
Platelets: 284 10*3/uL (ref 140–400)
RBC: 4.02 10*6/uL (ref 3.80–5.10)
RDW: 14.5 % (ref 11.0–15.0)
WBC: 6.6 10*3/uL (ref 3.8–10.8)

## 2023-05-11 MED ORDER — ALBUTEROL SULFATE (2.5 MG/3ML) 0.083% IN NEBU: 2.5000 mg | INHALATION_SOLUTION | Freq: Four times a day (QID) | RESPIRATORY_TRACT | 12 refills | Status: DC | PRN

## 2023-05-11 MED ORDER — ALBUTEROL SULFATE HFA 108 (90 BASE) MCG/ACT IN AERS: INHALATION_SPRAY | RESPIRATORY_TRACT | 3 refills | Status: DC

## 2023-05-11 MED ORDER — IPRATROPIUM-ALBUTEROL 0.5-2.5 (3) MG/3ML IN SOLN: 3.0000 mL | RESPIRATORY_TRACT | 0 refills | Status: DC | PRN

## 2023-05-11 NOTE — Patient Instructions (Signed)

## 2023-05-12 ENCOUNTER — Encounter: Payer: Self-pay | Admitting: Hematology and Oncology

## 2023-05-12 LAB — LIPID PANEL
Cholesterol: 224 mg/dL — ABNORMAL HIGH (ref <200)
HDL: 65 mg/dL (ref >=50)
LDL Cholesterol (Calc): 123 mg/dL (calc) — ABNORMAL HIGH
Total CHOL/HDL Ratio: 3.4 (calc) (ref <5.0)
Triglycerides: 236 mg/dL — ABNORMAL HIGH (ref <150)

## 2023-05-12 LAB — CBC WITH DIFFERENTIAL/PLATELET
Basophils Relative: 1.1 %
Eosinophils Absolute: 139 cells/uL (ref 15–500)
Hemoglobin: 14 g/dL (ref 11.7–15.5)
MCH: 34.8 pg — ABNORMAL HIGH (ref 27.0–33.0)
MCHC: 33.7 g/dL (ref 32.0–36.0)
MPV: 9.7 fL (ref 7.5–12.5)
Neutrophils Relative %: 61.9 %
Total Lymphocyte: 23.8 %

## 2023-05-12 LAB — URINALYSIS, ROUTINE W REFLEX MICROSCOPIC
Bilirubin Urine: NEGATIVE
Hgb urine dipstick: NEGATIVE
Ketones, ur: NEGATIVE
Leukocytes,Ua: NEGATIVE
Nitrite: NEGATIVE
Protein, ur: NEGATIVE
Specific Gravity, Urine: 1.01 (ref 1.001–1.035)
pH: <=5 (ref 5.0–8.0)

## 2023-05-12 LAB — COMPLETE METABOLIC PANEL WITH GFR
AST: 16 U/L (ref 10–35)
Alkaline phosphatase (APISO): 92 U/L (ref 37–153)
BUN: 14 mg/dL (ref 7–25)
CO2: 26 mmol/L (ref 20–32)
Chloride: 103 mmol/L (ref 98–110)
Creat: 0.7 mg/dL (ref 0.50–1.05)
Globulin: 3.1 g/dL (calc) (ref 1.9–3.7)
Glucose, Bld: 78 mg/dL (ref 65–99)
Potassium: 5.1 mmol/L (ref 3.5–5.3)
Sodium: 139 mmol/L (ref 135–146)
Total Bilirubin: 0.2 mg/dL (ref 0.2–1.2)
Total Protein: 7.5 g/dL (ref 6.1–8.1)
eGFR: 96 mL/min/{1.73_m2} (ref >=60)

## 2023-05-12 LAB — MICROALBUMIN / CREATININE URINE RATIO
Creatinine, Urine: 30 mg/dL (ref 20–275)
Microalb Creat Ratio: 43 mg/g creat — ABNORMAL HIGH (ref <30)
Microalb, Ur: 1.3 mg/dL

## 2023-05-12 LAB — INSULIN, RANDOM: Insulin: 11.2 u[IU]/mL

## 2023-05-12 LAB — TSH: TSH: 6.09 mIU/L — ABNORMAL HIGH (ref 0.40–4.50)

## 2023-05-12 LAB — MAGNESIUM: Magnesium: 2 mg/dL (ref 1.5–2.5)

## 2023-05-18 ENCOUNTER — Telehealth: Payer: Self-pay | Admitting: Nurse Practitioner

## 2023-05-18 NOTE — Telephone Encounter (Signed)
Patient advised.

## 2023-05-19 ENCOUNTER — Telehealth: Payer: Self-pay

## 2023-05-19 NOTE — Telephone Encounter (Signed)
Patient's daughter is very upset and frustrated that her mother's TSH levels are high and has not been started on any meds. Please advise.

## 2023-06-01 ENCOUNTER — Other Ambulatory Visit: Payer: Medicare Other

## 2023-06-08 ENCOUNTER — Ambulatory Visit: Payer: Medicare Other

## 2023-06-09 ENCOUNTER — Ambulatory Visit
Admission: RE | Admit: 2023-06-09 | Discharge: 2023-06-09 | Disposition: A | Discharge status: Home / Self Care | Payer: Medicare Other | Source: Ambulatory Visit | Attending: Nurse Practitioner | Admitting: Nurse Practitioner

## 2023-06-09 ENCOUNTER — Other Ambulatory Visit: Payer: Self-pay | Admitting: Nurse Practitioner

## 2023-06-09 ENCOUNTER — Ambulatory Visit: Payer: Medicare Other

## 2023-06-09 DIAGNOSIS — F17200 Nicotine dependence, unspecified, uncomplicated: Secondary | ICD-10-CM

## 2023-06-09 DIAGNOSIS — R7989 Other specified abnormal findings of blood chemistry: Secondary | ICD-10-CM | POA: Diagnosis not present

## 2023-06-09 DIAGNOSIS — E039 Hypothyroidism, unspecified: Secondary | ICD-10-CM | POA: Diagnosis not present

## 2023-06-09 DIAGNOSIS — E440 Moderate protein-calorie malnutrition: Secondary | ICD-10-CM

## 2023-06-09 DIAGNOSIS — F17210 Nicotine dependence, cigarettes, uncomplicated: Secondary | ICD-10-CM | POA: Diagnosis not present

## 2023-06-09 DIAGNOSIS — J4489 Other specified chronic obstructive pulmonary disease: Secondary | ICD-10-CM

## 2023-06-09 NOTE — Progress Notes (Unsigned)
The patient is here for repeat labs. She reports she is not taking any biotin or biotin containing products nor is she taking any thyroid medications. She reports no new issues or problems at today's nurse visit.

## 2023-06-10 ENCOUNTER — Other Ambulatory Visit: Payer: Medicare Other

## 2023-06-10 LAB — TSH: TSH: 1.18 m[IU]/L (ref 0.40–4.50)

## 2023-06-13 ENCOUNTER — Telehealth: Payer: Self-pay | Admitting: Nurse Practitioner

## 2023-06-13 ENCOUNTER — Other Ambulatory Visit: Payer: Self-pay | Admitting: Nurse Practitioner

## 2023-06-13 DIAGNOSIS — J449 Chronic obstructive pulmonary disease, unspecified: Secondary | ICD-10-CM

## 2023-06-13 MED ORDER — ALBUTEROL SULFATE (2.5 MG/3ML) 0.083% IN NEBU: 2.5000 mg | INHALATION_SOLUTION | Freq: Four times a day (QID) | RESPIRATORY_TRACT | 12 refills | Status: DC | PRN

## 2023-06-13 NOTE — Telephone Encounter (Signed)
Pt needs a new nebulizer with solution.

## 2023-06-14 ENCOUNTER — Other Ambulatory Visit: Payer: Self-pay | Admitting: Nurse Practitioner

## 2023-06-14 ENCOUNTER — Telehealth: Payer: Self-pay | Admitting: Nurse Practitioner

## 2023-06-14 DIAGNOSIS — J4489 Other specified chronic obstructive pulmonary disease: Secondary | ICD-10-CM

## 2023-06-14 DIAGNOSIS — J449 Chronic obstructive pulmonary disease, unspecified: Secondary | ICD-10-CM

## 2023-06-14 DIAGNOSIS — F17200 Nicotine dependence, unspecified, uncomplicated: Secondary | ICD-10-CM

## 2023-06-14 NOTE — Telephone Encounter (Signed)
please result 06/09/23 CT Lung cancer screening

## 2023-06-14 NOTE — Telephone Encounter (Signed)
Ordered Nebulizer from [employer] [facility] via [employer] portal.

## 2023-06-15 ENCOUNTER — Telehealth: Payer: Self-pay | Admitting: Nurse Practitioner

## 2023-06-15 DIAGNOSIS — J449 Chronic obstructive pulmonary disease, unspecified: Secondary | ICD-10-CM | POA: Diagnosis not present

## 2023-06-15 NOTE — Telephone Encounter (Signed)
Pt's daughter would like you to go over CT results b/c pt did not understand them.

## 2023-06-16 ENCOUNTER — Encounter: Payer: Self-pay | Admitting: Nurse Practitioner

## 2023-06-16 DIAGNOSIS — M7989 Other specified soft tissue disorders: Secondary | ICD-10-CM

## 2023-06-22 ENCOUNTER — Other Ambulatory Visit: Payer: Self-pay | Admitting: Nurse Practitioner

## 2023-06-22 DIAGNOSIS — M7989 Other specified soft tissue disorders: Secondary | ICD-10-CM

## 2023-06-23 ENCOUNTER — Encounter: Payer: Self-pay | Admitting: Nurse Practitioner

## 2023-07-08 ENCOUNTER — Ambulatory Visit
Admission: RE | Admit: 2023-07-08 | Discharge: 2023-07-08 | Disposition: A | Discharge status: Home / Self Care | Payer: Medicare Other | Source: Ambulatory Visit | Attending: Nurse Practitioner | Admitting: Nurse Practitioner

## 2023-07-08 ENCOUNTER — Encounter: Payer: Self-pay | Admitting: Hematology and Oncology

## 2023-07-08 DIAGNOSIS — M7989 Other specified soft tissue disorders: Secondary | ICD-10-CM | POA: Diagnosis not present

## 2023-07-08 DIAGNOSIS — J439 Emphysema, unspecified: Secondary | ICD-10-CM | POA: Diagnosis not present

## 2023-07-08 DIAGNOSIS — I700 Atherosclerosis of aorta: Secondary | ICD-10-CM | POA: Diagnosis not present

## 2023-07-08 MED ORDER — IOPAMIDOL (ISOVUE-300) INJECTION 61%
75.0000 mL | Freq: Once | INTRAVENOUS | Status: AC | PRN
  Administered 2023-07-08: 75 mL via INTRAVENOUS

## 2023-07-14 ENCOUNTER — Other Ambulatory Visit: Payer: Self-pay | Admitting: Nurse Practitioner

## 2023-07-14 DIAGNOSIS — F17200 Nicotine dependence, unspecified, uncomplicated: Secondary | ICD-10-CM

## 2023-07-14 DIAGNOSIS — J449 Chronic obstructive pulmonary disease, unspecified: Secondary | ICD-10-CM

## 2023-07-14 DIAGNOSIS — M7989 Other specified soft tissue disorders: Secondary | ICD-10-CM

## 2023-07-14 DIAGNOSIS — R911 Solitary pulmonary nodule: Secondary | ICD-10-CM

## 2023-07-14 DIAGNOSIS — J4489 Other specified chronic obstructive pulmonary disease: Secondary | ICD-10-CM

## 2023-07-15 ENCOUNTER — Other Ambulatory Visit: Payer: Self-pay | Admitting: Nurse Practitioner

## 2023-07-15 ENCOUNTER — Encounter: Payer: Self-pay | Admitting: Nurse Practitioner

## 2023-07-15 DIAGNOSIS — R911 Solitary pulmonary nodule: Secondary | ICD-10-CM

## 2023-07-15 DIAGNOSIS — F17200 Nicotine dependence, unspecified, uncomplicated: Secondary | ICD-10-CM

## 2023-07-15 DIAGNOSIS — M7989 Other specified soft tissue disorders: Secondary | ICD-10-CM

## 2023-07-19 ENCOUNTER — Encounter (HOSPITAL_COMMUNITY)
Admission: RE | Admit: 2023-07-19 | Discharge: 2023-07-19 | Disposition: A | Discharge status: Home / Self Care | Payer: Medicare Other | Source: Ambulatory Visit | Attending: Nurse Practitioner | Admitting: Nurse Practitioner

## 2023-07-19 DIAGNOSIS — R911 Solitary pulmonary nodule: Secondary | ICD-10-CM | POA: Insufficient documentation

## 2023-07-19 DIAGNOSIS — R918 Other nonspecific abnormal finding of lung field: Secondary | ICD-10-CM | POA: Insufficient documentation

## 2023-07-19 DIAGNOSIS — R590 Localized enlarged lymph nodes: Secondary | ICD-10-CM | POA: Insufficient documentation

## 2023-07-19 DIAGNOSIS — M7989 Other specified soft tissue disorders: Secondary | ICD-10-CM | POA: Diagnosis not present

## 2023-07-19 LAB — GLUCOSE, CAPILLARY: Glucose-Capillary: 107 mg/dL — ABNORMAL HIGH (ref 70–99)

## 2023-07-19 MED ORDER — FLUDEOXYGLUCOSE F - 18 (FDG) INJECTION
5.0000 | Freq: Once | INTRAVENOUS | Status: AC
  Administered 2023-07-19: 4.76 via INTRAVENOUS

## 2023-07-20 ENCOUNTER — Ambulatory Visit (INDEPENDENT_AMBULATORY_CARE_PROVIDER_SITE_OTHER): Payer: Medicare Other | Admitting: Emergency Medicine

## 2023-07-20 ENCOUNTER — Encounter: Payer: Self-pay | Admitting: Emergency Medicine

## 2023-07-20 VITALS — BP 129/76 | HR 89 | Ht 61.0 in | Wt 87.2 lb

## 2023-07-20 DIAGNOSIS — Z720 Tobacco use: Secondary | ICD-10-CM | POA: Diagnosis not present

## 2023-07-20 DIAGNOSIS — R918 Other nonspecific abnormal finding of lung field: Secondary | ICD-10-CM | POA: Insufficient documentation

## 2023-07-20 DIAGNOSIS — J449 Chronic obstructive pulmonary disease, unspecified: Secondary | ICD-10-CM | POA: Diagnosis not present

## 2023-07-20 NOTE — Assessment & Plan Note (Signed)
Right Hilar Mass Suspicious for lung cancer based on PET scan findings of an intensely hypermetabolic right hilar mass and a hypermetabolic right paratracheal lymph node. No distant disease noted. -Plan bronchoscopy with interbronchial ultrasound and biopsies of the right hilar mass on 07/25/2023. -Stop aspirin two days prior to procedure.
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
## 2023-07-20 NOTE — Assessment & Plan Note (Signed)
Tobacco Use Long history of smoking, recently restarted after a period of cessation. -Encourage smoking cessation, provide resources if needed.

## 2023-07-20 NOTE — Patient Instructions (Addendum)
VISIT SUMMARY:  During your recent visit, we discussed the findings of a right hilar/nodal mass and a right middle lobe pulmonary nodule on your CT scan. These findings, along with your significant weight loss and history of smoking, raise concerns about possible lung cancer. We also discussed your chronic obstructive pulmonary disease (COPD), which is currently stable, and your other chronic conditions.  YOUR PLAN:  -RIGHT HILAR MASS: This is a suspicious growth in your lung that could be cancerous. We plan to perform a bronchoscopy with ultrasound and biopsies of the mass on July 25, 2023.  This will be done as an outpatient under general anesthesia at [facility] endoscopy.  Please stop taking aspirin two days before the procedure.  You will need a designated driver and someone to watch you at home on that day after the procedure.  -COPD: This is a chronic lung disease that can make it hard to breathe. Your condition is stable, and you should continue your current inhaler medications (Advair, Duoneb, Albuterol).  -UNINTENTIONAL WEIGHT LOSS: You've lost a significant amount of weight over the past year, which could be related to the suspected lung cancer. We will monitor your weight closely and reassess after the bronchoscopy and biopsy results.  -GERD WITH HIATAL HERNIA, HYPERTENSION, PERIPHERAL VASCULAR DISEASE, CVA: These are your other chronic conditions. There were no acute issues discussed during the visit, so you should continue your current management as per your primary care physician.  -TOBACCO USE: You have a long history of smoking, which can increase your risk of lung cancer and other health problems. We encourage you to quit smoking and can provide resources to help you do so.  INSTRUCTIONS:  Please remember to stop taking aspirin two days before your bronchoscopy procedure on July 25, 2023. Continue taking your current medications as prescribed, and try to quit smoking. We will  monitor your weight closely and reassess after the bronchoscopy and biopsy results.  Will try to get you set up for follow-up visit in the next week after the bronchoscopy with T. Parrett, NP

## 2023-07-20 NOTE — Assessment & Plan Note (Signed)
COPD Stable with occasional shortness of breath on exertion. No recent exacerbations. -Continue current inhaler medications (Advair, Duoneb, Albuterol).

## 2023-07-21 ENCOUNTER — Other Ambulatory Visit: Payer: Self-pay

## 2023-07-21 ENCOUNTER — Encounter (HOSPITAL_COMMUNITY): Payer: Self-pay | Admitting: Emergency Medicine

## 2023-07-21 NOTE — Progress Notes (Addendum)
PCP - [name], MD Cardiologist -   PPM/ICD - denies Device Orders -  Rep Notified -   Chest x-ray - CT CHEST 07-14-23 EKG - 05-11-23 Stress Test -  ECHO -2018  Cardiac Cath -   CPAP -   DM-  Blood Thinner Instructions:  Aspirin Instructions: FOLLOW INSTRUCTIONS GIVEN TO YOU BY YOUR SURGEON LAST DOSE 07-23-23  ERAS Protcol - NPO  COVID TEST- n/a  Anesthesia review: no  Patient verbally denies any shortness of breath, fever, cough and chest pain during phone call   -------------  SDW INSTRUCTIONS given:  Your procedure is scheduled on July 25, 2023.Marland Kitchen  Report to [facility] Main Entrance "A" at 10:45 A.M., and check in at the Admitting office.  Call this number if you have problems the morning of surgery:  [phone]   Remember:  Do not eat  or drink after midnight the night before your surgery     Take these medicines the morning of surgery with A SIP OF WATER  ezetimibe (ZETIA)  omeprazole (PRILOSEC)   IF NEEDED albuterol (PROVENTIL)  albuterol (VENTOLIN HFA) MAY BRING WITH YOU TO YOUR APPOINTMENT ipratropium-albuterol (DUONEB)   As of today, STOP taking any Aspirin (unless otherwise instructed by your surgeon) Aleve, Naproxen, Ibuprofen, Motrin, Advil, Goody's, BC's, all herbal medications, fish oil, and all vitamins. PLEASE FOLLOW INSTRUCTIONS GIVEN TO YOU BY SURGEON aspirin EC                       Do not wear jewelry, make up, or nail polish            Do not wear lotions, powders, perfumes/colognes, or deodorant.            Do not shave 48 hours prior to surgery.  Men may shave face and neck.            Do not bring valuables to the hospital.            [facility] is not responsible for any belongings or valuables.  Do NOT Smoke (Tobacco/Vaping) 24 hours prior to your procedure If you use a CPAP at night, you may bring all equipment for your overnight stay.   Contacts, glasses, dentures or bridgework may not be worn into surgery.      For  patients admitted to the hospital, discharge time will be determined by your treatment team.   Patients discharged the day of surgery will not be allowed to drive home, and someone needs to stay with them for 24 hours.    Special instructions:   [facility]- Preparing For Surgery  Before surgery, you can play an important role. Because skin is not sterile, your skin needs to be as free of germs as possible. You can reduce the number of germs on your skin by washing with CHG (chlorahexidine gluconate) Soap before surgery.  CHG is an antiseptic cleaner which kills germs and bonds with the skin to continue killing germs even after washing.    Oral Hygiene is also important to reduce your risk of infection.  Remember - BRUSH YOUR TEETH THE MORNING OF SURGERY WITH YOUR REGULAR TOOTHPASTE  Please do not use if you have an allergy to CHG or antibacterial soaps. If your skin becomes reddened/irritated stop using the CHG.  Do not shave (including legs and underarms) for at least 48 hours prior to first CHG shower. It is OK to shave your face.  Please follow these instructions  carefully.   Shower the NIGHT BEFORE SURGERY and the MORNING OF SURGERY with DIAL Soap.   Pat yourself dry with a CLEAN TOWEL.  Wear CLEAN PAJAMAS to bed the night before surgery  Place CLEAN SHEETS on your bed the night of your first shower and DO NOT SLEEP WITH PETS.   Day of Surgery: Please shower morning of surgery  Wear Clean/Comfortable clothing the morning of surgery Do not apply any deodorants/lotions.   Remember to brush your teeth WITH YOUR REGULAR TOOTHPASTE.   Questions were answered. Patient verbalized understanding of instructions.

## 2023-07-25 ENCOUNTER — Other Ambulatory Visit: Payer: Self-pay | Admitting: Emergency Medicine

## 2023-07-25 ENCOUNTER — Other Ambulatory Visit: Payer: Self-pay

## 2023-07-25 ENCOUNTER — Ambulatory Visit (HOSPITAL_COMMUNITY): Payer: Medicare Other | Admitting: Registered Nurse

## 2023-07-25 ENCOUNTER — Encounter (HOSPITAL_COMMUNITY): Payer: Self-pay | Admitting: Emergency Medicine

## 2023-07-25 ENCOUNTER — Encounter (HOSPITAL_COMMUNITY)
Admission: RE | Disposition: A | Discharge status: Home / Self Care | Payer: Self-pay | Source: Ambulatory Visit | Attending: Emergency Medicine

## 2023-07-25 ENCOUNTER — Ambulatory Visit (HOSPITAL_BASED_OUTPATIENT_CLINIC_OR_DEPARTMENT_OTHER): Payer: Medicare Other | Admitting: Registered Nurse

## 2023-07-25 ENCOUNTER — Ambulatory Visit (HOSPITAL_COMMUNITY)
Admission: RE | Admit: 2023-07-25 | Discharge: 2023-07-25 | Disposition: A | Discharge status: Home / Self Care | Payer: Medicare Other | Source: Ambulatory Visit | Attending: Emergency Medicine | Admitting: Emergency Medicine

## 2023-07-25 DIAGNOSIS — Z8673 Personal history of transient ischemic attack (TIA), and cerebral infarction without residual deficits: Secondary | ICD-10-CM | POA: Diagnosis not present

## 2023-07-25 DIAGNOSIS — I10 Essential (primary) hypertension: Secondary | ICD-10-CM | POA: Insufficient documentation

## 2023-07-25 DIAGNOSIS — C771 Secondary and unspecified malignant neoplasm of intrathoracic lymph nodes: Secondary | ICD-10-CM | POA: Insufficient documentation

## 2023-07-25 DIAGNOSIS — I739 Peripheral vascular disease, unspecified: Secondary | ICD-10-CM | POA: Diagnosis not present

## 2023-07-25 DIAGNOSIS — R918 Other nonspecific abnormal finding of lung field: Secondary | ICD-10-CM | POA: Diagnosis not present

## 2023-07-25 DIAGNOSIS — Z87891 Personal history of nicotine dependence: Secondary | ICD-10-CM | POA: Insufficient documentation

## 2023-07-25 DIAGNOSIS — Z7951 Long term (current) use of inhaled steroids: Secondary | ICD-10-CM | POA: Insufficient documentation

## 2023-07-25 DIAGNOSIS — K449 Diaphragmatic hernia without obstruction or gangrene: Secondary | ICD-10-CM | POA: Insufficient documentation

## 2023-07-25 DIAGNOSIS — J439 Emphysema, unspecified: Secondary | ICD-10-CM | POA: Insufficient documentation

## 2023-07-25 DIAGNOSIS — Z79899 Other long term (current) drug therapy: Secondary | ICD-10-CM | POA: Diagnosis not present

## 2023-07-25 DIAGNOSIS — Z8711 Personal history of peptic ulcer disease: Secondary | ICD-10-CM | POA: Diagnosis not present

## 2023-07-25 DIAGNOSIS — Z7982 Long term (current) use of aspirin: Secondary | ICD-10-CM | POA: Insufficient documentation

## 2023-07-25 DIAGNOSIS — K219 Gastro-esophageal reflux disease without esophagitis: Secondary | ICD-10-CM | POA: Insufficient documentation

## 2023-07-25 HISTORY — PX: VIDEO BRONCHOSCOPY WITH ENDOBRONCHIAL ULTRASOUND: SHX6177

## 2023-07-25 HISTORY — PX: FINE NEEDLE ASPIRATION BIOPSY: CATH118315

## 2023-07-25 LAB — CBC
HCT: 36.5 % (ref 36.0–46.0)
Hemoglobin: 12 g/dL (ref 12.0–15.0)
MCH: 34.2 pg — ABNORMAL HIGH (ref 26.0–34.0)
MCHC: 32.9 g/dL (ref 30.0–36.0)
MCV: 104 fL — ABNORMAL HIGH (ref 80.0–100.0)
Platelets: 189 10*3/uL (ref 150–400)
RBC: 3.51 MIL/uL — ABNORMAL LOW (ref 3.87–5.11)
RDW: 16.6 % — ABNORMAL HIGH (ref 11.5–15.5)
WBC: 3.6 10*3/uL — ABNORMAL LOW (ref 4.0–10.5)
nRBC: 0 % (ref 0.0–0.2)

## 2023-07-25 SURGERY — BRONCHOSCOPY, WITH EBUS
Anesthesia: General

## 2023-07-25 MED ORDER — FENTANYL CITRATE (PF) 100 MCG/2ML IJ SOLN
INTRAMUSCULAR | Status: AC
  Filled 2023-07-25: qty 2

## 2023-07-25 MED ORDER — DEXMEDETOMIDINE HCL IN NACL 80 MCG/20ML IV SOLN
INTRAVENOUS | Status: DC | PRN
Start: 2023-07-25 — End: 2023-07-25
  Administered 2023-07-25 (×2): 4 ug via INTRAVENOUS

## 2023-07-25 MED ORDER — SUGAMMADEX SODIUM 200 MG/2ML IV SOLN
INTRAVENOUS | Status: DC | PRN
  Administered 2023-07-25: 200 mg via INTRAVENOUS

## 2023-07-25 MED ORDER — LIDOCAINE 2% (20 MG/ML) 5 ML SYRINGE
INTRAMUSCULAR | Status: DC | PRN
  Administered 2023-07-25: 20 mg via INTRAVENOUS

## 2023-07-25 MED ORDER — ROCURONIUM BROMIDE 10 MG/ML (PF) SYRINGE
PREFILLED_SYRINGE | INTRAVENOUS | Status: DC | PRN
  Administered 2023-07-25: 40 mg via INTRAVENOUS

## 2023-07-25 MED ORDER — CHLORHEXIDINE GLUCONATE 0.12 % MT SOLN
15.0000 mL | Freq: Once | OROMUCOSAL | Status: AC
  Administered 2023-07-25: 15 mL via OROMUCOSAL
  Filled 2023-07-25: qty 15

## 2023-07-25 MED ORDER — PROPOFOL 10 MG/ML IV BOLUS
INTRAVENOUS | Status: DC | PRN
  Administered 2023-07-25: 60 mg via INTRAVENOUS

## 2023-07-25 MED ORDER — FENTANYL CITRATE (PF) 250 MCG/5ML IJ SOLN
INTRAMUSCULAR | Status: DC | PRN
  Administered 2023-07-25: 25 ug via INTRAVENOUS
  Administered 2023-07-25: 50 ug via INTRAVENOUS
  Administered 2023-07-25: 25 ug via INTRAVENOUS

## 2023-07-25 MED ORDER — ONDANSETRON HCL 4 MG/2ML IJ SOLN
INTRAMUSCULAR | Status: DC | PRN
  Administered 2023-07-25: 4 mg via INTRAVENOUS

## 2023-07-25 MED ORDER — LACTATED RINGERS IV SOLN: INTRAVENOUS | Status: DC

## 2023-07-25 MED ORDER — SODIUM CHLORIDE 0.9 % IV SOLN
INTRAVENOUS | Status: DC | PRN
Start: 2023-07-25 — End: 2023-07-25

## 2023-07-25 NOTE — Transfer of Care (Signed)
Immediate Anesthesia Transfer of Care Note  Patient: [name]  Procedure(s) Performed: VIDEO BRONCHOSCOPY WITH ENDOBRONCHIAL ULTRASOUND FINE NEEDLE ASPIRATION BIOPSY  Patient Location: Endoscopy Unit  Anesthesia Type:General  Level of Consciousness: awake  Airway & Oxygen Therapy: Patient Spontanous Breathing and Patient connected to nasal cannula oxygen  Post-op Assessment: Report given to RN and Post -op Vital signs reviewed and stable  Post vital signs: Reviewed and stable  Last Vitals:  Vitals Value Taken Time  BP 111/53 07/25/23 1504  Temp 36.4 C 07/25/23 1504  Pulse 96 07/25/23 1507  Resp 17 07/25/23 1507  SpO2 89 % 07/25/23 1507  Vitals shown include unfiled device data.  Last Pain:  Vitals:   07/25/23 1504  PainSc: 0-No pain         Complications: No notable events documented.

## 2023-07-25 NOTE — Interval H&P Note (Signed)
History and Physical Interval Note:  07/25/2023 12:55 PM  [name]  has presented today for surgery, with the diagnosis of RIGHT HILAR MASS.  The various methods of treatment have been discussed with the patient and family. After consideration of risks, benefits and other options for treatment, the patient has consented to  Procedure(s): VIDEO BRONCHOSCOPY WITH ENDOBRONCHIAL ULTRASOUND (N/A) as a surgical intervention.  The patient's history has been reviewed, patient examined, no change in status, stable for surgery.  I have reviewed the patient's chart and labs.  Questions were answered to the patient's satisfaction.     Leslye Peer

## 2023-07-25 NOTE — Anesthesia Procedure Notes (Signed)
Procedure Name: Intubation Date/Time: 07/25/2023 2:26 PM  Performed by: Loleta Aryam Zhan, CRNAPre-anesthesia Checklist: Patient identified, Patient being monitored, Timeout performed, Emergency Drugs available and Suction available Patient Re-evaluated:Patient Re-evaluated prior to induction Oxygen Delivery Method: Circle system utilized Preoxygenation: Pre-oxygenation with 100% oxygen Induction Type: IV induction Ventilation: Mask ventilation without difficulty Laryngoscope Size: Mac and 4 Grade View: Grade I Tube type: Oral Tube size: 8.5 mm Number of attempts: 1 Airway Equipment and Method: Stylet Placement Confirmation: ETT inserted through vocal cords under direct vision, positive ETCO2 and breath sounds checked- equal and bilateral Secured at: 20 cm Tube secured with: Tape Dental Injury: Teeth and Oropharynx as per pre-operative assessment

## 2023-07-25 NOTE — Discharge Instructions (Signed)
Flexible Bronchoscopy, Care After This sheet gives you information about how to care for yourself after your test. Your doctor may also give you more specific instructions. If you have problems or questions, contact your doctor. Follow these instructions at home: Eating and drinking When your numbness is gone and your cough and gag reflexes have come back, you may: Eat only soft foods. Slowly drink liquids. The day after the test, go back to your normal diet. Driving Do not drive for 24 hours if you were given a medicine to help you relax (sedative). Do not drive or use heavy machinery while taking prescription pain medicine. General instructions  Take over-the-counter and prescription medicines only as told by your doctor. Return to your normal activities as told. Ask what activities are safe for you. Do not use any products that have nicotine or tobacco in them. This includes cigarettes and e-cigarettes. If you need help quitting, ask your doctor. Keep all follow-up visits as told by your doctor. This is important. It is very important if you had a tissue sample (biopsy) taken. Get help right away if: You have shortness of breath that gets worse. You get light-headed. You feel like you are going to pass out (faint). You have chest pain. You cough up: More than a little blood. More blood than before. Summary Do not eat or drink anything (not even water) for 2 hours after your test, or until your numbing medicine wears off. Do not use cigarettes. Do not use e-cigarettes. Get help right away if you have chest pain.  Please call our office for any questions or concerns.  [phone].  This information is not intended to replace advice given to you by your health care provider. Make sure you discuss any questions you have with your health care provider. Document Released: 08/01/2009 Document Revised: 09/16/2017 Document Reviewed: 10/22/2016 Elsevier Patient Education  2020 Elsevier  Inc.  

## 2023-07-25 NOTE — Op Note (Signed)
Video Bronchoscopy with Endobronchial Ultrasound Procedure Note  Date of Operation: 07/25/2023  Pre-op Diagnosis: Right hilar mass  Post-op Diagnosis: Same  Surgeon: Levy Pupa  Assistants: None  Anesthesia: General endotracheal anesthesia  Operation: Flexible video fiberoptic bronchoscopy with endobronchial ultrasound and biopsies.  Estimated Blood Loss: Minimal  Complications: None apparent  Indications and History: [name] is a 67 y.o. female with history of tobacco use.  She was under evaluation for weight loss and had CT scan that identified a right hilar nodal mass concerning for possible malignancy.  Recommendation made to achieve a tissue diagnosis via bronchoscopy with endobronchial ultrasound and biopsies.  The risks, benefits, complications, treatment options and expected outcomes were discussed with the patient.  The possibilities of pneumothorax, pneumonia, reaction to medication, pulmonary aspiration, perforation of a viscus, bleeding, failure to diagnose a condition and creating a complication requiring transfusion or operation were discussed with the patient who freely signed the consent.    Description of Procedure: The patient was examined in the preoperative area and history and data from the preprocedure consultation were reviewed. It was deemed appropriate to proceed.  The patient was taken to [facility] endoscopy room 3, identified as [name] and the procedure verified as Flexible Video Fiberoptic Bronchoscopy.  A Time Out was held and the above information confirmed. After being taken to the operating room general anesthesia was initiated and the patient  was orally intubated. The video fiberoptic bronchoscope was introduced via the endotracheal tube and a general inspection was performed which showed normal airways throughout.  There were no abnormal secretions or endobronchial lesions seen. The standard scope was then withdrawn and the  endobronchial ultrasound was used to identify and characterize the peritracheal, hilar and bronchial lymph nodes. Inspection showed a large vascular right hilar mass in the region of nodes 4R and 10R. Using real-time ultrasound guidance Wang needle biopsies were take from the right hilar mass and were sent for cytology. The patient tolerated the procedure well without apparent complications. There was no significant blood loss. The bronchoscope was withdrawn. Anesthesia was reversed and the patient was taken to the PACU for recovery.   Samples: 1. Wang needle biopsies from right hilar mass   Plans:  The patient will be discharged from the PACU to home when recovered from anesthesia. We will review the cytology, pathology results with the patient when they become available. Outpatient followup will be with Dr. Delton Coombes and Saralyn Pilar, NP.   Levy Pupa, MD, PhD 07/25/2023, 2:50 PM  Pulmonary and Critical Care [phone] or if no answer before 7:00PM call [phone] For any issues after 7:00PM please call eLink [phone]

## 2023-07-25 NOTE — Anesthesia Preprocedure Evaluation (Signed)
Anesthesia Evaluation  Patient identified by MRN, date of birth, ID band Patient awake    Reviewed: Allergy & Precautions, NPO status , Patient's Chart, lab work & pertinent test results  History of Anesthesia Complications Negative for: history of anesthetic complications  Airway Mallampati: I  TM Distance: >3 FB Neck ROM: Full    Dental  (+) Edentulous Upper, Edentulous Lower, Dental Advisory Given   Pulmonary shortness of breath, asthma , COPD,  COPD inhaler, former smoker    + decreased breath sounds      Cardiovascular hypertension, + Peripheral Vascular Disease   Rhythm:Regular     Neuro/Psych  PSYCHIATRIC DISORDERS  Depression     Neuromuscular disease CVA    GI/Hepatic hiatal hernia, PUD,GERD  Medicated and Controlled,,  Endo/Other  negative endocrine ROS    Renal/GU      Musculoskeletal  (+) Arthritis ,    Abdominal   Peds  Hematology Lab Results      Component                Value               Date                      WBC                      3.6 (L)             07/25/2023                HGB                      12.0                07/25/2023                HCT                      36.5                07/25/2023                MCV                      104.0 (H)           07/25/2023                PLT                      189                 07/25/2023              Anesthesia Other Findings   Reproductive/Obstetrics                             Anesthesia Physical Anesthesia Plan  ASA: 3  Anesthesia Plan: General   Post-op Pain Management: Minimal or no pain anticipated   Induction: Intravenous  PONV Risk Score and Plan: 3 and Ondansetron, Dexamethasone, Propofol infusion and TIVA  Airway Management Planned: Oral ETT  Additional Equipment: None  Intra-op Plan:   Post-operative Plan: Extubation in OR  Informed Consent: I have reviewed the patients History and  Physical, chart, labs and discussed the procedure including the risks, benefits and alternatives  for the proposed anesthesia with the patient or authorized representative who has indicated his/her understanding and acceptance.     Dental advisory given  Plan Discussed with: CRNA  Anesthesia Plan Comments:        Anesthesia Quick Evaluation

## 2023-07-27 ENCOUNTER — Encounter (HOSPITAL_COMMUNITY): Payer: Self-pay | Admitting: Emergency Medicine

## 2023-07-27 NOTE — Anesthesia Postprocedure Evaluation (Signed)
Anesthesia Post Note  Patient: [name]  Procedure(s) Performed: VIDEO BRONCHOSCOPY WITH ENDOBRONCHIAL ULTRASOUND FINE NEEDLE ASPIRATION BIOPSY     Patient location during evaluation: PACU Anesthesia Type: General Level of consciousness: awake and alert Pain management: pain level controlled Vital Signs Assessment: post-procedure vital signs reviewed and stable Respiratory status: spontaneous breathing, nonlabored ventilation and respiratory function stable Cardiovascular status: blood pressure returned to baseline and stable Postop Assessment: no apparent nausea or vomiting Anesthetic complications: no   No notable events documented.  Last Vitals:  Vitals:   07/25/23 1550 07/25/23 1551  BP: (!) 101/58 (!) 101/58  Pulse: 81 83  Resp: 18 18  Temp:    SpO2:  91%    Last Pain:  Vitals:   07/25/23 1551  PainSc: 0-No pain                 Azadeh Hyder

## 2023-07-28 ENCOUNTER — Telehealth: Payer: Self-pay | Admitting: Emergency Medicine

## 2023-07-28 LAB — CYTOLOGY - NON PAP

## 2023-07-28 NOTE — Telephone Encounter (Signed)
Called and discussed the patient's bronchoscopy results with her daughter Carollee Herter.  She had seen the results on MyChart and the patient knows the results as well.  Shows small cell lung cancer.  The PET scan would be most consistent with limited stage.  I made the oncology referral on Monday.  She has follow-up with Saralyn Pilar tomorrow and we can confirm that her appointments with oncology are set, all questions are answered.

## 2023-07-29 ENCOUNTER — Other Ambulatory Visit: Payer: Self-pay

## 2023-07-29 ENCOUNTER — Ambulatory Visit: Payer: Medicare Other | Admitting: Acute Care

## 2023-07-29 ENCOUNTER — Encounter: Payer: Self-pay | Admitting: Acute Care

## 2023-07-29 VITALS — BP 120/70 | HR 97 | Temp 98.3°F | Ht 61.0 in | Wt 87.6 lb

## 2023-07-29 DIAGNOSIS — R918 Other nonspecific abnormal finding of lung field: Secondary | ICD-10-CM

## 2023-07-29 DIAGNOSIS — R634 Abnormal weight loss: Secondary | ICD-10-CM

## 2023-07-29 DIAGNOSIS — C3490 Malignant neoplasm of unspecified part of unspecified bronchus or lung: Secondary | ICD-10-CM | POA: Diagnosis not present

## 2023-07-29 DIAGNOSIS — F17200 Nicotine dependence, unspecified, uncomplicated: Secondary | ICD-10-CM

## 2023-07-29 DIAGNOSIS — C801 Malignant (primary) neoplasm, unspecified: Secondary | ICD-10-CM

## 2023-07-29 NOTE — Patient Instructions (Addendum)
It is good to see you today. I am glad you have done so well since the biopsy. The cytology for your biopsy was positive for small cell lung cancer. We have referred you to medical oncology, and you have  an appointment Monday at 1:30 with Dr. Arbutus Ped.  I have ordered an MRI Brain to complete your staging work up You will get a call to get this scheduled.  The oncology staff will take great care of you.  Please work on quitting smoking. You can receive free nicotine replacement therapy (patches, gum, or mints) by calling 1-800-QUIT NOW. Please call so we can get you on the path to becoming a non-smoker. I know it is hard, but you can do this!  Hypnosis for smoking cessation  [employer] [phone]  Acupuncture for smoking cessation  [employer] [phone]

## 2023-07-29 NOTE — Telephone Encounter (Signed)
Per secured chat, Aundra Millet request patient to be scheduled on 10/14 @1 :30 and labs @1pm .

## 2023-07-29 NOTE — Progress Notes (Signed)
[facility] [phone]   [phone]  CONSULT NOTE  REFERRING PHYSICIAN: Dr. Delton Coombes   REASON FOR CONSULTATION:  Small Cell Lung Cancer   HPI [name] is a 67 y.o. female with a medical history of carotid stenosis,  COPD, GERD, upper GI bleed, hyperlipidemia, vitamin D deficiency, and osteoporosis is referred to the clinic for newly diagnosed small cell lung cancer.  She is part of the low-dose lung cancer screening program and had a CT scan the chest on 06/09/2023. The CT showed suspicious right hilar/suprahilar soft tissue fullness which could represent adenopathy.  The patient had a CT scan to further characterize this which was performed on 07/08/2023. This revealed nodal mass within the right hilum measuring 2.7 x 2.0 cm concerning for malignancy as well as prominent right paratracheal lymph node and stable right middle lobe lung nodule measuring 5 mm.  She subsequently had a PET scan on 07/19/2023 showing hypermetabolic right hilar mass concerning for bronchogenic carcinoma and hypermetabolic right paratracheal lymph node concerning for nodal metastasis.  There is no evidence of lung primary.  There is no evidence of distant metastatic disease or primary lung lesion.  She is scheduled for her staging brain MRI on 08/11/2023.  Dr. Delton Coombes arranged for bronchoscopy and biopsy on 07/25/2023. The final pathology ([id number]) of the FNA of the right hilar lymph node showed small cell lung cancer.   She was referred to the clinic to discuss treatment options.  Today she is feeling "ok". The main concern is that the patient lost 40 lbs unintentionally over this last year, despite having a good appetite. She is 87 lbs today. She weighted around 115 lbs at baseline. This is what clued the patient in to seek medical evaluation. He has some baseline dyspnea on exertion.  She reports this is about at her baseline.  The severity of her shortness of breath  depends on how hard she is exerting herself and the humidity.  She has some baseline cough which produces "stringy" white phlegm.  She denies any hemoptysis or chest pain.  She has inhalers for her COPD.  She denies any chest pain.  Denies any nausea, vomiting, diarrhea, or constipation.  Denies any headache or visual changes.   Her parents both had dementia and heart failure.  Her brother had a heart attack and diabetes.  She had an aunt with colon cancer.  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 She is widowed.  She has 1 daughter, [name], who is a Engineer, civil (consulting).  The patient 52 years.  She averaged 1 pack of cigarettes per day.  She did at 1 point quit for 5 years.  She is working on quitting at this time.  Denies any significant alcohol use.  She may drink 1 or 2 alcoholic beverages per month.   HPI  Past Medical History:  Diagnosis Date   Allergy    Anemia    Arthritis    Asthma    Blood transfusion without reported diagnosis    Cataract    COPD (chronic obstructive pulmonary disease) (HCC)    Duodenal ulcer    Emphysema of lung (HCC)    Family history of malignant neoplasm of gastrointestinal tract    GERD (gastroesophageal reflux disease)    Hepatitis    had in 6th grade pt unsure which kind   Hiatal hernia    HTN (hypertension) 10/03/2014   Hx of colonic polyps  Hyperlipemia    Internal hemorrhoids    Osteoporosis    Peripheral vascular disease (HCC)    Pneumonia    hx of   Shortness of breath dyspnea    with ambulation   Stroke (HCC)    Vitamin D deficiency     Past Surgical History:  Procedure Laterality Date   ABDOMINAL HYSTERECTOMY     APPENDECTOMY     CATARACT EXTRACTION, BILATERAL     COLONOSCOPY     ELBOW SURGERY Right    ENDARTERECTOMY Right 12/16/2014   Procedure: ENDARTERECTOMY CAROTID with Dacron patch;  Surgeon: Larina Earthly, MD;  Location: [facility] OR;  Service: Vascular;  Laterality: Right;   ESOPHAGOGASTRODUODENOSCOPY   10/2008   FINE NEEDLE ASPIRATION BIOPSY  07/25/2023   Procedure: FINE NEEDLE ASPIRATION BIOPSY;  Surgeon: Leslye Peer, MD;  Location: [facility] END[facility]COPY;  Service: Pulmonary;;   HAND SURGERY Left    LUMBAR DISC SURGERY     TOTAL HIP ARTHROPLASTY Right    trigeminal neuralgia surgery     UPPER GASTROINTESTINAL ENDOSCOPY     VIDEO BRONCHOSCOPY WITH ENDOBRONCHIAL ULTRASOUND N/A 07/25/2023   Procedure: VIDEO BRONCHOSCOPY WITH ENDOBRONCHIAL ULTRASOUND;  Surgeon: Leslye Peer, MD;  Location: [facility]OSCOPY;  Service: Pulmonary;  Laterality: N/A;   WRIST SURGERY Left     Family History  Problem Relation Age of Onset   Hypertension Father    Hyperlipidemia Father    Heart disease Father    Heart attack Father    Colon polyps Mother    Inflammatory bowel disease Mother    Hypertension Mother    Stroke Mother    Heart disease Mother    Hyperlipidemia Mother    Varicose Veins Mother    Heart attack Mother    AAA (abdominal aortic aneurysm) Mother    Diabetes Maternal Grandfather    Heart disease Maternal Grandfather    Heart disease Maternal Grandmother    Colon cancer Other        mat great aunt   Heart disease Brother    Hyperlipidemia Brother    Hypertension Brother    Heart attack Brother    AAA (abdominal aortic aneurysm) Brother    Varicose Veins Daughter    Esophageal cancer Neg Hx    Stomach cancer Neg Hx    Rectal cancer Neg Hx     Social History Social History   Tobacco Use   Smoking status: Former    Current packs/day: 0.00    Average packs/day: 0.3 packs/day for 36.0 years (9.0 ttl pk-yrs)    Types: Cigarettes    Start date: 09/14/1981    Quit date: 09/14/2017    Years since quitting: 5.8   Smokeless tobacco: Never   Tobacco comments:    1-2 sometimes not daily  Vaping Use   Vaping status: Never Used  Substance Use Topics   Alcohol use: Not Currently    Alcohol/week: 0.0 standard drinks of alcohol   Drug use: No    Allergies  Allergen Reactions    Decadron [Dexamethasone] Other (See Comments)    Stomach issues, history of GI bleed   Celebrex [Celecoxib] Other (See Comments)    GI bleed   Crestor [Rosuvastatin] Other (See Comments)    Made legs hurt   Doxycycline Other (See Comments)    Stomach upset    Penicillins Other (See Comments)    Has patient had a PCN reaction causing immediate rash, facial/tongue/throat swelling, SOB or lightheadedness with hypotension: Yes Has patient had  a PCN reaction causing severe rash involving mucus membranes or skin necrosis: No Has patient had a PCN reaction that required hospitalization: No Has patient had a PCN reaction occurring within the last 10 years: Yes If all of the above answers are "NO", then may proceed with Cephalosporin use.   REACTION: whelps   Pravastatin Other (See Comments)    Muscle aches   Hydrocodone-Acetaminophen Hives, Itching and Rash    Current Outpatient Medications  Medication Sig Dispense Refill   albuterol (PROVENTIL) (2.5 MG/3ML) 0.083% nebulizer solution Take 3 mLs (2.5 mg total) by nebulization every 6 (six) hours as needed for wheezing or shortness of breath. 75 mL 12   albuterol (VENTOLIN HFA) 108 (90 Base) MCG/ACT inhaler 2 Inhalations 15 to 20  minutes apart every 4 hours to Rescue Asthma 18 g 3   Ascorbic Acid (VITAMIN C) 1000 MG tablet Take 1,000 mg by mouth daily. Takes 7,000mg      aspirin EC 81 MG tablet Take 1 tablet (81 mg total) by mouth daily. 30 tablet 0   Cholecalciferol (VITAMIN D) 50 MCG (2000 UT) CAPS Take by mouth. Takes 6,000 units     fluticasone-salmeterol (ADVAIR DISKUS) 250-50 MCG/ACT AEPB Inhale 1 puff into the lungs in the morning and at bedtime. 60 each 1   ipratropium-albuterol (DUONEB) 0.5-2.5 (3) MG/3ML SOLN Take 3 mLs by nebulization every 4 (four) hours as needed. Max:6 doses per day 540 mL 0   IRON, FERROUS SULFATE, PO Take 65 mg by mouth 2 (two) times daily.     omeprazole (PRILOSEC) 20 MG capsule TAKE 1 CAPSULE BY MOUTH TWICE  DAILY TO PREVENT INDIGESTION & HEARTBURN 180 capsule 1   ondansetron (ZOFRAN) 8 MG tablet Take 1 tablet (8 mg total) by mouth every 8 (eight) hours as needed for nausea or vomiting. Starting 3 days after chemotherapy 30 tablet 1   prochlorperazine (COMPAZINE) 10 MG tablet Take 1 tablet (10 mg total) by mouth every 6 (six) hours as needed. 30 tablet 2   zinc gluconate 50 MG tablet Take 50 mg by mouth daily.     ezetimibe (ZETIA) 10 MG tablet Take 1 tablet (10 mg total) by mouth daily. 30 tablet 11   No current facility-administered medications for this visit.    REVIEW OF SYSTEMS:   Review of Systems  Constitutional: Positive for weight change.  Negative for chills, fatigue, and fever.  HENT: Negative for mouth sores, nosebleeds, sore throat and trouble swallowing.   Eyes: Negative for eye problems and icterus.  Respiratory: Positive for baseline dyspnea on exertion and cough.  Negative for  hemoptysis and wheezing.   Cardiovascular: Negative for chest pain and leg swelling.  Gastrointestinal: Negative for abdominal pain, constipation, diarrhea, nausea and vomiting.  Genitourinary: Negative for bladder incontinence, difficulty urinating, dysuria, frequency and hematuria.   Musculoskeletal: Negative for back pain, gait problem, neck pain and neck stiffness.  Skin: Negative for itching and rash.  Neurological: Negative for dizziness, extremity weakness, gait problem, headaches, light-headedness and seizures.  Hematological: Negative for adenopathy. Does not bruise/bleed easily.  Psychiatric/Behavioral: Negative for confusion, depression and sleep disturbance. The patient is not nervous/anxious.     PHYSICAL EXAMINATION:  Blood pressure 138/64, pulse 100, temperature 97.8 F (36.6 C), temperature source Oral, resp. rate 16, weight 87 lb 8 oz (39.7 kg), SpO2 100%.  ECOG PERFORMANCE STATUS: 1  Physical Exam  Constitutional: Oriented to person, place, and time and thin appearing female and  in no distress.  HENT:  Head:  Normocephalic and atraumatic.  Mouth/Throat: Oropharynx is clear and moist. No oropharyngeal exudate.  Eyes: Conjunctivae are normal. Right eye exhibits no discharge. Left eye exhibits no discharge. No scleral icterus.  Neck: Normal range of motion. Neck supple.  Cardiovascular: Normal rate, regular rhythm, normal heart sounds and intact distal pulses.   Pulmonary/Chest: Effort normal and breath sounds normal. No respiratory distress. No wheezes. No rales.  Abdominal: Soft. Bowel sounds are normal. Exhibits no distension and no mass. There is no tenderness.  Musculoskeletal: Normal range of motion. Exhibits no edema.  Lymphadenopathy:    No cervical adenopathy.  Neurological: Alert and oriented to person, place, and time. Exhibits muscle wasting. Gait normal. Coordination normal.  Skin: Skin is warm and dry. No rash noted. Not diaphoretic. No erythema. No pallor.  Psychiatric: Mood, memory and judgment normal.  Vitals reviewed.  LABORATORY DATA: Lab Results  Component Value Date   WBC 3.6 (L) 07/25/2023   HGB 12.0 07/25/2023   HCT 36.5 07/25/2023   MCV 104.0 (H) 07/25/2023   PLT 189 07/25/2023      Chemistry      Component Value Date/Time   NA 139 05/11/2023 0930   NA 141 09/02/2016 1147   K 5.1 05/11/2023 0930   K 4.5 09/02/2016 1147   CL 103 05/11/2023 0930   CO2 26 05/11/2023 0930   CO2 20 (L) 09/02/2016 1147   BUN 14 05/11/2023 0930   BUN 13.1 09/02/2016 1147   CREATININE 0.70 05/11/2023 0930   CREATININE 0.8 09/02/2016 1147      Component Value Date/Time   CALCIUM 10.2 05/11/2023 0930   CALCIUM 9.7 09/02/2016 1147   ALKPHOS 137 (H) 09/17/2017 0417   ALKPHOS 77 09/02/2016 1147   AST 16 05/11/2023 0930   AST 16 09/02/2016 1147   ALT 4 (L) 05/11/2023 0930   ALT 9 09/02/2016 1147   BILITOT 0.2 05/11/2023 0930   BILITOT <0.22 09/02/2016 1147       RADIOGRAPHIC STUDIES: NM PET Image Initial (PI) Skull Base To Thigh (F-18  FDG)  Result Date: 07/19/2023 CLINICAL DATA:  Initial treatment strategy for RIGHT hilar mass. EXAM: NUCLEAR MEDICINE PET SKULL BASE TO THIGH TECHNIQUE: 4.8 mCi F-18 FDG was injected intravenously. Full-ring PET imaging was performed from the skull base to thigh after the radiotracer. CT data was obtained and used for attenuation correction and anatomic localization. Fasting blood glucose: 1 7 mg/dl COMPARISON:  Chest CT 16/07/9603 choose one FINDINGS: Mediastinal blood pool activity: SUV max 2 Liver activity: SUV max NA NECK: No hypermetabolic lymph nodes in the neck. Incidental CT findings: None. CHEST: Intensely hypermetabolic RIGHT hilar mass measures 2.9 cm (image 70/CT series) with SUV max equal 8.7. Solitary hypermetabolic RIGHT paratracheal node measures 14 mm (image 59) with SUV max equal 12 Review of the lung parenchyma demonstrates no hypermetabolic nodule. No axillary lymphadenopathy.  No hypermetabolic breast lesion. Incidental CT findings: Healed posterior LEFT rib fractures ABDOMEN/PELVIS: No abnormal hypermetabolic activity within the liver, pancreas, adrenal glands, or spleen. No hypermetabolic lymph nodes in the abdomen or pelvis. Incidental CT findings: None. SKELETON: No focal hypermetabolic activity to suggest skeletal metastasis. Incidental CT findings: RIGHT hip prosthetic IMPRESSION: 1. Hypermetabolic RIGHT hilar mass is concerning for bronchogenic carcinoma. 2. Hypermetabolic RIGHT paratracheal lymph node is concerning for nodal metastasis. 3. No evidence lung primary. 4. No distant metastatic disease or primary lesion identified. Electronically Signed   By: Genevive Bi M.D.   On: 07/19/2023 14:58   CT Chest W Contrast  Result Date: 07/14/2023 CLINICAL DATA:  Evaluate right hilar adenopathy or mass. EXAM: CT CHEST WITH CONTRAST TECHNIQUE: Multidetector CT imaging of the chest was performed during intravenous contrast administration. RADIATION DOSE REDUCTION: This exam was  performed according to the departmental dose-optimization program which includes automated exposure control, adjustment of the mA and/or kV according to patient size and/or use of iterative reconstruction technique. CONTRAST:  75mL ISOVUE-300 IOPAMIDOL (ISOVUE-300) INJECTION 61% COMPARISON:  Lung cancer screening CT from 06/09/2023 FINDINGS: Cardiovascular: The heart size is normal. Aortic atherosclerosis and coronary artery calcifications. No pericardial effusion. Mediastinum/Nodes: Thyroid gland is normal. The trachea is patent and midline. Normal appearance of the esophagus. -Right paratracheal lymph node measures 1.2 cm, image 50/2. -Nodal mass within the right hilum measures 2.7 x 2.0 cm, image 70/2. Lungs/Pleura: Mild to moderate emphysema with diffuse bronchial wall thickening. Scattered peripheral areas of scarring and subpleural reticulation. Unchanged right middle lobe lung nodule measuring 5 mm, image 117/8. Upper Abdomen: No acute abnormality. Musculoskeletal: No chest wall abnormality. No acute or significant osseous findings. Remote healed left posterolateral rib fractures. Age-indeterminate superior endplate fracture deformity involving the L2 vertebra is unchanged from previous exam. IMPRESSION: 1. Nodal mass within the right hilum measures 2.7 x 2.0 cm. Findings are concerning for malignancy. Recommend further evaluation with PET-CT and tissue sampling. 2. Prominent right paratracheal lymph node measures 1.2 cm. 3. Unchanged right middle lobe lung nodule measuring 5 mm. 4. Emphysema and bronchial wall thickening. 5. Coronary artery calcifications. 6. Aortic Atherosclerosis (ICD10-I70.0) and Emphysema (ICD10-J43.9). Electronically Signed   By: Signa Kell M.D.   On: 07/14/2023 10:55    ASSESSMENT: This is a very pleasant 67 year old Caucasian female with newly diagnosed limited stage small cell lung cancer (T1c, N2, M0) small cell lung cancer.  The patient presented with a hilar mass and right  paratracheal lymphadenopathy.  She was diagnosed in October 2024.  The patient was seen with Dr. Arbutus Ped today.  Dr. Arbutus Ped had a lengthy discussion today with the patient about her current condition and treatment options.  Dr. Arbutus Ped would recommend chemotherapy with cisplatin for an AUC of 5 on day 1 and etoposide 100 mg/m on days 1, 2, and 3 IV every 3 weeks.  This would be with radiation. The patient is interested in this option and she is expected to start her first dose of treatment on ~08/08/2023.  The adverse side effects of treatment were discussed including but not limited to alopecia, myelosuppression, peripheral neuropathy, fatigue, nausea, vomiting, diarrhea, constipation, kidney, renal dysfunction.  I will arrange for chemo education class prior to starting her first cycle of treatment.  I sent her prescription for Compazine 10 mg p.o. every 6 hours as needed for nausea and vomiting. I will also send in zofran every 8 hours PRN for nausea/vomiting starting after day 3 of treatment.   We will see her back for follow-up visit in 2 weeks for evaluation and to manage any adverse side effects of treatment.  I have placed a referral to radiation oncology.  She was instructed to keep her appointment on 08/11/2023 for her brain MRI.  I have referred her to member the nutritionist team for her weight loss and decreased appetite.  I encouraged her to make protein supplemental drinks or purchase what is old commercially.  I discussed the option of a Port-A-Cath which she declined.  The patient has a history of iron deficiency anemia.  We will check baseline iron studies and ferritin today.  We talked about  taking an iron supplement.  She will take this with food to avoid GI upset.  We also discussed slow release iron or liquid iron.  The patient voices understanding of current disease status and treatment options and is in agreement with the current care plan.  All questions were  answered. The patient knows to call the clinic with any problems, questions or concerns. We can certainly see the patient much sooner if necessary.  Thank you so much for allowing me to participate in the care of Cissy A Puffenbarger. I will continue to follow up the patient with you and assist in her care.   Disclaimer: This note was dictated with voice recognition software. Similar sounding words can inadvertently be transcribed and may not be corrected upon review.   Emmilia Sowder L Laverna Dossett August 01, 2023, 2:06 PM  ADDENDUM: Hematology/Oncology Attending: I had a face-to-face encounter with the patient today.  I reviewed her record, lab, scans and recommended her care plan.  This is a very pleasant 67 years old white female with multiple medical problems including history of COPD, GERD, upper GI bleeding, dyslipidemia, osteoporosis as well as vitamin D deficiency and carotid stenosis.  The patient was undergoing low-dose lung cancer screening CT and scan on 06/09/2023 showed suspicious right hilar and suprahilar soft tissue fullness suspicious for adenopathy.  Follow-up scan on 07/08/2023 revealed the nodal mass within the right hilum measuring 2.7 x 2.0 cm concerning for malignancy.  There was also a prominent right paratracheal lymph node and stable right middle lobe lung nodule measuring 0.5 cm.  The patient had a PET scan on July 19, 2023 and that showed the hypermetabolic right hilar mass concerning for bronchogenic carcinoma with hypermetabolic right paratracheal lymph node concerning for nodal metastasis but no evidence of distant metastatic disease or other lesions in the lung.  The patient was seen by Dr. Delton Coombes and she underwent bronchoscopy with biopsy of the right hilar lymph node and the final pathology was consistent with a small cell carcinoma. She presented here today for evaluation and discussion of her treatment options.  The patient is very cachectic and lost a lot of weight  recently. I had a lengthy discussion with the patient and her daughter today about her current disease stage, prognosis and treatment options. I recommended for the patient to have MRI of the brain to complete the staging workup and to rule out brain metastasis. If she has no evidence of metastatic disease to the brain, the patient would have limited stage (t1c, N2, M0) small cell lung cancer presented with small right middle lobe lung nodule in addition to right hilar and mediastinal lymphadenopathy.  This was diagnosed in October 2024. I explained to the patient that if there is no evidence of metastatic disease to the brain she could be treated with the curative option with systemic chemotherapy with cisplatin 80 Mg/M2 on day 1 and etoposide 100 Mg/M2 on days 1, 2 and 3 every 3 weeks for 4 cycles concurrent with radiotherapy and followed by prophylactic cranial irradiation. The patient and her daughter are interested in this treatment. We discussed with him the adverse effect of this treatment including but not limited to alopecia, myelosuppression, nausea and vomiting, peripheral neuropathy, liver or renal dysfunction as well as hearing deficit. She is expected to start the first cycle of this treatment next week. The patient will have a chemotherapy education class before the first dose of her treatment. We will also refer her to radiation oncology for discussion  of the radiotherapy option. The patient will come back for follow-up visit in 2 weeks for evaluation and management of any adverse effect of her treatment. She was advised to call immediately if she has any other concerning symptoms in the interval.

## 2023-07-29 NOTE — Progress Notes (Signed)
History of Present Illness [name] is a 67 y.o. female former smoker ( Quit 2018 ). She was referred to Dr. Delton Coombes 07/2023 for evaluation of right hilar mass.   Synopsis 67 year old with a history of tobacco use (over 40 pack years), COPD, GERD with a hiatal hernia, hypertension, peripheral vascular disease, and CVA, presents with an incidental finding of a right hilar/nodal mass and a right middle lobe pulmonary nodule on a recent CT scan. The scan was part of a lung cancer screening program due to their significant smoking history and weight loss. The right hilar mass was confirmed to be 2.7 by 2.0 cm on a subsequent contrast CT scan, and the right middle lobe nodule was unchanged at 5mm.   The patient has been experiencing unintentional weight loss, dropping from 116 to 80 pounds within a year. Despite increasing caloric intake, the weight loss persisted. Initial investigations for thyroid abnormalities returned normal results. The patient has a 40+ year history of smoking approximately a pack a day, with a brief period of cessation for five years. They have no known family history of lung cancer.  Initial lung cancer screening done by PCP was read as a LR 2, but there was an S modifier that noted there was Suspicion of right hilar/suprahilar soft tissue fullness which could represent adenopathy, and they recommended a CT Chest with contrast.  CT chest with contrast showed Nodal mass within the right hilum measures 2.7 x 2.0 cm. Findings are concerning for malignancy. There was also a Prominent right paratracheal lymph node measures 1.2 cm. Recommendation was for a PET scan and tissue sampling.  PET scan showed the hilar mass was intensely hypermetabolic; right paratracheal lymph node hypermetabolic concerning for nodal metastasis;   She underwent Flexible video fiberoptic bronchoscopy with endobronchial ultrasound and biopsies per Dr. Delton Coombes on 07/25/2023. She is here today for follow up  to ensure she has done well after the procedure, and to review cytology results, and determine best plan of care.    07/29/2023 Pt. Presents for follow up. She states she has done well after her biopsy 07/25/2023. No fever, signs or symptoms of infection , adverse reaction to anesthesia, or breathing difficulties.  We have discussed her biopsy results. Dr. Delton Coombes did call her with the results 07/28/2023. They were positive for small  cell cancer in the right hilar lymph node.Marland Kitchen He has referred her to Medical oncology, and she has an appointment with Dr. Arbutus Ped on Monday 08/01/2023.I will order MR Brain as part of her staging work up.  She had a hip replacement in 2007 ,  She has had MRI's since she had her hip replacement , and therefore we will place the order. Patient's daughter is a Engineer, civil (consulting), and she had a few additional questions which were answered. They are comfortable with the referral to Medical oncology, and they are pleased they already have an appointment Monday.  Test Results:  Cytology Results 07/25/2023 FINAL MICROSCOPIC DIAGNOSIS:  A. LYMPH NODE, RIGHT HILAR, FINE NEEDLE ASPIRATION:  - Small cell carcinoma  - See comments   COMMENT:   Immunohistochemical stains were performed to characterize the tumor  cells. The tumor cells are positive for CK AE1/AE3, synaptophysin, and  are negative for chromogranin A and CD45. Ki 67 proliferation index is > 70%. The findings are diagnostic of small cell carcinoma.   Chest CT: Isolated right middle lobe pulmonary nodule 5.2 mm; possible right hilar, suprahilar soft tissue fullness concerning for a mass lesion (  06/09/2023) Chest CT with contrast: Right hilar/nodal mass 2.7 x 2.0 cm; right middle lobe nodule 5 mm, unchanged (07/08/2023) PET scan: Right hilar mass 2.9 cm, intensely hypermetabolic; right paratracheal lymph node hypermetabolic concerning for nodal metastasis; no other distant disease noted (07/19/2023)    Latest Ref Rng & Units  07/25/2023   11:56 AM 05/11/2023    9:30 AM 04/01/2022    2:00 PM  CBC  WBC 4.0 - 10.5 K/uL 3.6  6.6  4.6   Hemoglobin 12.0 - 15.0 g/dL 40.9  81.1  91.4   Hematocrit 36.0 - 46.0 % 36.5  41.5  34.3   Platelets 150 - 400 K/uL 189  284  297        Latest Ref Rng & Units 05/11/2023    9:30 AM 03/18/2022   12:09 PM 10/24/2019   10:03 AM  BMP  Glucose 65 - 99 mg/dL 78  81  88   BUN 7 - 25 mg/dL 14  7  29    Creatinine 0.50 - 1.05 mg/dL 7.82  9.56  2.13   BUN/Creat Ratio 6 - 22 (calc) SEE NOTE:  NOT APPLICABLE  35   Sodium 135 - 146 mmol/L 139  143  141   Potassium 3.5 - 5.3 mmol/L 5.1  4.3  4.9   Chloride 98 - 110 mmol/L 103  112  108   CO2 20 - 32 mmol/L 26  20  22    Calcium 8.6 - 10.4 mg/dL 08.6  8.9  9.4     BNP    Component Value Date/Time   BNP 365.0 (H) 09/17/2017 1632    ProBNP No results found for: "PROBNP"  PFT No results found for: "FEV1PRE", "FEV1POST", "FVCPRE", "FVCPOST", "TLC", "DLCOUNC", "PREFEV1FVCRT", "PSTFEV1FVCRT"  NM PET Image Initial (PI) Skull Base To Thigh (F-18 FDG)  Result Date: 07/19/2023 CLINICAL DATA:  Initial treatment strategy for RIGHT hilar mass. EXAM: NUCLEAR MEDICINE PET SKULL BASE TO THIGH TECHNIQUE: 4.8 mCi F-18 FDG was injected intravenously. Full-ring PET imaging was performed from the skull base to thigh after the radiotracer. CT data was obtained and used for attenuation correction and anatomic localization. Fasting blood glucose: 1 7 mg/dl COMPARISON:  Chest CT 57/84/6962 choose one FINDINGS: Mediastinal blood pool activity: SUV max 2 Liver activity: SUV max NA NECK: No hypermetabolic lymph nodes in the neck. Incidental CT findings: None. CHEST: Intensely hypermetabolic RIGHT hilar mass measures 2.9 cm (image 70/CT series) with SUV max equal 8.7. Solitary hypermetabolic RIGHT paratracheal node measures 14 mm (image 59) with SUV max equal 12 Review of the lung parenchyma demonstrates no hypermetabolic nodule. No axillary lymphadenopathy.  No  hypermetabolic breast lesion. Incidental CT findings: Healed posterior LEFT rib fractures ABDOMEN/PELVIS: No abnormal hypermetabolic activity within the liver, pancreas, adrenal glands, or spleen. No hypermetabolic lymph nodes in the abdomen or pelvis. Incidental CT findings: None. SKELETON: No focal hypermetabolic activity to suggest skeletal metastasis. Incidental CT findings: RIGHT hip prosthetic IMPRESSION: 1. Hypermetabolic RIGHT hilar mass is concerning for bronchogenic carcinoma. 2. Hypermetabolic RIGHT paratracheal lymph node is concerning for nodal metastasis. 3. No evidence lung primary. 4. No distant metastatic disease or primary lesion identified. Electronically Signed   By: Genevive Bi M.D.   On: 07/19/2023 14:58   CT Chest W Contrast  Result Date: 07/14/2023 CLINICAL DATA:  Evaluate right hilar adenopathy or mass. EXAM: CT CHEST WITH CONTRAST TECHNIQUE: Multidetector CT imaging of the chest was performed during intravenous contrast administration. RADIATION DOSE REDUCTION: This exam was performed according  to the departmental dose-optimization program which includes automated exposure control, adjustment of the mA and/or kV according to patient size and/or use of iterative reconstruction technique. CONTRAST:  75mL ISOVUE-300 IOPAMIDOL (ISOVUE-300) INJECTION 61% COMPARISON:  Lung cancer screening CT from 06/09/2023 FINDINGS: Cardiovascular: The heart size is normal. Aortic atherosclerosis and coronary artery calcifications. No pericardial effusion. Mediastinum/Nodes: Thyroid gland is normal. The trachea is patent and midline. Normal appearance of the esophagus. -Right paratracheal lymph node measures 1.2 cm, image 50/2. -Nodal mass within the right hilum measures 2.7 x 2.0 cm, image 70/2. Lungs/Pleura: Mild to moderate emphysema with diffuse bronchial wall thickening. Scattered peripheral areas of scarring and subpleural reticulation. Unchanged right middle lobe lung nodule measuring 5 mm,  image 117/8. Upper Abdomen: No acute abnormality. Musculoskeletal: No chest wall abnormality. No acute or significant osseous findings. Remote healed left posterolateral rib fractures. Age-indeterminate superior endplate fracture deformity involving the L2 vertebra is unchanged from previous exam. IMPRESSION: 1. Nodal mass within the right hilum measures 2.7 x 2.0 cm. Findings are concerning for malignancy. Recommend further evaluation with PET-CT and tissue sampling. 2. Prominent right paratracheal lymph node measures 1.2 cm. 3. Unchanged right middle lobe lung nodule measuring 5 mm. 4. Emphysema and bronchial wall thickening. 5. Coronary artery calcifications. 6. Aortic Atherosclerosis (ICD10-I70.0) and Emphysema (ICD10-J43.9). Electronically Signed   By: Signa Kell M.D.   On: 07/14/2023 10:55     Past medical hx Past Medical History:  Diagnosis Date   Allergy    Anemia    Arthritis    Asthma    Blood transfusion without reported diagnosis    Cataract    COPD (chronic obstructive pulmonary disease) (HCC)    Duodenal ulcer    Emphysema of lung (HCC)    Family history of malignant neoplasm of gastrointestinal tract    GERD (gastroesophageal reflux disease)    Hepatitis    had in 6th grade pt unsure which kind   Hiatal hernia    HTN (hypertension) 10/03/2014   Hx of colonic polyps    Hyperlipemia    Internal hemorrhoids    Osteoporosis    Peripheral vascular disease (HCC)    Pneumonia    hx of   Shortness of breath dyspnea    with ambulation   Stroke (HCC)    Vitamin D deficiency      Social History   Tobacco Use   Smoking status: Former    Current packs/day: 0.00    Average packs/day: 0.3 packs/day for 36.0 years (9.0 ttl pk-yrs)    Types: Cigarettes    Start date: 09/14/1981    Quit date: 09/14/2017    Years since quitting: 5.8   Smokeless tobacco: Never   Tobacco comments:    1-2 sometimes not daily  Vaping Use   Vaping status: Never Used  Substance Use Topics    Alcohol use: Not Currently    Alcohol/week: 0.0 standard drinks of alcohol   Drug use: No    Ms.Hemme reports that she quit smoking about 5 years ago. Her smoking use included cigarettes. She started smoking about 41 years ago. She has a 9 pack-year smoking history. She has never used smokeless tobacco. She reports that she does not currently use alcohol. She reports that she does not use drugs.  Tobacco Cessation: She was a former smoker , but started smoking again after her husbands death 6 years ago. Current every day smoker . She states she quit within this week. She is motivated to remain smoke  free in light of her new diagnosis.Smoking cessation counseling x 3-4 minutes.  Past surgical hx, Family hx, Social hx all reviewed.  Current Outpatient Medications on File Prior to Visit  Medication Sig   albuterol (PROVENTIL) (2.5 MG/3ML) 0.083% nebulizer solution Take 3 mLs (2.5 mg total) by nebulization every 6 (six) hours as needed for wheezing or shortness of breath.   albuterol (VENTOLIN HFA) 108 (90 Base) MCG/ACT inhaler 2 Inhalations 15 to 20  minutes apart every 4 hours to Rescue Asthma   Ascorbic Acid (VITAMIN C) 1000 MG tablet Take 1,000 mg by mouth daily. Takes 7,000mg    aspirin EC 81 MG tablet Take 1 tablet (81 mg total) by mouth daily.   Cholecalciferol (VITAMIN D) 50 MCG (2000 UT) CAPS Take by mouth. Takes 6,000 units   ezetimibe (ZETIA) 10 MG tablet Take 1 tablet (10 mg total) by mouth daily.   fluticasone-salmeterol (ADVAIR DISKUS) 250-50 MCG/ACT AEPB Inhale 1 puff into the lungs in the morning and at bedtime.   ipratropium-albuterol (DUONEB) 0.5-2.5 (3) MG/3ML SOLN Take 3 mLs by nebulization every 4 (four) hours as needed. Max:6 doses per day   IRON, FERROUS SULFATE, PO Take 65 mg by mouth 2 (two) times daily.   omeprazole (PRILOSEC) 20 MG capsule TAKE 1 CAPSULE BY MOUTH TWICE DAILY TO PREVENT INDIGESTION & HEARTBURN   zinc gluconate 50 MG tablet Take 50 mg by mouth daily.    No current facility-administered medications on file prior to visit.     Allergies  Allergen Reactions   Decadron [Dexamethasone] Other (See Comments)    Stomach issues, history of GI bleed   Celebrex [Celecoxib] Other (See Comments)    GI bleed   Crestor [Rosuvastatin] Other (See Comments)    Made legs hurt   Doxycycline Other (See Comments)    Stomach upset    Penicillins Other (See Comments)    Has patient had a PCN reaction causing immediate rash, facial/tongue/throat swelling, SOB or lightheadedness with hypotension: Yes Has patient had a PCN reaction causing severe rash involving mucus membranes or skin necrosis: No Has patient had a PCN reaction that required hospitalization: No Has patient had a PCN reaction occurring within the last 10 years: Yes If all of the above answers are "NO", then may proceed with Cephalosporin use.   REACTION: whelps   Pravastatin Other (See Comments)    Muscle aches   Hydrocodone-Acetaminophen Hives, Itching and Rash    Review Of Systems:  Constitutional:   +  weight loss, night sweats,  Fevers, chills, fatigue, or  lassitude.  HEENT:   No headaches,  Difficulty swallowing,  Tooth/dental problems, or  + Sore throat,                No sneezing, itching, ear ache, nasal congestion, post nasal drip,   CV:  No chest pain,  Orthopnea, PND, swelling in lower extremities, anasarca, dizziness, palpitations, syncope.   GI  No heartburn, indigestion, abdominal pain, nausea, vomiting, diarrhea, change in bowel habits, loss of appetite, bloody stools.   Resp: + baseline  shortness of breath with exertion less at rest.  No excess mucus, no productive cough,  No non-productive cough,  No coughing up of blood.  No change in color of mucus.  No wheezing.  No chest wall deformity  Skin: no rash or lesions.  GU: no dysuria, change in color of urine, no urgency or frequency.  No flank pain, no hematuria   MS:  No joint pain or swelling.  No decreased  range of motion.  No back pain.  Psych:  No change in mood or affect. No depression or anxiety.  No memory loss.   Vital Signs BP 120/70 (BP Location: Left Arm, Patient Position: Sitting, Cuff Size: Normal)   Pulse 97   Temp 98.3 F (36.8 C) (Oral)   Ht 5\' 1"  (1.549 m)   Wt 87 lb 9.6 oz (39.7 kg)   SpO2 97%   BMI 16.55 kg/m    Physical Exam:  General- No distress,  A&Ox3, pleasant , appropriate ENT: No sinus tenderness, TM clear, pale nasal mucosa, no oral exudate,no post nasal drip, no LAN Cardiac: S1, S2, regular rate and rhythm, no murmur Chest: No wheeze/ rales/ dullness; no accessory muscle use, no nasal flaring, no sternal retractions, slightly diminished per bases Abd.: Soft Non-tender, ND, BS +, Body mass index is 16.55 kg/m.  Ext: No clubbing cyanosis, edema Neuro:  physical deconditioning, MAE x 4, A&O x 3 Skin: No rashes, warm and dry, no lesions  Psych: normal mood and behavior   Assessment/Plan Right hilar adenopathy on LDCT CT chest with Contrast >> Nodal mass within the right hilum measures 2.7 x 2.0 cm. Findings are concerning for malignancy.  Intensely PET avid right hilar mass and paratracheal lymph node Bronchoscopy with biopsies >> Small cell Cancer, unsure Primary site Plan I am glad you have done so well since the biopsy. The cytology for your biopsy was positive for small cell lung cancer. We have referred you to medical oncology, and you have  an appointment Monday at 1:30 with Dr. Arbutus Ped.  I have ordered an MRI Brain to complete your staging work up You will get a call to get this scheduled.  The oncology staff will take great care of you.  Please work on quitting smoking. You can receive free nicotine replacement therapy (patches, gum, or mints) by calling 1-800-QUIT NOW. Please call so we can get you on the path to becoming a non-smoker. I know it is hard, but you can do this!  Hypnosis for smoking cessation  [employer]. [phone]  Acupuncture for smoking cessation  [employer]  I spent 35 minutes dedicated to the care of this patient on the date of this encounter to include pre-visit review of records, face-to-face time with the patient discussing conditions above, post visit ordering of testing, clinical documentation with the electronic health record, making appropriate referrals as documented, and communicating necessary information to the patient's healthcare team.    Bevelyn Ngo, NP 07/29/2023  2:25 PM

## 2023-08-01 ENCOUNTER — Inpatient Hospital Stay: Payer: Medicare Other | Attending: Physician Assistant

## 2023-08-01 ENCOUNTER — Encounter: Payer: Self-pay | Admitting: Internal Medicine

## 2023-08-01 ENCOUNTER — Inpatient Hospital Stay: Payer: Medicare Other | Admitting: Physician Assistant

## 2023-08-01 ENCOUNTER — Other Ambulatory Visit: Payer: Self-pay

## 2023-08-01 VITALS — BP 138/64 | HR 100 | Temp 97.8°F | Resp 16 | Wt 87.5 lb

## 2023-08-01 DIAGNOSIS — Z79899 Other long term (current) drug therapy: Secondary | ICD-10-CM | POA: Insufficient documentation

## 2023-08-01 DIAGNOSIS — M810 Age-related osteoporosis without current pathological fracture: Secondary | ICD-10-CM | POA: Diagnosis not present

## 2023-08-01 DIAGNOSIS — Z5111 Encounter for antineoplastic chemotherapy: Secondary | ICD-10-CM | POA: Insufficient documentation

## 2023-08-01 DIAGNOSIS — C3401 Malignant neoplasm of right main bronchus: Secondary | ICD-10-CM

## 2023-08-01 DIAGNOSIS — E559 Vitamin D deficiency, unspecified: Secondary | ICD-10-CM | POA: Insufficient documentation

## 2023-08-01 DIAGNOSIS — Z87891 Personal history of nicotine dependence: Secondary | ICD-10-CM | POA: Diagnosis not present

## 2023-08-01 DIAGNOSIS — J449 Chronic obstructive pulmonary disease, unspecified: Secondary | ICD-10-CM

## 2023-08-01 DIAGNOSIS — C3490 Malignant neoplasm of unspecified part of unspecified bronchus or lung: Secondary | ICD-10-CM | POA: Insufficient documentation

## 2023-08-01 DIAGNOSIS — D509 Iron deficiency anemia, unspecified: Secondary | ICD-10-CM

## 2023-08-01 DIAGNOSIS — Z800 Family history of malignant neoplasm of digestive organs: Secondary | ICD-10-CM | POA: Insufficient documentation

## 2023-08-01 DIAGNOSIS — K219 Gastro-esophageal reflux disease without esophagitis: Secondary | ICD-10-CM

## 2023-08-01 LAB — CBC WITH DIFFERENTIAL (CANCER CENTER ONLY)
Abs Immature Granulocytes: 0.01 10*3/uL (ref 0.00–0.07)
Basophils Absolute: 0.1 10*3/uL (ref 0.0–0.1)
Basophils Relative: 1 %
Eosinophils Absolute: 0.1 10*3/uL (ref 0.0–0.5)
Eosinophils Relative: 3 %
HCT: 38.2 % (ref 36.0–46.0)
Hemoglobin: 12.9 g/dL (ref 12.0–15.0)
Immature Granulocytes: 0 %
Lymphocytes Relative: 35 %
Lymphs Abs: 1.3 10*3/uL (ref 0.7–4.0)
MCH: 34.7 pg — ABNORMAL HIGH (ref 26.0–34.0)
MCHC: 33.8 g/dL (ref 30.0–36.0)
MCV: 102.7 fL — ABNORMAL HIGH (ref 80.0–100.0)
Monocytes Absolute: 0.6 10*3/uL (ref 0.1–1.0)
Monocytes Relative: 16 %
Neutro Abs: 1.7 10*3/uL (ref 1.7–7.7)
Neutrophils Relative %: 45 %
Platelet Count: 238 10*3/uL (ref 150–400)
RBC: 3.72 MIL/uL — ABNORMAL LOW (ref 3.87–5.11)
RDW: 15.5 % (ref 11.5–15.5)
WBC Count: 3.8 10*3/uL — ABNORMAL LOW (ref 4.0–10.5)
nRBC: 0 % (ref 0.0–0.2)

## 2023-08-01 LAB — CMP (CANCER CENTER ONLY)
ALT: 7 U/L (ref 0–44)
AST: 17 U/L (ref 15–41)
Albumin: 4.3 g/dL (ref 3.5–5.0)
Alkaline Phosphatase: 83 U/L (ref 38–126)
Anion gap: 10 (ref 5–15)
BUN: 10 mg/dL (ref 8–23)
CO2: 24 mmol/L (ref 22–32)
Calcium: 9.6 mg/dL (ref 8.9–10.3)
Chloride: 102 mmol/L (ref 98–111)
Creatinine: 0.79 mg/dL (ref 0.44–1.00)
GFR, Estimated: >60 mL/min (ref >60)
Glucose, Bld: 86 mg/dL (ref 70–99)
Potassium: 4.6 mmol/L (ref 3.5–5.1)
Sodium: 136 mmol/L (ref 135–145)
Total Bilirubin: 0.2 mg/dL — ABNORMAL LOW (ref 0.3–1.2)
Total Protein: 7.6 g/dL (ref 6.5–8.1)

## 2023-08-01 LAB — IRON AND IRON BINDING CAPACITY (CC-WL,HP ONLY)
Iron: 51 ug/dL (ref 28–170)
Saturation Ratios: 10 % — ABNORMAL LOW (ref 10.4–31.8)
TIBC: 539 ug/dL — ABNORMAL HIGH (ref 250–450)
UIBC: 488 ug/dL — ABNORMAL HIGH (ref 148–442)

## 2023-08-01 LAB — MAGNESIUM: Magnesium: 2 mg/dL (ref 1.7–2.4)

## 2023-08-01 LAB — FERRITIN: Ferritin: 16 ng/mL (ref 11–307)

## 2023-08-01 MED ORDER — ONDANSETRON HCL 8 MG PO TABS
8.0000 mg | ORAL_TABLET | Freq: Three times a day (TID) | ORAL | 1 refills | Status: DC | PRN
Start: 2023-08-01 — End: 2023-10-18

## 2023-08-01 MED ORDER — PROCHLORPERAZINE MALEATE 10 MG PO TABS
10.0000 mg | ORAL_TABLET | Freq: Four times a day (QID) | ORAL | 2 refills | Status: DC | PRN
Start: 2023-08-01 — End: 2024-03-15

## 2023-08-01 NOTE — Progress Notes (Signed)
I met pt and her daughter, Carollee Herter, today at the pt's med onc consult with C.Heilingoetter, Onc PA and Dr.Mohamed. I introduced myself and explained the role of a nurse navigator. The plan for the pt at the moment is for chemoradiation. Pt needs a referral to radiation oncology and chemotherapy education class. Pt currently has a brain MRI scheduled for 10/24, however I will attempt to move that appt up. Carollee Herter will be providing transportation for the pt and at this time does not need FMLA paperwork. I escorted Carollee Herter to the scheduling desk to make the pt's chemo education appt and took the pt to the lab for her blood draw. I provided the pt with a Lung Cancer Journey Book and included myself business card for pt and shannon to use should they have any questions or concerns.

## 2023-08-01 NOTE — Progress Notes (Signed)
START ON PATHWAY REGIMEN - Small Cell Lung     A cycle is every 21 days:     Etoposide      Cisplatin   **Always confirm dose/schedule in your pharmacy ordering system**  Patient Characteristics: Newly Diagnosed, Preoperative or Nonsurgical Candidate (Clinical Staging), First Line, Limited Stage, Nonsurgical Candidate Therapeutic Status: Newly Diagnosed, Preoperative or Nonsurgical Candidate (Clinical Staging) AJCC T Category: cT1c AJCC N Category: cN2 AJCC M Category: cM0 AJCC 8 Stage Grouping: IIIA Stage Classification: Limited Surgical Candidacy: Nonsurgical Candidate Intent of Therapy: Curative Intent, Discussed with Patient

## 2023-08-01 NOTE — Telephone Encounter (Signed)
Inbasket message sent to the [facility] scheduling team to get the patient scheduled.

## 2023-08-01 NOTE — Progress Notes (Signed)
MRI rescheduled for 10/17 at 5pm at [facility]. Pts daughter, Carollee Herter, aware of change and that she has to have the pt there at 4:30. No other questions at this time.

## 2023-08-01 NOTE — Patient Instructions (Addendum)
-  There are two main categories of lung cancer, they are named based on the size of the cancer cell. One is called Non-Small cell lung cancer. The other type is Small Cell Lung Cancer -The sample (biopsy) that they took of your tumor was consistent with Small Cell Lung Cancer -We covered a lot of important information at your appointment today regarding what the treatment plan is moving forward. Here are the the main points that were discussed at your office visit with [place] today:  -The treatment that you will receive consists of Two chemotherapy drugs, called Cisplatin/Carboplatin and Etoposide. If you have trouble with cisplatin, then we can always change it to carboplatin  -We are planning on starting your treatment next week on 08/08/23 but before your start your treatment, I would like you to attend a Chemotherapy Education Class. This involves having you sit down with one of our nurse educators. She will discuss with your one-on-one more details about your treatment as well as general information about resources here at the [facility].  -Your treatment will be given for three consecutive days every 3 weeks. We will check your labs once a week just to make sure that important components of your blood are in an acceptable range. We would do these three days in a row every 3 weeks for a total of somewhere between 4.  -We will get a CT scan after 2 treatments to check on the progress of treatment  Medications:  -I have sent a few important medication prescriptions to your pharmacy.  -Compazine was sent to your pharmacy. This medication is for nausea. You may take this every 6 hours as needed if you feel nausous.  -I also went ahead and send you zofran. This is every 8 hours as needed. However, this is to be taken every 8 hours as needed STARTING after 3 days after chemotherapy. The reason why is because  you get a stronger version of zofran while in the clinic that stays in your system for 3 days.    Side Effects:  -The adverse effect of this treatment including but not limited to alopecia (losing your hair), myelosuppression (drops in the blood counts), nausea and vomiting, peripheral neuropathy (numbness and tingling in the hands and feet), hearing deficit, liver or renal dysfunction.   Referrals:  -I have sent a referral to radiation oncology so they can discuss with you the role of radiation treatment in addition to chemotherapy.   Imaging:  -Please keep the appointment as scheduled for the brain MRI   Follow up:  -We will see you back for a follow up visit in __ weeks.

## 2023-08-02 ENCOUNTER — Ambulatory Visit: Payer: Medicare Other | Admitting: Acute Care

## 2023-08-02 ENCOUNTER — Telehealth: Payer: Self-pay

## 2023-08-02 ENCOUNTER — Ambulatory Visit
Admission: RE | Admit: 2023-08-02 | Discharge: 2023-08-02 | Disposition: A | Discharge status: Home / Self Care | Payer: Medicare Other | Source: Ambulatory Visit | Attending: Radiation Oncology | Admitting: Radiation Oncology

## 2023-08-02 ENCOUNTER — Other Ambulatory Visit: Payer: Self-pay | Admitting: Physician Assistant

## 2023-08-02 ENCOUNTER — Encounter: Payer: Self-pay | Admitting: Radiation Oncology

## 2023-08-02 ENCOUNTER — Other Ambulatory Visit: Payer: Self-pay

## 2023-08-02 ENCOUNTER — Encounter: Payer: Self-pay | Admitting: Internal Medicine

## 2023-08-02 DIAGNOSIS — D509 Iron deficiency anemia, unspecified: Secondary | ICD-10-CM

## 2023-08-02 DIAGNOSIS — C3401 Malignant neoplasm of right main bronchus: Secondary | ICD-10-CM

## 2023-08-02 DIAGNOSIS — Z87891 Personal history of nicotine dependence: Secondary | ICD-10-CM | POA: Diagnosis not present

## 2023-08-02 DIAGNOSIS — C342 Malignant neoplasm of middle lobe, bronchus or lung: Secondary | ICD-10-CM | POA: Diagnosis not present

## 2023-08-02 HISTORY — DX: Unspecified macular degeneration: H35.30

## 2023-08-02 MED ORDER — INTEGRA PLUS PO CAPS
1.0000 | ORAL_CAPSULE | Freq: Every morning | ORAL | 2 refills | Status: DC
Start: 2023-08-02 — End: 2024-03-15

## 2023-08-02 NOTE — Telephone Encounter (Signed)
Spoke with pts daughter in regards to the below.  She stated that the iron is upsetting the pts stomach but when she took it Sunday night with food, it was better. I informed daughter to always take the iron after a meal and that will help with stomach upset.  Cassie, PA mentioned below about a prescription and patients daughter said they would like to try that.  Pharmacy confirmed. Daughter had some questions about pts radiation appts and I assisted her as best I could. Daughter was told to call with any questions or concerns.

## 2023-08-02 NOTE — Progress Notes (Signed)
Thoracic Location of Tumor / Histology: Right Lung, right hilar, right paratracheal lymphadenopathy.  Patient presented for lung cancer screening CT Chest.  MRI Brain 08/11/2023:   Bronchoscopy 07/25/2023:  PET 07/19/2023:  CT Chest 07/08/2023:   Biopsies of Right Hilar Mass 07/25/2023   Past/Anticipated interventions by cardiothoracic surgery, if any:   Past/Anticipated interventions by medical oncology, if any:  Cassie Heilingoetter / Dr. Arbutus Ped 08/01/2023 -Dr. Arbutus Ped would recommend chemotherapy with cisplatin for an AUC of 5 on day 1 and etoposide 100 mg/m on days 1, 2, and 3 IV every 3 weeks.  This would be with radiation.    Tobacco/Marijuana/Snuff/ETOH use: Current smoker, trying to quit.  Signs/Symptoms Weight changes, if any: #115-87; eat when hungry, early satiety Respiratory complaints, if any: She has dyspnea on exertion at baseline. Hemoptysis, if any: She has productive cough at baseline with white phlegm.  Pain issues, if any:  No   SAFETY ISSUES: Prior radiation? No  Pacemaker/ICD?  No Possible current pregnancy? No, menopausal and hysterectomy Is the patient on methotrexate? No  Current Complaints / other details:   -Declined Port

## 2023-08-02 NOTE — Progress Notes (Signed)
Radiation Oncology         [phone] ________________________________  Initial Outpatient Consultation - Conducted via telephone at patient request.  I spoke with the patient to conduct this consult visit via telephone. The patient was notified in advance and was offered an in person or telemedicine meeting to allow for face to face communication but instead preferred to proceed with a telephone consult.   Name: [name]        MRN: [id number]  Date of Service: 08/02/2023 DOB: Feb 18, 1956  CC:[name], MD  Si Gaul, MD     REFERRING PHYSICIAN: Si Gaul, MD   DIAGNOSIS: The encounter diagnosis was Small cell carcinoma of hilum of right lung (HCC).   HISTORY OF PRESENT ILLNESS: [name] is a 67 y.o. female seen at the request of Dr. Arbutus Ped for a newly diagnosed lung cancer. The patient went for lung cancer screening CT scan on 06/09/2023 and was found to have an L RADS 2S lesion with possible right hilar/suprahilar soft tissue fullness.  This was further investigated with a CT scan with contrast on 07/08/2023 and showed a mass in the nodal station of the right hilum measuring 2.7 cm concerning for malignancy as well as a prominent right paratracheal lymph node measuring 1.2 cm and a 5 mm right middle lobe nodule.  She had stigmata of emphysema, bronchial wall thickening, coronary and aortic atherosclerosis as well as emphysema.  She underwent a PET scan on 07/19/2023 that showed hypermetabolic activity in the right hilum with the lesion measuring 2.9 cm with an SUV of 8.7 and a solitary hypermetabolic right paratracheal node measuring 1.4 cm with an SUV of 12, blood pool was SUV max of 2.  No evidence of other sites of disease were appreciated, she underwent a bronchoscopy on 07/25/2023 and fine-needle aspirate of a specimen from the right hilum showed small cell carcinoma.  She is scheduled to undergo an MRI this Thursday and has met with Dr. Arbutus Ped to  discuss chemoradiation as primary therapy and is contacted by phone today to discuss the radiation component of this treatment.    PREVIOUS RADIATION THERAPY: No   PAST MEDICAL HISTORY:  Past Medical History:  Diagnosis Date   Allergy    Anemia    Arthritis    Asthma    Blood transfusion without reported diagnosis    Cataract    COPD (chronic obstructive pulmonary disease) (HCC)    Duodenal ulcer    Emphysema of lung (HCC)    Family history of malignant neoplasm of gastrointestinal tract    GERD (gastroesophageal reflux disease)    Hepatitis    had in 6th grade pt unsure which kind   Hiatal hernia    HTN (hypertension) 10/03/2014   Hx of colonic polyps    Hyperlipemia    Internal hemorrhoids    Osteoporosis    Peripheral vascular disease (HCC)    Pneumonia    hx of   Shortness of breath dyspnea    with ambulation   Stroke (HCC)    Vitamin D deficiency        PAST SURGICAL HISTORY: Past Surgical History:  Procedure Laterality Date   ABDOMINAL HYSTERECTOMY     APPENDECTOMY     CATARACT EXTRACTION, BILATERAL     COLONOSCOPY     ELBOW SURGERY Right    ENDARTERECTOMY Right 12/16/2014   Procedure: ENDARTERECTOMY CAROTID with Dacron patch;  Surgeon: Larina Earthly, MD;  Location: [facility] OR;  Service: Vascular;  Laterality: Right;   ESOPHAGOGASTRODUODENOSCOPY  10/2008   FINE NEEDLE ASPIRATION BIOPSY  07/25/2023   Procedure: FINE NEEDLE ASPIRATION BIOPSY;  Surgeon: Leslye Peer, MD;  Location: [facility];  Service: Pulmonary;;   HAND SURGERY Left    LUMBAR DISC SURGERY     TOTAL HIP ARTHROPLASTY Right    trigeminal neuralgia surgery     UPPER GASTROINTESTINAL ENDOSCOPY     VIDEO BRONCHOSCOPY WITH ENDOBRONCHIAL ULTRASOUND N/A 07/25/2023   Procedure: VIDEO BRONCHOSCOPY WITH ENDOBRONCHIAL ULTRASOUND;  Surgeon: Leslye Peer, MD;  Location: [facility];  Service: Pulmonary;  Laterality: N/A;   WRIST SURGERY Left      FAMILY HISTORY:  Family History  Problem Relation  Age of Onset   Hypertension Father    Hyperlipidemia Father    Heart disease Father    Heart attack Father    Colon polyps Mother    Inflammatory bowel disease Mother    Hypertension Mother    Stroke Mother    Heart disease Mother    Hyperlipidemia Mother    Varicose Veins Mother    Heart attack Mother    AAA (abdominal aortic aneurysm) Mother    Diabetes Maternal Grandfather    Heart disease Maternal Grandfather    Heart disease Maternal Grandmother    Colon cancer Other        mat great aunt   Heart disease Brother    Hyperlipidemia Brother    Hypertension Brother    Heart attack Brother    AAA (abdominal aortic aneurysm) Brother    Varicose Veins Daughter    Esophageal cancer Neg Hx    Stomach cancer Neg Hx    Rectal cancer Neg Hx      SOCIAL HISTORY:  reports that she quit smoking about 5 years ago. Her smoking use included cigarettes. She started smoking about 41 years ago. She has a 9 pack-year smoking history. She has never used smokeless tobacco. She reports that she does not currently use alcohol. She reports that she does not use drugs.  The patient is widowed and lives in [place]. She is visually impaired by her macular degeneration, and while she lives independently, her daughter is very active in her transportation and appointment making.   ALLERGIES: Decadron [dexamethasone], Celebrex [celecoxib], Crestor [rosuvastatin], Doxycycline, Penicillins, Pravastatin, and Hydrocodone-acetaminophen   MEDICATIONS:  Current Outpatient Medications  Medication Sig Dispense Refill   albuterol (PROVENTIL) (2.5 MG/3ML) 0.083% nebulizer solution Take 3 mLs (2.5 mg total) by nebulization every 6 (six) hours as needed for wheezing or shortness of breath. 75 mL 12   albuterol (VENTOLIN HFA) 108 (90 Base) MCG/ACT inhaler 2 Inhalations 15 to 20  minutes apart every 4 hours to Rescue Asthma 18 g 3   Ascorbic Acid (VITAMIN C) 1000 MG tablet Take 1,000 mg by mouth  daily. Takes 7,000mg      aspirin EC 81 MG tablet Take 1 tablet (81 mg total) by mouth daily. 30 tablet 0   Cholecalciferol (VITAMIN D) 50 MCG (2000 UT) CAPS Take by mouth. Takes 6,000 units     ezetimibe (ZETIA) 10 MG tablet Take 1 tablet (10 mg total) by mouth daily. 30 tablet 11   fluticasone-salmeterol (ADVAIR DISKUS) 250-50 MCG/ACT AEPB Inhale 1 puff into the lungs in the morning and at bedtime. 60 each 1   ipratropium-albuterol (DUONEB) 0.5-2.5 (3) MG/3ML SOLN Take 3 mLs by nebulization every 4 (four) hours as needed. Max:6 doses per day 540 mL 0   IRON, FERROUS SULFATE,  PO Take 65 mg by mouth 2 (two) times daily.     omeprazole (PRILOSEC) 20 MG capsule TAKE 1 CAPSULE BY MOUTH TWICE DAILY TO PREVENT INDIGESTION & HEARTBURN 180 capsule 1   ondansetron (ZOFRAN) 8 MG tablet Take 1 tablet (8 mg total) by mouth every 8 (eight) hours as needed for nausea or vomiting. Starting 3 days after chemotherapy 30 tablet 1   prochlorperazine (COMPAZINE) 10 MG tablet Take 1 tablet (10 mg total) by mouth every 6 (six) hours as needed. 30 tablet 2   zinc gluconate 50 MG tablet Take 50 mg by mouth daily.     No current facility-administered medications for this visit.     REVIEW OF SYSTEMS: On review of systems, the patient reports that she is doing okay. She has shortness of breath at baseline as well as a productive cough with phlegm and no hemoptysis. Her baseline weight is 115#, and she has had unintended weight changes now down to 87#. She is able to eat but has a somewhat poor appetite but also early satiety. No complaints of new pain, nausea, fevers, or vomiting are noted. No other complaints are verbalized.     PHYSICAL EXAM:  Unable to assess given encounter type.    ECOG = 1  0 - Asymptomatic (Fully active, able to carry on all predisease activities without restriction)  1 - Symptomatic but completely ambulatory (Restricted in physically strenuous activity but ambulatory and able to carry out  work of a light or sedentary nature. For example, light housework, office work)  2 - Symptomatic, <50% in bed during the day (Ambulatory and capable of all self care but unable to carry out any work activities. Up and about more than 50% of waking hours)  3 - Symptomatic, >50% in bed, but not bedbound (Capable of only limited self-care, confined to bed or chair 50% or more of waking hours)  4 - Bedbound (Completely disabled. Cannot carry on any self-care. Totally confined to bed or chair)  5 - Death   [name] MM, Creech RH, Tormey DC, et al. [phone]). "Toxicity and response criteria of the [facility]". Am. Evlyn Clines. Oncol. 5 (6): 649-55    LABORATORY DATA:  Lab Results  Component Value Date   WBC 3.8 (L) 08/01/2023   HGB 12.9 08/01/2023   HCT 38.2 08/01/2023   MCV 102.7 (H) 08/01/2023   PLT 238 08/01/2023   Lab Results  Component Value Date   NA 136 08/01/2023   K 4.6 08/01/2023   CL 102 08/01/2023   CO2 24 08/01/2023   Lab Results  Component Value Date   ALT 7 08/01/2023   AST 17 08/01/2023   ALKPHOS 83 08/01/2023   BILITOT 0.2 (L) 08/01/2023      RADIOGRAPHY: NM PET Image Initial (PI) Skull Base To Thigh (F-18 FDG)  Result Date: 07/19/2023 CLINICAL DATA:  Initial treatment strategy for RIGHT hilar mass. EXAM: NUCLEAR MEDICINE PET SKULL BASE TO THIGH TECHNIQUE: 4.8 mCi F-18 FDG was injected intravenously. Full-ring PET imaging was performed from the skull base to thigh after the radiotracer. CT data was obtained and used for attenuation correction and anatomic localization. Fasting blood glucose: 1 7 mg/dl COMPARISON:  Chest CT 96/01/5408 choose one FINDINGS: Mediastinal blood pool activity: SUV max 2 Liver activity: SUV max NA NECK: No hypermetabolic lymph nodes in the neck. Incidental CT findings: None. CHEST: Intensely hypermetabolic RIGHT hilar mass measures 2.9 cm (image 70/CT series) with SUV max equal 8.7. Solitary  hypermetabolic RIGHT paratracheal  node measures 14 mm (image 59) with SUV max equal 12 Review of the lung parenchyma demonstrates no hypermetabolic nodule. No axillary lymphadenopathy.  No hypermetabolic breast lesion. Incidental CT findings: Healed posterior LEFT rib fractures ABDOMEN/PELVIS: No abnormal hypermetabolic activity within the liver, pancreas, adrenal glands, or spleen. No hypermetabolic lymph nodes in the abdomen or pelvis. Incidental CT findings: None. SKELETON: No focal hypermetabolic activity to suggest skeletal metastasis. Incidental CT findings: RIGHT hip prosthetic IMPRESSION: 1. Hypermetabolic RIGHT hilar mass is concerning for bronchogenic carcinoma. 2. Hypermetabolic RIGHT paratracheal lymph node is concerning for nodal metastasis. 3. No evidence lung primary. 4. No distant metastatic disease or primary lesion identified. Electronically Signed   By: Genevive Bi M.D.   On: 07/19/2023 14:58   CT Chest W Contrast  Result Date: 07/14/2023 CLINICAL DATA:  Evaluate right hilar adenopathy or mass. EXAM: CT CHEST WITH CONTRAST TECHNIQUE: Multidetector CT imaging of the chest was performed during intravenous contrast administration. RADIATION DOSE REDUCTION: This exam was performed according to the departmental dose-optimization program which includes automated exposure control, adjustment of the mA and/or kV according to patient size and/or use of iterative reconstruction technique. CONTRAST:  75mL ISOVUE-300 IOPAMIDOL (ISOVUE-300) INJECTION 61% COMPARISON:  Lung cancer screening CT from 06/09/2023 FINDINGS: Cardiovascular: The heart size is normal. Aortic atherosclerosis and coronary artery calcifications. No pericardial effusion. Mediastinum/Nodes: Thyroid gland is normal. The trachea is patent and midline. Normal appearance of the esophagus. -Right paratracheal lymph node measures 1.2 cm, image 50/2. -Nodal mass within the right hilum measures 2.7 x 2.0 cm, image 70/2. Lungs/Pleura: Mild to moderate emphysema with diffuse  bronchial wall thickening. Scattered peripheral areas of scarring and subpleural reticulation. Unchanged right middle lobe lung nodule measuring 5 mm, image 117/8. Upper Abdomen: No acute abnormality. Musculoskeletal: No chest wall abnormality. No acute or significant osseous findings. Remote healed left posterolateral rib fractures. Age-indeterminate superior endplate fracture deformity involving the L2 vertebra is unchanged from previous exam. IMPRESSION: 1. Nodal mass within the right hilum measures 2.7 x 2.0 cm. Findings are concerning for malignancy. Recommend further evaluation with PET-CT and tissue sampling. 2. Prominent right paratracheal lymph node measures 1.2 cm. 3. Unchanged right middle lobe lung nodule measuring 5 mm. 4. Emphysema and bronchial wall thickening. 5. Coronary artery calcifications. 6. Aortic Atherosclerosis (ICD10-I70.0) and Emphysema (ICD10-J43.9). Electronically Signed   By: Signa Kell M.D.   On: 07/14/2023 10:55       IMPRESSION/PLAN: 1. Limited Stage Small Cell Carcinoma of the right hilar lung. Dr. Mitzi Hansen discusses the pathology findings and reviews the nature of locally advanced lung cancer. Dr. Mitzi Hansen agrees with recommendations for chemoradiation.  We discussed the risks, benefits, short, and long term effects of radiotherapy, as well as the curative intent, and the patient is interested in proceeding. Dr. Mitzi Hansen discusses the delivery and logistics of radiotherapy and anticipates a course of 6 1/2 weeks of radiotherapy to the right hilar region including regional nodes.  She will simulate tomorrow at which time she will sign consent to proceed. 2. PCI. The patient is aware of the role of prophylactic cranial irradiation. This would be recommended after completing systemic chemotherapy. We will revisit this with her after completing chemoRT and she would need a restaging brain scan prior to proceeding.  3. Visual Impairment. We will coordinate appointments keeping in mind  her daughter's need for this to be considered in her care.   This encounter was conducted via telephone.  The patient has provided two factor  identification and has given verbal consent for this type of encounter and has been advised to only accept a meeting of this type in a secure network environment. The time spent during this encounter was 60 minutes including preparation, discussion, and coordination of the patient's care. The attendants for this meeting include Dr. Mitzi Hansen, Ronny Bacon,  Cheral Marker and her daughter Darica Delillo During the encounter,  Dr. Mitzi Hansen, and Ronny Bacon were located at [facility] Radiation Oncology Department.  [name] was located at home and her daughter [name] was also remotely at home.    The above documentation reflects my direct findings during this shared patient visit. Please see the separate note by Dr. Mitzi Hansen on this date for the remainder of the patient's plan of care.    Osker Mason, [facility]   **Disclaimer: This note was dictated with voice recognition software. Similar sounding words can inadvertently be transcribed and this note may contain transcription errors which may not have been corrected upon publication of note.**

## 2023-08-02 NOTE — Telephone Encounter (Signed)
-----   Message from Cassandra L Heilingoetter sent at 08/02/2023  8:06 AM EDT ----- Can you call her or her daughter and let her know her iron is a little low/low end of normal. I do want her to resume taking iron supplement daily if possible. If she has trouble with the iron she is taking, I can try to send in prescription which may be easier on her stomach. Let me know if she is interested. We can monitor her labs and arrange IV iron if needed but that is a lot of appointments for her so she can try the oral iron first. ----- Message ----- From: Interface, Lab In [facility] Sent: 08/01/2023   2:43 PM EDT To: Johnette Abraham Heilingoetter, PA-C

## 2023-08-03 ENCOUNTER — Telehealth: Payer: Self-pay | Admitting: Internal Medicine

## 2023-08-03 ENCOUNTER — Ambulatory Visit
Admission: RE | Admit: 2023-08-03 | Discharge: 2023-08-03 | Disposition: A | Discharge status: Home / Self Care | Payer: Medicare Other | Source: Ambulatory Visit | Attending: Radiation Oncology | Admitting: Radiation Oncology

## 2023-08-03 DIAGNOSIS — Z87891 Personal history of nicotine dependence: Secondary | ICD-10-CM | POA: Diagnosis not present

## 2023-08-03 DIAGNOSIS — C3491 Malignant neoplasm of unspecified part of right bronchus or lung: Secondary | ICD-10-CM | POA: Insufficient documentation

## 2023-08-03 DIAGNOSIS — Z510 Encounter for antineoplastic radiation therapy: Secondary | ICD-10-CM | POA: Diagnosis not present

## 2023-08-03 DIAGNOSIS — C3401 Malignant neoplasm of right main bronchus: Secondary | ICD-10-CM | POA: Diagnosis not present

## 2023-08-03 DIAGNOSIS — C342 Malignant neoplasm of middle lobe, bronchus or lung: Secondary | ICD-10-CM | POA: Diagnosis not present

## 2023-08-03 NOTE — Telephone Encounter (Signed)
Scheduled per 10/14 los, patient has been called and notified of all upcoming appointments.

## 2023-08-04 ENCOUNTER — Ambulatory Visit (HOSPITAL_COMMUNITY): Payer: Medicare Other

## 2023-08-04 ENCOUNTER — Inpatient Hospital Stay: Payer: Medicare Other

## 2023-08-04 ENCOUNTER — Ambulatory Visit (HOSPITAL_COMMUNITY)
Admission: RE | Admit: 2023-08-04 | Discharge: 2023-08-04 | Disposition: A | Discharge status: Home / Self Care | Payer: Medicare Other | Source: Ambulatory Visit | Attending: Acute Care | Admitting: Acute Care

## 2023-08-04 ENCOUNTER — Other Ambulatory Visit: Payer: Self-pay | Admitting: Physician Assistant

## 2023-08-04 DIAGNOSIS — C3490 Malignant neoplasm of unspecified part of unspecified bronchus or lung: Secondary | ICD-10-CM | POA: Diagnosis not present

## 2023-08-04 DIAGNOSIS — I6782 Cerebral ischemia: Secondary | ICD-10-CM | POA: Diagnosis not present

## 2023-08-04 DIAGNOSIS — G9389 Other specified disorders of brain: Secondary | ICD-10-CM | POA: Diagnosis not present

## 2023-08-04 DIAGNOSIS — D509 Iron deficiency anemia, unspecified: Secondary | ICD-10-CM

## 2023-08-04 MED ORDER — GADOBUTROL 1 MMOL/ML IV SOLN
4.0000 mL | Freq: Once | INTRAVENOUS | Status: AC | PRN
  Administered 2023-08-04: 4 mL via INTRAVENOUS

## 2023-08-05 ENCOUNTER — Inpatient Hospital Stay: Payer: Medicare Other

## 2023-08-05 DIAGNOSIS — C3401 Malignant neoplasm of right main bronchus: Secondary | ICD-10-CM

## 2023-08-05 DIAGNOSIS — E559 Vitamin D deficiency, unspecified: Secondary | ICD-10-CM | POA: Diagnosis not present

## 2023-08-05 DIAGNOSIS — Z5111 Encounter for antineoplastic chemotherapy: Secondary | ICD-10-CM | POA: Diagnosis not present

## 2023-08-05 DIAGNOSIS — Z87891 Personal history of nicotine dependence: Secondary | ICD-10-CM | POA: Diagnosis not present

## 2023-08-05 DIAGNOSIS — C342 Malignant neoplasm of middle lobe, bronchus or lung: Secondary | ICD-10-CM | POA: Diagnosis not present

## 2023-08-05 DIAGNOSIS — K219 Gastro-esophageal reflux disease without esophagitis: Secondary | ICD-10-CM | POA: Diagnosis not present

## 2023-08-05 DIAGNOSIS — Z79899 Other long term (current) drug therapy: Secondary | ICD-10-CM | POA: Diagnosis not present

## 2023-08-05 DIAGNOSIS — J449 Chronic obstructive pulmonary disease, unspecified: Secondary | ICD-10-CM | POA: Diagnosis not present

## 2023-08-05 DIAGNOSIS — Z510 Encounter for antineoplastic radiation therapy: Secondary | ICD-10-CM | POA: Diagnosis not present

## 2023-08-05 DIAGNOSIS — M810 Age-related osteoporosis without current pathological fracture: Secondary | ICD-10-CM | POA: Diagnosis not present

## 2023-08-05 DIAGNOSIS — Z800 Family history of malignant neoplasm of digestive organs: Secondary | ICD-10-CM | POA: Diagnosis not present

## 2023-08-05 LAB — CBC WITH DIFFERENTIAL (CANCER CENTER ONLY)
Abs Immature Granulocytes: 0.01 10*3/uL (ref 0.00–0.07)
Basophils Absolute: 0.1 10*3/uL (ref 0.0–0.1)
Basophils Relative: 1 %
Eosinophils Absolute: 0.1 10*3/uL (ref 0.0–0.5)
Eosinophils Relative: 3 %
HCT: 36.3 % (ref 36.0–46.0)
Hemoglobin: 12.1 g/dL (ref 12.0–15.0)
Immature Granulocytes: 0 %
Lymphocytes Relative: 38 %
Lymphs Abs: 1.4 10*3/uL (ref 0.7–4.0)
MCH: 34.3 pg — ABNORMAL HIGH (ref 26.0–34.0)
MCHC: 33.3 g/dL (ref 30.0–36.0)
MCV: 102.8 fL — ABNORMAL HIGH (ref 80.0–100.0)
Monocytes Absolute: 0.4 10*3/uL (ref 0.1–1.0)
Monocytes Relative: 11 %
Neutro Abs: 1.7 10*3/uL (ref 1.7–7.7)
Neutrophils Relative %: 47 %
Platelet Count: 236 10*3/uL (ref 150–400)
RBC: 3.53 MIL/uL — ABNORMAL LOW (ref 3.87–5.11)
RDW: 15.7 % — ABNORMAL HIGH (ref 11.5–15.5)
WBC Count: 3.7 10*3/uL — ABNORMAL LOW (ref 4.0–10.5)
nRBC: 0 % (ref 0.0–0.2)

## 2023-08-05 LAB — CMP (CANCER CENTER ONLY)
ALT: 7 U/L (ref 0–44)
AST: 14 U/L — ABNORMAL LOW (ref 15–41)
Albumin: 4.1 g/dL (ref 3.5–5.0)
Alkaline Phosphatase: 70 U/L (ref 38–126)
Anion gap: 9 (ref 5–15)
BUN: 8 mg/dL (ref 8–23)
CO2: 23 mmol/L (ref 22–32)
Calcium: 9.3 mg/dL (ref 8.9–10.3)
Chloride: 107 mmol/L (ref 98–111)
Creatinine: 0.71 mg/dL (ref 0.44–1.00)
GFR, Estimated: >60 mL/min (ref >60)
Glucose, Bld: 94 mg/dL (ref 70–99)
Potassium: 4.1 mmol/L (ref 3.5–5.1)
Sodium: 139 mmol/L (ref 135–145)
Total Bilirubin: 0.2 mg/dL — ABNORMAL LOW (ref 0.3–1.2)
Total Protein: 7.1 g/dL (ref 6.5–8.1)

## 2023-08-05 LAB — MAGNESIUM: Magnesium: 1.9 mg/dL (ref 1.7–2.4)

## 2023-08-05 MED FILL — Fosaprepitant Dimeglumine For IV Infusion 150 MG (Base Eq): INTRAVENOUS | Qty: 5 | Status: AC

## 2023-08-06 ENCOUNTER — Other Ambulatory Visit: Payer: Self-pay | Admitting: Physician Assistant

## 2023-08-06 DIAGNOSIS — D509 Iron deficiency anemia, unspecified: Secondary | ICD-10-CM

## 2023-08-06 MED ORDER — POLYSACCHARIDE IRON COMPLEX 150 MG PO CAPS
150.0000 mg | ORAL_CAPSULE | Freq: Every day | ORAL | 2 refills | Status: DC
Start: 2023-08-06 — End: 2023-11-07

## 2023-08-06 NOTE — Telephone Encounter (Signed)
Changed to polysarccharide iron

## 2023-08-08 ENCOUNTER — Inpatient Hospital Stay: Payer: Medicare Other

## 2023-08-08 ENCOUNTER — Other Ambulatory Visit: Payer: Self-pay

## 2023-08-08 ENCOUNTER — Ambulatory Visit
Admission: RE | Admit: 2023-08-08 | Discharge: 2023-08-08 | Disposition: A | Discharge status: Home / Self Care | Payer: Medicare Other | Source: Ambulatory Visit | Attending: Radiation Oncology | Admitting: Radiation Oncology

## 2023-08-08 VITALS — BP 109/63 | HR 88 | Temp 98.0°F | Resp 16 | Wt 88.2 lb

## 2023-08-08 DIAGNOSIS — Z79899 Other long term (current) drug therapy: Secondary | ICD-10-CM | POA: Diagnosis not present

## 2023-08-08 DIAGNOSIS — Z5111 Encounter for antineoplastic chemotherapy: Secondary | ICD-10-CM | POA: Diagnosis not present

## 2023-08-08 DIAGNOSIS — M810 Age-related osteoporosis without current pathological fracture: Secondary | ICD-10-CM | POA: Diagnosis not present

## 2023-08-08 DIAGNOSIS — C3401 Malignant neoplasm of right main bronchus: Secondary | ICD-10-CM

## 2023-08-08 DIAGNOSIS — K219 Gastro-esophageal reflux disease without esophagitis: Secondary | ICD-10-CM | POA: Diagnosis not present

## 2023-08-08 DIAGNOSIS — C342 Malignant neoplasm of middle lobe, bronchus or lung: Secondary | ICD-10-CM | POA: Diagnosis not present

## 2023-08-08 DIAGNOSIS — Z800 Family history of malignant neoplasm of digestive organs: Secondary | ICD-10-CM | POA: Diagnosis not present

## 2023-08-08 DIAGNOSIS — E559 Vitamin D deficiency, unspecified: Secondary | ICD-10-CM | POA: Diagnosis not present

## 2023-08-08 DIAGNOSIS — Z87891 Personal history of nicotine dependence: Secondary | ICD-10-CM | POA: Diagnosis not present

## 2023-08-08 DIAGNOSIS — J449 Chronic obstructive pulmonary disease, unspecified: Secondary | ICD-10-CM | POA: Diagnosis not present

## 2023-08-08 DIAGNOSIS — Z510 Encounter for antineoplastic radiation therapy: Secondary | ICD-10-CM | POA: Diagnosis not present

## 2023-08-08 LAB — RAD ONC ARIA SESSION SUMMARY
Course Elapsed Days: 0
Plan Fractions Treated to Date: 1
Plan Prescribed Dose Per Fraction: 2 Gy
Plan Total Fractions Prescribed: 30
Plan Total Prescribed Dose: 60 Gy
Reference Point Dosage Given to Date: 2 Gy
Reference Point Session Dosage Given: 2 Gy
Session Number: 1

## 2023-08-08 MED ORDER — POTASSIUM CHLORIDE IN NACL 20-0.9 MEQ/L-% IV SOLN
Freq: Once | INTRAVENOUS | Status: AC
  Filled 2023-08-08: qty 1000

## 2023-08-08 MED ORDER — MAGNESIUM SULFATE 2 GM/50ML IV SOLN
2.0000 g | Freq: Once | INTRAVENOUS | Status: AC
  Administered 2023-08-08: 2 g via INTRAVENOUS
  Filled 2023-08-08: qty 50

## 2023-08-08 MED ORDER — FOSAPREPITANT DIMEGLUMINE INJECTION 150 MG
150.0000 mg | Freq: Once | INTRAVENOUS | Status: AC
  Administered 2023-08-08: 150 mg via INTRAVENOUS
  Filled 2023-08-08: qty 150

## 2023-08-08 MED ORDER — PALONOSETRON HCL INJECTION 0.25 MG/5ML
0.2500 mg | Freq: Once | INTRAVENOUS | Status: AC
  Administered 2023-08-08: 0.25 mg via INTRAVENOUS
  Filled 2023-08-08: qty 5

## 2023-08-08 MED ORDER — ACETAMINOPHEN 325 MG PO TABS
650.0000 mg | ORAL_TABLET | Freq: Once | ORAL | Status: AC
  Administered 2023-08-08: 650 mg via ORAL
  Filled 2023-08-08: qty 2

## 2023-08-08 MED ORDER — SODIUM CHLORIDE 0.9 % IV SOLN
100.0000 mg/m2 | Freq: Once | INTRAVENOUS | Status: AC
  Administered 2023-08-08: 132 mg via INTRAVENOUS
  Filled 2023-08-08: qty 6.6

## 2023-08-08 MED ORDER — DEXAMETHASONE SODIUM PHOSPHATE 10 MG/ML IJ SOLN
10.0000 mg | Freq: Once | INTRAMUSCULAR | Status: AC
  Administered 2023-08-08: 10 mg via INTRAVENOUS
  Filled 2023-08-08: qty 1

## 2023-08-08 MED ORDER — SODIUM CHLORIDE 0.9 % IV SOLN: INTRAVENOUS | Status: DC

## 2023-08-08 MED ORDER — SODIUM CHLORIDE 0.9 % IV SOLN
80.0000 mg/m2 | Freq: Once | INTRAVENOUS | Status: AC
  Administered 2023-08-08: 100 mg via INTRAVENOUS
  Filled 2023-08-08: qty 100

## 2023-08-08 NOTE — Progress Notes (Signed)
Pt had c/o headache 8/10 while in infusion today. Pt requested tylenol for headache. This RN made Dr. Arbutus Ped and Tammi RN aware. Per Cassie OK for Pt to receive tylenol 650 mg PO. Orders placed by Tammi RN.

## 2023-08-08 NOTE — Patient Instructions (Addendum)
[name]  Discharge Instructions: Thank you for choosing [facility] to provide your oncology and hematology care.   If you have a lab appointment with the [facility], please go directly to the [facility] and check in at the registration area.   Wear comfortable clothing and clothing appropriate for easy access to any Portacath or PICC line.   We strive to give you quality time with your provider. You may need to reschedule your appointment if you arrive late (15 or more minutes).  Arriving late affects you and other patients whose appointments are after yours.  Also, if you miss three or more appointments without notifying the office, you may be dismissed from the clinic at the provider's discretion.      For prescription refill requests, have your pharmacy contact our office and allow 72 hours for refills to be completed.    Today you received the following chemotherapy and/or immunotherapy agents :  Cisplatin & Etoposide      To help prevent nausea and vomiting after your treatment, we encourage you to take your nausea medication as directed.  BELOW ARE SYMPTOMS THAT SHOULD BE REPORTED IMMEDIATELY: *FEVER GREATER THAN 100.4 F (38 C) OR HIGHER *CHILLS OR SWEATING *NAUSEA AND VOMITING THAT IS NOT CONTROLLED WITH YOUR NAUSEA MEDICATION *UNUSUAL SHORTNESS OF BREATH *UNUSUAL BRUISING OR BLEEDING *URINARY PROBLEMS (pain or burning when urinating, or frequent urination) *BOWEL PROBLEMS (unusual diarrhea, constipation, pain near the anus) TENDERNESS IN MOUTH AND THROAT WITH OR WITHOUT PRESENCE OF ULCERS (sore throat, sores in mouth, or a toothache) UNUSUAL RASH, SWELLING OR PAIN  UNUSUAL VAGINAL DISCHARGE OR ITCHING   Items with * indicate a potential emergency and should be followed up as soon as possible or go to the Emergency Department if any problems should occur.  Please show the CHEMOTHERAPY ALERT CARD or IMMUNOTHERAPY ALERT  CARD at check-in to the Emergency Department and triage nurse.  Should you have questions after your visit or need to cancel or reschedule your appointment, please contact [facility]  Dept: [phone]  and follow the prompts.  Office hours are 8:00 a.m. to 4:30 p.m. Monday - Friday. Please note that voicemails left after 4:00 p.m. may not be returned until the following business day.  We are closed weekends and major holidays. You have access to a nurse at all times for urgent questions. Please call the main number to the clinic Dept: [phone] and follow the prompts.   For any non-urgent questions, you may also contact your provider using MyChart. We now offer e-Visits for anyone 57 and older to request care online for non-urgent symptoms. For details visit [email or web address].   Also download the MyChart app! Go to the app store, search "MyChart", open the app, select [facility], and log in with your MyChart username and password.

## 2023-08-09 ENCOUNTER — Other Ambulatory Visit: Payer: Self-pay

## 2023-08-09 ENCOUNTER — Ambulatory Visit
Admission: RE | Admit: 2023-08-09 | Discharge: 2023-08-09 | Disposition: A | Discharge status: Home / Self Care | Payer: Medicare Other | Source: Ambulatory Visit | Attending: Radiation Oncology

## 2023-08-09 ENCOUNTER — Inpatient Hospital Stay: Payer: Medicare Other

## 2023-08-09 VITALS — BP 126/66 | HR 70 | Temp 98.1°F | Resp 16

## 2023-08-09 DIAGNOSIS — C3401 Malignant neoplasm of right main bronchus: Secondary | ICD-10-CM

## 2023-08-09 DIAGNOSIS — J449 Chronic obstructive pulmonary disease, unspecified: Secondary | ICD-10-CM | POA: Diagnosis not present

## 2023-08-09 DIAGNOSIS — Z5111 Encounter for antineoplastic chemotherapy: Secondary | ICD-10-CM | POA: Diagnosis not present

## 2023-08-09 DIAGNOSIS — C342 Malignant neoplasm of middle lobe, bronchus or lung: Secondary | ICD-10-CM | POA: Diagnosis not present

## 2023-08-09 DIAGNOSIS — Z79899 Other long term (current) drug therapy: Secondary | ICD-10-CM | POA: Diagnosis not present

## 2023-08-09 DIAGNOSIS — Z800 Family history of malignant neoplasm of digestive organs: Secondary | ICD-10-CM | POA: Diagnosis not present

## 2023-08-09 DIAGNOSIS — M810 Age-related osteoporosis without current pathological fracture: Secondary | ICD-10-CM | POA: Diagnosis not present

## 2023-08-09 DIAGNOSIS — E559 Vitamin D deficiency, unspecified: Secondary | ICD-10-CM | POA: Diagnosis not present

## 2023-08-09 DIAGNOSIS — Z87891 Personal history of nicotine dependence: Secondary | ICD-10-CM | POA: Diagnosis not present

## 2023-08-09 DIAGNOSIS — K219 Gastro-esophageal reflux disease without esophagitis: Secondary | ICD-10-CM | POA: Diagnosis not present

## 2023-08-09 DIAGNOSIS — Z510 Encounter for antineoplastic radiation therapy: Secondary | ICD-10-CM | POA: Diagnosis not present

## 2023-08-09 LAB — RAD ONC ARIA SESSION SUMMARY
Course Elapsed Days: 1
Plan Fractions Treated to Date: 2
Plan Prescribed Dose Per Fraction: 2 Gy
Plan Total Fractions Prescribed: 30
Plan Total Prescribed Dose: 60 Gy
Reference Point Dosage Given to Date: 4 Gy
Reference Point Session Dosage Given: 1.7653 Gy
Session Number: 2

## 2023-08-09 MED ORDER — SODIUM CHLORIDE 0.9 % IV SOLN
100.0000 mg/m2 | Freq: Once | INTRAVENOUS | Status: AC
  Administered 2023-08-09: 132 mg via INTRAVENOUS
  Filled 2023-08-09: qty 6.6

## 2023-08-09 MED ORDER — SODIUM CHLORIDE 0.9 % IV SOLN
INTRAVENOUS | Status: DC
Start: 2023-08-09 — End: 2023-08-09

## 2023-08-09 MED ORDER — DEXAMETHASONE SODIUM PHOSPHATE 10 MG/ML IJ SOLN
10.0000 mg | Freq: Once | INTRAMUSCULAR | Status: AC
  Administered 2023-08-09: 10 mg via INTRAVENOUS
  Filled 2023-08-09: qty 1

## 2023-08-09 NOTE — Patient Instructions (Signed)
[facility]  Discharge Instructions: Thank you for choosing [facility] to provide your oncology and hematology care.   If you have a lab appointment with the Cancer Center, please go directly to the Cancer Center and check in at the registration area.   Wear comfortable clothing and clothing appropriate for easy access to any Portacath or PICC line.   We strive to give you quality time with your provider. You may need to reschedule your appointment if you arrive late (15 or more minutes).  Arriving late affects you and other patients whose appointments are after yours.  Also, if you miss three or more appointments without notifying the office, you may be dismissed from the clinic at the provider's discretion.      For prescription refill requests, have your pharmacy contact our office and allow 72 hours for refills to be completed.    Today you received the following chemotherapy and/or immunotherapy agents; Etoposide      To help prevent nausea and vomiting after your treatment, we encourage you to take your nausea medication as directed.  BELOW ARE SYMPTOMS THAT SHOULD BE REPORTED IMMEDIATELY: *FEVER GREATER THAN 100.4 F (38 C) OR HIGHER *CHILLS OR SWEATING *NAUSEA AND VOMITING THAT IS NOT CONTROLLED WITH YOUR NAUSEA MEDICATION *UNUSUAL SHORTNESS OF BREATH *UNUSUAL BRUISING OR BLEEDING *URINARY PROBLEMS (pain or burning when urinating, or frequent urination) *BOWEL PROBLEMS (unusual diarrhea, constipation, pain near the anus) TENDERNESS IN MOUTH AND THROAT WITH OR WITHOUT PRESENCE OF ULCERS (sore throat, sores in mouth, or a toothache) UNUSUAL RASH, SWELLING OR PAIN  UNUSUAL VAGINAL DISCHARGE OR ITCHING   Items with * indicate a potential emergency and should be followed up as soon as possible or go to the Emergency Department if any problems should occur.  Please show the CHEMOTHERAPY ALERT CARD or IMMUNOTHERAPY ALERT CARD at  check-in to the Emergency Department and triage nurse.  Should you have questions after your visit or need to cancel or reschedule your appointment, please contact [facility]  Dept: [phone]  and follow the prompts.  Office hours are 8:00 a.m. to 4:30 p.m. Monday - Friday. Please note that voicemails left after 4:00 p.m. may not be returned until the following business day.  We are closed weekends and major holidays. You have access to a nurse at all times for urgent questions. Please call the main number to the clinic Dept: [phone] and follow the prompts.   For any non-urgent questions, you may also contact your provider using MyChart. We now offer e-Visits for anyone 18 and older to request care online for non-urgent symptoms. For details visit [email or web address].   Also download the MyChart app! Go to the app store, search "MyChart", open the app, select [facility], and log in with your MyChart username and password.   

## 2023-08-10 ENCOUNTER — Inpatient Hospital Stay: Payer: Medicare Other

## 2023-08-10 ENCOUNTER — Ambulatory Visit
Admission: RE | Admit: 2023-08-10 | Discharge: 2023-08-10 | Disposition: A | Discharge status: Home / Self Care | Payer: Medicare Other | Source: Ambulatory Visit | Attending: Radiation Oncology | Admitting: Radiation Oncology

## 2023-08-10 ENCOUNTER — Other Ambulatory Visit: Payer: Self-pay

## 2023-08-10 ENCOUNTER — Telehealth: Payer: Self-pay

## 2023-08-10 VITALS — BP 137/70 | HR 76 | Temp 98.1°F | Resp 18 | Wt 97.1 lb

## 2023-08-10 DIAGNOSIS — C3401 Malignant neoplasm of right main bronchus: Secondary | ICD-10-CM

## 2023-08-10 DIAGNOSIS — Z800 Family history of malignant neoplasm of digestive organs: Secondary | ICD-10-CM | POA: Diagnosis not present

## 2023-08-10 DIAGNOSIS — Z510 Encounter for antineoplastic radiation therapy: Secondary | ICD-10-CM | POA: Diagnosis not present

## 2023-08-10 DIAGNOSIS — M810 Age-related osteoporosis without current pathological fracture: Secondary | ICD-10-CM | POA: Diagnosis not present

## 2023-08-10 DIAGNOSIS — K219 Gastro-esophageal reflux disease without esophagitis: Secondary | ICD-10-CM | POA: Diagnosis not present

## 2023-08-10 DIAGNOSIS — Z87891 Personal history of nicotine dependence: Secondary | ICD-10-CM | POA: Diagnosis not present

## 2023-08-10 DIAGNOSIS — Z79899 Other long term (current) drug therapy: Secondary | ICD-10-CM | POA: Diagnosis not present

## 2023-08-10 DIAGNOSIS — E559 Vitamin D deficiency, unspecified: Secondary | ICD-10-CM | POA: Diagnosis not present

## 2023-08-10 DIAGNOSIS — J449 Chronic obstructive pulmonary disease, unspecified: Secondary | ICD-10-CM | POA: Diagnosis not present

## 2023-08-10 DIAGNOSIS — C342 Malignant neoplasm of middle lobe, bronchus or lung: Secondary | ICD-10-CM | POA: Diagnosis not present

## 2023-08-10 DIAGNOSIS — Z5111 Encounter for antineoplastic chemotherapy: Secondary | ICD-10-CM | POA: Diagnosis not present

## 2023-08-10 LAB — RAD ONC ARIA SESSION SUMMARY
Course Elapsed Days: 2
Plan Fractions Treated to Date: 3
Plan Prescribed Dose Per Fraction: 2 Gy
Plan Total Fractions Prescribed: 30
Plan Total Prescribed Dose: 60 Gy
Reference Point Dosage Given to Date: 6 Gy
Reference Point Session Dosage Given: 2 Gy
Session Number: 3

## 2023-08-10 MED ORDER — DEXAMETHASONE SODIUM PHOSPHATE 10 MG/ML IJ SOLN
10.0000 mg | Freq: Once | INTRAMUSCULAR | Status: AC
  Administered 2023-08-10: 10 mg via INTRAVENOUS
  Filled 2023-08-10: qty 1

## 2023-08-10 MED ORDER — SODIUM CHLORIDE 0.9 % IV SOLN
INTRAVENOUS | Status: DC
Start: 2023-08-10 — End: 2023-08-10

## 2023-08-10 MED ORDER — SODIUM CHLORIDE 0.9 % IV SOLN
100.0000 mg/m2 | Freq: Once | INTRAVENOUS | Status: AC
  Administered 2023-08-10: 132 mg via INTRAVENOUS
  Filled 2023-08-10: qty 6.6

## 2023-08-10 NOTE — Telephone Encounter (Signed)
ENCOUNTER OPENED IN ERROR

## 2023-08-10 NOTE — Patient Instructions (Signed)
[facility]  Discharge Instructions: Thank you for choosing [facility] to provide your oncology and hematology care.   If you have a lab appointment with the Cancer Center, please go directly to the Cancer Center and check in at the registration area.   Wear comfortable clothing and clothing appropriate for easy access to any Portacath or PICC line.   We strive to give you quality time with your provider. You may need to reschedule your appointment if you arrive late (15 or more minutes).  Arriving late affects you and other patients whose appointments are after yours.  Also, if you miss three or more appointments without notifying the office, you may be dismissed from the clinic at the provider's discretion.      For prescription refill requests, have your pharmacy contact our office and allow 72 hours for refills to be completed.    Today you received the following chemotherapy and/or immunotherapy agents; Etoposide      To help prevent nausea and vomiting after your treatment, we encourage you to take your nausea medication as directed.  BELOW ARE SYMPTOMS THAT SHOULD BE REPORTED IMMEDIATELY: *FEVER GREATER THAN 100.4 F (38 C) OR HIGHER *CHILLS OR SWEATING *NAUSEA AND VOMITING THAT IS NOT CONTROLLED WITH YOUR NAUSEA MEDICATION *UNUSUAL SHORTNESS OF BREATH *UNUSUAL BRUISING OR BLEEDING *URINARY PROBLEMS (pain or burning when urinating, or frequent urination) *BOWEL PROBLEMS (unusual diarrhea, constipation, pain near the anus) TENDERNESS IN MOUTH AND THROAT WITH OR WITHOUT PRESENCE OF ULCERS (sore throat, sores in mouth, or a toothache) UNUSUAL RASH, SWELLING OR PAIN  UNUSUAL VAGINAL DISCHARGE OR ITCHING   Items with * indicate a potential emergency and should be followed up as soon as possible or go to the Emergency Department if any problems should occur.  Please show the CHEMOTHERAPY ALERT CARD or IMMUNOTHERAPY ALERT CARD at  check-in to the Emergency Department and triage nurse.  Should you have questions after your visit or need to cancel or reschedule your appointment, please contact [facility]  Dept: [phone]  and follow the prompts.  Office hours are 8:00 a.m. to 4:30 p.m. Monday - Friday. Please note that voicemails left after 4:00 p.m. may not be returned until the following business day.  We are closed weekends and major holidays. You have access to a nurse at all times for urgent questions. Please call the main number to the clinic Dept: [phone] and follow the prompts.   For any non-urgent questions, you may also contact your provider using MyChart. We now offer e-Visits for anyone 18 and older to request care online for non-urgent symptoms. For details visit [email or web address].   Also download the MyChart app! Go to the app store, search "MyChart", open the app, select [facility], and log in with your MyChart username and password.   

## 2023-08-10 NOTE — Telephone Encounter (Deleted)
-----   Message from Nurse Reece Levy sent at 08/08/2023  3:07 PM EDT ----- Regarding: Dr. Arbutus Ped 1st time Cisplatin/Etoposide f/u tol well Dr. Arbutus Ped 1st time Cisplatin/Etoposide, Pt tolerated tx well without incident. Pt call back due on 08/11/2023.

## 2023-08-11 ENCOUNTER — Ambulatory Visit: Payer: Medicare Other

## 2023-08-11 ENCOUNTER — Ambulatory Visit: Payer: Medicare Other | Admitting: Nurse Practitioner

## 2023-08-11 ENCOUNTER — Ambulatory Visit (HOSPITAL_COMMUNITY): Payer: Medicare Other

## 2023-08-11 ENCOUNTER — Telehealth: Payer: Self-pay

## 2023-08-11 NOTE — Progress Notes (Signed)
[facility] OFFICE PROGRESS NOTE  Brenda Cowboy, MD [address] [place] [address]  DIAGNOSIS: Limited stage small cell lung cancer (T1c, N2, M0) small cell lung cancer.  The patient presented with a hilar mass and right paratracheal lymphadenopathy.  She was diagnosed in October 2024.   PRIOR THERAPY: None   CURRENT THERAPY: Systemic chemotherapy with cisplatin on day 1 and etoposide 100 mg/m on days 1, 2, and 3 IV every 3 weeks.  This will be with concurrent radiation.  First dose on 08/08/2023.  INTERVAL HISTORY: [name] 67 y.o. female returns to the clinic today for a follow-up visit accompanied by her daughter.  The patient was recently diagnosed with limited stage small cell lung cancer.  She underwent her first cycle of chemotherapy last week and tolerated it well overall.  She did have some fatigue following treatment and she did have some mouth sores and some white spots in her mouth which did affect her oral intake and she lost approximately 2 pounds.  She has been doing salt water rinses, lozenges, and Biotene mouthwash.  Her mouth soreness is improving somewhat.  They are requesting a prescription for Magic mouthwash.  Her daughter has been good about encouraging the patient to drink Premier protein drinks, yogurt, I boost, PediaSure, chocolate milk, etc.  She is referred to member the nutritionist team and she is expected to see them on 08/29/23 with her next infusion.   She did have some mild nausea but it was controlled with her antiemetics with Compazine and Zofran.  She denies any vomiting.  She does sometimes get choked on phlegm.  She has a history of iron deficiency.  The Integra plus and polysaccharide iron were too expensive.  Therefore, they are going to try and order Slow Fe.   She also started radiation therapy.  She is followed by Dr. Mitzi Hansen and Lysbeth Galas.    She has some baseline dyspnea on exertion which is stable.   She has a baseline cough with stringy white phlegm.  She denies any hemoptysis or chest pain.  She did have some mild constipation followed by 2-3 episodes of diarrhea per day.  It seems that this is slowing down at this time.  She did not need to take any Imodium.  Denies any headaches.  She is having some pain in her right ear.  She states it feels full.  He has a history of macular degeneration and she has not had her eyes evaluated in 2 to 3 years.  However she does feel like her vision may be worse since last week.  Denies any peripheral neuropathy.  She is here today for evaluation and toxicity check.    MEDICAL HISTORY: Past Medical History:  Diagnosis Date   Allergy    Anemia    Arthritis    Asthma    Blood transfusion without reported diagnosis    Cataract    COPD (chronic obstructive pulmonary disease) (HCC)    Duodenal ulcer    Emphysema of lung (HCC)    Family history of malignant neoplasm of gastrointestinal tract    GERD (gastroesophageal reflux disease)    Hepatitis    had in 6th grade pt unsure which kind   Hiatal hernia    HTN (hypertension) 10/03/2014   Hx of colonic polyps    Hyperlipemia    Internal hemorrhoids    Macular degeneration    Osteoporosis    Peripheral vascular disease (HCC)  Pneumonia    hx of   Shortness of breath dyspnea    with ambulation   Stroke [facility])    Vitamin D deficiency     ALLERGIES:  is allergic to decadron [dexamethasone], celebrex [celecoxib], crestor [rosuvastatin], doxycycline, penicillins, pravastatin, and hydrocodone-acetaminophen.  MEDICATIONS:  Current Outpatient Medications  Medication Sig Dispense Refill   albuterol (PROVENTIL) (2.5 MG/3ML) 0.083% nebulizer solution Take 3 mLs (2.5 mg total) by nebulization every 6 (six) hours as needed for wheezing or shortness of breath. 75 mL 12   albuterol (VENTOLIN HFA) 108 (90 Base) MCG/ACT inhaler 2 Inhalations 15 to 20  minutes apart every 4 hours to Rescue Asthma 18 g 3    Ascorbic Acid (VITAMIN C) 1000 MG tablet Take 1,000 mg by mouth daily. Takes 7,000mg      aspirin EC 81 MG tablet Take 1 tablet (81 mg total) by mouth daily. 30 tablet 0   Cholecalciferol (VITAMIN D) 50 MCG (2000 UT) CAPS Take by mouth. Takes 6,000 units     FeFum-FePoly-FA-B Cmp-C-Biot (INTEGRA PLUS) CAPS Take 1 capsule by mouth every morning. 30 capsule 2   fluticasone-salmeterol (ADVAIR DISKUS) 250-50 MCG/ACT AEPB Inhale 1 puff into the lungs in the morning and at bedtime. 60 each 1   ipratropium-albuterol (DUONEB) 0.5-2.5 (3) MG/3ML SOLN Take 3 mLs by nebulization every 4 (four) hours as needed. Max:6 doses per day 540 mL 0   iron polysaccharides (NIFEREX) 150 MG capsule Take 1 capsule (150 mg total) by mouth daily. 30 capsule 2   IRON, FERROUS SULFATE, PO Take 65 mg by mouth 2 (two) times daily.     magnesium oxide (MAG-OX) 400 (240 Mg) MG tablet Take 1 tablet (400 mg total) by mouth daily. 30 tablet 1   omeprazole (PRILOSEC) 20 MG capsule TAKE 1 CAPSULE BY MOUTH TWICE DAILY TO PREVENT INDIGESTION & HEARTBURN 180 capsule 1   ondansetron (ZOFRAN) 8 MG tablet Take 1 tablet (8 mg total) by mouth every 8 (eight) hours as needed for nausea or vomiting. Starting 3 days after chemotherapy 30 tablet 1   prochlorperazine (COMPAZINE) 10 MG tablet Take 1 tablet (10 mg total) by mouth every 6 (six) hours as needed. 30 tablet 2   zinc gluconate 50 MG tablet Take 50 mg by mouth daily.     ezetimibe (ZETIA) 10 MG tablet Take 1 tablet (10 mg total) by mouth daily. 30 tablet 11   magic mouthwash SOLN Take 5 mLs by mouth 3 (three) times daily as needed for mouth pain. Suspension contains equal amounts of Maalox Extra Strength, nystatin, and diphenhydramine. 240 mL 0   No current facility-administered medications for this visit.    SURGICAL HISTORY:  Past Surgical History:  Procedure Laterality Date   ABDOMINAL HYSTERECTOMY     APPENDECTOMY     CATARACT EXTRACTION, BILATERAL     COLONOSCOPY     ELBOW  SURGERY Right    ENDARTERECTOMY Right 12/16/2014   Procedure: ENDARTERECTOMY CAROTID with Dacron patch;  Surgeon: Larina Earthly, MD;  Location: [facility] OR;  Service: Vascular;  Laterality: Right;   ESOPHAGOGASTRODUODENOSCOPY  10/2008   FINE NEEDLE ASPIRATION BIOPSY  07/25/2023   Procedure: FINE NEEDLE ASPIRATION BIOPSY;  Surgeon: Leslye Peer, MD;  Location: [facility] ENDOSCOPY;  Service: Pulmonary;;   HAND SURGERY Left    LUMBAR DISC SURGERY     TOTAL HIP ARTHROPLASTY Right    trigeminal neuralgia surgery     UPPER GASTROINTESTINAL ENDOSCOPY     VIDEO BRONCHOSCOPY WITH ENDOBRONCHIAL ULTRASOUND  N/A 07/25/2023   Procedure: VIDEO BRONCHOSCOPY WITH ENDOBRONCHIAL ULTRASOUND;  Surgeon: Leslye Peer, MD;  Location: [facility] END[facility]COPY;  Service: Pulmonary;  Laterality: N/A;   WRIST SURGERY Left     REVIEW OF SYSTEMS:   Review of Systems  Constitutional: Positive for fatigue and weight loss. Negative for appetite change, chills, and fever.  HENT: Positive for mouth sores. Negative for mouth nosebleeds, sore throat and trouble swallowing.   Eyes: Negative for eye problems and icterus.  Respiratory: Positive for stable dyspnea on exertion and cough. Negative for hemoptysis and wheezing.   Cardiovascular: Negative for chest pain and leg swelling.  Gastrointestinal: Positive for mild nausea, diarrhea, and constipation. Negative for abdominal pain and vomiting.  Genitourinary: Negative for bladder incontinence, difficulty urinating, dysuria, frequency and hematuria.   Musculoskeletal: Negative for back pain, gait problem, neck pain and neck stiffness.  Skin: Negative for itching and rash.  Neurological: Negative for dizziness, extremity weakness, gait problem, headaches, light-headedness and seizures.  Hematological: Negative for adenopathy. Does not bruise/bleed easily.  Psychiatric/Behavioral: Negative for confusion, depression and sleep disturbance. The patient is not nervous/anxious.     PHYSICAL EXAMINATION:   Blood pressure (!) 115/45, pulse 99, temperature 97.7 F (36.5 C), temperature source Tympanic, resp. rate 18, height 5\' 1"  (1.549 m), weight 85 lb 14.4 oz (39 kg), SpO2 100%.  ECOG PERFORMANCE STATUS: 1  Physical Exam  Constitutional: Oriented to person, place, and time and thin appearing female, and in no distress.  HENT:  Head: Normocephalic and atraumatic. Right TM appeared normal. There was only mild cerumen in the EAC. There appeared to be a scab with recent bleeding in the [facility] which may account for the right ear pain.  Mouth/Throat: Oropharynx is clear and moist. No oropharyngeal exudate.  Eyes: Conjunctivae are normal. Right eye exhibits no discharge. Left eye exhibits no discharge. No scleral icterus.  Neck: Normal range of motion. Neck supple.  Cardiovascular: Normal rate, regular rhythm, normal heart sounds and intact distal pulses.   Pulmonary/Chest: Effort normal and breath sounds normal. No respiratory distress. No wheezes. No rales.  Abdominal: Soft. Bowel sounds are normal. Exhibits no distension and no mass. There is no tenderness.  Musculoskeletal: Normal range of motion. Exhibits no edema.  Lymphadenopathy:    No cervical adenopathy.  Neurological: Alert and oriented to person, place, and time. Exhibits muscle wasting. Gait normal. Coordination normal.  Skin: Skin is warm and dry. No rash noted. Not diaphoretic. No erythema. No pallor.  Psychiatric: Mood, memory and judgment normal.  Vitals reviewed.  LABORATORY DATA: Lab Results  Component Value Date   WBC 3.2 (L) 08/15/2023   HGB 10.8 (L) 08/15/2023   HCT 32.1 (L) 08/15/2023   MCV 104.9 (H) 08/15/2023   PLT 100 (L) 08/15/2023      Chemistry      Component Value Date/Time   NA 132 (L) 08/15/2023 1408   NA 141 09/02/2016 1147   K 4.8 08/15/2023 1408   K 4.5 09/02/2016 1147   CL 98 08/15/2023 1408   CO2 27 08/15/2023 1408   CO2 20 (L) 09/02/2016 1147   BUN 19 08/15/2023 1408   BUN 13.1 09/02/2016 1147    CREATININE 0.89 08/15/2023 1408   CREATININE 0.70 05/11/2023 0930   CREATININE 0.8 09/02/2016 1147      Component Value Date/Time   CALCIUM 9.1 08/15/2023 1408   CALCIUM 9.7 09/02/2016 1147   ALKPHOS 84 08/15/2023 1408   ALKPHOS 77 09/02/2016 1147   AST 23 08/15/2023  1408   AST 16 09/02/2016 1147   ALT 13 08/15/2023 1408   ALT 9 09/02/2016 1147   BILITOT 0.2 (L) 08/15/2023 1408   BILITOT <0.22 09/02/2016 1147       RADIOGRAPHIC STUDIES:  MR BRAIN W WO CONTRAST  Result Date: 08/05/2023 CLINICAL DATA:  Provided history: Malignant neoplasm of unspecified part of unspecified bronchus or lung. Small cell lung cancer, staging. EXAM: MRI HEAD WITHOUT AND WITH CONTRAST TECHNIQUE: Multiplanar, multiecho pulse sequences of the brain and surrounding structures were obtained without and with intravenous contrast. CONTRAST:  4mL GADAVIST GADOBUTROL 1 MMOL/ML IV SOLN COMPARISON:  Head CT 09/16/2017.  Brain MRI 08/19/2016. FINDINGS: Brain: No age advanced or lobar predominant parenchymal atrophy. Small focus of chronic encephalomalacia (with associated chronic hemosiderin deposition) in the anterior left temporal lobe at site of prior parenchymal hematoma. Multifocal T2 FLAIR hyperintense signal abnormality within the cerebral white matter and pons, nonspecific but compatible with overall mild-to-moderate for age chronic small vessel ischemic disease. There is no acute infarct. No evidence of an intracranial mass. No extra-axial fluid collection. No midline shift. No pathologic intracranial enhancement identified. Vascular: Maintained flow voids within the proximal large arterial vessels. Skull and upper cervical spine: No focal worrisome marrow lesion. Prior left retrosigmoid craniotomy. Incompletely assessed cervical spondylosis. Sinuses/Orbits: No mass or acute finding within the imaged orbits. Prior bilateral ocular lens replacement. No significant paranasal sinus disease. Other: Trace fluid within  the left mastoid air cells. IMPRESSION: 1. No evidence of intracranial metastatic disease. 2. Chronic small vessel ischemic changes within the cerebral white matter and pons, overall mild-to-moderate for age and slightly progressed from the prior brain MRI of 08/19/2016. 3. Small focus of chronic encephalomalacia (with associated chronic blood products) in the anterior left temporal lobe, at site of prior parenchymal hematoma. Electronically Signed   By: Jackey Loge D.O.   On: 08/05/2023 08:33   NM PET Image Initial (PI) Skull Base To Thigh (F-18 FDG)  Result Date: 07/19/2023 CLINICAL DATA:  Initial treatment strategy for RIGHT hilar mass. EXAM: NUCLEAR MEDICINE PET SKULL BASE TO THIGH TECHNIQUE: 4.8 mCi F-18 FDG was injected intravenously. Full-ring PET imaging was performed from the skull base to thigh after the radiotracer. CT data was obtained and used for attenuation correction and anatomic localization. Fasting blood glucose: 1 7 mg/dl COMPARISON:  Chest CT 57/84/6962 choose one FINDINGS: Mediastinal blood pool activity: SUV max 2 Liver activity: SUV max NA NECK: No hypermetabolic lymph nodes in the neck. Incidental CT findings: None. CHEST: Intensely hypermetabolic RIGHT hilar mass measures 2.9 cm (image 70/CT series) with SUV max equal 8.7. Solitary hypermetabolic RIGHT paratracheal node measures 14 mm (image 59) with SUV max equal 12 Review of the lung parenchyma demonstrates no hypermetabolic nodule. No axillary lymphadenopathy.  No hypermetabolic breast lesion. Incidental CT findings: Healed posterior LEFT rib fractures ABDOMEN/PELVIS: No abnormal hypermetabolic activity within the liver, pancreas, adrenal glands, or spleen. No hypermetabolic lymph nodes in the abdomen or pelvis. Incidental CT findings: None. SKELETON: No focal hypermetabolic activity to suggest skeletal metastasis. Incidental CT findings: RIGHT hip prosthetic IMPRESSION: 1. Hypermetabolic RIGHT hilar mass is concerning for  bronchogenic carcinoma. 2. Hypermetabolic RIGHT paratracheal lymph node is concerning for nodal metastasis. 3. No evidence lung primary. 4. No distant metastatic disease or primary lesion identified. Electronically Signed   By: Genevive Bi M.D.   On: 07/19/2023 14:58     ASSESSMENT/PLAN:  This is a very pleasant 67 year old Caucasian female with newly diagnosed limited stage small  cell lung cancer (T1c, N2, M0) small cell lung cancer.  The patient presented with a hilar mass and right paratracheal lymphadenopathy.  She was diagnosed in October 2024.   She is currently undergoing radiation and chemotherapy with cisplatin on day 1 and etoposide 100 mg/m on days 1, 2, and 3 IV every 3 weeks.  She underwent her first cycle of treatment on 08/08/2023.  The patient had repeat labs today.  Her labs are acceptable.  Magnesium is slightly low and I have sent a prescription for magnesium oxide to take 1 tablet p.o. daily to the pharmacy.  Encouraged the patient to continue with her protein supplemental drinks and hydration.  She is scheduled to see member the nutritionist team at the next appointment.  I have sent in a prescription for Magic mouthwash.  She will continue also using salt water rinses, and Biotene.  She may also consider using Orajel on localized areas of discomfort.  She has been having right ear pain for approximately 1 week without associated hearing loss.  There is no significant cerumen impaction or abnormality of the tympanic membrane.  There is a scab with appearance of recent bleeding in the right ear which may be the source of the patient's pain.  She advised to avoid scratching in the ear.  However she did have hearing changes, I would recommend that she see ENT.  I let them know that cisplatin can cause ototoxicity.  She has a history of macular degeneration.  It would be a good idea to have baseline eye exam prior to her next cisplatin dose should she continue to have visual  changes.   She will continue taking iron supplement.  She is going to purchase Slow Fe.  Recommend she continue on the same treatment at the same dose.  We will see her back for follow-up visit in 2 weeks for evaluation repeat blood work before undergoing cycle #2.   The patient was advised to call immediately if she has any concerning symptoms in the interval. The patient voices understanding of current disease status and treatment options and is in agreement with the current care plan. All questions were answered. The patient knows to call the clinic with any problems, questions or concerns. We can certainly see the patient much sooner if necessary      No orders of the defined types were placed in this encounter.    The total time spent in the appointment was 20-29 minutes  Volney Reierson L Hamsa Laurich, PA-C 08/15/23

## 2023-08-11 NOTE — Telephone Encounter (Signed)
-----   Message from Nurse Reece Levy sent at 08/08/2023  3:07 PM EDT ----- Regarding: Dr. Arbutus Ped 1st time Cisplatin/Etoposide f/u tol well Dr. Arbutus Ped 1st time Cisplatin/Etoposide, Pt tolerated tx well without incident. Pt call back due on 08/11/2023.

## 2023-08-11 NOTE — Telephone Encounter (Signed)
Daughter Carollee Herter states that her mother [name] is doing fine. She is fatigued. She is eating, drinking, and urinating well. She knows to call the office at [phone] if  she has any questions or concerns.

## 2023-08-12 ENCOUNTER — Other Ambulatory Visit: Payer: Self-pay

## 2023-08-12 ENCOUNTER — Ambulatory Visit
Admission: RE | Admit: 2023-08-12 | Discharge: 2023-08-12 | Disposition: A | Discharge status: Home / Self Care | Payer: Medicare Other | Source: Ambulatory Visit | Attending: Radiation Oncology | Admitting: Radiation Oncology

## 2023-08-12 DIAGNOSIS — Z510 Encounter for antineoplastic radiation therapy: Secondary | ICD-10-CM | POA: Diagnosis not present

## 2023-08-12 DIAGNOSIS — C3401 Malignant neoplasm of right main bronchus: Secondary | ICD-10-CM | POA: Diagnosis not present

## 2023-08-12 DIAGNOSIS — Z87891 Personal history of nicotine dependence: Secondary | ICD-10-CM | POA: Diagnosis not present

## 2023-08-12 DIAGNOSIS — C342 Malignant neoplasm of middle lobe, bronchus or lung: Secondary | ICD-10-CM | POA: Diagnosis not present

## 2023-08-12 LAB — RAD ONC ARIA SESSION SUMMARY
Course Elapsed Days: 4
Plan Fractions Treated to Date: 4
Plan Prescribed Dose Per Fraction: 2 Gy
Plan Total Fractions Prescribed: 30
Plan Total Prescribed Dose: 60 Gy
Reference Point Dosage Given to Date: 8 Gy
Reference Point Session Dosage Given: 2 Gy
Session Number: 4

## 2023-08-15 ENCOUNTER — Inpatient Hospital Stay: Payer: Medicare Other | Admitting: Physician Assistant

## 2023-08-15 ENCOUNTER — Other Ambulatory Visit: Payer: Self-pay

## 2023-08-15 ENCOUNTER — Inpatient Hospital Stay: Payer: Medicare Other

## 2023-08-15 ENCOUNTER — Other Ambulatory Visit (HOSPITAL_COMMUNITY): Payer: Self-pay

## 2023-08-15 ENCOUNTER — Ambulatory Visit
Admission: RE | Admit: 2023-08-15 | Discharge: 2023-08-15 | Disposition: A | Discharge status: Home / Self Care | Payer: Medicare Other | Source: Ambulatory Visit | Attending: Radiation Oncology

## 2023-08-15 VITALS — BP 115/45 | HR 99 | Temp 97.7°F | Resp 18 | Ht 61.0 in | Wt 85.9 lb

## 2023-08-15 DIAGNOSIS — K219 Gastro-esophageal reflux disease without esophagitis: Secondary | ICD-10-CM | POA: Diagnosis not present

## 2023-08-15 DIAGNOSIS — M810 Age-related osteoporosis without current pathological fracture: Secondary | ICD-10-CM | POA: Diagnosis not present

## 2023-08-15 DIAGNOSIS — Z510 Encounter for antineoplastic radiation therapy: Secondary | ICD-10-CM | POA: Diagnosis not present

## 2023-08-15 DIAGNOSIS — K1379 Other lesions of oral mucosa: Secondary | ICD-10-CM | POA: Diagnosis not present

## 2023-08-15 DIAGNOSIS — C3401 Malignant neoplasm of right main bronchus: Secondary | ICD-10-CM

## 2023-08-15 DIAGNOSIS — C342 Malignant neoplasm of middle lobe, bronchus or lung: Secondary | ICD-10-CM | POA: Diagnosis not present

## 2023-08-15 DIAGNOSIS — J449 Chronic obstructive pulmonary disease, unspecified: Secondary | ICD-10-CM | POA: Diagnosis not present

## 2023-08-15 DIAGNOSIS — Z5111 Encounter for antineoplastic chemotherapy: Secondary | ICD-10-CM | POA: Diagnosis not present

## 2023-08-15 DIAGNOSIS — Z800 Family history of malignant neoplasm of digestive organs: Secondary | ICD-10-CM | POA: Diagnosis not present

## 2023-08-15 DIAGNOSIS — Z87891 Personal history of nicotine dependence: Secondary | ICD-10-CM | POA: Diagnosis not present

## 2023-08-15 DIAGNOSIS — E559 Vitamin D deficiency, unspecified: Secondary | ICD-10-CM | POA: Diagnosis not present

## 2023-08-15 DIAGNOSIS — Z79899 Other long term (current) drug therapy: Secondary | ICD-10-CM | POA: Diagnosis not present

## 2023-08-15 LAB — CBC WITH DIFFERENTIAL (CANCER CENTER ONLY)
Abs Immature Granulocytes: 0.05 10*3/uL (ref 0.00–0.07)
Basophils Absolute: 0 10*3/uL (ref 0.0–0.1)
Basophils Relative: 1 %
Eosinophils Absolute: 0.1 10*3/uL (ref 0.0–0.5)
Eosinophils Relative: 2 %
HCT: 32.1 % — ABNORMAL LOW (ref 36.0–46.0)
Hemoglobin: 10.8 g/dL — ABNORMAL LOW (ref 12.0–15.0)
Immature Granulocytes: 2 %
Lymphocytes Relative: 26 %
Lymphs Abs: 0.8 10*3/uL (ref 0.7–4.0)
MCH: 35.3 pg — ABNORMAL HIGH (ref 26.0–34.0)
MCHC: 33.6 g/dL (ref 30.0–36.0)
MCV: 104.9 fL — ABNORMAL HIGH (ref 80.0–100.0)
Monocytes Absolute: 0.1 10*3/uL (ref 0.1–1.0)
Monocytes Relative: 2 %
Neutro Abs: 2.2 10*3/uL (ref 1.7–7.7)
Neutrophils Relative %: 67 %
Platelet Count: 100 10*3/uL — ABNORMAL LOW (ref 150–400)
RBC: 3.06 MIL/uL — ABNORMAL LOW (ref 3.87–5.11)
RDW: 14.4 % (ref 11.5–15.5)
Smear Review: NORMAL
WBC Count: 3.2 10*3/uL — ABNORMAL LOW (ref 4.0–10.5)
nRBC: 0 % (ref 0.0–0.2)

## 2023-08-15 LAB — RAD ONC ARIA SESSION SUMMARY
Course Elapsed Days: 7
Plan Fractions Treated to Date: 5
Plan Prescribed Dose Per Fraction: 2 Gy
Plan Total Fractions Prescribed: 30
Plan Total Prescribed Dose: 60 Gy
Reference Point Dosage Given to Date: 10 Gy
Reference Point Session Dosage Given: 2 Gy
Session Number: 5

## 2023-08-15 LAB — CMP (CANCER CENTER ONLY)
ALT: 13 U/L (ref 0–44)
AST: 23 U/L (ref 15–41)
Albumin: 3.8 g/dL (ref 3.5–5.0)
Alkaline Phosphatase: 84 U/L (ref 38–126)
Anion gap: 7 (ref 5–15)
BUN: 19 mg/dL (ref 8–23)
CO2: 27 mmol/L (ref 22–32)
Calcium: 9.1 mg/dL (ref 8.9–10.3)
Chloride: 98 mmol/L (ref 98–111)
Creatinine: 0.89 mg/dL (ref 0.44–1.00)
GFR, Estimated: >60 mL/min (ref >60)
Glucose, Bld: 108 mg/dL — ABNORMAL HIGH (ref 70–99)
Potassium: 4.8 mmol/L (ref 3.5–5.1)
Sodium: 132 mmol/L — ABNORMAL LOW (ref 135–145)
Total Bilirubin: 0.2 mg/dL — ABNORMAL LOW (ref 0.3–1.2)
Total Protein: 6.8 g/dL (ref 6.5–8.1)

## 2023-08-15 LAB — MAGNESIUM: Magnesium: 1.6 mg/dL — ABNORMAL LOW (ref 1.7–2.4)

## 2023-08-15 MED ORDER — NYSTATIN 100000 UNIT/ML MT SUSP
5.0000 mL | Freq: Three times a day (TID) | OROMUCOSAL | 0 refills | Status: DC | PRN
  Filled 2023-08-15: qty 240, 16d supply, fill #0

## 2023-08-15 MED ORDER — MAGIC MOUTHWASH: 5.0000 mL | Freq: Three times a day (TID) | ORAL | 0 refills | Status: DC | PRN

## 2023-08-15 MED ORDER — MAGNESIUM OXIDE -MG SUPPLEMENT 400 (240 MG) MG PO TABS
400.0000 mg | ORAL_TABLET | Freq: Every day | ORAL | 1 refills | Status: DC
  Filled 2023-08-15: qty 30, 30d supply, fill #0

## 2023-08-16 ENCOUNTER — Other Ambulatory Visit (HOSPITAL_COMMUNITY): Payer: Self-pay

## 2023-08-16 ENCOUNTER — Other Ambulatory Visit: Payer: Self-pay

## 2023-08-16 ENCOUNTER — Encounter: Payer: Self-pay | Admitting: Hematology and Oncology

## 2023-08-16 ENCOUNTER — Ambulatory Visit
Admission: RE | Admit: 2023-08-16 | Discharge: 2023-08-16 | Disposition: A | Discharge status: Home / Self Care | Payer: Medicare Other | Source: Ambulatory Visit | Attending: Radiation Oncology

## 2023-08-16 ENCOUNTER — Encounter: Payer: Self-pay | Admitting: Internal Medicine

## 2023-08-16 DIAGNOSIS — Z87891 Personal history of nicotine dependence: Secondary | ICD-10-CM | POA: Diagnosis not present

## 2023-08-16 DIAGNOSIS — C3401 Malignant neoplasm of right main bronchus: Secondary | ICD-10-CM | POA: Diagnosis not present

## 2023-08-16 DIAGNOSIS — Z510 Encounter for antineoplastic radiation therapy: Secondary | ICD-10-CM | POA: Diagnosis not present

## 2023-08-16 DIAGNOSIS — C342 Malignant neoplasm of middle lobe, bronchus or lung: Secondary | ICD-10-CM | POA: Diagnosis not present

## 2023-08-16 LAB — RAD ONC ARIA SESSION SUMMARY
Course Elapsed Days: 8
Plan Fractions Treated to Date: 6
Plan Prescribed Dose Per Fraction: 2 Gy
Plan Total Fractions Prescribed: 30
Plan Total Prescribed Dose: 60 Gy
Reference Point Dosage Given to Date: 12 Gy
Reference Point Session Dosage Given: 2 Gy
Session Number: 6

## 2023-08-17 ENCOUNTER — Ambulatory Visit
Admission: RE | Admit: 2023-08-17 | Discharge: 2023-08-17 | Disposition: A | Discharge status: Home / Self Care | Payer: Medicare Other | Source: Ambulatory Visit | Attending: Radiation Oncology | Admitting: Radiation Oncology

## 2023-08-17 ENCOUNTER — Other Ambulatory Visit: Payer: Self-pay

## 2023-08-17 DIAGNOSIS — Z87891 Personal history of nicotine dependence: Secondary | ICD-10-CM | POA: Diagnosis not present

## 2023-08-17 DIAGNOSIS — C342 Malignant neoplasm of middle lobe, bronchus or lung: Secondary | ICD-10-CM | POA: Diagnosis not present

## 2023-08-17 DIAGNOSIS — Z510 Encounter for antineoplastic radiation therapy: Secondary | ICD-10-CM | POA: Diagnosis not present

## 2023-08-17 DIAGNOSIS — C3401 Malignant neoplasm of right main bronchus: Secondary | ICD-10-CM | POA: Diagnosis not present

## 2023-08-17 LAB — RAD ONC ARIA SESSION SUMMARY
Course Elapsed Days: 9
Plan Fractions Treated to Date: 7
Plan Prescribed Dose Per Fraction: 2 Gy
Plan Total Fractions Prescribed: 30
Plan Total Prescribed Dose: 60 Gy
Reference Point Dosage Given to Date: 14 Gy
Reference Point Session Dosage Given: 2 Gy
Session Number: 7

## 2023-08-18 ENCOUNTER — Other Ambulatory Visit: Payer: Self-pay

## 2023-08-18 ENCOUNTER — Ambulatory Visit
Admission: RE | Admit: 2023-08-18 | Discharge: 2023-08-18 | Disposition: A | Discharge status: Home / Self Care | Payer: Medicare Other | Source: Ambulatory Visit | Attending: Radiation Oncology | Admitting: Radiation Oncology

## 2023-08-18 DIAGNOSIS — Z510 Encounter for antineoplastic radiation therapy: Secondary | ICD-10-CM | POA: Diagnosis not present

## 2023-08-18 DIAGNOSIS — Z87891 Personal history of nicotine dependence: Secondary | ICD-10-CM | POA: Diagnosis not present

## 2023-08-18 DIAGNOSIS — C3401 Malignant neoplasm of right main bronchus: Secondary | ICD-10-CM | POA: Diagnosis not present

## 2023-08-18 DIAGNOSIS — C342 Malignant neoplasm of middle lobe, bronchus or lung: Secondary | ICD-10-CM | POA: Diagnosis not present

## 2023-08-18 LAB — RAD ONC ARIA SESSION SUMMARY
Course Elapsed Days: 10
Plan Fractions Treated to Date: 8
Plan Prescribed Dose Per Fraction: 2 Gy
Plan Total Fractions Prescribed: 30
Plan Total Prescribed Dose: 60 Gy
Reference Point Dosage Given to Date: 16 Gy
Reference Point Session Dosage Given: 2 Gy
Session Number: 8

## 2023-08-19 ENCOUNTER — Other Ambulatory Visit: Payer: Self-pay

## 2023-08-19 ENCOUNTER — Ambulatory Visit
Admission: RE | Admit: 2023-08-19 | Discharge: 2023-08-19 | Disposition: A | Discharge status: Home / Self Care | Payer: Medicare Other | Source: Ambulatory Visit | Attending: Radiation Oncology | Admitting: Radiation Oncology

## 2023-08-19 ENCOUNTER — Other Ambulatory Visit: Payer: Self-pay | Admitting: Radiation Oncology

## 2023-08-19 DIAGNOSIS — Z510 Encounter for antineoplastic radiation therapy: Secondary | ICD-10-CM | POA: Diagnosis not present

## 2023-08-19 DIAGNOSIS — C3401 Malignant neoplasm of right main bronchus: Secondary | ICD-10-CM | POA: Insufficient documentation

## 2023-08-19 DIAGNOSIS — Z87891 Personal history of nicotine dependence: Secondary | ICD-10-CM | POA: Diagnosis not present

## 2023-08-19 DIAGNOSIS — C342 Malignant neoplasm of middle lobe, bronchus or lung: Secondary | ICD-10-CM | POA: Diagnosis not present

## 2023-08-19 LAB — RAD ONC ARIA SESSION SUMMARY
Course Elapsed Days: 11
Plan Fractions Treated to Date: 9
Plan Prescribed Dose Per Fraction: 2 Gy
Plan Total Fractions Prescribed: 30
Plan Total Prescribed Dose: 60 Gy
Reference Point Dosage Given to Date: 18 Gy
Reference Point Session Dosage Given: 2 Gy
Session Number: 9

## 2023-08-19 MED ORDER — SUCRALFATE 1 G PO TABS: 1.0000 g | ORAL_TABLET | Freq: Four times a day (QID) | ORAL | 2 refills | Status: DC

## 2023-08-22 ENCOUNTER — Other Ambulatory Visit: Payer: Self-pay

## 2023-08-22 ENCOUNTER — Ambulatory Visit
Admission: RE | Admit: 2023-08-22 | Discharge: 2023-08-22 | Disposition: A | Discharge status: Home / Self Care | Payer: Medicare Other | Source: Ambulatory Visit | Attending: Radiation Oncology

## 2023-08-22 DIAGNOSIS — Z510 Encounter for antineoplastic radiation therapy: Secondary | ICD-10-CM | POA: Diagnosis not present

## 2023-08-22 DIAGNOSIS — C3401 Malignant neoplasm of right main bronchus: Secondary | ICD-10-CM | POA: Diagnosis not present

## 2023-08-22 DIAGNOSIS — C342 Malignant neoplasm of middle lobe, bronchus or lung: Secondary | ICD-10-CM | POA: Diagnosis not present

## 2023-08-22 DIAGNOSIS — Z87891 Personal history of nicotine dependence: Secondary | ICD-10-CM | POA: Diagnosis not present

## 2023-08-22 LAB — RAD ONC ARIA SESSION SUMMARY
Course Elapsed Days: 14
Plan Fractions Treated to Date: 10
Plan Prescribed Dose Per Fraction: 2 Gy
Plan Total Fractions Prescribed: 30
Plan Total Prescribed Dose: 60 Gy
Reference Point Dosage Given to Date: 20 Gy
Reference Point Session Dosage Given: 2 Gy
Session Number: 10

## 2023-08-23 ENCOUNTER — Inpatient Hospital Stay: Payer: Medicare Other

## 2023-08-23 ENCOUNTER — Telehealth: Payer: Self-pay

## 2023-08-23 ENCOUNTER — Ambulatory Visit: Admission: RE | Admit: 2023-08-23 | Payer: Medicare Other | Source: Ambulatory Visit

## 2023-08-23 NOTE — Telephone Encounter (Signed)
Pt missed port flush with lab appt today.  Rescheduled for 11/6 @ 11:15.  LVM for return call with any questions.

## 2023-08-24 ENCOUNTER — Telehealth: Payer: Self-pay | Admitting: Internal Medicine

## 2023-08-24 ENCOUNTER — Other Ambulatory Visit: Payer: Self-pay

## 2023-08-24 ENCOUNTER — Other Ambulatory Visit: Payer: Self-pay | Admitting: Physician Assistant

## 2023-08-24 ENCOUNTER — Inpatient Hospital Stay: Payer: Medicare Other | Attending: Physician Assistant

## 2023-08-24 ENCOUNTER — Encounter: Payer: Self-pay | Admitting: Internal Medicine

## 2023-08-24 ENCOUNTER — Telehealth: Payer: Self-pay

## 2023-08-24 ENCOUNTER — Ambulatory Visit
Admission: RE | Admit: 2023-08-24 | Discharge: 2023-08-24 | Disposition: A | Discharge status: Home / Self Care | Payer: Medicare Other | Source: Ambulatory Visit | Attending: Radiation Oncology | Admitting: Radiation Oncology

## 2023-08-24 DIAGNOSIS — C3401 Malignant neoplasm of right main bronchus: Secondary | ICD-10-CM | POA: Diagnosis not present

## 2023-08-24 DIAGNOSIS — Z79899 Other long term (current) drug therapy: Secondary | ICD-10-CM | POA: Diagnosis not present

## 2023-08-24 DIAGNOSIS — F17210 Nicotine dependence, cigarettes, uncomplicated: Secondary | ICD-10-CM | POA: Diagnosis not present

## 2023-08-24 DIAGNOSIS — T451X5A Adverse effect of antineoplastic and immunosuppressive drugs, initial encounter: Secondary | ICD-10-CM | POA: Insufficient documentation

## 2023-08-24 DIAGNOSIS — Z510 Encounter for antineoplastic radiation therapy: Secondary | ICD-10-CM | POA: Diagnosis not present

## 2023-08-24 DIAGNOSIS — D709 Neutropenia, unspecified: Secondary | ICD-10-CM | POA: Diagnosis not present

## 2023-08-24 DIAGNOSIS — C342 Malignant neoplasm of middle lobe, bronchus or lung: Secondary | ICD-10-CM | POA: Diagnosis not present

## 2023-08-24 DIAGNOSIS — Z5111 Encounter for antineoplastic chemotherapy: Secondary | ICD-10-CM | POA: Diagnosis not present

## 2023-08-24 DIAGNOSIS — Z87891 Personal history of nicotine dependence: Secondary | ICD-10-CM | POA: Diagnosis not present

## 2023-08-24 LAB — CBC WITH DIFFERENTIAL (CANCER CENTER ONLY)
Abs Immature Granulocytes: 0 K/uL (ref 0.00–0.07)
Basophils Absolute: 0 K/uL (ref 0.0–0.1)
Basophils Relative: 0 %
Eosinophils Absolute: 0.1 K/uL (ref 0.0–0.5)
Eosinophils Relative: 4 %
HCT: 27.6 % — ABNORMAL LOW (ref 36.0–46.0)
Hemoglobin: 9.3 g/dL — ABNORMAL LOW (ref 12.0–15.0)
Immature Granulocytes: 0 %
Lymphocytes Relative: 60 %
Lymphs Abs: 0.9 K/uL (ref 0.7–4.0)
MCH: 35.1 pg — ABNORMAL HIGH (ref 26.0–34.0)
MCHC: 33.7 g/dL (ref 30.0–36.0)
MCV: 104.2 fL — ABNORMAL HIGH (ref 80.0–100.0)
Monocytes Absolute: 0.4 K/uL (ref 0.1–1.0)
Monocytes Relative: 30 %
Neutro Abs: 0.1 K/uL — CL (ref 1.7–7.7)
Neutrophils Relative %: 6 %
Platelet Count: 71 K/uL — ABNORMAL LOW (ref 150–400)
RBC: 2.65 MIL/uL — ABNORMAL LOW (ref 3.87–5.11)
RDW: 13.9 % (ref 11.5–15.5)
Smear Review: NORMAL
WBC Count: 1.4 K/uL — ABNORMAL LOW (ref 4.0–10.5)
nRBC: 0 % (ref 0.0–0.2)

## 2023-08-24 LAB — CMP (CANCER CENTER ONLY)
ALT: 7 U/L (ref 0–44)
AST: 11 U/L — ABNORMAL LOW (ref 15–41)
Albumin: 3.6 g/dL (ref 3.5–5.0)
Alkaline Phosphatase: 89 U/L (ref 38–126)
Anion gap: 5 (ref 5–15)
BUN: 15 mg/dL (ref 8–23)
CO2: 28 mmol/L (ref 22–32)
Calcium: 9.1 mg/dL (ref 8.9–10.3)
Chloride: 105 mmol/L (ref 98–111)
Creatinine: 0.66 mg/dL (ref 0.44–1.00)
GFR, Estimated: >60 mL/min
Glucose, Bld: 82 mg/dL (ref 70–99)
Potassium: 4.5 mmol/L (ref 3.5–5.1)
Sodium: 138 mmol/L (ref 135–145)
Total Bilirubin: 0.1 mg/dL
Total Protein: 6.6 g/dL (ref 6.5–8.1)

## 2023-08-24 LAB — RAD ONC ARIA SESSION SUMMARY
Course Elapsed Days: 16
Plan Fractions Treated to Date: 11
Plan Prescribed Dose Per Fraction: 2 Gy
Plan Total Fractions Prescribed: 30
Plan Total Prescribed Dose: 60 Gy
Reference Point Dosage Given to Date: 22 Gy
Reference Point Session Dosage Given: 2 Gy
Session Number: 11

## 2023-08-24 LAB — MAGNESIUM: Magnesium: 1.9 mg/dL (ref 1.7–2.4)

## 2023-08-24 NOTE — Telephone Encounter (Signed)
Spoke with pt's daughter, Carollee Herter.  Per Cassie, PA-  Pts ANC is low and needs Zarxio injections for the next 3 days.  Informed daughter that someone from scheduling will be calling pt with appts and reviewed neutropenic precautions with her as well. Schedule message sent.  Pt's daughter verbalized understanding.

## 2023-08-24 NOTE — Telephone Encounter (Signed)
Scheduled per 11/06 scheduling message, patient has been called and notified.

## 2023-08-24 NOTE — Progress Notes (Signed)
[facility] OFFICE PROGRESS NOTE  [name], MD [address] [place] [address]  DIAGNOSIS: Limited stage small cell lung cancer (T1c, N2, M0) small cell lung cancer.  The patient presented with a hilar mass and right paratracheal lymphadenopathy.  She was diagnosed in October 2024.   PRIOR THERAPY: None   CURRENT THERAPY: Systemic chemotherapy with cisplatin on day 1 and etoposide 100 mg/m on days 1, 2, and 3 IV every 3 weeks.  This will be with concurrent radiation.  First dose on 08/08/2023.   INTERVAL HISTORY: [name] 67 y.o. female returns to the clinic today for a follow-up visit accompanied by her daughter.  The patient was recently diagnosed with limited stage small cell lung cancer.  She underwent her first cycle of chemotherapy. and tolerated it well overall.  She did have some fatigue following treatment and she did have some mouth sores and some white spots in her mouth which did affect her oral intake and she lost approximately 2 pounds, which she gained back 1 lb. She received magic mouth wash.   She did have a drop in her infection fighting cells last week necessitating short acting G-CSF injections to boost her count.  She did have what they reported as cellulitis and abscess on her left hand for which she underwent drainage and popping last week. Her daughter states this looked like a blister. The patient's daughter states that the heat has improved and it is no longer painful.  She is on a 10-day course of antibiotics with Keflex which she started on Saturday.  She was neutropenic, she denies any other signs of infection including fever, upper respiratory infection symptoms, urinary discomfort, diarrhea, or abdominal pain.  The patient denies any changes in her usual shortness of breath with exertion or her normal cough, although she notes some days are worse than others. She reports no unusual headaches or numbness and  tingling in her hands and feet. However, she has been experiencing low blood pressure and admits to not drinking enough water at home.  At her last appointment she was endorsing some ear pain and did have an excoriation in her ear.  Her ear pain and hearing has improved.  She did have some mild nausea with her first cycle of treatment but it was controlled with her antiemetics with Compazine and Zofran.  She denies any vomiting.    She has a history of iron deficiency.  The Integra plus and polysaccharide iron were too expensive.  Therefore, ordered Slow Fe.   She also started radiation therapy.  She is followed by Dr. Mitzi Hansen and Lysbeth Galas.  She is here today for evaluation and repeat blood work before starting cycle #2.   MEDICAL HISTORY: Past Medical History:  Diagnosis Date   Allergy    Anemia    Arthritis    Asthma    Blood transfusion without reported diagnosis    Cataract    COPD (chronic obstructive pulmonary disease) (HCC)    Duodenal ulcer    Emphysema of lung (HCC)    Family history of malignant neoplasm of gastrointestinal tract    GERD (gastroesophageal reflux disease)    Hepatitis    had in 6th grade pt unsure which kind   Hiatal hernia    HTN (hypertension) 10/03/2014   Hx of colonic polyps    Hyperlipemia    Internal hemorrhoids    Macular degeneration    Osteoporosis    Peripheral vascular  disease (HCC)    Pneumonia    hx of   Shortness of breath dyspnea    with ambulation   Stroke [facility])    Vitamin D deficiency     ALLERGIES:  is allergic to decadron [dexamethasone], celebrex [celecoxib], crestor [rosuvastatin], doxycycline, penicillins, pravastatin, and hydrocodone-acetaminophen.  MEDICATIONS:  Current Outpatient Medications  Medication Sig Dispense Refill   albuterol (PROVENTIL) (2.5 MG/3ML) 0.083% nebulizer solution Take 3 mLs (2.5 mg total) by nebulization every 6 (six) hours as needed for wheezing or shortness of breath. 75 mL 12   albuterol  (VENTOLIN HFA) 108 (90 Base) MCG/ACT inhaler 2 Inhalations 15 to 20  minutes apart every 4 hours to Rescue Asthma 18 g 3   Ascorbic Acid (VITAMIN C) 1000 MG tablet Take 1,000 mg by mouth daily. Takes 7,000mg      aspirin EC 81 MG tablet Take 1 tablet (81 mg total) by mouth daily. 30 tablet 0   Cholecalciferol (VITAMIN D) 50 MCG (2000 UT) CAPS Take by mouth. Takes 6,000 units     ezetimibe (ZETIA) 10 MG tablet Take 1 tablet (10 mg total) by mouth daily. 30 tablet 11   FeFum-FePoly-FA-B Cmp-C-Biot (INTEGRA PLUS) CAPS Take 1 capsule by mouth every morning. 30 capsule 2   fluticasone-salmeterol (ADVAIR DISKUS) 250-50 MCG/ACT AEPB Inhale 1 puff into the lungs in the morning and at bedtime. 60 each 1   ipratropium-albuterol (DUONEB) 0.5-2.5 (3) MG/3ML SOLN Take 3 mLs by nebulization every 4 (four) hours as needed. Max:6 doses per day 540 mL 0   iron polysaccharides (NIFEREX) 150 MG capsule Take 1 capsule (150 mg total) by mouth daily. 30 capsule 2   IRON, FERROUS SULFATE, PO Take 65 mg by mouth 2 (two) times daily.     magic mouthwash (nystatin, diphenhydrAMINE, alum & mag hydroxide) suspension mixture Take 5 mLs by mouth 3 (three) times daily as needed for mouth pain. 240 mL 0   magnesium oxide (MAG-OX) 400 (240 Mg) MG tablet Take 1 tablet (400 mg total) by mouth daily. 30 tablet 1   omeprazole (PRILOSEC) 20 MG capsule TAKE 1 CAPSULE BY MOUTH TWICE DAILY TO PREVENT INDIGESTION & HEARTBURN 180 capsule 1   ondansetron (ZOFRAN) 8 MG tablet Take 1 tablet (8 mg total) by mouth every 8 (eight) hours as needed for nausea or vomiting. Starting 3 days after chemotherapy 30 tablet 1   prochlorperazine (COMPAZINE) 10 MG tablet Take 1 tablet (10 mg total) by mouth every 6 (six) hours as needed. 30 tablet 2   sucralfate (CARAFATE) 1 g tablet Take 1 tablet (1 g total) by mouth 4 (four) times daily. Dissolve each tablet in 15 cc water before use. 120 tablet 2   zinc gluconate 50 MG tablet Take 50 mg by mouth daily.      No current facility-administered medications for this visit.    SURGICAL HISTORY:  Past Surgical History:  Procedure Laterality Date   ABDOMINAL HYSTERECTOMY     APPENDECTOMY     CATARACT EXTRACTION, BILATERAL     COLONOSCOPY     ELBOW SURGERY Right    ENDARTERECTOMY Right 12/16/2014   Procedure: ENDARTERECTOMY CAROTID with Dacron patch;  Surgeon: Larina Earthly, MD;  Location: [facility] OR;  Service: Vascular;  Laterality: Right;   ESOPHAGOGASTRODUODENOSCOPY  10/2008   FINE NEEDLE ASPIRATION BIOPSY  07/25/2023   Procedure: FINE NEEDLE ASPIRATION BIOPSY;  Surgeon: Leslye Peer, MD;  Location: [facility] ENDOSCOPY;  Service: Pulmonary;;   HAND SURGERY Left    LUMBAR  DISC SURGERY     TOTAL HIP ARTHROPLASTY Right    trigeminal neuralgia surgery     UPPER GASTROINTESTINAL ENDOSCOPY     VIDEO BRONCHOSCOPY WITH ENDOBRONCHIAL ULTRASOUND N/A 07/25/2023   Procedure: VIDEO BRONCHOSCOPY WITH ENDOBRONCHIAL ULTRASOUND;  Surgeon: Leslye Peer, MD;  Location: [facility] ENDOSCOPY;  Service: Pulmonary;  Laterality: N/A;   WRIST SURGERY Left     REVIEW OF SYSTEMS:   Review of Systems  Constitutional: Positive for stable fatigue. Negative for appetite change, chills, and fever.  HENT: Negative for mouth nosebleeds, sore throat and trouble swallowing.   Eyes: Negative for eye problems and icterus.  Respiratory: Positive for stable dyspnea on exertion and cough. Negative for hemoptysis and wheezing.   Cardiovascular: Negative for chest pain and leg swelling.  Gastrointestinal: Negative for nausea, vomiting. Negative for abdominal pain and vomiting.  Genitourinary: Negative for bladder incontinence, difficulty urinating, dysuria, frequency and hematuria.   Musculoskeletal: Negative for back pain, gait problem, neck pain and neck stiffness.  Skin: Improving blister/abscess on left hand.  Neurological: Negative for dizziness, extremity weakness, gait problem, headaches, light-headedness and seizures.  Hematological:  Negative for adenopathy. Does not bruise/bleed easily.  Psychiatric/Behavioral: Negative for confusion, depression and sleep disturbance. The patient is not nervous/anxious.   PHYSICAL EXAMINATION:  There were no vitals taken for this visit.  ECOG PERFORMANCE STATUS: 1-2  Physical Exam  Constitutional: Oriented to person, place, and time and thin appearing female, and in no distress.  HENT:  Head: Normocephalic and atraumatic.  Mouth/Throat: Oropharynx is clear and moist. No oropharyngeal exudate.  Eyes: Conjunctivae are normal. Right eye exhibits no discharge. Left eye exhibits no discharge. No scleral icterus.  Neck: Normal range of motion. Neck supple.  Cardiovascular: Normal rate, regular rhythm, normal heart sounds and intact distal pulses.   Pulmonary/Chest: Effort normal and breath sounds normal. No respiratory distress. No wheezes. No rales.  Abdominal: Soft. Bowel sounds are normal. Exhibits no distension and no mass. There is no tenderness.  Musculoskeletal: Normal range of motion. Exhibits no edema.  Lymphadenopathy:    No cervical adenopathy.  Neurological: Alert and oriented to person, place, and time. Exhibits muscle wasting. Gait normal. Coordination normal.  Skin: Skin is warm and dry.  The prior blister on her left hand is improving but is still healing.  No drainage and the patient's daughter denies any more heat or pain.  Not diaphoretic.No pallor.  Psychiatric: Mood, memory and judgment normal.  Vitals reviewed.  LABORATORY DATA: Lab Results  Component Value Date   WBC 3.2 (L) 08/15/2023   HGB 10.8 (L) 08/15/2023   HCT 32.1 (L) 08/15/2023   MCV 104.9 (H) 08/15/2023   PLT 100 (L) 08/15/2023      Chemistry      Component Value Date/Time   NA 132 (L) 08/15/2023 1408   NA 141 09/02/2016 1147   K 4.8 08/15/2023 1408   K 4.5 09/02/2016 1147   CL 98 08/15/2023 1408   CO2 27 08/15/2023 1408   CO2 20 (L) 09/02/2016 1147   BUN 19 08/15/2023 1408   BUN 13.1  09/02/2016 1147   CREATININE 0.89 08/15/2023 1408   CREATININE 0.70 05/11/2023 0930   CREATININE 0.8 09/02/2016 1147      Component Value Date/Time   CALCIUM 9.1 08/15/2023 1408   CALCIUM 9.7 09/02/2016 1147   ALKPHOS 84 08/15/2023 1408   ALKPHOS 77 09/02/2016 1147   AST 23 08/15/2023 1408   AST 16 09/02/2016 1147   ALT 13 08/15/2023  1408   ALT 9 09/02/2016 1147   BILITOT 0.2 (L) 08/15/2023 1408   BILITOT <0.22 09/02/2016 1147       RADIOGRAPHIC STUDIES:  MR BRAIN W WO CONTRAST  Result Date: 08/05/2023 CLINICAL DATA:  Provided history: Malignant neoplasm of unspecified part of unspecified bronchus or lung. Small cell lung cancer, staging. EXAM: MRI HEAD WITHOUT AND WITH CONTRAST TECHNIQUE: Multiplanar, multiecho pulse sequences of the brain and surrounding structures were obtained without and with intravenous contrast. CONTRAST:  4mL GADAVIST GADOBUTROL 1 MMOL/ML IV SOLN COMPARISON:  Head CT 09/16/2017.  Brain MRI 08/19/2016. FINDINGS: Brain: No age advanced or lobar predominant parenchymal atrophy. Small focus of chronic encephalomalacia (with associated chronic hemosiderin deposition) in the anterior left temporal lobe at site of prior parenchymal hematoma. Multifocal T2 FLAIR hyperintense signal abnormality within the cerebral white matter and pons, nonspecific but compatible with overall mild-to-moderate for age chronic small vessel ischemic disease. There is no acute infarct. No evidence of an intracranial mass. No extra-axial fluid collection. No midline shift. No pathologic intracranial enhancement identified. Vascular: Maintained flow voids within the proximal large arterial vessels. Skull and upper cervical spine: No focal worrisome marrow lesion. Prior left retrosigmoid craniotomy. Incompletely assessed cervical spondylosis. Sinuses/Orbits: No mass or acute finding within the imaged orbits. Prior bilateral ocular lens replacement. No significant paranasal sinus disease. Other:  Trace fluid within the left mastoid air cells. IMPRESSION: 1. No evidence of intracranial metastatic disease. 2. Chronic small vessel ischemic changes within the cerebral white matter and pons, overall mild-to-moderate for age and slightly progressed from the prior brain MRI of 08/19/2016. 3. Small focus of chronic encephalomalacia (with associated chronic blood products) in the anterior left temporal lobe, at site of prior parenchymal hematoma. Electronically Signed   By: Jackey Loge D.O.   On: 08/05/2023 08:33     ASSESSMENT/PLAN:  This is a very pleasant 67 year old Caucasian female with newly diagnosed limited stage small cell lung cancer (T1c, N2, M0) small cell lung cancer.  The patient presented with a hilar mass and right paratracheal lymphadenopathy.  She was diagnosed in October 2024.    She is currently undergoing radiation and chemotherapy with cisplatin on day 1 and etoposide 100 mg/m on days 1, 2, and 3 IV every 3 weeks.  She underwent her first cycle of treatment on 08/08/2023.  She did require GCSF injections in the interval. Dr. Arbutus Ped would not recommend long acting GCSF due to the risk of pneumonitis and would like to continue with short acting GCSF as needed.   I reviewed the patient's cellulitis/blister/abscess on her right hand and her neutropenia with her labs last week.  Her neutropenia has improved.  She is on a 10-day course of antibiotics.  Given her chemotherapy and need for further improvement of the lesion on her hand, he recommended delaying treatment by 1 week.  As long as she does not have any major changes in her health next week, she does not need to see Dr. Arbutus Ped and I next week prior to proceeding with cycle #2.  She was cautioned what to monitor for for any worsening symptoms such as increased hand pain, swelling, erythema, drainage, or systemic symptoms such as fevers.  She will continue with her iron supplement.  I have added sample blood bank's to be  drawn on a weekly basis should she ever require a blood transfusion.  We would set her blood transfusion if her hemoglobin were less than 8.  She was supposed to see member  the nutritionist team while in the infusion room today.  I will reach out to them about rescheduling or seeing if they can call her for a telephone visit.  Her blood pressure is a little bit soft today.  She is advised to hydrate well with water at home.  She is not taking any anti-hypertensives.  We will see her back in 4 weeks for evaluation and repeat blood work before undergoing cycle #3.   The patient was advised to call immediately if she has any concerning symptoms in the interval. The patient voices understanding of current disease status and treatment options and is in agreement with the current care plan. All questions were answered. The patient knows to call the clinic with any problems, questions or concerns. We can certainly see the patient much sooner if necessary   No orders of the defined types were placed in this encounter.   The total time spent in the appointment was 20-29 minutes  Exodus Kutzer L Emalia Witkop, PA-C 08/24/23

## 2023-08-25 ENCOUNTER — Encounter: Payer: Self-pay | Admitting: Nurse Practitioner

## 2023-08-25 ENCOUNTER — Ambulatory Visit: Payer: Medicare Other | Admitting: Nurse Practitioner

## 2023-08-25 ENCOUNTER — Telehealth: Payer: Self-pay | Admitting: *Deleted

## 2023-08-25 ENCOUNTER — Inpatient Hospital Stay: Payer: Medicare Other

## 2023-08-25 ENCOUNTER — Ambulatory Visit: Payer: Medicare Other

## 2023-08-25 ENCOUNTER — Other Ambulatory Visit: Payer: Self-pay | Admitting: Radiation Oncology

## 2023-08-25 VITALS — BP 124/48 | Resp 18

## 2023-08-25 VITALS — BP 110/74 | HR 104 | Temp 98.1°F | Ht 61.0 in | Wt 86.6 lb

## 2023-08-25 DIAGNOSIS — D701 Agranulocytosis secondary to cancer chemotherapy: Secondary | ICD-10-CM

## 2023-08-25 DIAGNOSIS — L02512 Cutaneous abscess of left hand: Secondary | ICD-10-CM | POA: Diagnosis not present

## 2023-08-25 DIAGNOSIS — Z5111 Encounter for antineoplastic chemotherapy: Secondary | ICD-10-CM | POA: Diagnosis not present

## 2023-08-25 DIAGNOSIS — C3401 Malignant neoplasm of right main bronchus: Secondary | ICD-10-CM

## 2023-08-25 DIAGNOSIS — Z79899 Other long term (current) drug therapy: Secondary | ICD-10-CM | POA: Diagnosis not present

## 2023-08-25 DIAGNOSIS — F17210 Nicotine dependence, cigarettes, uncomplicated: Secondary | ICD-10-CM | POA: Diagnosis not present

## 2023-08-25 DIAGNOSIS — D508 Other iron deficiency anemias: Secondary | ICD-10-CM

## 2023-08-25 DIAGNOSIS — D709 Neutropenia, unspecified: Secondary | ICD-10-CM | POA: Diagnosis not present

## 2023-08-25 DIAGNOSIS — L03119 Cellulitis of unspecified part of limb: Secondary | ICD-10-CM | POA: Diagnosis not present

## 2023-08-25 MED ORDER — CEPHALEXIN 500 MG PO CAPS
500.0000 mg | ORAL_CAPSULE | Freq: Three times a day (TID) | ORAL | 0 refills | Status: AC
Start: 2023-08-25 — End: 2023-09-04

## 2023-08-25 MED ORDER — FILGRASTIM-SNDZ 300 MCG/0.5ML IJ SOSY
300.0000 ug | PREFILLED_SYRINGE | Freq: Once | INTRAMUSCULAR | Status: AC
  Administered 2023-08-25: 300 ug via SUBCUTANEOUS
  Filled 2023-08-25: qty 0.5

## 2023-08-25 NOTE — Patient Instructions (Addendum)
Start Keflex 500 mg three times a day Keep area clean and dry Have reevaluated at chemotherapy session on Monday  If pain worsens, develop a fever or drainage with odor occurs go to the ER  Cellulitis, Adult  Cellulitis is a skin infection. The infected area is usually warm, red, swollen, and tender. It most commonly occurs on the lower body, such as the legs, feet, and toes, but this condition can occur on any part of the body. The infection can travel to the muscles, blood, and underlying tissue and become life-threatening without treatment. It is important to get medical treatment right away for this condition. What are the causes? Cellulitis is caused by bacteria. The bacteria enter through a break in the skin, such as a cut, burn, insect or animal bite, open sore, or crack. What increases the risk? This condition is more likely to occur in people who: Have a weak body's defense system (immune system). Are older than 65 years old. Have diabetes. Have a type of long-term (chronic) liver disease (cirrhosis) or kidney disease. Are obese. Have a skin condition such as: An itchy rash, such as eczema or psoriasis. A fungal rash on the feet or in skinfolds. Blistering rashes, such as shingles or chickenpox. Slow movement of blood in the veins (venous stasis). Fluid buildup below the skin (edema). Have open wounds on the skin, such as cuts, puncture wounds, burns, bites, scrapes, tattoos, piercings, or wounds from surgery. Have had radiation therapy. Use IV drugs. What are the signs or symptoms? Symptoms of this condition include: Skin that looks red, purple, or slightly darker than your usual skin color. Streaks or spots on the skin. Swollen area of the skin. Tenderness or pain when an area of the skin is touched. Warm skin. Fever or chills. Blisters. Tiredness (fatigue). How is this diagnosed? This condition is diagnosed based on a medical history and physical exam. You may also  have tests, including: Blood tests. Imaging tests. Tests on a sample of fluid taken from the wound (wound culture). How is this treated? Treatment for this condition may include: Medicines. These may include antibiotics or medicines to treat allergies (antihistamines). Rest. Applying cold or warm wet cloths (compresses) to the skin. If the condition is severe, you may need to stay in the hospital and get antibiotics through an IV. The infection usually starts to get better within 1-2 days of treatment. Follow these instructions at home: Medicines Take over-the-counter and prescription medicines only as told by your health care provider. If you were prescribed antibiotics, take them as told by your provider. Do not stop using the antibiotic even if you start to feel better. General instructions Drink enough fluid to keep your pee (urine) pale yellow. Do not touch or rub the infected area. Raise (elevate) the infected area above the level of your heart while you are sitting or lying down. Return to your normal activities as told by your provider. Ask your provider what activities are safe for you. Apply warm or cold compresses to the affected area as told by your provider. Keep all follow-up visits. Your provider will need to make sure that a more serious infection is not developing. Contact a health care provider if: You have a fever. Your symptoms do not improve within 1-2 days of starting treatment or you develop new symptoms. Your bone or joint underneath the infected area becomes painful after the skin has healed. Your infection returns in the same area or another area. Signs of this  may include: You notice a swollen bump in the infected area. Your red area gets larger, turns dark in color, or becomes more painful. Drainage increases. Pus or a bad smell develops in your infected area. You have more pain. You feel ill and have muscle aches and weakness. You develop vomiting or  diarrhea that will not go away. Get help right away if: You notice red streaks coming from the infected area. You notice the skin turns purple or black and falls off. This symptom may be an emergency. Get help right away. Call 911. Do not wait to see if the symptom will go away. Do not drive yourself to the hospital. This information is not intended to replace advice given to you by your health care provider. Make sure you discuss any questions you have with your health care provider. Document Revised: 06/01/2022 Document Reviewed: 06/01/2022 Elsevier Patient Education  2024 [employer].

## 2023-08-25 NOTE — Progress Notes (Signed)
Assessment and Plan:  Brenda "Marcelino Duster" was seen today for hand pain.  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  Start Keflex 500 mg three times a day Keep area clean and dry Have reevaluated at chemotherapy session on Monday If pain worsens, develop a fever or drainage with odor occurs go to the ER -     cephALEXin (KEFLEX) 500 MG capsule; Take 1 capsule (500 mg total) by mouth 3 (three) times daily for 10 days.         Further disposition pending results of labs. Discussed med's effects and SE's.   Over 30 minutes of exam, counseling, chart review, and critical decision making was performed.   Future Appointments  Date Time Provider Department Center  08/26/2023  8:00 AM [facility] LAB [facility] None  08/26/2023  8:30 AM [facility] LINAC 1 [facility] None  08/26/2023  8:45 AM LINAC-MOODY [facility] None  08/26/2023  9:15 AM [facility] FLUSH [facility] None  08/27/2023  8:30 AM [facility] FLUSH [facility] None  08/29/2023  7:30 AM [facility] LINAC 1 [facility] None  08/29/2023  8:30 AM [facility] LAB [facility] None  08/29/2023  9:00 AM Heilingoetter, Cassandra L, PA-C [facility] None  08/29/2023 10:00 AM [facility] INFUSION [facility] None  08/29/2023 11:15 AM Neff, Barbara L, RD [facility] None  08/30/2023  8:30 AM [facility] INFUSION [facility] None  08/30/2023 11:45 AM [facility] LINAC 1 [facility] None  08/31/2023 10:30 AM [facility] LINAC 1 [facility] None  08/31/2023 12:15 PM [facility] INFUSION [facility] None  09/01/2023 11:25 AM [facility] LINAC 1 [facility] None  09/02/2023 11:15 AM [facility] LINAC 1 [facility] None  09/05/2023 11:25 AM [facility] LINAC 1 [facility] None  09/06/2023  10:45 AM [facility] LAB [facility] None  09/06/2023 11:25 AM [facility] LINAC 1 [facility] None  09/07/2023 10:15 AM [facility] LINAC 1 [facility] None  09/08/2023 10:15 AM [facility] LINAC 1 [facility] None  09/09/2023 10:15 AM [facility] LINAC 1 [facility] None  09/11/2023  7:00 AM [facility] LINAC 1 [facility] None  09/12/2023 11:25 AM [facility] LINAC 1 [facility] None  09/13/2023 10:45 AM [facility] LAB [facility] None  09/13/2023 11:25 AM [facility] LINAC 1 [facility] None  09/14/2023 11:25 AM [facility] LINAC 1 [facility] None  09/19/2023  8:30 AM [facility] LAB [facility] None  09/19/2023  9:00 AM Heilingoetter, Cassandra L, PA-C [facility] None  09/19/2023  9:45 AM [facility] LINAC 1 [facility] None  09/19/2023 10:00 AM [facility] INFUSION [facility] None  09/19/2023  2:30 PM Neff, Barbara L, RD [facility] None  09/20/2023 10:15 AM [facility] LINAC 1 [facility] None  09/20/2023  1:00 PM [facility] INFUSION [facility] None  09/21/2023 10:15 AM [facility] LINAC 1 [facility] None  09/21/2023 11:00 AM [facility] INFUSION [facility] None  09/22/2023 10:15 AM [facility] LINAC 1 [facility] None  09/23/2023 11:30 AM [facility] LINAC 1 [facility] None  09/26/2023 11:30 AM [facility] LINAC 1 [facility] None  09/27/2023 11:30 AM [facility] LINAC 1 [facility] None  09/27/2023  2:15 PM [facility] LAB [facility] None  10/10/2023  8:30 AM [facility] LAB [facility] None  10/10/2023  9:00 AM Si Gaul, MD [facility] None  10/10/2023 10:00 AM [facility] INFUSION [facility] None  12/20/2023  2:00 PM [facility] DX DEXA 1 [facility]-BCGDG GI-BREAST CE  05/10/2024 10:00 AM [name], NP [facility] None    ------------------------------------------------------------------------------------------------------------------   HPI BP 110/74  Pulse (!) 104   Temp 98.1 F (36.7 C)   Ht 5\' 1"  (1.549 m)   Wt 86 lb 9.6 oz (39.3 kg)   SpO2 99%   BMI 16.36  kg/m   67 y.o.female presents for left hand blister that started 3 days ago and has increased in size, became more red and tender.   She is undergoing chemotherapy and radiation for small cell lung cancer in left lung but was held today because of large tender erythematous blister on left hand and low blood count. She does not remember any injury to the area or any insect bites.   BP well controlled without medication BP Readings from Last 3 Encounters:  08/25/23 110/74  08/25/23 (!) 124/48  08/15/23 (!) 115/45  Denies headaches, chest pain, shortness of breath and dizziness   BMI is Body mass index is 16.36 kg/m., she has not been working on diet and exercise. Wt Readings from Last 3 Encounters:  08/25/23 86 lb 9.6 oz (39.3 kg)  08/15/23 85 lb 14.4 oz (39 kg)  08/10/23 97 lb 1.9 oz (44.1 kg)     Past Medical History:  Diagnosis Date   Allergy    Anemia    Arthritis    Asthma    Blood transfusion without reported diagnosis    Cataract    COPD (chronic obstructive pulmonary disease) (HCC)    Duodenal ulcer    Emphysema of lung (HCC)    Family history of malignant neoplasm of gastrointestinal tract    GERD (gastroesophageal reflux disease)    Hepatitis    had in 6th grade pt unsure which kind   Hiatal hernia    HTN (hypertension) 10/03/2014   Hx of colonic polyps    Hyperlipemia    Internal hemorrhoids    Macular degeneration    Osteoporosis    Peripheral vascular disease (HCC)    Pneumonia    hx of   Shortness of breath dyspnea    with ambulation   Stroke (HCC)    Vitamin D deficiency      Allergies  Allergen Reactions   Decadron [Dexamethasone] Other (See Comments)    Stomach issues, history of GI bleed   Celebrex [Celecoxib] Other (See Comments)    GI bleed   Crestor [Rosuvastatin] Other (See Comments)    Made legs hurt   Doxycycline Other (See Comments)    Stomach upset    Penicillins Other (See Comments)    Has patient had a PCN reaction causing  immediate rash, facial/tongue/throat swelling, SOB or lightheadedness with hypotension: Yes Has patient had a PCN reaction causing severe rash involving mucus membranes or skin necrosis: No Has patient had a PCN reaction that required hospitalization: No Has patient had a PCN reaction occurring within the last 10 years: Yes If all of the above answers are "NO", then may proceed with Cephalosporin use.   REACTION: whelps   Pravastatin Other (See Comments)    Muscle aches   Hydrocodone-Acetaminophen Hives, Itching and Rash    Current Outpatient Medications on File Prior to Visit  Medication Sig   albuterol (PROVENTIL) (2.5 MG/3ML) 0.083% nebulizer solution Take 3 mLs (2.5 mg total) by nebulization every 6 (six) hours as needed for wheezing or shortness of breath.   albuterol (VENTOLIN HFA) 108 (90 Base) MCG/ACT inhaler 2 Inhalations 15 to 20  minutes apart every 4 hours to Rescue Asthma   Ascorbic Acid (VITAMIN C) 1000 MG tablet Take 1,000 mg by mouth daily. Takes 7,000mg   aspirin EC 81 MG tablet Take 1 tablet (81 mg total) by mouth daily.   Cholecalciferol (VITAMIN D) 50 MCG (2000 UT) CAPS Take by mouth. Takes 6,000 units   ezetimibe (ZETIA) 10 MG tablet Take 1 tablet (10 mg total) by mouth daily.   FeFum-FePoly-FA-B Cmp-C-Biot (INTEGRA PLUS) CAPS Take 1 capsule by mouth every morning.   fluticasone-salmeterol (ADVAIR DISKUS) 250-50 MCG/ACT AEPB Inhale 1 puff into the lungs in the morning and at bedtime.   ipratropium-albuterol (DUONEB) 0.5-2.5 (3) MG/3ML SOLN Take 3 mLs by nebulization every 4 (four) hours as needed. Max:6 doses per day   iron polysaccharides (NIFEREX) 150 MG capsule Take 1 capsule (150 mg total) by mouth daily.   IRON, FERROUS SULFATE, PO Take 65 mg by mouth 2 (two) times daily.   magic mouthwash (nystatin, diphenhydrAMINE, alum & mag hydroxide) suspension mixture Take 5 mLs by mouth 3 (three) times daily as needed for mouth pain.   magnesium oxide (MAG-OX) 400 (240 Mg)  MG tablet Take 1 tablet (400 mg total) by mouth daily.   omeprazole (PRILOSEC) 20 MG capsule TAKE 1 CAPSULE BY MOUTH TWICE DAILY TO PREVENT INDIGESTION & HEARTBURN   ondansetron (ZOFRAN) 8 MG tablet Take 1 tablet (8 mg total) by mouth every 8 (eight) hours as needed for nausea or vomiting. Starting 3 days after chemotherapy   prochlorperazine (COMPAZINE) 10 MG tablet Take 1 tablet (10 mg total) by mouth every 6 (six) hours as needed.   sucralfate (CARAFATE) 1 g tablet Take 1 tablet (1 g total) by mouth 4 (four) times daily. Dissolve each tablet in 15 cc water before use.   zinc gluconate 50 MG tablet Take 50 mg by mouth daily.   No current facility-administered medications on file prior to visit.    ROS: all negative except above.   Physical Exam:  BP 110/74   Pulse (!) 104   Temp 98.1 F (36.7 C)   Ht 5\' 1"  (1.549 m)   Wt 86 lb 9.6 oz (39.3 kg)   SpO2 99%   BMI 16.36 kg/m   General Appearance: Cachectic frail female who appears in pain Eyes: PERRLA, EOMs, conjunctiva no swelling or erythema Neck: Supple, thyroid normal.  Respiratory: Respiratory effort normal, BS slightly diminished at bases, no wheezing or crackles Cardio: RRR with no MRGs. Brisk peripheral pulses without edema.  Abdomen: Soft, + BS.   Musculoskeletal: Full ROM, 5/5 strength, slow steady gait.  Skin: Warm, dry The patient left the office before the visit was finished. Left hand has 1.5 cm blister on lateral side of palm, erythema surrounding and is warm, very tender to touch. The blistered area was opened after being cleaned with betadine with sterile scissors- clear drainage from the area. Cleansed with peroxide and betadine sugar and band aid dressing applied.  Neuro: Cranial nerves intact. Normal muscle tone, no cerebellar symptoms. Sensation intact.  Psych: Awake and oriented X 3, normal affect, Insight and Judgment appropriate.     [name], NP 1:49 PM [facility]

## 2023-08-25 NOTE — Patient Instructions (Signed)
Filgrastim Injection What is this medication? FILGRASTIM (fil GRA stim) lowers the risk of infection in people who are receiving chemotherapy. It works by helping your body make more white blood cells, which protects your body from infection. It may also be used to help people who have been exposed to high doses of radiation. It can be used to help prepare your body before a stem cell transplant. It works by helping your bone marrow make and release stem cells into the blood. This medicine may be used for other purposes; ask your health care provider or pharmacist if you have questions. COMMON BRAND NAME(S): Neupogen, Nivestym, Releuko, Zarxio What should I tell my care team before I take this medication? They need to know if you have any of these conditions: History of blood diseases, such as sickle cell anemia Kidney disease Recent or ongoing radiation An unusual or allergic reaction to filgrastim, pegfilgrastim, latex, rubber, other medications, foods, dyes, or preservatives Pregnant or trying to get pregnant Breast-feeding How should I use this medication? This medication is injected under the skin or into a vein. It is usually given by your care team in a hospital or clinic setting. It may be given at home. If you get this medication at home, you will be taught how to prepare and give it. Use exactly as directed. Take it as directed on the prescription label at the same time every day. Keep taking it unless your care team tells you to stop. It is important that you put your used needles and syringes in a special sharps container. Do not put them in a trash can. If you do not have a sharps container, call your pharmacist or care team to get one. This medication comes with INSTRUCTIONS FOR USE. Ask your pharmacist for directions on how to use this medication. Read the information carefully. Talk to your pharmacist or care team if you have questions. Talk to your care team about the use of this  medication in children. While it may be prescribed for children for selected conditions, precautions do apply. Overdosage: If you think you have taken too much of this medicine contact a poison control center or emergency room at once. NOTE: This medicine is only for you. Do not share this medicine with others. What if I miss a dose? It is important not to miss any doses. Talk to your care team about what to do if you miss a dose. What may interact with this medication? Medications that may cause a release of neutrophils, such as lithium This list may not describe all possible interactions. Give your health care provider a list of all the medicines, herbs, non-prescription drugs, or dietary supplements you use. Also tell them if you smoke, drink alcohol, or use illegal drugs. Some items may interact with your medicine. What should I watch for while using this medication? Your condition will be monitored carefully while you are receiving this medication. You may need bloodwork while taking this medication. Talk to your care team about your risk of cancer. You may be more at risk for certain types of cancer if you take this medication. What side effects may I notice from receiving this medication? Side effects that you should report to your care team as soon as possible: Allergic reactions--skin rash, itching, hives, swelling of the face, lips, tongue, or throat Capillary leak syndrome--stomach or muscle pain, unusual weakness or fatigue, feeling faint or lightheaded, decrease in the amount of urine, swelling of the ankles, hands, or   feet, trouble breathing High white blood cell level--fever, fatigue, trouble breathing, night sweats, change in vision, weight loss Inflammation of the aorta--fever, fatigue, back, chest, or stomach pain, severe headache Kidney injury (glomerulonephritis)--decrease in the amount of urine, red or dark brown urine, foamy or bubbly urine, swelling of the ankles, hands, or  feet Shortness of breath or trouble breathing Spleen injury--pain in upper left stomach or shoulder Unusual bruising or bleeding Side effects that usually do not require medical attention (report to your care team if they continue or are bothersome): Back pain Bone pain Fatigue Fever Headache Nausea This list may not describe all possible side effects. Call your doctor for medical advice about side effects. You may report side effects to FDA at 1-800-FDA-1088. Where should I keep my medication? Keep out of the reach of children and pets. Keep this medication in the original packaging until you are ready to take it. Protect from light. See product for storage information. Each product may have different instructions. Get rid of any unused medication after the expiration date. To get rid of medications that are no longer needed or have expired: Take the medication to a medications take-back program. Check with your pharmacy or law enforcement to find a location. If you cannot return the medication, ask your pharmacist or care team how to get rid of this medication safely. NOTE: This sheet is a summary. It may not cover all possible information. If you have questions about this medicine, talk to your doctor, pharmacist, or health care provider.  2024 Elsevier/Gold Standard (2022-02-25 00:00:00)  

## 2023-08-25 NOTE — Telephone Encounter (Signed)
CALLED PATIENT TO INFORM OF LAB FOR 08-26-23 @ 8 AM , SPOKE WITH PATIENT'S DAUGHTER -SHANNON AND SHE IS AWARE OF THIS APPT.

## 2023-08-26 ENCOUNTER — Ambulatory Visit
Admission: RE | Admit: 2023-08-26 | Discharge: 2023-08-26 | Disposition: A | Discharge status: Home / Self Care | Payer: Medicare Other | Source: Ambulatory Visit | Attending: Radiation Oncology | Admitting: Radiation Oncology

## 2023-08-26 ENCOUNTER — Inpatient Hospital Stay: Payer: Medicare Other

## 2023-08-26 ENCOUNTER — Other Ambulatory Visit: Payer: Self-pay

## 2023-08-26 DIAGNOSIS — Z5111 Encounter for antineoplastic chemotherapy: Secondary | ICD-10-CM | POA: Diagnosis not present

## 2023-08-26 DIAGNOSIS — D508 Other iron deficiency anemias: Secondary | ICD-10-CM

## 2023-08-26 DIAGNOSIS — C342 Malignant neoplasm of middle lobe, bronchus or lung: Secondary | ICD-10-CM | POA: Diagnosis not present

## 2023-08-26 DIAGNOSIS — Z87891 Personal history of nicotine dependence: Secondary | ICD-10-CM | POA: Diagnosis not present

## 2023-08-26 DIAGNOSIS — Z79899 Other long term (current) drug therapy: Secondary | ICD-10-CM | POA: Diagnosis not present

## 2023-08-26 DIAGNOSIS — C3401 Malignant neoplasm of right main bronchus: Secondary | ICD-10-CM | POA: Diagnosis not present

## 2023-08-26 DIAGNOSIS — Z510 Encounter for antineoplastic radiation therapy: Secondary | ICD-10-CM | POA: Diagnosis not present

## 2023-08-26 DIAGNOSIS — D709 Neutropenia, unspecified: Secondary | ICD-10-CM | POA: Diagnosis not present

## 2023-08-26 DIAGNOSIS — F17210 Nicotine dependence, cigarettes, uncomplicated: Secondary | ICD-10-CM | POA: Diagnosis not present

## 2023-08-26 DIAGNOSIS — T451X5A Adverse effect of antineoplastic and immunosuppressive drugs, initial encounter: Secondary | ICD-10-CM

## 2023-08-26 LAB — CBC WITH DIFFERENTIAL (CANCER CENTER ONLY)
Abs Immature Granulocytes: 0.13 10*3/uL — ABNORMAL HIGH (ref 0.00–0.07)
Basophils Absolute: 0 10*3/uL (ref 0.0–0.1)
Basophils Relative: 0 %
Eosinophils Absolute: 0 10*3/uL (ref 0.0–0.5)
Eosinophils Relative: 1 %
HCT: 29.6 % — ABNORMAL LOW (ref 36.0–46.0)
Hemoglobin: 9.7 g/dL — ABNORMAL LOW (ref 12.0–15.0)
Immature Granulocytes: 4 %
Lymphocytes Relative: 21 %
Lymphs Abs: 0.6 10*3/uL — ABNORMAL LOW (ref 0.7–4.0)
MCH: 34.6 pg — ABNORMAL HIGH (ref 26.0–34.0)
MCHC: 32.8 g/dL (ref 30.0–36.0)
MCV: 105.7 fL — ABNORMAL HIGH (ref 80.0–100.0)
Monocytes Absolute: 1 10*3/uL (ref 0.1–1.0)
Monocytes Relative: 34 %
Neutro Abs: 1.2 10*3/uL — ABNORMAL LOW (ref 1.7–7.7)
Neutrophils Relative %: 40 %
Platelet Count: 134 10*3/uL — ABNORMAL LOW (ref 150–400)
RBC: 2.8 MIL/uL — ABNORMAL LOW (ref 3.87–5.11)
RDW: 14.5 % (ref 11.5–15.5)
WBC Count: 3 10*3/uL — ABNORMAL LOW (ref 4.0–10.5)
nRBC: 1.3 % — ABNORMAL HIGH (ref 0.0–0.2)

## 2023-08-26 LAB — RAD ONC ARIA SESSION SUMMARY
Course Elapsed Days: 18
Plan Fractions Treated to Date: 12
Plan Prescribed Dose Per Fraction: 2 Gy
Plan Total Fractions Prescribed: 30
Plan Total Prescribed Dose: 60 Gy
Reference Point Dosage Given to Date: 24 Gy
Reference Point Session Dosage Given: 2 Gy
Session Number: 12

## 2023-08-26 MED ORDER — FILGRASTIM-SNDZ 300 MCG/0.5ML IJ SOSY
300.0000 ug | PREFILLED_SYRINGE | Freq: Once | INTRAMUSCULAR | Status: AC
  Administered 2023-08-26: 300 ug via SUBCUTANEOUS

## 2023-08-26 MED FILL — Fosaprepitant Dimeglumine For IV Infusion 150 MG (Base Eq): INTRAVENOUS | Qty: 5 | Status: AC

## 2023-08-27 ENCOUNTER — Inpatient Hospital Stay: Payer: Medicare Other

## 2023-08-27 ENCOUNTER — Ambulatory Visit: Payer: Medicare Other

## 2023-08-27 VITALS — BP 100/59 | HR 123 | Temp 99.1°F | Resp 15

## 2023-08-27 DIAGNOSIS — D709 Neutropenia, unspecified: Secondary | ICD-10-CM | POA: Diagnosis not present

## 2023-08-27 DIAGNOSIS — C3401 Malignant neoplasm of right main bronchus: Secondary | ICD-10-CM | POA: Diagnosis not present

## 2023-08-27 DIAGNOSIS — T451X5A Adverse effect of antineoplastic and immunosuppressive drugs, initial encounter: Secondary | ICD-10-CM

## 2023-08-27 DIAGNOSIS — Z5111 Encounter for antineoplastic chemotherapy: Secondary | ICD-10-CM | POA: Diagnosis not present

## 2023-08-27 DIAGNOSIS — Z79899 Other long term (current) drug therapy: Secondary | ICD-10-CM | POA: Diagnosis not present

## 2023-08-27 DIAGNOSIS — D508 Other iron deficiency anemias: Secondary | ICD-10-CM

## 2023-08-27 DIAGNOSIS — F17210 Nicotine dependence, cigarettes, uncomplicated: Secondary | ICD-10-CM | POA: Diagnosis not present

## 2023-08-27 MED ORDER — FILGRASTIM-SNDZ 300 MCG/0.5ML IJ SOSY
300.0000 ug | PREFILLED_SYRINGE | Freq: Once | INTRAMUSCULAR | Status: AC
  Administered 2023-08-27: 300 ug via SUBCUTANEOUS
  Filled 2023-08-27: qty 0.5

## 2023-08-29 ENCOUNTER — Inpatient Hospital Stay: Payer: Medicare Other

## 2023-08-29 ENCOUNTER — Inpatient Hospital Stay (HOSPITAL_BASED_OUTPATIENT_CLINIC_OR_DEPARTMENT_OTHER): Payer: Medicare Other | Admitting: Physician Assistant

## 2023-08-29 ENCOUNTER — Other Ambulatory Visit: Payer: Self-pay

## 2023-08-29 ENCOUNTER — Inpatient Hospital Stay: Payer: Medicare Other | Admitting: Nutrition

## 2023-08-29 ENCOUNTER — Ambulatory Visit
Admission: RE | Admit: 2023-08-29 | Discharge: 2023-08-29 | Disposition: A | Discharge status: Home / Self Care | Payer: Medicare Other | Source: Ambulatory Visit | Attending: Radiation Oncology | Admitting: Radiation Oncology

## 2023-08-29 ENCOUNTER — Telehealth: Payer: Self-pay | Admitting: Internal Medicine

## 2023-08-29 VITALS — BP 95/57 | HR 102 | Temp 98.7°F | Resp 17 | Wt 87.5 lb

## 2023-08-29 DIAGNOSIS — C3401 Malignant neoplasm of right main bronchus: Secondary | ICD-10-CM

## 2023-08-29 DIAGNOSIS — D709 Neutropenia, unspecified: Secondary | ICD-10-CM | POA: Diagnosis not present

## 2023-08-29 DIAGNOSIS — C342 Malignant neoplasm of middle lobe, bronchus or lung: Secondary | ICD-10-CM | POA: Diagnosis not present

## 2023-08-29 DIAGNOSIS — F17210 Nicotine dependence, cigarettes, uncomplicated: Secondary | ICD-10-CM | POA: Diagnosis not present

## 2023-08-29 DIAGNOSIS — D509 Iron deficiency anemia, unspecified: Secondary | ICD-10-CM

## 2023-08-29 DIAGNOSIS — Z87891 Personal history of nicotine dependence: Secondary | ICD-10-CM | POA: Diagnosis not present

## 2023-08-29 DIAGNOSIS — Z5111 Encounter for antineoplastic chemotherapy: Secondary | ICD-10-CM | POA: Diagnosis not present

## 2023-08-29 DIAGNOSIS — Z79899 Other long term (current) drug therapy: Secondary | ICD-10-CM | POA: Diagnosis not present

## 2023-08-29 DIAGNOSIS — Z510 Encounter for antineoplastic radiation therapy: Secondary | ICD-10-CM | POA: Diagnosis not present

## 2023-08-29 LAB — CMP (CANCER CENTER ONLY)
ALT: 7 U/L (ref 0–44)
AST: 13 U/L — ABNORMAL LOW (ref 15–41)
Albumin: 3.4 g/dL — ABNORMAL LOW (ref 3.5–5.0)
Alkaline Phosphatase: 122 U/L (ref 38–126)
Anion gap: 7 (ref 5–15)
BUN: 8 mg/dL (ref 8–23)
CO2: 27 mmol/L (ref 22–32)
Calcium: 9 mg/dL (ref 8.9–10.3)
Chloride: 106 mmol/L (ref 98–111)
Creatinine: 0.86 mg/dL (ref 0.44–1.00)
GFR, Estimated: >60 mL/min (ref >60)
Glucose, Bld: 114 mg/dL — ABNORMAL HIGH (ref 70–99)
Potassium: 3.9 mmol/L (ref 3.5–5.1)
Sodium: 140 mmol/L (ref 135–145)
Total Bilirubin: 0.1 mg/dL (ref <1.2)
Total Protein: 6.3 g/dL — ABNORMAL LOW (ref 6.5–8.1)

## 2023-08-29 LAB — RAD ONC ARIA SESSION SUMMARY
Course Elapsed Days: 21
Plan Fractions Treated to Date: 13
Plan Prescribed Dose Per Fraction: 2 Gy
Plan Total Fractions Prescribed: 30
Plan Total Prescribed Dose: 60 Gy
Reference Point Dosage Given to Date: 26 Gy
Reference Point Session Dosage Given: 2 Gy
Session Number: 13

## 2023-08-29 LAB — CBC WITH DIFFERENTIAL (CANCER CENTER ONLY)
Abs Immature Granulocytes: 1.9 10*3/uL — ABNORMAL HIGH (ref 0.00–0.07)
Basophils Absolute: 0.1 10*3/uL (ref 0.0–0.1)
Basophils Relative: 1 %
Eosinophils Absolute: 0.1 10*3/uL (ref 0.0–0.5)
Eosinophils Relative: 1 %
HCT: 28.5 % — ABNORMAL LOW (ref 36.0–46.0)
Hemoglobin: 9.5 g/dL — ABNORMAL LOW (ref 12.0–15.0)
Immature Granulocytes: 15 %
Lymphocytes Relative: 10 %
Lymphs Abs: 1.3 10*3/uL (ref 0.7–4.0)
MCH: 34.9 pg — ABNORMAL HIGH (ref 26.0–34.0)
MCHC: 33.3 g/dL (ref 30.0–36.0)
MCV: 104.8 fL — ABNORMAL HIGH (ref 80.0–100.0)
Monocytes Absolute: 2.5 10*3/uL — ABNORMAL HIGH (ref 0.1–1.0)
Monocytes Relative: 20 %
Neutro Abs: 6.8 10*3/uL (ref 1.7–7.7)
Neutrophils Relative %: 53 %
Platelet Count: 236 10*3/uL (ref 150–400)
RBC: 2.72 MIL/uL — ABNORMAL LOW (ref 3.87–5.11)
RDW: 15.4 % (ref 11.5–15.5)
Smear Review: NORMAL
WBC Count: 12.6 10*3/uL — ABNORMAL HIGH (ref 4.0–10.5)
nRBC: 1 % — ABNORMAL HIGH (ref 0.0–0.2)

## 2023-08-29 LAB — MAGNESIUM: Magnesium: 1.8 mg/dL (ref 1.7–2.4)

## 2023-08-30 ENCOUNTER — Ambulatory Visit
Admission: RE | Admit: 2023-08-30 | Discharge: 2023-08-30 | Disposition: A | Discharge status: Home / Self Care | Payer: Medicare Other | Source: Ambulatory Visit | Attending: Radiation Oncology

## 2023-08-30 ENCOUNTER — Inpatient Hospital Stay: Payer: Medicare Other

## 2023-08-30 ENCOUNTER — Other Ambulatory Visit: Payer: Self-pay

## 2023-08-30 DIAGNOSIS — C3401 Malignant neoplasm of right main bronchus: Secondary | ICD-10-CM | POA: Diagnosis not present

## 2023-08-30 DIAGNOSIS — C342 Malignant neoplasm of middle lobe, bronchus or lung: Secondary | ICD-10-CM | POA: Diagnosis not present

## 2023-08-30 DIAGNOSIS — Z510 Encounter for antineoplastic radiation therapy: Secondary | ICD-10-CM | POA: Diagnosis not present

## 2023-08-30 DIAGNOSIS — Z87891 Personal history of nicotine dependence: Secondary | ICD-10-CM | POA: Diagnosis not present

## 2023-08-30 LAB — RAD ONC ARIA SESSION SUMMARY
Course Elapsed Days: 22
Plan Fractions Treated to Date: 14
Plan Prescribed Dose Per Fraction: 2 Gy
Plan Total Fractions Prescribed: 30
Plan Total Prescribed Dose: 60 Gy
Reference Point Dosage Given to Date: 28 Gy
Reference Point Session Dosage Given: 2 Gy
Session Number: 14

## 2023-08-31 ENCOUNTER — Ambulatory Visit
Admission: RE | Admit: 2023-08-31 | Discharge: 2023-08-31 | Discharge status: Home / Self Care | Payer: Medicare Other | Source: Ambulatory Visit | Attending: Radiation Oncology

## 2023-08-31 ENCOUNTER — Inpatient Hospital Stay: Payer: Medicare Other

## 2023-08-31 ENCOUNTER — Other Ambulatory Visit: Payer: Self-pay

## 2023-08-31 DIAGNOSIS — C3401 Malignant neoplasm of right main bronchus: Secondary | ICD-10-CM | POA: Diagnosis not present

## 2023-08-31 DIAGNOSIS — Z87891 Personal history of nicotine dependence: Secondary | ICD-10-CM | POA: Diagnosis not present

## 2023-08-31 DIAGNOSIS — C342 Malignant neoplasm of middle lobe, bronchus or lung: Secondary | ICD-10-CM | POA: Diagnosis not present

## 2023-08-31 DIAGNOSIS — Z510 Encounter for antineoplastic radiation therapy: Secondary | ICD-10-CM | POA: Diagnosis not present

## 2023-08-31 LAB — RAD ONC ARIA SESSION SUMMARY
Course Elapsed Days: 23
Plan Fractions Treated to Date: 15
Plan Prescribed Dose Per Fraction: 2 Gy
Plan Total Fractions Prescribed: 30
Plan Total Prescribed Dose: 60 Gy
Reference Point Dosage Given to Date: 30 Gy
Reference Point Session Dosage Given: 2 Gy
Session Number: 15

## 2023-09-01 ENCOUNTER — Other Ambulatory Visit: Payer: Self-pay

## 2023-09-01 ENCOUNTER — Ambulatory Visit
Admission: RE | Admit: 2023-09-01 | Discharge: 2023-09-01 | Disposition: A | Discharge status: Home / Self Care | Payer: Medicare Other | Source: Ambulatory Visit | Attending: Radiation Oncology | Admitting: Radiation Oncology

## 2023-09-01 DIAGNOSIS — Z87891 Personal history of nicotine dependence: Secondary | ICD-10-CM | POA: Diagnosis not present

## 2023-09-01 DIAGNOSIS — C3401 Malignant neoplasm of right main bronchus: Secondary | ICD-10-CM | POA: Diagnosis not present

## 2023-09-01 DIAGNOSIS — Z510 Encounter for antineoplastic radiation therapy: Secondary | ICD-10-CM | POA: Diagnosis not present

## 2023-09-01 DIAGNOSIS — C342 Malignant neoplasm of middle lobe, bronchus or lung: Secondary | ICD-10-CM | POA: Diagnosis not present

## 2023-09-01 LAB — RAD ONC ARIA SESSION SUMMARY
Course Elapsed Days: 24
Plan Fractions Treated to Date: 16
Plan Prescribed Dose Per Fraction: 2 Gy
Plan Total Fractions Prescribed: 30
Plan Total Prescribed Dose: 60 Gy
Reference Point Dosage Given to Date: 32 Gy
Reference Point Session Dosage Given: 2 Gy
Session Number: 16

## 2023-09-02 ENCOUNTER — Ambulatory Visit
Admission: RE | Admit: 2023-09-02 | Discharge: 2023-09-02 | Disposition: A | Discharge status: Home / Self Care | Payer: Medicare Other | Source: Ambulatory Visit | Attending: Radiation Oncology | Admitting: Radiation Oncology

## 2023-09-02 ENCOUNTER — Ambulatory Visit
Admission: RE | Admit: 2023-09-02 | Discharge: 2023-09-02 | Disposition: A | Discharge status: Home / Self Care | Payer: Medicare Other | Source: Ambulatory Visit | Attending: Radiation Oncology

## 2023-09-02 ENCOUNTER — Other Ambulatory Visit: Payer: Self-pay

## 2023-09-02 DIAGNOSIS — C342 Malignant neoplasm of middle lobe, bronchus or lung: Secondary | ICD-10-CM | POA: Diagnosis not present

## 2023-09-02 DIAGNOSIS — Z510 Encounter for antineoplastic radiation therapy: Secondary | ICD-10-CM | POA: Diagnosis not present

## 2023-09-02 DIAGNOSIS — C3401 Malignant neoplasm of right main bronchus: Secondary | ICD-10-CM | POA: Diagnosis not present

## 2023-09-02 DIAGNOSIS — Z87891 Personal history of nicotine dependence: Secondary | ICD-10-CM | POA: Diagnosis not present

## 2023-09-02 LAB — RAD ONC ARIA SESSION SUMMARY
Course Elapsed Days: 25
Plan Fractions Treated to Date: 17
Plan Prescribed Dose Per Fraction: 2 Gy
Plan Total Fractions Prescribed: 30
Plan Total Prescribed Dose: 60 Gy
Reference Point Dosage Given to Date: 34 Gy
Reference Point Session Dosage Given: 2 Gy
Session Number: 17

## 2023-09-05 ENCOUNTER — Ambulatory Visit
Admission: RE | Admit: 2023-09-05 | Discharge: 2023-09-05 | Disposition: A | Discharge status: Home / Self Care | Payer: Medicare Other | Source: Ambulatory Visit | Attending: Radiation Oncology | Admitting: Radiation Oncology

## 2023-09-05 ENCOUNTER — Other Ambulatory Visit: Payer: Self-pay

## 2023-09-05 DIAGNOSIS — Z510 Encounter for antineoplastic radiation therapy: Secondary | ICD-10-CM | POA: Diagnosis not present

## 2023-09-05 DIAGNOSIS — C342 Malignant neoplasm of middle lobe, bronchus or lung: Secondary | ICD-10-CM | POA: Diagnosis not present

## 2023-09-05 DIAGNOSIS — C3401 Malignant neoplasm of right main bronchus: Secondary | ICD-10-CM | POA: Diagnosis not present

## 2023-09-05 DIAGNOSIS — Z87891 Personal history of nicotine dependence: Secondary | ICD-10-CM | POA: Diagnosis not present

## 2023-09-05 LAB — RAD ONC ARIA SESSION SUMMARY
Course Elapsed Days: 28
Plan Fractions Treated to Date: 18
Plan Prescribed Dose Per Fraction: 2 Gy
Plan Total Fractions Prescribed: 30
Plan Total Prescribed Dose: 60 Gy
Reference Point Dosage Given to Date: 36 Gy
Reference Point Session Dosage Given: 2 Gy
Session Number: 18

## 2023-09-05 MED FILL — Fosaprepitant Dimeglumine For IV Infusion 150 MG (Base Eq): INTRAVENOUS | Qty: 5 | Status: AC

## 2023-09-06 ENCOUNTER — Ambulatory Visit
Admission: RE | Admit: 2023-09-06 | Discharge: 2023-09-06 | Discharge status: Home / Self Care | Payer: Medicare Other | Source: Ambulatory Visit | Attending: Radiation Oncology

## 2023-09-06 ENCOUNTER — Encounter: Payer: Medicare Other | Admitting: Nutrition

## 2023-09-06 ENCOUNTER — Other Ambulatory Visit: Payer: Self-pay

## 2023-09-06 ENCOUNTER — Other Ambulatory Visit: Payer: Medicare Other

## 2023-09-06 ENCOUNTER — Inpatient Hospital Stay: Payer: Medicare Other

## 2023-09-06 ENCOUNTER — Inpatient Hospital Stay: Payer: Medicare Other | Admitting: Dietician

## 2023-09-06 VITALS — BP 103/61 | HR 86 | Temp 98.5°F | Resp 16 | Wt 87.5 lb

## 2023-09-06 DIAGNOSIS — C3401 Malignant neoplasm of right main bronchus: Secondary | ICD-10-CM | POA: Diagnosis not present

## 2023-09-06 DIAGNOSIS — D709 Neutropenia, unspecified: Secondary | ICD-10-CM | POA: Diagnosis not present

## 2023-09-06 DIAGNOSIS — Z510 Encounter for antineoplastic radiation therapy: Secondary | ICD-10-CM | POA: Diagnosis not present

## 2023-09-06 DIAGNOSIS — Z5111 Encounter for antineoplastic chemotherapy: Secondary | ICD-10-CM | POA: Diagnosis not present

## 2023-09-06 DIAGNOSIS — F17210 Nicotine dependence, cigarettes, uncomplicated: Secondary | ICD-10-CM | POA: Diagnosis not present

## 2023-09-06 DIAGNOSIS — D509 Iron deficiency anemia, unspecified: Secondary | ICD-10-CM

## 2023-09-06 DIAGNOSIS — Z87891 Personal history of nicotine dependence: Secondary | ICD-10-CM | POA: Diagnosis not present

## 2023-09-06 DIAGNOSIS — C342 Malignant neoplasm of middle lobe, bronchus or lung: Secondary | ICD-10-CM | POA: Diagnosis not present

## 2023-09-06 DIAGNOSIS — Z79899 Other long term (current) drug therapy: Secondary | ICD-10-CM | POA: Diagnosis not present

## 2023-09-06 LAB — RAD ONC ARIA SESSION SUMMARY
Course Elapsed Days: 29
Plan Fractions Treated to Date: 19
Plan Prescribed Dose Per Fraction: 2 Gy
Plan Total Fractions Prescribed: 30
Plan Total Prescribed Dose: 60 Gy
Reference Point Dosage Given to Date: 38 Gy
Reference Point Session Dosage Given: 2 Gy
Session Number: 19

## 2023-09-06 LAB — CBC WITH DIFFERENTIAL (CANCER CENTER ONLY)
Abs Immature Granulocytes: 0.12 10*3/uL — ABNORMAL HIGH (ref 0.00–0.07)
Basophils Absolute: 0.1 10*3/uL (ref 0.0–0.1)
Basophils Relative: 1 %
Eosinophils Absolute: 0 10*3/uL (ref 0.0–0.5)
Eosinophils Relative: 0 %
HCT: 29.4 % — ABNORMAL LOW (ref 36.0–46.0)
Hemoglobin: 9.8 g/dL — ABNORMAL LOW (ref 12.0–15.0)
Immature Granulocytes: 1 %
Lymphocytes Relative: 4 %
Lymphs Abs: 0.4 10*3/uL — ABNORMAL LOW (ref 0.7–4.0)
MCH: 35 pg — ABNORMAL HIGH (ref 26.0–34.0)
MCHC: 33.3 g/dL (ref 30.0–36.0)
MCV: 105 fL — ABNORMAL HIGH (ref 80.0–100.0)
Monocytes Absolute: 0.7 10*3/uL (ref 0.1–1.0)
Monocytes Relative: 7 %
Neutro Abs: 7.8 10*3/uL — ABNORMAL HIGH (ref 1.7–7.7)
Neutrophils Relative %: 87 %
Platelet Count: 520 10*3/uL — ABNORMAL HIGH (ref 150–400)
RBC: 2.8 MIL/uL — ABNORMAL LOW (ref 3.87–5.11)
RDW: 17.3 % — ABNORMAL HIGH (ref 11.5–15.5)
WBC Count: 9 10*3/uL (ref 4.0–10.5)
nRBC: 0 % (ref 0.0–0.2)

## 2023-09-06 LAB — CMP (CANCER CENTER ONLY)
ALT: <5 U/L (ref 0–44)
AST: 11 U/L — ABNORMAL LOW (ref 15–41)
Albumin: 3.6 g/dL (ref 3.5–5.0)
Alkaline Phosphatase: 107 U/L (ref 38–126)
Anion gap: 7 (ref 5–15)
BUN: 15 mg/dL (ref 8–23)
CO2: 23 mmol/L (ref 22–32)
Calcium: 8.9 mg/dL (ref 8.9–10.3)
Chloride: 109 mmol/L (ref 98–111)
Creatinine: 0.78 mg/dL (ref 0.44–1.00)
GFR, Estimated: >60 mL/min (ref >60)
Glucose, Bld: 99 mg/dL (ref 70–99)
Potassium: 4.3 mmol/L (ref 3.5–5.1)
Sodium: 139 mmol/L (ref 135–145)
Total Bilirubin: 0.1 mg/dL (ref <1.2)
Total Protein: 6.6 g/dL (ref 6.5–8.1)

## 2023-09-06 LAB — SAMPLE TO BLOOD BANK

## 2023-09-06 LAB — MAGNESIUM: Magnesium: 2 mg/dL (ref 1.7–2.4)

## 2023-09-06 MED ORDER — PALONOSETRON HCL INJECTION 0.25 MG/5ML
0.2500 mg | Freq: Once | INTRAVENOUS | Status: AC
  Administered 2023-09-06: 0.25 mg via INTRAVENOUS
  Filled 2023-09-06: qty 5

## 2023-09-06 MED ORDER — DEXAMETHASONE SODIUM PHOSPHATE 10 MG/ML IJ SOLN
10.0000 mg | Freq: Once | INTRAMUSCULAR | Status: AC
  Administered 2023-09-06: 10 mg via INTRAVENOUS
  Filled 2023-09-06: qty 1

## 2023-09-06 MED ORDER — SODIUM CHLORIDE 0.9 % IV SOLN: INTRAVENOUS | Status: DC

## 2023-09-06 MED ORDER — SODIUM CHLORIDE 0.9 % IV SOLN
150.0000 mg | Freq: Once | INTRAVENOUS | Status: AC
  Administered 2023-09-06: 150 mg via INTRAVENOUS
  Filled 2023-09-06: qty 5
  Filled 2023-09-06: qty 150

## 2023-09-06 MED ORDER — MAGNESIUM SULFATE 2 GM/50ML IV SOLN
2.0000 g | Freq: Once | INTRAVENOUS | Status: AC
  Administered 2023-09-06: 2 g via INTRAVENOUS
  Filled 2023-09-06: qty 50

## 2023-09-06 MED ORDER — POTASSIUM CHLORIDE IN NACL 20-0.9 MEQ/L-% IV SOLN
Freq: Once | INTRAVENOUS | Status: AC
  Filled 2023-09-06: qty 1000

## 2023-09-06 MED ORDER — SODIUM CHLORIDE 0.9 % IV SOLN
100.0000 mg/m2 | Freq: Once | INTRAVENOUS | Status: AC
  Administered 2023-09-06: 132 mg via INTRAVENOUS
  Filled 2023-09-06: qty 6.6

## 2023-09-06 MED ORDER — SODIUM CHLORIDE 0.9 % IV SOLN
80.0000 mg/m2 | Freq: Once | INTRAVENOUS | Status: AC
  Administered 2023-09-06: 100 mg via INTRAVENOUS
  Filled 2023-09-06: qty 100

## 2023-09-06 NOTE — Patient Instructions (Signed)
 [facility] - A DEPT OF [facility]  Discharge Instructions: Thank you for choosing [facility] to provide your oncology and hematology care.   If you have a lab appointment with the Cancer Center, please go directly to the Cancer Center and check in at the registration area.   Wear comfortable clothing and clothing appropriate for easy access to any Portacath or PICC line.   We strive to give you quality time with your provider. You may need to reschedule your appointment if you arrive late (15 or more minutes).  Arriving late affects you and other patients whose appointments are after yours.  Also, if you miss three or more appointments without notifying the office, you may be dismissed from the clinic at the provider's discretion.      For prescription refill requests, have your pharmacy contact our office and allow 72 hours for refills to be completed.    Today you received the following chemotherapy and/or immunotherapy agents Cisplatin & Etoposide      To help prevent nausea and vomiting after your treatment, we encourage you to take your nausea medication as directed.  BELOW ARE SYMPTOMS THAT SHOULD BE REPORTED IMMEDIATELY: *FEVER GREATER THAN 100.4 F (38 C) OR HIGHER *CHILLS OR SWEATING *NAUSEA AND VOMITING THAT IS NOT CONTROLLED WITH YOUR NAUSEA MEDICATION *UNUSUAL SHORTNESS OF BREATH *UNUSUAL BRUISING OR BLEEDING *URINARY PROBLEMS (pain or burning when urinating, or frequent urination) *BOWEL PROBLEMS (unusual diarrhea, constipation, pain near the anus) TENDERNESS IN MOUTH AND THROAT WITH OR WITHOUT PRESENCE OF ULCERS (sore throat, sores in mouth, or a toothache) UNUSUAL RASH, SWELLING OR PAIN  UNUSUAL VAGINAL DISCHARGE OR ITCHING   Items with * indicate a potential emergency and should be followed up as soon as possible or go to the Emergency Department if any problems should occur.  Please show the CHEMOTHERAPY ALERT CARD or  IMMUNOTHERAPY ALERT CARD at check-in to the Emergency Department and triage nurse.  Should you have questions after your visit or need to cancel or reschedule your appointment, please contact [facility] - A DEPT OF [facility]  Dept: [phone]  and follow the prompts.  Office hours are 8:00 a.m. to 4:30 p.m. Monday - Friday. Please note that voicemails left after 4:00 p.m. may not be returned until the following business day.  We are closed weekends and major holidays. You have access to a nurse at all times for urgent questions. Please call the main number to the clinic Dept: [phone] and follow the prompts.   For any non-urgent questions, you may also contact your provider using MyChart. We now offer e-Visits for anyone 28 and older to request care online for non-urgent symptoms. For details visit [email or web address].   Also download the MyChart app! Go to the app store, search "MyChart", open the app, select , and log in with your MyChart username and password.

## 2023-09-06 NOTE — Progress Notes (Signed)
Nutrition Assessment   Reason for Assessment: Referral (wt loss, poor appetite)   ASSESSMENT: 67 year old female with limited stage small cell lung cancer. She is receiving cisplatin + etoposide q21d concurrent with radiation (final RT planned 12/10). Patient is under the care of Dr. Mitzi Hansen and Dr. Arbutus Ped.   Past medical history includes COPD, GERD, emphysema, HTN, HLD, PVP, TIA, vit D deficiency, recurrent UGI, PCM, IDA  Met with patient in infusion. Daughter is present at visit. Patient reports poor appetite. Nothing taste good. Food is bland or does not taste right. She reports never being a big eater and unable to "force food down." Patient endorses decreased appetite and unintentional weight loss starting in June/July. She reports mild esophageal discomfort  with oral intake. Carafate is working well per daughter. Patient tolerating smooth foods best (puddings, ice cream, soup). She is drinking premier protein (170 kcal, 30 g). Patient is working to increase intake of water. Recalls some water along with cranapple juice, milk, gatorade, 2 sprites. Patient denies nausea, vomiting, diarrhea, constipation.   Nutrition Focused Physical Exam: deferred   Medications: carafate, integra caps, niferex, MMW, mag-ox, prilosec, zofran, compazine, vit D  Labs: reviewed    Anthropometrics:   Height: 5'1" Weight: 87 lb 8 oz  UBW: 115 lb (June 2023) BMI: 16.53   NUTRITION DIAGNOSIS: Inadequate oral intake related to cancer and associated treatment side effects as evidenced by mouth sores, taste changes, 24% decrease from usual weight in the last 17 months   INTERVENTION:  Educated on importance of adequate calorie/protein energy intake to maintain strength/weights Encouraged small meals frequently throughout day vs 3 larger meals - snack ideas provided  Discussed strategies for adding calories/protein to foods (adding gravy/butter/cheese, switching to whole milk, adding ONS to ice  cream/coffee) - shake recipes provided  Educated on soft moist foods for ease of intake - handout with ideas provided  Suggested switching to Ensure Plus/equivalent vs premier for calories - recommend 2/day in between meals (samples of Ensure Complete, Ensure HP, CIB powder) Educated on strategies for altered taste, suggested baking soda salt water gargle prior to eating - handout with tips + recipe provided   MONITORING, EVALUATION, GOAL: Patient will tolerate increased calories and protein to promote weight gain    Next Visit: Tuesday December 10 during infusion

## 2023-09-07 ENCOUNTER — Inpatient Hospital Stay: Payer: Medicare Other

## 2023-09-07 ENCOUNTER — Other Ambulatory Visit: Payer: Self-pay

## 2023-09-07 ENCOUNTER — Ambulatory Visit: Payer: Medicare Other | Admitting: Adult Health

## 2023-09-07 ENCOUNTER — Ambulatory Visit
Admission: RE | Admit: 2023-09-07 | Discharge: 2023-09-07 | Disposition: A | Discharge status: Home / Self Care | Payer: Medicare Other | Source: Ambulatory Visit | Attending: Radiation Oncology | Admitting: Radiation Oncology

## 2023-09-07 VITALS — BP 113/54 | HR 91 | Temp 98.7°F | Resp 24 | Wt 92.5 lb

## 2023-09-07 DIAGNOSIS — C3401 Malignant neoplasm of right main bronchus: Secondary | ICD-10-CM

## 2023-09-07 DIAGNOSIS — D709 Neutropenia, unspecified: Secondary | ICD-10-CM | POA: Diagnosis not present

## 2023-09-07 DIAGNOSIS — Z79899 Other long term (current) drug therapy: Secondary | ICD-10-CM | POA: Diagnosis not present

## 2023-09-07 DIAGNOSIS — C342 Malignant neoplasm of middle lobe, bronchus or lung: Secondary | ICD-10-CM | POA: Diagnosis not present

## 2023-09-07 DIAGNOSIS — F17210 Nicotine dependence, cigarettes, uncomplicated: Secondary | ICD-10-CM | POA: Diagnosis not present

## 2023-09-07 DIAGNOSIS — Z510 Encounter for antineoplastic radiation therapy: Secondary | ICD-10-CM | POA: Diagnosis not present

## 2023-09-07 DIAGNOSIS — Z5111 Encounter for antineoplastic chemotherapy: Secondary | ICD-10-CM | POA: Diagnosis not present

## 2023-09-07 DIAGNOSIS — Z87891 Personal history of nicotine dependence: Secondary | ICD-10-CM | POA: Diagnosis not present

## 2023-09-07 LAB — RAD ONC ARIA SESSION SUMMARY
Course Elapsed Days: 30
Plan Fractions Treated to Date: 20
Plan Prescribed Dose Per Fraction: 2 Gy
Plan Total Fractions Prescribed: 30
Plan Total Prescribed Dose: 60 Gy
Reference Point Dosage Given to Date: 40 Gy
Reference Point Session Dosage Given: 2 Gy
Session Number: 20

## 2023-09-07 MED ORDER — SODIUM CHLORIDE 0.9 % IV SOLN
100.0000 mg/m2 | Freq: Once | INTRAVENOUS | Status: AC
  Administered 2023-09-07: 132 mg via INTRAVENOUS
  Filled 2023-09-07: qty 6.6

## 2023-09-07 MED ORDER — SODIUM CHLORIDE 0.9 % IV SOLN: INTRAVENOUS | Status: DC

## 2023-09-07 MED ORDER — DEXAMETHASONE SODIUM PHOSPHATE 10 MG/ML IJ SOLN
10.0000 mg | Freq: Once | INTRAMUSCULAR | Status: AC
Start: 2023-09-07 — End: 2023-09-07
  Administered 2023-09-07: 10 mg via INTRAVENOUS
  Filled 2023-09-07: qty 1

## 2023-09-07 NOTE — Patient Instructions (Signed)
 [facility] - [facility]  Discharge Instructions: Thank you for choosing [facility] to provide your oncology and hematology care.   If you have a lab appointment with the Cancer Center, please go directly to the Cancer Center and check in at the registration area.   Wear comfortable clothing and clothing appropriate for easy access to any Portacath or PICC line.   We strive to give you quality time with your provider. You may need to reschedule your appointment if you arrive late (15 or more minutes).  Arriving late affects you and other patients whose appointments are after yours.  Also, if you miss three or more appointments without notifying the office, you may be dismissed from the clinic at the provider's discretion.      For prescription refill requests, have your pharmacy contact our office and allow 72 hours for refills to be completed.    Today you received the following chemotherapy and/or immunotherapy agents: Etoposide      To help prevent nausea and vomiting after your treatment, we encourage you to take your nausea medication as directed.  BELOW ARE SYMPTOMS THAT SHOULD BE REPORTED IMMEDIATELY: *FEVER GREATER THAN 100.4 F (38 C) OR HIGHER *CHILLS OR SWEATING *NAUSEA AND VOMITING THAT IS NOT CONTROLLED WITH YOUR NAUSEA MEDICATION *UNUSUAL SHORTNESS OF BREATH *UNUSUAL BRUISING OR BLEEDING *URINARY PROBLEMS (pain or burning when urinating, or frequent urination) *BOWEL PROBLEMS (unusual diarrhea, constipation, pain near the anus) TENDERNESS IN MOUTH AND THROAT WITH OR WITHOUT PRESENCE OF ULCERS (sore throat, sores in mouth, or a toothache) UNUSUAL RASH, SWELLING OR PAIN  UNUSUAL VAGINAL DISCHARGE OR ITCHING   Items with * indicate a potential emergency and should be followed up as soon as possible or go to the Emergency Department if any problems should occur.  Please show the CHEMOTHERAPY ALERT CARD or IMMUNOTHERAPY  ALERT CARD at check-in to the Emergency Department and triage nurse.  Should you have questions after your visit or need to cancel or reschedule your appointment, please contact [facility] - A DEPT OF [facility]  Dept: [phone]  and follow the prompts.  Office hours are 8:00 a.m. to 4:30 p.m. Monday - Friday. Please note that voicemails left after 4:00 p.m. may not be returned until the following business day.  We are closed weekends and major holidays. You have access to a nurse at all times for urgent questions. Please call the main number to the clinic Dept: [phone] and follow the prompts.   For any non-urgent questions, you may also contact your provider using MyChart. We now offer e-Visits for anyone 32 and older to request care online for non-urgent symptoms. For details visit [email or web address].   Also download the MyChart app! Go to the app store, search "MyChart", open the app, select [facility], and log in with your MyChart username and password.

## 2023-09-08 ENCOUNTER — Inpatient Hospital Stay: Payer: Medicare Other

## 2023-09-08 ENCOUNTER — Ambulatory Visit
Admission: RE | Admit: 2023-09-08 | Discharge: 2023-09-08 | Disposition: A | Discharge status: Home / Self Care | Payer: Medicare Other | Source: Ambulatory Visit | Attending: Radiation Oncology | Admitting: Radiation Oncology

## 2023-09-08 ENCOUNTER — Other Ambulatory Visit: Payer: Self-pay

## 2023-09-08 VITALS — BP 122/61 | HR 90 | Temp 98.0°F | Resp 20

## 2023-09-08 DIAGNOSIS — Z79899 Other long term (current) drug therapy: Secondary | ICD-10-CM | POA: Diagnosis not present

## 2023-09-08 DIAGNOSIS — Z5111 Encounter for antineoplastic chemotherapy: Secondary | ICD-10-CM | POA: Diagnosis not present

## 2023-09-08 DIAGNOSIS — C3401 Malignant neoplasm of right main bronchus: Secondary | ICD-10-CM

## 2023-09-08 DIAGNOSIS — F17210 Nicotine dependence, cigarettes, uncomplicated: Secondary | ICD-10-CM | POA: Diagnosis not present

## 2023-09-08 DIAGNOSIS — Z87891 Personal history of nicotine dependence: Secondary | ICD-10-CM | POA: Diagnosis not present

## 2023-09-08 DIAGNOSIS — C342 Malignant neoplasm of middle lobe, bronchus or lung: Secondary | ICD-10-CM | POA: Diagnosis not present

## 2023-09-08 DIAGNOSIS — D709 Neutropenia, unspecified: Secondary | ICD-10-CM | POA: Diagnosis not present

## 2023-09-08 DIAGNOSIS — Z510 Encounter for antineoplastic radiation therapy: Secondary | ICD-10-CM | POA: Diagnosis not present

## 2023-09-08 LAB — RAD ONC ARIA SESSION SUMMARY
Course Elapsed Days: 31
Plan Fractions Treated to Date: 21
Plan Prescribed Dose Per Fraction: 2 Gy
Plan Total Fractions Prescribed: 30
Plan Total Prescribed Dose: 60 Gy
Reference Point Dosage Given to Date: 42 Gy
Reference Point Session Dosage Given: 2 Gy
Session Number: 21

## 2023-09-08 MED ORDER — DEXAMETHASONE SODIUM PHOSPHATE 10 MG/ML IJ SOLN
10.0000 mg | Freq: Once | INTRAMUSCULAR | Status: AC
Start: 2023-09-08 — End: 2023-09-08
  Administered 2023-09-08: 10 mg via INTRAVENOUS
  Filled 2023-09-08: qty 1

## 2023-09-08 MED ORDER — SODIUM CHLORIDE 0.9 % IV SOLN: INTRAVENOUS | Status: DC

## 2023-09-08 MED ORDER — SODIUM CHLORIDE 0.9 % IV SOLN
100.0000 mg/m2 | Freq: Once | INTRAVENOUS | Status: AC
  Administered 2023-09-08: 132 mg via INTRAVENOUS
  Filled 2023-09-08: qty 6.6

## 2023-09-08 NOTE — Patient Instructions (Signed)
 [facility] - [facility]  Discharge Instructions: Thank you for choosing [facility] to provide your oncology and hematology care.   If you have a lab appointment with the Cancer Center, please go directly to the Cancer Center and check in at the registration area.   Wear comfortable clothing and clothing appropriate for easy access to any Portacath or PICC line.   We strive to give you quality time with your provider. You may need to reschedule your appointment if you arrive late (15 or more minutes).  Arriving late affects you and other patients whose appointments are after yours.  Also, if you miss three or more appointments without notifying the office, you may be dismissed from the clinic at the provider's discretion.      For prescription refill requests, have your pharmacy contact our office and allow 72 hours for refills to be completed.    Today you received the following chemotherapy and/or immunotherapy agents: Etoposide      To help prevent nausea and vomiting after your treatment, we encourage you to take your nausea medication as directed.  BELOW ARE SYMPTOMS THAT SHOULD BE REPORTED IMMEDIATELY: *FEVER GREATER THAN 100.4 F (38 C) OR HIGHER *CHILLS OR SWEATING *NAUSEA AND VOMITING THAT IS NOT CONTROLLED WITH YOUR NAUSEA MEDICATION *UNUSUAL SHORTNESS OF BREATH *UNUSUAL BRUISING OR BLEEDING *URINARY PROBLEMS (pain or burning when urinating, or frequent urination) *BOWEL PROBLEMS (unusual diarrhea, constipation, pain near the anus) TENDERNESS IN MOUTH AND THROAT WITH OR WITHOUT PRESENCE OF ULCERS (sore throat, sores in mouth, or a toothache) UNUSUAL RASH, SWELLING OR PAIN  UNUSUAL VAGINAL DISCHARGE OR ITCHING   Items with * indicate a potential emergency and should be followed up as soon as possible or go to the Emergency Department if any problems should occur.  Please show the CHEMOTHERAPY ALERT CARD or IMMUNOTHERAPY  ALERT CARD at check-in to the Emergency Department and triage nurse.  Should you have questions after your visit or need to cancel or reschedule your appointment, please contact [facility] - A DEPT OF [facility]  Dept: [phone]  and follow the prompts.  Office hours are 8:00 a.m. to 4:30 p.m. Monday - Friday. Please note that voicemails left after 4:00 p.m. may not be returned until the following business day.  We are closed weekends and major holidays. You have access to a nurse at all times for urgent questions. Please call the main number to the clinic Dept: [phone] and follow the prompts.   For any non-urgent questions, you may also contact your provider using MyChart. We now offer e-Visits for anyone 32 and older to request care online for non-urgent symptoms. For details visit [email or web address].   Also download the MyChart app! Go to the app store, search "MyChart", open the app, select [facility], and log in with your MyChart username and password.

## 2023-09-09 ENCOUNTER — Other Ambulatory Visit: Payer: Self-pay

## 2023-09-09 ENCOUNTER — Ambulatory Visit
Admission: RE | Admit: 2023-09-09 | Discharge: 2023-09-09 | Disposition: A | Discharge status: Home / Self Care | Payer: Medicare Other | Source: Ambulatory Visit | Attending: Radiation Oncology | Admitting: Radiation Oncology

## 2023-09-09 DIAGNOSIS — Z87891 Personal history of nicotine dependence: Secondary | ICD-10-CM | POA: Diagnosis not present

## 2023-09-09 DIAGNOSIS — C342 Malignant neoplasm of middle lobe, bronchus or lung: Secondary | ICD-10-CM | POA: Diagnosis not present

## 2023-09-09 DIAGNOSIS — C3401 Malignant neoplasm of right main bronchus: Secondary | ICD-10-CM | POA: Diagnosis not present

## 2023-09-09 DIAGNOSIS — Z510 Encounter for antineoplastic radiation therapy: Secondary | ICD-10-CM | POA: Diagnosis not present

## 2023-09-09 LAB — RAD ONC ARIA SESSION SUMMARY
Course Elapsed Days: 32
Plan Fractions Treated to Date: 22
Plan Prescribed Dose Per Fraction: 2 Gy
Plan Total Fractions Prescribed: 30
Plan Total Prescribed Dose: 60 Gy
Reference Point Dosage Given to Date: 44 Gy
Reference Point Session Dosage Given: 2 Gy
Session Number: 22

## 2023-09-11 ENCOUNTER — Other Ambulatory Visit: Payer: Self-pay

## 2023-09-11 ENCOUNTER — Ambulatory Visit
Admission: RE | Admit: 2023-09-11 | Discharge: 2023-09-11 | Disposition: A | Discharge status: Home / Self Care | Payer: Medicare Other | Source: Ambulatory Visit | Attending: Radiation Oncology | Admitting: Radiation Oncology

## 2023-09-11 ENCOUNTER — Ambulatory Visit: Payer: Medicare Other

## 2023-09-11 DIAGNOSIS — Z87891 Personal history of nicotine dependence: Secondary | ICD-10-CM | POA: Diagnosis not present

## 2023-09-11 DIAGNOSIS — C342 Malignant neoplasm of middle lobe, bronchus or lung: Secondary | ICD-10-CM | POA: Diagnosis not present

## 2023-09-11 DIAGNOSIS — C3401 Malignant neoplasm of right main bronchus: Secondary | ICD-10-CM | POA: Diagnosis not present

## 2023-09-11 DIAGNOSIS — Z510 Encounter for antineoplastic radiation therapy: Secondary | ICD-10-CM | POA: Diagnosis not present

## 2023-09-11 LAB — RAD ONC ARIA SESSION SUMMARY
Course Elapsed Days: 34
Plan Fractions Treated to Date: 23
Plan Prescribed Dose Per Fraction: 2 Gy
Plan Total Fractions Prescribed: 30
Plan Total Prescribed Dose: 60 Gy
Reference Point Dosage Given to Date: 46 Gy
Reference Point Session Dosage Given: 2 Gy
Session Number: 23

## 2023-09-12 ENCOUNTER — Other Ambulatory Visit: Payer: Self-pay | Admitting: Radiation Oncology

## 2023-09-12 ENCOUNTER — Ambulatory Visit: Payer: Medicare Other

## 2023-09-12 ENCOUNTER — Ambulatory Visit
Admission: RE | Admit: 2023-09-12 | Discharge: 2023-09-12 | Disposition: A | Discharge status: Home / Self Care | Payer: Medicare Other | Source: Ambulatory Visit | Attending: Radiation Oncology

## 2023-09-12 ENCOUNTER — Other Ambulatory Visit: Payer: Self-pay

## 2023-09-12 ENCOUNTER — Encounter: Payer: Self-pay | Admitting: *Deleted

## 2023-09-12 DIAGNOSIS — C342 Malignant neoplasm of middle lobe, bronchus or lung: Secondary | ICD-10-CM | POA: Diagnosis not present

## 2023-09-12 DIAGNOSIS — C3401 Malignant neoplasm of right main bronchus: Secondary | ICD-10-CM | POA: Diagnosis not present

## 2023-09-12 DIAGNOSIS — Z87891 Personal history of nicotine dependence: Secondary | ICD-10-CM | POA: Diagnosis not present

## 2023-09-12 DIAGNOSIS — Z510 Encounter for antineoplastic radiation therapy: Secondary | ICD-10-CM | POA: Diagnosis not present

## 2023-09-12 LAB — RAD ONC ARIA SESSION SUMMARY
Course Elapsed Days: 35
Plan Fractions Treated to Date: 24
Plan Prescribed Dose Per Fraction: 2 Gy
Plan Total Fractions Prescribed: 30
Plan Total Prescribed Dose: 60 Gy
Reference Point Dosage Given to Date: 48 Gy
Reference Point Session Dosage Given: 2 Gy
Session Number: 24

## 2023-09-13 ENCOUNTER — Inpatient Hospital Stay: Payer: Medicare Other

## 2023-09-13 ENCOUNTER — Other Ambulatory Visit: Payer: Self-pay

## 2023-09-13 ENCOUNTER — Other Ambulatory Visit: Payer: Medicare Other

## 2023-09-13 ENCOUNTER — Ambulatory Visit
Admission: RE | Admit: 2023-09-13 | Discharge: 2023-09-13 | Disposition: A | Discharge status: Home / Self Care | Payer: Medicare Other | Source: Ambulatory Visit | Attending: Radiation Oncology | Admitting: Radiation Oncology

## 2023-09-13 DIAGNOSIS — D509 Iron deficiency anemia, unspecified: Secondary | ICD-10-CM

## 2023-09-13 DIAGNOSIS — C3401 Malignant neoplasm of right main bronchus: Secondary | ICD-10-CM

## 2023-09-13 DIAGNOSIS — Z510 Encounter for antineoplastic radiation therapy: Secondary | ICD-10-CM | POA: Diagnosis not present

## 2023-09-13 DIAGNOSIS — D709 Neutropenia, unspecified: Secondary | ICD-10-CM | POA: Diagnosis not present

## 2023-09-13 DIAGNOSIS — Z79899 Other long term (current) drug therapy: Secondary | ICD-10-CM | POA: Diagnosis not present

## 2023-09-13 DIAGNOSIS — C342 Malignant neoplasm of middle lobe, bronchus or lung: Secondary | ICD-10-CM | POA: Diagnosis not present

## 2023-09-13 DIAGNOSIS — Z87891 Personal history of nicotine dependence: Secondary | ICD-10-CM | POA: Diagnosis not present

## 2023-09-13 DIAGNOSIS — Z5111 Encounter for antineoplastic chemotherapy: Secondary | ICD-10-CM | POA: Diagnosis not present

## 2023-09-13 DIAGNOSIS — F17210 Nicotine dependence, cigarettes, uncomplicated: Secondary | ICD-10-CM | POA: Diagnosis not present

## 2023-09-13 LAB — CMP (CANCER CENTER ONLY)
ALT: 9 U/L (ref 0–44)
AST: 15 U/L (ref 15–41)
Albumin: 3.3 g/dL — ABNORMAL LOW (ref 3.5–5.0)
Alkaline Phosphatase: 70 U/L (ref 38–126)
Anion gap: 5 (ref 5–15)
BUN: 17 mg/dL (ref 8–23)
CO2: 27 mmol/L (ref 22–32)
Calcium: 8.7 mg/dL — ABNORMAL LOW (ref 8.9–10.3)
Chloride: 107 mmol/L (ref 98–111)
Creatinine: 0.62 mg/dL (ref 0.44–1.00)
GFR, Estimated: >60 mL/min (ref >60)
Glucose, Bld: 91 mg/dL (ref 70–99)
Potassium: 4.5 mmol/L (ref 3.5–5.1)
Sodium: 139 mmol/L (ref 135–145)
Total Bilirubin: 0.3 mg/dL (ref <1.2)
Total Protein: 6.1 g/dL — ABNORMAL LOW (ref 6.5–8.1)

## 2023-09-13 LAB — CBC WITH DIFFERENTIAL (CANCER CENTER ONLY)
Abs Immature Granulocytes: 0.07 10*3/uL (ref 0.00–0.07)
Basophils Absolute: 0 10*3/uL (ref 0.0–0.1)
Basophils Relative: 0 %
Eosinophils Absolute: 0.1 10*3/uL (ref 0.0–0.5)
Eosinophils Relative: 3 %
HCT: 25.2 % — ABNORMAL LOW (ref 36.0–46.0)
Hemoglobin: 8.7 g/dL — ABNORMAL LOW (ref 12.0–15.0)
Immature Granulocytes: 2 %
Lymphocytes Relative: 8 %
Lymphs Abs: 0.3 10*3/uL — ABNORMAL LOW (ref 0.7–4.0)
MCH: 36.1 pg — ABNORMAL HIGH (ref 26.0–34.0)
MCHC: 34.5 g/dL (ref 30.0–36.0)
MCV: 104.6 fL — ABNORMAL HIGH (ref 80.0–100.0)
Monocytes Absolute: 0.1 10*3/uL (ref 0.1–1.0)
Monocytes Relative: 2 %
Neutro Abs: 2.8 10*3/uL (ref 1.7–7.7)
Neutrophils Relative %: 85 %
Platelet Count: 169 10*3/uL (ref 150–400)
RBC: 2.41 MIL/uL — ABNORMAL LOW (ref 3.87–5.11)
RDW: 15.9 % — ABNORMAL HIGH (ref 11.5–15.5)
Smear Review: NORMAL
WBC Count: 3.3 10*3/uL — ABNORMAL LOW (ref 4.0–10.5)
nRBC: 0 % (ref 0.0–0.2)

## 2023-09-13 LAB — RAD ONC ARIA SESSION SUMMARY
Course Elapsed Days: 36
Plan Fractions Treated to Date: 25
Plan Prescribed Dose Per Fraction: 2 Gy
Plan Total Fractions Prescribed: 30
Plan Total Prescribed Dose: 60 Gy
Reference Point Dosage Given to Date: 50 Gy
Reference Point Session Dosage Given: 2 Gy
Session Number: 25

## 2023-09-13 LAB — SAMPLE TO BLOOD BANK

## 2023-09-13 LAB — MAGNESIUM: Magnesium: 1.6 mg/dL — ABNORMAL LOW (ref 1.7–2.4)

## 2023-09-13 NOTE — Progress Notes (Signed)
Spoke with the patient following her radiation treatment.  She reports she continues to have irritation in her throat hindering her dietary intake.  She states over the weekend she did not feel well and her blood pressure was low.  Her vitals in the clinic today are better, she had lab work drawn that did not show signs of dehydration.  She was encouraged to push fluids and continue to check her blood pressure at home.  We discussed the use of chloraseptic spray, Mylanta, liquid ibuprofen/tylenol, and coating her pills with oil to help them go down easier.  She will continue to use the Carafate before each meal and at bedtime.  Hycet was offered but patient and daughter declined due to past reaction.  No other concerns voiced today.  They were encouraged to reach out to our office should they need something.  Patient and daughter verbalized understanding.   BP (!) 113/47   Pulse 94   Temp 97.6 F (36.4 C)   Resp 20   SpO2 100%

## 2023-09-14 ENCOUNTER — Ambulatory Visit
Admission: RE | Admit: 2023-09-14 | Discharge: 2023-09-14 | Disposition: A | Discharge status: Home / Self Care | Payer: Medicare Other | Source: Ambulatory Visit | Attending: Radiation Oncology | Admitting: Radiation Oncology

## 2023-09-14 ENCOUNTER — Other Ambulatory Visit: Payer: Self-pay

## 2023-09-14 ENCOUNTER — Telehealth: Payer: Self-pay

## 2023-09-14 DIAGNOSIS — C3401 Malignant neoplasm of right main bronchus: Secondary | ICD-10-CM | POA: Diagnosis not present

## 2023-09-14 DIAGNOSIS — Z510 Encounter for antineoplastic radiation therapy: Secondary | ICD-10-CM | POA: Diagnosis not present

## 2023-09-14 DIAGNOSIS — Z87891 Personal history of nicotine dependence: Secondary | ICD-10-CM | POA: Diagnosis not present

## 2023-09-14 DIAGNOSIS — C342 Malignant neoplasm of middle lobe, bronchus or lung: Secondary | ICD-10-CM | POA: Diagnosis not present

## 2023-09-14 LAB — RAD ONC ARIA SESSION SUMMARY
Course Elapsed Days: 37
Plan Fractions Treated to Date: 26
Plan Prescribed Dose Per Fraction: 2 Gy
Plan Total Fractions Prescribed: 30
Plan Total Prescribed Dose: 60 Gy
Reference Point Dosage Given to Date: 52 Gy
Reference Point Session Dosage Given: 2 Gy
Session Number: 26

## 2023-09-14 NOTE — Telephone Encounter (Addendum)
Spoke with pts daughter about the below.  Pt's daughter stated that she takes Magnesium once daily.  Informed her to increase magnesium to twice daily. Scheduled pt for weekly labs next week.  Pt's daughter verbalized understanding.   ----- Message from Cassandra L Heilingoetter sent at 09/13/2023  4:50 PM EST ----- Can you tell her to continue her magnesium. If she is just taking once per day, tell her to take twice per day. Will continue to watch her Hbg. No blood transfusion needed now but will consider if Hbg is <8. ----- Message ----- From: Interface, Lab In [facility] Sent: 09/13/2023  10:40 AM EST To: Cassandra L Heilingoetter, PA-C

## 2023-09-18 ENCOUNTER — Ambulatory Visit
Admission: RE | Admit: 2023-09-18 | Discharge: 2023-09-18 | Disposition: A | Discharge status: Home / Self Care | Payer: Medicare Other | Source: Ambulatory Visit | Attending: Radiation Oncology | Admitting: Radiation Oncology

## 2023-09-18 DIAGNOSIS — Z87891 Personal history of nicotine dependence: Secondary | ICD-10-CM | POA: Insufficient documentation

## 2023-09-18 DIAGNOSIS — C3401 Malignant neoplasm of right main bronchus: Secondary | ICD-10-CM | POA: Insufficient documentation

## 2023-09-18 DIAGNOSIS — Z510 Encounter for antineoplastic radiation therapy: Secondary | ICD-10-CM | POA: Insufficient documentation

## 2023-09-18 DIAGNOSIS — C342 Malignant neoplasm of middle lobe, bronchus or lung: Secondary | ICD-10-CM | POA: Diagnosis not present

## 2023-09-19 ENCOUNTER — Other Ambulatory Visit: Payer: Medicare Other

## 2023-09-19 ENCOUNTER — Ambulatory Visit: Payer: Medicare Other | Admitting: Physician Assistant

## 2023-09-19 ENCOUNTER — Ambulatory Visit
Admission: RE | Admit: 2023-09-19 | Discharge: 2023-09-19 | Disposition: A | Discharge status: Home / Self Care | Payer: Medicare Other | Source: Ambulatory Visit | Attending: Radiation Oncology

## 2023-09-19 ENCOUNTER — Ambulatory Visit: Payer: Medicare Other

## 2023-09-19 ENCOUNTER — Other Ambulatory Visit: Payer: Self-pay

## 2023-09-19 ENCOUNTER — Encounter: Payer: Medicare Other | Admitting: Nutrition

## 2023-09-19 DIAGNOSIS — C342 Malignant neoplasm of middle lobe, bronchus or lung: Secondary | ICD-10-CM | POA: Diagnosis not present

## 2023-09-19 DIAGNOSIS — Z87891 Personal history of nicotine dependence: Secondary | ICD-10-CM | POA: Diagnosis not present

## 2023-09-19 DIAGNOSIS — C3401 Malignant neoplasm of right main bronchus: Secondary | ICD-10-CM | POA: Diagnosis not present

## 2023-09-19 DIAGNOSIS — Z510 Encounter for antineoplastic radiation therapy: Secondary | ICD-10-CM | POA: Diagnosis not present

## 2023-09-19 LAB — RAD ONC ARIA SESSION SUMMARY
Course Elapsed Days: 42
Plan Fractions Treated to Date: 27
Plan Prescribed Dose Per Fraction: 2 Gy
Plan Total Fractions Prescribed: 30
Plan Total Prescribed Dose: 60 Gy
Reference Point Dosage Given to Date: 54 Gy
Reference Point Session Dosage Given: 2 Gy
Session Number: 27

## 2023-09-20 ENCOUNTER — Ambulatory Visit: Payer: Medicare Other

## 2023-09-20 ENCOUNTER — Other Ambulatory Visit: Payer: Self-pay

## 2023-09-20 ENCOUNTER — Ambulatory Visit
Admission: RE | Admit: 2023-09-20 | Discharge: 2023-09-20 | Disposition: A | Discharge status: Home / Self Care | Payer: Medicare Other | Source: Ambulatory Visit | Attending: Radiation Oncology

## 2023-09-20 DIAGNOSIS — Z510 Encounter for antineoplastic radiation therapy: Secondary | ICD-10-CM | POA: Diagnosis not present

## 2023-09-20 DIAGNOSIS — C342 Malignant neoplasm of middle lobe, bronchus or lung: Secondary | ICD-10-CM | POA: Diagnosis not present

## 2023-09-20 DIAGNOSIS — Z87891 Personal history of nicotine dependence: Secondary | ICD-10-CM | POA: Diagnosis not present

## 2023-09-20 DIAGNOSIS — C3401 Malignant neoplasm of right main bronchus: Secondary | ICD-10-CM | POA: Diagnosis not present

## 2023-09-20 LAB — RAD ONC ARIA SESSION SUMMARY
Course Elapsed Days: 43
Plan Fractions Treated to Date: 28
Plan Prescribed Dose Per Fraction: 2 Gy
Plan Total Fractions Prescribed: 30
Plan Total Prescribed Dose: 60 Gy
Reference Point Dosage Given to Date: 56 Gy
Reference Point Session Dosage Given: 2 Gy
Session Number: 28

## 2023-09-21 ENCOUNTER — Other Ambulatory Visit: Payer: Self-pay

## 2023-09-21 ENCOUNTER — Other Ambulatory Visit: Payer: Self-pay | Admitting: Physician Assistant

## 2023-09-21 ENCOUNTER — Ambulatory Visit
Admission: RE | Admit: 2023-09-21 | Discharge: 2023-09-21 | Disposition: A | Discharge status: Home / Self Care | Payer: Medicare Other | Source: Ambulatory Visit | Attending: Radiation Oncology

## 2023-09-21 ENCOUNTER — Telehealth: Payer: Self-pay

## 2023-09-21 ENCOUNTER — Ambulatory Visit: Payer: Medicare Other

## 2023-09-21 ENCOUNTER — Inpatient Hospital Stay: Payer: Medicare Other

## 2023-09-21 ENCOUNTER — Inpatient Hospital Stay: Payer: Medicare Other | Attending: Physician Assistant

## 2023-09-21 VITALS — BP 96/51 | HR 100 | Temp 97.8°F | Resp 18

## 2023-09-21 DIAGNOSIS — C3401 Malignant neoplasm of right main bronchus: Secondary | ICD-10-CM | POA: Insufficient documentation

## 2023-09-21 DIAGNOSIS — Z79899 Other long term (current) drug therapy: Secondary | ICD-10-CM | POA: Diagnosis not present

## 2023-09-21 DIAGNOSIS — C342 Malignant neoplasm of middle lobe, bronchus or lung: Secondary | ICD-10-CM | POA: Diagnosis not present

## 2023-09-21 DIAGNOSIS — T451X5A Adverse effect of antineoplastic and immunosuppressive drugs, initial encounter: Secondary | ICD-10-CM

## 2023-09-21 DIAGNOSIS — Z510 Encounter for antineoplastic radiation therapy: Secondary | ICD-10-CM | POA: Diagnosis not present

## 2023-09-21 DIAGNOSIS — D709 Neutropenia, unspecified: Secondary | ICD-10-CM | POA: Diagnosis not present

## 2023-09-21 DIAGNOSIS — D508 Other iron deficiency anemias: Secondary | ICD-10-CM

## 2023-09-21 DIAGNOSIS — Z87891 Personal history of nicotine dependence: Secondary | ICD-10-CM | POA: Diagnosis not present

## 2023-09-21 DIAGNOSIS — Z5111 Encounter for antineoplastic chemotherapy: Secondary | ICD-10-CM | POA: Insufficient documentation

## 2023-09-21 DIAGNOSIS — D61810 Antineoplastic chemotherapy induced pancytopenia: Secondary | ICD-10-CM | POA: Insufficient documentation

## 2023-09-21 DIAGNOSIS — Z5189 Encounter for other specified aftercare: Secondary | ICD-10-CM | POA: Insufficient documentation

## 2023-09-21 DIAGNOSIS — D649 Anemia, unspecified: Secondary | ICD-10-CM | POA: Insufficient documentation

## 2023-09-21 DIAGNOSIS — D509 Iron deficiency anemia, unspecified: Secondary | ICD-10-CM

## 2023-09-21 LAB — RAD ONC ARIA SESSION SUMMARY
Course Elapsed Days: 44
Plan Fractions Treated to Date: 29
Plan Prescribed Dose Per Fraction: 2 Gy
Plan Total Fractions Prescribed: 30
Plan Total Prescribed Dose: 60 Gy
Reference Point Dosage Given to Date: 58 Gy
Reference Point Session Dosage Given: 2 Gy
Session Number: 29

## 2023-09-21 LAB — CMP (CANCER CENTER ONLY)
ALT: 5 U/L (ref 0–44)
AST: 6 U/L — ABNORMAL LOW (ref 15–41)
Albumin: 3.4 g/dL — ABNORMAL LOW (ref 3.5–5.0)
Alkaline Phosphatase: 95 U/L (ref 38–126)
Anion gap: 7 (ref 5–15)
BUN: 12 mg/dL (ref 8–23)
CO2: 25 mmol/L (ref 22–32)
Calcium: 9 mg/dL (ref 8.9–10.3)
Chloride: 107 mmol/L (ref 98–111)
Creatinine: 0.67 mg/dL (ref 0.44–1.00)
GFR, Estimated: >60 mL/min (ref >60)
Glucose, Bld: 93 mg/dL (ref 70–99)
Potassium: 4.3 mmol/L (ref 3.5–5.1)
Sodium: 139 mmol/L (ref 135–145)
Total Bilirubin: 0.2 mg/dL (ref <1.2)
Total Protein: 6.2 g/dL — ABNORMAL LOW (ref 6.5–8.1)

## 2023-09-21 LAB — MAGNESIUM: Magnesium: 1.8 mg/dL (ref 1.7–2.4)

## 2023-09-21 LAB — CBC WITH DIFFERENTIAL (CANCER CENTER ONLY)
Abs Immature Granulocytes: 0 10*3/uL (ref 0.00–0.07)
Basophils Absolute: 0 10*3/uL (ref 0.0–0.1)
Basophils Relative: 0 %
Eosinophils Absolute: 0 10*3/uL (ref 0.0–0.5)
Eosinophils Relative: 6 %
HCT: 22.9 % — ABNORMAL LOW (ref 36.0–46.0)
Hemoglobin: 7.6 g/dL — ABNORMAL LOW (ref 12.0–15.0)
Immature Granulocytes: 0 %
Lymphocytes Relative: 38 %
Lymphs Abs: 0.2 10*3/uL — ABNORMAL LOW (ref 0.7–4.0)
MCH: 34.7 pg — ABNORMAL HIGH (ref 26.0–34.0)
MCHC: 33.2 g/dL (ref 30.0–36.0)
MCV: 104.6 fL — ABNORMAL HIGH (ref 80.0–100.0)
Monocytes Absolute: 0.2 10*3/uL (ref 0.1–1.0)
Monocytes Relative: 46 %
Neutro Abs: 0.1 10*3/uL — CL (ref 1.7–7.7)
Neutrophils Relative %: 10 %
Platelet Count: 38 10*3/uL — ABNORMAL LOW (ref 150–400)
RBC: 2.19 MIL/uL — ABNORMAL LOW (ref 3.87–5.11)
RDW: 15.1 % (ref 11.5–15.5)
Smear Review: NORMAL
WBC Count: 0.5 10*3/uL — CL (ref 4.0–10.5)
nRBC: 0 % (ref 0.0–0.2)

## 2023-09-21 LAB — PREPARE RBC (CROSSMATCH)

## 2023-09-21 LAB — SAMPLE TO BLOOD BANK

## 2023-09-21 MED ORDER — FILGRASTIM-SNDZ 300 MCG/0.5ML IJ SOSY
300.0000 ug | PREFILLED_SYRINGE | Freq: Once | INTRAMUSCULAR | Status: AC
Start: 2023-09-21 — End: 2023-09-21
  Administered 2023-09-21: 300 ug via SUBCUTANEOUS
  Filled 2023-09-21: qty 0.5

## 2023-09-21 NOTE — Progress Notes (Unsigned)
CRITICAL VALUE STICKER  CRITICAL VALUE: WBC 0.5  RECEIVER (on-site recipient of call): Morrie Sheldon   DATE & TIME NOTIFIED: 12/4 @ 10:00  MESSENGER (representative from lab): Shanda Bumps   MD NOTIFIED: Cassie, PA  TIME OF NOTIFICATION: 10:12  RESPONSE: Zarxio ordered for 3 days

## 2023-09-21 NOTE — Telephone Encounter (Signed)
This RN called and spoke with patient to verify upcoming blood transfusion appointment 12/4 at 0930. Patient verbalized understanding of appointment time and knows to keep her blue armband on.

## 2023-09-22 ENCOUNTER — Ambulatory Visit: Payer: Medicare Other

## 2023-09-22 ENCOUNTER — Inpatient Hospital Stay: Payer: Medicare Other

## 2023-09-22 VITALS — BP 100/43 | HR 89 | Temp 98.0°F | Resp 18 | Wt 85.7 lb

## 2023-09-22 DIAGNOSIS — Z5111 Encounter for antineoplastic chemotherapy: Secondary | ICD-10-CM | POA: Diagnosis not present

## 2023-09-22 DIAGNOSIS — D508 Other iron deficiency anemias: Secondary | ICD-10-CM

## 2023-09-22 DIAGNOSIS — T451X5A Adverse effect of antineoplastic and immunosuppressive drugs, initial encounter: Secondary | ICD-10-CM

## 2023-09-22 MED ORDER — FILGRASTIM-SNDZ 300 MCG/0.5ML IJ SOSY
300.0000 ug | PREFILLED_SYRINGE | Freq: Once | INTRAMUSCULAR | Status: AC
  Administered 2023-09-22: 300 ug via SUBCUTANEOUS
  Filled 2023-09-22: qty 0.5

## 2023-09-22 MED ORDER — SODIUM CHLORIDE 0.9% IV SOLUTION
250.0000 mL | INTRAVENOUS | Status: DC
  Administered 2023-09-22: 250 mL via INTRAVENOUS

## 2023-09-22 MED ORDER — ACETAMINOPHEN 325 MG PO TABS
650.0000 mg | ORAL_TABLET | Freq: Once | ORAL | Status: AC
  Administered 2023-09-22: 650 mg via ORAL
  Filled 2023-09-22: qty 2

## 2023-09-22 MED ORDER — DIPHENHYDRAMINE HCL 25 MG PO CAPS
25.0000 mg | ORAL_CAPSULE | Freq: Once | ORAL | Status: AC
  Administered 2023-09-22: 25 mg via ORAL
  Filled 2023-09-22: qty 1

## 2023-09-22 NOTE — Patient Instructions (Signed)

## 2023-09-23 ENCOUNTER — Inpatient Hospital Stay: Payer: Medicare Other

## 2023-09-23 ENCOUNTER — Ambulatory Visit: Payer: Medicare Other

## 2023-09-23 ENCOUNTER — Other Ambulatory Visit: Payer: Self-pay

## 2023-09-23 ENCOUNTER — Ambulatory Visit
Admission: RE | Admit: 2023-09-23 | Discharge: 2023-09-23 | Disposition: A | Discharge status: Home / Self Care | Payer: Medicare Other | Source: Ambulatory Visit | Attending: Radiation Oncology

## 2023-09-23 VITALS — BP 102/44 | HR 99 | Temp 97.7°F | Resp 18

## 2023-09-23 DIAGNOSIS — C342 Malignant neoplasm of middle lobe, bronchus or lung: Secondary | ICD-10-CM | POA: Diagnosis not present

## 2023-09-23 DIAGNOSIS — Z87891 Personal history of nicotine dependence: Secondary | ICD-10-CM | POA: Diagnosis not present

## 2023-09-23 DIAGNOSIS — Z510 Encounter for antineoplastic radiation therapy: Secondary | ICD-10-CM | POA: Diagnosis not present

## 2023-09-23 DIAGNOSIS — C3401 Malignant neoplasm of right main bronchus: Secondary | ICD-10-CM | POA: Diagnosis not present

## 2023-09-23 DIAGNOSIS — Z5111 Encounter for antineoplastic chemotherapy: Secondary | ICD-10-CM | POA: Diagnosis not present

## 2023-09-23 DIAGNOSIS — D508 Other iron deficiency anemias: Secondary | ICD-10-CM

## 2023-09-23 DIAGNOSIS — D701 Agranulocytosis secondary to cancer chemotherapy: Secondary | ICD-10-CM

## 2023-09-23 LAB — BPAM RBC
Blood Product Expiration Date: 202412202359
ISSUE DATE / TIME: 202412050953
Unit Type and Rh: 7300

## 2023-09-23 LAB — CBC WITH DIFFERENTIAL (CANCER CENTER ONLY)
Abs Immature Granulocytes: 0.16 10*3/uL — ABNORMAL HIGH (ref 0.00–0.07)
Basophils Absolute: 0.1 10*3/uL (ref 0.0–0.1)
Basophils Relative: 1 %
Eosinophils Absolute: 0.1 10*3/uL (ref 0.0–0.5)
Eosinophils Relative: 1 %
HCT: 28.1 % — ABNORMAL LOW (ref 36.0–46.0)
Hemoglobin: 9.4 g/dL — ABNORMAL LOW (ref 12.0–15.0)
Immature Granulocytes: 2 %
Lymphocytes Relative: 5 %
Lymphs Abs: 0.5 10*3/uL — ABNORMAL LOW (ref 0.7–4.0)
MCH: 34.1 pg — ABNORMAL HIGH (ref 26.0–34.0)
MCHC: 33.5 g/dL (ref 30.0–36.0)
MCV: 101.8 fL — ABNORMAL HIGH (ref 80.0–100.0)
Monocytes Absolute: 1.4 10*3/uL — ABNORMAL HIGH (ref 0.1–1.0)
Monocytes Relative: 15 %
Neutro Abs: 7.2 10*3/uL (ref 1.7–7.7)
Neutrophils Relative %: 76 %
Platelet Count: 63 10*3/uL — ABNORMAL LOW (ref 150–400)
RBC: 2.76 MIL/uL — ABNORMAL LOW (ref 3.87–5.11)
RDW: 18.6 % — ABNORMAL HIGH (ref 11.5–15.5)
Smear Review: NORMAL
WBC Count: 9.3 10*3/uL (ref 4.0–10.5)
nRBC: 1.2 % — ABNORMAL HIGH (ref 0.0–0.2)

## 2023-09-23 LAB — TYPE AND SCREEN
ABO/RH(D): B POS
Antibody Screen: NEGATIVE
Unit division: 0

## 2023-09-23 LAB — RAD ONC ARIA SESSION SUMMARY
Course Elapsed Days: 46
Plan Fractions Treated to Date: 30
Plan Prescribed Dose Per Fraction: 2 Gy
Plan Total Fractions Prescribed: 30
Plan Total Prescribed Dose: 60 Gy
Reference Point Dosage Given to Date: 60 Gy
Reference Point Session Dosage Given: 2 Gy
Session Number: 30

## 2023-09-23 MED ORDER — FILGRASTIM-SNDZ 300 MCG/0.5ML IJ SOSY
300.0000 ug | PREFILLED_SYRINGE | Freq: Once | INTRAMUSCULAR | Status: AC
  Administered 2023-09-23: 300 ug via SUBCUTANEOUS
  Filled 2023-09-23: qty 0.5

## 2023-09-24 NOTE — Progress Notes (Unsigned)
[facility] OFFICE PROGRESS NOTE  [name], MD [address] [place] [address]  DIAGNOSIS: Limited stage small cell lung cancer (T1c, N2, M0) small cell lung cancer. The patient presented with a hilar mass and right paratracheal lymphadenopathy. She was diagnosed in October 2024.   PRIOR THERAPY: None  CURRENT THERAPY: Systemic chemotherapy with cisplatin on day 1 and etoposide 100 mg/m on days 1, 2, and 3 IV every 3 weeks.  This will be with concurrent radiation.  First dose on 08/08/2023. Status post 2 cycles.   INTERVAL HISTORY: [name] 67 y.o. female returns to the clinic today for a follow-up visit accompanied by her daughter.  The patient was recently diagnosed with limited stage small cell lung cancer.  She underwent her first cycle of chemotherapy. and tolerated it well overall except she experiences cytopenias. She requires GCSF injections. Additionally, she required a unit of blood in the interval. She felt better after her blood transfusion. Her fatigue is improved compared to last week.  Denies any abnormal bleeding or bruising.  She also denies any signs and symptoms of infection.  The patient is looking forward to having her last day radiation next week.  She is having some difficulty swallowing for which she uses Maalox, Chloraseptic spray/lozenges, and Carafate.  Overall though she is maintained her weight for the most part (her and her daughter attributes somewhat her weight loss to loss of fluid weight).  She does have some oral thrush on exam today.  She was seen by a member the nutritionist team in the interval.  They were scheduled to see her this week but her care plan will be delayed by 1 week due to thrombocytopenia today.  Overall her appetite is up-and-down.  She had some issues around Thanksgiving due to the radiation esophagitis but with having 4 days off of radiation due to the holiday she did have improvement in her  swallowing.  She is drinking protein supplemental drinks 3 times per day.  She is compliant with her iron supplement and magnesium twice a day.   She denies any other signs of infection including fever, upper respiratory infection symptoms, urinary discomfort, diarrhea, or abdominal pain.    The patient denies any changes in her usual shortness of breath with exertion.  She does have an intermittent cough.  She has not needed to take anything for cough.  She reports no unusual headaches or numbness and tingling in her hands and feet.  She reports mild nausea that is controlled with her Compazine and Zofran.  She denies any vomiting.  Denies any diarrhea or constipation.  She is here for evaluation and repeat blood work before undergoing cycle #3.   MEDICAL HISTORY: Past Medical History:  Diagnosis Date   Allergy    Anemia    Arthritis    Asthma    Blood transfusion without reported diagnosis    Cataract    COPD (chronic obstructive pulmonary disease) (HCC)    Duodenal ulcer    Emphysema of lung (HCC)    Family history of malignant neoplasm of gastrointestinal tract    GERD (gastroesophageal reflux disease)    Hepatitis    had in 6th grade pt unsure which kind   Hiatal hernia    HTN (hypertension) 10/03/2014   Hx of colonic polyps    Hyperlipemia    Internal hemorrhoids    Macular degeneration    Osteoporosis    Peripheral vascular disease (HCC)  Pneumonia    hx of   Shortness of breath dyspnea    with ambulation   Stroke [facility])    Vitamin D deficiency     ALLERGIES:  is allergic to decadron [dexamethasone], celebrex [celecoxib], crestor [rosuvastatin], doxycycline, penicillins, pravastatin, and hydrocodone-acetaminophen.  MEDICATIONS:  Current Outpatient Medications  Medication Sig Dispense Refill   nystatin (MYCOSTATIN) 100000 UNIT/ML suspension Take 5 mLs (500,000 Units total) by mouth 4 (four) times daily. 60 mL 0   albuterol (PROVENTIL) (2.5 MG/3ML) 0.083%  nebulizer solution Take 3 mLs (2.5 mg total) by nebulization every 6 (six) hours as needed for wheezing or shortness of breath. 75 mL 12   albuterol (VENTOLIN HFA) 108 (90 Base) MCG/ACT inhaler 2 Inhalations 15 to 20  minutes apart every 4 hours to Rescue Asthma 18 g 3   Ascorbic Acid (VITAMIN C) 1000 MG tablet Take 1,000 mg by mouth daily. Takes 7,000mg      aspirin EC 81 MG tablet Take 1 tablet (81 mg total) by mouth daily. 30 tablet 0   Cholecalciferol (VITAMIN D) 50 MCG (2000 UT) CAPS Take by mouth. Takes 6,000 units     ezetimibe (ZETIA) 10 MG tablet Take 1 tablet (10 mg total) by mouth daily. 30 tablet 11   FeFum-FePoly-FA-B Cmp-C-Biot (INTEGRA PLUS) CAPS Take 1 capsule by mouth every morning. 30 capsule 2   fluticasone-salmeterol (ADVAIR DISKUS) 250-50 MCG/ACT AEPB Inhale 1 puff into the lungs in the morning and at bedtime. 60 each 1   ipratropium-albuterol (DUONEB) 0.5-2.5 (3) MG/3ML SOLN Take 3 mLs by nebulization every 4 (four) hours as needed. Max:6 doses per day 540 mL 0   iron polysaccharides (NIFEREX) 150 MG capsule Take 1 capsule (150 mg total) by mouth daily. 30 capsule 2   IRON, FERROUS SULFATE, PO Take 65 mg by mouth 2 (two) times daily.     magic mouthwash (nystatin, diphenhydrAMINE, alum & mag hydroxide) suspension mixture Take 5 mLs by mouth 3 (three) times daily as needed for mouth pain. 240 mL 0   magnesium oxide (MAG-OX) 400 (240 Mg) MG tablet Take 1 tablet (400 mg total) by mouth daily. 30 tablet 1   omeprazole (PRILOSEC) 20 MG capsule TAKE 1 CAPSULE BY MOUTH TWICE DAILY TO PREVENT INDIGESTION & HEARTBURN 180 capsule 1   ondansetron (ZOFRAN) 8 MG tablet Take 1 tablet (8 mg total) by mouth every 8 (eight) hours as needed for nausea or vomiting. Starting 3 days after chemotherapy 30 tablet 1   prochlorperazine (COMPAZINE) 10 MG tablet Take 1 tablet (10 mg total) by mouth every 6 (six) hours as needed. 30 tablet 2   sucralfate (CARAFATE) 1 g tablet Take 1 tablet (1 g total) by  mouth 4 (four) times daily. Dissolve each tablet in 15 cc water before use. 120 tablet 2   zinc gluconate 50 MG tablet Take 50 mg by mouth daily.     No current facility-administered medications for this visit.    SURGICAL HISTORY:  Past Surgical History:  Procedure Laterality Date   ABDOMINAL HYSTERECTOMY     APPENDECTOMY     CATARACT EXTRACTION, BILATERAL     COLONOSCOPY     ELBOW SURGERY Right    ENDARTERECTOMY Right 12/16/2014   Procedure: ENDARTERECTOMY CAROTID with Dacron patch;  Surgeon: Larina Earthly, MD;  Location: [facility];  Service: Vascular;  Laterality: Right;   ESOPHAGOGASTRODUODENOSCOPY  10/2008   FINE NEEDLE ASPIRATION BIOPSY  07/25/2023   Procedure: FINE NEEDLE ASPIRATION BIOPSY;  Surgeon: Leslye Peer,  MD;  Location: [facility] ENDOSCOPY;  Service: Pulmonary;;   HAND SURGERY Left    LUMBAR DISC SURGERY     TOTAL HIP ARTHROPLASTY Right    trigeminal neuralgia surgery     UPPER GASTROINTESTINAL ENDOSCOPY     VIDEO BRONCHOSCOPY WITH ENDOBRONCHIAL ULTRASOUND N/A 07/25/2023   Procedure: VIDEO BRONCHOSCOPY WITH ENDOBRONCHIAL ULTRASOUND;  Surgeon: Leslye Peer, MD;  Location: [facility] ENDOSCOPY;  Service: Pulmonary;  Laterality: N/A;   WRIST SURGERY Left     REVIEW OF SYSTEMS:   Review of Systems  Constitutional: Positive for fatigue (better than prior to blood transfusion). Positive for appetite change. Negative for chills and fever.   HENT:  Positive for oral thrush. Positive for some radiation esophagitis. Negative for mouth sores, nosebleeds, sore throat. Eyes: Negative for eye problems and icterus.  Respiratory: Positive for stable dyspnea on exertion. Positive for cough. Negative for hemoptysis and wheezing.   Cardiovascular: Negative for chest pain and leg swelling.  Gastrointestinal: Positive for mild nausea with treatment. Negative for abdominal pain, constipation, diarrhea, and vomiting.  Genitourinary: Negative for bladder incontinence, difficulty urinating, dysuria,  frequency and hematuria.   Musculoskeletal: Negative for back pain, gait problem, neck pain and neck stiffness.  Skin: Negative for itching and rash.  Neurological: Negative for dizziness, extremity weakness, gait problem, headaches, light-headedness and seizures.  Hematological: Negative for adenopathy. Does not bruise/bleed easily.  Psychiatric/Behavioral: Negative for confusion, depression and sleep disturbance. The patient is not nervous/anxious.     PHYSICAL EXAMINATION:  Blood pressure 117/61, pulse 91, temperature 98.3 F (36.8 C), temperature source Temporal, resp. rate 16, weight 84 lb 9.6 oz (38.4 kg), SpO2 100%.  ECOG PERFORMANCE STATUS: 1  Physical Exam  Constitutional: Oriented to person, place, and time and thin appearing female, and in no distress.  HENT:  Head: Normocephalic and atraumatic.  Mouth/Throat: Oropharynx is clear and moist. No oropharyngeal exudate.  Eyes: Conjunctivae are normal. Right eye exhibits no discharge. Left eye exhibits no discharge. No scleral icterus.  Neck: Normal range of motion. Neck supple.  Cardiovascular: Normal rate, regular rhythm, normal heart sounds and intact distal pulses.   Pulmonary/Chest: Effort normal and breath sounds normal. No respiratory distress. No wheezes. No rales.  Abdominal: Soft. Bowel sounds are normal. Exhibits no distension and no mass. There is no tenderness.  Musculoskeletal: Normal range of motion. Exhibits no edema.  Lymphadenopathy:    No cervical adenopathy.  Neurological: Alert and oriented to person, place, and time. Exhibits muscle wasting. Gait normal. Coordination normal.  Skin: Skin is warm and dry.  The prior blister on her left hand is improving but is still healing.  No drainage and the patient's daughter denies any more heat or pain.  Not diaphoretic.No pallor.  Psychiatric: Mood, memory and judgment normal.  Vitals reviewed.  LABORATORY DATA: Lab Results  Component Value Date   WBC 4.5  09/26/2023   HGB 9.2 (L) 09/26/2023   HCT 28.0 (L) 09/26/2023   MCV 102.9 (H) 09/26/2023   PLT 73 (L) 09/26/2023      Chemistry      Component Value Date/Time   NA 139 09/21/2023 0942   NA 141 09/02/2016 1147   K 4.3 09/21/2023 0942   K 4.5 09/02/2016 1147   CL 107 09/21/2023 0942   CO2 25 09/21/2023 0942   CO2 20 (L) 09/02/2016 1147   BUN 12 09/21/2023 0942   BUN 13.1 09/02/2016 1147   CREATININE 0.67 09/21/2023 0942   CREATININE 0.70 05/11/2023 0930  CREATININE 0.8 09/02/2016 1147      Component Value Date/Time   CALCIUM 9.0 09/21/2023 0942   CALCIUM 9.7 09/02/2016 1147   ALKPHOS 95 09/21/2023 0942   ALKPHOS 77 09/02/2016 1147   AST 6 (L) 09/21/2023 0942   AST 16 09/02/2016 1147   ALT 5 09/21/2023 0942   ALT 9 09/02/2016 1147   BILITOT 0.2 09/21/2023 0942   BILITOT <0.22 09/02/2016 1147       RADIOGRAPHIC STUDIES:  No results found.   ASSESSMENT/PLAN:  This is a very pleasant 67 year old Caucasian female with newly diagnosed limited stage small cell lung cancer (T1c, N2, M0) small cell lung cancer.  The patient presented with a hilar mass and right paratracheal lymphadenopathy.  She was diagnosed in October 2024.    She is currently undergoing radiation and chemotherapy with cisplatin on day 1 and etoposide 100 mg/m on days 1, 2, and 3 IV every 3 weeks.  She underwent her first cycle of treatment on 08/08/2023. She is statu post 2 cycles.   She did require GCSF injections in the interval. Dr. Arbutus Ped would not recommend long acting GCSF due to the risk of pneumonitis with radiation and we would continue with short acting GCSF as needed.    She did require a blood transfusion, G-CSF injections in the interval.  Her platelet count got down to 38k.   He received a blood transfusion last week which helped her energy.  She also received G-CSF injections and denied any signs and symptoms of infection.  From reviewing her labs today her platelet count is 73k.  Her  labs are not adequate for treatment today.  Therefore we will defer treatment by 1 week.  The patient does not like the way Medrol Dosepak's make her feel so I will not send her a Medrol Dosepak for her platelet count today.  We will allow it to recover on its own.   I will discuss with Dr. Arbutus Ped her doses of etoposide and cisplatin for her infusion next week.  As long as her labs are within parameters, she will proceed with cycle #3 at that time.  We will then see her in 4 weeks from now with cycle #4 and discuss ordering a restaging CT scan at that time.  We will continue to monitor her labs closely on a weekly basis.  The patient does live near [place].  If she ever has signs and symptoms of anemia needs a same-day lab appointment, we be happy to arrange this at the [facility] cancer center.  She will continue with her iron supplement. I previously have added sample blood bank's to be drawn on a weekly basis should she ever require a blood transfusion. We would set her blood transfusion if her hemoglobin were less than 8.   Would arrange for G-CSF injections if her ANC 0.6 or less.  She will continue taking her magnesium twice daily.  I sent her nystatin for her oral thrush.  She will continue drinking her protein supplemental drinks.  The patient was advised to call immediately if she has any concerning symptoms in the interval. The patient voices understanding of current disease status and treatment options and is in agreement with the current care plan. All questions were answered. The patient knows to call the clinic with any problems, questions or concerns. We can certainly see the patient much sooner if necessary         Orders Placed This Encounter  Procedures   CBC with  Differential (Cancer Center Only)    Standing Status:   Future    Standing Expiration Date:   10/02/2024   CMP (Cancer Center only)    Standing Status:   Future    Standing Expiration Date:    10/02/2024   Magnesium    Standing Status:   Future    Standing Expiration Date:   10/02/2024     The total time spent in the appointment was 30-39 minutes  Syenna Nazir L Emmilynn Marut, PA-C 09/26/23

## 2023-09-26 ENCOUNTER — Inpatient Hospital Stay: Payer: Medicare Other

## 2023-09-26 ENCOUNTER — Ambulatory Visit
Admission: RE | Admit: 2023-09-26 | Discharge: 2023-09-26 | Disposition: A | Discharge status: Home / Self Care | Payer: Medicare Other | Source: Ambulatory Visit | Attending: Radiation Oncology

## 2023-09-26 ENCOUNTER — Ambulatory Visit: Payer: Medicare Other

## 2023-09-26 ENCOUNTER — Encounter: Payer: Medicare Other | Admitting: Nutrition

## 2023-09-26 ENCOUNTER — Other Ambulatory Visit: Payer: Self-pay

## 2023-09-26 ENCOUNTER — Other Ambulatory Visit: Payer: Self-pay | Admitting: Internal Medicine

## 2023-09-26 ENCOUNTER — Inpatient Hospital Stay (HOSPITAL_BASED_OUTPATIENT_CLINIC_OR_DEPARTMENT_OTHER): Payer: Medicare Other | Admitting: Physician Assistant

## 2023-09-26 VITALS — BP 117/61 | HR 91 | Temp 98.3°F | Resp 16 | Wt 84.6 lb

## 2023-09-26 DIAGNOSIS — Z87891 Personal history of nicotine dependence: Secondary | ICD-10-CM | POA: Diagnosis not present

## 2023-09-26 DIAGNOSIS — D6481 Anemia due to antineoplastic chemotherapy: Secondary | ICD-10-CM

## 2023-09-26 DIAGNOSIS — B370 Candidal stomatitis: Secondary | ICD-10-CM

## 2023-09-26 DIAGNOSIS — Z5111 Encounter for antineoplastic chemotherapy: Secondary | ICD-10-CM | POA: Diagnosis not present

## 2023-09-26 DIAGNOSIS — C3401 Malignant neoplasm of right main bronchus: Secondary | ICD-10-CM

## 2023-09-26 DIAGNOSIS — Z510 Encounter for antineoplastic radiation therapy: Secondary | ICD-10-CM | POA: Diagnosis not present

## 2023-09-26 DIAGNOSIS — D509 Iron deficiency anemia, unspecified: Secondary | ICD-10-CM

## 2023-09-26 DIAGNOSIS — C342 Malignant neoplasm of middle lobe, bronchus or lung: Secondary | ICD-10-CM | POA: Diagnosis not present

## 2023-09-26 LAB — CMP (CANCER CENTER ONLY)
ALT: <5 U/L (ref 0–44)
AST: 8 U/L — ABNORMAL LOW (ref 15–41)
Albumin: 3.4 g/dL — ABNORMAL LOW (ref 3.5–5.0)
Alkaline Phosphatase: 131 U/L — ABNORMAL HIGH (ref 38–126)
Anion gap: 9 (ref 5–15)
BUN: 10 mg/dL (ref 8–23)
CO2: 22 mmol/L (ref 22–32)
Calcium: 8.7 mg/dL — ABNORMAL LOW (ref 8.9–10.3)
Chloride: 106 mmol/L (ref 98–111)
Creatinine: 0.67 mg/dL (ref 0.44–1.00)
GFR, Estimated: >60 mL/min (ref >60)
Glucose, Bld: 73 mg/dL (ref 70–99)
Potassium: 4.6 mmol/L (ref 3.5–5.1)
Sodium: 137 mmol/L (ref 135–145)
Total Bilirubin: 0.1 mg/dL (ref <1.2)
Total Protein: 6.2 g/dL — ABNORMAL LOW (ref 6.5–8.1)

## 2023-09-26 LAB — RAD ONC ARIA SESSION SUMMARY
Course Elapsed Days: 49
Plan Fractions Treated to Date: 1
Plan Prescribed Dose Per Fraction: 2 Gy
Plan Total Fractions Prescribed: 3
Plan Total Prescribed Dose: 6 Gy
Reference Point Dosage Given to Date: 2 Gy
Reference Point Session Dosage Given: 2 Gy
Session Number: 31

## 2023-09-26 LAB — CBC WITH DIFFERENTIAL (CANCER CENTER ONLY)
Abs Immature Granulocytes: 0.18 10*3/uL — ABNORMAL HIGH (ref 0.00–0.07)
Basophils Absolute: 0 10*3/uL (ref 0.0–0.1)
Basophils Relative: 0 %
Eosinophils Absolute: 0 10*3/uL (ref 0.0–0.5)
Eosinophils Relative: 1 %
HCT: 28 % — ABNORMAL LOW (ref 36.0–46.0)
Hemoglobin: 9.2 g/dL — ABNORMAL LOW (ref 12.0–15.0)
Immature Granulocytes: 4 %
Lymphocytes Relative: 12 %
Lymphs Abs: 0.5 10*3/uL — ABNORMAL LOW (ref 0.7–4.0)
MCH: 33.8 pg (ref 26.0–34.0)
MCHC: 32.9 g/dL (ref 30.0–36.0)
MCV: 102.9 fL — ABNORMAL HIGH (ref 80.0–100.0)
Monocytes Absolute: 0.7 10*3/uL (ref 0.1–1.0)
Monocytes Relative: 16 %
Neutro Abs: 3 10*3/uL (ref 1.7–7.7)
Neutrophils Relative %: 67 %
Platelet Count: 73 10*3/uL — ABNORMAL LOW (ref 150–400)
RBC: 2.72 MIL/uL — ABNORMAL LOW (ref 3.87–5.11)
RDW: 18.8 % — ABNORMAL HIGH (ref 11.5–15.5)
WBC Count: 4.5 10*3/uL (ref 4.0–10.5)
nRBC: 0.4 % — ABNORMAL HIGH (ref 0.0–0.2)

## 2023-09-26 LAB — ABO/RH: ABO/RH(D): B POS

## 2023-09-26 LAB — SAMPLE TO BLOOD BANK

## 2023-09-26 LAB — MAGNESIUM: Magnesium: 1.7 mg/dL (ref 1.7–2.4)

## 2023-09-26 MED ORDER — NYSTATIN 100000 UNIT/ML MT SUSP: 5.0000 mL | Freq: Four times a day (QID) | OROMUCOSAL | 0 refills | Status: DC

## 2023-09-26 MED FILL — Fosaprepitant Dimeglumine For IV Infusion 150 MG (Base Eq): INTRAVENOUS | Qty: 5 | Status: AC

## 2023-09-27 ENCOUNTER — Inpatient Hospital Stay: Payer: Medicare Other | Admitting: Dietician

## 2023-09-27 ENCOUNTER — Inpatient Hospital Stay: Payer: Medicare Other

## 2023-09-27 ENCOUNTER — Other Ambulatory Visit: Payer: Self-pay

## 2023-09-27 ENCOUNTER — Other Ambulatory Visit: Payer: Medicare Other

## 2023-09-27 ENCOUNTER — Ambulatory Visit: Payer: Medicare Other

## 2023-09-27 ENCOUNTER — Ambulatory Visit
Admission: RE | Admit: 2023-09-27 | Discharge: 2023-09-27 | Disposition: A | Discharge status: Home / Self Care | Payer: Medicare Other | Source: Ambulatory Visit | Attending: Radiation Oncology | Admitting: Radiation Oncology

## 2023-09-27 DIAGNOSIS — C3401 Malignant neoplasm of right main bronchus: Secondary | ICD-10-CM | POA: Diagnosis not present

## 2023-09-27 DIAGNOSIS — Z87891 Personal history of nicotine dependence: Secondary | ICD-10-CM | POA: Diagnosis not present

## 2023-09-27 DIAGNOSIS — Z510 Encounter for antineoplastic radiation therapy: Secondary | ICD-10-CM | POA: Diagnosis not present

## 2023-09-27 LAB — RAD ONC ARIA SESSION SUMMARY
Course Elapsed Days: 50
Plan Fractions Treated to Date: 2
Plan Prescribed Dose Per Fraction: 2 Gy
Plan Total Fractions Prescribed: 3
Plan Total Prescribed Dose: 6 Gy
Reference Point Dosage Given to Date: 4 Gy
Reference Point Session Dosage Given: 2 Gy
Session Number: 32

## 2023-09-28 ENCOUNTER — Ambulatory Visit
Admission: RE | Admit: 2023-09-28 | Discharge: 2023-09-28 | Disposition: A | Discharge status: Home / Self Care | Payer: Medicare Other | Source: Ambulatory Visit | Attending: Radiation Oncology | Admitting: Radiation Oncology

## 2023-09-28 ENCOUNTER — Other Ambulatory Visit: Payer: Self-pay

## 2023-09-28 ENCOUNTER — Inpatient Hospital Stay: Payer: Medicare Other

## 2023-09-28 DIAGNOSIS — Z510 Encounter for antineoplastic radiation therapy: Secondary | ICD-10-CM | POA: Diagnosis not present

## 2023-09-28 DIAGNOSIS — Z87891 Personal history of nicotine dependence: Secondary | ICD-10-CM | POA: Diagnosis not present

## 2023-09-28 DIAGNOSIS — C3401 Malignant neoplasm of right main bronchus: Secondary | ICD-10-CM | POA: Diagnosis not present

## 2023-09-28 DIAGNOSIS — C342 Malignant neoplasm of middle lobe, bronchus or lung: Secondary | ICD-10-CM | POA: Diagnosis not present

## 2023-09-28 LAB — RAD ONC ARIA SESSION SUMMARY
Course Elapsed Days: 51
Plan Fractions Treated to Date: 3
Plan Prescribed Dose Per Fraction: 2 Gy
Plan Total Fractions Prescribed: 3
Plan Total Prescribed Dose: 6 Gy
Reference Point Dosage Given to Date: 6 Gy
Reference Point Session Dosage Given: 2 Gy
Session Number: 33

## 2023-09-29 ENCOUNTER — Inpatient Hospital Stay: Payer: Medicare Other

## 2023-09-29 NOTE — Radiation Completion Notes (Addendum)
  Radiation Oncology         [phone] ________________________________  Name: [name] MRN: [id number]  Date of Service: 09/28/2023  DOB: 09/02/1956  End of Treatment Note    Diagnosis:   Limited Stage Small Cell Carcinoma of the right hilar lung.   Intent: Curative     ==========DELIVERED PLANS==========  First Treatment Date: 2023-08-08 Last Treatment Date: 2023-09-28   Plan Name: Lung_R Site: Lung, Right Technique: 3D Mode: Photon Dose Per Fraction: 2 Gy Prescribed Dose (Delivered / Prescribed): 60 Gy / 60 Gy Prescribed Fxs (Delivered / Prescribed): 30 / 30   Plan Name: Lung_R_Bst Site: Lung, Right Technique: 3D Mode: Photon Dose Per Fraction: 2 Gy Prescribed Dose (Delivered / Prescribed): 6 Gy / 6 Gy Prescribed Fxs (Delivered / Prescribed): 3 / 3     ==========ON TREATMENT VISIT DATES========== 2023-08-12, 2023-08-19, 2023-08-26, 2023-09-02, 2023-09-09, 2023-09-19, 2023-09-28   See weekly On Treatment Notes in Epic for details in the Media tab (listed as Progress notes on the On Treatment Visit Dates listed above). The patient tolerated radiation. She developed fatigue and anticipated skin changes in the treatment field. She developed esophagitis as well but this was improved with carafate and and nystatin.  The patient will receive a call in about one month from the radiation oncology department. She will continue follow up with Dr. Arbutus Ped and his team as well. We will check in after she completes chemotherapy to review the discussion we began previously on prophylactic cranial irradiation.      Osker Mason, PAC

## 2023-09-30 MED FILL — Fosaprepitant Dimeglumine For IV Infusion 150 MG (Base Eq): INTRAVENOUS | Qty: 5 | Status: AC

## 2023-10-03 ENCOUNTER — Inpatient Hospital Stay: Payer: Medicare Other

## 2023-10-03 ENCOUNTER — Ambulatory Visit (HOSPITAL_BASED_OUTPATIENT_CLINIC_OR_DEPARTMENT_OTHER): Payer: Medicare Other | Admitting: Physician Assistant

## 2023-10-03 VITALS — BP 109/54 | HR 79 | Temp 98.5°F | Resp 18

## 2023-10-03 VITALS — BP 109/54 | HR 79 | Temp 98.5°F | Resp 18 | Wt 83.2 lb

## 2023-10-03 DIAGNOSIS — R051 Acute cough: Secondary | ICD-10-CM

## 2023-10-03 DIAGNOSIS — C3401 Malignant neoplasm of right main bronchus: Secondary | ICD-10-CM

## 2023-10-03 DIAGNOSIS — Z5111 Encounter for antineoplastic chemotherapy: Secondary | ICD-10-CM | POA: Diagnosis not present

## 2023-10-03 DIAGNOSIS — D509 Iron deficiency anemia, unspecified: Secondary | ICD-10-CM

## 2023-10-03 LAB — CBC WITH DIFFERENTIAL (CANCER CENTER ONLY)
Abs Immature Granulocytes: 0.03 10*3/uL (ref 0.00–0.07)
Basophils Absolute: 0 10*3/uL (ref 0.0–0.1)
Basophils Relative: 1 %
Eosinophils Absolute: 0 10*3/uL (ref 0.0–0.5)
Eosinophils Relative: 0 %
HCT: 30 % — ABNORMAL LOW (ref 36.0–46.0)
Hemoglobin: 9.9 g/dL — ABNORMAL LOW (ref 12.0–15.0)
Immature Granulocytes: 1 %
Lymphocytes Relative: 13 %
Lymphs Abs: 0.5 10*3/uL — ABNORMAL LOW (ref 0.7–4.0)
MCH: 34.3 pg — ABNORMAL HIGH (ref 26.0–34.0)
MCHC: 33 g/dL (ref 30.0–36.0)
MCV: 103.8 fL — ABNORMAL HIGH (ref 80.0–100.0)
Monocytes Absolute: 0.5 10*3/uL (ref 0.1–1.0)
Monocytes Relative: 14 %
Neutro Abs: 2.6 10*3/uL (ref 1.7–7.7)
Neutrophils Relative %: 71 %
Platelet Count: 104 10*3/uL — ABNORMAL LOW (ref 150–400)
RBC: 2.89 MIL/uL — ABNORMAL LOW (ref 3.87–5.11)
RDW: 19.3 % — ABNORMAL HIGH (ref 11.5–15.5)
WBC Count: 3.7 10*3/uL — ABNORMAL LOW (ref 4.0–10.5)
nRBC: 0 % (ref 0.0–0.2)

## 2023-10-03 LAB — CMP (CANCER CENTER ONLY)
ALT: <5 U/L (ref 0–44)
AST: 10 U/L — ABNORMAL LOW (ref 15–41)
Albumin: 3.3 g/dL — ABNORMAL LOW (ref 3.5–5.0)
Alkaline Phosphatase: 96 U/L (ref 38–126)
Anion gap: 7 (ref 5–15)
BUN: 6 mg/dL — ABNORMAL LOW (ref 8–23)
CO2: 26 mmol/L (ref 22–32)
Calcium: 8.8 mg/dL — ABNORMAL LOW (ref 8.9–10.3)
Chloride: 106 mmol/L (ref 98–111)
Creatinine: 0.52 mg/dL (ref 0.44–1.00)
GFR, Estimated: >60 mL/min (ref >60)
Glucose, Bld: 88 mg/dL (ref 70–99)
Potassium: 3.9 mmol/L (ref 3.5–5.1)
Sodium: 139 mmol/L (ref 135–145)
Total Bilirubin: 0.3 mg/dL (ref <1.2)
Total Protein: 5.8 g/dL — ABNORMAL LOW (ref 6.5–8.1)

## 2023-10-03 LAB — SAMPLE TO BLOOD BANK

## 2023-10-03 LAB — MAGNESIUM: Magnesium: 1.8 mg/dL (ref 1.7–2.4)

## 2023-10-03 MED ORDER — SODIUM CHLORIDE 0.9% FLUSH
10.0000 mL | INTRAVENOUS | Status: DC | PRN
Start: 2023-10-03 — End: 2023-10-03

## 2023-10-03 MED ORDER — SODIUM CHLORIDE 0.9 % IV SOLN
60.0000 mg/m2 | Freq: Once | INTRAVENOUS | Status: AC
  Administered 2023-10-03: 79 mg via INTRAVENOUS
  Filled 2023-10-03: qty 79

## 2023-10-03 MED ORDER — POTASSIUM CHLORIDE IN NACL 20-0.9 MEQ/L-% IV SOLN
Freq: Once | INTRAVENOUS | Status: AC
  Filled 2023-10-03: qty 1000

## 2023-10-03 MED ORDER — PALONOSETRON HCL INJECTION 0.25 MG/5ML
0.2500 mg | Freq: Once | INTRAVENOUS | Status: AC
  Administered 2023-10-03: 0.25 mg via INTRAVENOUS
  Filled 2023-10-03: qty 5

## 2023-10-03 MED ORDER — HEPARIN SOD (PORK) LOCK FLUSH 100 UNIT/ML IV SOLN
500.0000 [IU] | Freq: Once | INTRAVENOUS | Status: DC | PRN
Start: 2023-10-03 — End: 2023-10-03

## 2023-10-03 MED ORDER — SODIUM CHLORIDE 0.9 % IV SOLN: INTRAVENOUS | Status: DC

## 2023-10-03 MED ORDER — SODIUM CHLORIDE 0.9 % IV SOLN
150.0000 mg | Freq: Once | INTRAVENOUS | Status: AC
  Administered 2023-10-03: 150 mg via INTRAVENOUS
  Filled 2023-10-03: qty 150
  Filled 2023-10-03: qty 5

## 2023-10-03 MED ORDER — AZITHROMYCIN 250 MG PO TABS: ORAL_TABLET | ORAL | 0 refills | Status: AC

## 2023-10-03 MED ORDER — DEXAMETHASONE SODIUM PHOSPHATE 10 MG/ML IJ SOLN
10.0000 mg | Freq: Once | INTRAMUSCULAR | Status: AC
Start: 2023-10-03 — End: 2023-10-03
  Administered 2023-10-03: 10 mg via INTRAVENOUS
  Filled 2023-10-03: qty 1

## 2023-10-03 MED ORDER — MAGNESIUM SULFATE 2 GM/50ML IV SOLN
2.0000 g | Freq: Once | INTRAVENOUS | Status: AC
  Administered 2023-10-03: 2 g via INTRAVENOUS
  Filled 2023-10-03: qty 50

## 2023-10-03 MED ORDER — SODIUM CHLORIDE 0.9 % IV SOLN
90.0000 mg/m2 | Freq: Once | INTRAVENOUS | Status: AC
  Administered 2023-10-03: 118 mg via INTRAVENOUS
  Filled 2023-10-03: qty 5.9

## 2023-10-03 NOTE — Progress Notes (Signed)
Per Dr. Arbutus Ped it is okay to run NS with electrolytes with Cisplatin.

## 2023-10-03 NOTE — Patient Instructions (Signed)
 [facility] - A DEPT OF [facility]  Discharge Instructions: Thank you for choosing [facility] to provide your oncology and hematology care.   If you have a lab appointment with the Cancer Center, please go directly to the Cancer Center and check in at the registration area.   Wear comfortable clothing and clothing appropriate for easy access to any Portacath or PICC line.   We strive to give you quality time with your provider. You may need to reschedule your appointment if you arrive late (15 or more minutes).  Arriving late affects you and other patients whose appointments are after yours.  Also, if you miss three or more appointments without notifying the office, you may be dismissed from the clinic at the provider's discretion.      For prescription refill requests, have your pharmacy contact our office and allow 72 hours for refills to be completed.    Today you received the following chemotherapy and/or immunotherapy agents :  Cisplatin & Etoposide      To help prevent nausea and vomiting after your treatment, we encourage you to take your nausea medication as directed.  BELOW ARE SYMPTOMS THAT SHOULD BE REPORTED IMMEDIATELY: *FEVER GREATER THAN 100.4 F (38 C) OR HIGHER *CHILLS OR SWEATING *NAUSEA AND VOMITING THAT IS NOT CONTROLLED WITH YOUR NAUSEA MEDICATION *UNUSUAL SHORTNESS OF BREATH *UNUSUAL BRUISING OR BLEEDING *URINARY PROBLEMS (pain or burning when urinating, or frequent urination) *BOWEL PROBLEMS (unusual diarrhea, constipation, pain near the anus) TENDERNESS IN MOUTH AND THROAT WITH OR WITHOUT PRESENCE OF ULCERS (sore throat, sores in mouth, or a toothache) UNUSUAL RASH, SWELLING OR PAIN  UNUSUAL VAGINAL DISCHARGE OR ITCHING   Items with * indicate a potential emergency and should be followed up as soon as possible or go to the Emergency Department if any problems should occur.  Please show the CHEMOTHERAPY ALERT CARD or  IMMUNOTHERAPY ALERT CARD at check-in to the Emergency Department and triage nurse.  Should you have questions after your visit or need to cancel or reschedule your appointment, please contact [facility] - [facility]  Dept: [phone]  and follow the prompts.  Office hours are 8:00 a.m. to 4:30 p.m. Monday - Friday. Please note that voicemails left after 4:00 p.m. may not be returned until the following business day.  We are closed weekends and major holidays. You have access to a nurse at all times for urgent questions. Please call the main number to the clinic Dept: [phone] and follow the prompts.   For any non-urgent questions, you may also contact your provider using MyChart. We now offer e-Visits for anyone 71 and older to request care online for non-urgent symptoms. For details visit [email or web address].   Also download the MyChart app! Go to the app store, search "MyChart", open the app, select , and log in with your MyChart username and password.

## 2023-10-03 NOTE — Progress Notes (Signed)
Symptom Management Consult Note [facility]    Patient Care Team: [name], MD as PCP - General (Internal Medicine) Hilarie Fredrickson, MD as Consulting Physician (Gastroenterology) Miguel Aschoff, MD (Inactive) as Consulting Physician (Obstetrics and Gynecology) Lytle Butte, RN as Oncology Nurse Navigator    Name / MRN / [id number] Dougherty  [id number]  12/07/1955   Date of visit: 10/03/2023   Chief Complaint/Reason for visit: cough   Current Therapy: cisplatin and etopside   Last treatment:  Day 3   Cycle 2 on 09/08/23   ASSESSMENT & PLAN: Patient is a 67 y.o. female with oncologic history of Limited stage small cell lung cancer (T1c, N2, M0) followed by Dr. Arbutus Ped.  I have viewed most recent oncology note and lab work.    #Limited stage small cell lung cancer (T1c, N2, M0)  - Here for treatment today - Next appointment with oncologist is 10/23/22   #Cough -Clear lung exam.  Normal work of breathing.  No tachycardia or hypoxia. -Reviewed labs from earlier today.  CBC showing leukopenia 3.7. -Discussed with oncologist who is agreeable with plan for antibiotic treatment as she is immunocompromised.  Z-Pak prescribed.  Will hold off on chest x-ray at this time given reassuring lung exam.  If symptoms do not improve or new ones develop would consider having her return to clinic for evaluation and chest x-ray.   Strict ED precautions discussed should symptoms worsen.   Heme/Onc History: Oncology History  Small cell lung cancer (HCC)  08/01/2023 Initial Diagnosis   Small cell lung cancer (HCC)   08/08/2023 -  Chemotherapy   Patient is on Treatment Plan : LUNG SMALL CELL Cisplatin (80) D1 + Etoposide (100) D1-3 q21d         Interval history-: Discussed the use of AI scribe software for clinical note transcription with the patient, who gave verbal consent to proceed.   [name] is a 67 y.o. female with oncologic history as above  presenting to [facility] today with chief complaint of cough. Patient is accompanied by family member who provides additional history.  The patient began experiencing a productive cough x 2 days. The cough is described as congested and wet sounding, with a bad taste. Despite self-administration of Robitussin and gargling, the cough persists and is unresponsive to these interventions. The patient has a history of developing bronchitis with weather changes and has been using DuoNeb treatments three times a day, along with a rescue inhaler. The inhaler provides some relief, opening the airways and improving breathing. The patient reports occasional wheezing, particularly during bouts of intense coughing, which is typical during her bronchitis episodes. The last occurrence of bronchitis was approximately a year ago.  The patient denies any recent exposure to sick individuals and has been staying home. The current cough is described as more severe than a previous cough mentioned during a prior consultation. The patient denies nasal congestion or sore throat. In past bronchitis episodes, antibiotics were typically prescribed for management. The patient was on antibiotics about a month ago for an abscess on the hand, which has since healed after incision and drainage. The patient completed a course of Keflex for this issue. The patient denies current use of steroids but has been prescribed them during past bronchitis episodes. The patient reports normal eating and drinking habits, though notes discomfort in the chest during swallowing. The patient's previous issue with oral thrush has resolved.   ROS  All other systems  are reviewed and are negative for acute change except as noted in the HPI.    Allergies  Allergen Reactions   Decadron [Dexamethasone] Other (See Comments)    Stomach issues, history of GI bleed   Celebrex [Celecoxib] Other (See Comments)    GI bleed   Crestor [Rosuvastatin] Other (See Comments)     Made legs hurt   Doxycycline Other (See Comments)    Stomach upset    Penicillins Other (See Comments)    Has patient had a PCN reaction causing immediate rash, facial/tongue/throat swelling, SOB or lightheadedness with hypotension: Yes Has patient had a PCN reaction causing severe rash involving mucus membranes or skin necrosis: No Has patient had a PCN reaction that required hospitalization: No Has patient had a PCN reaction occurring within the last 10 years: Yes If all of the above answers are "NO", then may proceed with Cephalosporin use.   REACTION: whelps   Pravastatin Other (See Comments)    Muscle aches   Hydrocodone-Acetaminophen Hives, Itching and Rash     Past Medical History:  Diagnosis Date   Allergy    Anemia    Arthritis    Asthma    Blood transfusion without reported diagnosis    Cataract    COPD (chronic obstructive pulmonary disease) (HCC)    Duodenal ulcer    Emphysema of lung (HCC)    Family history of malignant neoplasm of gastrointestinal tract    GERD (gastroesophageal reflux disease)    Hepatitis    had in 6th grade pt unsure which kind   Hiatal hernia    HTN (hypertension) 10/03/2014   Hx of colonic polyps    Hyperlipemia    Internal hemorrhoids    Macular degeneration    Osteoporosis    Peripheral vascular disease (HCC)    Pneumonia    hx of   Shortness of breath dyspnea    with ambulation   Stroke (HCC)    Vitamin D deficiency      Past Surgical History:  Procedure Laterality Date   ABDOMINAL HYSTERECTOMY     APPENDECTOMY     CATARACT EXTRACTION, BILATERAL     COLONOSCOPY     ELBOW SURGERY Right    ENDARTERECTOMY Right 12/16/2014   Procedure: ENDARTERECTOMY CAROTID with Dacron patch;  Surgeon: Larina Earthly, MD;  Location: [facility] OR;  Service: Vascular;  Laterality: Right;   ESOPHAGOGASTRODUODENOSCOPY  10/2008   FINE NEEDLE ASPIRATION BIOPSY  07/25/2023   Procedure: FINE NEEDLE ASPIRATION BIOPSY;  Surgeon: Leslye Peer, MD;   Location: [facility] ENDOSCOPY;  Service: Pulmonary;;   HAND SURGERY Left    LUMBAR DISC SURGERY     TOTAL HIP ARTHROPLASTY Right    trigeminal neuralgia surgery     UPPER GASTROINTESTINAL ENDOSCOPY     VIDEO BRONCHOSCOPY WITH ENDOBRONCHIAL ULTRASOUND N/A 07/25/2023   Procedure: VIDEO BRONCHOSCOPY WITH ENDOBRONCHIAL ULTRASOUND;  Surgeon: Leslye Peer, MD;  Location: [facility] ENDOSCOPY;  Service: Pulmonary;  Laterality: N/A;   WRIST SURGERY Left     Social History   Socioeconomic History   Marital status: Widowed    Spouse name: Not on file   Number of children: 1   Years of education: Not on file   Highest education level: Not on file  Occupational History   Occupation: disability  Tobacco Use   Smoking status: Former    Current packs/day: 0.00    Average packs/day: 0.3 packs/day for 36.0 years (9.0 ttl pk-yrs)    Types: Cigarettes  Start date: 09/14/1981    Quit date: 09/14/2017    Years since quitting: 6.0   Smokeless tobacco: Never   Tobacco comments:    1-2 sometimes not daily  Vaping Use   Vaping status: Never Used  Substance and Sexual Activity   Alcohol use: Not Currently    Alcohol/week: 0.0 standard drinks of alcohol   Drug use: No   Sexual activity: Not Currently  Other Topics Concern   Not on file  Social History Narrative   Not on file   Social Drivers of Health   Financial Resource Strain: Unknown (09/18/2017)   Overall Financial Resource Strain (CARDIA)    Difficulty of Paying Living Expenses: Patient declined  Food Insecurity: Unknown (09/18/2017)   Hunger Vital Sign    Worried About Running Out of Food in the Last Year: Patient declined    Ran Out of Food in the Last Year: Patient declined  Transportation Needs: Unknown (09/18/2017)   PRAPARE - Administrator, Civil Service (Medical): Patient declined    Lack of Transportation (Non-Medical): Patient declined  Physical Activity: Unknown (09/18/2017)   Exercise Vital Sign    Days of Exercise per  Week: Patient declined    Minutes of Exercise per Session: Patient declined  Stress: Stress Concern Present (09/18/2017)   [employer] of Occupational Health - Occupational Stress Questionnaire    Feeling of Stress : To some extent  Social Connections: Unknown (09/18/2017)   Social Connection and Isolation Panel [NHANES]    Frequency of Communication with Friends and Family: Patient declined    Frequency of Social Gatherings with Friends and Family: Patient declined    Attends Religious Services: Patient declined    Database administrator or Organizations: Patient declined    Attends Banker Meetings: Patient declined    Marital Status: Patient declined  Intimate Partner Violence: Unknown (09/18/2017)   Humiliation, Afraid, Rape, and Kick questionnaire    Fear of Current or Ex-Partner: Patient declined    Emotionally Abused: Patient declined    Physically Abused: Patient declined    Sexually Abused: Patient declined    Family History  Problem Relation Age of Onset   Hypertension Father    Hyperlipidemia Father    Heart disease Father    Heart attack Father    Colon polyps Mother    Inflammatory bowel disease Mother    Hypertension Mother    Stroke Mother    Heart disease Mother    Hyperlipidemia Mother    Varicose Veins Mother    Heart attack Mother    AAA (abdominal aortic aneurysm) Mother    Diabetes Maternal Grandfather    Heart disease Maternal Grandfather    Heart disease Maternal Grandmother    Colon cancer Other        mat great aunt   Heart disease Brother    Hyperlipidemia Brother    Hypertension Brother    Heart attack Brother    AAA (abdominal aortic aneurysm) Brother    Varicose Veins Daughter    Esophageal cancer Neg Hx    Stomach cancer Neg Hx    Rectal cancer Neg Hx      Current Outpatient Medications:    azithromycin (ZITHROMAX) 250 MG tablet, Take 2 tablets (500 mg total) by mouth daily for 1 day, THEN 1 tablet (250 mg total)  daily for 4 days., Disp: 6 tablet, Rfl: 0   albuterol (PROVENTIL) (2.5 MG/3ML) 0.083% nebulizer solution, Take 3 mLs (2.5 mg total)  by nebulization every 6 (six) hours as needed for wheezing or shortness of breath., Disp: 75 mL, Rfl: 12   albuterol (VENTOLIN HFA) 108 (90 Base) MCG/ACT inhaler, 2 Inhalations 15 to 20  minutes apart every 4 hours to Rescue Asthma, Disp: 18 g, Rfl: 3   Ascorbic Acid (VITAMIN C) 1000 MG tablet, Take 1,000 mg by mouth daily. Takes 7,000mg , Disp: , Rfl:    aspirin EC 81 MG tablet, Take 1 tablet (81 mg total) by mouth daily., Disp: 30 tablet, Rfl: 0   Cholecalciferol (VITAMIN D) 50 MCG (2000 UT) CAPS, Take by mouth. Takes 6,000 units, Disp: , Rfl:    ezetimibe (ZETIA) 10 MG tablet, Take 1 tablet (10 mg total) by mouth daily., Disp: 30 tablet, Rfl: 11   FeFum-FePoly-FA-B Cmp-C-Biot (INTEGRA PLUS) CAPS, Take 1 capsule by mouth every morning., Disp: 30 capsule, Rfl: 2   fluticasone-salmeterol (ADVAIR DISKUS) 250-50 MCG/ACT AEPB, Inhale 1 puff into the lungs in the morning and at bedtime., Disp: 60 each, Rfl: 1   ipratropium-albuterol (DUONEB) 0.5-2.5 (3) MG/3ML SOLN, Take 3 mLs by nebulization every 4 (four) hours as needed. Max:6 doses per day, Disp: 540 mL, Rfl: 0   iron polysaccharides (NIFEREX) 150 MG capsule, Take 1 capsule (150 mg total) by mouth daily., Disp: 30 capsule, Rfl: 2   IRON, FERROUS SULFATE, PO, Take 65 mg by mouth 2 (two) times daily., Disp: , Rfl:    magic mouthwash (nystatin, diphenhydrAMINE, alum & mag hydroxide) suspension mixture, Take 5 mLs by mouth 3 (three) times daily as needed for mouth pain., Disp: 240 mL, Rfl: 0   magnesium oxide (MAG-OX) 400 (240 Mg) MG tablet, Take 1 tablet (400 mg total) by mouth daily., Disp: 30 tablet, Rfl: 1   nystatin (MYCOSTATIN) 100000 UNIT/ML suspension, Take 5 mLs (500,000 Units total) by mouth 4 (four) times daily., Disp: 60 mL, Rfl: 0   omeprazole (PRILOSEC) 20 MG capsule, TAKE 1 CAPSULE BY MOUTH TWICE DAILY TO  PREVENT INDIGESTION & HEARTBURN, Disp: 180 capsule, Rfl: 1   ondansetron (ZOFRAN) 8 MG tablet, Take 1 tablet (8 mg total) by mouth every 8 (eight) hours as needed for nausea or vomiting. Starting 3 days after chemotherapy, Disp: 30 tablet, Rfl: 1   prochlorperazine (COMPAZINE) 10 MG tablet, Take 1 tablet (10 mg total) by mouth every 6 (six) hours as needed., Disp: 30 tablet, Rfl: 2   sucralfate (CARAFATE) 1 g tablet, Take 1 tablet (1 g total) by mouth 4 (four) times daily. Dissolve each tablet in 15 cc water before use., Disp: 120 tablet, Rfl: 2   zinc gluconate 50 MG tablet, Take 50 mg by mouth daily., Disp: , Rfl:  No current facility-administered medications for this visit.  Facility-Administered Medications Ordered in Other Visits:    0.9 %  sodium chloride infusion, , Intravenous, Continuous, Si Gaul, MD, Last Rate: 10 mL/hr at 10/03/23 0956, New Bag at 10/03/23 0956   magnesium sulfate IVPB 2 g 50 mL, 2 g, Intravenous, Once, Si Gaul, MD, Last Rate: 50 mL/hr at 10/03/23 1007, 2 g at 10/03/23 1007  PHYSICAL EXAM: ECOG FS:1 - Symptomatic but completely ambulatory    Vitals:   10/03/23 0904  BP: (!) 109/54  Pulse: 79  Resp: 18  Temp: 98.5 F (36.9 C)  TempSrc: Oral  SpO2: 94%   Physical Exam Vitals and nursing note reviewed.  Constitutional:      Appearance: She is not ill-appearing or toxic-appearing.     Comments: Thin appearing female  HENT:     Head: Normocephalic.  Eyes:     Conjunctiva/sclera: Conjunctivae normal.  Cardiovascular:     Rate and Rhythm: Normal rate and regular rhythm.     Pulses: Normal pulses.     Heart sounds: Normal heart sounds.  Pulmonary:     Effort: Pulmonary effort is normal. No respiratory distress.     Breath sounds: Normal breath sounds. No stridor. No wheezing, rhonchi or rales.  Chest:     Chest wall: No tenderness.  Abdominal:     General: There is no distension.  Musculoskeletal:     Cervical back: Normal range of  motion.  Skin:    General: Skin is warm and dry.  Neurological:     Mental Status: She is alert.        LABORATORY DATA: I have reviewed the data as listed    Latest Ref Rng & Units 10/03/2023    7:52 AM 09/26/2023    1:41 PM 09/23/2023   10:32 AM  CBC  WBC 4.0 - 10.5 K/uL 3.7  4.5  9.3   Hemoglobin 12.0 - 15.0 g/dL 9.9  9.2  9.4   Hematocrit 36.0 - 46.0 % 30.0  28.0  28.1   Platelets 150 - 400 K/uL 104  73  63         Latest Ref Rng & Units 10/03/2023    7:52 AM 09/26/2023    1:41 PM 09/21/2023    9:42 AM  CMP  Glucose 70 - 99 mg/dL 88  73  93   BUN 8 - 23 mg/dL 6  10  12    Creatinine 0.44 - 1.00 mg/dL 1.61  0.96  0.45   Sodium 135 - 145 mmol/L 139  137  139   Potassium 3.5 - 5.1 mmol/L 3.9  4.6  4.3   Chloride 98 - 111 mmol/L 106  106  107   CO2 22 - 32 mmol/L 26  22  25    Calcium 8.9 - 10.3 mg/dL 8.8  8.7  9.0   Total Protein 6.5 - 8.1 g/dL 5.8  6.2  6.2   Total Bilirubin <1.2 mg/dL 0.3  0.1  0.2   Alkaline Phos 38 - 126 U/L 96  131  95   AST 15 - 41 U/L 10  8  6    ALT 0 - 44 U/L <5  <5  5        RADIOGRAPHIC STUDIES (from last 24 hours if applicable) I have personally reviewed the radiological images as listed and agreed with the findings in the report. No results found.      Visit Diagnosis: 1. Small cell carcinoma of hilum of right lung (HCC)   2. Acute cough      No orders of the defined types were placed in this encounter.   All questions were answered. The patient knows to call the clinic with any problems, questions or concerns. No barriers to learning was detected.  A total of more than 30 minutes were spent on this encounter with face-to-face time and non-face-to-face time, including preparing to see the patient, ordering tests and/or medications, counseling the patient and coordination of care as outlined above.    Thank you for allowing me to participate in the care of this patient.    Shanon Ace, PA-C Department of  Hematology/Oncology [facility] Phone: [phone]  [phone]    10/03/2023 10:51 AM

## 2023-10-04 ENCOUNTER — Inpatient Hospital Stay: Payer: Medicare Other

## 2023-10-04 ENCOUNTER — Ambulatory Visit: Payer: Medicare Other

## 2023-10-04 ENCOUNTER — Other Ambulatory Visit: Payer: Medicare Other

## 2023-10-04 VITALS — BP 132/64 | HR 88 | Temp 98.3°F | Resp 18

## 2023-10-04 DIAGNOSIS — Z5111 Encounter for antineoplastic chemotherapy: Secondary | ICD-10-CM | POA: Diagnosis not present

## 2023-10-04 DIAGNOSIS — C3401 Malignant neoplasm of right main bronchus: Secondary | ICD-10-CM

## 2023-10-04 MED ORDER — DEXAMETHASONE SODIUM PHOSPHATE 10 MG/ML IJ SOLN
10.0000 mg | Freq: Once | INTRAMUSCULAR | Status: AC
  Administered 2023-10-04: 10 mg via INTRAVENOUS
  Filled 2023-10-04: qty 1

## 2023-10-04 MED ORDER — SODIUM CHLORIDE 0.9 % IV SOLN
INTRAVENOUS | Status: DC
Start: 2023-10-04 — End: 2023-10-04

## 2023-10-04 MED ORDER — ETOPOSIDE CHEMO INJECTION 1 GM/50ML
90.0000 mg/m2 | Freq: Once | INTRAVENOUS | Status: AC
  Administered 2023-10-04: 118 mg via INTRAVENOUS
  Filled 2023-10-04: qty 5.9

## 2023-10-04 MED ORDER — SODIUM CHLORIDE 0.9% FLUSH
10.0000 mL | INTRAVENOUS | Status: DC | PRN
Start: 2023-10-04 — End: 2023-10-04

## 2023-10-04 MED ORDER — HEPARIN SOD (PORK) LOCK FLUSH 100 UNIT/ML IV SOLN: 500.0000 [IU] | Freq: Once | INTRAVENOUS | Status: DC | PRN

## 2023-10-04 NOTE — Patient Instructions (Signed)
 [facility] - A DEPT OF [facility]  Discharge Instructions: Thank you for choosing [facility] to provide your oncology and hematology care.   If you have a lab appointment with the Cancer Center, please go directly to the Cancer Center and check in at the registration area.   Wear comfortable clothing and clothing appropriate for easy access to any Portacath or PICC line.   We strive to give you quality time with your provider. You may need to reschedule your appointment if you arrive late (15 or more minutes).  Arriving late affects you and other patients whose appointments are after yours.  Also, if you miss three or more appointments without notifying the office, you may be dismissed from the clinic at the provider's discretion.      For prescription refill requests, have your pharmacy contact our office and allow 72 hours for refills to be completed.    Today you received the following chemotherapy and/or immunotherapy agents: Etoposide      To help prevent nausea and vomiting after your treatment, we encourage you to take your nausea medication as directed.  BELOW ARE SYMPTOMS THAT SHOULD BE REPORTED IMMEDIATELY: *FEVER GREATER THAN 100.4 F (38 C) OR HIGHER *CHILLS OR SWEATING *NAUSEA AND VOMITING THAT IS NOT CONTROLLED WITH YOUR NAUSEA MEDICATION *UNUSUAL SHORTNESS OF BREATH *UNUSUAL BRUISING OR BLEEDING *URINARY PROBLEMS (pain or burning when urinating, or frequent urination) *BOWEL PROBLEMS (unusual diarrhea, constipation, pain near the anus) TENDERNESS IN MOUTH AND THROAT WITH OR WITHOUT PRESENCE OF ULCERS (sore throat, sores in mouth, or a toothache) UNUSUAL RASH, SWELLING OR PAIN  UNUSUAL VAGINAL DISCHARGE OR ITCHING   Items with * indicate a potential emergency and should be followed up as soon as possible or go to the Emergency Department if any problems should occur.  Please show the CHEMOTHERAPY ALERT CARD or IMMUNOTHERAPY  ALERT CARD at check-in to the Emergency Department and triage nurse.  Should you have questions after your visit or need to cancel or reschedule your appointment, please contact [facility] - A DEPT OF [facility]  Dept: [phone]  and follow the prompts.  Office hours are 8:00 a.m. to 4:30 p.m. Monday - Friday. Please note that voicemails left after 4:00 p.m. may not be returned until the following business day.  We are closed weekends and major holidays. You have access to a nurse at all times for urgent questions. Please call the main number to the clinic Dept: [phone] and follow the prompts.   For any non-urgent questions, you may also contact your provider using MyChart. We now offer e-Visits for anyone 37 and older to request care online for non-urgent symptoms. For details visit [email or web address].   Also download the MyChart app! Go to the app store, search "MyChart", open the app, select [place], and log in with your MyChart username and password.

## 2023-10-05 ENCOUNTER — Inpatient Hospital Stay: Payer: Medicare Other

## 2023-10-05 ENCOUNTER — Ambulatory Visit: Payer: Medicare Other

## 2023-10-05 VITALS — BP 125/71 | HR 89 | Temp 98.6°F | Resp 17 | Wt 85.4 lb

## 2023-10-05 DIAGNOSIS — C3401 Malignant neoplasm of right main bronchus: Secondary | ICD-10-CM

## 2023-10-05 DIAGNOSIS — Z5111 Encounter for antineoplastic chemotherapy: Secondary | ICD-10-CM | POA: Diagnosis not present

## 2023-10-05 MED ORDER — DEXAMETHASONE SODIUM PHOSPHATE 10 MG/ML IJ SOLN
10.0000 mg | Freq: Once | INTRAMUSCULAR | Status: AC
Start: 2023-10-05 — End: 2023-10-05
  Administered 2023-10-05: 10 mg via INTRAVENOUS
  Filled 2023-10-05: qty 1

## 2023-10-05 MED ORDER — SODIUM CHLORIDE 0.9 % IV SOLN
90.0000 mg/m2 | Freq: Once | INTRAVENOUS | Status: AC
  Administered 2023-10-05: 118 mg via INTRAVENOUS
  Filled 2023-10-05: qty 5.9

## 2023-10-05 MED ORDER — SODIUM CHLORIDE 0.9 % IV SOLN: INTRAVENOUS | Status: DC

## 2023-10-05 NOTE — Patient Instructions (Signed)
 [facility] - A DEPT OF [facility]  Discharge Instructions: Thank you for choosing [facility] to provide your oncology and hematology care.   If you have a lab appointment with the Cancer Center, please go directly to the Cancer Center and check in at the registration area.   Wear comfortable clothing and clothing appropriate for easy access to any Portacath or PICC line.   We strive to give you quality time with your provider. You may need to reschedule your appointment if you arrive late (15 or more minutes).  Arriving late affects you and other patients whose appointments are after yours.  Also, if you miss three or more appointments without notifying the office, you may be dismissed from the clinic at the provider's discretion.      For prescription refill requests, have your pharmacy contact our office and allow 72 hours for refills to be completed.    Today you received the following chemotherapy and/or immunotherapy agents: Etoposide      To help prevent nausea and vomiting after your treatment, we encourage you to take your nausea medication as directed.  BELOW ARE SYMPTOMS THAT SHOULD BE REPORTED IMMEDIATELY: *FEVER GREATER THAN 100.4 F (38 C) OR HIGHER *CHILLS OR SWEATING *NAUSEA AND VOMITING THAT IS NOT CONTROLLED WITH YOUR NAUSEA MEDICATION *UNUSUAL SHORTNESS OF BREATH *UNUSUAL BRUISING OR BLEEDING *URINARY PROBLEMS (pain or burning when urinating, or frequent urination) *BOWEL PROBLEMS (unusual diarrhea, constipation, pain near the anus) TENDERNESS IN MOUTH AND THROAT WITH OR WITHOUT PRESENCE OF ULCERS (sore throat, sores in mouth, or a toothache) UNUSUAL RASH, SWELLING OR PAIN  UNUSUAL VAGINAL DISCHARGE OR ITCHING   Items with * indicate a potential emergency and should be followed up as soon as possible or go to the Emergency Department if any problems should occur.  Please show the CHEMOTHERAPY ALERT CARD or IMMUNOTHERAPY  ALERT CARD at check-in to the Emergency Department and triage nurse.  Should you have questions after your visit or need to cancel or reschedule your appointment, please contact [facility] - A DEPT OF [facility]  Dept: [phone]  and follow the prompts.  Office hours are 8:00 a.m. to 4:30 p.m. Monday - Friday. Please note that voicemails left after 4:00 p.m. may not be returned until the following business day.  We are closed weekends and major holidays. You have access to a nurse at all times for urgent questions. Please call the main number to the clinic Dept: [phone] and follow the prompts.   For any non-urgent questions, you may also contact your provider using MyChart. We now offer e-Visits for anyone 37 and older to request care online for non-urgent symptoms. For details visit [email or web address].   Also download the MyChart app! Go to the app store, search "MyChart", open the app, select [place], and log in with your MyChart username and password.

## 2023-10-06 ENCOUNTER — Ambulatory Visit: Payer: Medicare Other

## 2023-10-10 ENCOUNTER — Other Ambulatory Visit: Payer: Medicare Other

## 2023-10-10 ENCOUNTER — Inpatient Hospital Stay: Payer: Medicare Other

## 2023-10-10 ENCOUNTER — Ambulatory Visit: Payer: Medicare Other

## 2023-10-10 ENCOUNTER — Telehealth: Payer: Self-pay

## 2023-10-10 ENCOUNTER — Encounter: Payer: Self-pay | Admitting: Physician Assistant

## 2023-10-10 ENCOUNTER — Ambulatory Visit: Payer: Medicare Other | Admitting: Internal Medicine

## 2023-10-10 ENCOUNTER — Other Ambulatory Visit: Payer: Self-pay | Admitting: Physician Assistant

## 2023-10-10 DIAGNOSIS — D509 Iron deficiency anemia, unspecified: Secondary | ICD-10-CM

## 2023-10-10 DIAGNOSIS — Z5111 Encounter for antineoplastic chemotherapy: Secondary | ICD-10-CM | POA: Diagnosis not present

## 2023-10-10 DIAGNOSIS — C3401 Malignant neoplasm of right main bronchus: Secondary | ICD-10-CM

## 2023-10-10 LAB — CMP (CANCER CENTER ONLY)
ALT: 7 U/L (ref 0–44)
AST: 10 U/L — ABNORMAL LOW (ref 15–41)
Albumin: 3.6 g/dL (ref 3.5–5.0)
Alkaline Phosphatase: 86 U/L (ref 38–126)
Anion gap: 10 (ref 5–15)
BUN: 28 mg/dL — ABNORMAL HIGH (ref 8–23)
CO2: 28 mmol/L (ref 22–32)
Calcium: 9.4 mg/dL (ref 8.9–10.3)
Chloride: 102 mmol/L (ref 98–111)
Creatinine: 0.71 mg/dL (ref 0.44–1.00)
GFR, Estimated: >60 mL/min (ref >60)
Glucose, Bld: 87 mg/dL (ref 70–99)
Potassium: 4.8 mmol/L (ref 3.5–5.1)
Sodium: 140 mmol/L (ref 135–145)
Total Bilirubin: 0.3 mg/dL (ref <1.2)
Total Protein: 6.3 g/dL — ABNORMAL LOW (ref 6.5–8.1)

## 2023-10-10 LAB — CBC WITH DIFFERENTIAL (CANCER CENTER ONLY)
Abs Immature Granulocytes: 0 10*3/uL (ref 0.00–0.07)
Band Neutrophils: 1 %
Basophils Absolute: 0 10*3/uL (ref 0.0–0.1)
Basophils Relative: 2 %
Eosinophils Absolute: 0.1 10*3/uL (ref 0.0–0.5)
Eosinophils Relative: 3 %
HCT: 27 % — ABNORMAL LOW (ref 36.0–46.0)
Hemoglobin: 9.2 g/dL — ABNORMAL LOW (ref 12.0–15.0)
Lymphocytes Relative: 27 %
Lymphs Abs: 0.5 10*3/uL — ABNORMAL LOW (ref 0.7–4.0)
MCH: 35 pg — ABNORMAL HIGH (ref 26.0–34.0)
MCHC: 34.1 g/dL (ref 30.0–36.0)
MCV: 102.7 fL — ABNORMAL HIGH (ref 80.0–100.0)
Monocytes Absolute: 0 10*3/uL — ABNORMAL LOW (ref 0.1–1.0)
Monocytes Relative: 1 %
Neutro Abs: 1.1 10*3/uL — ABNORMAL LOW (ref 1.7–7.7)
Neutrophils Relative %: 66 %
Platelet Count: 42 10*3/uL — ABNORMAL LOW (ref 150–400)
RBC: 2.63 MIL/uL — ABNORMAL LOW (ref 3.87–5.11)
RDW: 17.1 % — ABNORMAL HIGH (ref 11.5–15.5)
WBC Count: 1.7 10*3/uL — ABNORMAL LOW (ref 4.0–10.5)
nRBC: 0 % (ref 0.0–0.2)

## 2023-10-10 LAB — SAMPLE TO BLOOD BANK

## 2023-10-10 LAB — MAGNESIUM: Magnesium: 1.5 mg/dL — ABNORMAL LOW (ref 1.7–2.4)

## 2023-10-10 MED ORDER — MAGNESIUM OXIDE -MG SUPPLEMENT 400 (240 MG) MG PO TABS: 400.0000 mg | ORAL_TABLET | Freq: Every day | ORAL | 0 refills | Status: DC

## 2023-10-10 NOTE — Telephone Encounter (Signed)
Tried to reach patient but the voicemail box is full in regards to lab results. Per Cassie, PA patients hgb 9.2 and plt 42. Patient needs CBC checked on Thursday.  Will continue to call patient and daughter throughout the day.

## 2023-10-10 NOTE — Progress Notes (Signed)
 [name] OFFICE PROGRESS NOTE  Brenda Fallow, MD [address] [place] [address]  DIAGNOSIS: Limited stage small cell lung cancer (T1c, N2, M0) small cell lung cancer. The patient presented with a hilar mass and right paratracheal lymphadenopathy. She was diagnosed in October 2024   PRIOR THERAPY: None  CURRENT THERAPY: Systemic chemotherapy with cisplatin  on day 1 and etoposide  100 mg/m on days 1, 2, and 3 IV every 3 weeks.  This will be with concurrent radiation.  First dose on 08/08/2023. Status post 3 cycles. Her dose of chemotherapy was slightly reduced to cisplatin  60 mg/m2 and etoposide  90 mg/m2.  Starting from cycle #4, we will reduce her etoposide  to 80 mg/m2    INTERVAL HISTORY: [name] 67 y.o. female returns  to the clinic today for a follow-up visit accompanied by her daughter.   In summary, the patient was recently diagnosed with limited stage small cell lung cancer. She tolerates her treatment well physically but she does have significant cytopenias with treatment that have been progressively getting worsen despite some dose reductions after cycle #3. With her last cycle of treatment, she had required GCSF injections x6, a blood transfusion, and 2 platelet transfusions.   She was last seen by Dr. Sherrod last week.  He gave her Levaquin  due to cough and low-grade fever.  Her cough has been subsiding.  She denies any more fevers.  She denies any abnormal bleeding.  Denies any upper respiratory infection, urinary changes, diarrhea, or abdominal pain.  She denies any unusual shortness of breath with exertion.  Has any chest pain or hemoptysis.  She denies any constipation.  Her main issue today is related to dysphagia and odynophagia.  This has not improved since she completed radiation last month.  They have been using Carafate  and Maalox.  The patient has been having a hard time even with liquids.  Due to the dysphagia, this  sometimes causes her to throw the food back up.  She recently completed nystatin  for possible thrush and she denies any thrush at this time.  She denies any mouth ulcers.   She is here for evaluation and to recheck her labs before considering starting cycle #4.     MEDICAL HISTORY: Past Medical History:  Diagnosis Date   Allergy    Anemia    Arthritis    Asthma    Blood transfusion without reported diagnosis    Cataract    COPD (chronic obstructive pulmonary disease) (HCC)    Duodenal ulcer    Emphysema of lung (HCC)    Family history of malignant neoplasm of gastrointestinal tract    GERD (gastroesophageal reflux disease)    Hepatitis    had in 6th grade pt unsure which kind   Hiatal hernia    HTN (hypertension) 10/03/2014   Hx of colonic polyps    Hyperlipemia    Internal hemorrhoids    Macular degeneration    Osteoporosis    Peripheral vascular disease (HCC)    Pneumonia    hx of   Shortness of breath dyspnea    with ambulation   Stroke (HCC)    Vitamin D  deficiency     ALLERGIES:  is allergic to decadron  [dexamethasone ], celebrex [celecoxib], crestor [rosuvastatin], doxycycline , penicillins, pravastatin, and hydrocodone -acetaminophen .  MEDICATIONS:  Current Outpatient Medications  Medication Sig Dispense Refill   albuterol  (PROVENTIL ) (2.5 MG/3ML) 0.083% nebulizer solution Take 3 mLs (2.5 mg total) by nebulization every 6 (six) hours as needed  for wheezing or shortness of breath. 75 mL 12   albuterol  (VENTOLIN  HFA) 108 (90 Base) MCG/ACT inhaler 2 Inhalations 15 to 20  minutes apart every 4 hours to Rescue Asthma 18 g 3   Ascorbic Acid (VITAMIN C) 1000 MG tablet Take 1,000 mg by mouth daily. Takes 7,000mg      aspirin  EC 81 MG tablet Take 1 tablet (81 mg total) by mouth daily. 30 tablet 0   Cholecalciferol  (VITAMIN D ) 50 MCG (2000 UT) CAPS Take by mouth. Takes 6,000 units     ezetimibe  (ZETIA ) 10 MG tablet Take 1 tablet (10 mg total) by mouth daily. 30 tablet 11    FeFum-FePoly-FA-B Cmp-C-Biot (INTEGRA PLUS ) CAPS Take 1 capsule by mouth every morning. 30 capsule 2   fluticasone -salmeterol (ADVAIR DISKUS) 250-50 MCG/ACT AEPB Inhale 1 puff into the lungs in the morning and at bedtime. 60 each 1   ipratropium-albuterol  (DUONEB) 0.5-2.5 (3) MG/3ML SOLN Take 3 mLs by nebulization every 4 (four) hours as needed. Max:6 doses per day 540 mL 0   iron  polysaccharides (NIFEREX) 150 MG capsule Take 1 capsule (150 mg total) by mouth daily. 30 capsule 2   IRON , FERROUS SULFATE , PO Take 65 mg by mouth 2 (two) times daily.     magic mouthwash (nystatin , diphenhydrAMINE , alum & mag hydroxide) suspension mixture Take 5 mLs by mouth 3 (three) times daily as needed for mouth pain. 240 mL 0   magnesium  oxide (MAG-OX) 400 (240 Mg) MG tablet Take 1 tablet (400 mg total) by mouth daily. 30 tablet 1   nystatin  (MYCOSTATIN ) 100000 UNIT/ML suspension Take 5 mLs (500,000 Units total) by mouth 4 (four) times daily. 60 mL 0   omeprazole  (PRILOSEC) 20 MG capsule TAKE 1 CAPSULE BY MOUTH TWICE DAILY TO PREVENT INDIGESTION & HEARTBURN 180 capsule 1   ondansetron  (ZOFRAN ) 8 MG tablet Take 1 tablet (8 mg total) by mouth every 8 (eight) hours as needed for nausea or vomiting. Starting 3 days after chemotherapy 30 tablet 1   prochlorperazine  (COMPAZINE ) 10 MG tablet Take 1 tablet (10 mg total) by mouth every 6 (six) hours as needed. 30 tablet 2   sucralfate  (CARAFATE ) 1 g tablet Take 1 tablet (1 g total) by mouth 4 (four) times daily. Dissolve each tablet in 15 cc water before use. 120 tablet 2   zinc gluconate 50 MG tablet Take 50 mg by mouth daily.     No current facility-administered medications for this visit.    SURGICAL HISTORY:  Past Surgical History:  Procedure Laterality Date   ABDOMINAL HYSTERECTOMY     APPENDECTOMY     CATARACT EXTRACTION, BILATERAL     COLONOSCOPY     ELBOW SURGERY Right    ENDARTERECTOMY Right 12/16/2014   Procedure: ENDARTERECTOMY CAROTID with Dacron patch;   Surgeon: Krystal JULIANNA Doing, MD;  Location: [facility] OR;  Service: Vascular;  Laterality: Right;   ESOPHAGOGASTRODUODENOSCOPY  10/2008   FINE NEEDLE ASPIRATION BIOPSY  07/25/2023   Procedure: FINE NEEDLE ASPIRATION BIOPSY;  Surgeon: Shelah Lamar RAMAN, MD;  Location: [facility] ENDOSCOPY;  Service: Pulmonary;;   HAND SURGERY Left    LUMBAR DISC SURGERY     TOTAL HIP ARTHROPLASTY Right    trigeminal neuralgia surgery     UPPER GASTROINTESTINAL ENDOSCOPY     VIDEO BRONCHOSCOPY WITH ENDOBRONCHIAL ULTRASOUND N/A 07/25/2023   Procedure: VIDEO BRONCHOSCOPY WITH ENDOBRONCHIAL ULTRASOUND;  Surgeon: Shelah Lamar RAMAN, MD;  Location: [facility] ENDOSCOPY;  Service: Pulmonary;  Laterality: N/A;   WRIST SURGERY Left  REVIEW OF SYSTEMS:   Review of Systems  Constitutional: Positive for fatigue and appetite change.  Negative for appetite  chills, fever and unexpected weight change.  HENT: Positive for dysphagia, sore throat, and odynophagia.  Negative for mouth sores and nose bleeding  Eyes: Negative for eye problems and icterus.  Respiratory: Proving cough.  Negative for hemoptysis, shortness of breath and wheezing.   Cardiovascular: Negative for chest pain and leg swelling.  Gastrointestinal: Nausea mostly related to food getting stuck. Negative for abdominal pain, constipation, diarrhea.  Genitourinary: Negative for bladder incontinence, difficulty urinating, dysuria, frequency and hematuria.   Musculoskeletal: Negative for back pain, gait problem, neck pain and neck stiffness.  Skin: Negative for itching and rash.  Neurological: Negative for dizziness, extremity weakness, gait problem, headaches, light-headedness and seizures.  Hematological: Negative for adenopathy. Does not bruise/bleed easily.  Psychiatric/Behavioral: Negative for confusion, depression and sleep disturbance. The patient is not nervous/anxious.     PHYSICAL EXAMINATION:  There were no vitals taken for this visit.  ECOG PERFORMANCE STATUS: 1  Physical Exam   Constitutional: Oriented to person, place, and time and thin appearing female, and in no distress. HENT:  Head: Normocephalic and atraumatic.  Mouth/Throat: Oropharynx is clear and moist. No oropharyngeal exudate.  Eyes: Conjunctivae are normal. Right eye exhibits no discharge. Left eye exhibits no discharge. No scleral icterus.  Neck: Normal range of motion. Neck supple.  Cardiovascular: Tachycardic, regular rhythm, normal heart sounds and intact distal pulses.   Pulmonary/Chest: Effort normal and breath sounds normal. No respiratory distress. No wheezes. No rales.  Abdominal: Soft. Bowel sounds are normal. Exhibits no distension and no mass. There is no tenderness.  Musculoskeletal: Normal range of motion. Exhibits no edema.  Lymphadenopathy:    No cervical adenopathy.  Neurological: Alert and oriented to person, place, and time. Exhibits muscle wasting.  Gait normal. Coordination normal.  Skin: Skin is warm and dry. No rash noted. Not diaphoretic. No erythema. No pallor.  Psychiatric: Mood, memory and judgment normal.  Vitals reviewed.  LABORATORY DATA: Lab Results  Component Value Date   WBC 1.7 (L) 10/10/2023   HGB 9.2 (L) 10/10/2023   HCT 27.0 (L) 10/10/2023   MCV 102.7 (H) 10/10/2023   PLT 42 (L) 10/10/2023      Chemistry      Component Value Date/Time   NA 139 10/03/2023 0752   NA 141 09/02/2016 1147   K 3.9 10/03/2023 0752   K 4.5 09/02/2016 1147   CL 106 10/03/2023 0752   CO2 26 10/03/2023 0752   CO2 20 (L) 09/02/2016 1147   BUN 6 (L) 10/03/2023 0752   BUN 13.1 09/02/2016 1147   CREATININE 0.52 10/03/2023 0752   CREATININE 0.70 05/11/2023 0930   CREATININE 0.8 09/02/2016 1147      Component Value Date/Time   CALCIUM  8.8 (L) 10/03/2023 0752   CALCIUM  9.7 09/02/2016 1147   ALKPHOS 96 10/03/2023 0752   ALKPHOS 77 09/02/2016 1147   AST 10 (L) 10/03/2023 0752   AST 16 09/02/2016 1147   ALT <5 10/03/2023 0752   ALT 9 09/02/2016 1147   BILITOT 0.3 10/03/2023  0752   BILITOT <0.22 09/02/2016 1147       RADIOGRAPHIC STUDIES:  No results found.   ASSESSMENT/PLAN:  This is a very pleasant 67 year old Caucasian female with newly diagnosed limited stage small cell lung cancer (T1c, N2, M0) small cell lung cancer.  The patient presented with a hilar mass and right paratracheal lymphadenopathy.  She was  diagnosed in October 2024.    She is currently undergoing radiation and chemotherapy with cisplatin  on day 1 and etoposide  100 mg/m on days 1, 2, and 3 IV every 3 weeks.  She underwent her first cycle of treatment on 08/08/2023. She is status post 3 cycles. Her dose of cisplatin  was reduced to 60 mg/m2 and etoposide  90 mg/m2 with cycle #3.     She also had significant thrombocytopenia in the interval and required two platelet transfusions.   I reviewed her labs with Dr. Sherrod.  Her platelet count is 51,000 today.  Her labs are not acceptable for treatment today.  We will defer her treatment back by 1 week.  Dr. Sherrod or myself will see her prior to receiving cycle #4.  We will reduce her dose of etoposide  to 80 mg/m per Dr. Sherrod.  We will reach out to pharmacy to adjust the dose and the care plan.  Instead, we will arrange for IV fluids today.  The patient and her daughter were advised to call the clinic if she feels like she needs additional IV fluid this week.  We will reach out to radiation oncology about other options regarding her odynophagia and dysphagia which is likely secondary to her radiation.  I also encouraged the patient to reach out to her gastroenterologist to see if she needs endoscopic evaluation.  Since it has been 3 to 4 years since she seen Dr. Abran, they requested that I place a new referral.  I will send her a course of steroids with a Medrol  Dosepak to help improve her platelet count faster.  Dr. Sherrod or myself will arrange for repeat CT scan when we see her next week.  I briefly discussed PCI with the patient and  she will get more information when she has her 1 month follow-up visit with radiation oncology on 1/13  The patient was advised to call immediately if she has any concerning symptoms in the interval. The patient voices understanding of current disease status and treatment options and is in agreement with the current care plan. All questions were answered. The patient knows to call the clinic with any problems, questions or concerns. We can certainly see the patient much sooner if necessary      No orders of the defined types were placed in this encounter.    The total time spent in the appointment was 30-39 minutes  Mailey Landstrom L Clenton Esper, PA-C 10/10/23

## 2023-10-10 NOTE — Telephone Encounter (Signed)
Spoke with patients daughter and made a lab appt on Thursday 12/26 @ 12.  Reviewed bleeding precautions with daughter.

## 2023-10-13 ENCOUNTER — Ambulatory Visit: Payer: Medicare Other

## 2023-10-13 ENCOUNTER — Telehealth: Payer: Self-pay | Admitting: Medical Oncology

## 2023-10-13 ENCOUNTER — Inpatient Hospital Stay: Payer: Medicare Other

## 2023-10-13 ENCOUNTER — Other Ambulatory Visit: Payer: Self-pay | Admitting: Medical Oncology

## 2023-10-13 VITALS — BP 122/77 | HR 83 | Temp 98.3°F | Resp 16

## 2023-10-13 DIAGNOSIS — D696 Thrombocytopenia, unspecified: Secondary | ICD-10-CM

## 2023-10-13 DIAGNOSIS — C3401 Malignant neoplasm of right main bronchus: Secondary | ICD-10-CM

## 2023-10-13 DIAGNOSIS — D509 Iron deficiency anemia, unspecified: Secondary | ICD-10-CM

## 2023-10-13 DIAGNOSIS — R918 Other nonspecific abnormal finding of lung field: Secondary | ICD-10-CM

## 2023-10-13 DIAGNOSIS — Z5111 Encounter for antineoplastic chemotherapy: Secondary | ICD-10-CM | POA: Diagnosis not present

## 2023-10-13 LAB — CMP (CANCER CENTER ONLY)
ALT: 6 U/L (ref 0–44)
AST: 11 U/L — ABNORMAL LOW (ref 15–41)
Albumin: 3.5 g/dL (ref 3.5–5.0)
Alkaline Phosphatase: 76 U/L (ref 38–126)
Anion gap: 6 (ref 5–15)
BUN: 18 mg/dL (ref 8–23)
CO2: 26 mmol/L (ref 22–32)
Calcium: 8.8 mg/dL — ABNORMAL LOW (ref 8.9–10.3)
Chloride: 106 mmol/L (ref 98–111)
Creatinine: 0.58 mg/dL (ref 0.44–1.00)
GFR, Estimated: >60 mL/min (ref >60)
Glucose, Bld: 87 mg/dL (ref 70–99)
Potassium: 4.5 mmol/L (ref 3.5–5.1)
Sodium: 138 mmol/L (ref 135–145)
Total Bilirubin: 0.2 mg/dL (ref <1.2)
Total Protein: 6.3 g/dL — ABNORMAL LOW (ref 6.5–8.1)

## 2023-10-13 LAB — CBC WITH DIFFERENTIAL (CANCER CENTER ONLY)
Abs Immature Granulocytes: 0.01 10*3/uL (ref 0.00–0.07)
Basophils Absolute: 0 10*3/uL (ref 0.0–0.1)
Basophils Relative: 1 %
Eosinophils Absolute: 0 10*3/uL (ref 0.0–0.5)
Eosinophils Relative: 3 %
HCT: 22.7 % — ABNORMAL LOW (ref 36.0–46.0)
Hemoglobin: 7.6 g/dL — ABNORMAL LOW (ref 12.0–15.0)
Immature Granulocytes: 1 %
Lymphocytes Relative: 50 %
Lymphs Abs: 0.3 10*3/uL — ABNORMAL LOW (ref 0.7–4.0)
MCH: 34.1 pg — ABNORMAL HIGH (ref 26.0–34.0)
MCHC: 33.5 g/dL (ref 30.0–36.0)
MCV: 101.8 fL — ABNORMAL HIGH (ref 80.0–100.0)
Monocytes Absolute: 0.1 10*3/uL (ref 0.1–1.0)
Monocytes Relative: 7 %
Neutro Abs: 0.3 10*3/uL — CL (ref 1.7–7.7)
Neutrophils Relative %: 38 %
Platelet Count: 12 10*3/uL — ABNORMAL LOW (ref 150–400)
RBC: 2.23 MIL/uL — ABNORMAL LOW (ref 3.87–5.11)
RDW: 16.6 % — ABNORMAL HIGH (ref 11.5–15.5)
Smear Review: NORMAL
WBC Count: 0.7 10*3/uL — CL (ref 4.0–10.5)
nRBC: 0 % (ref 0.0–0.2)

## 2023-10-13 LAB — PREPARE RBC (CROSSMATCH)

## 2023-10-13 LAB — SAMPLE TO BLOOD BANK

## 2023-10-13 MED ORDER — SODIUM CHLORIDE 0.9% IV SOLUTION
250.0000 mL | INTRAVENOUS | Status: DC
  Administered 2023-10-13: 250 mL via INTRAVENOUS

## 2023-10-13 MED ORDER — FILGRASTIM-SNDZ 300 MCG/0.5ML IJ SOSY
300.0000 ug | PREFILLED_SYRINGE | Freq: Once | INTRAMUSCULAR | Status: AC
  Administered 2023-10-13: 300 ug via SUBCUTANEOUS
  Filled 2023-10-13: qty 0.5

## 2023-10-13 NOTE — Telephone Encounter (Signed)
CRITICAL VALUE STICKER  CRITICAL VALUE: WBC=0.7  RECEIVER (on-site recipient of call): Vincent Peyer  DATE & TIME NOTIFIED: 10/13/23 @ 1235  MESSENGER (representative from lab):Pam  MD NOTIFIED: Arbutus Ped  TIME OF NOTIFICATION:1245  RESPONSE:  Filgrastim injection , plts and blood transfusion ordered.

## 2023-10-13 NOTE — Progress Notes (Signed)
Platelet and blood transfusion orders entered.

## 2023-10-13 NOTE — Progress Notes (Signed)
Zarxio 300 mcg ordered for 12/26 - 12/28 as requested per MD.  Richardean Sale, RPH, BCPS, BCOP 10/13/2023 1:44 PM

## 2023-10-14 ENCOUNTER — Inpatient Hospital Stay: Payer: Medicare Other

## 2023-10-14 ENCOUNTER — Encounter: Payer: Self-pay | Admitting: Internal Medicine

## 2023-10-14 VITALS — BP 105/56 | HR 88 | Temp 98.6°F | Resp 16

## 2023-10-14 DIAGNOSIS — D696 Thrombocytopenia, unspecified: Secondary | ICD-10-CM

## 2023-10-14 DIAGNOSIS — C3401 Malignant neoplasm of right main bronchus: Secondary | ICD-10-CM

## 2023-10-14 DIAGNOSIS — Z5111 Encounter for antineoplastic chemotherapy: Secondary | ICD-10-CM | POA: Diagnosis not present

## 2023-10-14 LAB — BPAM PLATELET PHERESIS
Blood Product Expiration Date: 202412282359
ISSUE DATE / TIME: 202412261410
Unit Type and Rh: 5100

## 2023-10-14 LAB — PREPARE PLATELET PHERESIS: Unit division: 0

## 2023-10-14 MED ORDER — SODIUM CHLORIDE 0.9 % IV SOLN: INTRAVENOUS | Status: DC

## 2023-10-14 MED ORDER — FILGRASTIM-SNDZ 300 MCG/0.5ML IJ SOSY
300.0000 ug | PREFILLED_SYRINGE | Freq: Once | INTRAMUSCULAR | Status: AC
  Administered 2023-10-14: 300 ug via SUBCUTANEOUS
  Filled 2023-10-14: qty 0.5

## 2023-10-14 MED ORDER — DIPHENHYDRAMINE HCL 25 MG PO CAPS
25.0000 mg | ORAL_CAPSULE | Freq: Once | ORAL | Status: AC
  Administered 2023-10-14: 25 mg via ORAL
  Filled 2023-10-14: qty 1

## 2023-10-14 NOTE — Progress Notes (Signed)
Patient took tylenol at home earlier today.

## 2023-10-14 NOTE — Patient Instructions (Signed)

## 2023-10-15 ENCOUNTER — Inpatient Hospital Stay: Payer: Medicare Other

## 2023-10-15 VITALS — BP 123/56 | HR 113 | Temp 98.1°F | Resp 19

## 2023-10-15 DIAGNOSIS — C3401 Malignant neoplasm of right main bronchus: Secondary | ICD-10-CM

## 2023-10-15 DIAGNOSIS — Z5111 Encounter for antineoplastic chemotherapy: Secondary | ICD-10-CM | POA: Diagnosis not present

## 2023-10-15 MED ORDER — FILGRASTIM-SNDZ 300 MCG/0.5ML IJ SOSY
300.0000 ug | PREFILLED_SYRINGE | Freq: Once | INTRAMUSCULAR | Status: AC
Start: 2023-10-15 — End: 2023-10-15
  Administered 2023-10-15: 300 ug via SUBCUTANEOUS

## 2023-10-17 ENCOUNTER — Telehealth: Payer: Self-pay

## 2023-10-17 ENCOUNTER — Other Ambulatory Visit: Payer: Medicare Other

## 2023-10-17 ENCOUNTER — Telehealth: Payer: Self-pay | Admitting: *Deleted

## 2023-10-17 ENCOUNTER — Ambulatory Visit: Payer: Medicare Other | Admitting: Physician Assistant

## 2023-10-17 ENCOUNTER — Ambulatory Visit: Payer: Medicare Other

## 2023-10-17 ENCOUNTER — Inpatient Hospital Stay: Payer: Medicare Other

## 2023-10-17 ENCOUNTER — Other Ambulatory Visit: Payer: Self-pay | Admitting: Physician Assistant

## 2023-10-17 DIAGNOSIS — D509 Iron deficiency anemia, unspecified: Secondary | ICD-10-CM

## 2023-10-17 DIAGNOSIS — D696 Thrombocytopenia, unspecified: Secondary | ICD-10-CM

## 2023-10-17 DIAGNOSIS — C3401 Malignant neoplasm of right main bronchus: Secondary | ICD-10-CM

## 2023-10-17 DIAGNOSIS — Z5111 Encounter for antineoplastic chemotherapy: Secondary | ICD-10-CM | POA: Diagnosis not present

## 2023-10-17 LAB — TYPE AND SCREEN
ABO/RH(D): B POS
Antibody Screen: NEGATIVE
Unit division: 0

## 2023-10-17 LAB — CBC WITH DIFFERENTIAL (CANCER CENTER ONLY)
Abs Immature Granulocytes: 0 10*3/uL (ref 0.00–0.07)
Basophils Absolute: 0 10*3/uL (ref 0.0–0.1)
Basophils Relative: 1 %
Eosinophils Absolute: 0 10*3/uL (ref 0.0–0.5)
Eosinophils Relative: 3 %
HCT: 28 % — ABNORMAL LOW (ref 36.0–46.0)
Hemoglobin: 9.5 g/dL — ABNORMAL LOW (ref 12.0–15.0)
Immature Granulocytes: 0 %
Lymphocytes Relative: 52 %
Lymphs Abs: 0.6 10*3/uL — ABNORMAL LOW (ref 0.7–4.0)
MCH: 34.4 pg — ABNORMAL HIGH (ref 26.0–34.0)
MCHC: 33.9 g/dL (ref 30.0–36.0)
MCV: 101.4 fL — ABNORMAL HIGH (ref 80.0–100.0)
Monocytes Absolute: 0.3 10*3/uL (ref 0.1–1.0)
Monocytes Relative: 26 %
Neutro Abs: 0.2 10*3/uL — CL (ref 1.7–7.7)
Neutrophils Relative %: 18 %
Platelet Count: 12 10*3/uL — ABNORMAL LOW (ref 150–400)
RBC: 2.76 MIL/uL — ABNORMAL LOW (ref 3.87–5.11)
RDW: 15.7 % — ABNORMAL HIGH (ref 11.5–15.5)
WBC Count: 1.1 10*3/uL — ABNORMAL LOW (ref 4.0–10.5)
nRBC: 0 % (ref 0.0–0.2)

## 2023-10-17 LAB — CMP (CANCER CENTER ONLY)
ALT: 5 U/L (ref 0–44)
AST: 7 U/L — ABNORMAL LOW (ref 15–41)
Albumin: 3.5 g/dL (ref 3.5–5.0)
Alkaline Phosphatase: 85 U/L (ref 38–126)
Anion gap: 9 (ref 5–15)
BUN: 9 mg/dL (ref 8–23)
CO2: 25 mmol/L (ref 22–32)
Calcium: 9.2 mg/dL (ref 8.9–10.3)
Chloride: 106 mmol/L (ref 98–111)
Creatinine: 0.56 mg/dL (ref 0.44–1.00)
GFR, Estimated: >60 mL/min (ref >60)
Glucose, Bld: 82 mg/dL (ref 70–99)
Potassium: 4.4 mmol/L (ref 3.5–5.1)
Sodium: 140 mmol/L (ref 135–145)
Total Bilirubin: 0.3 mg/dL (ref <1.2)
Total Protein: 6.5 g/dL (ref 6.5–8.1)

## 2023-10-17 LAB — BPAM RBC
Blood Product Expiration Date: 202501242359
ISSUE DATE / TIME: 202412271252
Unit Type and Rh: 1700

## 2023-10-17 LAB — SAMPLE TO BLOOD BANK

## 2023-10-17 LAB — MAGNESIUM: Magnesium: 1.9 mg/dL (ref 1.7–2.4)

## 2023-10-17 NOTE — Telephone Encounter (Signed)
Spoke with patients daughter, [name].  Per Cassie, PA- patients plt count 12k. Patient needs plt infusion. Patient is being scheduled for plts 12/31 at 1 with possible fluids.   Pt's daughter stated that pt has been throwing up for the last few days.  Pt recently increased magnesium supplement to 3x daily.  Pt stopped taking magnesium supplement and the vomiting has subsided.  Pt will resume take magnesium tomorrow but 2x daily.   Pt's daughter also stated that pt has not been feeling the best and a knot has came up on her neck and wants to be seen with Cassie, PA or Dr. Arbutus Ped. Appt made for 12/31 at 345 with Dr. Arbutus Ped. Informed pts daughter to call us if she needs anything.

## 2023-10-17 NOTE — Telephone Encounter (Signed)
Spoke with patients daughter in regards to lab results.  Per Cassie, PA-  ANC 0.2, Zarxio injection for 2 days and recheck labs on Thursday.  Informed patients daughter and made lab and injection appt for Thursday.

## 2023-10-17 NOTE — Telephone Encounter (Signed)
CRITICAL VALUE STICKER  CRITICAL VALUE: ANC 0.2  RECEIVER (on-site recipient of call): Elmyra Ricks, RN  DATE & TIME NOTIFIED: 10/17/2023 @ 9:54 am  MESSENGER (representative from lab): Shanda Bumps  MD NOTIFIED: Cassie    TIME OF NOTIFICATION: 09:55 am  RESPONSE: she will communicate to patient.

## 2023-10-18 ENCOUNTER — Other Ambulatory Visit: Payer: Self-pay | Admitting: Physician Assistant

## 2023-10-18 ENCOUNTER — Inpatient Hospital Stay (HOSPITAL_BASED_OUTPATIENT_CLINIC_OR_DEPARTMENT_OTHER): Payer: Medicare Other | Admitting: Internal Medicine

## 2023-10-18 ENCOUNTER — Inpatient Hospital Stay: Payer: Medicare Other

## 2023-10-18 VITALS — BP 129/64 | HR 97 | Temp 98.4°F | Resp 17 | Wt 80.1 lb

## 2023-10-18 DIAGNOSIS — C3411 Malignant neoplasm of upper lobe, right bronchus or lung: Secondary | ICD-10-CM | POA: Diagnosis not present

## 2023-10-18 DIAGNOSIS — D508 Other iron deficiency anemias: Secondary | ICD-10-CM

## 2023-10-18 DIAGNOSIS — C3401 Malignant neoplasm of right main bronchus: Secondary | ICD-10-CM

## 2023-10-18 DIAGNOSIS — D696 Thrombocytopenia, unspecified: Secondary | ICD-10-CM

## 2023-10-18 DIAGNOSIS — Z5111 Encounter for antineoplastic chemotherapy: Secondary | ICD-10-CM | POA: Diagnosis not present

## 2023-10-18 DIAGNOSIS — T451X5A Adverse effect of antineoplastic and immunosuppressive drugs, initial encounter: Secondary | ICD-10-CM

## 2023-10-18 MED ORDER — FILGRASTIM-SNDZ 300 MCG/0.5ML IJ SOSY
300.0000 ug | PREFILLED_SYRINGE | Freq: Once | INTRAMUSCULAR | Status: AC
Start: 2023-10-18 — End: 2023-10-18
  Administered 2023-10-18: 300 ug via SUBCUTANEOUS
  Filled 2023-10-18: qty 0.5

## 2023-10-18 MED ORDER — LEVOFLOXACIN 500 MG PO TABS: 500.0000 mg | ORAL_TABLET | Freq: Every day | ORAL | 0 refills | Status: DC

## 2023-10-18 MED ORDER — SODIUM CHLORIDE 0.9 % IV SOLN
Freq: Once | INTRAVENOUS | Status: AC
Start: 2023-10-18 — End: 2023-10-18

## 2023-10-18 MED ORDER — DIPHENHYDRAMINE HCL 25 MG PO CAPS
25.0000 mg | ORAL_CAPSULE | Freq: Once | ORAL | Status: AC
  Administered 2023-10-18: 25 mg via ORAL
  Filled 2023-10-18: qty 1

## 2023-10-18 MED ORDER — SODIUM CHLORIDE 0.9% IV SOLUTION
250.0000 mL | INTRAVENOUS | Status: DC
  Administered 2023-10-18: 100 mL via INTRAVENOUS

## 2023-10-18 NOTE — Progress Notes (Signed)
 [facility] [phone]   [phone]  OFFICE PROGRESS NOTE  [name], MD [address] [place] [address]  DIAGNOSIS: Limited stage small cell lung cancer (T1c, N2, M0) small cell lung cancer. The patient presented with a hilar mass and right paratracheal lymphadenopathy. She was diagnosed in October 2024.    PRIOR THERAPY: None   CURRENT THERAPY: Systemic chemotherapy with cisplatin  on day 1 and etoposide  100 mg/m on days 1, 2, and 3 IV every 3 weeks.  This is concurrent radiation.  First dose on 08/08/2023. Status post 3 cycles.   INTERVAL HISTORY: [name] 67 y.o. female returns to the clinic today for follow-up visit accompanied by her daughter.Discussed the use of AI scribe software for clinical note transcription with the patient, who gave verbal consent to proceed.  History of Present Illness   The patient, a 67 year old with a history of limited stage small cell cancer, completed a course of chemotherapy with cisplatin  and etoposide  in October 2024. She has also completed a concurrent course of radiation therapy. Recently, the patient has been experiencing a decrease in white blood cell count, absolute neutrophil count, and platelets, which is common post-chemotherapy. She has been receiving Zarxio  injections to boost her white blood cell count and has also received platelets and a unit of blood due to low platelet count.  The patient has been feeling unwell and has developed a wet-sounding cough, which may be indicative of bronchitis. She has also been experiencing chills but no significant fever. The patient has not reported any bleeding, but has been bruising easily. She has also been experiencing difficulty swallowing, which has worsened over the past week. This is likely a side effect of the radiation therapy. She has been using Carafate  to manage this symptom, but it has not been very effective.  The  patient has also been taking magnesium  supplements for low magnesium  levels. However, she has had to reduce the dosage due to gastrointestinal side effects. Despite these challenges, the patient is due to start her fourth year of treatment next week.       MEDICAL HISTORY: Past Medical History:  Diagnosis Date   Allergy    Anemia    Arthritis    Asthma    Blood transfusion without reported diagnosis    Cataract    COPD (chronic obstructive pulmonary disease) (HCC)    Duodenal ulcer    Emphysema of lung (HCC)    Family history of malignant neoplasm of gastrointestinal tract    GERD (gastroesophageal reflux disease)    Hepatitis    had in 6th grade pt unsure which kind   Hiatal hernia    HTN (hypertension) 10/03/2014   Hx of colonic polyps    Hyperlipemia    Internal hemorrhoids    Macular degeneration    Osteoporosis    Peripheral vascular disease (HCC)    Pneumonia    hx of   Shortness of breath dyspnea    with ambulation   Stroke (HCC)    Vitamin D  deficiency     ALLERGIES:  is allergic to decadron  [dexamethasone ], celebrex [celecoxib], crestor [rosuvastatin], doxycycline , penicillins, pravastatin, and hydrocodone -acetaminophen .  MEDICATIONS:  Current Outpatient Medications  Medication Sig Dispense Refill   albuterol  (PROVENTIL ) (2.5 MG/3ML) 0.083% nebulizer solution Take 3 mLs (2.5 mg total) by nebulization every 6 (six) hours as needed for wheezing or shortness of breath. 75 mL 12   albuterol  (VENTOLIN  HFA) 108 (90  Base) MCG/ACT inhaler 2 Inhalations 15 to 20  minutes apart every 4 hours to Rescue Asthma 18 g 3   Ascorbic Acid (VITAMIN C) 1000 MG tablet Take 1,000 mg by mouth daily. Takes 7,000mg      aspirin  EC 81 MG tablet Take 1 tablet (81 mg total) by mouth daily. 30 tablet 0   Cholecalciferol  (VITAMIN D ) 50 MCG (2000 UT) CAPS Take by mouth. Takes 6,000 units     ezetimibe  (ZETIA ) 10 MG tablet Take 1 tablet (10 mg total) by mouth daily. 30 tablet 11    FeFum-FePoly-FA-B Cmp-C-Biot (INTEGRA PLUS ) CAPS Take 1 capsule by mouth every morning. 30 capsule 2   fluticasone -salmeterol (ADVAIR DISKUS) 250-50 MCG/ACT AEPB Inhale 1 puff into the lungs in the morning and at bedtime. 60 each 1   ipratropium-albuterol  (DUONEB) 0.5-2.5 (3) MG/3ML SOLN Take 3 mLs by nebulization every 4 (four) hours as needed. Max:6 doses per day 540 mL 0   iron  polysaccharides (NIFEREX) 150 MG capsule Take 1 capsule (150 mg total) by mouth daily. 30 capsule 2   IRON , FERROUS SULFATE , PO Take 65 mg by mouth 2 (two) times daily.     magic mouthwash (nystatin , diphenhydrAMINE , alum & mag hydroxide) suspension mixture Take 5 mLs by mouth 3 (three) times daily as needed for mouth pain. 240 mL 0   magnesium  oxide (MAG-OX) 400 (240 Mg) MG tablet Take 1 tablet (400 mg total) by mouth daily. 30 tablet 0   nystatin  (MYCOSTATIN ) 100000 UNIT/ML suspension Take 5 mLs (500,000 Units total) by mouth 4 (four) times daily. 60 mL 0   omeprazole  (PRILOSEC) 20 MG capsule TAKE 1 CAPSULE BY MOUTH TWICE DAILY TO PREVENT INDIGESTION & HEARTBURN 180 capsule 1   ondansetron  (ZOFRAN ) 8 MG tablet TAKE 1 TABLET (8 MG TOTAL) BY MOUTH EVERY 8 (EIGHT) HOURS AS NEEDED FOR NAUSEA OR VOMITING. STARTING 3 DAYS AFTER CHEMOTHERAPY 30 tablet 1   prochlorperazine  (COMPAZINE ) 10 MG tablet Take 1 tablet (10 mg total) by mouth every 6 (six) hours as needed. 30 tablet 2   sucralfate  (CARAFATE ) 1 g tablet Take 1 tablet (1 g total) by mouth 4 (four) times daily. Dissolve each tablet in 15 cc water before use. 120 tablet 2   zinc gluconate 50 MG tablet Take 50 mg by mouth daily.     No current facility-administered medications for this visit.   Facility-Administered Medications Ordered in Other Visits  Medication Dose Route Frequency Provider Last Rate Last Admin   0.9 %  sodium chloride  infusion (Manually program via Guardrails IV Fluids)  250 mL Intravenous Continuous Heilingoetter, Cassandra L, PA-C   Stopped at 10/18/23  1430    SURGICAL HISTORY:  Past Surgical History:  Procedure Laterality Date   ABDOMINAL HYSTERECTOMY     APPENDECTOMY     CATARACT EXTRACTION, BILATERAL     COLONOSCOPY     ELBOW SURGERY Right    ENDARTERECTOMY Right 12/16/2014   Procedure: ENDARTERECTOMY CAROTID with Dacron patch;  Surgeon: Krystal JULIANNA Doing, MD;  Location: [facility] OR;  Service: Vascular;  Laterality: Right;   ESOPHAGOGASTRODUODENOSCOPY  10/2008   FINE NEEDLE ASPIRATION BIOPSY  07/25/2023   Procedure: FINE NEEDLE ASPIRATION BIOPSY;  Surgeon: Shelah Lamar RAMAN, MD;  Location: [facility] ENDOSCOPY;  Service: Pulmonary;;   HAND SURGERY Left    LUMBAR DISC SURGERY     TOTAL HIP ARTHROPLASTY Right    trigeminal neuralgia surgery     UPPER GASTROINTESTINAL ENDOSCOPY     VIDEO BRONCHOSCOPY WITH ENDOBRONCHIAL ULTRASOUND N/A  07/25/2023   Procedure: VIDEO BRONCHOSCOPY WITH ENDOBRONCHIAL ULTRASOUND;  Surgeon: Shelah Lamar RAMAN, MD;  Location: [facility] ENDOSCOPY;  Service: Pulmonary;  Laterality: N/A;   WRIST SURGERY Left     REVIEW OF SYSTEMS:  Constitutional: positive for anorexia, fatigue, and weight loss Eyes: negative Ears, nose, mouth, throat, and face: negative Respiratory: positive for cough and sputum Cardiovascular: negative Gastrointestinal: positive for odynophagia Genitourinary:negative Integument/breast: negative Hematologic/lymphatic: negative Musculoskeletal:negative Neurological: negative Behavioral/Psych: negative Endocrine: negative Allergic/Immunologic: negative   PHYSICAL EXAMINATION: General appearance: alert, cooperative, fatigued, and no distress Head: Normocephalic, without obvious abnormality, atraumatic Neck: no adenopathy, no JVD, supple, symmetrical, trachea midline, and thyroid  not enlarged, symmetric, no tenderness/mass/nodules Lymph nodes: Cervical, supraclavicular, and axillary nodes normal. Resp: clear to auscultation bilaterally Back: symmetric, no curvature. ROM normal. No CVA tenderness. Cardio: regular rate  and rhythm, S1, S2 normal, no murmur, click, rub or gallop GI: soft, non-tender; bowel sounds normal; no masses,  no organomegaly Extremities: extremities normal, atraumatic, no cyanosis or edema Neurologic: Alert and oriented X 3, normal strength and tone. Normal symmetric reflexes. Normal coordination and gait  ECOG PERFORMANCE STATUS: 1 - Symptomatic but completely ambulatory  There were no vitals taken for this visit.  LABORATORY DATA: Lab Results  Component Value Date   WBC 1.1 (L) 10/17/2023   HGB 9.5 (L) 10/17/2023   HCT 28.0 (L) 10/17/2023   MCV 101.4 (H) 10/17/2023   PLT 12 (L) 10/17/2023      Chemistry      Component Value Date/Time   NA 140 10/17/2023 0820   NA 141 09/02/2016 1147   K 4.4 10/17/2023 0820   K 4.5 09/02/2016 1147   CL 106 10/17/2023 0820   CO2 25 10/17/2023 0820   CO2 20 (L) 09/02/2016 1147   BUN 9 10/17/2023 0820   BUN 13.1 09/02/2016 1147   CREATININE 0.56 10/17/2023 0820   CREATININE 0.70 05/11/2023 0930   CREATININE 0.8 09/02/2016 1147      Component Value Date/Time   CALCIUM  9.2 10/17/2023 0820   CALCIUM  9.7 09/02/2016 1147   ALKPHOS 85 10/17/2023 0820   ALKPHOS 77 09/02/2016 1147   AST 7 (L) 10/17/2023 0820   AST 16 09/02/2016 1147   ALT 5 10/17/2023 0820   ALT 9 09/02/2016 1147   BILITOT 0.3 10/17/2023 0820   BILITOT <0.22 09/02/2016 1147       RADIOGRAPHIC STUDIES: No results found.  ASSESSMENT AND PLAN: This is a very pleasant 67 years old white female with Limited stage small cell lung cancer (T1c, N2, M0) small cell lung cancer. The patient presented with a hilar mass and right paratracheal lymphadenopathy. She was diagnosed in October 2024. The patient is currently on systemic chemotherapy with cisplatin  60 Mg/M2 on day 1 and etoposide  90 Mg/M2 on days 1, 2 and 3 status post 3 cycles.  This was concurrent with radiotherapy for 6 weeks. She has been tolerating her treatment fairly well except for the chemotherapy-induced  pancytopenia and odynophagia and dysphagia secondary to concurrent radiotherapy.    Limited Stage Small Cell Lung Cancer Undergoing chemotherapy with cisplatin  and etoposide , completed concurrent radiation therapy. Experiencing leukopenia, neutropenia, anemia, and thrombocytopenia. Symptoms include productive cough, chills, and dysphagia due to radiation. Received platelets and Zarxio  injections to boost white blood count. Discussed delaying next chemotherapy if counts do not recover. Emphasized mask use in public to prevent infection. - Extend Zarxio  injection until Friday - Monitor blood counts - Delay next chemotherapy if counts do not recover - Schedule  a scan in February to assess treatment efficacy - Advise wearing a mask in public to prevent infection  Neutropenia Low white blood count and absolute neutrophil count. Increased infection risk. Discussed infection risks and need for immediate hospital visit if fever or chills develop. Prescribed Levaquin  prophylactically. - Administer Zarxio  injection until Friday - Monitor for signs of infection - Advise immediate hospital visit if fever or chills develop - Prescribe Levaquin  500 mg po daily for 7 days prophylactically  Thrombocytopenia Platelet count critically low at 12. No active bleeding, significant bruising noted. Received one unit of platelets. Discussed bleeding risks and need for monitoring. - Monitor platelet count - Administer additional platelets if necessary  Anemia Hemoglobin improved to 9.2 after previous transfusion. No further transfusion needed today. Discussed monitoring and potential need for future transfusions. - Monitor hemoglobin levels  Radiation-Induced Esophagitis Experiencing dysphagia and raw throat sensation. Currently using Carafate  with limited relief. Discussed potential use of Viscous lidocaine  for symptomatic relief and risks of aspiration. - Consider Viscous lidocaine  for symptomatic relief -  Monitor swallowing function  Hypomagnesemia Previously experienced issues with high-dose magnesium  supplementation. Currently tolerating twice-daily magnesium  without issues. Discussed continuing current regimen. - Continue magnesium  supplementation twice daily  Follow-up - Schedule follow-up appointment on January 6th - Ensure appointment for Zarxio  injection on Friday.   The patient was advised to call immediately if she has any other concerning symptoms in the interval. The patient voices understanding of current disease status and treatment options and is in agreement with the current care plan.  All questions were answered. The patient knows to call the clinic with any problems, questions or concerns. We can certainly see the patient much sooner if necessary.  The total time spent in the appointment was 30 minutes.  Disclaimer: This note was dictated with voice recognition software. Similar sounding words can inadvertently be transcribed and may not be corrected upon review.

## 2023-10-18 NOTE — Patient Instructions (Signed)
Managing Low Blood Counts During Cancer Treatment Cancer treatments, such as chemotherapy and radiation, may cause a drop in the number of blood cells in the body. These blood cells include red blood cells, white blood cells, and platelets. They are made in the body and sent into the blood to do certain tasks. Red blood cells carry gases such as oxygen and carbon dioxide to and from your lungs. White blood cells help protect you from infection. Platelets help your body form blood clots to prevent and control bleeding. When there is a drop in blood cell counts, your body may not have enough cells to do what it needs to do. This can cause problems. If your blood counts are low, you can take steps to help manage problems. How can low blood counts affect me? The problems you may have from low blood counts will depend on which blood cells are affected. If you have a low number of red blood cells, you have a condition called anemia. This can cause you to feel tired, weak, light-headed, or short of breath. If you have a low number of white blood cells, you may be more at risk for infections. If you have a low number of platelets, you may bleed and bruise easily. Your body may also have trouble stopping any bleeding. How to manage symptoms or prevent problems from a low blood count If you have a low blood count, you can take steps to help decrease symptoms and prevent other problems. The steps you need to take will depend on which type of blood cell is low. Low red blood cells To help manage the symptoms of anemia: Go for a walk or do some light exercise each day. Take short naps during the day. Eat foods with a lot of iron and protein. These include leafy green vegetables, meat and fish, beans, sweet potatoes, and dried fruit. Ask for help with errands and with work that needs to be done around the house. Take vitamins or supplements only as told by your health care provider. Find ways to relax. These  may include yoga or meditation.  Low white blood cells To help prevent infections: Keep your body clean. Make sure you: Wash your hands often with warm water and soap. If you get a scrape or cut, clean it right away. Clean yourself well after you use the bathroom. Tell your provider if you have any rectal sores or bleeding. Avoid contact with pet waste. Wash your hands after handling pets. Do not swim or wade in lakes, ponds, rivers, water parks, or hot tubs. Stay away from crowds of people and any person who has the flu or a fever. To protect yourself, you may need to wear a mask when you are around other people. People should be fever-free for 24 hours before you see them. Avoid dental work that you do not need. Check your mouth each day for sores or signs of infection. Do not share utensils. Avoid fresh flowers and plants or dried flowers. Follow food safety guidelines. Cook meat well and wash all raw fruits and vegetables.  Low platelets To help prevent or control bleeding and bruising: Use an electric razor for shaving instead of a blade. Use a soft toothbrush. Be careful when you clean your mouth and teeth. Ask your cancer care team whether you should avoid flossing. If your mouth is bleeding, rinse it with ice water. Avoid activities that could cause injury, such as contact sports. Talk with your provider about using   stool softeners to avoid constipation and straining during bowel movements. Do not use medicines such as ibuprofen, aspirin, or naproxen unless your provider tells you to. Limit alcohol use. If you drink alcohol: Limit how much you have to: 0-1 drink a day if you are female. 0-2 drinks a day if you are female. Know how much alcohol is in your drink. In the [place] one drink equals one 12 oz bottle of beer (355 mL), one 5 oz glass of wine (148 mL), or one 1 oz glass of hard liquor (44 mL). Monitor any bleeding. If you start bleeding, hold pressure on the area for 5 minutes  to stop the bleeding. Bleeding that does not stop is an emergency. What treatments can help increase a low blood count? Treatment that can help increase a low blood count will depend on the type of blood cell that is low and how severe your condition is. If told by your provider, you may need to: Take medicines to help promote the growth of white or red blood cells. You may also need to take iron, folic acid, or vitamin B12 supplements. Make changes to your diet. You may need more iron and protein. This can help your body make more red blood cells. Adjust your medicines or treatment plan to help raise blood counts. Have a blood transfusion. This may be done if your blood count is very low. Where to find more information American Cancer Society: cancer.org National Cancer Institute: cancer.gov Contact a health care provider if: You feel very tired and weak. You have more bruising or bleeding. You feel ill or get a cough. It hurts when you urinate, or you have blood in your urine or stool. You have pain in your abdomen, or you have diarrhea. You plan to take any new supplements or vitamins or make changes to your diet. You have any signs of an infection such as: Mouth sores or a sore throat. Swelling or redness. Fever or chills. A temperature of 100.4F (38C) or higher when you have low white blood cells. Get help right away if: You are short of breath or feel dizzy. You have bleeding that will not stop. This information is not intended to replace advice given to you by your health care provider. Make sure you discuss any questions you have with your health care provider. Document Revised: 04/28/2022 Document Reviewed: 04/28/2022 Elsevier Patient Education  2024 Elsevier Inc.  

## 2023-10-19 ENCOUNTER — Other Ambulatory Visit: Payer: Self-pay

## 2023-10-19 LAB — BPAM PLATELET PHERESIS
Blood Product Expiration Date: 202501022359
ISSUE DATE / TIME: 202412311342
Unit Type and Rh: 7300

## 2023-10-19 LAB — PREPARE PLATELET PHERESIS: Unit division: 0

## 2023-10-20 ENCOUNTER — Inpatient Hospital Stay: Payer: Medicare Other

## 2023-10-20 ENCOUNTER — Encounter: Payer: Self-pay | Admitting: Internal Medicine

## 2023-10-20 ENCOUNTER — Encounter: Payer: Self-pay | Admitting: Physician Assistant

## 2023-10-20 ENCOUNTER — Inpatient Hospital Stay: Payer: Medicare Other | Attending: Physician Assistant

## 2023-10-20 DIAGNOSIS — D509 Iron deficiency anemia, unspecified: Secondary | ICD-10-CM

## 2023-10-20 DIAGNOSIS — C3401 Malignant neoplasm of right main bronchus: Secondary | ICD-10-CM

## 2023-10-20 DIAGNOSIS — T451X5A Adverse effect of antineoplastic and immunosuppressive drugs, initial encounter: Secondary | ICD-10-CM

## 2023-10-20 DIAGNOSIS — Z5189 Encounter for other specified aftercare: Secondary | ICD-10-CM | POA: Insufficient documentation

## 2023-10-20 DIAGNOSIS — Z87891 Personal history of nicotine dependence: Secondary | ICD-10-CM | POA: Diagnosis not present

## 2023-10-20 DIAGNOSIS — D696 Thrombocytopenia, unspecified: Secondary | ICD-10-CM | POA: Diagnosis not present

## 2023-10-20 DIAGNOSIS — R1319 Other dysphagia: Secondary | ICD-10-CM | POA: Diagnosis not present

## 2023-10-20 DIAGNOSIS — Z79899 Other long term (current) drug therapy: Secondary | ICD-10-CM | POA: Insufficient documentation

## 2023-10-20 DIAGNOSIS — D508 Other iron deficiency anemias: Secondary | ICD-10-CM

## 2023-10-20 DIAGNOSIS — Z5111 Encounter for antineoplastic chemotherapy: Secondary | ICD-10-CM | POA: Diagnosis not present

## 2023-10-20 DIAGNOSIS — R1310 Dysphagia, unspecified: Secondary | ICD-10-CM | POA: Insufficient documentation

## 2023-10-20 LAB — CMP (CANCER CENTER ONLY)
ALT: <5 U/L (ref 0–44)
AST: 7 U/L — ABNORMAL LOW (ref 15–41)
Albumin: 3.3 g/dL — ABNORMAL LOW (ref 3.5–5.0)
Alkaline Phosphatase: 103 U/L (ref 38–126)
Anion gap: 4 — ABNORMAL LOW (ref 5–15)
BUN: 10 mg/dL (ref 8–23)
CO2: 28 mmol/L (ref 22–32)
Calcium: 9.1 mg/dL (ref 8.9–10.3)
Chloride: 106 mmol/L (ref 98–111)
Creatinine: 0.64 mg/dL (ref 0.44–1.00)
GFR, Estimated: >60 mL/min (ref >60)
Glucose, Bld: 127 mg/dL — ABNORMAL HIGH (ref 70–99)
Potassium: 4.3 mmol/L (ref 3.5–5.1)
Sodium: 138 mmol/L (ref 135–145)
Total Bilirubin: 0.2 mg/dL (ref 0.0–1.2)
Total Protein: 6.2 g/dL — ABNORMAL LOW (ref 6.5–8.1)

## 2023-10-20 LAB — CBC WITH DIFFERENTIAL (CANCER CENTER ONLY)
Abs Immature Granulocytes: 0.11 10*3/uL — ABNORMAL HIGH (ref 0.00–0.07)
Basophils Absolute: 0.1 10*3/uL (ref 0.0–0.1)
Basophils Relative: 1 %
Eosinophils Absolute: 0 10*3/uL (ref 0.0–0.5)
Eosinophils Relative: 0 %
HCT: 24.8 % — ABNORMAL LOW (ref 36.0–46.0)
Hemoglobin: 8.5 g/dL — ABNORMAL LOW (ref 12.0–15.0)
Immature Granulocytes: 2 %
Lymphocytes Relative: 13 %
Lymphs Abs: 0.8 10*3/uL (ref 0.7–4.0)
MCH: 35.1 pg — ABNORMAL HIGH (ref 26.0–34.0)
MCHC: 34.3 g/dL (ref 30.0–36.0)
MCV: 102.5 fL — ABNORMAL HIGH (ref 80.0–100.0)
Monocytes Absolute: 0.7 10*3/uL (ref 0.1–1.0)
Monocytes Relative: 10 %
Neutro Abs: 5 10*3/uL (ref 1.7–7.7)
Neutrophils Relative %: 74 %
Platelet Count: 29 10*3/uL — ABNORMAL LOW (ref 150–400)
RBC: 2.42 MIL/uL — ABNORMAL LOW (ref 3.87–5.11)
RDW: 16.1 % — ABNORMAL HIGH (ref 11.5–15.5)
Smear Review: NORMAL
WBC Count: 6.7 10*3/uL (ref 4.0–10.5)
nRBC: 0 % (ref 0.0–0.2)

## 2023-10-20 LAB — SAMPLE TO BLOOD BANK

## 2023-10-20 LAB — MAGNESIUM: Magnesium: 1.4 mg/dL — ABNORMAL LOW (ref 1.7–2.4)

## 2023-10-20 MED ORDER — FILGRASTIM-SNDZ 300 MCG/0.5ML IJ SOSY
300.0000 ug | PREFILLED_SYRINGE | Freq: Once | INTRAMUSCULAR | Status: AC
  Administered 2023-10-20: 300 ug via SUBCUTANEOUS
  Filled 2023-10-20: qty 0.5

## 2023-10-21 ENCOUNTER — Inpatient Hospital Stay: Payer: Medicare Other

## 2023-10-21 ENCOUNTER — Encounter: Payer: Self-pay | Admitting: Physician Assistant

## 2023-10-21 DIAGNOSIS — Z87891 Personal history of nicotine dependence: Secondary | ICD-10-CM | POA: Diagnosis not present

## 2023-10-21 DIAGNOSIS — D508 Other iron deficiency anemias: Secondary | ICD-10-CM

## 2023-10-21 DIAGNOSIS — Z5189 Encounter for other specified aftercare: Secondary | ICD-10-CM | POA: Diagnosis not present

## 2023-10-21 DIAGNOSIS — R1319 Other dysphagia: Secondary | ICD-10-CM | POA: Diagnosis not present

## 2023-10-21 DIAGNOSIS — T451X5A Adverse effect of antineoplastic and immunosuppressive drugs, initial encounter: Secondary | ICD-10-CM

## 2023-10-21 DIAGNOSIS — D696 Thrombocytopenia, unspecified: Secondary | ICD-10-CM | POA: Diagnosis not present

## 2023-10-21 DIAGNOSIS — Z5111 Encounter for antineoplastic chemotherapy: Secondary | ICD-10-CM | POA: Diagnosis not present

## 2023-10-21 DIAGNOSIS — Z79899 Other long term (current) drug therapy: Secondary | ICD-10-CM | POA: Diagnosis not present

## 2023-10-21 DIAGNOSIS — C3401 Malignant neoplasm of right main bronchus: Secondary | ICD-10-CM | POA: Diagnosis not present

## 2023-10-21 MED ORDER — FILGRASTIM-SNDZ 300 MCG/0.5ML IJ SOSY
300.0000 ug | PREFILLED_SYRINGE | Freq: Once | INTRAMUSCULAR | Status: AC
  Administered 2023-10-21: 300 ug via SUBCUTANEOUS

## 2023-10-21 MED FILL — Fosaprepitant Dimeglumine For IV Infusion 150 MG (Base Eq): INTRAVENOUS | Qty: 5 | Status: AC

## 2023-10-23 ENCOUNTER — Other Ambulatory Visit: Payer: Self-pay | Admitting: Physician Assistant

## 2023-10-23 DIAGNOSIS — D508 Other iron deficiency anemias: Secondary | ICD-10-CM

## 2023-10-24 ENCOUNTER — Telehealth: Payer: Self-pay | Admitting: Internal Medicine

## 2023-10-24 ENCOUNTER — Other Ambulatory Visit: Payer: Self-pay

## 2023-10-24 ENCOUNTER — Inpatient Hospital Stay: Payer: Medicare Other

## 2023-10-24 ENCOUNTER — Encounter: Payer: Self-pay | Admitting: *Deleted

## 2023-10-24 ENCOUNTER — Encounter: Payer: Self-pay | Admitting: Internal Medicine

## 2023-10-24 ENCOUNTER — Inpatient Hospital Stay: Payer: Medicare Other | Admitting: Physician Assistant

## 2023-10-24 ENCOUNTER — Other Ambulatory Visit: Payer: Self-pay | Admitting: Radiation Oncology

## 2023-10-24 ENCOUNTER — Telehealth: Payer: Self-pay | Admitting: *Deleted

## 2023-10-24 VITALS — BP 89/57 | HR 115 | Temp 98.6°F | Resp 16 | Ht 61.0 in | Wt 79.4 lb

## 2023-10-24 DIAGNOSIS — D696 Thrombocytopenia, unspecified: Secondary | ICD-10-CM | POA: Diagnosis not present

## 2023-10-24 DIAGNOSIS — Z87891 Personal history of nicotine dependence: Secondary | ICD-10-CM | POA: Diagnosis not present

## 2023-10-24 DIAGNOSIS — C3401 Malignant neoplasm of right main bronchus: Secondary | ICD-10-CM

## 2023-10-24 DIAGNOSIS — Z5111 Encounter for antineoplastic chemotherapy: Secondary | ICD-10-CM | POA: Diagnosis not present

## 2023-10-24 DIAGNOSIS — Z5189 Encounter for other specified aftercare: Secondary | ICD-10-CM | POA: Diagnosis not present

## 2023-10-24 DIAGNOSIS — Z79899 Other long term (current) drug therapy: Secondary | ICD-10-CM | POA: Diagnosis not present

## 2023-10-24 DIAGNOSIS — D508 Other iron deficiency anemias: Secondary | ICD-10-CM

## 2023-10-24 DIAGNOSIS — R1319 Other dysphagia: Secondary | ICD-10-CM | POA: Diagnosis not present

## 2023-10-24 DIAGNOSIS — R1310 Dysphagia, unspecified: Secondary | ICD-10-CM | POA: Diagnosis not present

## 2023-10-24 LAB — CBC WITH DIFFERENTIAL (CANCER CENTER ONLY)
Abs Immature Granulocytes: 0.24 10*3/uL — ABNORMAL HIGH (ref 0.00–0.07)
Basophils Absolute: 0 10*3/uL (ref 0.0–0.1)
Basophils Relative: 0 %
Eosinophils Absolute: 0.1 10*3/uL (ref 0.0–0.5)
Eosinophils Relative: 1 %
HCT: 27.3 % — ABNORMAL LOW (ref 36.0–46.0)
Hemoglobin: 8.9 g/dL — ABNORMAL LOW (ref 12.0–15.0)
Immature Granulocytes: 4 %
Lymphocytes Relative: 12 %
Lymphs Abs: 0.7 10*3/uL (ref 0.7–4.0)
MCH: 33.2 pg (ref 26.0–34.0)
MCHC: 32.6 g/dL (ref 30.0–36.0)
MCV: 101.9 fL — ABNORMAL HIGH (ref 80.0–100.0)
Monocytes Absolute: 1.2 10*3/uL — ABNORMAL HIGH (ref 0.1–1.0)
Monocytes Relative: 20 %
Neutro Abs: 3.8 10*3/uL (ref 1.7–7.7)
Neutrophils Relative %: 63 %
Platelet Count: 51 10*3/uL — ABNORMAL LOW (ref 150–400)
RBC: 2.68 MIL/uL — ABNORMAL LOW (ref 3.87–5.11)
RDW: 17 % — ABNORMAL HIGH (ref 11.5–15.5)
WBC Count: 5.9 10*3/uL (ref 4.0–10.5)
nRBC: 0 % (ref 0.0–0.2)

## 2023-10-24 LAB — CMP (CANCER CENTER ONLY)
ALT: 5 U/L (ref 0–44)
AST: 8 U/L — ABNORMAL LOW (ref 15–41)
Albumin: 3.5 g/dL (ref 3.5–5.0)
Alkaline Phosphatase: 109 U/L (ref 38–126)
Anion gap: 9 (ref 5–15)
BUN: 10 mg/dL (ref 8–23)
CO2: 26 mmol/L (ref 22–32)
Calcium: 9.1 mg/dL (ref 8.9–10.3)
Chloride: 105 mmol/L (ref 98–111)
Creatinine: 0.76 mg/dL (ref 0.44–1.00)
GFR, Estimated: >60 mL/min (ref >60)
Glucose, Bld: 96 mg/dL (ref 70–99)
Potassium: 4.7 mmol/L (ref 3.5–5.1)
Sodium: 140 mmol/L (ref 135–145)
Total Bilirubin: 0.3 mg/dL (ref 0.0–1.2)
Total Protein: 6.6 g/dL (ref 6.5–8.1)

## 2023-10-24 LAB — SAMPLE TO BLOOD BANK

## 2023-10-24 LAB — MAGNESIUM: Magnesium: 1.6 mg/dL — ABNORMAL LOW (ref 1.7–2.4)

## 2023-10-24 MED ORDER — METHYLPREDNISOLONE 4 MG PO TBPK: ORAL_TABLET | ORAL | 0 refills | Status: DC

## 2023-10-24 MED ORDER — AQUEOUS VITAMIN E 15 MG/0.67ML PO SOLN: 100.0000 [IU] | Freq: Every day | ORAL | 1 refills | Status: DC

## 2023-10-24 MED ORDER — FLUCONAZOLE 100 MG PO TABS: 100.0000 mg | ORAL_TABLET | Freq: Every day | ORAL | 0 refills | Status: DC

## 2023-10-24 MED ORDER — SODIUM CHLORIDE 0.9 % IV SOLN: Freq: Once | INTRAVENOUS | Status: AC

## 2023-10-24 NOTE — Progress Notes (Signed)
 Please see triage note from nursing. Pt with recent chemoRT for lung cancer who developed radiation esophagitis. She has been using carafate  without much improvement as well as liquid antacid. She was treated with nystatin  solution without improvement. She has an allergy to codeine  containing medication so we have not been able to consider narcotics. I recommend a lengthier course of antifungal given reported symptoms of white stringy mucous. A new rx was sent for Fluconazole  po for 14 days, and aqueous vitamin e  to encourage esophageal healing. She can also try OTC glutamine supplementation as well to foster healing.

## 2023-10-24 NOTE — Telephone Encounter (Signed)
 Spoke with the patient's daughter regarding continued irritation and pain in her throat since completion of radiation therapy 09/28/2023.  She reports feelings of everything getting stuck and her throat is raw.  She also reports she is hardly able to keep anything down, solids or liquids.  She is able to get in about 1-2 bites/sips before it all comes back out.  She has been using carafate  as prescribed 4 times per day and maalox 2-3 times per day with no relief.  She is also taking zofran  and compazine .  All concerns were discussed with the PA and prescriptions were sent in to the pharmacy for Fluconazole  and aqueous vitamin E .  She was encouraged to continue the carafate  and maalox.  She verbalized understanding.  She was informed if something should change I would give them a phone call.  Sharene EDISON RN, BSN

## 2023-10-25 ENCOUNTER — Inpatient Hospital Stay: Payer: Medicare Other

## 2023-10-25 ENCOUNTER — Inpatient Hospital Stay: Payer: Medicare Other | Admitting: Dietician

## 2023-10-26 ENCOUNTER — Inpatient Hospital Stay: Payer: Medicare Other | Admitting: Dietician

## 2023-10-26 ENCOUNTER — Ambulatory Visit: Payer: Medicare Other

## 2023-10-26 NOTE — Progress Notes (Signed)
 [facility] OFFICE PROGRESS NOTE  [name], MD [address] [place] [address]  DIAGNOSIS: Limited stage small cell lung cancer (T1c, N2, M0) small cell lung cancer. The patient presented with a hilar mass and right paratracheal lymphadenopathy. She was diagnosed in October 2024   PRIOR THERAPY: None  CURRENT THERAPY: Systemic chemotherapy with cisplatin  on day 1 and etoposide  100 mg/m on days 1, 2, and 3 IV every 3 weeks.  This will be with concurrent radiation.  First dose on 08/08/2023. Status post 3 cycles. Her dose of chemotherapy was slightly reduced to cisplatin  60 mg/m2 and etoposide  90 mg/m2.  Starting from cycle #4, we will reduce her etoposide  to 80 mg/m2   INTERVAL HISTORY: Shavell A Khalsa 68 y.o. female returns to the clinic today for a follow-up visit accompanied by her daughter.   In summary, the patient was recently diagnosed with limited stage small cell lung cancer. She tolerates her treatment well physically but she does have significant cytopenias with treatment that have been progressively getting worsen despite some dose reductions after cycle #3. With her last cycle of treatment, she had required GCSF injections x6, a blood transfusion, and 2 platelet transfusions.   She was seen by myself last week for repeat labs before considering cycle #4. However, she continued to have thrombocytopenia and her labs were not within parameters for treatment. Therefore, her treatment was delayed by 1 week. Dr. Sherrod will reduced her etoposide  further as well. She was given medrol  dosepack to improve her platelets faster.   Her main concern last week was related to continued dysphagia/odynophagia since undergoing radiation. She was compliant with her maalox and carafate . Therefore, radiation oncolog added diflucan  and aqueous vitamin E .  She has been taking Diflucan .  The aqueous vitamin E  was too expensive so she was not able to pick that up.   She also took the steroids for her thrombocytopenia.  Overall she is feeling pretty good today.  Her odynophagia and dysphagia is a whole lot better.  She is able to keep food down at this time.  She gained 2 pounds.  She was also hypotensive at her appointment last week.  Her blood pressure is improved at this time.    He denies any fever, chills, or night sweats.  She reports her cough is better.  She denies any abnormal bleeding.  Denies any upper respiratory infection, urinary changes, diarrhea, or abdominal pain.  She denies any unusual shortness of breath with exertion.  She denies any headache or visual changes.  She has some baseline vision issues but this is unchanged from her baseline.  Denies  any chest pain or hemoptysis.  She denies any constipation.    She is here for evaluation and to recheck her labs before considering starting cycle #4.  She is scheduled for a posttreatment call with radiation oncology today.  MEDICAL HISTORY: Past Medical History:  Diagnosis Date   Allergy    Anemia    Arthritis    Asthma    Blood transfusion without reported diagnosis    Cataract    COPD (chronic obstructive pulmonary disease) (HCC)    Duodenal ulcer    Emphysema of lung (HCC)    Family history of malignant neoplasm of gastrointestinal tract    GERD (gastroesophageal reflux disease)    Hepatitis    had in 6th grade pt unsure which kind   Hiatal hernia    HTN (hypertension) 10/03/2014   Hx of  colonic polyps    Hyperlipemia    Internal hemorrhoids    Macular degeneration    Osteoporosis    Peripheral vascular disease (HCC)    Pneumonia    hx of   Shortness of breath dyspnea    with ambulation   Stroke (HCC)    Vitamin D  deficiency     ALLERGIES:  is allergic to decadron  [dexamethasone ], celebrex [celecoxib], crestor [rosuvastatin], doxycycline , penicillins, pravastatin, and hydrocodone -acetaminophen .  MEDICATIONS:  Current Outpatient Medications  Medication Sig Dispense  Refill   albuterol  (PROVENTIL ) (2.5 MG/3ML) 0.083% nebulizer solution Take 3 mLs (2.5 mg total) by nebulization every 6 (six) hours as needed for wheezing or shortness of breath. 75 mL 12   albuterol  (VENTOLIN  HFA) 108 (90 Base) MCG/ACT inhaler 2 Inhalations 15 to 20  minutes apart every 4 hours to Rescue Asthma 18 g 3   Ascorbic Acid (VITAMIN C) 1000 MG tablet Take 1,000 mg by mouth daily. Takes 7,000mg      aspirin  EC 81 MG tablet Take 1 tablet (81 mg total) by mouth daily. 30 tablet 0   Cholecalciferol  (VITAMIN D ) 50 MCG (2000 UT) CAPS Take by mouth. Takes 6,000 units     ezetimibe  (ZETIA ) 10 MG tablet Take 1 tablet (10 mg total) by mouth daily. 30 tablet 11   FeFum-FePoly-FA-B Cmp-C-Biot (INTEGRA PLUS ) CAPS Take 1 capsule by mouth every morning. 30 capsule 2   fluconazole  (DIFLUCAN ) 100 MG tablet Take 1 tablet (100 mg total) by mouth daily. 14 tablet 0   fluticasone -salmeterol (ADVAIR DISKUS) 250-50 MCG/ACT AEPB Inhale 1 puff into the lungs in the morning and at bedtime. 60 each 1   ipratropium-albuterol  (DUONEB) 0.5-2.5 (3) MG/3ML SOLN Take 3 mLs by nebulization every 4 (four) hours as needed. Max:6 doses per day 540 mL 0   iron  polysaccharides (NIFEREX) 150 MG capsule Take 1 capsule (150 mg total) by mouth daily. 30 capsule 2   IRON , FERROUS SULFATE , PO Take 65 mg by mouth 2 (two) times daily.     levofloxacin  (LEVAQUIN ) 500 MG tablet Take 1 tablet (500 mg total) by mouth daily. 7 tablet 0   magic mouthwash (nystatin , diphenhydrAMINE , alum & mag hydroxide) suspension mixture Take 5 mLs by mouth 3 (three) times daily as needed for mouth pain. 240 mL 0   magnesium  oxide (MAG-OX) 400 (240 Mg) MG tablet Take 1 tablet (400 mg total) by mouth daily. 30 tablet 0   methylPREDNISolone  (MEDROL  DOSEPAK) 4 MG TBPK tablet Use as instructed 21 tablet 0   nystatin  (MYCOSTATIN ) 100000 UNIT/ML suspension Take 5 mLs (500,000 Units total) by mouth 4 (four) times daily. 60 mL 0   omeprazole  (PRILOSEC) 20 MG  capsule TAKE 1 CAPSULE BY MOUTH TWICE DAILY TO PREVENT INDIGESTION & HEARTBURN 180 capsule 1   ondansetron  (ZOFRAN ) 8 MG tablet TAKE 1 TABLET (8 MG TOTAL) BY MOUTH EVERY 8 (EIGHT) HOURS AS NEEDED FOR NAUSEA OR VOMITING. STARTING 3 DAYS AFTER CHEMOTHERAPY 30 tablet 1   prochlorperazine  (COMPAZINE ) 10 MG tablet Take 1 tablet (10 mg total) by mouth every 6 (six) hours as needed. 30 tablet 2   sucralfate  (CARAFATE ) 1 g tablet Take 1 tablet (1 g total) by mouth 4 (four) times daily. Dissolve each tablet in 15 cc water before use. 120 tablet 2   vitamin E  (AQUEOUS VITAMIN E ) 6.75 MG/0.3ML oral solution Take 2 mLs (100 Units total) by mouth daily. 60 mL 1   zinc gluconate 50 MG tablet Take 50 mg by mouth daily.  No current facility-administered medications for this visit.    SURGICAL HISTORY:  Past Surgical History:  Procedure Laterality Date   ABDOMINAL HYSTERECTOMY     APPENDECTOMY     CATARACT EXTRACTION, BILATERAL     COLONOSCOPY     ELBOW SURGERY Right    ENDARTERECTOMY Right 12/16/2014   Procedure: ENDARTERECTOMY CAROTID with Dacron patch;  Surgeon: Krystal JULIANNA Doing, MD;  Location: [facility] OR;  Service: Vascular;  Laterality: Right;   ESOPHAGOGASTRODUODENOSCOPY  10/2008   FINE NEEDLE ASPIRATION BIOPSY  07/25/2023   Procedure: FINE NEEDLE ASPIRATION BIOPSY;  Surgeon: Shelah Lamar RAMAN, MD;  Location: [facility]COPY;  Service: Pulmonary;;   HAND SURGERY Left    LUMBAR DISC SURGERY     TOTAL HIP ARTHROPLASTY Right    trigeminal neuralgia surgery     UPPER GASTROINTESTINAL ENDOSCOPY     VIDEO BRONCHOSCOPY WITH ENDOBRONCHIAL ULTRASOUND N/A 07/25/2023   Procedure: VIDEO BRONCHOSCOPY WITH ENDOBRONCHIAL ULTRASOUND;  Surgeon: Shelah Lamar RAMAN, MD;  Location: [facility];  Service: Pulmonary;  Laterality: N/A;   WRIST SURGERY Left     REVIEW OF SYSTEMS:   Review of Systems  Constitutional: Negative for appetite change, chills, fatigue, fever and unexpected weight change.  HENT: Improving dysphagia and  odynophagia. Negative for mouth sores, nosebleeds, sore throat.   Eyes: Negative for eye problems and icterus.  Respiratory: Improving cough. Negative for hemoptysis, shortness of breath and wheezing.   Cardiovascular: Negative for chest pain and leg swelling.  Gastrointestinal: Negative for abdominal pain, constipation, diarrhea, nausea and vomiting.  Genitourinary: Negative for bladder incontinence, difficulty urinating, dysuria, frequency and hematuria.   Musculoskeletal: Negative for back pain, gait problem, neck pain and neck stiffness.  Skin: Negative for itching and rash.  Neurological: Negative for dizziness, extremity weakness, gait problem, headaches, light-headedness and seizures.  Hematological: Negative for adenopathy. Does not bruise/bleed easily.  Psychiatric/Behavioral: Negative for confusion, depression and sleep disturbance. The patient is not nervous/anxious.     PHYSICAL EXAMINATION:  Blood pressure 123/65, pulse (!) 102, temperature (!) 97.3 F (36.3 C), temperature source Temporal, resp. rate 17, weight 80 lb 8 oz (36.5 kg), SpO2 97%.  ECOG PERFORMANCE STATUS: 1  Physical Exam  Constitutional: Oriented to person, place, and time and thin appearing female, and in no distress.  HENT:  Head: Normocephalic and atraumatic.  Mouth/Throat: Oropharynx is clear and moist. No oropharyngeal exudate.  Eyes: Conjunctivae are normal. Right eye exhibits no discharge. Left eye exhibits no discharge. No scleral icterus.  Neck: Normal range of motion. Neck supple.  Cardiovascular: Normal rate, regular rhythm, normal heart sounds and intact distal pulses.   Pulmonary/Chest: Effort normal and breath sounds normal. No respiratory distress. No wheezes. No rales.  Abdominal: Soft. Bowel sounds are normal. Exhibits no distension and no mass. There is no tenderness.  Musculoskeletal: Normal range of motion. Exhibits no edema.  Lymphadenopathy:    No cervical adenopathy.  Neurological:  Alert and oriented to person, place, and time. Exhibits muscle wasting. Gait normal. Coordination normal.  Skin: Skin is warm and dry. No rash noted. Not diaphoretic. No erythema. No pallor.  Psychiatric: Mood, memory and judgment normal.  Vitals reviewed.  LABORATORY DATA: Lab Results  Component Value Date   WBC 4.0 10/31/2023   HGB 9.0 (L) 10/31/2023   HCT 27.6 (L) 10/31/2023   MCV 105.3 (H) 10/31/2023   PLT 136 (L) 10/31/2023      Chemistry      Component Value Date/Time   NA 140 10/24/2023 0735  NA 141 09/02/2016 1147   K 4.7 10/24/2023 0735   K 4.5 09/02/2016 1147   CL 105 10/24/2023 0735   CO2 26 10/24/2023 0735   CO2 20 (L) 09/02/2016 1147   BUN 10 10/24/2023 0735   BUN 13.1 09/02/2016 1147   CREATININE 0.76 10/24/2023 0735   CREATININE 0.70 05/11/2023 0930   CREATININE 0.8 09/02/2016 1147      Component Value Date/Time   CALCIUM  9.1 10/24/2023 0735   CALCIUM  9.7 09/02/2016 1147   ALKPHOS 109 10/24/2023 0735   ALKPHOS 77 09/02/2016 1147   AST 8 (L) 10/24/2023 0735   AST 16 09/02/2016 1147   ALT 5 10/24/2023 0735   ALT 9 09/02/2016 1147   BILITOT 0.3 10/24/2023 0735   BILITOT <0.22 09/02/2016 1147       RADIOGRAPHIC STUDIES:  No results found.   ASSESSMENT/PLAN:  This is a very pleasant 68 year old Caucasian female with newly diagnosed limited stage small cell lung cancer (T1c, N2, M0) small cell lung cancer.  The patient presented with a hilar mass and right paratracheal lymphadenopathy.  She was diagnosed in October 2024.    She is currently undergoing radiation and chemotherapy with cisplatin  on day 1 and etoposide  100 mg/m on days 1, 2, and 3 IV every 3 weeks.  She underwent her first cycle of treatment on 08/08/2023. She is status post 3 cycles. Her dose of cisplatin  was reduced to 60 mg/m2 and etoposide  90 mg/m2 with cycle #3. Her dose of etoposide  was further reduced to 80 mg/m2 with cycle #4.     She also had significant thrombocytopenia in the  interval and required two platelet transfusions.    I reviewed her labs with Dr. Sherrod.  Her platelet count is 136k today. Her Hbg is 9.0 and WBC 4.0 and ANC 2.3.  She will proceed with treatment today as scheduled.   Dr. Sherrod will keep her dose reductions with etoposide  to 80 mg/m per Dr. Sherrod and cisplatin  60 mg/m2.   We will need to monitor her labs closely on a weekly basis. We will arrange GCSF if ANC <0.5, blood transfusion if Hbg <8, and platelets if platelets<10-20k or bleeding. Reviewed bleeding precautions. They know to call worsening symptoms and if we need to bring back sooner for evaluation.  I encouraged the patient and her daughter to stay in the lobby until her CBC results on her weekly lab appointment days.  I just asked that they notify staff member that they are going to wait for the results.  Therefore, she needs any supportive care, she will already be in the clinic and we can see if we could arrange for this to be performed while she is here.   I will arrange for a restaging CT scan to assess treatment response.    She has a follow up later today with radiation oncology via telephone. Her swallowing is significantly improved at this time.   She will receive additional magnesium  in her IV fluids today for a total of 4 g.   The patient was advised to call immediately if she has any concerning symptoms in the interval. The patient voices understanding of current disease status and treatment options and is in agreement with the current care plan. All questions were answered. The patient knows to call the clinic with any problems, questions or concerns. We can certainly see the patient much sooner if necessary    Orders Placed This Encounter  Procedures   CT Chest W Contrast  Standing Status:   Future    Expected Date:   11/14/2023    Expiration Date:   10/30/2024    If indicated for the ordered procedure, I authorize the administration of contrast media per  Radiology protocol:   Yes    Does the patient have a contrast media/X-ray dye allergy?:   No    Preferred imaging location?:   [facility]     The total time spent in the appointment was 30-39 minutes minutes  Larena Ohnemus L Rayen Dafoe, PA-C 10/31/23

## 2023-10-28 MED FILL — Fosaprepitant Dimeglumine For IV Infusion 150 MG (Base Eq): INTRAVENOUS | Qty: 5 | Status: AC

## 2023-10-31 ENCOUNTER — Ambulatory Visit
Admission: RE | Admit: 2023-10-31 | Discharge: 2023-10-31 | Disposition: A | Discharge status: Home / Self Care | Payer: Medicare Other | Source: Ambulatory Visit | Attending: Physician Assistant | Admitting: Physician Assistant

## 2023-10-31 ENCOUNTER — Other Ambulatory Visit: Payer: Medicare Other

## 2023-10-31 ENCOUNTER — Inpatient Hospital Stay: Payer: Medicare Other

## 2023-10-31 ENCOUNTER — Inpatient Hospital Stay: Payer: Medicare Other | Admitting: Physician Assistant

## 2023-10-31 ENCOUNTER — Inpatient Hospital Stay: Payer: Medicare Other | Admitting: Nutrition

## 2023-10-31 VITALS — BP 124/64 | HR 96 | Temp 98.0°F

## 2023-10-31 VITALS — BP 123/65 | HR 102 | Temp 97.3°F | Resp 17 | Wt 80.5 lb

## 2023-10-31 DIAGNOSIS — D508 Other iron deficiency anemias: Secondary | ICD-10-CM

## 2023-10-31 DIAGNOSIS — T451X5A Adverse effect of antineoplastic and immunosuppressive drugs, initial encounter: Secondary | ICD-10-CM | POA: Diagnosis not present

## 2023-10-31 DIAGNOSIS — R1319 Other dysphagia: Secondary | ICD-10-CM | POA: Diagnosis not present

## 2023-10-31 DIAGNOSIS — D701 Agranulocytosis secondary to cancer chemotherapy: Secondary | ICD-10-CM

## 2023-10-31 DIAGNOSIS — Z87891 Personal history of nicotine dependence: Secondary | ICD-10-CM | POA: Diagnosis not present

## 2023-10-31 DIAGNOSIS — Z5111 Encounter for antineoplastic chemotherapy: Secondary | ICD-10-CM | POA: Insufficient documentation

## 2023-10-31 DIAGNOSIS — D696 Thrombocytopenia, unspecified: Secondary | ICD-10-CM | POA: Diagnosis not present

## 2023-10-31 DIAGNOSIS — C3401 Malignant neoplasm of right main bronchus: Secondary | ICD-10-CM | POA: Diagnosis not present

## 2023-10-31 DIAGNOSIS — Z5189 Encounter for other specified aftercare: Secondary | ICD-10-CM | POA: Diagnosis not present

## 2023-10-31 DIAGNOSIS — Z79899 Other long term (current) drug therapy: Secondary | ICD-10-CM | POA: Diagnosis not present

## 2023-10-31 LAB — CMP (CANCER CENTER ONLY)
ALT: 5 U/L (ref 0–44)
AST: 10 U/L — ABNORMAL LOW (ref 15–41)
Albumin: 3.4 g/dL — ABNORMAL LOW (ref 3.5–5.0)
Alkaline Phosphatase: 77 U/L (ref 38–126)
Anion gap: 7 (ref 5–15)
BUN: 16 mg/dL (ref 8–23)
CO2: 30 mmol/L (ref 22–32)
Calcium: 9.1 mg/dL (ref 8.9–10.3)
Chloride: 104 mmol/L (ref 98–111)
Creatinine: 0.62 mg/dL (ref 0.44–1.00)
GFR, Estimated: >60 mL/min (ref >60)
Glucose, Bld: 91 mg/dL (ref 70–99)
Potassium: 4.2 mmol/L (ref 3.5–5.1)
Sodium: 141 mmol/L (ref 135–145)
Total Bilirubin: 0.2 mg/dL (ref 0.0–1.2)
Total Protein: 6.1 g/dL — ABNORMAL LOW (ref 6.5–8.1)

## 2023-10-31 LAB — CBC WITH DIFFERENTIAL (CANCER CENTER ONLY)
Abs Immature Granulocytes: 0.04 10*3/uL (ref 0.00–0.07)
Basophils Absolute: 0 10*3/uL (ref 0.0–0.1)
Basophils Relative: 1 %
Eosinophils Absolute: 0 10*3/uL (ref 0.0–0.5)
Eosinophils Relative: 1 %
HCT: 27.6 % — ABNORMAL LOW (ref 36.0–46.0)
Hemoglobin: 9 g/dL — ABNORMAL LOW (ref 12.0–15.0)
Immature Granulocytes: 1 %
Lymphocytes Relative: 25 %
Lymphs Abs: 1 10*3/uL (ref 0.7–4.0)
MCH: 34.4 pg — ABNORMAL HIGH (ref 26.0–34.0)
MCHC: 32.6 g/dL (ref 30.0–36.0)
MCV: 105.3 fL — ABNORMAL HIGH (ref 80.0–100.0)
Monocytes Absolute: 0.6 10*3/uL (ref 0.1–1.0)
Monocytes Relative: 16 %
Neutro Abs: 2.3 10*3/uL (ref 1.7–7.7)
Neutrophils Relative %: 56 %
Platelet Count: 136 10*3/uL — ABNORMAL LOW (ref 150–400)
RBC: 2.62 MIL/uL — ABNORMAL LOW (ref 3.87–5.11)
RDW: 18.2 % — ABNORMAL HIGH (ref 11.5–15.5)
WBC Count: 4 10*3/uL (ref 4.0–10.5)
nRBC: 0.5 % — ABNORMAL HIGH (ref 0.0–0.2)

## 2023-10-31 LAB — SAMPLE TO BLOOD BANK

## 2023-10-31 LAB — MAGNESIUM: Magnesium: 1.5 mg/dL — ABNORMAL LOW (ref 1.7–2.4)

## 2023-10-31 MED ORDER — POTASSIUM CHLORIDE IN NACL 20-0.9 MEQ/L-% IV SOLN
Freq: Once | INTRAVENOUS | Status: AC
Start: 2023-10-31 — End: 2023-10-31
  Filled 2023-10-31: qty 1000

## 2023-10-31 MED ORDER — MAGNESIUM SULFATE 2 GM/50ML IV SOLN
2.0000 g | Freq: Once | INTRAVENOUS | Status: AC
  Administered 2023-10-31: 2 g via INTRAVENOUS
  Filled 2023-10-31: qty 50

## 2023-10-31 MED ORDER — SODIUM CHLORIDE 0.9 % IV SOLN
150.0000 mg | Freq: Once | INTRAVENOUS | Status: AC
  Administered 2023-10-31: 150 mg via INTRAVENOUS
  Filled 2023-10-31: qty 5
  Filled 2023-10-31: qty 150

## 2023-10-31 MED ORDER — PALONOSETRON HCL INJECTION 0.25 MG/5ML
0.2500 mg | Freq: Once | INTRAVENOUS | Status: AC
Start: 2023-10-31 — End: 2023-10-31
  Administered 2023-10-31: 0.25 mg via INTRAVENOUS
  Filled 2023-10-31: qty 5

## 2023-10-31 MED ORDER — ETOPOSIDE CHEMO INJECTION 1 GM/50ML
80.0000 mg/m2 | Freq: Once | INTRAVENOUS | Status: AC
  Administered 2023-10-31: 100 mg via INTRAVENOUS
  Filled 2023-10-31: qty 5

## 2023-10-31 MED ORDER — DEXAMETHASONE SODIUM PHOSPHATE 10 MG/ML IJ SOLN
10.0000 mg | Freq: Once | INTRAMUSCULAR | Status: AC
Start: 2023-10-31 — End: 2023-10-31
  Administered 2023-10-31: 10 mg via INTRAVENOUS
  Filled 2023-10-31: qty 1

## 2023-10-31 MED ORDER — SODIUM CHLORIDE 0.9 % IV SOLN
60.0000 mg/m2 | Freq: Once | INTRAVENOUS | Status: AC
  Administered 2023-10-31: 79 mg via INTRAVENOUS
  Filled 2023-10-31: qty 79

## 2023-10-31 MED ORDER — MAGNESIUM SULFATE 2 GM/50ML IV SOLN
2.0000 g | Freq: Once | INTRAVENOUS | Status: AC
Start: 2023-10-31 — End: 2023-10-31
  Administered 2023-10-31: 2 g via INTRAVENOUS
  Filled 2023-10-31: qty 50

## 2023-10-31 MED ORDER — SODIUM CHLORIDE 0.9 % IV SOLN
INTRAVENOUS | Status: DC
Start: 2023-10-31 — End: 2023-10-31

## 2023-10-31 NOTE — Progress Notes (Signed)
 Nutrition follow-up completed with patient during infusion for limited stage small cell lung cancer.  Patient has had dose reduction.  She is followed by Dr. Sherrod and Dr. Dewey.  Weight increased slightly to 80 pounds 8 ounces on January 13 from 79 pounds 1.6 ounces January 6.  Noted labs: BUN 3.4 magnesium  1.5.  Patient reports she is eating better.  Reports improved dysphagia and odynophagia.  She continues to need to chew foods well and eat small amounts.  She is pleased with weight gain.  Denies nausea, vomiting, constipation, and diarrhea.  Continues to drink Premier protein but has also added Ensure Plus or equivalent 2 times a day.  States she did not care for [employer] essentials.  Denies questions or concerns.  Nutrition diagnosis: Inadequate oral intake improved  Intervention: Educated patient to continue strategies for increasing calories and protein in small amounts throughout the day. Reviewed importance of chewing food well and adding moisture to dry foods or liquids after bites of food. Continue Ensure Plus equivalent 2 times a day.  Provided coupons.  Monitoring, evaluation, goals: Patient will tolerate increased calories and protein to minimize weight loss and promote slow weight gain.  Next visit: To be scheduled as needed with upcoming treatment.

## 2023-10-31 NOTE — Progress Notes (Signed)
 OK to treat today with Mg 1.5 per Cassie Heilingoetter, PA.  Pt will receive an additional 2g IV Mg (total 4g) during Tx.

## 2023-10-31 NOTE — Progress Notes (Signed)
  Radiation Oncology         [phone]) [phone] ________________________________  Name: [name] MRN: [id number]  Date of Service: 10/31/2023  DOB: 15-Oct-1956  Post Treatment Telephone Note  Diagnosis:  Limited Stage Small Cell Carcinoma of the right hilar lung. (as documented in provider EOT note)  The patient was not available for call today.   The patient has scheduled follow up with [name] for ongoing care, and was encouraged to call if she develops concerns or questions regarding radiation.    [name], LPN

## 2023-10-31 NOTE — Patient Instructions (Signed)
 [facility] - A DEPT OF [facility]  Discharge Instructions: Thank you for choosing [facility] to provide your oncology and hematology care.   If you have a lab appointment with the Cancer Center, please go directly to the Cancer Center and check in at the registration area.   Wear comfortable clothing and clothing appropriate for easy access to any Portacath or PICC line.   We strive to give you quality time with your provider. You may need to reschedule your appointment if you arrive late (15 or more minutes).  Arriving late affects you and other patients whose appointments are after yours.  Also, if you miss three or more appointments without notifying the office, you may be dismissed from the clinic at the provider's discretion.      For prescription refill requests, have your pharmacy contact our office and allow 72 hours for refills to be completed.    Today you received the following chemotherapy and/or immunotherapy agents: Cisplatin , Gemcitabine      To help prevent nausea and vomiting after your treatment, we encourage you to take your nausea medication as directed.  BELOW ARE SYMPTOMS THAT SHOULD BE REPORTED IMMEDIATELY: *FEVER GREATER THAN 100.4 F (38 C) OR HIGHER *CHILLS OR SWEATING *NAUSEA AND VOMITING THAT IS NOT CONTROLLED WITH YOUR NAUSEA MEDICATION *UNUSUAL SHORTNESS OF BREATH *UNUSUAL BRUISING OR BLEEDING *URINARY PROBLEMS (pain or burning when urinating, or frequent urination) *BOWEL PROBLEMS (unusual diarrhea, constipation, pain near the anus) TENDERNESS IN MOUTH AND THROAT WITH OR WITHOUT PRESENCE OF ULCERS (sore throat, sores in mouth, or a toothache) UNUSUAL RASH, SWELLING OR PAIN  UNUSUAL VAGINAL DISCHARGE OR ITCHING   Items with * indicate a potential emergency and should be followed up as soon as possible or go to the Emergency Department if any problems should occur.  Please show the CHEMOTHERAPY ALERT CARD or  IMMUNOTHERAPY ALERT CARD at check-in to the Emergency Department and triage nurse.  Should you have questions after your visit or need to cancel or reschedule your appointment, please contact [facility] - A DEPT OF [facility]  Dept: [phone]  and follow the prompts.  Office hours are 8:00 a.m. to 4:30 p.m. Monday - Friday. Please note that voicemails left after 4:00 p.m. may not be returned until the following business day.  We are closed weekends and major holidays. You have access to a nurse at all times for urgent questions. Please call the main number to the clinic Dept: [phone] and follow the prompts.   For any non-urgent questions, you may also contact your provider using MyChart. We now offer e-Visits for anyone 60 and older to request care online for non-urgent symptoms. For details visit [email or web address].   Also download the MyChart app! Go to the app store, search MyChart, open the app, select [facility], and log in with your MyChart username and password.  Hypomagnesemia Hypomagnesemia is a condition in which the level of magnesium  in the blood is too low. Magnesium  is a mineral that is found in many foods. It is used in many different processes in the body. Hypomagnesemia can affect every organ in the body. In severe cases, it can cause life-threatening problems. What are the causes? This condition may be caused by: Not getting enough magnesium  in your diet or not having enough healthy foods to eat (malnutrition). Problems with magnesium  absorption in the intestines. Dehydration. Excessive use of alcohol.  Vomiting. Severe or long-term (chronic) diarrhea. Some medicines, including medicines that make you urinate more often (diuretics). Certain diseases, such as kidney disease, diabetes, celiac disease, and overactive thyroid . What are the signs or symptoms? Symptoms of this condition include: Loss of appetite, nausea, and  vomiting. Involuntary shaking or trembling of a body part (tremor). Muscle weakness or tingling in the arms and legs. Sudden tightening of muscles (muscle spasms). Confusion. Psychiatric issues, such as: Depression and irritability. Psychosis. A feeling of fluttering of the heart (palpitations). Seizures. These symptoms are more severe if magnesium  levels drop suddenly. How is this diagnosed? This condition may be diagnosed based on: Your symptoms and medical history. A physical exam. Blood and urine tests. How is this treated? Treatment depends on the cause and the severity of the condition. It may be treated by: Taking a magnesium  supplement. This can be taken in pill form. If the condition is severe, magnesium  is usually given through an IV. Making changes to your diet. You may be directed to eat foods that have a lot of magnesium , such as green leafy vegetables, peas, beans, and nuts. Not drinking alcohol. If you are struggling not to drink, ask your health care provider for help. Follow these instructions at home: Eating and drinking     Make sure that your diet includes foods with magnesium . Foods that have a lot of magnesium  in them include: Green leafy vegetables, such as spinach and broccoli. Beans and peas. Nuts and seeds, such as almonds and sunflower seeds. Whole grains, such as whole grain bread and fortified cereals. Drink fluids that contain salts and minerals (electrolytes), such as sports drinks, when you are active. Do not drink alcohol. General instructions Take over-the-counter and prescription medicines only as told by your health care provider. Take magnesium  supplements as directed if your health care provider tells you to take them. Have your magnesium  levels monitored as told by your health care provider. Keep all follow-up visits. This is important. Contact a health care provider if: You get worse instead of better. Your symptoms return. Get help  right away if: You develop severe muscle weakness. You have trouble breathing. You feel that your heart is racing. These symptoms may represent a serious problem that is an emergency. Do not wait to see if the symptoms will go away. Get medical help right away. Call your local emergency services (911 in the [place] Do not drive yourself to the hospital. Summary Hypomagnesemia is a condition in which the level of magnesium  in the blood is too low. Hypomagnesemia can affect every organ in the body. Treatment may include eating more foods that contain magnesium , taking magnesium  supplements, and not drinking alcohol. Have your magnesium  levels monitored as told by your health care provider. This information is not intended to replace advice given to you by your health care provider. Make sure you discuss any questions you have with your health care provider. Document Revised: 03/03/2021 Document Reviewed: 03/03/2021 Elsevier Patient Education  2024 [employer].

## 2023-11-01 ENCOUNTER — Telehealth: Payer: Self-pay | Admitting: *Deleted

## 2023-11-01 ENCOUNTER — Inpatient Hospital Stay: Payer: Medicare Other

## 2023-11-01 VITALS — BP 137/72 | HR 98 | Temp 97.9°F | Wt 81.0 lb

## 2023-11-01 DIAGNOSIS — Z5111 Encounter for antineoplastic chemotherapy: Secondary | ICD-10-CM | POA: Diagnosis not present

## 2023-11-01 DIAGNOSIS — Z87891 Personal history of nicotine dependence: Secondary | ICD-10-CM | POA: Diagnosis not present

## 2023-11-01 DIAGNOSIS — C3401 Malignant neoplasm of right main bronchus: Secondary | ICD-10-CM | POA: Diagnosis not present

## 2023-11-01 DIAGNOSIS — R1319 Other dysphagia: Secondary | ICD-10-CM | POA: Diagnosis not present

## 2023-11-01 DIAGNOSIS — D696 Thrombocytopenia, unspecified: Secondary | ICD-10-CM | POA: Diagnosis not present

## 2023-11-01 DIAGNOSIS — Z5189 Encounter for other specified aftercare: Secondary | ICD-10-CM | POA: Diagnosis not present

## 2023-11-01 DIAGNOSIS — Z79899 Other long term (current) drug therapy: Secondary | ICD-10-CM | POA: Diagnosis not present

## 2023-11-01 MED ORDER — DEXAMETHASONE SODIUM PHOSPHATE 10 MG/ML IJ SOLN
10.0000 mg | Freq: Once | INTRAMUSCULAR | Status: AC
  Administered 2023-11-01: 10 mg via INTRAVENOUS
  Filled 2023-11-01: qty 1

## 2023-11-01 MED ORDER — SODIUM CHLORIDE 0.9 % IV SOLN
INTRAVENOUS | Status: DC
Start: 2023-11-01 — End: 2023-11-01

## 2023-11-01 MED ORDER — SODIUM CHLORIDE 0.9 % IV SOLN
80.0000 mg/m2 | Freq: Once | INTRAVENOUS | Status: AC
  Administered 2023-11-01: 100 mg via INTRAVENOUS
  Filled 2023-11-01: qty 5

## 2023-11-01 NOTE — Telephone Encounter (Signed)
 Spoke with the patient and her daughter to see how she was feeling since we started her on diflucan  and aqueous vitamin E .  She reports she was able to start the diflucan  but the vitamin E  was to expensive.  She was started on steroids by her medical oncologist as well.  She reports her throat is feeling much better, she is able to eat and drink and is able to keep it all down.  She continues the carafate  and maalox and using antiemetics as needed.  No other concerns voiced.  Encouraged to reach out with further swallowing changes.  Sharene EDISON RN, BSN

## 2023-11-02 ENCOUNTER — Inpatient Hospital Stay: Payer: Medicare Other

## 2023-11-02 VITALS — BP 147/73 | HR 95 | Temp 98.1°F | Resp 17

## 2023-11-02 DIAGNOSIS — D696 Thrombocytopenia, unspecified: Secondary | ICD-10-CM | POA: Diagnosis not present

## 2023-11-02 DIAGNOSIS — C3401 Malignant neoplasm of right main bronchus: Secondary | ICD-10-CM

## 2023-11-02 DIAGNOSIS — Z79899 Other long term (current) drug therapy: Secondary | ICD-10-CM | POA: Diagnosis not present

## 2023-11-02 DIAGNOSIS — Z5111 Encounter for antineoplastic chemotherapy: Secondary | ICD-10-CM | POA: Diagnosis not present

## 2023-11-02 DIAGNOSIS — Z87891 Personal history of nicotine dependence: Secondary | ICD-10-CM | POA: Diagnosis not present

## 2023-11-02 DIAGNOSIS — Z5189 Encounter for other specified aftercare: Secondary | ICD-10-CM | POA: Diagnosis not present

## 2023-11-02 DIAGNOSIS — R1319 Other dysphagia: Secondary | ICD-10-CM | POA: Diagnosis not present

## 2023-11-02 MED ORDER — SODIUM CHLORIDE 0.9 % IV SOLN: INTRAVENOUS | Status: DC

## 2023-11-02 MED ORDER — ETOPOSIDE CHEMO INJECTION 1 GM/50ML
80.0000 mg/m2 | Freq: Once | INTRAVENOUS | Status: AC
  Administered 2023-11-02: 100 mg via INTRAVENOUS
  Filled 2023-11-02: qty 5

## 2023-11-02 MED ORDER — DEXAMETHASONE SODIUM PHOSPHATE 10 MG/ML IJ SOLN
10.0000 mg | Freq: Once | INTRAMUSCULAR | Status: AC
  Administered 2023-11-02: 10 mg via INTRAVENOUS
  Filled 2023-11-02: qty 1

## 2023-11-02 NOTE — Patient Instructions (Signed)
 [facility] - A DEPT OF [facility]  Discharge Instructions: Thank you for choosing [facility] to provide your oncology and hematology care.   If you have a lab appointment with the Cancer Center, please go directly to the Cancer Center and check in at the registration area.   Wear comfortable clothing and clothing appropriate for easy access to any Portacath or PICC line.   We strive to give you quality time with your provider. You may need to reschedule your appointment if you arrive late (15 or more minutes).  Arriving late affects you and other patients whose appointments are after yours.  Also, if you miss three or more appointments without notifying the office, you may be dismissed from the clinic at the provider's discretion.      For prescription refill requests, have your pharmacy contact our office and allow 72 hours for refills to be completed.    Today you received the following chemotherapy and/or immunotherapy agents: Etoposide      To help prevent nausea and vomiting after your treatment, we encourage you to take your nausea medication as directed.  BELOW ARE SYMPTOMS THAT SHOULD BE REPORTED IMMEDIATELY: *FEVER GREATER THAN 100.4 F (38 C) OR HIGHER *CHILLS OR SWEATING *NAUSEA AND VOMITING THAT IS NOT CONTROLLED WITH YOUR NAUSEA MEDICATION *UNUSUAL SHORTNESS OF BREATH *UNUSUAL BRUISING OR BLEEDING *URINARY PROBLEMS (pain or burning when urinating, or frequent urination) *BOWEL PROBLEMS (unusual diarrhea, constipation, pain near the anus) TENDERNESS IN MOUTH AND THROAT WITH OR WITHOUT PRESENCE OF ULCERS (sore throat, sores in mouth, or a toothache) UNUSUAL RASH, SWELLING OR PAIN  UNUSUAL VAGINAL DISCHARGE OR ITCHING   Items with * indicate a potential emergency and should be followed up as soon as possible or go to the Emergency Department if any problems should occur.  Please show the CHEMOTHERAPY ALERT CARD or IMMUNOTHERAPY  ALERT CARD at check-in to the Emergency Department and triage nurse.  Should you have questions after your visit or need to cancel or reschedule your appointment, please contact [facility] - [facility]  Dept: [phone]  and follow the prompts.  Office hours are 8:00 a.m. to 4:30 p.m. Monday - Friday. Please note that voicemails left after 4:00 p.m. may not be returned until the following business day.  We are closed weekends and major holidays. You have access to a nurse at all times for urgent questions. Please call the main number to the clinic Dept: [phone] and follow the prompts.   For any non-urgent questions, you may also contact your provider using MyChart. We now offer e-Visits for anyone 10 and older to request care online for non-urgent symptoms. For details visit [email or web address].   Also download the MyChart app! Go to the app store, search "MyChart", open the app, select [facility], and log in with your MyChart username and password.

## 2023-11-04 NOTE — Progress Notes (Unsigned)
Chief Complaint:dysphagia Primary GI Doctor: Dr. 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    Interval History  Recently had chemo radiation. Food getting stuck and cannot even get soft foods down despite carafate and maalox.   Patient admits/denies GERD Patient taking Omeprazole 20mg  po daily Patient taking sucralfate 1 g tablet  Patient admits/denies dysphagia Patient admits/denies nausea, vomiting, or weight loss  Patient admits/denies altered bowel habits Patient admits/denies abdominal pain Patient admits/denies rectal bleeding   Denies/Admits alcohol Denies/Admits smoking Denies/Admits NSAID use. Denies/Admits they are on blood thinners.  Patients last colonoscopy Patients last EGD  Patient's family history includes  Wt Readings from Last 3 Encounters:  11/01/23 81 lb (36.7 kg)  10/31/23 80 lb 8 oz (36.5 kg)  10/24/23 79 lb 6.4 oz (36 kg)      Past Medical History:  Diagnosis Date   Allergy    Anemia    Arthritis    Asthma    Blood transfusion without reported diagnosis    Cataract    COPD (chronic obstructive pulmonary disease) (HCC)    Duodenal ulcer    Emphysema of lung (HCC)    Family history of malignant neoplasm of gastrointestinal tract    GERD (gastroesophageal reflux disease)    Hepatitis    had in 6th grade pt unsure which kind   Hiatal hernia    HTN (hypertension) 10/03/2014   Hx of colonic polyps    Hyperlipemia    Internal hemorrhoids    Macular degeneration    Osteoporosis    Peripheral vascular disease (HCC)    Pneumonia    hx of   Shortness of breath dyspnea    with ambulation   Stroke (HCC)    Vitamin D deficiency     Past Surgical History:  Procedure Laterality Date   ABDOMINAL HYSTERECTOMY     APPENDECTOMY     CATARACT EXTRACTION,  BILATERAL     COLONOSCOPY     ELBOW SURGERY Right    ENDARTERECTOMY Right 12/16/2014   Procedure: ENDARTERECTOMY CAROTID with Dacron patch;  Surgeon: Larina Earthly, MD;  Location: [facility] OR;  Service: Vascular;  Laterality: Right;   ESOPHAGOGASTRODUODENOSCOPY  10/2008   FINE NEEDLE ASPIRATION BIOPSY  07/25/2023   Procedure: FINE NEEDLE ASPIRATION BIOPSY;  Surgeon: Leslye Peer, MD;  Location: [facility] ENDOSCOPY;  Service: Pulmonary;;   HAND SURGERY Left    LUMBAR DISC SURGERY     TOTAL HIP ARTHROPLASTY Right    trigeminal neuralgia surgery     UPPER GASTROINTESTINAL ENDOSCOPY     VIDEO BRONCHOSCOPY WITH ENDOBRONCHIAL ULTRASOUND N/A 07/25/2023   Procedure: VIDEO BRONCHOSCOPY WITH ENDOBRONCHIAL ULTRASOUND;  Surgeon: Leslye Peer, MD;  Location: [facility] ENDOSCOPY;  Service: Pulmonary;  Laterality: N/A;   WRIST SURGERY Left     Current Outpatient Medications  Medication Sig Dispense Refill   albuterol (PROVENTIL) (2.5 MG/3ML) 0.083% nebulizer solution Take 3 mLs (2.5 mg total) by nebulization every 6 (six) hours as needed for wheezing or shortness of breath. 75 mL 12   albuterol (VENTOLIN HFA) 108 (90 Base) MCG/ACT inhaler 2 Inhalations 15 to 20  minutes apart every 4 hours to Rescue Asthma 18 g 3   Ascorbic Acid (VITAMIN C) 1000 MG tablet Take 1,000 mg by mouth daily. Takes 7,000mg   aspirin EC 81 MG tablet Take 1 tablet (81 mg total) by mouth daily. 30 tablet 0   Cholecalciferol (VITAMIN D) 50 MCG (2000 UT) CAPS Take by mouth. Takes 6,000 units     ezetimibe (ZETIA) 10 MG tablet Take 1 tablet (10 mg total) by mouth daily. 30 tablet 11   FeFum-FePoly-FA-B Cmp-C-Biot (INTEGRA PLUS) CAPS Take 1 capsule by mouth every morning. 30 capsule 2   fluconazole (DIFLUCAN) 100 MG tablet Take 1 tablet (100 mg total) by mouth daily. 14 tablet 0   fluticasone-salmeterol (ADVAIR DISKUS) 250-50 MCG/ACT AEPB Inhale 1 puff into the lungs in the morning and at bedtime. 60 each 1   ipratropium-albuterol (DUONEB) 0.5-2.5  (3) MG/3ML SOLN Take 3 mLs by nebulization every 4 (four) hours as needed. Max:6 doses per day 540 mL 0   iron polysaccharides (NIFEREX) 150 MG capsule Take 1 capsule (150 mg total) by mouth daily. 30 capsule 2   IRON, FERROUS SULFATE, PO Take 65 mg by mouth 2 (two) times daily.     levofloxacin (LEVAQUIN) 500 MG tablet Take 1 tablet (500 mg total) by mouth daily. 7 tablet 0   magic mouthwash (nystatin, diphenhydrAMINE, alum & mag hydroxide) suspension mixture Take 5 mLs by mouth 3 (three) times daily as needed for mouth pain. 240 mL 0   magnesium oxide (MAG-OX) 400 (240 Mg) MG tablet Take 1 tablet (400 mg total) by mouth daily. 30 tablet 0   methylPREDNISolone (MEDROL DOSEPAK) 4 MG TBPK tablet Use as instructed 21 tablet 0   nystatin (MYCOSTATIN) 100000 UNIT/ML suspension Take 5 mLs (500,000 Units total) by mouth 4 (four) times daily. 60 mL 0   omeprazole (PRILOSEC) 20 MG capsule TAKE 1 CAPSULE BY MOUTH TWICE DAILY TO PREVENT INDIGESTION & HEARTBURN 180 capsule 1   ondansetron (ZOFRAN) 8 MG tablet TAKE 1 TABLET (8 MG TOTAL) BY MOUTH EVERY 8 (EIGHT) HOURS AS NEEDED FOR NAUSEA OR VOMITING. STARTING 3 DAYS AFTER CHEMOTHERAPY 30 tablet 1   prochlorperazine (COMPAZINE) 10 MG tablet Take 1 tablet (10 mg total) by mouth every 6 (six) hours as needed. 30 tablet 2   sucralfate (CARAFATE) 1 g tablet Take 1 tablet (1 g total) by mouth 4 (four) times daily. Dissolve each tablet in 15 cc water before use. 120 tablet 2   vitamin E (AQUEOUS VITAMIN E) 6.75 MG/0.3ML oral solution Take 2 mLs (100 Units total) by mouth daily. 60 mL 1   zinc gluconate 50 MG tablet Take 50 mg by mouth daily.     No current facility-administered medications for this visit.    Allergies as of 11/07/2023 - Review Complete 11/02/2023  Allergen Reaction Noted   Decadron [dexamethasone] Other (See Comments) 10/24/2019   Celebrex [celecoxib] Other (See Comments) 10/15/2013   Crestor [rosuvastatin] Other (See Comments) 12/21/2012    Doxycycline Other (See Comments) 03/18/2022   Penicillins Other (See Comments) 12/13/2008   Pravastatin Other (See Comments) 10/15/2013   Hydrocodone-acetaminophen Hives, Itching, and Rash 03/18/2022    Family History  Problem Relation Age of Onset   Hypertension Father    Hyperlipidemia Father    Heart disease Father    Heart attack Father    Colon polyps Mother    Inflammatory bowel disease Mother    Hypertension Mother    Stroke Mother    Heart disease Mother    Hyperlipidemia Mother    Varicose Veins Mother    Heart attack Mother    AAA (abdominal aortic aneurysm) Mother    Diabetes  Maternal Grandfather    Heart disease Maternal Grandfather    Heart disease Maternal Grandmother    Colon cancer Other        mat great aunt   Heart disease Brother    Hyperlipidemia Brother    Hypertension Brother    Heart attack Brother    AAA (abdominal aortic aneurysm) Brother    Varicose Veins Daughter    Esophageal cancer Neg Hx    Stomach cancer Neg Hx    Rectal cancer Neg Hx     Review of Systems:    Constitutional: No weight loss, fever, chills, weakness or fatigue HEENT: Eyes: No change in vision               Ears, Nose, Throat:  No change in hearing or congestion Skin: No rash or itching Cardiovascular: No chest pain, chest pressure or palpitations   Respiratory: No SOB or cough Gastrointestinal: See HPI and otherwise negative Genitourinary: No dysuria or change in urinary frequency Neurological: No headache, dizziness or syncope Musculoskeletal: No new muscle or joint pain Hematologic: No bleeding or bruising Psychiatric: No history of depression or anxiety    Physical Exam:  Vital signs: There were no vitals taken for this visit.  Constitutional:   Pleasant Caucasian female*** appears to be in NAD, Well developed, Well nourished, alert and cooperative Head:  Normocephalic and atraumatic. Eyes:   PEERL, EOMI. No icterus. Conjunctiva pink. Ears:  Normal auditory  acuity. Neck:  Supple Throat: Oral cavity and pharynx without inflammation, swelling or lesion.  Respiratory: Respirations even and unlabored. Lungs clear to auscultation bilaterally.   No wheezes, crackles, or rhonchi.  Cardiovascular: Normal S1, S2. Regular rate and rhythm. No peripheral edema, cyanosis or pallor.  Gastrointestinal:  Soft, nondistended, nontender. No rebound or guarding. Normal bowel sounds. No appreciable masses or hepatomegaly. Rectal:  Not performed.  Anoscopy: Msk:  Symmetrical without gross deformities. Without edema, no deformity or joint abnormality.  Neurologic:  Alert and  oriented x4;  grossly normal neurologically.  Skin:   Dry and intact without significant lesions or rashes. Psychiatric: Oriented to person, place and time. Demonstrates good judgement and reason without abnormal affect or behaviors.  RELEVANT LABS AND IMAGING: CBC    Latest Ref Rng & Units 10/31/2023    7:37 AM 10/24/2023    7:35 AM 10/20/2023   11:09 AM  CBC  WBC 4.0 - 10.5 K/uL 4.0  5.9  6.7   Hemoglobin 12.0 - 15.0 g/dL 9.0  8.9  8.5   Hematocrit 36.0 - 46.0 % 27.6  27.3  24.8   Platelets 150 - 400 K/uL 136  51  29      CMP     Latest Ref Rng & Units 10/31/2023    7:37 AM 10/24/2023    7:35 AM 10/20/2023   11:09 AM  CMP  Glucose 70 - 99 mg/dL 91  96  161   BUN 8 - 23 mg/dL 16  10  10    Creatinine 0.44 - 1.00 mg/dL 0.96  0.45  4.09   Sodium 135 - 145 mmol/L 141  140  138   Potassium 3.5 - 5.1 mmol/L 4.2  4.7  4.3   Chloride 98 - 111 mmol/L 104  105  106   CO2 22 - 32 mmol/L 30  26  28    Calcium 8.9 - 10.3 mg/dL 9.1  9.1  9.1   Total Protein 6.5 - 8.1 g/dL 6.1  6.6  6.2   Total  Bilirubin 0.0 - 1.2 mg/dL 0.2  0.3  0.2   Alkaline Phos 38 - 126 U/L 77  109  103   AST 15 - 41 U/L 10  8  7    ALT 0 - 44 U/L 5  5  <5      Lab Results  Component Value Date   TSH 1.18 06/09/2023    09/17/2017 Echo 07/08/16 capsule endoscopy 12/31/2015 EGD 12/31/15 colonoscopy   07/08/23 CT chest with  contrast IMPRESSION: 1. Nodal mass within the right hilum measures 2.7 x 2.0 cm. Findings are concerning for malignancy. Recommend further evaluation with PET-CT and tissue sampling. 2. Prominent right paratracheal lymph node measures 1.2 cm. 3. Unchanged right middle lobe lung nodule measuring 5 mm. 4. Emphysema and bronchial wall thickening. 5. Coronary artery calcifications. 6. Aortic Atherosclerosis (ICD10-I70.0) and Emphysema (ICD10-J43.9).    Assessment: 1. ***  Plan: 1. ***   Thank you for the courtesy of this consult. Please call me with any questions or concerns.   Eiman Maret, FNP-C [facility] 11/04/2023, 4:22 PM  Cc: Heilingoetter, Cassandr*

## 2023-11-07 ENCOUNTER — Other Ambulatory Visit: Payer: Self-pay | Admitting: Gastroenterology

## 2023-11-07 ENCOUNTER — Other Ambulatory Visit: Payer: Self-pay | Admitting: Nurse Practitioner

## 2023-11-07 ENCOUNTER — Inpatient Hospital Stay: Payer: Medicare Other

## 2023-11-07 ENCOUNTER — Encounter: Payer: Self-pay | Admitting: Gastroenterology

## 2023-11-07 ENCOUNTER — Ambulatory Visit: Payer: Medicare Other | Admitting: Gastroenterology

## 2023-11-07 ENCOUNTER — Other Ambulatory Visit: Payer: Self-pay

## 2023-11-07 VITALS — BP 118/64 | HR 105 | Ht 61.0 in | Wt 81.1 lb

## 2023-11-07 DIAGNOSIS — C3401 Malignant neoplasm of right main bronchus: Secondary | ICD-10-CM

## 2023-11-07 DIAGNOSIS — Z79899 Other long term (current) drug therapy: Secondary | ICD-10-CM | POA: Diagnosis not present

## 2023-11-07 DIAGNOSIS — Z5111 Encounter for antineoplastic chemotherapy: Secondary | ICD-10-CM | POA: Diagnosis not present

## 2023-11-07 DIAGNOSIS — Z5189 Encounter for other specified aftercare: Secondary | ICD-10-CM | POA: Diagnosis not present

## 2023-11-07 DIAGNOSIS — Z87891 Personal history of nicotine dependence: Secondary | ICD-10-CM | POA: Diagnosis not present

## 2023-11-07 DIAGNOSIS — R1319 Other dysphagia: Secondary | ICD-10-CM

## 2023-11-07 DIAGNOSIS — J449 Chronic obstructive pulmonary disease, unspecified: Secondary | ICD-10-CM | POA: Diagnosis not present

## 2023-11-07 DIAGNOSIS — D696 Thrombocytopenia, unspecified: Secondary | ICD-10-CM | POA: Diagnosis not present

## 2023-11-07 DIAGNOSIS — R1310 Dysphagia, unspecified: Secondary | ICD-10-CM

## 2023-11-07 DIAGNOSIS — K219 Gastro-esophageal reflux disease without esophagitis: Secondary | ICD-10-CM

## 2023-11-07 DIAGNOSIS — D508 Other iron deficiency anemias: Secondary | ICD-10-CM

## 2023-11-07 LAB — CBC WITH DIFFERENTIAL (CANCER CENTER ONLY)
Abs Immature Granulocytes: 0.06 10*3/uL (ref 0.00–0.07)
Basophils Absolute: 0 10*3/uL (ref 0.0–0.1)
Basophils Relative: 0 %
Eosinophils Absolute: 0.1 10*3/uL (ref 0.0–0.5)
Eosinophils Relative: 2 %
HCT: 24.7 % — ABNORMAL LOW (ref 36.0–46.0)
Hemoglobin: 8.1 g/dL — ABNORMAL LOW (ref 12.0–15.0)
Immature Granulocytes: 2 %
Lymphocytes Relative: 24 %
Lymphs Abs: 0.7 10*3/uL (ref 0.7–4.0)
MCH: 34.9 pg — ABNORMAL HIGH (ref 26.0–34.0)
MCHC: 32.8 g/dL (ref 30.0–36.0)
MCV: 106.5 fL — ABNORMAL HIGH (ref 80.0–100.0)
Monocytes Absolute: 0.1 10*3/uL (ref 0.1–1.0)
Monocytes Relative: 3 %
Neutro Abs: 1.9 10*3/uL (ref 1.7–7.7)
Neutrophils Relative %: 69 %
Platelet Count: 75 10*3/uL — ABNORMAL LOW (ref 150–400)
RBC: 2.32 MIL/uL — ABNORMAL LOW (ref 3.87–5.11)
RDW: 17.4 % — ABNORMAL HIGH (ref 11.5–15.5)
Smear Review: NORMAL
WBC Count: 2.8 10*3/uL — ABNORMAL LOW (ref 4.0–10.5)
nRBC: 0 % (ref 0.0–0.2)

## 2023-11-07 LAB — CMP (CANCER CENTER ONLY)
ALT: 6 U/L (ref 0–44)
AST: 11 U/L — ABNORMAL LOW (ref 15–41)
Albumin: 3.4 g/dL — ABNORMAL LOW (ref 3.5–5.0)
Alkaline Phosphatase: 76 U/L (ref 38–126)
Anion gap: 4 — ABNORMAL LOW (ref 5–15)
BUN: 14 mg/dL (ref 8–23)
CO2: 29 mmol/L (ref 22–32)
Calcium: 8.7 mg/dL — ABNORMAL LOW (ref 8.9–10.3)
Chloride: 103 mmol/L (ref 98–111)
Creatinine: 0.58 mg/dL (ref 0.44–1.00)
GFR, Estimated: >60 mL/min (ref >60)
Glucose, Bld: 89 mg/dL (ref 70–99)
Potassium: 4.4 mmol/L (ref 3.5–5.1)
Sodium: 136 mmol/L (ref 135–145)
Total Bilirubin: 0.2 mg/dL (ref 0.0–1.2)
Total Protein: 6 g/dL — ABNORMAL LOW (ref 6.5–8.1)

## 2023-11-07 LAB — SAMPLE TO BLOOD BANK

## 2023-11-07 MED ORDER — NYSTATIN 100000 UNIT/ML MT SUSP: 5.0000 mL | Freq: Four times a day (QID) | OROMUCOSAL | 1 refills | Status: DC | PRN

## 2023-11-07 MED ORDER — OMEPRAZOLE 40 MG PO CPDR: 40.0000 mg | DELAYED_RELEASE_CAPSULE | Freq: Two times a day (BID) | ORAL | 1 refills | Status: DC

## 2023-11-07 MED ORDER — AMBULATORY NON FORMULARY MEDICATION: 1 refills | Status: DC

## 2023-11-07 NOTE — Patient Instructions (Addendum)
Dietary modification (bland, pureed, or soft foods, soups) to help a patient maintain adequate caloric and liquid intake.   Eating more frequent, smaller meals and avoiding foods that are very hot or very cold may also be useful.   Avoidance of smoking, alcohol, coffee, spicy or acidic foods or liquids, chips, crackers, and fatty and indigestible foods can be helpful.   In addition, ingestion of hot or cold foods should be avoided if possible; instead, foods and liquids should be at room temperature.   We have sent the following medications to your pharmacy for you to pick up at your convenience: Dukes magic mouthwash and the increased dose of omeprazole.   _______________________________________________________  If your blood pressure at your visit was 140/90 or greater, please contact your primary care physician to follow up on this.  _______________________________________________________  If you are age 46 or older, your body mass index should be between 23-30. Your Body mass index is 15.33 kg/m. If this is out of the aforementioned range listed, please consider follow up with your Primary Care Provider.  If you are age 18 or younger, your body mass index should be between 19-25. Your Body mass index is 15.33 kg/m. If this is out of the aformentioned range listed, please consider follow up with your Primary Care Provider.   ________________________________________________________  The [facility] providers would like to encourage you to use [facility] to communicate with providers for non-urgent requests or questions.  Due to long hold times on the telephone, sending your provider a message by [facility] may be a faster and more efficient way to get a response.  Please allow 48 business hours for a response.  Please remember that this is for non-urgent requests.  _______________________________________________________

## 2023-11-07 NOTE — Addendum Note (Signed)
Addended by: Illene Bolus on: 11/07/2023 04:19 PM   Modules accepted: Orders

## 2023-11-08 ENCOUNTER — Other Ambulatory Visit: Payer: Self-pay

## 2023-11-08 ENCOUNTER — Telehealth: Payer: Self-pay

## 2023-11-08 DIAGNOSIS — R1319 Other dysphagia: Secondary | ICD-10-CM

## 2023-11-08 NOTE — Progress Notes (Signed)
DM,  1.  Please schedule her for barium swallow with tablet.  Let me know when the results are available. 2.  Please repeat CBC next week.  Share those results as well, when available. 3.  Soft foods until further notice.  Thanks,  Dr.

## 2023-11-08 NOTE — Telephone Encounter (Signed)
Spoke with patient's mom and informed her of Dr. Lamar Sprinkles recommendations. Patient's daughter states her mother is having repeat labs ordered by her oncologist and believes a CBC is something they are drawing. Informed her I will contact the radiology department regarding her Barium Swallow and call her back. Barium swallow scheduled at [facility] radiology 11/16/23 at 9:00 am, arriving at 8:30am. Informed patient's daughter of appt and told her to have her mom have nothing to eat or drink 3 hours prior to test. Patients daughter verbalized understanding.  Deanna,  Patient's daughter states her mom is having repeat labs including a CBC with oncology office on Monday.

## 2023-11-08 NOTE — Telephone Encounter (Signed)
-----   Message from Margarite Gouge May sent at 11/08/2023  9:50 AM EST ----- Marchelle Folks, Can you order the swallow test and CBC Dr. Marina Goodell would like on this patient please.  Thank you, Deanna NP,C ----- Message ----- From: Hilarie Fredrickson, MD Sent: 11/08/2023   9:16 AM EST To: Margarite Gouge May, NP     ----- Message ----- From: May, Deanna J, NP Sent: 11/07/2023   3:29 PM EST To: Hilarie Fredrickson, MD

## 2023-11-10 ENCOUNTER — Inpatient Hospital Stay: Payer: Medicare Other

## 2023-11-10 ENCOUNTER — Other Ambulatory Visit: Payer: Self-pay | Admitting: Medical Oncology

## 2023-11-10 ENCOUNTER — Telehealth: Payer: Self-pay

## 2023-11-10 ENCOUNTER — Other Ambulatory Visit: Payer: Self-pay | Admitting: Physician Assistant

## 2023-11-10 DIAGNOSIS — Z79899 Other long term (current) drug therapy: Secondary | ICD-10-CM | POA: Diagnosis not present

## 2023-11-10 DIAGNOSIS — D696 Thrombocytopenia, unspecified: Secondary | ICD-10-CM | POA: Diagnosis not present

## 2023-11-10 DIAGNOSIS — Z87891 Personal history of nicotine dependence: Secondary | ICD-10-CM | POA: Diagnosis not present

## 2023-11-10 DIAGNOSIS — Z5189 Encounter for other specified aftercare: Secondary | ICD-10-CM | POA: Diagnosis not present

## 2023-11-10 DIAGNOSIS — C3401 Malignant neoplasm of right main bronchus: Secondary | ICD-10-CM | POA: Diagnosis not present

## 2023-11-10 DIAGNOSIS — D6481 Anemia due to antineoplastic chemotherapy: Secondary | ICD-10-CM

## 2023-11-10 DIAGNOSIS — Z5111 Encounter for antineoplastic chemotherapy: Secondary | ICD-10-CM

## 2023-11-10 DIAGNOSIS — R1319 Other dysphagia: Secondary | ICD-10-CM | POA: Diagnosis not present

## 2023-11-10 LAB — CBC WITH DIFFERENTIAL (CANCER CENTER ONLY)
Abs Immature Granulocytes: 0.01 10*3/uL (ref 0.00–0.07)
Basophils Absolute: 0 10*3/uL (ref 0.0–0.1)
Basophils Relative: 1 %
Eosinophils Absolute: 0 10*3/uL (ref 0.0–0.5)
Eosinophils Relative: 1 %
HCT: 23.4 % — ABNORMAL LOW (ref 36.0–46.0)
Hemoglobin: 7.6 g/dL — ABNORMAL LOW (ref 12.0–15.0)
Immature Granulocytes: 1 %
Lymphocytes Relative: 38 %
Lymphs Abs: 0.7 10*3/uL (ref 0.7–4.0)
MCH: 34.4 pg — ABNORMAL HIGH (ref 26.0–34.0)
MCHC: 32.5 g/dL (ref 30.0–36.0)
MCV: 105.9 fL — ABNORMAL HIGH (ref 80.0–100.0)
Monocytes Absolute: 0.1 10*3/uL (ref 0.1–1.0)
Monocytes Relative: 7 %
Neutro Abs: 1 10*3/uL — ABNORMAL LOW (ref 1.7–7.7)
Neutrophils Relative %: 52 %
Platelet Count: 38 10*3/uL — ABNORMAL LOW (ref 150–400)
RBC: 2.21 MIL/uL — ABNORMAL LOW (ref 3.87–5.11)
RDW: 17.1 % — ABNORMAL HIGH (ref 11.5–15.5)
WBC Count: 1.8 10*3/uL — ABNORMAL LOW (ref 4.0–10.5)
nRBC: 0 % (ref 0.0–0.2)

## 2023-11-10 MED ORDER — METHYLPREDNISOLONE 4 MG PO TBPK: ORAL_TABLET | ORAL | 0 refills | Status: DC

## 2023-11-10 NOTE — Progress Notes (Signed)
Appts info left on [name]'s  VM at her request.

## 2023-11-10 NOTE — Telephone Encounter (Signed)
Spoke with patients daughter and confirmed blood product appt for Saturday morning at 8.  Rechecking labs Monday 1/27.

## 2023-11-11 ENCOUNTER — Other Ambulatory Visit: Payer: Self-pay | Admitting: Medical Oncology

## 2023-11-11 ENCOUNTER — Inpatient Hospital Stay: Payer: Medicare Other

## 2023-11-11 ENCOUNTER — Other Ambulatory Visit: Payer: Medicare Other

## 2023-11-11 ENCOUNTER — Telehealth: Payer: Self-pay | Admitting: Physician Assistant

## 2023-11-11 ENCOUNTER — Ambulatory Visit: Payer: Medicare Other

## 2023-11-11 VITALS — BP 110/57 | HR 83 | Temp 98.3°F | Resp 16 | Wt 80.9 lb

## 2023-11-11 DIAGNOSIS — T451X5A Adverse effect of antineoplastic and immunosuppressive drugs, initial encounter: Secondary | ICD-10-CM

## 2023-11-11 DIAGNOSIS — Z5189 Encounter for other specified aftercare: Secondary | ICD-10-CM | POA: Diagnosis not present

## 2023-11-11 DIAGNOSIS — C3401 Malignant neoplasm of right main bronchus: Secondary | ICD-10-CM | POA: Diagnosis not present

## 2023-11-11 DIAGNOSIS — D696 Thrombocytopenia, unspecified: Secondary | ICD-10-CM | POA: Diagnosis not present

## 2023-11-11 DIAGNOSIS — Z79899 Other long term (current) drug therapy: Secondary | ICD-10-CM | POA: Diagnosis not present

## 2023-11-11 DIAGNOSIS — R197 Diarrhea, unspecified: Secondary | ICD-10-CM

## 2023-11-11 DIAGNOSIS — Z5111 Encounter for antineoplastic chemotherapy: Secondary | ICD-10-CM | POA: Diagnosis not present

## 2023-11-11 DIAGNOSIS — Z87891 Personal history of nicotine dependence: Secondary | ICD-10-CM | POA: Diagnosis not present

## 2023-11-11 DIAGNOSIS — R1319 Other dysphagia: Secondary | ICD-10-CM | POA: Diagnosis not present

## 2023-11-11 DIAGNOSIS — D508 Other iron deficiency anemias: Secondary | ICD-10-CM

## 2023-11-11 LAB — PREPARE RBC (CROSSMATCH)

## 2023-11-11 MED ORDER — SODIUM CHLORIDE 0.9% IV SOLUTION
250.0000 mL | Freq: Once | INTRAVENOUS | Status: AC
  Administered 2023-11-11: 100 mL via INTRAVENOUS

## 2023-11-11 MED ORDER — ACETAMINOPHEN 325 MG PO TABS
650.0000 mg | ORAL_TABLET | Freq: Once | ORAL | Status: AC
  Administered 2023-11-11: 650 mg via ORAL
  Filled 2023-11-11: qty 2

## 2023-11-11 MED ORDER — DIPHENHYDRAMINE HCL 25 MG PO CAPS
25.0000 mg | ORAL_CAPSULE | Freq: Once | ORAL | Status: AC
Start: 2023-11-12 — End: 2023-11-11
  Administered 2023-11-11: 25 mg via ORAL
  Filled 2023-11-11: qty 1

## 2023-11-11 NOTE — Progress Notes (Signed)
Blood transfusion ordered entered

## 2023-11-11 NOTE — Progress Notes (Unsigned)
Order's released

## 2023-11-11 NOTE — Patient Instructions (Signed)

## 2023-11-12 ENCOUNTER — Inpatient Hospital Stay: Payer: Medicare Other

## 2023-11-14 ENCOUNTER — Encounter (HOSPITAL_COMMUNITY): Payer: Self-pay

## 2023-11-14 ENCOUNTER — Inpatient Hospital Stay: Payer: Medicare Other

## 2023-11-14 ENCOUNTER — Telehealth: Payer: Self-pay | Admitting: Medical Oncology

## 2023-11-14 ENCOUNTER — Ambulatory Visit (HOSPITAL_COMMUNITY)
Admission: RE | Admit: 2023-11-14 | Discharge: 2023-11-14 | Disposition: A | Discharge status: Home / Self Care | Payer: Medicare Other | Source: Ambulatory Visit | Attending: Physician Assistant

## 2023-11-14 DIAGNOSIS — I700 Atherosclerosis of aorta: Secondary | ICD-10-CM | POA: Diagnosis not present

## 2023-11-14 DIAGNOSIS — R1319 Other dysphagia: Secondary | ICD-10-CM | POA: Diagnosis not present

## 2023-11-14 DIAGNOSIS — Z87891 Personal history of nicotine dependence: Secondary | ICD-10-CM | POA: Diagnosis not present

## 2023-11-14 DIAGNOSIS — Z5111 Encounter for antineoplastic chemotherapy: Secondary | ICD-10-CM | POA: Diagnosis not present

## 2023-11-14 DIAGNOSIS — J432 Centrilobular emphysema: Secondary | ICD-10-CM | POA: Diagnosis not present

## 2023-11-14 DIAGNOSIS — Z79899 Other long term (current) drug therapy: Secondary | ICD-10-CM | POA: Diagnosis not present

## 2023-11-14 DIAGNOSIS — D696 Thrombocytopenia, unspecified: Secondary | ICD-10-CM | POA: Diagnosis not present

## 2023-11-14 DIAGNOSIS — C3490 Malignant neoplasm of unspecified part of unspecified bronchus or lung: Secondary | ICD-10-CM | POA: Diagnosis not present

## 2023-11-14 DIAGNOSIS — C3401 Malignant neoplasm of right main bronchus: Secondary | ICD-10-CM | POA: Insufficient documentation

## 2023-11-14 DIAGNOSIS — Z5189 Encounter for other specified aftercare: Secondary | ICD-10-CM | POA: Diagnosis not present

## 2023-11-14 DIAGNOSIS — I3139 Other pericardial effusion (noninflammatory): Secondary | ICD-10-CM | POA: Diagnosis not present

## 2023-11-14 LAB — TYPE AND SCREEN
ABO/RH(D): B POS
Antibody Screen: NEGATIVE
Unit division: 0
Unit division: 0

## 2023-11-14 LAB — CMP (CANCER CENTER ONLY)
ALT: 6 U/L (ref 0–44)
AST: 11 U/L — ABNORMAL LOW (ref 15–41)
Albumin: 3.8 g/dL (ref 3.5–5.0)
Alkaline Phosphatase: 84 U/L (ref 38–126)
Anion gap: 7 (ref 5–15)
BUN: 13 mg/dL (ref 8–23)
CO2: 28 mmol/L (ref 22–32)
Calcium: 9.5 mg/dL (ref 8.9–10.3)
Chloride: 106 mmol/L (ref 98–111)
Creatinine: 0.58 mg/dL (ref 0.44–1.00)
GFR, Estimated: >60 mL/min (ref >60)
Glucose, Bld: 87 mg/dL (ref 70–99)
Potassium: 4.4 mmol/L (ref 3.5–5.1)
Sodium: 141 mmol/L (ref 135–145)
Total Bilirubin: 0.3 mg/dL (ref 0.0–1.2)
Total Protein: 6.9 g/dL (ref 6.5–8.1)

## 2023-11-14 LAB — BPAM RBC
Blood Product Expiration Date: 202502172359
Blood Product Expiration Date: 202502172359
ISSUE DATE / TIME: 202501241349
ISSUE DATE / TIME: 202501241349
Unit Type and Rh: 7300
Unit Type and Rh: 7300

## 2023-11-14 LAB — CBC WITH DIFFERENTIAL (CANCER CENTER ONLY)
Abs Immature Granulocytes: 0 10*3/uL (ref 0.00–0.07)
Basophils Absolute: 0 10*3/uL (ref 0.0–0.1)
Basophils Relative: 0 %
Eosinophils Absolute: 0 10*3/uL (ref 0.0–0.5)
Eosinophils Relative: 3 %
HCT: 33.3 % — ABNORMAL LOW (ref 36.0–46.0)
Hemoglobin: 10.9 g/dL — ABNORMAL LOW (ref 12.0–15.0)
Immature Granulocytes: 0 %
Lymphocytes Relative: 33 %
Lymphs Abs: 0.4 10*3/uL — ABNORMAL LOW (ref 0.7–4.0)
MCH: 33 pg (ref 26.0–34.0)
MCHC: 32.7 g/dL (ref 30.0–36.0)
MCV: 100.9 fL — ABNORMAL HIGH (ref 80.0–100.0)
Monocytes Absolute: 0.2 10*3/uL (ref 0.1–1.0)
Monocytes Relative: 14 %
Neutro Abs: 0.6 10*3/uL — ABNORMAL LOW (ref 1.7–7.7)
Neutrophils Relative %: 50 %
Platelet Count: 31 10*3/uL — ABNORMAL LOW (ref 150–400)
RBC: 3.3 MIL/uL — ABNORMAL LOW (ref 3.87–5.11)
RDW: 18.6 % — ABNORMAL HIGH (ref 11.5–15.5)
WBC Count: 1.2 10*3/uL — ABNORMAL LOW (ref 4.0–10.5)
nRBC: 0 % (ref 0.0–0.2)

## 2023-11-14 LAB — MAGNESIUM: Magnesium: 1.5 mg/dL — ABNORMAL LOW (ref 1.7–2.4)

## 2023-11-14 LAB — SAMPLE TO BLOOD BANK

## 2023-11-14 MED ORDER — IOHEXOL 300 MG/ML  SOLN
75.0000 mL | Freq: Once | INTRAMUSCULAR | Status: AC | PRN
Start: 2023-11-14 — End: 2023-11-14
  Administered 2023-11-14: 75 mL via INTRAVENOUS

## 2023-11-14 NOTE — Telephone Encounter (Signed)
HGB 10.9,plts 30 k ., Mag 1.5.-Reported tp pt and dtr. h Pt denies bleeding , bruising. I gave her and dtr platelet precautions.  Pt instructed to increase mag to tid. She said one more tablet makes her nauseated, however , she will try to take mag tid.  Appt given for labs on wed.

## 2023-11-16 ENCOUNTER — Encounter: Payer: Self-pay | Admitting: Internal Medicine

## 2023-11-16 ENCOUNTER — Inpatient Hospital Stay: Payer: Medicare Other

## 2023-11-16 ENCOUNTER — Ambulatory Visit (HOSPITAL_COMMUNITY)
Admission: RE | Admit: 2023-11-16 | Discharge: 2023-11-16 | Disposition: A | Discharge status: Home / Self Care | Payer: Medicare Other | Source: Ambulatory Visit | Attending: Gastroenterology

## 2023-11-16 ENCOUNTER — Telehealth: Payer: Self-pay | Admitting: Internal Medicine

## 2023-11-16 ENCOUNTER — Other Ambulatory Visit: Payer: Self-pay | Admitting: Physician Assistant

## 2023-11-16 VITALS — BP 135/78 | HR 94 | Temp 98.0°F | Resp 16

## 2023-11-16 DIAGNOSIS — C3401 Malignant neoplasm of right main bronchus: Secondary | ICD-10-CM | POA: Diagnosis not present

## 2023-11-16 DIAGNOSIS — K222 Esophageal obstruction: Secondary | ICD-10-CM | POA: Diagnosis not present

## 2023-11-16 DIAGNOSIS — R1319 Other dysphagia: Secondary | ICD-10-CM | POA: Insufficient documentation

## 2023-11-16 DIAGNOSIS — Z5111 Encounter for antineoplastic chemotherapy: Secondary | ICD-10-CM | POA: Diagnosis not present

## 2023-11-16 DIAGNOSIS — Z5189 Encounter for other specified aftercare: Secondary | ICD-10-CM | POA: Diagnosis not present

## 2023-11-16 DIAGNOSIS — D696 Thrombocytopenia, unspecified: Secondary | ICD-10-CM | POA: Diagnosis not present

## 2023-11-16 DIAGNOSIS — R1310 Dysphagia, unspecified: Secondary | ICD-10-CM | POA: Diagnosis not present

## 2023-11-16 DIAGNOSIS — D701 Agranulocytosis secondary to cancer chemotherapy: Secondary | ICD-10-CM

## 2023-11-16 DIAGNOSIS — Z79899 Other long term (current) drug therapy: Secondary | ICD-10-CM | POA: Diagnosis not present

## 2023-11-16 DIAGNOSIS — D508 Other iron deficiency anemias: Secondary | ICD-10-CM

## 2023-11-16 DIAGNOSIS — Z87891 Personal history of nicotine dependence: Secondary | ICD-10-CM | POA: Diagnosis not present

## 2023-11-16 DIAGNOSIS — C3490 Malignant neoplasm of unspecified part of unspecified bronchus or lung: Secondary | ICD-10-CM | POA: Diagnosis not present

## 2023-11-16 LAB — CBC WITH DIFFERENTIAL (CANCER CENTER ONLY)
Abs Immature Granulocytes: 0 10*3/uL (ref 0.00–0.07)
Basophils Absolute: 0 10*3/uL (ref 0.0–0.1)
Basophils Relative: 0 %
Eosinophils Absolute: 0 10*3/uL (ref 0.0–0.5)
Eosinophils Relative: 4 %
HCT: 31.1 % — ABNORMAL LOW (ref 36.0–46.0)
Hemoglobin: 10.4 g/dL — ABNORMAL LOW (ref 12.0–15.0)
Immature Granulocytes: 0 %
Lymphocytes Relative: 39 %
Lymphs Abs: 0.4 10*3/uL — ABNORMAL LOW (ref 0.7–4.0)
MCH: 33.3 pg (ref 26.0–34.0)
MCHC: 33.4 g/dL (ref 30.0–36.0)
MCV: 99.7 fL (ref 80.0–100.0)
Monocytes Absolute: 0.3 10*3/uL (ref 0.1–1.0)
Monocytes Relative: 30 %
Neutro Abs: 0.3 10*3/uL — CL (ref 1.7–7.7)
Neutrophils Relative %: 27 %
Platelet Count: 38 10*3/uL — ABNORMAL LOW (ref 150–400)
RBC: 3.12 MIL/uL — ABNORMAL LOW (ref 3.87–5.11)
RDW: 18.1 % — ABNORMAL HIGH (ref 11.5–15.5)
WBC Count: 1 10*3/uL — ABNORMAL LOW (ref 4.0–10.5)
nRBC: 0 % (ref 0.0–0.2)

## 2023-11-16 LAB — CMP (CANCER CENTER ONLY)
ALT: 6 U/L (ref 0–44)
AST: 9 U/L — ABNORMAL LOW (ref 15–41)
Albumin: 3.7 g/dL (ref 3.5–5.0)
Alkaline Phosphatase: 78 U/L (ref 38–126)
Anion gap: 7 (ref 5–15)
BUN: 16 mg/dL (ref 8–23)
CO2: 29 mmol/L (ref 22–32)
Calcium: 9.4 mg/dL (ref 8.9–10.3)
Chloride: 103 mmol/L (ref 98–111)
Creatinine: 0.56 mg/dL (ref 0.44–1.00)
GFR, Estimated: >60 mL/min (ref >60)
Glucose, Bld: 98 mg/dL (ref 70–99)
Potassium: 4.1 mmol/L (ref 3.5–5.1)
Sodium: 139 mmol/L (ref 135–145)
Total Bilirubin: 0.3 mg/dL (ref 0.0–1.2)
Total Protein: 6.6 g/dL (ref 6.5–8.1)

## 2023-11-16 LAB — MAGNESIUM: Magnesium: 1.6 mg/dL — ABNORMAL LOW (ref 1.7–2.4)

## 2023-11-16 LAB — SAMPLE TO BLOOD BANK

## 2023-11-16 MED ORDER — FILGRASTIM-SNDZ 300 MCG/0.5ML IJ SOSY
300.0000 ug | PREFILLED_SYRINGE | Freq: Once | INTRAMUSCULAR | Status: AC
  Administered 2023-11-16: 300 ug via SUBCUTANEOUS
  Filled 2023-11-16: qty 0.5

## 2023-11-16 NOTE — Progress Notes (Signed)
[name], 1.  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  It may get better with time (if she has completed radiation) as the edema subsides. 3.  Her white blood cell count and platelets are too low for endoscopy with dilation 4.  She should be on liquids and pured foods only, to avoid food impaction 5.  You should see her back in the office in about 1 month to reassess her swallowing and repeat her blood counts. Keep me posted. Thanks Dr. Marina Goodell

## 2023-11-16 NOTE — Telephone Encounter (Signed)
Patient has been scheduled. Aware of appt dates and times.     Scheduling Message Entered by Terald Sleeper B on 11/16/2023 at  8:36 AM Priority: High INJECTION 30  Department: [facility] ONC  Provider:  Scheduling Notes:  Good morning!  This patient is usually seen here at the [facility], but [facility]al[facility] Levan is closer for her.  Could you please schedule patient for an injection appointment on Thursday 1/30 (if possible after 12 and before 4) and Friday 1/31 (anytime after 1230).  Please call patients daughter, Carollee Herter at [phone].  Thanks so much!

## 2023-11-17 ENCOUNTER — Telehealth: Payer: Self-pay

## 2023-11-17 ENCOUNTER — Inpatient Hospital Stay: Payer: Medicare Other

## 2023-11-17 VITALS — BP 119/62 | HR 100 | Temp 98.4°F | Resp 18 | Ht 61.0 in | Wt 77.1 lb

## 2023-11-17 DIAGNOSIS — R1319 Other dysphagia: Secondary | ICD-10-CM | POA: Diagnosis not present

## 2023-11-17 DIAGNOSIS — Z5189 Encounter for other specified aftercare: Secondary | ICD-10-CM | POA: Diagnosis not present

## 2023-11-17 DIAGNOSIS — Z79899 Other long term (current) drug therapy: Secondary | ICD-10-CM | POA: Diagnosis not present

## 2023-11-17 DIAGNOSIS — Z5111 Encounter for antineoplastic chemotherapy: Secondary | ICD-10-CM | POA: Diagnosis not present

## 2023-11-17 DIAGNOSIS — D696 Thrombocytopenia, unspecified: Secondary | ICD-10-CM | POA: Diagnosis not present

## 2023-11-17 DIAGNOSIS — C3401 Malignant neoplasm of right main bronchus: Secondary | ICD-10-CM | POA: Diagnosis not present

## 2023-11-17 DIAGNOSIS — Z87891 Personal history of nicotine dependence: Secondary | ICD-10-CM | POA: Diagnosis not present

## 2023-11-17 DIAGNOSIS — T451X5A Adverse effect of antineoplastic and immunosuppressive drugs, initial encounter: Secondary | ICD-10-CM

## 2023-11-17 DIAGNOSIS — D508 Other iron deficiency anemias: Secondary | ICD-10-CM

## 2023-11-17 MED ORDER — FILGRASTIM-SNDZ 300 MCG/0.5ML IJ SOSY
300.0000 ug | PREFILLED_SYRINGE | Freq: Once | INTRAMUSCULAR | Status: AC
  Administered 2023-11-17: 300 ug via SUBCUTANEOUS
  Filled 2023-11-17: qty 0.5

## 2023-11-17 NOTE — Telephone Encounter (Signed)
Called patient with no answer and voicemail box is full. Will send MyChart message with appt date and time.

## 2023-11-17 NOTE — Patient Instructions (Signed)
Filgrastim Injection What is this medication? FILGRASTIM (fil GRA stim) lowers the risk of infection in people who are receiving chemotherapy. It works by Systems analyst make more white blood cells, which protects your body from infection. It may also be used to help people who have been exposed to high doses of radiation. It can be used to help prepare your body before a stem cell transplant. It works by helping your bone marrow make and release stem cells into the blood. This medicine may be used for other purposes; ask your health care provider or pharmacist if you have questions. COMMON BRAND NAME(S): Neupogen, Nivestym, Nypozi, Releuko, Zarxio What should I tell my care team before I take this medication? They need to know if you have any of these conditions: History of blood diseases, such as sickle cell anemia Kidney disease Recent or ongoing radiation An unusual or allergic reaction to filgrastim, pegfilgrastim, latex, rubber, other medications, foods, dyes, or preservatives Pregnant or trying to get pregnant Breast-feeding How should I use this medication? This medication is injected under the skin or into a vein. It is usually given by your care team in a hospital or clinic setting. It may be given at home. If you get this medication at home, you will be taught how to prepare and give it. Use exactly as directed. Take it as directed on the prescription label at the same time every day. Keep taking it unless your care team tells you to stop. It is important that you put your used needles and syringes in a special sharps container. Do not put them in a trash can. If you do not have a sharps container, call your pharmacist or care team to get one. This medication comes with INSTRUCTIONS FOR USE. Ask your pharmacist for directions on how to use this medication. Read the information carefully. Talk to your pharmacist or care team if you have questions. Talk to your care team about the use of  this medication in children. While it may be prescribed for children for selected conditions, precautions do apply. Overdosage: If you think you have taken too much of this medicine contact a poison control center or emergency room at once. NOTE: This medicine is only for you. Do not share this medicine with others. What if I miss a dose? It is important not to miss any doses. Talk to your care team about what to do if you miss a dose. What may interact with this medication? Medications that may cause a release of neutrophils, such as lithium This list may not describe all possible interactions. Give your health care provider a list of all the medicines, herbs, non-prescription drugs, or dietary supplements you use. Also tell them if you smoke, drink alcohol, or use illegal drugs. Some items may interact with your medicine. What should I watch for while using this medication? Your condition will be monitored carefully while you are receiving this medication. You may need bloodwork while taking this medication. Talk to your care team about your risk of cancer. You may be more at risk for certain types of cancer if you take this medication. What side effects may I notice from receiving this medication? Side effects that you should report to your care team as soon as possible: Allergic reactions--skin rash, itching, hives, swelling of the face, lips, tongue, or throat Capillary leak syndrome--stomach or muscle pain, unusual weakness or fatigue, feeling faint or lightheaded, decrease in the amount of urine, swelling of the ankles, hands,  or feet, trouble breathing High white blood cell level--fever, fatigue, trouble breathing, night sweats, change in vision, weight loss Inflammation of the aorta--fever, fatigue, back, chest, or stomach pain, severe headache Kidney injury (glomerulonephritis)--decrease in the amount of urine, red or dark brown urine, foamy or bubbly urine, swelling of the ankles, hands,  or feet Shortness of breath or trouble breathing Spleen injury--pain in upper left stomach or shoulder Unusual bruising or bleeding Side effects that usually do not require medical attention (report to your care team if they continue or are bothersome): Back pain Bone pain Fatigue Fever Headache Nausea This list may not describe all possible side effects. Call your doctor for medical advice about side effects. You may report side effects to FDA at 1-800-FDA-1088. Where should I keep my medication? Keep out of the reach of children and pets. Keep this medication in the original packaging until you are ready to take it. Protect from light. See product for storage information. Each product may have different instructions. Get rid of any unused medication after the expiration date. To get rid of medications that are no longer needed or have expired: Take the medication to a medications take-back program. Check with your pharmacy or law enforcement to find a location. If you cannot return the medication, ask your pharmacist or care team how to get rid of this medication safely. NOTE: This sheet is a summary. It may not cover all possible information. If you have questions about this medicine, talk to your doctor, pharmacist, or health care provider.  2024 [employer] Standard (2022-02-25 00:00:00)

## 2023-11-17 NOTE — Telephone Encounter (Signed)
-----   Message from Margarite Gouge May sent at 11/17/2023 12:32 PM EST ----- Yes that is fine. Thanks, Deanna ----- Message ----- From: Illene Bolus, CMA Sent: 11/17/2023   9:38 AM EST To: Margarite Gouge May, NP  You do not have a one month f/u. Your first available is 3/10. Is this ok? ----- Message ----- From: May, Deanna J, NP Sent: 11/17/2023   9:13 AM EST To: Illene Bolus, CMA  Marchelle Folks, Can you schedule 1 month follow-up for this patient with me please.   Deanna , NP-C

## 2023-11-18 ENCOUNTER — Inpatient Hospital Stay: Payer: Medicare Other

## 2023-11-18 ENCOUNTER — Other Ambulatory Visit: Payer: Self-pay | Admitting: Physician Assistant

## 2023-11-18 VITALS — BP 118/58 | HR 98 | Temp 98.0°F | Resp 18

## 2023-11-18 DIAGNOSIS — D508 Other iron deficiency anemias: Secondary | ICD-10-CM

## 2023-11-18 DIAGNOSIS — Z79899 Other long term (current) drug therapy: Secondary | ICD-10-CM | POA: Diagnosis not present

## 2023-11-18 DIAGNOSIS — C3401 Malignant neoplasm of right main bronchus: Secondary | ICD-10-CM | POA: Diagnosis not present

## 2023-11-18 DIAGNOSIS — Z5111 Encounter for antineoplastic chemotherapy: Secondary | ICD-10-CM | POA: Diagnosis not present

## 2023-11-18 DIAGNOSIS — R1319 Other dysphagia: Secondary | ICD-10-CM | POA: Diagnosis not present

## 2023-11-18 DIAGNOSIS — D701 Agranulocytosis secondary to cancer chemotherapy: Secondary | ICD-10-CM

## 2023-11-18 DIAGNOSIS — Z5189 Encounter for other specified aftercare: Secondary | ICD-10-CM | POA: Diagnosis not present

## 2023-11-18 DIAGNOSIS — Z87891 Personal history of nicotine dependence: Secondary | ICD-10-CM | POA: Diagnosis not present

## 2023-11-18 DIAGNOSIS — D696 Thrombocytopenia, unspecified: Secondary | ICD-10-CM | POA: Diagnosis not present

## 2023-11-18 MED ORDER — FILGRASTIM-SNDZ 300 MCG/0.5ML IJ SOSY
300.0000 ug | PREFILLED_SYRINGE | Freq: Once | INTRAMUSCULAR | Status: AC
  Administered 2023-11-18: 300 ug via SUBCUTANEOUS
  Filled 2023-11-18: qty 0.5

## 2023-11-18 NOTE — Patient Instructions (Signed)
 Filgrastim Injection What is this medication? FILGRASTIM (fil GRA stim) lowers the risk of infection in people who are receiving chemotherapy. It works by Systems analyst make more white blood cells, which protects your body from infection. It may also be used to help people who have been exposed to high doses of radiation. It can be used to help prepare your body before a stem cell transplant. It works by helping your bone marrow make and release stem cells into the blood. This medicine may be used for other purposes; ask your health care provider or pharmacist if you have questions. COMMON BRAND NAME(S): Neupogen, Nivestym, Nypozi, Releuko, Zarxio What should I tell my care team before I take this medication? They need to know if you have any of these conditions: History of blood diseases, such as sickle cell anemia Kidney disease Recent or ongoing radiation An unusual or allergic reaction to filgrastim, pegfilgrastim, latex, rubber, other medications, foods, dyes, or preservatives Pregnant or trying to get pregnant Breast-feeding How should I use this medication? This medication is injected under the skin or into a vein. It is usually given by your care team in a hospital or clinic setting. It may be given at home. If you get this medication at home, you will be taught how to prepare and give it. Use exactly as directed. Take it as directed on the prescription label at the same time every day. Keep taking it unless your care team tells you to stop. It is important that you put your used needles and syringes in a special sharps container. Do not put them in a trash can. If you do not have a sharps container, call your pharmacist or care team to get one. This medication comes with INSTRUCTIONS FOR USE. Ask your pharmacist for directions on how to use this medication. Read the information carefully. Talk to your pharmacist or care team if you have questions. Talk to your care team about the use of  this medication in children. While it may be prescribed for children for selected conditions, precautions do apply. Overdosage: If you think you have taken too much of this medicine contact a poison control center or emergency room at once. NOTE: This medicine is only for you. Do not share this medicine with others. What if I miss a dose? It is important not to miss any doses. Talk to your care team about what to do if you miss a dose. What may interact with this medication? Medications that may cause a release of neutrophils, such as lithium This list may not describe all possible interactions. Give your health care provider a list of all the medicines, herbs, non-prescription drugs, or dietary supplements you use. Also tell them if you smoke, drink alcohol, or use illegal drugs. Some items may interact with your medicine. What should I watch for while using this medication? Your condition will be monitored carefully while you are receiving this medication. You may need bloodwork while taking this medication. Talk to your care team about your risk of cancer. You may be more at risk for certain types of cancer if you take this medication. What side effects may I notice from receiving this medication? Side effects that you should report to your care team as soon as possible: Allergic reactions--skin rash, itching, hives, swelling of the face, lips, tongue, or throat Capillary leak syndrome--stomach or muscle pain, unusual weakness or fatigue, feeling faint or lightheaded, decrease in the amount of urine, swelling of the ankles, hands,  or feet, trouble breathing High white blood cell level--fever, fatigue, trouble breathing, night sweats, change in vision, weight loss Inflammation of the aorta--fever, fatigue, back, chest, or stomach pain, severe headache Kidney injury (glomerulonephritis)--decrease in the amount of urine, red or dark brown urine, foamy or bubbly urine, swelling of the ankles, hands,  or feet Shortness of breath or trouble breathing Spleen injury--pain in upper left stomach or shoulder Unusual bruising or bleeding Side effects that usually do not require medical attention (report to your care team if they continue or are bothersome): Back pain Bone pain Fatigue Fever Headache Nausea This list may not describe all possible side effects. Call your doctor for medical advice about side effects. You may report side effects to FDA at 1-800-FDA-1088. Where should I keep my medication? Keep out of the reach of children and pets. Keep this medication in the original packaging until you are ready to take it. Protect from light. See product for storage information. Each product may have different instructions. Get rid of any unused medication after the expiration date. To get rid of medications that are no longer needed or have expired: Take the medication to a medications take-back program. Check with your pharmacy or law enforcement to find a location. If you cannot return the medication, ask your pharmacist or care team how to get rid of this medication safely. NOTE: This sheet is a summary. It may not cover all possible information. If you have questions about this medicine, talk to your doctor, pharmacist, or health care provider.  2024 [employer] Standard (2022-02-25 00:00:00)

## 2023-11-21 ENCOUNTER — Inpatient Hospital Stay: Payer: Medicare Other

## 2023-11-21 ENCOUNTER — Ambulatory Visit: Payer: Medicare Other

## 2023-11-21 ENCOUNTER — Other Ambulatory Visit: Payer: Medicare Other

## 2023-11-21 ENCOUNTER — Inpatient Hospital Stay: Payer: Medicare Other | Attending: Physician Assistant | Admitting: Internal Medicine

## 2023-11-21 VITALS — BP 116/55 | HR 91 | Temp 98.7°F | Resp 16 | Ht 61.0 in | Wt 78.7 lb

## 2023-11-21 DIAGNOSIS — R1310 Dysphagia, unspecified: Secondary | ICD-10-CM | POA: Insufficient documentation

## 2023-11-21 DIAGNOSIS — D508 Other iron deficiency anemias: Secondary | ICD-10-CM

## 2023-11-21 DIAGNOSIS — Z79899 Other long term (current) drug therapy: Secondary | ICD-10-CM | POA: Insufficient documentation

## 2023-11-21 DIAGNOSIS — C3490 Malignant neoplasm of unspecified part of unspecified bronchus or lung: Secondary | ICD-10-CM

## 2023-11-21 DIAGNOSIS — T451X5A Adverse effect of antineoplastic and immunosuppressive drugs, initial encounter: Secondary | ICD-10-CM

## 2023-11-21 DIAGNOSIS — Z87891 Personal history of nicotine dependence: Secondary | ICD-10-CM | POA: Diagnosis not present

## 2023-11-21 DIAGNOSIS — C3401 Malignant neoplasm of right main bronchus: Secondary | ICD-10-CM | POA: Diagnosis not present

## 2023-11-21 LAB — CBC WITH DIFFERENTIAL (CANCER CENTER ONLY)
Abs Immature Granulocytes: 0.17 10*3/uL — ABNORMAL HIGH (ref 0.00–0.07)
Basophils Absolute: 0 10*3/uL (ref 0.0–0.1)
Basophils Relative: 1 %
Eosinophils Absolute: 0.1 10*3/uL (ref 0.0–0.5)
Eosinophils Relative: 1 %
HCT: 32.8 % — ABNORMAL LOW (ref 36.0–46.0)
Hemoglobin: 10.6 g/dL — ABNORMAL LOW (ref 12.0–15.0)
Immature Granulocytes: 3 %
Lymphocytes Relative: 13 %
Lymphs Abs: 0.8 10*3/uL (ref 0.7–4.0)
MCH: 33.8 pg (ref 26.0–34.0)
MCHC: 32.3 g/dL (ref 30.0–36.0)
MCV: 104.5 fL — ABNORMAL HIGH (ref 80.0–100.0)
Monocytes Absolute: 1 10*3/uL (ref 0.1–1.0)
Monocytes Relative: 15 %
Neutro Abs: 4.4 10*3/uL (ref 1.7–7.7)
Neutrophils Relative %: 67 %
Platelet Count: 57 10*3/uL — ABNORMAL LOW (ref 150–400)
RBC: 3.14 MIL/uL — ABNORMAL LOW (ref 3.87–5.11)
RDW: 19.9 % — ABNORMAL HIGH (ref 11.5–15.5)
WBC Count: 6.5 10*3/uL (ref 4.0–10.5)
nRBC: 0 % (ref 0.0–0.2)

## 2023-11-21 LAB — CMP (CANCER CENTER ONLY)
ALT: 7 U/L (ref 0–44)
AST: 10 U/L — ABNORMAL LOW (ref 15–41)
Albumin: 3.6 g/dL (ref 3.5–5.0)
Alkaline Phosphatase: 105 U/L (ref 38–126)
Anion gap: 8 (ref 5–15)
BUN: 11 mg/dL (ref 8–23)
CO2: 26 mmol/L (ref 22–32)
Calcium: 8.9 mg/dL (ref 8.9–10.3)
Chloride: 106 mmol/L (ref 98–111)
Creatinine: 0.63 mg/dL (ref 0.44–1.00)
GFR, Estimated: >60 mL/min (ref >60)
Glucose, Bld: 142 mg/dL — ABNORMAL HIGH (ref 70–99)
Potassium: 3.5 mmol/L (ref 3.5–5.1)
Sodium: 140 mmol/L (ref 135–145)
Total Bilirubin: 0.2 mg/dL (ref 0.0–1.2)
Total Protein: 6.2 g/dL — ABNORMAL LOW (ref 6.5–8.1)

## 2023-11-21 LAB — SAMPLE TO BLOOD BANK

## 2023-11-21 LAB — IRON AND IRON BINDING CAPACITY (CC-WL,HP ONLY)
Iron: 42 ug/dL (ref 28–170)
Saturation Ratios: 15 % (ref 10.4–31.8)
TIBC: 277 ug/dL (ref 250–450)
UIBC: 235 ug/dL (ref 148–442)

## 2023-11-21 LAB — FERRITIN: Ferritin: 428 ng/mL — ABNORMAL HIGH (ref 11–307)

## 2023-11-21 NOTE — Progress Notes (Signed)
[facility] [phone]   [phone]  OFFICE PROGRESS NOTE  [name], MD [address] [place] [address]  DIAGNOSIS: Limited stage small cell lung cancer (T1c, N2, M0) small cell lung cancer. The patient presented with a hilar mass and right paratracheal lymphadenopathy. She was diagnosed in October 2024.    PRIOR THERAPY: None   CURRENT THERAPY: Systemic chemotherapy with cisplatin on day 1 and etoposide 100 mg/m on days 1, 2, and 3 IV every 3 weeks.  This is concurrent radiation.  First dose on 08/08/2023. Status post 3 cycles.   INTERVAL HISTORY: [name] 68 y.o. female returns to the clinic today for follow-up visit accompanied by her daughter. Discussed the use of AI scribe software for clinical note transcription with the patient, who gave verbal consent to proceed.  History of Present Illness   The patient is a 68 year old female with limited stage small cell lung cancer who presents with difficulty swallowing.  She was diagnosed with limited stage small cell lung cancer in October 2024 and underwent four cycles of chemotherapy with carboplatin and etoposide, experiencing significant side effects such as low blood counts. Concurrent radiation therapy was also administered.  She currently has difficulty swallowing, which she attributes to the radiation treatment. She is managing this symptom with Carafate. A barium swallow study was conducted last week by her gastroenterologist, but an EGD has been postponed.  She weighs 78.7 pounds and wants to increase her weight, stating she is 'ready to eat solid food.'  Her recent scan shows significant improvement in her cancer, with a decrease in the size of abnormal lymph nodes in the right lung and no new mass lesions, fluid collections, or lymph node enlargements. A stable 5 mm lesion in the right middle lobe is noted.  Her blood counts are recovering  post-chemotherapy, with a hemoglobin level of 10.6, a total white blood count of 6.5, and an absolute neutrophil count of 4400. Her blood sugar is 142, and her iron levels are within normal range.       MEDICAL HISTORY: Past Medical History:  Diagnosis Date   Allergy    Anemia    Arthritis    Asthma    Blood transfusion without reported diagnosis    Cataract    COPD (chronic obstructive pulmonary disease) (HCC)    Duodenal ulcer    Emphysema of lung (HCC)    Family history of malignant neoplasm of gastrointestinal tract    GERD (gastroesophageal reflux disease)    Hepatitis    had in 6th grade pt unsure which kind   Hiatal hernia    HTN (hypertension) 10/03/2014   Hx of colonic polyps    Hyperlipemia    Internal hemorrhoids    Macular degeneration    Osteoporosis    Peripheral vascular disease (HCC)    Pneumonia    hx of   Shortness of breath dyspnea    with ambulation   Small cell carcinoma metastatic to right lung (HCC)    Stroke (HCC)    Vitamin D deficiency     ALLERGIES:  is allergic to decadron [dexamethasone], celebrex [celecoxib], crestor [rosuvastatin], doxycycline, penicillins, pravastatin, and hydrocodone-acetaminophen.  MEDICATIONS:  Current Outpatient Medications  Medication Sig Dispense Refill   albuterol (PROVENTIL) (2.5 MG/3ML) 0.083% nebulizer solution Take 3 mLs (2.5 mg total) by nebulization every 6 (six) hours as needed for wheezing or shortness of breath. 75 mL 12  albuterol (VENTOLIN HFA) 108 (90 Base) MCG/ACT inhaler 2 Inhalations 15 to 20  minutes apart every 4 hours to Rescue Asthma 18 g 3   AMBULATORY NON FORMULARY MEDICATION Medication Name: Magic mouthwash 1 part viscous lidocaine 2% 1 part Maalox 1 part Benadryl 12.5 mg per 5 mL elixir  Swish and swallow 5 cc every 6 hours as needed 300 mL 1   Ascorbic Acid (VITAMIN C) 1000 MG tablet Take 1,000 mg by mouth daily. Takes 7,000mg      Cholecalciferol (VITAMIN D) 50 MCG (2000 UT) CAPS Take  by mouth. Takes 6,000 units     ezetimibe (ZETIA) 10 MG tablet Take 1 tablet (10 mg total) by mouth daily. 30 tablet 11   FeFum-FePoly-FA-B Cmp-C-Biot (INTEGRA PLUS) CAPS Take 1 capsule by mouth every morning. 30 capsule 2   fluticasone-salmeterol (ADVAIR DISKUS) 250-50 MCG/ACT AEPB Inhale 1 puff into the lungs in the morning and at bedtime. 60 each 1   ipratropium-albuterol (DUONEB) 0.5-2.5 (3) MG/3ML SOLN Take 3 mLs by nebulization every 4 (four) hours as needed. Max:6 doses per day 540 mL 0   magnesium oxide (MAG-OX) 400 (240 Mg) MG tablet Take 1 tablet (400 mg total) by mouth daily. 30 tablet 0   methylPREDNISolone (MEDROL DOSEPAK) 4 MG TBPK tablet Use as instructed 21 tablet 0   omeprazole (PRILOSEC) 20 MG capsule TAKE 1 CAPSULE BY MOUTH TWICE DAILY TO PREVENT INDIGESTION & HEARTBURN 180 capsule 1   ondansetron (ZOFRAN) 8 MG tablet TAKE 1 TABLET (8 MG TOTAL) BY MOUTH EVERY 8 (EIGHT) HOURS AS NEEDED FOR NAUSEA OR VOMITING. STARTING 3 DAYS AFTER CHEMOTHERAPY 30 tablet 1   prochlorperazine (COMPAZINE) 10 MG tablet Take 1 tablet (10 mg total) by mouth every 6 (six) hours as needed. 30 tablet 2   sucralfate (CARAFATE) 1 g tablet Take 1 tablet (1 g total) by mouth 4 (four) times daily. Dissolve each tablet in 15 cc water before use. 120 tablet 2   No current facility-administered medications for this visit.    SURGICAL HISTORY:  Past Surgical History:  Procedure Laterality Date   ABDOMINAL HYSTERECTOMY     APPENDECTOMY     CATARACT EXTRACTION, BILATERAL     COLONOSCOPY     ELBOW SURGERY Right    ENDARTERECTOMY Right 12/16/2014   Procedure: ENDARTERECTOMY CAROTID with Dacron patch;  Surgeon: Larina Earthly, MD;  Location: [facility] OR;  Service: Vascular;  Laterality: Right;   ESOPHAGOGASTRODUODENOSCOPY  10/2008   FINE NEEDLE ASPIRATION BIOPSY  07/25/2023   Procedure: FINE NEEDLE ASPIRATION BIOPSY;  Surgeon: Leslye Peer, MD;  Location: [facility] ENDOSCOPY;  Service: Pulmonary;;   HAND SURGERY Left     LUMBAR DISC SURGERY     TOTAL HIP ARTHROPLASTY Right    trigeminal neuralgia surgery     UPPER GASTROINTESTINAL ENDOSCOPY     VIDEO BRONCHOSCOPY WITH ENDOBRONCHIAL ULTRASOUND N/A 07/25/2023   Procedure: VIDEO BRONCHOSCOPY WITH ENDOBRONCHIAL ULTRASOUND;  Surgeon: Leslye Peer, MD;  Location: [facility] ENDOSCOPY;  Service: Pulmonary;  Laterality: N/A;   WRIST SURGERY Left     REVIEW OF SYSTEMS:  Constitutional: positive for anorexia, fatigue, and weight loss Eyes: negative Ears, nose, mouth, throat, and face: negative Respiratory: negative Cardiovascular: negative Gastrointestinal: positive for dysphagia and odynophagia Genitourinary:negative Integument/breast: negative Hematologic/lymphatic: negative Musculoskeletal:negative Neurological: negative Behavioral/Psych: negative Endocrine: negative Allergic/Immunologic: negative   PHYSICAL EXAMINATION: General appearance: alert, cooperative, fatigued, and no distress Head: Normocephalic, without obvious abnormality, atraumatic Neck: no adenopathy, no JVD, supple, symmetrical, trachea midline, and thyroid  not enlarged, symmetric, no tenderness/mass/nodules Lymph nodes: Cervical, supraclavicular, and axillary nodes normal. Resp: clear to auscultation bilaterally Back: symmetric, no curvature. ROM normal. No CVA tenderness. Cardio: regular rate and rhythm, S1, S2 normal, no murmur, click, rub or gallop GI: soft, non-tender; bowel sounds normal; no masses,  no organomegaly Extremities: extremities normal, atraumatic, no cyanosis or edema Neurologic: Alert and oriented X 3, normal strength and tone. Normal symmetric reflexes. Normal coordination and gait  ECOG PERFORMANCE STATUS: 1 - Symptomatic but completely ambulatory  Blood pressure (!) 116/55, pulse 91, temperature 98.7 F (37.1 C), temperature source Temporal, resp. rate 16, height 5\' 1"  (1.549 m), weight 78 lb 11.2 oz (35.7 kg), SpO2 100%.  LABORATORY DATA: Lab Results  Component  Value Date   WBC 6.5 11/21/2023   HGB 10.6 (L) 11/21/2023   HCT 32.8 (L) 11/21/2023   MCV 104.5 (H) 11/21/2023   PLT 57 (L) 11/21/2023      Chemistry      Component Value Date/Time   NA 140 11/21/2023 1247   NA 141 09/02/2016 1147   K 3.5 11/21/2023 1247   K 4.5 09/02/2016 1147   CL 106 11/21/2023 1247   CO2 26 11/21/2023 1247   CO2 20 (L) 09/02/2016 1147   BUN 11 11/21/2023 1247   BUN 13.1 09/02/2016 1147   CREATININE 0.63 11/21/2023 1247   CREATININE 0.70 05/11/2023 0930   CREATININE 0.8 09/02/2016 1147      Component Value Date/Time   CALCIUM 8.9 11/21/2023 1247   CALCIUM 9.7 09/02/2016 1147   ALKPHOS 105 11/21/2023 1247   ALKPHOS 77 09/02/2016 1147   AST 10 (L) 11/21/2023 1247   AST 16 09/02/2016 1147   ALT 7 11/21/2023 1247   ALT 9 09/02/2016 1147   BILITOT 0.2 11/21/2023 1247   BILITOT <0.22 09/02/2016 1147       RADIOGRAPHIC STUDIES: CT Chest W Contrast Result Date: 11/18/2023 CLINICAL DATA:  Small-cell lung cancer. Assess treatment response. * Tracking Code: BO * EXAM: CT CHEST WITH CONTRAST TECHNIQUE: Multidetector CT imaging of the chest was performed during intravenous contrast administration. RADIATION DOSE REDUCTION: This exam was performed according to the departmental dose-optimization program which includes automated exposure control, adjustment of the mA and/or kV according to patient size and/or use of iterative reconstruction technique. CONTRAST:  75mL OMNIPAQUE IOHEXOL 300 MG/ML  SOLN COMPARISON:  CT 07/08/2023.  PET-CT scan 07/19/2023. FINDINGS: Cardiovascular: Heart is nonenlarged. Developing small pericardial effusion. Coronary artery calcifications are seen. Please correlate for other coronary risk factors. The thoracic aorta is normal course and caliber with partially calcified mild atherosclerotic plaque. Mediastinum/Nodes: Normal caliber thoracic esophagus. Slight wall thickening along the midthoracic esophagus. Please correlate with any symptoms.  Small thyroid gland. No specific abnormal lymph node enlargement identified in the chest along the supraclavicular regions, axillary regions or left hilum. Right hilar mass, lymph node which measured 2.7 x 2.0 cm previously, today on series 2, image 68 measures 1.7 by 1.2 cm. The abnormal upper right paratracheal node which measured 12 mm in short axis, today on series 2, image 49 has short axis measurement 4 mm. No new mediastinal nodal enlargement. Previously there is some prominent nodes elsewhere in the mediastinum which further decreased. Lungs/Pleura: Linear opacity seen along bases likely scar or atelectasis. Right-greater-than-left. Underlying centrilobular emphysematous changes are identified. Scattered scarring is a subtle thickening. The previous subtle nodule in the middle lobe which measured 5 mm on the previous examination, today is unchanged on series 6, image 113.  No new dominant lung nodule. Upper Abdomen: Low-attenuation nodule again seen left adrenal gland. Again favor adenoma measuring 12 mm. Lesion was not hypermetabolic on PET examination in on the noncontrast CT associated this had Hounsfield units of 3. Preserved right adrenal gland. Fatty liver infiltration. Musculoskeletal: Slight curvature of the thoracic spine. Osteopenia. Old posterior left-sided rib fractures again identified. Some of these are incompletely healed. There is compression deformity of the superior endplate of L2 which has some fragmentation, unchanged from previous examination. Chronic. IMPRESSION: Decreasing size abnormal lymph nodes in the right lung hilum and along the mediastinum. No new mass lesion, fluid collection or lymph node enlargement. Stable 5 mm middle lobe lung nodule. Aortic Atherosclerosis (ICD10-I70.0) and Emphysema (ICD10-J43.9). Electronically Signed   By: Karen Kays M.D.   On: 11/18/2023 14:53   DG ESOPHAGUS W DOUBLE CM (HD) Result Date: 11/16/2023 CLINICAL DATA:  Lung cancer, status post  radiation therapy. Now with difficulty swallowing. EXAM: ESOPHOGRAM / BARIUM SWALLOW / BARIUM TABLET STUDY TECHNIQUE: Combined double contrast and single contrast examination performed using effervescent crystals, thick barium liquid, and thin barium liquid. The patient was observed with fluoroscopy swallowing a 13 mm barium sulphate tablet. FLUOROSCOPY: Radiation Exposure Index (as provided by the fluoroscopic device): 1.2 mGy Kerma COMPARISON:  CT chest 11/14/2023 FINDINGS: At the level of the proximal to mid thoracic esophagus there is a short segment of circumferential luminal narrowing which measures approximately 2 cm in length the patient ingested a 13 mm barium tablet which became lodged at this level and was unable to pass into the distal esophagus despite multiple swallows of water as well as thin barium. The remainder of the esophagus appears normal. Motility of the esophagus was unremarkable. No signs of hiatal hernia or reflux. IMPRESSION: 1. Short segment of stricture involving the proximal to midthoracic esophagus identified which likely accounts for the patient's difficulty swallowing. This may reflect underlying stricture secondary to radiation change. Absence of obvious mass or adenopathy in this area on the recent CT makes malignant stricture less favored. Electronically Signed   By: Signa Kell M.D.   On: 11/16/2023 09:49    ASSESSMENT AND PLAN: This is a very pleasant 68 years old white female with Limited stage small cell lung cancer (T1c, N2, M0) small cell lung cancer. The patient presented with a hilar mass and right paratracheal lymphadenopathy. She was diagnosed in October 2024. The patient is currently on systemic chemotherapy with cisplatin 60 Mg/M2 on day 1 and etoposide 90 Mg/M2 on days 1, 2 and 3 status post 4 cycles.  This was concurrent with radiotherapy for 6 weeks. The patient tolerated her treatment well except for the pancytopenia. She had repeat CT scan of the chest  performed recently.  I personally and independently reviewed the scan and discussed the result with the patient and her daughter.  Her scan showed significant improvement of her disease.    Limited Stage Small Cell Lung Cancer Diagnosed in October 2024. Underwent four cycles of chemotherapy with carboplatin and etoposide, along with concurrent radiation therapy. Recent scan shows significant improvement with decreased size of abnormal lymph nodes in the right lung and no new mass lesions, fluid collections, or lymph node enlargements. Stable 5 mm right middle lobe lesion, likely scarring. Given adverse effects during chemotherapy, immune therapy is not recommended. Discussed benefits of prophylactic cranial irradiation (PCI) to prevent brain metastasis, a common site for small cell lung cancer spread. Decision to consider PCI after recovery. - Order follow-up scan in  three months - Schedule lab work and scan a week before the next visit - Consult Dr. Mitzi Hansen and Revonda Standard regarding PCI  Dysphagia Difficulty swallowing, likely secondary to radiation therapy. Managed with Carafate. Recent barium swallow study performed; EGD deferred by gastroenterologist Dr. Yancey Flemings. - Continue Carafate - Follow up with Dr. Yancey Flemings in the first week of March  Post-Chemotherapy Recovery Blood counts are recovering post-chemotherapy. Hemoglobin is 10.6, platelets are 57,000, and total white blood count is 6.5. Absolute neutrophil count is 4,400. Blood sugar is elevated at 142, but iron studies are normal. - Monitor blood counts and chemistry - Encourage nutritional supplements and pureed foods to aid in weight gain  General Health Maintenance Current weight is 78.7 pounds. Goal is to increase weight to approximately 100 pounds. - Encourage nutritional supplements and pureed foods - Monitor weight gain  Follow-up - Schedule follow-up visit in three months - Perform lab work and scan a week before the next  visit.   The patient was advised to call immediately if she has any concerning symptoms in the interval. The patient voices understanding of current disease status and treatment options and is in agreement with the current care plan.  All questions were answered. The patient knows to call the clinic with any problems, questions or concerns. We can certainly see the patient much sooner if necessary.  The total time spent in the appointment was 30 minutes.  Disclaimer: This note was dictated with voice recognition software. Similar sounding words can inadvertently be transcribed and may not be corrected upon review.

## 2023-11-21 NOTE — Progress Notes (Signed)
Nutrition Follow-up:  Patient with small cell lung cancer.  Patient has completed concurrent chemotherapy and radiation.    Spoke with patient via phone for nutrition follow-up.  Reports that she has narrowing in esophagus but can't have anything done until her platelets come back up.  Reports that she is hungry but has a hard time swallowing solid foods sometimes even liquids.  Drinking 3 ensure plus shakes a day and eating pudding, ice cream, yogurt.      Medications: omeprazole, lidocaine swish, carafate  Labs: reviewed  Anthropometrics:   Weight 77 lb on 1/30 80 lb 8 oz on 1/13 79 lb 1.6 oz on 1/6   NUTRITION DIAGNOSIS: Inadequate oral intake improved    INTERVENTION:  Encouraged increase to 4, 350 calorie shakes a day (1400 calories)  Discussed using nutrabullet/blender to grind foods for ease of swallowing    MONITORING, EVALUATION, GOAL: weight trends, intake   NEXT VISIT: phone call, Monday, Feb 24  Kaedan Richert B. Freida Busman, RD, LDN Registered Dietitian [phone]

## 2023-11-22 ENCOUNTER — Telehealth: Payer: Self-pay | Admitting: Internal Medicine

## 2023-11-22 ENCOUNTER — Telehealth: Payer: Self-pay | Admitting: Radiation Oncology

## 2023-11-22 NOTE — Telephone Encounter (Signed)
 I called the patient to follow up with her. She was unavailable to take my call or leave a voicemail for her. I left her daughter Clotilda a voicemail to discuss reviewing the discussion we started at the beginning of her course with PCI for the brain. I asked Clotilda to call us  back so we could discuss further.

## 2023-11-29 ENCOUNTER — Telehealth: Payer: Self-pay | Admitting: Radiation Oncology

## 2023-11-29 ENCOUNTER — Telehealth: Payer: Self-pay | Admitting: *Deleted

## 2023-11-29 DIAGNOSIS — C3490 Malignant neoplasm of unspecified part of unspecified bronchus or lung: Secondary | ICD-10-CM

## 2023-11-29 NOTE — Telephone Encounter (Signed)
I called and spoke with the patient. She states she is still really struggling with eating. She is only able to tolerate Yogurt, Ice Cream, or applesauce. She struggles as well to swallow most of her pills. We discussed coating them in cooking oil to see if this will help.   We also discussed the rationale for prophylactic cranial irradiation to reduce risks of her small cell lung cancer involving the brain. We discussed whole brain radiation with hippocampal sparing for 10 fractions over 2 weeks, and the side effects short and long term. We also discussed the role of Namenda. She would need an MRI as well for a new baseline assessment. She is in agreement to coordinating the MRI and would like to talk about the radiation with her daughter. I will follow up on these results and contact her. She is aware that while it is not part of NCCN guidelines, the alternative to  proceeding with therapy would be for heightened surveillance with MRI scans ever 3 years for a year, and slowly over time increasing this interval if she stays NED elsewhere.   I also offered CBC with diff the day she comes for her MRI to see if her counts have normalized to revisit her discussion with Dr. Marina Goodell since she is still struggling greatly with her oral intake.

## 2023-11-29 NOTE — Telephone Encounter (Signed)
Called patient to inform of MRI for 11-30-23- arrival time- 9:30 am @ [facility], no restrictions to scan, patient to have labs afterwards @ [facility], spoke with patient and she is aware of these appts. and the instructions

## 2023-11-30 ENCOUNTER — Encounter: Payer: Self-pay | Admitting: Radiation Oncology

## 2023-11-30 ENCOUNTER — Ambulatory Visit (HOSPITAL_COMMUNITY)
Admission: RE | Admit: 2023-11-30 | Discharge: 2023-11-30 | Disposition: A | Discharge status: Home / Self Care | Payer: Medicare Other | Source: Ambulatory Visit | Attending: Radiation Oncology | Admitting: Radiation Oncology

## 2023-11-30 ENCOUNTER — Ambulatory Visit
Admission: RE | Admit: 2023-11-30 | Discharge: 2023-11-30 | Disposition: A | Discharge status: Home / Self Care | Payer: Medicare Other | Source: Ambulatory Visit | Attending: Internal Medicine | Admitting: Internal Medicine

## 2023-11-30 DIAGNOSIS — C3401 Malignant neoplasm of right main bronchus: Secondary | ICD-10-CM | POA: Insufficient documentation

## 2023-11-30 DIAGNOSIS — Z87891 Personal history of nicotine dependence: Secondary | ICD-10-CM | POA: Insufficient documentation

## 2023-11-30 DIAGNOSIS — C3490 Malignant neoplasm of unspecified part of unspecified bronchus or lung: Secondary | ICD-10-CM

## 2023-11-30 DIAGNOSIS — I6782 Cerebral ischemia: Secondary | ICD-10-CM | POA: Diagnosis not present

## 2023-11-30 DIAGNOSIS — G9389 Other specified disorders of brain: Secondary | ICD-10-CM | POA: Diagnosis not present

## 2023-11-30 LAB — CBC WITH DIFFERENTIAL (CANCER CENTER ONLY)
Abs Immature Granulocytes: 0.01 10*3/uL (ref 0.00–0.07)
Basophils Absolute: 0 10*3/uL (ref 0.0–0.1)
Basophils Relative: 0 %
Eosinophils Absolute: 0 10*3/uL (ref 0.0–0.5)
Eosinophils Relative: 1 %
HCT: 32.8 % — ABNORMAL LOW (ref 36.0–46.0)
Hemoglobin: 10.8 g/dL — ABNORMAL LOW (ref 12.0–15.0)
Immature Granulocytes: 0 %
Lymphocytes Relative: 14 %
Lymphs Abs: 0.5 10*3/uL — ABNORMAL LOW (ref 0.7–4.0)
MCH: 34.6 pg — ABNORMAL HIGH (ref 26.0–34.0)
MCHC: 32.9 g/dL (ref 30.0–36.0)
MCV: 105.1 fL — ABNORMAL HIGH (ref 80.0–100.0)
Monocytes Absolute: 0.4 10*3/uL (ref 0.1–1.0)
Monocytes Relative: 11 %
Neutro Abs: 2.5 10*3/uL (ref 1.7–7.7)
Neutrophils Relative %: 74 %
Platelet Count: 114 10*3/uL — ABNORMAL LOW (ref 150–400)
RBC: 3.12 MIL/uL — ABNORMAL LOW (ref 3.87–5.11)
RDW: 20.5 % — ABNORMAL HIGH (ref 11.5–15.5)
WBC Count: 3.4 10*3/uL — ABNORMAL LOW (ref 4.0–10.5)
nRBC: 0 % (ref 0.0–0.2)

## 2023-11-30 LAB — SAMPLE TO BLOOD BANK

## 2023-11-30 LAB — MAGNESIUM: Magnesium: 1.7 mg/dL (ref 1.7–2.4)

## 2023-11-30 MED ORDER — GADOBUTROL 1 MMOL/ML IV SOLN
4.0000 mL | Freq: Once | INTRAVENOUS | Status: AC | PRN
  Administered 2023-11-30: 4 mL via INTRAVENOUS

## 2023-12-06 ENCOUNTER — Telehealth: Payer: Self-pay | Admitting: Radiation Oncology

## 2023-12-06 NOTE — Telephone Encounter (Signed)
I tried to call the patient to review her MRI results and to see how she would like to move forward. She wants a call tomorrow with her daughter as well. I will check in with her at that time.

## 2023-12-07 ENCOUNTER — Telehealth: Payer: Self-pay | Admitting: Radiation Oncology

## 2023-12-07 DIAGNOSIS — C3490 Malignant neoplasm of unspecified part of unspecified bronchus or lung: Secondary | ICD-10-CM

## 2023-12-07 NOTE — Telephone Encounter (Signed)
I spoke with the patient and her daughter about MRI results and options of PCI versus surveillance and she is in favor in MRI brain in 3 months. She is still struggling with her oral intake and is only weighing 79 pounds down from 115# prior to treatment. I encouraged her to continue conversations with Dr. Marina Goodell.

## 2023-12-12 ENCOUNTER — Inpatient Hospital Stay: Payer: Medicare Other

## 2023-12-12 ENCOUNTER — Encounter: Payer: Self-pay | Admitting: Radiation Oncology

## 2023-12-12 NOTE — Progress Notes (Signed)
 Nutrition Follow-up:  Patient with small cell lung cancer.  Patient has completed concurrent chemotherapy and radiation.    Spoke with patient via phone.  Reports that her appetite is good but still can't eat much due to narrowing in esophagus.  Sees GI on 3/10 and will recheck platelet level.  Has been eating soups, milkshakes, ice cream, pudding, yogurt, tuna salad, egg salad, scrambled eggs, cereal with milk.  Drinking 3 ensure plus shakes a day.      Medications: reviewed  Labs: reviewed  Anthropometrics:   Weight 79 lb per patient report 77 lb on 1/30 80 lb 8 oz on 1/13 79 lb 1.6 oz on 1/6   NUTRITION DIAGNOSIS: Inadequate oral intake continues with esophageal narrowing    INTERVENTION:  Recommend patient increase ensure plus to 4 times a day (1400 calories).  Will mail coupons.   Discussed soft, moist foods high in calories and protein.   Can use blender/nutrabullet if needed  MONITORING, EVALUATION, GOAL: weight trends, intake   NEXT VISIT: Monday, March 24 phone call  Keymiah Lyles B. Freida Busman, RD, LDN Registered Dietitian [phone]

## 2023-12-13 ENCOUNTER — Other Ambulatory Visit: Payer: Self-pay

## 2023-12-13 DIAGNOSIS — I10 Essential (primary) hypertension: Secondary | ICD-10-CM

## 2023-12-13 DIAGNOSIS — D509 Iron deficiency anemia, unspecified: Secondary | ICD-10-CM

## 2023-12-13 DIAGNOSIS — E559 Vitamin D deficiency, unspecified: Secondary | ICD-10-CM

## 2023-12-13 DIAGNOSIS — M810 Age-related osteoporosis without current pathological fracture: Secondary | ICD-10-CM

## 2023-12-13 DIAGNOSIS — F17200 Nicotine dependence, unspecified, uncomplicated: Secondary | ICD-10-CM

## 2023-12-13 DIAGNOSIS — Z789 Other specified health status: Secondary | ICD-10-CM

## 2023-12-13 DIAGNOSIS — E440 Moderate protein-calorie malnutrition: Secondary | ICD-10-CM

## 2023-12-13 DIAGNOSIS — F325 Major depressive disorder, single episode, in full remission: Secondary | ICD-10-CM

## 2023-12-13 DIAGNOSIS — I700 Atherosclerosis of aorta: Secondary | ICD-10-CM

## 2023-12-13 DIAGNOSIS — J449 Chronic obstructive pulmonary disease, unspecified: Secondary | ICD-10-CM

## 2023-12-13 DIAGNOSIS — R634 Abnormal weight loss: Secondary | ICD-10-CM

## 2023-12-13 DIAGNOSIS — J441 Chronic obstructive pulmonary disease with (acute) exacerbation: Secondary | ICD-10-CM

## 2023-12-13 DIAGNOSIS — Z131 Encounter for screening for diabetes mellitus: Secondary | ICD-10-CM

## 2023-12-13 DIAGNOSIS — Z0001 Encounter for general adult medical examination with abnormal findings: Secondary | ICD-10-CM

## 2023-12-13 DIAGNOSIS — J4489 Other specified chronic obstructive pulmonary disease: Secondary | ICD-10-CM

## 2023-12-13 DIAGNOSIS — Z1329 Encounter for screening for other suspected endocrine disorder: Secondary | ICD-10-CM

## 2023-12-13 DIAGNOSIS — Z79899 Other long term (current) drug therapy: Secondary | ICD-10-CM

## 2023-12-13 DIAGNOSIS — I6521 Occlusion and stenosis of right carotid artery: Secondary | ICD-10-CM

## 2023-12-13 DIAGNOSIS — Z1389 Encounter for screening for other disorder: Secondary | ICD-10-CM

## 2023-12-13 DIAGNOSIS — R239 Unspecified skin changes: Secondary | ICD-10-CM

## 2023-12-13 DIAGNOSIS — E782 Mixed hyperlipidemia: Secondary | ICD-10-CM

## 2023-12-20 ENCOUNTER — Other Ambulatory Visit: Payer: Medicare Other

## 2023-12-21 ENCOUNTER — Telehealth: Payer: Self-pay | Admitting: Internal Medicine

## 2023-12-21 NOTE — Telephone Encounter (Signed)
 Patient daughter wants to know if her mother will be needing labs draw on the day of her visit. Patient daughter is requesting a call back and stated that if she does not answer we can leave a VM. Please advise.

## 2023-12-22 NOTE — Telephone Encounter (Signed)
 I called back and left a voice mail per pt request that no labs are due prior to the appt.  I asked that she call back if she has any further questions or concerns

## 2023-12-22 NOTE — Telephone Encounter (Signed)
Attempted to reach pt by phone no answer and voice mail full

## 2023-12-22 NOTE — Telephone Encounter (Signed)
 PT daughter called back. She advised she erased vm so that if nurse returns call she can leave a vm

## 2023-12-26 ENCOUNTER — Ambulatory Visit: Payer: Medicare Other | Admitting: Gastroenterology

## 2024-01-03 NOTE — Progress Notes (Unsigned)
 Chief Complaint:follow-up dysphagia and odynophagia. Primary GI Doctor:Dr. 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   On 06/25/2016 patient seen in GI clinic by Amy, PA evaluation of worsening anemia and iron deficiency.  At that time recent EGD and colonoscopy positive for duodenal bulb and pyloric ulcers.  Capsule endoscopy scheduled.  (Full report below).  Patient continued on omeprazole 40 mg twice daily and Integra once daily.   On 11/07/23 patient seen in GI clinic by myself with main complaint of dysphagia and odynophagia.  She was newly diagnosed with limited stage small cell lung cancer back in October of 2024. Patient underwent radiation October- December, 2025 where she had treatment 5 days a week for 6 weeks. She also had chemotherapy which she finished last week.   On 11/21/23 seen by oncologist and per note:  "She was diagnosed with limited stage small cell lung cancer in October 2024 and underwent four cycles of chemotherapy with carboplatin and etoposide, experiencing significant side effects such as low blood counts. Concurrent radiation therapy was also administered.   She currently has difficulty swallowing, which she attributes to the radiation treatment. She is managing this symptom with Carafate. A barium swallow study was conducted last week by her gastroenterologist, but an EGD has been postponed.   She weighs 78.7 pounds and wants to increase her weight, stating she is 'ready to eat solid food.'   Her recent scan shows significant improvement in her cancer, with a decrease in the size of abnormal lymph nodes in the right lung and no new mass lesions, fluid collections, or lymph node  enlargements. A stable 5 mm lesion in the right middle lobe is noted.   Her blood counts are recovering post-chemotherapy, with a hemoglobin level of 10.6, a total white blood count of 6.5, and an absolute neutrophil count of 4400. Her blood sugar is 142, and her iron levels are within normal range.   Discussed benefits of prophylactic cranial irradiation (PCI) to prevent brain metastasis, a common site for small cell lung cancer spread. Decision to consider PCI after recovery. "    Interval History    Patient presents today for follow-up, accompanied by her daughter. She finished  chemotherapy in January for small cell carcinoma of hilum of right lung ,and they are scheduled every 3 months with oncologist for surveillance. They have decided not to pursue preventative radiation to the brain at this time. Patient continues with esophageal dysphagia she has had since mid November, few weeks after she started radiation. She has intermittent episodes of pain with swallowing in throat and mid chest. Patient uses Dukes magic mouth wash up to four times prn. Patient is seeing dietitian for dietary recommendations. Patient currently drinking Ensure three times daily in addition to yogurt, pudding, ice cream, and mashed potatoes. She states sometimes it goes down and other times it doesn't. Sometimes it feels like it sits in her chest for over 30 minutes before passing through. Patient reports bowel movement every other day. No blood in stool. No abdominal pain. Patient has history of GERD and taking Omeprazole 40 mg po twice daily. Patient denies pyrosis.   Wt Readings from Last 3 Encounters:  01/04/24 78 lb (35.4 kg)  11/21/23 78  lb 11.2 oz (35.7 kg)  11/17/23 77 lb 1.3 oz (35 kg)    Past Medical History:  Diagnosis Date   Allergy    Anemia    Arthritis    Asthma    Blood transfusion without reported diagnosis    Cataract    COPD (chronic obstructive pulmonary disease) (HCC)    Duodenal ulcer     Emphysema of lung (HCC)    Family history of malignant neoplasm of gastrointestinal tract    GERD (gastroesophageal reflux disease)    Hepatitis    had in 6th grade pt unsure which kind   Hiatal hernia    HTN (hypertension) 10/03/2014   Hx of colonic polyps    Hyperlipemia    Internal hemorrhoids    Macular degeneration    Osteoporosis    Peripheral vascular disease (HCC)    Pneumonia    hx of   Shortness of breath dyspnea    with ambulation   Small cell carcinoma metastatic to right lung (HCC)    Stroke (HCC)    Trigeminal neuralgia    Vitamin D deficiency     Past Surgical History:  Procedure Laterality Date   ABDOMINAL HYSTERECTOMY     APPENDECTOMY     CATARACT EXTRACTION, BILATERAL     COLONOSCOPY     CRANIOTOMY     for trigeminal neuralgia   ELBOW SURGERY Right    ENDARTERECTOMY Right 12/16/2014   Procedure: ENDARTERECTOMY CAROTID with Dacron patch;  Surgeon: Larina Earthly, MD;  Location: [facility] OR;  Service: Vascular;  Laterality: Right;   ESOPHAGOGASTRODUODENOSCOPY  10/2008   FINE NEEDLE ASPIRATION BIOPSY  07/25/2023   Procedure: FINE NEEDLE ASPIRATION BIOPSY;  Surgeon: Leslye Peer, MD;  Location: [facility] ENDOSCOPY;  Service: Pulmonary;;   HAND SURGERY Left    LUMBAR DISC SURGERY     TOTAL HIP ARTHROPLASTY Right    trigeminal neuralgia surgery     UPPER GASTROINTESTINAL ENDOSCOPY     VIDEO BRONCHOSCOPY WITH ENDOBRONCHIAL ULTRASOUND N/A 07/25/2023   Procedure: VIDEO BRONCHOSCOPY WITH ENDOBRONCHIAL ULTRASOUND;  Surgeon: Leslye Peer, MD;  Location: [facility] ENDOSCOPY;  Service: Pulmonary;  Laterality: N/A;   WRIST SURGERY Left     Current Outpatient Medications  Medication Sig Dispense Refill   albuterol (PROVENTIL) (2.5 MG/3ML) 0.083% nebulizer solution Take 3 mLs (2.5 mg total) by nebulization every 6 (six) hours as needed for wheezing or shortness of breath. 75 mL 12   albuterol (VENTOLIN HFA) 108 (90 Base) MCG/ACT inhaler 2 Inhalations 15 to 20  minutes apart every 4  hours to Rescue Asthma 18 g 3   AMBULATORY NON FORMULARY MEDICATION Medication Name: Magic mouthwash 1 part viscous lidocaine 2% 1 part Maalox 1 part Benadryl 12.5 mg per 5 mL elixir  Swish and swallow 5 cc every 6 hours as needed 300 mL 1   Ascorbic Acid (VITAMIN C) 1000 MG tablet Take 1,000 mg by mouth daily. Takes 7,000mg      Cholecalciferol (VITAMIN D) 50 MCG (2000 UT) CAPS Take by mouth. Takes 6,000 units     FeFum-FePoly-FA-B Cmp-C-Biot (INTEGRA PLUS) CAPS Take 1 capsule by mouth every morning. 30 capsule 2   fluticasone-salmeterol (ADVAIR DISKUS) 250-50 MCG/ACT AEPB Inhale 1 puff into the lungs in the morning and at bedtime. 60 each 1   ipratropium-albuterol (DUONEB) 0.5-2.5 (3) MG/3ML SOLN Take 3 mLs by nebulization every 4 (four) hours as needed. Max:6 doses per day 540 mL 0   magnesium oxide (MAG-OX) 400 (240 Mg) MG  tablet Take 1 tablet (400 mg total) by mouth daily. 30 tablet 0   omeprazole (PRILOSEC) 20 MG capsule TAKE 1 CAPSULE BY MOUTH TWICE DAILY TO PREVENT INDIGESTION & HEARTBURN 180 capsule 1   ondansetron (ZOFRAN) 8 MG tablet TAKE 1 TABLET (8 MG TOTAL) BY MOUTH EVERY 8 (EIGHT) HOURS AS NEEDED FOR NAUSEA OR VOMITING. STARTING 3 DAYS AFTER CHEMOTHERAPY 30 tablet 1   prochlorperazine (COMPAZINE) 10 MG tablet Take 1 tablet (10 mg total) by mouth every 6 (six) hours as needed. 30 tablet 2   sucralfate (CARAFATE) 1 g tablet Take 1 tablet (1 g total) by mouth 4 (four) times daily. Dissolve each tablet in 15 cc water before use. 120 tablet 2   ezetimibe (ZETIA) 10 MG tablet Take 1 tablet (10 mg total) by mouth daily. 30 tablet 11   No current facility-administered medications for this visit.    Allergies as of 01/04/2024 - Review Complete 01/04/2024  Allergen Reaction Noted   Decadron [dexamethasone] Other (See Comments) 10/24/2019   Celebrex [celecoxib] Other (See Comments) 10/15/2013   Crestor [rosuvastatin] Other (See Comments) 12/21/2012   Doxycycline Other (See Comments)  03/18/2022   Penicillins Other (See Comments) 12/13/2008   Pravastatin Other (See Comments) 10/15/2013   Hydrocodone-acetaminophen Hives, Itching, and Rash 03/18/2022    Family History  Problem Relation Age of Onset   Colon polyps Mother    Inflammatory bowel disease Mother    Hypertension Mother    Stroke Mother    Heart disease Mother    Hyperlipidemia Mother    Varicose Veins Mother    Heart attack Mother    AAA (abdominal aortic aneurysm) Mother    Hypertension Father    Hyperlipidemia Father    Heart disease Father    Heart attack Father    Heart disease Brother    Hyperlipidemia Brother    Hypertension Brother    Heart attack Brother    AAA (abdominal aortic aneurysm) Brother    Heart disease Maternal Grandmother    Diabetes Maternal Grandfather    Heart disease Maternal Grandfather    Varicose Veins Daughter    Colon cancer Other        mat great aunt   Esophageal cancer Neg Hx    Stomach cancer Neg Hx    Rectal cancer Neg Hx     Review of Systems:    Constitutional: No weight loss, fever, chills, weakness or fatigue HEENT: Eyes: No change in vision               Ears, Nose, Throat:  No change in hearing or congestion Skin: No rash or itching Cardiovascular: No chest pain, chest pressure or palpitations   Respiratory: No SOB or cough Gastrointestinal: See HPI and otherwise negative Genitourinary: No dysuria or change in urinary frequency Neurological: No headache, dizziness or syncope Musculoskeletal: No new muscle or joint pain Hematologic: No bleeding or bruising Psychiatric: No history of depression or anxiety    Physical Exam:  Vital signs: BP 100/60   Pulse 97   Ht 5\' 1"  (1.549 m)   Wt 78 lb (35.4 kg)   BMI 14.74 kg/m   Constitutional: Pleasant female, thin, frail, alert and cooperative Head:  Normocephalic and atraumatic. Eyes:   PEERL, EOMI. No icterus. Conjunctiva pink. Ears:  Normal auditory acuity. Neck:  Supple Throat: Oral cavity  and pharynx without inflammation, swelling or lesion.  Respiratory: Respirations even and unlabored. Lungs clear to auscultation bilaterally.   No wheezes, crackles, or rhonchi.  Cardiovascular: Normal S1, S2. Regular rate and rhythm. No peripheral edema, cyanosis or pallor.  Gastrointestinal:  Soft, nondistended, nontender. No rebound or guarding. Normal bowel sounds. No appreciable masses or hepatomegaly. Rectal:  Not performed.  Msk:  Symmetrical without gross deformities. Without edema, no deformity or joint abnormality.  Neurologic:  Alert and  oriented x4;  grossly normal neurologically.  Skin:   Dry and intact without significant lesions or rashes. Psychiatric: Oriented to person, place and time. Demonstrates good judgement and reason without abnormal affect or behaviors.  RELEVANT LABS AND IMAGING: CBC    Latest Ref Rng & Units 11/30/2023   10:49 AM 11/21/2023   12:47 PM 11/16/2023    7:46 AM  CBC  WBC 4.0 - 10.5 K/uL 3.4  6.5  1.0   Hemoglobin 12.0 - 15.0 g/dL 52.8  41.3  24.4   Hematocrit 36.0 - 46.0 % 32.8  32.8  31.1   Platelets 150 - 400 K/uL 114  57  38      CMP     Latest Ref Rng & Units 11/21/2023   12:47 PM 11/16/2023    7:46 AM 11/14/2023    9:26 AM  CMP  Glucose 70 - 99 mg/dL 010  98  87   BUN 8 - 23 mg/dL 11  16  13    Creatinine 0.44 - 1.00 mg/dL 2.72  5.36  6.44   Sodium 135 - 145 mmol/L 140  139  141   Potassium 3.5 - 5.1 mmol/L 3.5  4.1  4.4   Chloride 98 - 111 mmol/L 106  103  106   CO2 22 - 32 mmol/L 26  29  28    Calcium 8.9 - 10.3 mg/dL 8.9  9.4  9.5   Total Protein 6.5 - 8.1 g/dL 6.2  6.6  6.9   Total Bilirubin 0.0 - 1.2 mg/dL 0.2  0.3  0.3   Alkaline Phos 38 - 126 U/L 105  78  84   AST 15 - 41 U/L 10  9  11    ALT 0 - 44 U/L 7  6  6       Lab Results  Component Value Date   TSH 1.18 06/09/2023  09/17/2017 Echo- LV EF: 55% -   60%  07/08/16 capsule endoscopy Complete capsule endoscopy done for iron deficiency anemia. Patient has persistent pyloric  inflammation and had a documented prepyloric and duodenal bulb ulcer 12/2015. Complete study good prep.  Pyloric inflammation, no definite ulcer.  Several small erosions scattered throughout small bowel, no active bleeding. 12/31/2015 EGD Impression:  1. Multiple gastric and duodenal ulcers  2. Status post CLO biopsy  3. Status post biopsies for celiac Path: Diagnosis 1. Surgical [P], random sites - BENIGN COLORECTAL MUCOSA. - BENIGN LYMPHOID AGGREGATE PRESENT. - NO EVIDENCE OF SIGNIFICANT INFLAMMATION, DYSPLASIA OR MALIGNANCY. 2. Surgical [P], duodenal - BENIGN SMALL BOWEL MUCOSA. - NO EVIDENCE OF VILLOUS BLUNTING, SIGNIFICANT INFLAMMATION, DYSPLASIA OR MALIGNANCY. 12/31/15 colonoscopy, recall 12/2025 Impression:  - The examined portion of the ileum was normal.  - The entire examined colon is normal on direct and retroflexion views.  - No specimens collected. 07/08/23 CT chest with contrast IMPRESSION: 1. Nodal mass within the right hilum measures 2.7 x 2.0 cm. Findings are concerning for malignancy. Recommend further evaluation with PET-CT and tissue sampling. 2. Prominent right paratracheal lymph node measures 1.2 cm. 3. Unchanged right middle lobe lung nodule measuring 5 mm. 4. Emphysema and bronchial wall thickening. 5. Coronary artery calcifications. 6. Aortic  Atherosclerosis (ICD10-I70.0) and Emphysema (ICD10-J43.9).  11/14/23 CT chest W contrast IMPRESSION: Decreasing size abnormal lymph nodes in the right lung hilum and along the mediastinum. No new mass lesion, fluid collection or lymph node enlargement. Stable 5 mm middle lobe lung nodule. Aortic Atherosclerosis (ICD10-I70.0) and Emphysema (ICD10-J43.9). 11/16/23 ESOPHOGRAM / BARIUM SWALLOW / BARIUM TABLET STUDY  IMPRESSION: 1. Short segment of stricture involving the proximal to midthoracic esophagus identified which likely accounts for the patient's difficulty swallowing. This Keylen Eckenrode reflect underlying  stricture secondary to radiation change. Absence of obvious mass or adenopathy in this area on the recent CT makes malignant stricture less favored.  Assessment: Encounter Diagnoses  Name Primary?   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 Shortly after radiation therapy she started to develop symptoms of esophageal dysphagia and odynophagia. Barium swallow showed stricture involving proximal esophagus. Due to issues with thrombocytopenia endoscopic procedure was put on hold. Pt has completed chemotherapy and continues with weight loss and esophageal dysphagia. Last platelet count on 2/12 was 114, will recheck CBC again today STAT. If stable will proceed with endoscopy with dilatation. She will continue dietary modifications and current medications as is.  Plan: - Reinforce  patient to increase caloric intake and add more flavor to her meals, I would recommend pureing meals.  -Continue Omeprazole 40 mg twice daily -Check CBC with differential STAT. If CBC okay and patient is no longer on radiation, can set up for EGD with possible dilation per Dr. Marina Goodell. -Follow-up with Dr. Marina Goodell or APP after procedure   ADDENDUM: lab work resulted Platelets are 110,hgb 12.4, wbc 5.2  Thank you for the courtesy of this consult. Please call me with any questions or concerns.   Careena Degraffenreid, FNP-C [facility] 01/04/2024, 9:39 AM  Cc: [name], MD

## 2024-01-03 NOTE — H&P (View-Only) (Signed)
 Chief Complaint:follow-up dysphagia and odynophagia. Primary GI Doctor:Dr. 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   On 06/25/2016 patient seen in GI clinic by Amy, PA evaluation of worsening anemia and iron deficiency.  At that time recent EGD and colonoscopy positive for duodenal bulb and pyloric ulcers.  Capsule endoscopy scheduled.  (Full report below).  Patient continued on omeprazole 40 mg twice daily and Integra once daily.   On 11/07/23 patient seen in GI clinic by myself with main complaint of dysphagia and odynophagia.  She was newly diagnosed with limited stage small cell lung cancer back in October of 2024. Patient underwent radiation October- December, 2025 where she had treatment 5 days a week for 6 weeks. She also had chemotherapy which she finished last week.   On 11/21/23 seen by oncologist and per note:  "She was diagnosed with limited stage small cell lung cancer in October 2024 and underwent four cycles of chemotherapy with carboplatin and etoposide, experiencing significant side effects such as low blood counts. Concurrent radiation therapy was also administered.   She currently has difficulty swallowing, which she attributes to the radiation treatment. She is managing this symptom with Carafate. A barium swallow study was conducted last week by her gastroenterologist, but an EGD has been postponed.   She weighs 78.7 pounds and wants to increase her weight, stating she is 'ready to eat solid food.'   Her recent scan shows significant improvement in her cancer, with a decrease in the size of abnormal lymph nodes in the right lung and no new mass lesions, fluid collections, or lymph node  enlargements. A stable 5 mm lesion in the right middle lobe is noted.   Her blood counts are recovering post-chemotherapy, with a hemoglobin level of 10.6, a total white blood count of 6.5, and an absolute neutrophil count of 4400. Her blood sugar is 142, and her iron levels are within normal range.   Discussed benefits of prophylactic cranial irradiation (PCI) to prevent brain metastasis, a common site for small cell lung cancer spread. Decision to consider PCI after recovery. "    Interval History    Patient presents today for follow-up, accompanied by her daughter. She finished  chemotherapy in January for small cell carcinoma of hilum of right lung ,and they are scheduled every 3 months with oncologist for surveillance. They have decided not to pursue preventative radiation to the brain at this time. Patient continues with esophageal dysphagia she has had since mid November, few weeks after she started radiation. She has intermittent episodes of pain with swallowing in throat and mid chest. Patient uses Dukes magic mouth wash up to four times prn. Patient is seeing dietitian for dietary recommendations. Patient currently drinking Ensure three times daily in addition to yogurt, pudding, ice cream, and mashed potatoes. She states sometimes it goes down and other times it doesn't. Sometimes it feels like it sits in her chest for over 30 minutes before passing through. Patient reports bowel movement every other day. No blood in stool. No abdominal pain. Patient has history of GERD and taking Omeprazole 40 mg po twice daily. Patient denies pyrosis.   Wt Readings from Last 3 Encounters:  01/04/24 78 lb (35.4 kg)  11/21/23 78  lb 11.2 oz (35.7 kg)  11/17/23 77 lb 1.3 oz (35 kg)    Past Medical History:  Diagnosis Date   Allergy    Anemia    Arthritis    Asthma    Blood transfusion without reported diagnosis    Cataract    COPD (chronic obstructive pulmonary disease) (HCC)    Duodenal ulcer     Emphysema of lung (HCC)    Family history of malignant neoplasm of gastrointestinal tract    GERD (gastroesophageal reflux disease)    Hepatitis    had in 6th grade pt unsure which kind   Hiatal hernia    HTN (hypertension) 10/03/2014   Hx of colonic polyps    Hyperlipemia    Internal hemorrhoids    Macular degeneration    Osteoporosis    Peripheral vascular disease (HCC)    Pneumonia    hx of   Shortness of breath dyspnea    with ambulation   Small cell carcinoma metastatic to right lung (HCC)    Stroke (HCC)    Trigeminal neuralgia    Vitamin D deficiency     Past Surgical History:  Procedure Laterality Date   ABDOMINAL HYSTERECTOMY     APPENDECTOMY     CATARACT EXTRACTION, BILATERAL     COLONOSCOPY     CRANIOTOMY     for trigeminal neuralgia   ELBOW SURGERY Right    ENDARTERECTOMY Right 12/16/2014   Procedure: ENDARTERECTOMY CAROTID with Dacron patch;  Surgeon: Larina Earthly, MD;  Location: [facility] OR;  Service: Vascular;  Laterality: Right;   ESOPHAGOGASTRODUODENOSCOPY  10/2008   FINE NEEDLE ASPIRATION BIOPSY  07/25/2023   Procedure: FINE NEEDLE ASPIRATION BIOPSY;  Surgeon: Leslye Peer, MD;  Location: [facility] ENDOSCOPY;  Service: Pulmonary;;   HAND SURGERY Left    LUMBAR DISC SURGERY     TOTAL HIP ARTHROPLASTY Right    trigeminal neuralgia surgery     UPPER GASTROINTESTINAL ENDOSCOPY     VIDEO BRONCHOSCOPY WITH ENDOBRONCHIAL ULTRASOUND N/A 07/25/2023   Procedure: VIDEO BRONCHOSCOPY WITH ENDOBRONCHIAL ULTRASOUND;  Surgeon: Leslye Peer, MD;  Location: [facility] ENDOSCOPY;  Service: Pulmonary;  Laterality: N/A;   WRIST SURGERY Left     Current Outpatient Medications  Medication Sig Dispense Refill   albuterol (PROVENTIL) (2.5 MG/3ML) 0.083% nebulizer solution Take 3 mLs (2.5 mg total) by nebulization every 6 (six) hours as needed for wheezing or shortness of breath. 75 mL 12   albuterol (VENTOLIN HFA) 108 (90 Base) MCG/ACT inhaler 2 Inhalations 15 to 20  minutes apart every 4  hours to Rescue Asthma 18 g 3   AMBULATORY NON FORMULARY MEDICATION Medication Name: Magic mouthwash 1 part viscous lidocaine 2% 1 part Maalox 1 part Benadryl 12.5 mg per 5 mL elixir  Swish and swallow 5 cc every 6 hours as needed 300 mL 1   Ascorbic Acid (VITAMIN C) 1000 MG tablet Take 1,000 mg by mouth daily. Takes 7,000mg      Cholecalciferol (VITAMIN D) 50 MCG (2000 UT) CAPS Take by mouth. Takes 6,000 units     FeFum-FePoly-FA-B Cmp-C-Biot (INTEGRA PLUS) CAPS Take 1 capsule by mouth every morning. 30 capsule 2   fluticasone-salmeterol (ADVAIR DISKUS) 250-50 MCG/ACT AEPB Inhale 1 puff into the lungs in the morning and at bedtime. 60 each 1   ipratropium-albuterol (DUONEB) 0.5-2.5 (3) MG/3ML SOLN Take 3 mLs by nebulization every 4 (four) hours as needed. Max:6 doses per day 540 mL 0   magnesium oxide (MAG-OX) 400 (240 Mg) MG  tablet Take 1 tablet (400 mg total) by mouth daily. 30 tablet 0   omeprazole (PRILOSEC) 20 MG capsule TAKE 1 CAPSULE BY MOUTH TWICE DAILY TO PREVENT INDIGESTION & HEARTBURN 180 capsule 1   ondansetron (ZOFRAN) 8 MG tablet TAKE 1 TABLET (8 MG TOTAL) BY MOUTH EVERY 8 (EIGHT) HOURS AS NEEDED FOR NAUSEA OR VOMITING. STARTING 3 DAYS AFTER CHEMOTHERAPY 30 tablet 1   prochlorperazine (COMPAZINE) 10 MG tablet Take 1 tablet (10 mg total) by mouth every 6 (six) hours as needed. 30 tablet 2   sucralfate (CARAFATE) 1 g tablet Take 1 tablet (1 g total) by mouth 4 (four) times daily. Dissolve each tablet in 15 cc water before use. 120 tablet 2   ezetimibe (ZETIA) 10 MG tablet Take 1 tablet (10 mg total) by mouth daily. 30 tablet 11   No current facility-administered medications for this visit.    Allergies as of 01/04/2024 - Review Complete 01/04/2024  Allergen Reaction Noted   Decadron [dexamethasone] Other (See Comments) 10/24/2019   Celebrex [celecoxib] Other (See Comments) 10/15/2013   Crestor [rosuvastatin] Other (See Comments) 12/21/2012   Doxycycline Other (See Comments)  03/18/2022   Penicillins Other (See Comments) 12/13/2008   Pravastatin Other (See Comments) 10/15/2013   Hydrocodone-acetaminophen Hives, Itching, and Rash 03/18/2022    Family History  Problem Relation Age of Onset   Colon polyps Mother    Inflammatory bowel disease Mother    Hypertension Mother    Stroke Mother    Heart disease Mother    Hyperlipidemia Mother    Varicose Veins Mother    Heart attack Mother    AAA (abdominal aortic aneurysm) Mother    Hypertension Father    Hyperlipidemia Father    Heart disease Father    Heart attack Father    Heart disease Brother    Hyperlipidemia Brother    Hypertension Brother    Heart attack Brother    AAA (abdominal aortic aneurysm) Brother    Heart disease Maternal Grandmother    Diabetes Maternal Grandfather    Heart disease Maternal Grandfather    Varicose Veins Daughter    Colon cancer Other        mat great aunt   Esophageal cancer Neg Hx    Stomach cancer Neg Hx    Rectal cancer Neg Hx     Review of Systems:    Constitutional: No weight loss, fever, chills, weakness or fatigue HEENT: Eyes: No change in vision               Ears, Nose, Throat:  No change in hearing or congestion Skin: No rash or itching Cardiovascular: No chest pain, chest pressure or palpitations   Respiratory: No SOB or cough Gastrointestinal: See HPI and otherwise negative Genitourinary: No dysuria or change in urinary frequency Neurological: No headache, dizziness or syncope Musculoskeletal: No new muscle or joint pain Hematologic: No bleeding or bruising Psychiatric: No history of depression or anxiety    Physical Exam:  Vital signs: BP 100/60   Pulse 97   Ht 5\' 1"  (1.549 m)   Wt 78 lb (35.4 kg)   BMI 14.74 kg/m   Constitutional: Pleasant female, thin, frail, alert and cooperative Head:  Normocephalic and atraumatic. Eyes:   PEERL, EOMI. No icterus. Conjunctiva pink. Ears:  Normal auditory acuity. Neck:  Supple Throat: Oral cavity  and pharynx without inflammation, swelling or lesion.  Respiratory: Respirations even and unlabored. Lungs clear to auscultation bilaterally.   No wheezes, crackles, or rhonchi.  Cardiovascular: Normal S1, S2. Regular rate and rhythm. No peripheral edema, cyanosis or pallor.  Gastrointestinal:  Soft, nondistended, nontender. No rebound or guarding. Normal bowel sounds. No appreciable masses or hepatomegaly. Rectal:  Not performed.  Msk:  Symmetrical without gross deformities. Without edema, no deformity or joint abnormality.  Neurologic:  Alert and  oriented x4;  grossly normal neurologically.  Skin:   Dry and intact without significant lesions or rashes. Psychiatric: Oriented to person, place and time. Demonstrates good judgement and reason without abnormal affect or behaviors.  RELEVANT LABS AND IMAGING: CBC    Latest Ref Rng & Units 11/30/2023   10:49 AM 11/21/2023   12:47 PM 11/16/2023    7:46 AM  CBC  WBC 4.0 - 10.5 K/uL 3.4  6.5  1.0   Hemoglobin 12.0 - 15.0 g/dL 52.8  41.3  24.4   Hematocrit 36.0 - 46.0 % 32.8  32.8  31.1   Platelets 150 - 400 K/uL 114  57  38      CMP     Latest Ref Rng & Units 11/21/2023   12:47 PM 11/16/2023    7:46 AM 11/14/2023    9:26 AM  CMP  Glucose 70 - 99 mg/dL 010  98  87   BUN 8 - 23 mg/dL 11  16  13    Creatinine 0.44 - 1.00 mg/dL 2.72  5.36  6.44   Sodium 135 - 145 mmol/L 140  139  141   Potassium 3.5 - 5.1 mmol/L 3.5  4.1  4.4   Chloride 98 - 111 mmol/L 106  103  106   CO2 22 - 32 mmol/L 26  29  28    Calcium 8.9 - 10.3 mg/dL 8.9  9.4  9.5   Total Protein 6.5 - 8.1 g/dL 6.2  6.6  6.9   Total Bilirubin 0.0 - 1.2 mg/dL 0.2  0.3  0.3   Alkaline Phos 38 - 126 U/L 105  78  84   AST 15 - 41 U/L 10  9  11    ALT 0 - 44 U/L 7  6  6       Lab Results  Component Value Date   TSH 1.18 06/09/2023  09/17/2017 Echo- LV EF: 55% -   60%  07/08/16 capsule endoscopy Complete capsule endoscopy done for iron deficiency anemia. Patient has persistent pyloric  inflammation and had a documented prepyloric and duodenal bulb ulcer 12/2015. Complete study good prep.  Pyloric inflammation, no definite ulcer.  Several small erosions scattered throughout small bowel, no active bleeding. 12/31/2015 EGD Impression:  1. Multiple gastric and duodenal ulcers  2. Status post CLO biopsy  3. Status post biopsies for celiac Path: Diagnosis 1. Surgical [P], random sites - BENIGN COLORECTAL MUCOSA. - BENIGN LYMPHOID AGGREGATE PRESENT. - NO EVIDENCE OF SIGNIFICANT INFLAMMATION, DYSPLASIA OR MALIGNANCY. 2. Surgical [P], duodenal - BENIGN SMALL BOWEL MUCOSA. - NO EVIDENCE OF VILLOUS BLUNTING, SIGNIFICANT INFLAMMATION, DYSPLASIA OR MALIGNANCY. 12/31/15 colonoscopy, recall 12/2025 Impression:  - The examined portion of the ileum was normal.  - The entire examined colon is normal on direct and retroflexion views.  - No specimens collected. 07/08/23 CT chest with contrast IMPRESSION: 1. Nodal mass within the right hilum measures 2.7 x 2.0 cm. Findings are concerning for malignancy. Recommend further evaluation with PET-CT and tissue sampling. 2. Prominent right paratracheal lymph node measures 1.2 cm. 3. Unchanged right middle lobe lung nodule measuring 5 mm. 4. Emphysema and bronchial wall thickening. 5. Coronary artery calcifications. 6. Aortic  Atherosclerosis (ICD10-I70.0) and Emphysema (ICD10-J43.9).  11/14/23 CT chest W contrast IMPRESSION: Decreasing size abnormal lymph nodes in the right lung hilum and along the mediastinum. No new mass lesion, fluid collection or lymph node enlargement. Stable 5 mm middle lobe lung nodule. Aortic Atherosclerosis (ICD10-I70.0) and Emphysema (ICD10-J43.9). 11/16/23 ESOPHOGRAM / BARIUM SWALLOW / BARIUM TABLET STUDY  IMPRESSION: 1. Short segment of stricture involving the proximal to midthoracic esophagus identified which likely accounts for the patient's difficulty swallowing. This Keylen Eckenrode reflect underlying  stricture secondary to radiation change. Absence of obvious mass or adenopathy in this area on the recent CT makes malignant stricture less favored.  Assessment: Encounter Diagnoses  Name Primary?   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 Shortly after radiation therapy she started to develop symptoms of esophageal dysphagia and odynophagia. Barium swallow showed stricture involving proximal esophagus. Due to issues with thrombocytopenia endoscopic procedure was put on hold. Pt has completed chemotherapy and continues with weight loss and esophageal dysphagia. Last platelet count on 2/12 was 114, will recheck CBC again today STAT. If stable will proceed with endoscopy with dilatation. She will continue dietary modifications and current medications as is.  Plan: - Reinforce  patient to increase caloric intake and add more flavor to her meals, I would recommend pureing meals.  -Continue Omeprazole 40 mg twice daily -Check CBC with differential STAT. If CBC okay and patient is no longer on radiation, can set up for EGD with possible dilation per Dr. Marina Goodell. -Follow-up with Dr. Marina Goodell or APP after procedure   ADDENDUM: lab work resulted Platelets are 110,hgb 12.4, wbc 5.2  Thank you for the courtesy of this consult. Please call me with any questions or concerns.   Careena Degraffenreid, FNP-C [facility] 01/04/2024, 9:39 AM  Cc: [name], MD

## 2024-01-04 ENCOUNTER — Other Ambulatory Visit: Payer: Self-pay

## 2024-01-04 ENCOUNTER — Other Ambulatory Visit

## 2024-01-04 ENCOUNTER — Encounter: Payer: Self-pay | Admitting: Gastroenterology

## 2024-01-04 ENCOUNTER — Ambulatory Visit: Admitting: Gastroenterology

## 2024-01-04 VITALS — BP 100/60 | HR 97 | Ht 61.0 in | Wt 78.0 lb

## 2024-01-04 DIAGNOSIS — C3401 Malignant neoplasm of right main bronchus: Secondary | ICD-10-CM

## 2024-01-04 DIAGNOSIS — R1319 Other dysphagia: Secondary | ICD-10-CM

## 2024-01-04 DIAGNOSIS — K219 Gastro-esophageal reflux disease without esophagitis: Secondary | ICD-10-CM

## 2024-01-04 DIAGNOSIS — K222 Esophageal obstruction: Secondary | ICD-10-CM | POA: Diagnosis not present

## 2024-01-04 DIAGNOSIS — J449 Chronic obstructive pulmonary disease, unspecified: Secondary | ICD-10-CM

## 2024-01-04 DIAGNOSIS — R634 Abnormal weight loss: Secondary | ICD-10-CM

## 2024-01-04 DIAGNOSIS — R1310 Dysphagia, unspecified: Secondary | ICD-10-CM | POA: Diagnosis not present

## 2024-01-04 LAB — CBC WITH DIFFERENTIAL/PLATELET
Basophils Absolute: 0 10*3/uL (ref 0.0–0.1)
Basophils Relative: 0.9 % (ref 0.0–3.0)
Eosinophils Absolute: 0.1 10*3/uL (ref 0.0–0.7)
Eosinophils Relative: 2.3 % (ref 0.0–5.0)
HCT: 36.5 % (ref 36.0–46.0)
Hemoglobin: 12.4 g/dL (ref 12.0–15.0)
Lymphocytes Relative: 13.8 % (ref 12.0–46.0)
Lymphs Abs: 0.7 10*3/uL (ref 0.7–4.0)
MCHC: 34 g/dL (ref 30.0–36.0)
MCV: 110.8 fl — ABNORMAL HIGH (ref 78.0–100.0)
Monocytes Absolute: 0.4 10*3/uL (ref 0.1–1.0)
Monocytes Relative: 7.9 % (ref 3.0–12.0)
Neutro Abs: 3.9 10*3/uL (ref 1.4–7.7)
Neutrophils Relative %: 75.1 % (ref 43.0–77.0)
Platelets: 110 10*3/uL — ABNORMAL LOW (ref 150.0–400.0)
RBC: 3.29 Mil/uL — ABNORMAL LOW (ref 3.87–5.11)
RDW: 19.2 % — ABNORMAL HIGH (ref 11.5–15.5)
WBC: 5.2 10*3/uL (ref 4.0–10.5)

## 2024-01-04 MED ORDER — OMEPRAZOLE 40 MG PO CPDR: 40.0000 mg | DELAYED_RELEASE_CAPSULE | Freq: Two times a day (BID) | ORAL | 3 refills | Status: DC

## 2024-01-04 NOTE — Patient Instructions (Addendum)
 Your provider has requested that you go to the basement level for lab work before leaving today. Press "B" on the elevator. The lab is located at the first door on the left as you exit the elevator.  Our office will call to schedule procedure.  _______________________________________________________  If your blood pressure at your visit was 140/90 or greater, please contact your primary care physician to follow up on this.  _______________________________________________________  If you are age 66 or older, your body mass index should be between 23-30. Your Body mass index is 14.74 kg/m. If this is out of the aforementioned range listed, please consider follow up with your Primary Care Provider.  If you are age 4 or younger, your body mass index should be between 19-25. Your Body mass index is 14.74 kg/m. If this is out of the aformentioned range listed, please consider follow up with your Primary Care Provider.   ________________________________________________________  The  GI providers would like to encourage you to use [facility] to communicate with providers for non-urgent requests or questions.  Due to long hold times on the telephone, sending your provider a message by [facility] may be a faster and more efficient way to get a response.  Please allow 48 business hours for a response.  Please remember that this is for non-urgent requests.  _______________________________________________________  Thank you for trusting me with your gastrointestinal care!   Margarite Gouge May, NP

## 2024-01-04 NOTE — Progress Notes (Signed)
 Reviewed. Agree with EGD with dilation for symptomatic esophageal stricture, likely radiation-induced.

## 2024-01-09 ENCOUNTER — Inpatient Hospital Stay: Payer: Medicare Other | Attending: Physician Assistant

## 2024-01-09 NOTE — Progress Notes (Signed)
 Nutrition Follow-up:  Patient with small cell lung cancer.  Patient has completed concurrent chemotherapy and radiation. Met with GI and planning EGD on 4/4 to stretch esophagus  Spoke with patient via phone.  Reports that she is drinking 3-4 ensure plus per day.  Also eating peaches and cottage cheese, egg salad, mashed potatoes, yogurt, broth, ice cream, milkshakes. Says that orange juice burns going down.      Medications: omeprazole and Vit D  Labs: reviewed  Anthropometrics:   Weight 78 lb  79 lb on 2/24 77 lb on 1/30 80 lb 8 oz on 1/13 79 lb 1.6 oz on 1/6   NUTRITION DIAGNOSIS: Inadequate oral intake continues with esophageal narrowing    INTERVENTION:  Continue ensure plus 4 times a day (1400 calories) Continue high calorie, soft, moist foods high in calories and protein.  Can use blender/nutrabullet if needed to grind/puree foods     MONITORING, EVALUATION, GOAL: weight trends, intake   NEXT VISIT: Monday, April 21 phone call  Starlina Lapre B. Elease Hashimoto, CSO, LDN Registered Dietitian [phone]

## 2024-01-12 ENCOUNTER — Encounter: Payer: Self-pay | Admitting: Gastroenterology

## 2024-01-20 ENCOUNTER — Ambulatory Visit: Admitting: Gastroenterology

## 2024-01-20 ENCOUNTER — Telehealth: Payer: Self-pay

## 2024-01-20 ENCOUNTER — Encounter: Payer: Self-pay | Admitting: Gastroenterology

## 2024-01-20 ENCOUNTER — Other Ambulatory Visit: Payer: Self-pay

## 2024-01-20 VITALS — BP 111/54 | HR 115 | Temp 97.2°F | Resp 22 | Ht 61.0 in | Wt 78.0 lb

## 2024-01-20 DIAGNOSIS — K222 Esophageal obstruction: Secondary | ICD-10-CM | POA: Diagnosis not present

## 2024-01-20 DIAGNOSIS — K219 Gastro-esophageal reflux disease without esophagitis: Secondary | ICD-10-CM | POA: Diagnosis not present

## 2024-01-20 DIAGNOSIS — R1319 Other dysphagia: Secondary | ICD-10-CM | POA: Insufficient documentation

## 2024-01-20 DIAGNOSIS — R634 Abnormal weight loss: Secondary | ICD-10-CM

## 2024-01-20 DIAGNOSIS — R1310 Dysphagia, unspecified: Secondary | ICD-10-CM

## 2024-01-20 DIAGNOSIS — K2289 Other specified disease of esophagus: Secondary | ICD-10-CM | POA: Diagnosis not present

## 2024-01-20 DIAGNOSIS — K209 Esophagitis, unspecified without bleeding: Secondary | ICD-10-CM | POA: Diagnosis not present

## 2024-01-20 MED ORDER — SODIUM CHLORIDE 0.9 % IV SOLN: 500.0000 mL | Freq: Once | INTRAVENOUS | Status: DC

## 2024-01-20 NOTE — Op Note (Signed)
 [facility] Patient Name: [name] Procedure Date: 01/20/2024 10:06 AM MRN: [id number] Endoscopist: Doristine Locks , MD, [id number] Age: 68 Referring MD:  Date of Birth: 12/02/1955 Gender: Female Account #: [id number] Procedure:                Upper GI endoscopy Indications:              Dysphagia, Odynophagia, Abnormal cine-esophagram Medicines:                Monitored Anesthesia Care Procedure:                Pre-Anesthesia Assessment:                           - Prior to the procedure, a History and Physical                            was performed, and patient medications and                            allergies were reviewed. The patient's tolerance of                            previous anesthesia was also reviewed. The risks                            and benefits of the procedure and the sedation                            options and risks were discussed with the patient.                            All questions were answered, and informed consent                            was obtained. Prior Anticoagulants: The patient has                            taken no anticoagulant or antiplatelet agents. ASA                            Grade Assessment: III - A patient with severe                            systemic disease. After reviewing the risks and                            benefits, the patient was deemed in satisfactory                            condition to undergo the procedure.                           After obtaining informed consent, the endoscope was  passed under direct vision. Throughout the                            procedure, the patient's blood pressure, pulse, and                            oxygen saturations were monitored continuously. The                            Olympus Scope [id number] was introduced through the                            mouth, with the intention of advancing to the                             duodenum. The scope was advanced to the upper third                            of the esophagus before the procedure was aborted.                            Medications were given. The procedure was aborted                            due to severe esophageal stenosis. Scope In: Scope Out: Findings:                 One severe stenosis was found 22 cm from the                            incisors. This stenosis measured 3-4 mm (inner                            diameter). The stenosis was not traversed. Biopsies                            were taken with a cold forceps for histology adn to                            attempt to debride tissue, but still unable to                            traverse with the standard endoscope. Estimated                            blood loss was minimal. Complications:            No immediate complications. Estimated Blood Loss:     Estimated blood loss was minimal. Impression:               - The procedure was aborted due to severe                            esophageal stenosis.                           -  Severe esophageal stenosis, suspect secondary to                            radiation. This was non-traversable. This was                            biopsied. Recommendation:           - Patient has a contact number available for                            emergencies. The signs and symptoms of potential                            delayed complications were discussed with the                            patient. Return to normal activities tomorrow.                            Written discharge instructions were provided to the                            patient.                           - Full liquid diet.                           - Continue present medications.                           - Await pathology results.                           - Repeat upper endoscopy at the next available                            appointment to be scheduled at the hospital                             endoscopy unit with plan for ultraslim endoscope                            and wire guided balloon dilation. Doristine Locks, MD 01/20/2024 10:37:07 AM

## 2024-01-20 NOTE — Progress Notes (Signed)
 Called to room to assist during endoscopic procedure.  Patient ID and intended procedure confirmed with present staff. Received instructions for my participation in the procedure from the performing physician.

## 2024-01-20 NOTE — Progress Notes (Signed)
 Pt's states no medical or surgical changes since previsit or office visit.

## 2024-01-20 NOTE — Telephone Encounter (Signed)
 Pt scheduled for egd with savory dilation /fluro at [facility] 01/26/24 @11am , pt to arrive there at 9:30am. Pt to be NPO after midnight. Left message for pts daughter. Will call again Monday.

## 2024-01-20 NOTE — Telephone Encounter (Signed)
-----   Message from Jenel Lucks sent at 01/20/2024  4:31 PM EDT ----- Rip Harbour to schedule, thanks ----- Message ----- From: Chrystie Nose, RN Sent: 01/20/2024   4:25 PM EDT To: Hilarie Fredrickson, MD; Shellia Cleverly, DO; #  Dr. Marina Goodell your next spot is 03/28/24.  Dr. Tomasa Rand has a slot at [facility] on 01/27/24.   All Please advise if ok to schedule. ----- Message ----- From: Hilarie Fredrickson, MD Sent: 01/20/2024  12:36 PM EDT To: Hilarie Fredrickson, MD; Chrystie Nose, RN; #  Thanks Gae Bon.  [place] Sigmon, Set up at hospital for [facility] dilation with fluoroscopy. Her stricture is severe, so if I am out a while for spots, lets look for others who have sooner availability, to help her. Thanks, JP ----- Message ----- From: Shellia Cleverly, DO Sent: 01/20/2024  10:30 AM EDT To: Hilarie Fredrickson, MD; Margarite Gouge May, NP  Attempted EGD on this patient today.  Unfortunately she has a pretty severe stricture (suspect radiation-induced stricture) starting at 22 cm from the incisors, measuring about 3-4 mm in diameter.  Unable to traverse.  Was not sure how long the stricture is, but could not see the other side clearly, so did not proceed with blind balloon dilatation.  Will need to reschedule at the hospital Endo unit with baby scope.  I took some biopsies, but could not debride enough of the tissue away to allow endoscope passage.   Sorry, wish I could have gotten through to start her serial dilations.   VC

## 2024-01-20 NOTE — Progress Notes (Signed)
 Sedate, gd SR, tolerated procedure well, VSS, report to RN

## 2024-01-20 NOTE — H&P (View-Only) (Signed)
 GASTROENTEROLOGY PROCEDURE H&P NOTE   Primary Care Physician: [name], MD    Reason for Procedure:  Dysphagia, odynophagia, stricture on barium esophagram  Plan:    EGD  Patient is appropriate for endoscopic procedure(s) in the ambulatory ([facility]) setting.  The nature of the procedure, as well as the risks, benefits, and alternatives were carefully and thoroughly reviewed with the patient. Ample time for discussion and questions allowed. The patient understood, was satisfied, and agreed to proceed.     HPI: [name] is a 68 y.o. female who presents for EGD with dilation and/or biopsy for evaluation of esophageal dysphagia, odynophagia, and recent barium esophagram showing stricture in the proximal esophagus in the setting of recent radiation and chemotherapy for small cell carcinoma of the right lung.  Patient was most recently seen in the Gastroenterology Clinic on 01/04/2024.  No interval change in medical history since that appointment. Please refer to that note for full details regarding GI history and clinical presentation.   Most recent CBC from 3/19 with PLT 110.  Past Medical History:  Diagnosis Date   Allergy    Anemia    Arthritis    Asthma    Blood transfusion without reported diagnosis    Cataract    COPD (chronic obstructive pulmonary disease) (HCC)    Duodenal ulcer    Emphysema of lung (HCC)    Family history of malignant neoplasm of gastrointestinal tract    GERD (gastroesophageal reflux disease)    Hepatitis    had in 6th grade pt unsure which kind   Hiatal hernia    HTN (hypertension) 10/03/2014   Hx of colonic polyps    Hyperlipemia    Internal hemorrhoids    Macular degeneration    Osteoporosis    Peripheral vascular disease (HCC)    Pneumonia    hx of   Shortness of breath dyspnea    with ambulation   Small cell carcinoma metastatic to right lung (HCC)    Stroke (HCC)    Trigeminal neuralgia    Vitamin D deficiency      Past Surgical History:  Procedure Laterality Date   ABDOMINAL HYSTERECTOMY     APPENDECTOMY     CATARACT EXTRACTION, BILATERAL     COLONOSCOPY     CRANIOTOMY     for trigeminal neuralgia   ELBOW SURGERY Right    ENDARTERECTOMY Right 12/16/2014   Procedure: ENDARTERECTOMY CAROTID with Dacron patch;  Surgeon: Larina Earthly, MD;  Location: [facility] OR;  Service: Vascular;  Laterality: Right;   ESOPHAGOGASTRODUODENOSCOPY  10/2008   FINE NEEDLE ASPIRATION BIOPSY  07/25/2023   Procedure: FINE NEEDLE ASPIRATION BIOPSY;  Surgeon: Leslye Peer, MD;  Location: [facility] ENDOSCOPY;  Service: Pulmonary;;   HAND SURGERY Left    LUMBAR DISC SURGERY     TOTAL HIP ARTHROPLASTY Right    trigeminal neuralgia surgery     UPPER GASTROINTESTINAL ENDOSCOPY     VIDEO BRONCHOSCOPY WITH ENDOBRONCHIAL ULTRASOUND N/A 07/25/2023   Procedure: VIDEO BRONCHOSCOPY WITH ENDOBRONCHIAL ULTRASOUND;  Surgeon: Leslye Peer, MD;  Location: [facility] ENDOSCOPY;  Service: Pulmonary;  Laterality: N/A;   WRIST SURGERY Left     Prior to Admission medications   Medication Sig Start Date End Date Taking? Authorizing Provider  albuterol (PROVENTIL) (2.5 MG/3ML) 0.083% nebulizer solution Take 3 mLs (2.5 mg total) by nebulization every 6 (six) hours as needed for wheezing or shortness of breath. 06/13/23  Yes Cranford, Tonya, NP  albuterol (VENTOLIN HFA) 108 (90  Base) MCG/ACT inhaler 2 Inhalations 15 to 20  minutes apart every 4 hours to Rescue Asthma 05/11/23  Yes Cranford, Tonya, NP  ipratropium-albuterol (DUONEB) 0.5-2.5 (3) MG/3ML SOLN Take 3 mLs by nebulization every 4 (four) hours as needed. Max:6 doses per day 05/11/23  Yes Cranford, Archie Patten, NP  omeprazole (PRILOSEC) 40 MG capsule Take 1 capsule (40 mg total) by mouth 2 (two) times daily before a meal. 01/04/24  Yes May, Deanna J, NP  ondansetron (ZOFRAN) 8 MG tablet TAKE 1 TABLET (8 MG TOTAL) BY MOUTH EVERY 8 (EIGHT) HOURS AS NEEDED FOR NAUSEA OR VOMITING. STARTING 3 DAYS AFTER CHEMOTHERAPY  10/18/23  Yes Heilingoetter, Cassandra L, PA-C  AMBULATORY NON FORMULARY MEDICATION Medication Name: Magic mouthwash 1 part viscous lidocaine 2% 1 part Maalox 1 part Benadryl 12.5 mg per 5 mL elixir  Swish and swallow 5 cc every 6 hours as needed Patient not taking: Reported on 01/20/2024 11/07/23   May, Deanna J, NP  Ascorbic Acid (VITAMIN C) 1000 MG tablet Take 1,000 mg by mouth daily. Takes 7,000mg  Patient not taking: Reported on 01/20/2024    [provider]  Cholecalciferol (VITAMIN D) 50 MCG (2000 UT) CAPS Take by mouth. Takes 6,000 units Patient not taking: Reported on 01/20/2024    [provider]  ezetimibe (ZETIA) 10 MG tablet Take 1 tablet (10 mg total) by mouth daily. Patient not taking: Reported on 01/20/2024 03/18/22 03/18/23  [name], NP  FeFum-FePoly-FA-B Cmp-C-Biot (INTEGRA PLUS) CAPS Take 1 capsule by mouth every morning. Patient not taking: Reported on 01/20/2024 08/02/23   Heilingoetter, Cassandra L, PA-C  fluticasone-salmeterol (ADVAIR DISKUS) 250-50 MCG/ACT AEPB Inhale 1 puff into the lungs in the morning and at bedtime. Patient not taking: Reported on 01/20/2024 03/18/22   [name], NP  magnesium oxide (MAG-OX) 400 (240 Mg) MG tablet Take 1 tablet (400 mg total) by mouth daily. Patient not taking: Reported on 01/20/2024 10/10/23   Heilingoetter, Cassandra L, PA-C  prochlorperazine (COMPAZINE) 10 MG tablet Take 1 tablet (10 mg total) by mouth every 6 (six) hours as needed. 08/01/23   Heilingoetter, Cassandra L, PA-C  sucralfate (CARAFATE) 1 g tablet Take 1 tablet (1 g total) by mouth 4 (four) times daily. Dissolve each tablet in 15 cc water before use. Patient not taking: Reported on 01/20/2024 08/19/23   Dorothy Puffer, MD    Current Outpatient Medications  Medication Sig Dispense Refill   albuterol (PROVENTIL) (2.5 MG/3ML) 0.083% nebulizer solution Take 3 mLs (2.5 mg total) by nebulization every 6 (six) hours as needed for wheezing or shortness of breath.  75 mL 12   albuterol (VENTOLIN HFA) 108 (90 Base) MCG/ACT inhaler 2 Inhalations 15 to 20  minutes apart every 4 hours to Rescue Asthma 18 g 3   ipratropium-albuterol (DUONEB) 0.5-2.5 (3) MG/3ML SOLN Take 3 mLs by nebulization every 4 (four) hours as needed. Max:6 doses per day 540 mL 0   omeprazole (PRILOSEC) 40 MG capsule Take 1 capsule (40 mg total) by mouth 2 (two) times daily before a meal. 180 capsule 3   ondansetron (ZOFRAN) 8 MG tablet TAKE 1 TABLET (8 MG TOTAL) BY MOUTH EVERY 8 (EIGHT) HOURS AS NEEDED FOR NAUSEA OR VOMITING. STARTING 3 DAYS AFTER CHEMOTHERAPY 30 tablet 1   AMBULATORY NON FORMULARY MEDICATION Medication Name: Magic mouthwash 1 part viscous lidocaine 2% 1 part Maalox 1 part Benadryl 12.5 mg per 5 mL elixir  Swish and swallow 5 cc every 6 hours as needed (Patient not taking:  Reported on 01/20/2024) 300 mL 1   Ascorbic Acid (VITAMIN C) 1000 MG tablet Take 1,000 mg by mouth daily. Takes 7,000mg  (Patient not taking: Reported on 01/20/2024)     Cholecalciferol (VITAMIN D) 50 MCG (2000 UT) CAPS Take by mouth. Takes 6,000 units (Patient not taking: Reported on 01/20/2024)     ezetimibe (ZETIA) 10 MG tablet Take 1 tablet (10 mg total) by mouth daily. (Patient not taking: Reported on 01/20/2024) 30 tablet 11   FeFum-FePoly-FA-B Cmp-C-Biot (INTEGRA PLUS) CAPS Take 1 capsule by mouth every morning. (Patient not taking: Reported on 01/20/2024) 30 capsule 2   fluticasone-salmeterol (ADVAIR DISKUS) 250-50 MCG/ACT AEPB Inhale 1 puff into the lungs in the morning and at bedtime. (Patient not taking: Reported on 01/20/2024) 60 each 1   magnesium oxide (MAG-OX) 400 (240 Mg) MG tablet Take 1 tablet (400 mg total) by mouth daily. (Patient not taking: Reported on 01/20/2024) 30 tablet 0   prochlorperazine (COMPAZINE) 10 MG tablet Take 1 tablet (10 mg total) by mouth every 6 (six) hours as needed. 30 tablet 2   sucralfate (CARAFATE) 1 g tablet Take 1 tablet (1 g total) by mouth 4 (four) times daily. Dissolve  each tablet in 15 cc water before use. (Patient not taking: Reported on 01/20/2024) 120 tablet 2   Current Facility-Administered Medications  Medication Dose Route Frequency Provider Last Rate Last Admin   0.9 %  sodium chloride infusion  500 mL Intravenous Once Istvan Behar V, DO        Allergies as of 01/20/2024 - Review Complete 01/20/2024  Allergen Reaction Noted   Celebrex [celecoxib] Other (See Comments) 10/15/2013   Decadron [dexamethasone] Other (See Comments) 10/24/2019   Penicillins Other (See Comments) 12/13/2008   Crestor [rosuvastatin] Other (See Comments) 12/21/2012   Doxycycline Other (See Comments) 03/18/2022   Hydrocodone-acetaminophen Hives, Itching, and Rash 03/18/2022   Pravastatin Other (See Comments) 10/15/2013    Family History  Problem Relation Age of Onset   Colon polyps Mother    Inflammatory bowel disease Mother    Hypertension Mother    Stroke Mother    Heart disease Mother    Hyperlipidemia Mother    Varicose Veins Mother    Heart attack Mother    AAA (abdominal aortic aneurysm) Mother    Hypertension Father    Hyperlipidemia Father    Heart disease Father    Heart attack Father    Heart disease Brother    Hyperlipidemia Brother    Hypertension Brother    Heart attack Brother    AAA (abdominal aortic aneurysm) Brother    Heart disease Maternal Grandmother    Diabetes Maternal Grandfather    Heart disease Maternal Grandfather    Varicose Veins Daughter    Colon cancer Other        mat great aunt   Esophageal cancer Neg Hx    Stomach cancer Neg Hx    Rectal cancer Neg Hx     Social History   Socioeconomic History   Marital status: Widowed    Spouse name: Not on file   Number of children: 1   Years of education: Not on file   Highest education level: Not on file  Occupational History   Occupation: disability  Tobacco Use   Smoking status: Former    Current packs/day: 0.00    Average packs/day: 0.3 packs/day for 36.0 years (9.0  ttl pk-yrs)    Types: Cigarettes    Start date: 09/14/1981    Quit date:  09/14/2017    Years since quitting: 6.3   Smokeless tobacco: Never   Tobacco comments:    1-2 sometimes not daily  Vaping Use   Vaping status: Never Used  Substance and Sexual Activity   Alcohol use: Not Currently    Alcohol/week: 0.0 standard drinks of alcohol   Drug use: No   Sexual activity: Not Currently  Other Topics Concern   Not on file  Social History Narrative   Not on file   Social Drivers of Health   Financial Resource Strain: Unknown (09/18/2017)   Overall Financial Resource Strain (CARDIA)    Difficulty of Paying Living Expenses: Patient declined  Food Insecurity: Unknown (09/18/2017)   Hunger Vital Sign    Worried About Running Out of Food in the Last Year: Patient declined    Ran Out of Food in the Last Year: Patient declined  Transportation Needs: Unknown (09/18/2017)   PRAPARE - Administrator, Civil Service (Medical): Patient declined    Lack of Transportation (Non-Medical): Patient declined  Physical Activity: Unknown (09/18/2017)   Exercise Vital Sign    Days of Exercise per Week: Patient declined    Minutes of Exercise per Session: Patient declined  Stress: Stress Concern Present (09/18/2017)   [employer] of Occupational Health - Occupational Stress Questionnaire    Feeling of Stress : To some extent  Social Connections: Unknown (09/18/2017)   Social Connection and Isolation Panel [NHANES]    Frequency of Communication with Friends and Family: Patient declined    Frequency of Social Gatherings with Friends and Family: Patient declined    Attends Religious Services: Patient declined    Database administrator or Organizations: Patient declined    Attends Banker Meetings: Patient declined    Marital Status: Patient declined  Intimate Partner Violence: Unknown (09/18/2017)   Humiliation, Afraid, Rape, and Kick questionnaire    Fear of Current or  Ex-Partner: Patient declined    Emotionally Abused: Patient declined    Physically Abused: Patient declined    Sexually Abused: Patient declined    Physical Exam: Vital signs in last 24 hours: @BP  113/73   Pulse 100   Temp (!) 97.2 F (36.2 C) (Temporal)   Ht 5\' 1"  (1.549 m)   Wt 78 lb (35.4 kg)   SpO2 99%   BMI 14.74 kg/m  GEN: NAD EYE: Sclerae anicteric ENT: MMM CV: Non-tachycardic Pulm: CTA b/l GI: Soft, NT/ND NEURO:  Alert & Oriented x 3   Doristine Locks, DO [facility]   01/20/2024 10:05 AM

## 2024-01-20 NOTE — Progress Notes (Signed)
 GASTROENTEROLOGY PROCEDURE H&P NOTE   Primary Care Physician: [name], MD    Reason for Procedure:  Dysphagia, odynophagia, stricture on barium esophagram  Plan:    EGD  Patient is appropriate for endoscopic procedure(s) in the ambulatory ([facility]) setting.  The nature of the procedure, as well as the risks, benefits, and alternatives were carefully and thoroughly reviewed with the patient. Ample time for discussion and questions allowed. The patient understood, was satisfied, and agreed to proceed.     HPI: [name] is a 68 y.o. female who presents for EGD with dilation and/or biopsy for evaluation of esophageal dysphagia, odynophagia, and recent barium esophagram showing stricture in the proximal esophagus in the setting of recent radiation and chemotherapy for small cell carcinoma of the right lung.  Patient was most recently seen in the Gastroenterology Clinic on 01/04/2024.  No interval change in medical history since that appointment. Please refer to that note for full details regarding GI history and clinical presentation.   Most recent CBC from 3/19 with PLT 110.  Past Medical History:  Diagnosis Date   Allergy    Anemia    Arthritis    Asthma    Blood transfusion without reported diagnosis    Cataract    COPD (chronic obstructive pulmonary disease) (HCC)    Duodenal ulcer    Emphysema of lung (HCC)    Family history of malignant neoplasm of gastrointestinal tract    GERD (gastroesophageal reflux disease)    Hepatitis    had in 6th grade pt unsure which kind   Hiatal hernia    HTN (hypertension) 10/03/2014   Hx of colonic polyps    Hyperlipemia    Internal hemorrhoids    Macular degeneration    Osteoporosis    Peripheral vascular disease (HCC)    Pneumonia    hx of   Shortness of breath dyspnea    with ambulation   Small cell carcinoma metastatic to right lung (HCC)    Stroke (HCC)    Trigeminal neuralgia    Vitamin D deficiency      Past Surgical History:  Procedure Laterality Date   ABDOMINAL HYSTERECTOMY     APPENDECTOMY     CATARACT EXTRACTION, BILATERAL     COLONOSCOPY     CRANIOTOMY     for trigeminal neuralgia   ELBOW SURGERY Right    ENDARTERECTOMY Right 12/16/2014   Procedure: ENDARTERECTOMY CAROTID with Dacron patch;  Surgeon: Larina Earthly, MD;  Location: [facility] OR;  Service: Vascular;  Laterality: Right;   ESOPHAGOGASTRODUODENOSCOPY  10/2008   FINE NEEDLE ASPIRATION BIOPSY  07/25/2023   Procedure: FINE NEEDLE ASPIRATION BIOPSY;  Surgeon: Leslye Peer, MD;  Location: [facility] ENDOSCOPY;  Service: Pulmonary;;   HAND SURGERY Left    LUMBAR DISC SURGERY     TOTAL HIP ARTHROPLASTY Right    trigeminal neuralgia surgery     UPPER GASTROINTESTINAL ENDOSCOPY     VIDEO BRONCHOSCOPY WITH ENDOBRONCHIAL ULTRASOUND N/A 07/25/2023   Procedure: VIDEO BRONCHOSCOPY WITH ENDOBRONCHIAL ULTRASOUND;  Surgeon: Leslye Peer, MD;  Location: [facility] ENDOSCOPY;  Service: Pulmonary;  Laterality: N/A;   WRIST SURGERY Left     Prior to Admission medications   Medication Sig Start Date End Date Taking? Authorizing Provider  albuterol (PROVENTIL) (2.5 MG/3ML) 0.083% nebulizer solution Take 3 mLs (2.5 mg total) by nebulization every 6 (six) hours as needed for wheezing or shortness of breath. 06/13/23  Yes Cranford, Tonya, NP  albuterol (VENTOLIN HFA) 108 (90  Base) MCG/ACT inhaler 2 Inhalations 15 to 20  minutes apart every 4 hours to Rescue Asthma 05/11/23  Yes Cranford, Tonya, NP  ipratropium-albuterol (DUONEB) 0.5-2.5 (3) MG/3ML SOLN Take 3 mLs by nebulization every 4 (four) hours as needed. Max:6 doses per day 05/11/23  Yes Cranford, Archie Patten, NP  omeprazole (PRILOSEC) 40 MG capsule Take 1 capsule (40 mg total) by mouth 2 (two) times daily before a meal. 01/04/24  Yes May, Deanna J, NP  ondansetron (ZOFRAN) 8 MG tablet TAKE 1 TABLET (8 MG TOTAL) BY MOUTH EVERY 8 (EIGHT) HOURS AS NEEDED FOR NAUSEA OR VOMITING. STARTING 3 DAYS AFTER CHEMOTHERAPY  10/18/23  Yes Heilingoetter, Cassandra L, PA-C  AMBULATORY NON FORMULARY MEDICATION Medication Name: Magic mouthwash 1 part viscous lidocaine 2% 1 part Maalox 1 part Benadryl 12.5 mg per 5 mL elixir  Swish and swallow 5 cc every 6 hours as needed Patient not taking: Reported on 01/20/2024 11/07/23   May, Deanna J, NP  Ascorbic Acid (VITAMIN C) 1000 MG tablet Take 1,000 mg by mouth daily. Takes 7,000mg  Patient not taking: Reported on 01/20/2024    [provider]  Cholecalciferol (VITAMIN D) 50 MCG (2000 UT) CAPS Take by mouth. Takes 6,000 units Patient not taking: Reported on 01/20/2024    [provider]  ezetimibe (ZETIA) 10 MG tablet Take 1 tablet (10 mg total) by mouth daily. Patient not taking: Reported on 01/20/2024 03/18/22 03/18/23  [name], NP  FeFum-FePoly-FA-B Cmp-C-Biot (INTEGRA PLUS) CAPS Take 1 capsule by mouth every morning. Patient not taking: Reported on 01/20/2024 08/02/23   Heilingoetter, Cassandra L, PA-C  fluticasone-salmeterol (ADVAIR DISKUS) 250-50 MCG/ACT AEPB Inhale 1 puff into the lungs in the morning and at bedtime. Patient not taking: Reported on 01/20/2024 03/18/22   [name], NP  magnesium oxide (MAG-OX) 400 (240 Mg) MG tablet Take 1 tablet (400 mg total) by mouth daily. Patient not taking: Reported on 01/20/2024 10/10/23   Heilingoetter, Cassandra L, PA-C  prochlorperazine (COMPAZINE) 10 MG tablet Take 1 tablet (10 mg total) by mouth every 6 (six) hours as needed. 08/01/23   Heilingoetter, Cassandra L, PA-C  sucralfate (CARAFATE) 1 g tablet Take 1 tablet (1 g total) by mouth 4 (four) times daily. Dissolve each tablet in 15 cc water before use. Patient not taking: Reported on 01/20/2024 08/19/23   Dorothy Puffer, MD    Current Outpatient Medications  Medication Sig Dispense Refill   albuterol (PROVENTIL) (2.5 MG/3ML) 0.083% nebulizer solution Take 3 mLs (2.5 mg total) by nebulization every 6 (six) hours as needed for wheezing or shortness of breath.  75 mL 12   albuterol (VENTOLIN HFA) 108 (90 Base) MCG/ACT inhaler 2 Inhalations 15 to 20  minutes apart every 4 hours to Rescue Asthma 18 g 3   ipratropium-albuterol (DUONEB) 0.5-2.5 (3) MG/3ML SOLN Take 3 mLs by nebulization every 4 (four) hours as needed. Max:6 doses per day 540 mL 0   omeprazole (PRILOSEC) 40 MG capsule Take 1 capsule (40 mg total) by mouth 2 (two) times daily before a meal. 180 capsule 3   ondansetron (ZOFRAN) 8 MG tablet TAKE 1 TABLET (8 MG TOTAL) BY MOUTH EVERY 8 (EIGHT) HOURS AS NEEDED FOR NAUSEA OR VOMITING. STARTING 3 DAYS AFTER CHEMOTHERAPY 30 tablet 1   AMBULATORY NON FORMULARY MEDICATION Medication Name: Magic mouthwash 1 part viscous lidocaine 2% 1 part Maalox 1 part Benadryl 12.5 mg per 5 mL elixir  Swish and swallow 5 cc every 6 hours as needed (Patient not taking:  Reported on 01/20/2024) 300 mL 1   Ascorbic Acid (VITAMIN C) 1000 MG tablet Take 1,000 mg by mouth daily. Takes 7,000mg  (Patient not taking: Reported on 01/20/2024)     Cholecalciferol (VITAMIN D) 50 MCG (2000 UT) CAPS Take by mouth. Takes 6,000 units (Patient not taking: Reported on 01/20/2024)     ezetimibe (ZETIA) 10 MG tablet Take 1 tablet (10 mg total) by mouth daily. (Patient not taking: Reported on 01/20/2024) 30 tablet 11   FeFum-FePoly-FA-B Cmp-C-Biot (INTEGRA PLUS) CAPS Take 1 capsule by mouth every morning. (Patient not taking: Reported on 01/20/2024) 30 capsule 2   fluticasone-salmeterol (ADVAIR DISKUS) 250-50 MCG/ACT AEPB Inhale 1 puff into the lungs in the morning and at bedtime. (Patient not taking: Reported on 01/20/2024) 60 each 1   magnesium oxide (MAG-OX) 400 (240 Mg) MG tablet Take 1 tablet (400 mg total) by mouth daily. (Patient not taking: Reported on 01/20/2024) 30 tablet 0   prochlorperazine (COMPAZINE) 10 MG tablet Take 1 tablet (10 mg total) by mouth every 6 (six) hours as needed. 30 tablet 2   sucralfate (CARAFATE) 1 g tablet Take 1 tablet (1 g total) by mouth 4 (four) times daily. Dissolve  each tablet in 15 cc water before use. (Patient not taking: Reported on 01/20/2024) 120 tablet 2   Current Facility-Administered Medications  Medication Dose Route Frequency Provider Last Rate Last Admin   0.9 %  sodium chloride infusion  500 mL Intravenous Once Istvan Behar V, DO        Allergies as of 01/20/2024 - Review Complete 01/20/2024  Allergen Reaction Noted   Celebrex [celecoxib] Other (See Comments) 10/15/2013   Decadron [dexamethasone] Other (See Comments) 10/24/2019   Penicillins Other (See Comments) 12/13/2008   Crestor [rosuvastatin] Other (See Comments) 12/21/2012   Doxycycline Other (See Comments) 03/18/2022   Hydrocodone-acetaminophen Hives, Itching, and Rash 03/18/2022   Pravastatin Other (See Comments) 10/15/2013    Family History  Problem Relation Age of Onset   Colon polyps Mother    Inflammatory bowel disease Mother    Hypertension Mother    Stroke Mother    Heart disease Mother    Hyperlipidemia Mother    Varicose Veins Mother    Heart attack Mother    AAA (abdominal aortic aneurysm) Mother    Hypertension Father    Hyperlipidemia Father    Heart disease Father    Heart attack Father    Heart disease Brother    Hyperlipidemia Brother    Hypertension Brother    Heart attack Brother    AAA (abdominal aortic aneurysm) Brother    Heart disease Maternal Grandmother    Diabetes Maternal Grandfather    Heart disease Maternal Grandfather    Varicose Veins Daughter    Colon cancer Other        mat great aunt   Esophageal cancer Neg Hx    Stomach cancer Neg Hx    Rectal cancer Neg Hx     Social History   Socioeconomic History   Marital status: Widowed    Spouse name: Not on file   Number of children: 1   Years of education: Not on file   Highest education level: Not on file  Occupational History   Occupation: disability  Tobacco Use   Smoking status: Former    Current packs/day: 0.00    Average packs/day: 0.3 packs/day for 36.0 years (9.0  ttl pk-yrs)    Types: Cigarettes    Start date: 09/14/1981    Quit date:  09/14/2017    Years since quitting: 6.3   Smokeless tobacco: Never   Tobacco comments:    1-2 sometimes not daily  Vaping Use   Vaping status: Never Used  Substance and Sexual Activity   Alcohol use: Not Currently    Alcohol/week: 0.0 standard drinks of alcohol   Drug use: No   Sexual activity: Not Currently  Other Topics Concern   Not on file  Social History Narrative   Not on file   Social Drivers of Health   Financial Resource Strain: Unknown (09/18/2017)   Overall Financial Resource Strain (CARDIA)    Difficulty of Paying Living Expenses: Patient declined  Food Insecurity: Unknown (09/18/2017)   Hunger Vital Sign    Worried About Running Out of Food in the Last Year: Patient declined    Ran Out of Food in the Last Year: Patient declined  Transportation Needs: Unknown (09/18/2017)   PRAPARE - Administrator, Civil Service (Medical): Patient declined    Lack of Transportation (Non-Medical): Patient declined  Physical Activity: Unknown (09/18/2017)   Exercise Vital Sign    Days of Exercise per Week: Patient declined    Minutes of Exercise per Session: Patient declined  Stress: Stress Concern Present (09/18/2017)   [employer] of Occupational Health - Occupational Stress Questionnaire    Feeling of Stress : To some extent  Social Connections: Unknown (09/18/2017)   Social Connection and Isolation Panel [NHANES]    Frequency of Communication with Friends and Family: Patient declined    Frequency of Social Gatherings with Friends and Family: Patient declined    Attends Religious Services: Patient declined    Database administrator or Organizations: Patient declined    Attends Banker Meetings: Patient declined    Marital Status: Patient declined  Intimate Partner Violence: Unknown (09/18/2017)   Humiliation, Afraid, Rape, and Kick questionnaire    Fear of Current or  Ex-Partner: Patient declined    Emotionally Abused: Patient declined    Physically Abused: Patient declined    Sexually Abused: Patient declined    Physical Exam: Vital signs in last 24 hours: @BP  113/73   Pulse 100   Temp (!) 97.2 F (36.2 C) (Temporal)   Ht 5\' 1"  (1.549 m)   Wt 78 lb (35.4 kg)   SpO2 99%   BMI 14.74 kg/m  GEN: NAD EYE: Sclerae anicteric ENT: MMM CV: Non-tachycardic Pulm: CTA b/l GI: Soft, NT/ND NEURO:  Alert & Oriented x 3   Doristine Locks, DO [facility]   01/20/2024 10:05 AM

## 2024-01-20 NOTE — Patient Instructions (Addendum)
 YOU HAD AN ENDOSCOPIC PROCEDURE TODAY AT THE [facility]:   Refer to the procedure report that was given to you for any specific questions about what was found during the examination.  If the procedure report does not answer your questions, please call your gastroenterologist to clarify.  If you requested that your care partner not be given the details of your procedure findings, then the procedure report has been included in a sealed envelope for you to review at your convenience later.  YOU SHOULD EXPECT: Some feelings of bloating in the abdomen. Passage of more gas than usual.  Walking can help get rid of the air that was put into your GI tract during the procedure and reduce the bloating. If you had a lower endoscopy (such as a colonoscopy or flexible sigmoidoscopy) you may notice spotting of blood in your stool or on the toilet paper. If you underwent a bowel prep for your procedure, you may not have a normal bowel movement for a few days.  Please Note:  You might notice some irritation and congestion in your nose or some drainage.  This is from the oxygen used during your procedure.  There is no need for concern and it should clear up in a day or so.  SYMPTOMS TO REPORT IMMEDIATELY:   Following upper endoscopy (EGD)  Vomiting of blood or coffee ground material  New chest pain or pain under the shoulder blades  Painful or persistently difficult swallowing  New shortness of breath  Fever of 100F or higher  Black, tarry-looking stools  For urgent or emergent issues, a gastroenterologist can be reached at any hour by calling [phone]. Do not use MyChart messaging for urgent concerns.    DIET:  FULL LIQUID DIET.  Drink plenty of fluids but you should avoid alcoholic beverages for 24 hours.  MEDICATIONS: Continue present medications.  FOLLOW UP: Repeat upper endoscopy at the next available appointment to be scheduled at the hospital endoscopy unit with plan for ultraslim  endoscope and wire guided balloon dilation. Dr. Frankey Shown office nurse will call you to schedule this appointment.  Thank you for allowing [place] to provide for your healthcare needs today.   ACTIVITY:  You should plan to take it easy for the rest of today and you should NOT DRIVE or use heavy machinery until tomorrow (because of the sedation medicines used during the test).    FOLLOW UP: Our staff will call the number listed on your records the next business day following your procedure.  We will call around 7:15- 8:00 am to check on you and address any questions or concerns that you may have regarding the information given to you following your procedure. If we do not reach you, we will leave a message.     If any biopsies were taken you will be contacted by phone or by letter within the next 1-3 weeks.  Please call us at [phone] if you have not heard about the biopsies in 3 weeks.    SIGNATURES/CONFIDENTIALITY: You and/or your care partner have signed paperwork which will be entered into your electronic medical record.  These signatures attest to the fact that that the information above on your After Visit Summary has been reviewed and is understood.  Full responsibility of the confidentiality of this discharge information lies with you and/or your care-partner.

## 2024-01-23 ENCOUNTER — Telehealth: Payer: Self-pay

## 2024-01-23 NOTE — Telephone Encounter (Signed)
  Follow up Call-     01/20/2024    9:40 AM  Call back number  Post procedure Call Back phone  # [phone]  Permission to leave phone message Yes     Patient questions:  Do you have a fever, pain , or abdominal swelling? No. Pain Score  0 *  Have you tolerated food without any problems? No.  Have you been able to return to your normal activities? Yes.    Do you have any questions about your discharge instructions: Diet   No. Medications  No. Follow up visit  No.  Do you have questions or concerns about your Care? No.  Actions: * If pain score is 4 or above: No action needed, pain <4. Patient is currently waiting to be scheduled for Endoscopy at the hospital due to severe stenosis.

## 2024-01-23 NOTE — Telephone Encounter (Signed)
 Spoke with pt and she is aware of appt and knows to be NPO after midnight.

## 2024-01-24 LAB — SURGICAL PATHOLOGY

## 2024-01-25 ENCOUNTER — Encounter: Payer: Self-pay | Admitting: Gastroenterology

## 2024-01-25 NOTE — Anesthesia Preprocedure Evaluation (Addendum)
 Anesthesia Evaluation  Patient identified by MRN, date of birth, ID band Patient awake    Reviewed: Allergy & Precautions, NPO status , Patient's Chart, lab work & pertinent test results  History of Anesthesia Complications Negative for: history of anesthetic complications  Airway Mallampati: I  TM Distance: >3 FB Neck ROM: Full    Dental  (+) Edentulous Upper, Edentulous Lower, Dental Advisory Given   Pulmonary shortness of breath, asthma , pneumonia, COPD,  COPD inhaler, former smoker    + decreased breath sounds      Cardiovascular hypertension, + Peripheral Vascular Disease   Rhythm:Regular     Neuro/Psych  PSYCHIATRIC DISORDERS  Depression    TIA Neuromuscular disease CVA    GI/Hepatic hiatal hernia, PUD,GERD  Medicated and Controlled,,(+) Hepatitis -  Endo/Other  negative endocrine ROS    Renal/GU      Musculoskeletal  (+) Arthritis ,    Abdominal   Peds  Hematology  (+) Blood dyscrasia, anemia Lab Results      Component                Value               Date                      WBC                      3.6 (L)             07/25/2023                HGB                      12.0                07/25/2023                HCT                      36.5                07/25/2023                MCV                      104.0 (H)           07/25/2023                PLT                      189                 07/25/2023              Anesthesia Other Findings   Reproductive/Obstetrics                             Anesthesia Physical Anesthesia Plan  ASA: 3  Anesthesia Plan: MAC   Post-op Pain Management: Minimal or no pain anticipated   Induction: Intravenous  PONV Risk Score and Plan: 3 and Ondansetron, Dexamethasone, Propofol infusion and TIVA  Airway Management Planned: Natural Airway, Simple Face Mask and Mask  Additional Equipment: None  Intra-op Plan:   Post-operative  Plan: Extubation in OR  Informed Consent: I have reviewed the patients History and  Physical, chart, labs and discussed the procedure including the risks, benefits and alternatives for the proposed anesthesia with the patient or authorized representative who has indicated his/her understanding and acceptance.     Dental advisory given  Plan Discussed with: CRNA and Anesthesiologist  Anesthesia Plan Comments:        Anesthesia Quick Evaluation

## 2024-01-26 ENCOUNTER — Other Ambulatory Visit: Payer: Self-pay

## 2024-01-26 ENCOUNTER — Telehealth: Payer: Self-pay

## 2024-01-26 ENCOUNTER — Ambulatory Visit (HOSPITAL_COMMUNITY)
Admission: RE | Admit: 2024-01-26 | Discharge: 2024-01-26 | Disposition: A | Discharge status: Home / Self Care | Source: Ambulatory Visit | Attending: Gastroenterology | Admitting: Gastroenterology

## 2024-01-26 ENCOUNTER — Ambulatory Visit (HOSPITAL_BASED_OUTPATIENT_CLINIC_OR_DEPARTMENT_OTHER): Admitting: Anesthesiology

## 2024-01-26 ENCOUNTER — Encounter (HOSPITAL_COMMUNITY)
Admission: RE | Disposition: A | Discharge status: Home / Self Care | Payer: Self-pay | Source: Ambulatory Visit | Attending: Gastroenterology

## 2024-01-26 ENCOUNTER — Ambulatory Visit (HOSPITAL_COMMUNITY): Admitting: Anesthesiology

## 2024-01-26 DIAGNOSIS — J449 Chronic obstructive pulmonary disease, unspecified: Secondary | ICD-10-CM

## 2024-01-26 DIAGNOSIS — M810 Age-related osteoporosis without current pathological fracture: Secondary | ICD-10-CM | POA: Diagnosis not present

## 2024-01-26 DIAGNOSIS — K3189 Other diseases of stomach and duodenum: Secondary | ICD-10-CM | POA: Diagnosis not present

## 2024-01-26 DIAGNOSIS — J4489 Other specified chronic obstructive pulmonary disease: Secondary | ICD-10-CM | POA: Diagnosis not present

## 2024-01-26 DIAGNOSIS — Z9049 Acquired absence of other specified parts of digestive tract: Secondary | ICD-10-CM | POA: Diagnosis not present

## 2024-01-26 DIAGNOSIS — K222 Esophageal obstruction: Secondary | ICD-10-CM

## 2024-01-26 DIAGNOSIS — K219 Gastro-esophageal reflux disease without esophagitis: Secondary | ICD-10-CM | POA: Insufficient documentation

## 2024-01-26 DIAGNOSIS — Z79899 Other long term (current) drug therapy: Secondary | ICD-10-CM | POA: Insufficient documentation

## 2024-01-26 DIAGNOSIS — I10 Essential (primary) hypertension: Secondary | ICD-10-CM

## 2024-01-26 DIAGNOSIS — Z9221 Personal history of antineoplastic chemotherapy: Secondary | ICD-10-CM | POA: Diagnosis not present

## 2024-01-26 DIAGNOSIS — R1319 Other dysphagia: Secondary | ICD-10-CM | POA: Insufficient documentation

## 2024-01-26 DIAGNOSIS — I2510 Atherosclerotic heart disease of native coronary artery without angina pectoris: Secondary | ICD-10-CM | POA: Insufficient documentation

## 2024-01-26 DIAGNOSIS — Z810 Family history of intellectual disabilities: Secondary | ICD-10-CM | POA: Insufficient documentation

## 2024-01-26 DIAGNOSIS — Z8673 Personal history of transient ischemic attack (TIA), and cerebral infarction without residual deficits: Secondary | ICD-10-CM | POA: Insufficient documentation

## 2024-01-26 DIAGNOSIS — Z923 Personal history of irradiation: Secondary | ICD-10-CM | POA: Diagnosis not present

## 2024-01-26 DIAGNOSIS — D696 Thrombocytopenia, unspecified: Secondary | ICD-10-CM | POA: Diagnosis not present

## 2024-01-26 DIAGNOSIS — C3491 Malignant neoplasm of unspecified part of right bronchus or lung: Secondary | ICD-10-CM | POA: Diagnosis not present

## 2024-01-26 DIAGNOSIS — E785 Hyperlipidemia, unspecified: Secondary | ICD-10-CM | POA: Diagnosis not present

## 2024-01-26 DIAGNOSIS — R1310 Dysphagia, unspecified: Secondary | ICD-10-CM | POA: Diagnosis not present

## 2024-01-26 HISTORY — PX: SAVORY DILATION: SHX5439

## 2024-01-26 SURGERY — EGD, WITH DILATION USING SAVARY-GILLIARD DILATOR OVER GUIDEWIRE
Anesthesia: Monitor Anesthesia Care

## 2024-01-26 MED ORDER — SODIUM CHLORIDE 0.9 % IV SOLN
INTRAVENOUS | Status: AC | PRN
  Administered 2024-01-26: 500 mL via INTRAMUSCULAR

## 2024-01-26 MED ORDER — PROPOFOL 10 MG/ML IV BOLUS
INTRAVENOUS | Status: DC | PRN
  Administered 2024-01-26 (×2): 50 mg via INTRAVENOUS

## 2024-01-26 MED ORDER — PROPOFOL 500 MG/50ML IV EMUL
INTRAVENOUS | Status: DC | PRN
  Administered 2024-01-26: 150 ug/kg/min via INTRAVENOUS

## 2024-01-26 MED ORDER — SODIUM CHLORIDE 0.9 % IV SOLN
INTRAVENOUS | Status: DC
Start: 2024-01-26 — End: 2024-01-26

## 2024-01-26 MED ORDER — PHENYLEPHRINE 80 MCG/ML (10ML) SYRINGE FOR IV PUSH (FOR BLOOD PRESSURE SUPPORT)
PREFILLED_SYRINGE | INTRAVENOUS | Status: DC | PRN
  Administered 2024-01-26 (×4): 80 ug via INTRAVENOUS

## 2024-01-26 MED ORDER — LIDOCAINE 2% (20 MG/ML) 5 ML SYRINGE
INTRAMUSCULAR | Status: DC | PRN
  Administered 2024-01-26: 60 mg via INTRAVENOUS

## 2024-01-26 NOTE — Anesthesia Postprocedure Evaluation (Signed)
 Anesthesia Post Note  Patient: Matisha A Drewes  Procedure(s) Performed: EGD, WITH DILATION USING SAVARY-GILLIARD DILATOR OVER GUIDEWIRE     Patient location during evaluation: PACU Anesthesia Type: MAC Level of consciousness: awake and alert Pain management: pain level controlled Vital Signs Assessment: post-procedure vital signs reviewed and stable Respiratory status: spontaneous breathing, nonlabored ventilation, respiratory function stable and patient connected to nasal cannula oxygen Cardiovascular status: stable and blood pressure returned to baseline Postop Assessment: no apparent nausea or vomiting Anesthetic complications: no   No notable events documented.  Last Vitals:  Vitals:   01/26/24 1200 01/26/24 1215  BP: 111/64 100/63  Pulse: 96 87  Resp: 17 18  Temp:  37 C  SpO2: 98% 97%    Last Pain:  Vitals:   01/26/24 1215  TempSrc:   PainSc: 0-No pain                 Rosely Fernandez

## 2024-01-26 NOTE — Telephone Encounter (Signed)
 Pt scheduled for egd with savary/fluro at [facility] with Dr Chales Abrahams on 02/14/24 at 9am. Case ZO#[id number].

## 2024-01-26 NOTE — Op Note (Signed)
 [facility] Patient Name: [name] Procedure Date : 01/26/2024 MRN: [id number] Attending MD: Dub Amis. Tomasa Dougherty , MD, [id number] Date of Birth: 01-Feb-1956 CSN: [id number] Age: 68 Admit Type: Outpatient Procedure:                Upper GI endoscopy Indications:              For therapy of esophageal stricture; Patient                            recently completed radiation treatment for lung                            cancer with development of severe dysphagia. EGD in                            [facility] last week showed severe proximal stricture not                            traversable with standard gastroscope. Biopsies                            with benign mucosa, consistent with                            radiation-induced stricture. Patient unable to                            tolerate solid food currently. Providers:                Dub Amis. Tomasa Rand, MD, Suzy Bouchard, RN, Harrington Challenger, Technician Referring MD:              Medicines:                Monitored Anesthesia Care Complications:            No immediate complications. Estimated Blood Loss:     Estimated blood loss was minimal. Procedure:                Pre-Anesthesia Assessment:                           - Prior to the procedure, a History and Physical                            was performed, and patient medications and                            allergies were reviewed. The patient's tolerance of                            previous anesthesia was also reviewed. The risks                            and benefits of  the procedure and the sedation                            options and risks were discussed with the patient.                            All questions were answered, and informed consent                            was obtained. Prior Anticoagulants: The patient has                            taken no anticoagulant or antiplatelet agents. ASA                             Grade Assessment: III - A patient with severe                            systemic disease. After reviewing the risks and                            benefits, the patient was deemed in satisfactory                            condition to undergo the procedure.                           After obtaining informed consent, the endoscope was                            passed under direct vision. Throughout the                            procedure, the patient's blood pressure, pulse, and                            oxygen saturations were monitored continuously. The                            [id number]-[id number] ([id number]) Olympus slim endoscope was                            introduced through the mouth, and advanced to the                            second part of duodenum. The upper GI endoscopy was                            accomplished without difficulty. The patient                            tolerated the procedure well. Scope In: Scope Out: Findings:      The examined portions of the nasopharynx, oropharynx and larynx were  normal.      One benign-appearing, intrinsic severe stenosis with ulceration was       found 24 to 26 cm from the incisors. This stenosis measured       approximately 4 mm (inner diameter) x 2 cm (in length). The stenosis was       traversed with some resistance with the ultraslim upper endoscope. A       guidewire was placed and the scope was withdrawn. Dilation was performed       with a Savary dilator with no resistance at 5 mm and 6 mm. The scope was       then reinserted and did not show any mucosal disruption. A guidewire was       placed again and the scope was withdrawn. Dilation was performed with a       Savary dilator with mild resistance at 8 mm. The dilation site was       examined following endoscope reinsertion and showed moderate mucosal       disruption and significant improvement in luminal diameter. Estimated       blood loss was minimal.      A  deformity was found in the prepyloric region of the stomach.      A 5 mm scar was found in the gastric antrum.      Diffuse mild mucosal changes characterized by erythema were found in the       gastric body and in the gastric antrum.      The examined duodenum was normal. Impression:               - The examined portions of the nasopharynx,                            oropharynx and larynx were normal.                           - Benign-appearing severe esophageal stenosis,                            radiation-induced. Dilated to 8 mm.                           - Acquired deformity in the prepyloric region of                            the stomach, consistent with history of peptic                            ulcer disease.                           - Scar in the gastric antrum.                           - Erythematous mucosa in the gastric body and                            antrum.                           -  Normal examined duodenum.                           - No specimens collected.                           - No gastric biopsies taken due to limited utility                            of forceps biopsies with ultraslim endoscope. Moderate Sedation:      N/A Recommendation:           - Patient has a contact number available for                            emergencies. The signs and symptoms of potential                            delayed complications were discussed with the                            patient. Return to normal activities tomorrow.                            Written discharge instructions were provided to the                            patient.                           - NPO for 2 hours, then start clear liquids. Stay                            on clear liquids until tomorrow. Ok to advance to                            full liquids tomorrow if no significant pain with                            swallowing with clear liquids.                           - Continue  present medications.                           - Repeat upper endoscopy in hospital in 2-4 weeks                            for repeat dilation.                           - Continue twice daily omeprazole and carafate QID. Procedure Code(s):        --- Professional ---                           [phone], Esophagogastroduodenoscopy, flexible,  transoral; with insertion of guide wire followed by                            passage of dilator(s) through esophagus over guide                            wire Diagnosis Code(s):        --- Professional ---                           K22.2, Esophageal obstruction                           K31.89, Other diseases of stomach and duodenum CPT copyright 2022 [employer]. All rights reserved. The codes documented in this report are preliminary and upon coder review may  be revised to meet current compliance requirements. Brenda Tukes E. Tomasa Rand, MD 01/26/2024 11:51:09 AM This report has been signed electronically. Number of Addenda: 0

## 2024-01-26 NOTE — Interval H&P Note (Signed)
 History and Physical Interval Note:  01/26/2024 11:08 AM  [name]  has presented today for surgery, with the diagnosis of Severe Stricture.  The various methods of treatment have been discussed with the patient and family. After consideration of risks, benefits and other options for treatment, the patient has consented to  Procedure(s) with comments: EGD, WITH DILATION USING SAVARY-GILLIARD DILATOR OVER GUIDEWIRE (N/A) - Needs fluro as a surgical intervention.  The patient's history has been reviewed, patient examined, no change in status, stable for surgery.  I have reviewed the patient's chart and labs.  Questions were answered to the patient's satisfaction.    Patient underwent attempted EGD in [facility] on April 4th, but the stricture was unable to be traversed with the standard gastroscope.  Repeating EGD today in the hospital with use of ultraslim upper endoscope.  Plan to dilate with either balloon vs. Savary dilator.  Patient currently tolerating liquid diet only.   Jenel Lucks

## 2024-01-26 NOTE — Transfer of Care (Signed)
 Immediate Anesthesia Transfer of Care Note  Patient: [name]  Procedure(s) Performed: EGD, WITH DILATION USING SAVARY-GILLIARD DILATOR OVER GUIDEWIRE  Patient Location: PACU  Anesthesia Type:MAC  Level of Consciousness: awake, alert , and oriented  Airway & Oxygen Therapy: Patient Spontanous Breathing and Patient connected to nasal cannula oxygen  Post-op Assessment: Report given to RN and Post -op Vital signs reviewed and stable  Post vital signs: Reviewed and stable  Last Vitals:  Vitals Value Taken Time  BP 104/74 01/26/24 1149  Temp 37.1 C 01/26/24 1149  Pulse 100 01/26/24 1153  Resp 19 01/26/24 1153  SpO2 97 % 01/26/24 1153  Vitals shown include unfiled device data.  Last Pain:  Vitals:   01/26/24 0923  TempSrc: Temporal  PainSc: 0-No pain         Complications: No notable events documented.

## 2024-01-26 NOTE — Telephone Encounter (Signed)
 ----- Message from Lynann Bologna sent at 01/26/2024 12:17 PM EDT ----- Regarding: RE: Sure Let's have Floro available at time of EGD. RG ----- Message ----- From: Chrystie Nose, RN Sent: 01/26/2024  12:14 PM EDT To: Hilarie Fredrickson, MD; Lynann Bologna, MD  Dr. Chales Abrahams would you be willing to do EGD With dilation at [facility] on 02/14/24 on this perry pt? ----- Message ----- From: Hilarie Fredrickson, MD Sent: 01/26/2024  12:09 PM EDT To: Chrystie Nose, RN; Shellia Cleverly, DO; #  Thanks Lorin Picket. Yes Bonita Quin, please arrange another hospital in this slot for dilation in 2 to 4 weeks with an available provider who is willing to perform her procedure. Thanks, JP ----- Message ----- From: Jenel Lucks, MD Sent: 01/26/2024  12:02 PM EDT To: Hilarie Fredrickson, MD; Chrystie Nose, RN; #  Procedure went well today.  Was able traverse stricture with ultraslim with some resistance.  Dilated with Savary without fluoro to 5mm and then 6mm without significant effect.  Dilated to 8mm with a good post dilation effect.  Will need serial dilations, likely in the hospital for the next one for sure.  Bonita Quin,  Can you assist with getting her another hospital endo slot in 2-4 weeks? ----- Message ----- From: Hilarie Fredrickson, MD Sent: 01/20/2024   4:38 PM EDT To: Chrystie Nose, RN; Shellia Cleverly, DO; #  Theodoro Kalata to schedule with Dr. Tomasa Rand if he agrees.  Lorin Picket, This is a patient of mine with symptomatic high-grade radiation-induced esophageal stricture. Vito saw her in the [facility] today (based on my lack of availability in the [facility]) and could not pass the scope beyond the strictured region.  See his report. Obviously, she needs guidewire assisted dilation (Savary) with fluoroscopy.  As her dysphagia is severe, it would be helpful for her to be dilated sooner rather than later, but needs to be done at the hospital.. If you are comfortable with this, it appears that you have an opening next week. I will have Bonita Quin put the  patient in your outpatient slot for now.  Let us know if you agree. Thanks, John ----- Message ----- From: Chrystie Nose, RN Sent: 01/20/2024   4:25 PM EDT To: Hilarie Fredrickson, MD; Shellia Cleverly, DO; #  Dr. Marina Goodell your next spot is 03/28/24.  Dr. Tomasa Rand has a slot at [facility] on 01/27/24.   All Please advise if ok to schedule. ----- Message ----- From: Hilarie Fredrickson, MD Sent: 01/20/2024  12:36 PM EDT To: Hilarie Fredrickson, MD; Chrystie Nose, RN; #  Thanks Gae Bon.  Taite Schoeppner, Set up at hospital for [facility] dilation with fluoroscopy. Her stricture is severe, so if I am out a while for spots, lets look for others who have sooner availability, to help her. Thanks, JP ----- Message ----- From: Shellia Cleverly, DO Sent: 01/20/2024  10:30 AM EDT To: Hilarie Fredrickson, MD; Margarite Gouge May, NP  Attempted EGD on this patient today.  Unfortunately she has a pretty severe stricture (suspect radiation-induced stricture) starting at 22 cm from the incisors, measuring about 3-4 mm in diameter.  Unable to traverse.  Was not sure how long the stricture is, but could not see the other side clearly, so did not proceed with blind balloon dilatation.  Will need to reschedule at the hospital Endo unit with baby scope.  I took some biopsies, but could not debride enough of the tissue away to allow endoscope passage.  Sorry, wish I could have gotten through to start her serial dilations.   VC

## 2024-01-27 NOTE — Telephone Encounter (Signed)
 Spoke with pt and she is aware of appt and knows to arrive there @7 :30am and to be NPO after midnight.

## 2024-01-30 ENCOUNTER — Encounter (HOSPITAL_COMMUNITY): Payer: Self-pay | Admitting: Gastroenterology

## 2024-02-06 ENCOUNTER — Inpatient Hospital Stay: Attending: Physician Assistant

## 2024-02-06 DIAGNOSIS — C3401 Malignant neoplasm of right main bronchus: Secondary | ICD-10-CM | POA: Insufficient documentation

## 2024-02-06 DIAGNOSIS — Z87891 Personal history of nicotine dependence: Secondary | ICD-10-CM | POA: Insufficient documentation

## 2024-02-06 NOTE — Progress Notes (Signed)
 Nutrition Follow-up:  Patient with small cell lung cancer.  Patient has completed concurrent chemotherapy and radiation.  Patient was able to have EGD with esophageal dilation on 4/10.  Planning another EGD on 4/28  Spoke with patient via phone.  Reports that she is able to eat a little bit more foods.  Still has to be careful and eating mostly soft, moist, foods.  Drinking ensure plus shakes 3 a day.      Medications: reviewed  Labs: no new  Anthropometrics:   Weight 83 lb per home scale per patient 78 lb on 3/24 79 lb on 2/24 77 lb on 1/30 80 lb 8 oz on 1/13 79 lb 1.6 oz on 1/6   NUTRITION DIAGNOSIS: Inadequate oral intake continues with esophageal narrowing    INTERVENTION:  Continue ensure plus 3-4 times a day Continue high calorie, soft, moist foods    MONITORING, EVALUATION, GOAL: weight trends, intake   NEXT VISIT: phone call Monday, May 19   Jonthan Leite B. Zollie Hipp, CSO, LDN Registered Dietitian [phone]

## 2024-02-08 ENCOUNTER — Encounter (HOSPITAL_COMMUNITY): Payer: Self-pay | Admitting: Gastroenterology

## 2024-02-08 NOTE — Progress Notes (Signed)
Attempted to obtain medical history via telephone, unable to reach at this time. Unable to leave voicemail to return pre surgical testing department's phone call,due to mailbox full.  

## 2024-02-10 ENCOUNTER — Telehealth: Payer: Self-pay

## 2024-02-10 NOTE — Telephone Encounter (Signed)
 Procedure:Endoscopy Procedure date: 02/14/24 Procedure location: [facility] Arrival Time: 7:30 Spoke with the patient Y/N: Y Any prep concerns? N  Has the patient obtained the prep from the pharmacy ? N Do you have a care partner and transportation: Y Any additional concerns? N

## 2024-02-13 ENCOUNTER — Encounter (HOSPITAL_COMMUNITY): Payer: Self-pay

## 2024-02-13 ENCOUNTER — Inpatient Hospital Stay: Payer: Medicare Other

## 2024-02-13 ENCOUNTER — Ambulatory Visit (HOSPITAL_COMMUNITY)
Admission: RE | Admit: 2024-02-13 | Discharge: 2024-02-13 | Disposition: A | Discharge status: Home / Self Care | Source: Ambulatory Visit | Attending: Internal Medicine | Admitting: Internal Medicine

## 2024-02-13 DIAGNOSIS — C3401 Malignant neoplasm of right main bronchus: Secondary | ICD-10-CM

## 2024-02-13 DIAGNOSIS — Z87891 Personal history of nicotine dependence: Secondary | ICD-10-CM | POA: Diagnosis not present

## 2024-02-13 DIAGNOSIS — C3490 Malignant neoplasm of unspecified part of unspecified bronchus or lung: Secondary | ICD-10-CM

## 2024-02-13 LAB — CBC WITH DIFFERENTIAL (CANCER CENTER ONLY)
Abs Immature Granulocytes: 0.01 10*3/uL (ref 0.00–0.07)
Basophils Absolute: 0 10*3/uL (ref 0.0–0.1)
Basophils Relative: 1 %
Eosinophils Absolute: 0.1 10*3/uL (ref 0.0–0.5)
Eosinophils Relative: 2 %
HCT: 31.2 % — ABNORMAL LOW (ref 36.0–46.0)
Hemoglobin: 10.9 g/dL — ABNORMAL LOW (ref 12.0–15.0)
Immature Granulocytes: 0 %
Lymphocytes Relative: 27 %
Lymphs Abs: 0.8 10*3/uL (ref 0.7–4.0)
MCH: 37.8 pg — ABNORMAL HIGH (ref 26.0–34.0)
MCHC: 34.9 g/dL (ref 30.0–36.0)
MCV: 108.3 fL — ABNORMAL HIGH (ref 80.0–100.0)
Monocytes Absolute: 0.3 10*3/uL (ref 0.1–1.0)
Monocytes Relative: 9 %
Neutro Abs: 1.9 10*3/uL (ref 1.7–7.7)
Neutrophils Relative %: 61 %
Platelet Count: 62 10*3/uL — ABNORMAL LOW (ref 150–400)
RBC: 2.88 MIL/uL — ABNORMAL LOW (ref 3.87–5.11)
RDW: 16.1 % — ABNORMAL HIGH (ref 11.5–15.5)
WBC Count: 3.1 10*3/uL — ABNORMAL LOW (ref 4.0–10.5)
nRBC: 0 % (ref 0.0–0.2)

## 2024-02-13 LAB — CMP (CANCER CENTER ONLY)
ALT: 5 U/L (ref 0–44)
AST: 11 U/L — ABNORMAL LOW (ref 15–41)
Albumin: 3.2 g/dL — ABNORMAL LOW (ref 3.5–5.0)
Alkaline Phosphatase: 116 U/L (ref 38–126)
Anion gap: 5 (ref 5–15)
BUN: 10 mg/dL (ref 8–23)
CO2: 24 mmol/L (ref 22–32)
Calcium: 7.9 mg/dL — ABNORMAL LOW (ref 8.9–10.3)
Chloride: 113 mmol/L — ABNORMAL HIGH (ref 98–111)
Creatinine: 0.69 mg/dL (ref 0.44–1.00)
GFR, Estimated: >60 mL/min (ref >60)
Glucose, Bld: 97 mg/dL (ref 70–99)
Potassium: 4.2 mmol/L (ref 3.5–5.1)
Sodium: 142 mmol/L (ref 135–145)
Total Bilirubin: 0.1 mg/dL (ref 0.0–1.2)
Total Protein: 5.6 g/dL — ABNORMAL LOW (ref 6.5–8.1)

## 2024-02-13 LAB — SAMPLE TO BLOOD BANK

## 2024-02-13 MED ORDER — SODIUM CHLORIDE (PF) 0.9 % IJ SOLN
INTRAMUSCULAR | Status: AC
  Filled 2024-02-13: qty 50

## 2024-02-13 MED ORDER — IOHEXOL 300 MG/ML  SOLN
75.0000 mL | Freq: Once | INTRAMUSCULAR | Status: AC | PRN
  Administered 2024-02-13: 75 mL via INTRAVENOUS

## 2024-02-13 NOTE — Anesthesia Preprocedure Evaluation (Signed)
 Anesthesia Evaluation  Patient identified by MRN, date of birth, ID band Patient awake    Reviewed: Allergy & Precautions, Patient's Chart, lab work & pertinent test results  History of Anesthesia Complications Negative for: history of anesthetic complications  Airway Mallampati: I  TM Distance: >3 FB Neck ROM: Full    Dental no notable dental hx. (+) Edentulous Upper, Edentulous Lower, Dental Advisory Given   Pulmonary shortness of breath, asthma , COPD,  COPD inhaler, former smoker   Pulmonary exam normal breath sounds clear to auscultation + decreased breath sounds      Cardiovascular (-) angina + Peripheral Vascular Disease  (-) Past MI Normal cardiovascular exam Rhythm:Regular Rate:Normal     Neuro/Psych  PSYCHIATRIC DISORDERS  Depression    TIA Neuromuscular disease CVA    GI/Hepatic hiatal hernia, PUD,GERD  Medicated and Controlled,,(+) Hepatitis -Esophageal stricture    Endo/Other  negative endocrine ROS    Renal/GU      Musculoskeletal  (+) Arthritis ,    Abdominal   Peds  Hematology  (+) Blood dyscrasia, anemia Lab Results      Component                Value               Date                      WBC                      3.6 (L)             07/25/2023                HGB                      12.0                07/25/2023                HCT                      36.5                07/25/2023                MCV                      104.0 (H)           07/25/2023                PLT                      189                 07/25/2023              Anesthesia Other Findings All: see list  Reproductive/Obstetrics                             Anesthesia Physical Anesthesia Plan  ASA: 3  Anesthesia Plan: MAC   Post-op Pain Management: Minimal or no pain anticipated   Induction:   PONV Risk Score and Plan: 3 and Ondansetron , Propofol  infusion and Treatment may vary due to age  or medical condition  Airway Management Planned: Natural Airway and Nasal Cannula  Additional Equipment: None  Intra-op Plan:   Post-operative Plan:   Informed Consent: I have reviewed the patients History and Physical, chart, labs and discussed the procedure including the risks, benefits and alternatives for the proposed anesthesia with the patient or authorized representative who has indicated his/her understanding and acceptance.     Dental advisory given  Plan Discussed with: CRNA and Surgeon  Anesthesia Plan Comments:        Anesthesia Quick Evaluation

## 2024-02-14 ENCOUNTER — Ambulatory Visit (HOSPITAL_COMMUNITY): Admitting: Anesthesiology

## 2024-02-14 ENCOUNTER — Other Ambulatory Visit: Payer: Self-pay

## 2024-02-14 ENCOUNTER — Encounter (HOSPITAL_COMMUNITY)
Admission: RE | Disposition: A | Discharge status: Home / Self Care | Payer: Self-pay | Source: Home / Self Care | Attending: Gastroenterology

## 2024-02-14 ENCOUNTER — Encounter (HOSPITAL_COMMUNITY): Payer: Self-pay | Admitting: Gastroenterology

## 2024-02-14 ENCOUNTER — Ambulatory Visit (HOSPITAL_COMMUNITY)

## 2024-02-14 ENCOUNTER — Ambulatory Visit (HOSPITAL_COMMUNITY)
Admission: RE | Admit: 2024-02-14 | Discharge: 2024-02-14 | Disposition: A | Discharge status: Home / Self Care | Attending: Gastroenterology | Admitting: Gastroenterology

## 2024-02-14 DIAGNOSIS — Z8673 Personal history of transient ischemic attack (TIA), and cerebral infarction without residual deficits: Secondary | ICD-10-CM | POA: Diagnosis not present

## 2024-02-14 DIAGNOSIS — M1990 Unspecified osteoarthritis, unspecified site: Secondary | ICD-10-CM | POA: Diagnosis not present

## 2024-02-14 DIAGNOSIS — K229 Disease of esophagus, unspecified: Secondary | ICD-10-CM | POA: Diagnosis not present

## 2024-02-14 DIAGNOSIS — K449 Diaphragmatic hernia without obstruction or gangrene: Secondary | ICD-10-CM | POA: Diagnosis not present

## 2024-02-14 DIAGNOSIS — Z87891 Personal history of nicotine dependence: Secondary | ICD-10-CM | POA: Diagnosis not present

## 2024-02-14 DIAGNOSIS — C3491 Malignant neoplasm of unspecified part of right bronchus or lung: Secondary | ICD-10-CM | POA: Insufficient documentation

## 2024-02-14 DIAGNOSIS — K3189 Other diseases of stomach and duodenum: Secondary | ICD-10-CM | POA: Diagnosis not present

## 2024-02-14 DIAGNOSIS — K222 Esophageal obstruction: Secondary | ICD-10-CM | POA: Diagnosis not present

## 2024-02-14 DIAGNOSIS — G709 Myoneural disorder, unspecified: Secondary | ICD-10-CM | POA: Diagnosis not present

## 2024-02-14 DIAGNOSIS — K219 Gastro-esophageal reflux disease without esophagitis: Secondary | ICD-10-CM | POA: Insufficient documentation

## 2024-02-14 DIAGNOSIS — K259 Gastric ulcer, unspecified as acute or chronic, without hemorrhage or perforation: Secondary | ICD-10-CM

## 2024-02-14 DIAGNOSIS — I739 Peripheral vascular disease, unspecified: Secondary | ICD-10-CM | POA: Diagnosis not present

## 2024-02-14 DIAGNOSIS — Z79899 Other long term (current) drug therapy: Secondary | ICD-10-CM | POA: Diagnosis not present

## 2024-02-14 DIAGNOSIS — R1310 Dysphagia, unspecified: Secondary | ICD-10-CM | POA: Diagnosis present

## 2024-02-14 DIAGNOSIS — Z8711 Personal history of peptic ulcer disease: Secondary | ICD-10-CM | POA: Insufficient documentation

## 2024-02-14 DIAGNOSIS — J4489 Other specified chronic obstructive pulmonary disease: Secondary | ICD-10-CM | POA: Diagnosis not present

## 2024-02-14 HISTORY — PX: ESOPHAGOGASTRODUODENOSCOPY: SHX5428

## 2024-02-14 HISTORY — PX: BONE BIOPSY: SHX375

## 2024-02-14 SURGERY — EGD (ESOPHAGOGASTRODUODENOSCOPY)
Anesthesia: Monitor Anesthesia Care

## 2024-02-14 MED ORDER — PROPOFOL 1000 MG/100ML IV EMUL
INTRAVENOUS | Status: AC
  Filled 2024-02-14: qty 100

## 2024-02-14 MED ORDER — PROPOFOL 500 MG/50ML IV EMUL
INTRAVENOUS | Status: DC | PRN
Start: 2024-02-14 — End: 2024-02-14
  Administered 2024-02-14: 70 ug/kg/min via INTRAVENOUS

## 2024-02-14 MED ORDER — PROPOFOL 10 MG/ML IV BOLUS
INTRAVENOUS | Status: DC | PRN
Start: 2024-02-14 — End: 2024-02-14
  Administered 2024-02-14: 30 mg via INTRAVENOUS
  Administered 2024-02-14 (×2): 40 mg via INTRAVENOUS

## 2024-02-14 MED ORDER — SODIUM CHLORIDE 0.9 % IV SOLN
INTRAVENOUS | Status: DC
Start: 2024-02-14 — End: 2024-02-14

## 2024-02-14 MED ORDER — IOHEXOL 300 MG/ML  SOLN
INTRAMUSCULAR | Status: DC | PRN
  Administered 2024-02-14: 30 mg via ORAL

## 2024-02-14 MED ORDER — SODIUM CHLORIDE 0.9 % IV SOLN
INTRAVENOUS | Status: DC | PRN
Start: 2024-02-14 — End: 2024-02-14

## 2024-02-14 MED ORDER — PHENYLEPHRINE 80 MCG/ML (10ML) SYRINGE FOR IV PUSH (FOR BLOOD PRESSURE SUPPORT)
PREFILLED_SYRINGE | INTRAVENOUS | Status: DC | PRN
  Administered 2024-02-14: 240 ug via INTRAVENOUS

## 2024-02-14 NOTE — Interval H&P Note (Signed)
 History and Physical Interval Note:  02/14/2024 8:46 AM  [name]  has presented today for surgery, with the diagnosis of esophageal stricture.  The various methods of treatment have been discussed with the patient and family. After consideration of risks, benefits and other options for treatment, the patient has consented to  Procedure(s) with comments: EGD, WITH DILATION USING SAVARY-GILLIARD DILATOR OVER GUIDEWIRE (N/A) - WITH FLUORO vs balloon  as a surgical intervention.  The patient's history has been reviewed, patient examined, no change in status, stable for surgery.  I have reviewed the patient's chart and labs.  Questions were answered to the patient's satisfaction.     [name]

## 2024-02-14 NOTE — Transfer of Care (Signed)
 Immediate Anesthesia Transfer of Care Note  Patient: [name]  Procedure(s) Performed: EGD (ESOPHAGOGASTRODUODENOSCOPY) Balloon dilation wire-guided BIOPSY  Patient Location: PACU  Anesthesia Type:MAC  Level of Consciousness: awake  Airway & Oxygen  Therapy: Patient Spontanous Breathing and Patient connected to face mask oxygen   Post-op Assessment: Report given to RN and Post -op Vital signs reviewed and stable  Post vital signs: Reviewed and stable  Last Vitals:  Vitals Value Taken Time  BP 107/46 02/14/24 0942  Temp    Pulse 93 02/14/24 0944  Resp 12 02/14/24 0944  SpO2 99 % 02/14/24 0944  Vitals shown include unfiled device data.  Last Pain:  Vitals:   02/14/24 0734  TempSrc: Temporal  PainSc: 0-No pain         Complications: No notable events documented.

## 2024-02-14 NOTE — Anesthesia Postprocedure Evaluation (Signed)
 Anesthesia Post Note  Patient: [name]  Procedure(s) Performed: EGD (ESOPHAGOGASTRODUODENOSCOPY) Balloon dilation wire-guided BIOPSY     Patient location during evaluation: Endoscopy Anesthesia Type: MAC Level of consciousness: awake and alert Pain management: pain level controlled Vital Signs Assessment: post-procedure vital signs reviewed and stable Respiratory status: spontaneous breathing, nonlabored ventilation, respiratory function stable and patient connected to nasal cannula oxygen  Cardiovascular status: blood pressure returned to baseline and stable Postop Assessment: no apparent nausea or vomiting Anesthetic complications: no  No notable events documented.  Last Vitals:  Vitals:   02/14/24 0955 02/14/24 1000  BP: (!) 93/35 (!) 88/49  Pulse: 87 89  Resp: 17 18  Temp:    SpO2: 92% 97%    Last Pain:  Vitals:   02/14/24 1000  TempSrc:   PainSc: 0-No pain                 Rosalita Combe

## 2024-02-14 NOTE — Op Note (Signed)
 [facility] Patient Name: [name] Procedure Date: 02/14/2024 MRN: [id number] Attending MD: Lajuan Pila , MD, [id number] Date of Birth: July 17, 1956 CSN: [id number] Age: 68 Admit Type: Outpatient Procedure:                Upper GI endoscopy Indications:              Dysphagia in a patient with known radiation-induced                            stricture s/p prev dil 01/26/2024 Providers:                Lajuan Pila, MD, Ambrose Junk, RN, Arlin Benes,                            Technician, Annmarie Basket, CRNA Referring MD:             Dr. Elvin Hammer. Medicines:                Monitored Anesthesia Care Complications:            No immediate complications. Estimated Blood Loss:     Estimated blood loss was minimal. Procedure:                Pre-Anesthesia Assessment:                           - Prior to the procedure, a History and Physical                            was performed, and patient medications and                            allergies were reviewed. The patient's tolerance of                            previous anesthesia was also reviewed. The risks                            and benefits of the procedure and the sedation                            options and risks were discussed with the patient.                            All questions were answered, and informed consent                            was obtained. Prior Anticoagulants: The patient has                            taken no anticoagulant or antiplatelet agents. ASA                            Grade Assessment: III - A patient with severe  systemic disease. After reviewing the risks and                            benefits, the patient was deemed in satisfactory                            condition to undergo the procedure.                           After obtaining informed consent, the endoscope was                            passed under direct vision. Throughout the                             procedure, the patient's blood pressure, pulse, and                            oxygen  saturations were monitored continuously. The                            GIF-H190 ([id number]) Olympus endoscope was introduced                            through the mouth, and advanced to the second part                            of duodenum. The upper GI endoscopy was                            accomplished without difficulty. The patient                            tolerated the procedure well. Scope In: Scope Out: Findings:      One benign-appearing, intrinsic severe (stenosis; an endoscope cannot       pass) stenosis was found 24 to 26 cm from the incisors. This stenosis       measured 4 mm (inner diameter) x 2 cm (in length). A small sinus vs       diverticulum was noted just proximal to the stricture (doubt fistula).       The stenosis was traversed after dilation. A TTS dilator was passed       through the scope. Dilation with an 05-26-09 mm balloon dilator was       performed to 10 mm under fluoroscopic guidance using a wire-guided       balloon. The dilation site was examined and showed moderate mucosal       disruption and moderate improvement in luminal narrowing. Biopsies were       taken with a cold forceps for histology. Estimated blood loss was       minimal. Contrast esophagogram was obtained (into esophageal lumen) to       ensure that there was no perforation.      The Z-line was regular and was found 32 cm from the incisors.      A small hiatal hernia was present.  Two non-bleeding cratered gastric ulcers with no stigmata of bleeding       were found in the prepyloric region of the stomach. The largest lesion       was 10 mm in largest dimension with clean whitish base. Biopsies were       taken with a cold forceps for histology.      A deformity was found in the gastric antrum and in the prepyloric region       of the stomach d/t scarring from previously healed  gastric ulcers.      The duodenum was normal. Impression:               - Benign-appearing severe esophageal stenosis.                            Dilated to 10 mm with wire-guided balloon under                            fluoroscopy. Biopsied followed by limited                            esophagogram.                           - Small hiatal hernia.                           - Non-bleeding gastric ulcers with no stigmata of                            bleeding. Biopsied.                           - Acquired deformity in the gastric antrum and in                            the prepyloric region of the stomach. Moderate Sedation:      Not Applicable - Patient had care per Anesthesia. Recommendation:           - Patient has a contact number available for                            emergencies. The signs and symptoms of potential                            delayed complications were discussed with the                            patient. Return to normal activities tomorrow.                            Written discharge instructions were provided to the                            patient.                           -  Resume previous diet.                           - Continue present medications including omeprazole                             and Carafate . Please open omeprazole  capsule and                            take it with applesauce twice a day. Do not chew.                           - Await pathology results.                           - Avoid nonsteroidals.                           - Repeat upper endoscopy in 6-8 weeks for                            retreatment and to ensure healing of ulcers. I                            would recommend a barium esophagogram prior to next                            EGD. Will get in touch with Dr. Elvin Hammer                           - Please chew foods especially meats and breads                            well and eat slowly.                           -  The findings and recommendations were discussed                            with the patient's family. Procedure Code(s):        --- Professional ---                           [phone], Esophagogastroduodenoscopy, flexible,                            transoral; with transendoscopic balloon dilation of                            esophagus (less than 30 mm diameter)                           43239, 59, Esophagogastroduodenoscopy, flexible,                            transoral; with  biopsy, single or multiple                           74360, Intraluminal dilation of strictures and/or                            obstructions (eg, esophagus), radiological                            supervision and interpretation Diagnosis Code(s):        --- Professional ---                           K22.2, Esophageal obstruction                           K22.9, Disease of esophagus, unspecified                           K44.9, Diaphragmatic hernia without obstruction or                            gangrene                           K25.9, Gastric ulcer, unspecified as acute or                            chronic, without hemorrhage or perforation                           K31.89, Other diseases of stomach and duodenum                           R13.10, Dysphagia, unspecified CPT copyright 2022 [employer]. All rights reserved. The codes documented in this report are preliminary and upon coder review may  be revised to meet current compliance requirements. Lajuan Pila, MD 02/14/2024 10:02:13 AM This report has been signed electronically. Number of Addenda: 0

## 2024-02-14 NOTE — Discharge Instructions (Signed)

## 2024-02-15 ENCOUNTER — Encounter (HOSPITAL_COMMUNITY): Payer: Self-pay | Admitting: Gastroenterology

## 2024-02-16 ENCOUNTER — Telehealth: Payer: Self-pay | Admitting: Gastroenterology

## 2024-02-16 LAB — SURGICAL PATHOLOGY

## 2024-02-16 NOTE — Telephone Encounter (Signed)
 Patient daughter called and stated that she is going to need to schedule her mother for another EGD at the hospital scheduled for 2-3 weeks. Patient daughter is requesting a call back. Please advise.

## 2024-02-16 NOTE — Telephone Encounter (Signed)
 Thanks [name]! We're making progress.  [name], Find a willing colleague to perform repeat EGD with dilation at the hospital in 2-3 weeks. Thanks  JP

## 2024-02-16 NOTE — Telephone Encounter (Signed)
 Rpt EGD with further dil of XRT stricture in appox 6 weeks at [facility] with Dr Elvin Hammer or any available MD I discussed with daughter after EGD-she was not keen to get barium swallow performed [name], please make arrangements for OV with Dr. Elvin Hammer or APP  RG  Will send cc to Dr Elvin Hammer

## 2024-02-19 ENCOUNTER — Encounter: Payer: Self-pay | Admitting: Gastroenterology

## 2024-02-20 ENCOUNTER — Inpatient Hospital Stay: Payer: Medicare Other | Attending: Physician Assistant | Admitting: Internal Medicine

## 2024-02-20 VITALS — BP 142/65 | HR 93 | Temp 98.1°F | Resp 16 | Ht 61.0 in | Wt 87.0 lb

## 2024-02-20 DIAGNOSIS — C3401 Malignant neoplasm of right main bronchus: Secondary | ICD-10-CM | POA: Insufficient documentation

## 2024-02-20 DIAGNOSIS — I10 Essential (primary) hypertension: Secondary | ICD-10-CM | POA: Insufficient documentation

## 2024-02-20 DIAGNOSIS — Z87891 Personal history of nicotine dependence: Secondary | ICD-10-CM | POA: Insufficient documentation

## 2024-02-20 DIAGNOSIS — C3490 Malignant neoplasm of unspecified part of unspecified bronchus or lung: Secondary | ICD-10-CM

## 2024-02-20 DIAGNOSIS — R1310 Dysphagia, unspecified: Secondary | ICD-10-CM | POA: Diagnosis not present

## 2024-02-20 DIAGNOSIS — J90 Pleural effusion, not elsewhere classified: Secondary | ICD-10-CM | POA: Insufficient documentation

## 2024-02-20 DIAGNOSIS — Z800 Family history of malignant neoplasm of digestive organs: Secondary | ICD-10-CM | POA: Diagnosis not present

## 2024-02-20 DIAGNOSIS — Z79899 Other long term (current) drug therapy: Secondary | ICD-10-CM | POA: Insufficient documentation

## 2024-02-20 MED ORDER — ONDANSETRON HCL 8 MG PO TABS: 8.0000 mg | ORAL_TABLET | Freq: Three times a day (TID) | ORAL | 1 refills | Status: DC | PRN

## 2024-02-20 NOTE — Telephone Encounter (Signed)
 Dr Bridgett Camps you had a cancellation due to covid on 02/27/24. Could you do an EGD wil dil on this [name] pt in that slot? Please advise.

## 2024-02-20 NOTE — Progress Notes (Signed)
 [facility] [phone]   [phone]  OFFICE PROGRESS NOTE  Patient, No Pcp Per No address on file  DIAGNOSIS: Limited stage small cell lung cancer (T1c, N2, M0) small cell lung cancer. The patient presented with a hilar mass and right paratracheal lymphadenopathy. She was diagnosed in October 2024.    PRIOR THERAPY: Systemic chemotherapy with cisplatin  on day 1 and etoposide  100 mg/m on days 1, 2, and 3 IV every 3 weeks.  This is concurrent radiation.  First dose on 08/08/2023. Status post 4 cycles.    CURRENT THERAPY:    INTERVAL HISTORY: [name] 68 y.o. female returns to the clinic today for follow-up visit accompanied by her daughter. Discussed the use of AI scribe software for clinical note transcription with the patient, who gave verbal consent to proceed.  History of Present Illness   [name] "[name]" is a 68 year old female with limited stage small cell lung cancer who presents for follow-up after chemotherapy and radiation treatment. She is accompanied by her daughter.  She was diagnosed with limited stage small cell lung cancer in October 2024 and completed a course of systemic chemotherapy with cisplatin  and etoposide  concurrent with radiation for four cycles. She is currently under observation with repeat CT scans for restaging of her disease.  She has experienced improvement in her ability to eat following esophageal stretching procedures performed by her gastroenterologist. Two rounds of stretching have been completed, with the most recent stretching to 10 mm. She is scheduled for a third round in six to eight weeks and has gained nine pounds since her last visit.  She experiences occasional nausea, described as occurring 'once in a blue moon in the morning.' She requests a refill of Zofran , which she prefers over Compazine  for managing nausea.  She reports swelling in her ankles and legs on both sides. No chest  pain, breathing issues, or coughing up blood. She also denies current nausea, vomiting, or diarrhea.        MEDICAL HISTORY: Past Medical History:  Diagnosis Date   Allergy    Anemia    Arthritis    Asthma    Blood transfusion without reported diagnosis    Cataract    COPD (chronic obstructive pulmonary disease) (HCC)    Duodenal ulcer    Emphysema of lung (HCC)    Family history of malignant neoplasm of gastrointestinal tract    GERD (gastroesophageal reflux disease)    Hepatitis    had in 6th grade pt unsure which kind   Hiatal hernia    HTN (hypertension) 10/03/2014   Hx of colonic polyps    Hyperlipemia    Internal hemorrhoids    Macular degeneration    Osteoporosis    Peripheral vascular disease (HCC)    Pneumonia    hx of   Shortness of breath dyspnea    with ambulation   Small cell carcinoma metastatic to right lung (HCC)    Stroke (HCC)    Trigeminal neuralgia    Vitamin D  deficiency     ALLERGIES:  is allergic to celebrex [celecoxib], decadron  [dexamethasone ], penicillins, crestor [rosuvastatin], doxycycline , hydrocodone -acetaminophen , and pravastatin.  MEDICATIONS:  Current Outpatient Medications  Medication Sig Dispense Refill   albuterol  (PROVENTIL ) (2.5 MG/3ML) 0.083% nebulizer solution Take 3 mLs (2.5 mg total) by nebulization every 6 (six) hours as needed for wheezing or shortness of breath. 75 mL 12   albuterol  (VENTOLIN  HFA) 108 (90 Base) MCG/ACT  inhaler 2 Inhalations 15 to 20  minutes apart every 4 hours to Rescue Asthma 18 g 3   AMBULATORY NON FORMULARY MEDICATION Medication Name: Magic mouthwash 1 part viscous lidocaine  2% 1 part Maalox 1 part Benadryl  12.5 mg per 5 mL elixir  Swish and swallow 5 cc every 6 hours as needed (Patient not taking: Reported on 01/20/2024) 300 mL 1   Ascorbic Acid (VITAMIN C) 1000 MG tablet Take 1,000 mg by mouth daily. Takes 7,000mg  (Patient not taking: Reported on 01/20/2024)     Cholecalciferol  (VITAMIN D ) 50 MCG  (2000 UT) CAPS Take by mouth. Takes 6,000 units (Patient not taking: Reported on 01/20/2024)     ezetimibe  (ZETIA ) 10 MG tablet Take 1 tablet (10 mg total) by mouth daily. (Patient not taking: Reported on 01/20/2024) 30 tablet 11   FeFum-FePoly-FA-B Cmp-C-Biot (INTEGRA PLUS ) CAPS Take 1 capsule by mouth every morning. (Patient not taking: Reported on 01/20/2024) 30 capsule 2   fluticasone -salmeterol (ADVAIR DISKUS) 250-50 MCG/ACT AEPB Inhale 1 puff into the lungs in the morning and at bedtime. (Patient not taking: Reported on 01/20/2024) 60 each 1   ipratropium-albuterol  (DUONEB) 0.5-2.5 (3) MG/3ML SOLN Take 3 mLs by nebulization every 4 (four) hours as needed. Max:6 doses per day 540 mL 0   magnesium  oxide (MAG-OX) 400 (240 Mg) MG tablet Take 1 tablet (400 mg total) by mouth daily. (Patient not taking: Reported on 01/20/2024) 30 tablet 0   omeprazole  (PRILOSEC) 40 MG capsule Take 1 capsule (40 mg total) by mouth 2 (two) times daily before a meal. 180 capsule 3   ondansetron  (ZOFRAN ) 8 MG tablet TAKE 1 TABLET (8 MG TOTAL) BY MOUTH EVERY 8 (EIGHT) HOURS AS NEEDED FOR NAUSEA OR VOMITING. STARTING 3 DAYS AFTER CHEMOTHERAPY 30 tablet 1   prochlorperazine  (COMPAZINE ) 10 MG tablet Take 1 tablet (10 mg total) by mouth every 6 (six) hours as needed. 30 tablet 2   sucralfate  (CARAFATE ) 1 g tablet Take 1 tablet (1 g total) by mouth 4 (four) times daily. Dissolve each tablet in 15 cc water before use. (Patient not taking: Reported on 01/20/2024) 120 tablet 2   Current Facility-Administered Medications  Medication Dose Route Frequency Provider Last Rate Last Admin   0.9 %  sodium chloride  infusion  500 mL Intravenous Once Cirigliano, Vito V, DO        SURGICAL HISTORY:  Past Surgical History:  Procedure Laterality Date   ABDOMINAL HYSTERECTOMY     APPENDECTOMY     BONE BIOPSY  02/14/2024   Procedure: BIOPSY;  Surgeon: Lajuan Pila, MD;  Location: [facility];  Service: Gastroenterology;;   CATARACT EXTRACTION,  BILATERAL     COLONOSCOPY     CRANIOTOMY     for trigeminal neuralgia   ELBOW SURGERY Right    ENDARTERECTOMY Right 12/16/2014   Procedure: ENDARTERECTOMY CAROTID with Dacron patch;  Surgeon: Mayo Speck, MD;  Location: [facility] OR;  Service: Vascular;  Laterality: Right;   ESOPHAGOGASTRODUODENOSCOPY  10/2008   ESOPHAGOGASTRODUODENOSCOPY N/A 02/14/2024   Procedure: EGD (ESOPHAGOGASTRODUODENOSCOPY);  Surgeon: Lajuan Pila, MD;  Location: [facility];  Service: Gastroenterology;  Laterality: N/A;   FINE NEEDLE ASPIRATION BIOPSY  07/25/2023   Procedure: FINE NEEDLE ASPIRATION BIOPSY;  Surgeon: Denson Flake, MD;  Location: [facility];  Service: Pulmonary;;   HAND SURGERY Left    LUMBAR DISC SURGERY     SAVORY DILATION N/A 01/26/2024   Procedure: EGD, WITH DILATION USING SAVARY-GILLIARD DILATOR OVER GUIDEWIRE;  Surgeon: Elois Hair, MD;  Location: [facility];  Service: Gastroenterology;  Laterality: N/A;  Needs fluro   TOTAL HIP ARTHROPLASTY Right    trigeminal neuralgia surgery     UPPER GASTROINTESTINAL ENDOSCOPY     VIDEO BRONCHOSCOPY WITH ENDOBRONCHIAL ULTRASOUND N/A 07/25/2023   Procedure: VIDEO BRONCHOSCOPY WITH ENDOBRONCHIAL ULTRASOUND;  Surgeon: Denson Flake, MD;  Location: [facility];  Service: Pulmonary;  Laterality: N/A;   WRIST SURGERY Left     REVIEW OF SYSTEMS:  Constitutional: positive for fatigue Eyes: negative Ears, nose, mouth, throat, and face: negative Respiratory: negative Cardiovascular: negative Gastrointestinal: positive for dysphagia, nausea, and odynophagia Genitourinary:negative Integument/breast: negative Hematologic/lymphatic: negative Musculoskeletal:negative Neurological: negative Behavioral/Psych: negative Endocrine: negative Allergic/Immunologic: negative   PHYSICAL EXAMINATION: General appearance: alert, cooperative, and no distress Head: Normocephalic, without obvious abnormality, atraumatic Neck: no adenopathy, no JVD, supple,  symmetrical, trachea midline, and thyroid  not enlarged, symmetric, no tenderness/mass/nodules Lymph nodes: Cervical, supraclavicular, and axillary nodes normal. Resp: clear to auscultation bilaterally Back: symmetric, no curvature. ROM normal. No CVA tenderness. Cardio: regular rate and rhythm, S1, S2 normal, no murmur, click, rub or gallop GI: soft, non-tender; bowel sounds normal; no masses,  no organomegaly Extremities: extremities normal, atraumatic, no cyanosis or edema Neurologic: Alert and oriented X 3, normal strength and tone. Normal symmetric reflexes. Normal coordination and gait  ECOG PERFORMANCE STATUS: 1 - Symptomatic but completely ambulatory  Blood pressure (!) 142/65, pulse 93, temperature 98.1 F (36.7 C), resp. rate 16, height 5\' 1"  (1.549 m), weight 87 lb (39.5 kg), SpO2 100%.  LABORATORY DATA: Lab Results  Component Value Date   WBC 3.1 (L) 02/13/2024   HGB 10.9 (L) 02/13/2024   HCT 31.2 (L) 02/13/2024   MCV 108.3 (H) 02/13/2024   PLT 62 (L) 02/13/2024      Chemistry      Component Value Date/Time   NA 142 02/13/2024 1410   NA 141 09/02/2016 1147   K 4.2 02/13/2024 1410   K 4.5 09/02/2016 1147   CL 113 (H) 02/13/2024 1410   CO2 24 02/13/2024 1410   CO2 20 (L) 09/02/2016 1147   BUN 10 02/13/2024 1410   BUN 13.1 09/02/2016 1147   CREATININE 0.69 02/13/2024 1410   CREATININE 0.70 05/11/2023 0930   CREATININE 0.8 09/02/2016 1147      Component Value Date/Time   CALCIUM 7.9 (L) 02/13/2024 1410   CALCIUM 9.7 09/02/2016 1147   ALKPHOS 116 02/13/2024 1410   ALKPHOS 77 09/02/2016 1147   AST 11 (L) 02/13/2024 1410   AST 16 09/02/2016 1147   ALT 5 02/13/2024 1410   ALT 9 09/02/2016 1147   BILITOT 0.1 02/13/2024 1410   BILITOT <0.22 09/02/2016 1147       RADIOGRAPHIC STUDIES: CT Chest W Contrast Result Date: 02/18/2024 CLINICAL DATA:  Small cell lung cancer, staging. History of lung cancer diagnosed in 2024. Chemotherapy and radiation therapy  completed. * Tracking Code: BO * EXAM: CT CHEST WITH CONTRAST TECHNIQUE: Multidetector CT imaging of the chest was performed during intravenous contrast administration. RADIATION DOSE REDUCTION: This exam was performed according to the departmental dose-optimization program which includes automated exposure control, adjustment of the mA and/or kV according to patient size and/or use of iterative reconstruction technique. CONTRAST:  75mL OMNIPAQUE  IOHEXOL  300 MG/ML  SOLN COMPARISON:  Chest CT 11/14/2023 and 07/08/2023.  PET-CT 07/19/2023. FINDINGS: Cardiovascular: No acute vascular findings. There is moderate atherosclerosis of the aorta, great vessels and coronary arteries. The heart size is normal. There is no pericardial effusion. Mediastinum/Nodes: Stable mildly  prominent right hilar lymph node measuring 8 mm short axis on image 69/2 and stable soft tissue edema throughout the middle mediastinum, likely treatment related. No enlarged mediastinal, hilar or axillary lymph nodes are identified. There is stable esophageal wall thickening, likely treatment related. The thyroid  gland and trachea appear unremarkable. Lungs/Pleura: New small dependent right pleural effusion. No left pleural effusion or pneumothorax. Mild centrilobular emphysema with biapical scarring and scattered central airway thickening. There are new patchy ground-glass opacities in both upper lobes, greater on the right, likely treatment related. Unchanged 5 mm nodule in the right middle lobe on image 113/7. No confluent airspace disease or enlarging pulmonary nodules. Upper abdomen: The visualized upper abdomen appears stable, without significant findings. Aortic and branch vessel atherosclerosis noted. Musculoskeletal/Chest wall: No chest wall mass or osseous metastases are identified. There is a new mild superior endplate compression deformity at T4 which demonstrates no pathologic features. Stable chronic superior endplate compression deformity  at L2. Stable chronic left-sided rib fractures and posttraumatic deformity of the right scapula. IMPRESSION: 1. No evidence of local recurrence or metastatic disease. 2. New patchy ground-glass opacities in both upper lobes, greater on the right, likely treatment related. Differential includes asymmetric edema and inflammation. Correlate clinically. 3. New small dependent right pleural effusion. 4. Stable mildly prominent right hilar lymph node and soft tissue edema throughout the middle mediastinum, likely treatment related. 5. New mild superior endplate compression deformity at T4 which demonstrates no pathologic features. The bones otherwise appear unchanged. 6. Aortic Atherosclerosis (ICD10-I70.0) and Emphysema (ICD10-J43.9). Electronically Signed   By: Elmon Hagedorn M.D.   On: 02/18/2024 13:27   DG ESOPHAGUS DILATION Result Date: 02/14/2024 CLINICAL DATA:  Esophageal dilatation. History of small cell lung cancer. EXAM: ESOPHAGEAL DILATATION - NO REPORT COMPARISON:  Chest CT 02/13/2024. FINDINGS: C-arm fluoroscopy was provided in the operating room without the presence of a radiologist.19 seconds fluoroscopy time. 0.74 mGy air kerma. Seven C-arm fluoroscopic images were obtained intraoperatively and are submitted for post operative interpretation. Images demonstrate balloon dilatation of the proximal to midthoracic esophagus. No complications are identified. Please see intraoperative findings for further detail. IMPRESSION: Intraoperative fluoroscopic guidance for esophageal dilatation. Electronically Signed   By: Elmon Hagedorn M.D.   On: 02/14/2024 15:07    ASSESSMENT AND PLAN: This is a very pleasant 68 years old white female with Limited stage small cell lung cancer (T1c, N2, M0) small cell lung cancer. The patient presented with a hilar mass and right paratracheal lymphadenopathy. She was diagnosed in October 2024. The patient underwent systemic chemotherapy with cisplatin  60 Mg/M2 on day 1 and  etoposide  90 Mg/M2 on days 1, 2 and 3 status post 4 cycles.  This was concurrent with radiotherapy for 6 weeks. The patient tolerated her treatment well except for the pancytopenia. She did not receive prophylactic cranial irradiation secondary to her poor condition after the treatment. She is currently on observation. She had repeat CT scan of the chest performed recently.  I personally and independently reviewed the scan and discussed the result with the patient and her daughter.     Limited stage small cell lung cancer Diagnosed in October 2024. Status post systemic chemotherapy with cisplatin  and etoposide  concurrent with radiation for four cycles. Prophylactic cranial irradiation was not completed due to her condition. Current CT scan shows inflammation near the radiation area and mild pleural effusion, but no new concerning findings. She is currently under observation. - Schedule follow-up CT scan in three months. - Consider prophylactic cranial irradiation  if her condition improves and she desires it, coordinate with Dr. Jeryl Moris.  Minimal right pleural effusion Minimal right pleural effusion on current imaging, likely related to recent treatment. Considered non-concerning at this time.  Dysphagia and odynophagia Experiencing dysphagia and odynophagia, under treatment by a gastroenterologist. Undergoing esophageal stretching procedures, with two rounds completed and a third planned in six to eight weeks. Weight gain indicates improvement. - Continue follow-up with gastroenterologist for esophageal stretching. - Monitor weight and nutritional intake.  Mild hypertension Blood pressure slightly elevated, not on antihypertensive medication. High salt intake may contribute to elevated blood pressure. - Advise home blood pressure monitoring. - Reduce salt intake. - Contact family doctor if blood pressure remains elevated for potential initiation of treatment.  Goals of Care Goals include  monitoring disease progression and considering further treatment options if condition improves. Open to prophylactic cranial irradiation if condition improves and she feels stronger.  Follow-up Plan established to monitor disease status and manage ongoing symptoms. - Schedule follow-up appointment in three months. - Provide refill for Zofran  as requested.    The patient voices understanding of current disease status and treatment options and is in agreement with the current care plan.  All questions were answered. The patient knows to call the clinic with any problems, questions or concerns. We can certainly see the patient much sooner if necessary.  Disclaimer: This note was dictated with voice recognition software. Similar sounding words can inadvertently be transcribed and may not be corrected upon review.

## 2024-02-21 ENCOUNTER — Other Ambulatory Visit: Payer: Self-pay

## 2024-02-21 DIAGNOSIS — R1319 Other dysphagia: Secondary | ICD-10-CM

## 2024-02-21 NOTE — Telephone Encounter (Signed)
 Pt scheduled for egd with balloon dil at [facility] 02/27/24 @10 :15am, pt to arrive there at 8:45am. Pt to be npo after midnight. Pt aware of appt with Dr Bridgett Camps, case ID [id number].

## 2024-02-21 NOTE — Telephone Encounter (Signed)
 Left message for pt to call back

## 2024-02-21 NOTE — Telephone Encounter (Signed)
 Ok with me

## 2024-02-22 NOTE — Telephone Encounter (Signed)
 Dr Venice Gillis see note below from Dr Elvin Hammer:  Elvin Hammer, Murel Arlington, MD  You; Lajuan Pila, MD; Lanis Pitcher, RN6 days ago    Thanks [facility]! We're making progress.   [name], Find a willing colleague to perform repeat EGD with dilation at the hospital in 2-3 weeks. Thanks  JP

## 2024-02-22 NOTE — Telephone Encounter (Signed)
 [name], She just had EGD 4/29 with dil Repeat EGD 5/12 might be too soon I would recommend repeating EGD in 6 to 8 weeks (atleast) with dil and to ensure healing of ulcers RG

## 2024-02-23 ENCOUNTER — Encounter (HOSPITAL_COMMUNITY): Payer: Self-pay | Admitting: Internal Medicine

## 2024-02-23 ENCOUNTER — Telehealth: Payer: Self-pay | Admitting: *Deleted

## 2024-02-23 NOTE — Telephone Encounter (Signed)
 CALLED PATIENT TO INFORM OF MRI FOR 03-05-24- ARRIVAL TIME- 2:45 PM @ [facility], NO RESTRICTIONS TO SCAN, LVM FOR A RETURN CALL

## 2024-02-25 ENCOUNTER — Encounter: Payer: Self-pay | Admitting: Internal Medicine

## 2024-02-27 ENCOUNTER — Encounter (HOSPITAL_COMMUNITY)
Admission: RE | Disposition: A | Discharge status: Home / Self Care | Payer: Self-pay | Source: Home / Self Care | Attending: Internal Medicine

## 2024-02-27 ENCOUNTER — Other Ambulatory Visit: Payer: Self-pay

## 2024-02-27 ENCOUNTER — Encounter (HOSPITAL_COMMUNITY): Payer: Self-pay | Admitting: Internal Medicine

## 2024-02-27 ENCOUNTER — Encounter: Payer: Self-pay | Admitting: Internal Medicine

## 2024-02-27 ENCOUNTER — Ambulatory Visit (HOSPITAL_COMMUNITY): Admitting: Anesthesiology

## 2024-02-27 ENCOUNTER — Ambulatory Visit (HOSPITAL_BASED_OUTPATIENT_CLINIC_OR_DEPARTMENT_OTHER): Admitting: Anesthesiology

## 2024-02-27 ENCOUNTER — Ambulatory Visit (HOSPITAL_COMMUNITY)
Admission: RE | Admit: 2024-02-27 | Discharge: 2024-02-27 | Disposition: A | Discharge status: Home / Self Care | Attending: Internal Medicine | Admitting: Internal Medicine

## 2024-02-27 DIAGNOSIS — F32A Depression, unspecified: Secondary | ICD-10-CM | POA: Diagnosis not present

## 2024-02-27 DIAGNOSIS — Z79899 Other long term (current) drug therapy: Secondary | ICD-10-CM | POA: Diagnosis not present

## 2024-02-27 DIAGNOSIS — E785 Hyperlipidemia, unspecified: Secondary | ICD-10-CM | POA: Insufficient documentation

## 2024-02-27 DIAGNOSIS — R0602 Shortness of breath: Secondary | ICD-10-CM | POA: Insufficient documentation

## 2024-02-27 DIAGNOSIS — Z85118 Personal history of other malignant neoplasm of bronchus and lung: Secondary | ICD-10-CM | POA: Insufficient documentation

## 2024-02-27 DIAGNOSIS — R1319 Other dysphagia: Secondary | ICD-10-CM

## 2024-02-27 DIAGNOSIS — X58XXXA Exposure to other specified factors, initial encounter: Secondary | ICD-10-CM | POA: Diagnosis not present

## 2024-02-27 DIAGNOSIS — J439 Emphysema, unspecified: Secondary | ICD-10-CM | POA: Diagnosis not present

## 2024-02-27 DIAGNOSIS — Z7951 Long term (current) use of inhaled steroids: Secondary | ICD-10-CM | POA: Diagnosis not present

## 2024-02-27 DIAGNOSIS — I272 Pulmonary hypertension, unspecified: Secondary | ICD-10-CM | POA: Diagnosis not present

## 2024-02-27 DIAGNOSIS — I739 Peripheral vascular disease, unspecified: Secondary | ICD-10-CM | POA: Insufficient documentation

## 2024-02-27 DIAGNOSIS — K219 Gastro-esophageal reflux disease without esophagitis: Secondary | ICD-10-CM | POA: Diagnosis not present

## 2024-02-27 DIAGNOSIS — M1990 Unspecified osteoarthritis, unspecified site: Secondary | ICD-10-CM | POA: Insufficient documentation

## 2024-02-27 DIAGNOSIS — I071 Rheumatic tricuspid insufficiency: Secondary | ICD-10-CM | POA: Diagnosis not present

## 2024-02-27 DIAGNOSIS — K449 Diaphragmatic hernia without obstruction or gangrene: Secondary | ICD-10-CM | POA: Diagnosis not present

## 2024-02-27 DIAGNOSIS — K222 Esophageal obstruction: Secondary | ICD-10-CM | POA: Diagnosis present

## 2024-02-27 DIAGNOSIS — I10 Essential (primary) hypertension: Secondary | ICD-10-CM

## 2024-02-27 DIAGNOSIS — Z87891 Personal history of nicotine dependence: Secondary | ICD-10-CM | POA: Diagnosis not present

## 2024-02-27 DIAGNOSIS — R1310 Dysphagia, unspecified: Secondary | ICD-10-CM

## 2024-02-27 DIAGNOSIS — K259 Gastric ulcer, unspecified as acute or chronic, without hemorrhage or perforation: Secondary | ICD-10-CM | POA: Diagnosis not present

## 2024-02-27 DIAGNOSIS — E782 Mixed hyperlipidemia: Secondary | ICD-10-CM

## 2024-02-27 HISTORY — PX: BALLOON DILATION: SHX5330

## 2024-02-27 HISTORY — PX: ESOPHAGOGASTRODUODENOSCOPY: SHX5428

## 2024-02-27 SURGERY — EGD (ESOPHAGOGASTRODUODENOSCOPY)
Anesthesia: Monitor Anesthesia Care

## 2024-02-27 MED ORDER — PROPOFOL 500 MG/50ML IV EMUL
INTRAVENOUS | Status: AC
Start: 2024-02-27 — End: ?
  Filled 2024-02-27: qty 50

## 2024-02-27 MED ORDER — PROPOFOL 500 MG/50ML IV EMUL
INTRAVENOUS | Status: DC | PRN
Start: 2024-02-27 — End: 2024-02-27
  Administered 2024-02-27: 10 mg via INTRAVENOUS
  Administered 2024-02-27: 40 mg via INTRAVENOUS
  Administered 2024-02-27: 120 ug/kg/min via INTRAVENOUS

## 2024-02-27 MED ORDER — SODIUM CHLORIDE 0.9 % IV SOLN
INTRAVENOUS | Status: DC
Start: 2024-02-27 — End: 2024-02-27

## 2024-02-27 MED ORDER — LIDOCAINE HCL (PF) 2 % IJ SOLN
INTRAMUSCULAR | Status: DC | PRN
  Administered 2024-02-27: 60 mg via INTRADERMAL

## 2024-02-27 NOTE — Discharge Instructions (Signed)

## 2024-02-27 NOTE — Anesthesia Procedure Notes (Signed)
 Procedure Name: MAC Date/Time: 02/27/2024 9:36 AM  Performed by: Manuela Sella, CRNAPre-anesthesia Checklist: Patient identified, Emergency Drugs available, Suction available and Patient being monitored Patient Re-evaluated:Patient Re-evaluated prior to induction Oxygen  Delivery Method: Simple face mask Preoxygenation: Pre-oxygenation with 100% oxygen  Placement Confirmation: positive ETCO2 and breath sounds checked- equal and bilateral Dental Injury: Teeth and Oropharynx as per pre-operative assessment

## 2024-02-27 NOTE — Anesthesia Preprocedure Evaluation (Addendum)
 Anesthesia Evaluation  Patient identified by MRN, date of birth, ID band Patient awake    Reviewed: Allergy & Precautions, NPO status , Patient's Chart, lab work & pertinent test results  History of Anesthesia Complications Negative for: history of anesthetic complications  Airway Mallampati: I  TM Distance: >3 FB Neck ROM: Full    Dental  (+) Edentulous Upper, Edentulous Lower, Lower Dentures, Upper Dentures, Dental Advisory Given   Pulmonary shortness of breath, COPD,  COPD inhaler, former smoker Lung cancer    breath sounds clear to auscultation       Cardiovascular hypertension (no longer on BP meds), pulmonary hypertension+ Peripheral Vascular Disease   Rhythm:Regular Rate:Normal  '18 ECHO  - Left ventricle: The cavity size was normal. Wall thickness was    normal. Systolic function was normal. EF 55% to 60%. Wall motion was normal;    there were no regional wall motion abnormalities. There was no    evidence of elevated ventricular filling pressure by Doppler parameters.  - Right atrium: The atrium was mildly dilated.  - Tricuspid valve: There was mild regurgitation.  - Pulmonary arteries: Systolic pressure was mildly to moderately    increased. PA peak pressure: 46 mm Hg (S).     Neuro/Psych    Depression    TIA   GI/Hepatic hiatal hernia, PUD,GERD  Medicated and Controlled,,(+) Hepatitis -H/o UGI bleed   Endo/Other  negative endocrine ROS    Renal/GU negative Renal ROS     Musculoskeletal  (+) Arthritis ,    Abdominal   Peds  Hematology Hb 10.9, plt 62k   Anesthesia Other Findings   Reproductive/Obstetrics                             Anesthesia Physical Anesthesia Plan  ASA: 4  Anesthesia Plan: MAC   Post-op Pain Management: Minimal or no pain anticipated   Induction:   PONV Risk Score and Plan: 2 and Treatment may vary due to age or medical condition  Airway  Management Planned: Natural Airway and Simple Face Mask  Additional Equipment: None  Intra-op Plan:   Post-operative Plan:   Informed Consent: I have reviewed the patients History and Physical, chart, labs and discussed the procedure including the risks, benefits and alternatives for the proposed anesthesia with the patient or authorized representative who has indicated his/her understanding and acceptance.     Dental advisory given  Plan Discussed with: CRNA and Surgeon  Anesthesia Plan Comments:         Anesthesia Quick Evaluation

## 2024-02-27 NOTE — Anesthesia Postprocedure Evaluation (Signed)
 Anesthesia Post Note  Patient: [name]  Procedure(s) Performed: EGD (ESOPHAGOGASTRODUODENOSCOPY) BALLOON DILATION     Patient location during evaluation: Endoscopy Anesthesia Type: MAC Level of consciousness: awake and alert, patient cooperative and oriented Pain management: pain level controlled Vital Signs Assessment: post-procedure vital signs reviewed and stable Respiratory status: spontaneous breathing, nonlabored ventilation and respiratory function stable Cardiovascular status: stable and blood pressure returned to baseline Postop Assessment: no apparent nausea or vomiting Anesthetic complications: no   No notable events documented.  Last Vitals:  Vitals:   02/27/24 1020 02/27/24 1030  BP: (!) 92/52 (!) 92/54  Pulse: 82 82  Resp: 17 18  Temp:    SpO2: 94% 94%    Last Pain:  Vitals:   02/27/24 1030  TempSrc:   PainSc: 0-No pain                 Camren Henthorn,E. Dotty Gonzalo

## 2024-02-27 NOTE — Op Note (Signed)
 [facility] Patient Name: [name] Procedure Date: 02/27/2024 MRN: [id number] Attending MD: Nannette Babe , MD, [id number] Date of Birth: Apr 15, 1956 CSN: [id number] Age: 68 Admit Type: Outpatient Procedure:                Upper GI endoscopy Indications:              Dysphagia, For therapy of esophageal stenosis                            secondary to radiation-induced stricture; last                            dilation 02/14/2024, 01/26/2024; patient reports                            improving dysphagia though still dysphagia to                            solids particularly meats. Providers:                Amber Bail. Bridgett Camps, MD, Ila Malay, RN, Arlin Benes, Technician, Manuela Sella, CRNA Referring MD:             Marlene Simas Medicines:                Monitored Anesthesia Care Complications:            No immediate complications. Estimated Blood Loss:     Estimated blood loss was minimal. Procedure:                Pre-Anesthesia Assessment:                           - Prior to the procedure, a History and Physical                            was performed, and patient medications and                            allergies were reviewed. The patient's tolerance of                            previous anesthesia was also reviewed. The risks                            and benefits of the procedure and the sedation                            options and risks were discussed with the patient.                            All questions were answered, and informed consent  was obtained. Prior Anticoagulants: The patient has                            taken no anticoagulant or antiplatelet agents. ASA                            Grade Assessment: III - A patient with severe                            systemic disease. After reviewing the risks and                            benefits, the patient was deemed in  satisfactory                            condition to undergo the procedure.                           After obtaining informed consent, the endoscope was                            passed under direct vision. Throughout the                            procedure, the patient's blood pressure, pulse, and                            oxygen  saturations were monitored continuously. The                            GIF-H190 ([id number]) Olympus endoscope was introduced                            through the mouth, and advanced to the second part                            of duodenum. The upper GI endoscopy was                            accomplished without difficulty. The patient                            tolerated the procedure well. Scope In: Scope Out: Findings:      One benign-appearing, intrinsic severe (stenosis; an endoscope cannot       pass) stenosis was found 24 to 26 cm from the incisors. This stenosis       measured 7 mm (inner diameter) x 2 cm (in length). The stenosis was       traversed after dilation. A TTS dilator was passed through the scope.       Dilation with a 07-29-11 mm balloon dilator was performed to 11 mm. The       dilation site was examined and showed moderate mucosal disruption and       moderate improvement in luminal narrowing.  A small hiatal hernia was present.      Two non-bleeding cratered gastric ulcers with no stigmata of bleeding       were found in the gastric antrum. The largest lesion was 7 mm in largest       dimension.      The examined duodenum was normal. Impression:               - Mid-esophageal radiation-induced stricture.                            Dilated with balloon to 11 mm.                           - Small hiatal hernia.                           - Non-bleeding gastric ulcers with no stigmata of                            bleeding. Biopsied at last EGD and negative for                            dysplasia and H. Pylori infection.                            - Normal examined duodenum.                           - No specimens collected. Moderate Sedation:      Not Applicable - Patient had care per Anesthesia. Recommendation:           - Patient has a contact number available for                            emergencies. The signs and symptoms of potential                            delayed complications were discussed with the                            patient. Return to normal activities tomorrow.                            Written discharge instructions were provided to the                            patient.                           - Advance diet as tolerated.                           - Continue present medications.                           - Repeat upper endoscopy in 3 weeks in the  outpatient hospital setting for retreatment (Dr.                            Elvin Hammer or available [facility] provider). Procedure Code(s):        --- Professional ---                           [phone], Esophagogastroduodenoscopy, flexible,                            transoral; with transendoscopic balloon dilation of                            esophagus (less than 30 mm diameter) Diagnosis Code(s):        --- Professional ---                           K22.2, Esophageal obstruction                           K44.9, Diaphragmatic hernia without obstruction or                            gangrene                           K25.9, Gastric ulcer, unspecified as acute or                            chronic, without hemorrhage or perforation                           R13.10, Dysphagia, unspecified CPT copyright 2022 American Medical Association. All rights reserved. The codes documented in this report are preliminary and upon coder review may  be revised to meet current compliance requirements. Nannette Babe, MD 02/27/2024 10:08:43 AM This report has been signed electronically. Number of Addenda: 0

## 2024-02-27 NOTE — H&P (Signed)
 GASTROENTEROLOGY PROCEDURE H&P NOTE   Primary Care Physician: Patient, No Pcp Per    Reason for Procedure:  Dysphagia, known radiation-induced esophageal stricture and for therapy of the same  Plan:    Upper endoscopy with dilation  Patient is appropriate for endoscopic procedure(s) in the outpatient hospital setting.  The nature of the procedure, as well as the risks, benefits, and alternatives were carefully and thoroughly reviewed with the patient. Ample time for discussion and questions allowed. The patient understood, was satisfied, and agreed to proceed.     HPI: [name] is a 68 y.o. female who presents for EGD with dilation.  Medical history as below.    No recent chest pain or shortness of breath.  No abdominal pain today.  Swallowing has been improving with each dilation.  Still not able to eat meat but able to eat some solid foods without odynophagia.  No chest pain or shortness of breath today.  Past Medical History:  Diagnosis Date   Allergy    Anemia    Arthritis    Asthma    Blood transfusion without reported diagnosis    Cataract    COPD (chronic obstructive pulmonary disease) (HCC)    Duodenal ulcer    Emphysema of lung (HCC)    Family history of malignant neoplasm of gastrointestinal tract    GERD (gastroesophageal reflux disease)    Hepatitis    had in 6th grade pt unsure which kind   Hiatal hernia    HTN (hypertension) 10/03/2014   Hx of colonic polyps    Hyperlipemia    Internal hemorrhoids    Macular degeneration    Osteoporosis    Peripheral vascular disease (HCC)    Pneumonia    hx of   Shortness of breath dyspnea    with ambulation   Small cell carcinoma metastatic to right lung (HCC)    Stroke (HCC)    Trigeminal neuralgia    Vitamin D  deficiency     Past Surgical History:  Procedure Laterality Date   ABDOMINAL HYSTERECTOMY     APPENDECTOMY     BONE BIOPSY  02/14/2024   Procedure: BIOPSY;  Surgeon: Lajuan Pila, MD;   Location: [facility]OSCOPY;  Service: Gastroenterology;;   CATARACT EXTRACTION, BILATERAL     COLONOSCOPY     CRANIOTOMY     for trigeminal neuralgia   ELBOW SURGERY Right    ENDARTERECTOMY Right 12/16/2014   Procedure: ENDARTERECTOMY CAROTID with Dacron patch;  Surgeon: Mayo Speck, MD;  Location: [facility] OR;  Service: Vascular;  Laterality: Right;   ESOPHAGOGASTRODUODENOSCOPY  10/2008   ESOPHAGOGASTRODUODENOSCOPY N/A 02/14/2024   Procedure: EGD (ESOPHAGOGASTRODUODENOSCOPY);  Surgeon: Lajuan Pila, MD;  Location: [facility];  Service: Gastroenterology;  Laterality: N/A;   FINE NEEDLE ASPIRATION BIOPSY  07/25/2023   Procedure: FINE NEEDLE ASPIRATION BIOPSY;  Surgeon: Denson Flake, MD;  Location: [facility] ENDOSCOPY;  Service: Pulmonary;;   HAND SURGERY Left    LUMBAR DISC SURGERY     SAVORY DILATION N/A 01/26/2024   Procedure: EGD, WITH DILATION USING SAVARY-GILLIARD DILATOR OVER GUIDEWIRE;  Surgeon: Elois Hair, MD;  Location: [facility] ENDOSCOPY;  Service: Gastroenterology;  Laterality: N/A;  Needs fluro   TOTAL HIP ARTHROPLASTY Right    trigeminal neuralgia surgery     UPPER GASTROINTESTINAL ENDOSCOPY     VIDEO BRONCHOSCOPY WITH ENDOBRONCHIAL ULTRASOUND N/A 07/25/2023   Procedure: VIDEO BRONCHOSCOPY WITH ENDOBRONCHIAL ULTRASOUND;  Surgeon: Denson Flake, MD;  Location: [facility] ENDOSCOPY;  Service: Pulmonary;  Laterality: N/A;   WRIST SURGERY Left     Prior to Admission medications   Medication Sig Start Date End Date Taking? Authorizing Provider  albuterol  (PROVENTIL ) (2.5 MG/3ML) 0.083% nebulizer solution Take 3 mLs (2.5 mg total) by nebulization every 6 (six) hours as needed for wheezing or shortness of breath. 06/13/23  Yes Cranford, Lynnie Saucier, NP  albuterol  (VENTOLIN  HFA) 108 (90 Base) MCG/ACT inhaler 2 Inhalations 15 to 20  minutes apart every 4 hours to Rescue Asthma 05/11/23  Yes Cranford, Lynnie Saucier, NP  Ascorbic Acid (VITAMIN C) 1000 MG tablet Take 1,000 mg by mouth daily. Takes 7,000mg    Yes  [provider]  Cholecalciferol  (VITAMIN D ) 50 MCG (2000 UT) CAPS Take by mouth. Takes 6,000 units   Yes [provider]  ezetimibe  (ZETIA ) 10 MG tablet Take 1 tablet (10 mg total) by mouth daily. 03/18/22 02/27/24 Yes Wilkinson, Dana E, FNP  FeFum-FePoly-FA-B Cmp-C-Biot (INTEGRA PLUS ) CAPS Take 1 capsule by mouth every morning. 08/02/23  Yes Heilingoetter, Cassandra L, PA-C  fluticasone -salmeterol (ADVAIR DISKUS) 250-50 MCG/ACT AEPB Inhale 1 puff into the lungs in the morning and at bedtime. 03/18/22  Yes Wilkinson, Dana E, FNP  ipratropium-albuterol  (DUONEB) 0.5-2.5 (3) MG/3ML SOLN Take 3 mLs by nebulization every 4 (four) hours as needed. Max:6 doses per day 05/11/23  Yes Cranford, Tonya, NP  omeprazole  (PRILOSEC) 40 MG capsule Take 1 capsule (40 mg total) by mouth 2 (two) times daily before a meal. 01/04/24  Yes May, Deanna J, NP  ondansetron  (ZOFRAN ) 8 MG tablet Take 1 tablet (8 mg total) by mouth every 8 (eight) hours as needed for nausea or vomiting. Starting 3 days after chemotherapy 02/20/24  Yes Marlene Simas, MD  prochlorperazine  (COMPAZINE ) 10 MG tablet Take 1 tablet (10 mg total) by mouth every 6 (six) hours as needed. 08/01/23  Yes Heilingoetter, Cassandra L, PA-C  sucralfate  (CARAFATE ) 1 g tablet Take 1 tablet (1 g total) by mouth 4 (four) times daily. Dissolve each tablet in 15 cc water before use. 08/19/23  Yes Johna Myers, MD  AMBULATORY NON FORMULARY MEDICATION Medication Name: Magic mouthwash 1 part viscous lidocaine  2% 1 part Maalox 1 part Benadryl  12.5 mg per 5 mL elixir  Swish and swallow 5 cc every 6 hours as needed Patient not taking: Reported on 01/20/2024 11/07/23   May, Deanna J, NP  magnesium  oxide (MAG-OX) 400 (240 Mg) MG tablet Take 1 tablet (400 mg total) by mouth daily. Patient not taking: Reported on 01/20/2024 10/10/23   Heilingoetter, Cassandra L, PA-C    Current Facility-Administered Medications  Medication Dose Route Frequency Provider Last Rate Last  Admin   0.9 %  sodium chloride  infusion   Intravenous Continuous Maxtyn Nuzum, Amber Bail, MD        Allergies as of 02/21/2024 - Review Complete 02/14/2024  Allergen Reaction Noted   Celebrex [celecoxib] Other (See Comments) 10/15/2013   Decadron  [dexamethasone ] Other (See Comments) 10/24/2019   Penicillins Other (See Comments) 12/13/2008   Crestor [rosuvastatin] Other (See Comments) 12/21/2012   Doxycycline  Other (See Comments) 03/18/2022   Hydrocodone -acetaminophen  Hives, Itching, and Rash 03/18/2022   Pravastatin Other (See Comments) 10/15/2013    Family History  Problem Relation Age of Onset   Colon polyps Mother    Inflammatory bowel disease Mother    Hypertension Mother    Stroke Mother    Heart disease Mother    Hyperlipidemia Mother    Varicose Veins Mother    Heart attack Mother    AAA (abdominal aortic  aneurysm) Mother    Hypertension Father    Hyperlipidemia Father    Heart disease Father    Heart attack Father    Heart disease Brother    Hyperlipidemia Brother    Hypertension Brother    Heart attack Brother    AAA (abdominal aortic aneurysm) Brother    Heart disease Maternal Grandmother    Diabetes Maternal Grandfather    Heart disease Maternal Grandfather    Varicose Veins Daughter    Colon cancer Other        mat great aunt   Esophageal cancer Neg Hx    Stomach cancer Neg Hx    Rectal cancer Neg Hx     Social History   Socioeconomic History   Marital status: Widowed    Spouse name: Not on file   Number of children: 1   Years of education: Not on file   Highest education level: Not on file  Occupational History   Occupation: disability  Tobacco Use   Smoking status: Former    Current packs/day: 0.00    Average packs/day: 0.3 packs/day for 36.0 years (9.0 ttl pk-yrs)    Types: Cigarettes    Start date: 09/14/1981    Quit date: 09/14/2017    Years since quitting: 6.4   Smokeless tobacco: Never   Tobacco comments:    1-2 sometimes not daily  Vaping  Use   Vaping status: Never Used  Substance and Sexual Activity   Alcohol use: Not Currently    Alcohol/week: 0.0 standard drinks of alcohol   Drug use: No   Sexual activity: Not Currently  Other Topics Concern   Not on file  Social History Narrative   Not on file   Social Drivers of Health   Financial Resource Strain: Unknown (09/18/2017)   Overall Financial Resource Strain (CARDIA)    Difficulty of Paying Living Expenses: Patient declined  Food Insecurity: Unknown (09/18/2017)   Hunger Vital Sign    Worried About Running Out of Food in the Last Year: Patient declined    Ran Out of Food in the Last Year: Patient declined  Transportation Needs: Unknown (09/18/2017)   PRAPARE - Administrator, Civil Service (Medical): Patient declined    Lack of Transportation (Non-Medical): Patient declined  Physical Activity: Unknown (09/18/2017)   Exercise Vital Sign    Days of Exercise per Week: Patient declined    Minutes of Exercise per Session: Patient declined  Stress: Stress Concern Present (09/18/2017)   [employer] of Occupational Health - Occupational Stress Questionnaire    Feeling of Stress : To some extent  Social Connections: Unknown (09/18/2017)   Social Connection and Isolation Panel [NHANES]    Frequency of Communication with Friends and Family: Patient declined    Frequency of Social Gatherings with Friends and Family: Patient declined    Attends Religious Services: Patient declined    Database administrator or Organizations: Patient declined    Attends Banker Meetings: Patient declined    Marital Status: Patient declined  Intimate Partner Violence: Unknown (09/18/2017)   Humiliation, Afraid, Rape, and Kick questionnaire    Fear of Current or Ex-Partner: Patient declined    Emotionally Abused: Patient declined    Physically Abused: Patient declined    Sexually Abused: Patient declined    Physical Exam: Vital signs in last 24 hours: @BP  (!)  103/49   Pulse 88   Temp (!) 97.3 F (36.3 C) (Temporal)   Resp (!) 21  Ht 5\' 1"  (1.549 m)   Wt 39.5 kg   SpO2 96%   BMI 16.44 kg/m  GEN: NAD EYE: Sclerae anicteric ENT: MMM CV: Non-tachycardic Pulm: CTA b/l GI: Soft, NT/ND NEURO:  Alert & Oriented x 3   Laurell Pond, MD [facility]  02/27/2024 9:21 AM

## 2024-02-27 NOTE — Transfer of Care (Signed)
 Immediate Anesthesia Transfer of Care Note  Patient: [name]  Procedure(s) Performed: Procedure(s): EGD (ESOPHAGOGASTRODUODENOSCOPY) (N/A) BALLOON DILATION (N/A)  Patient Location: PACU  Anesthesia Type:MAC  Level of Consciousness: Patient easily awoken, comfortable, cooperative, following commands, responds to stimulation.   Airway & Oxygen  Therapy: Patient spontaneously breathing, ventilating well, oxygen  via simple oxygen  mask.  Post-op Assessment: Report given to PACU RN, vital signs reviewed and stable, moving all extremities.   Post vital signs: Reviewed and stable.  Complications: No apparent anesthesia complications Last Vitals:  Vitals Value Taken Time  BP 106/50 02/27/24 1000  Temp    Pulse 87 02/27/24 1001  Resp 20 02/27/24 1001  SpO2 100 % 02/27/24 1001  Vitals shown include unfiled device data.  Last Pain:  Vitals:   02/27/24 0843  TempSrc: Temporal  PainSc: 0-No pain         Complications: No notable events documented.

## 2024-02-28 ENCOUNTER — Encounter (HOSPITAL_COMMUNITY): Payer: Self-pay | Admitting: Internal Medicine

## 2024-03-05 ENCOUNTER — Ambulatory Visit (HOSPITAL_COMMUNITY): Admission: RE | Admit: 2024-03-05 | Source: Ambulatory Visit

## 2024-03-05 ENCOUNTER — Inpatient Hospital Stay

## 2024-03-05 NOTE — Progress Notes (Signed)
 Nutrition Follow-up:   Patient with small cell lung cancer.  Patient has completed concurrent chemotherapy and radiation.  Patient s/p EGD with dilation on 5/12 and 4/10.    Spoke with patient via phone. Reports that she is just getting over pneumonia.  Has not felt good in the last 2 weeks.  Reports that she is still drinking ensure shakes and eating soft foods (pudding, jello, ice cream, milkshakes, soups, mashed potatoes, peanut butter crackers, fruit, green beans.      Medications: reviewed  Labs: reviewed  Anthropometrics:   Weight 87 lb   83 lb on home scale 4/21 78 lb on 3/24 79 lb on 2/24 77 lb on 1/30 80 lb 8 oz on 1/13 79 lb on 1/6  NUTRITION DIAGNOSIS: Inadequate oral intake improving and weight increasing   INTERVENTION:  Continue ensure plus 3-4 times a day Continue high calorie, soft, moist foods. Utilize blender if possible    MONITORING, EVALUATION, GOAL: weight trends, intake   NEXT VISIT: as needed Patient will call RD if needed   Lauran Romanski B. Zollie Hipp, CSO, LDN Registered Dietitian [phone]

## 2024-03-07 NOTE — Telephone Encounter (Signed)
 Attempted to call pt but received message that voice mailbox is full and cannot accept new messages. Will try again.

## 2024-03-09 ENCOUNTER — Other Ambulatory Visit (HOSPITAL_BASED_OUTPATIENT_CLINIC_OR_DEPARTMENT_OTHER): Payer: Self-pay

## 2024-03-09 ENCOUNTER — Ambulatory Visit (HOSPITAL_BASED_OUTPATIENT_CLINIC_OR_DEPARTMENT_OTHER)
Admit: 2024-03-09 | Discharge: 2024-03-09 | Disposition: A | Discharge status: Home / Self Care | Attending: Family Medicine | Admitting: Family Medicine

## 2024-03-09 ENCOUNTER — Encounter: Payer: Self-pay | Admitting: Physician Assistant

## 2024-03-09 ENCOUNTER — Ambulatory Visit (HOSPITAL_BASED_OUTPATIENT_CLINIC_OR_DEPARTMENT_OTHER)

## 2024-03-09 ENCOUNTER — Encounter (HOSPITAL_BASED_OUTPATIENT_CLINIC_OR_DEPARTMENT_OTHER): Payer: Self-pay

## 2024-03-09 ENCOUNTER — Encounter: Payer: Self-pay | Admitting: Internal Medicine

## 2024-03-09 ENCOUNTER — Ambulatory Visit (HOSPITAL_BASED_OUTPATIENT_CLINIC_OR_DEPARTMENT_OTHER)
Admission: EM | Admit: 2024-03-09 | Discharge: 2024-03-09 | Disposition: A | Discharge status: Home / Self Care | Attending: Family Medicine | Admitting: Family Medicine

## 2024-03-09 DIAGNOSIS — J189 Pneumonia, unspecified organism: Secondary | ICD-10-CM | POA: Diagnosis not present

## 2024-03-09 DIAGNOSIS — R531 Weakness: Secondary | ICD-10-CM | POA: Diagnosis not present

## 2024-03-09 LAB — POCT URINALYSIS DIP (MANUAL ENTRY)
Bilirubin, UA: NEGATIVE
Blood, UA: NEGATIVE
Glucose, UA: NEGATIVE mg/dL
Ketones, POC UA: NEGATIVE mg/dL
Leukocytes, UA: NEGATIVE
Nitrite, UA: NEGATIVE
Protein Ur, POC: NEGATIVE mg/dL
Spec Grav, UA: 1.025 (ref 1.010–1.025)
Urobilinogen, UA: 0.2 U/dL
pH, UA: 5.5 (ref 5.0–8.0)

## 2024-03-09 MED ORDER — AZITHROMYCIN 250 MG PO TABS
ORAL_TABLET | ORAL | 0 refills | Status: DC
  Filled 2024-03-09: qty 6, 5d supply, fill #0

## 2024-03-09 MED ORDER — TRIAMCINOLONE ACETONIDE 40 MG/ML IJ SUSP
40.0000 mg | Freq: Once | INTRAMUSCULAR | Status: AC
  Administered 2024-03-09: 40 mg via INTRAMUSCULAR

## 2024-03-09 MED ORDER — CEFTRIAXONE SODIUM 500 MG IJ SOLR
500.0000 mg | INTRAMUSCULAR | Status: DC
  Administered 2024-03-09: 500 mg via INTRAMUSCULAR

## 2024-03-09 MED ORDER — NYSTATIN 100000 UNIT/ML MT SUSP
500000.0000 [IU] | Freq: Four times a day (QID) | OROMUCOSAL | 0 refills | Status: DC
  Filled 2024-03-09: qty 60, 3d supply, fill #0

## 2024-03-09 NOTE — ED Triage Notes (Signed)
 Onset 2 weeks of cold like symptoms. Was prescribed Levaquin  and completed treatment. Patient completed treatment for lung cancer in January.  Patient weak, confused, moist cough. States difficulty walking as well.

## 2024-03-09 NOTE — Discharge Instructions (Signed)
 Treating you for a possible atypical pneumonia.  Take the antibiotics as prescribed.  Antibiotic injection given here along with steroid injection. If symptoms worsen you will need to go straight to the ER.

## 2024-03-10 NOTE — ED Provider Notes (Signed)
 [name]    CSN: [id number] Arrival date & time: 03/09/24  1414      History   Chief Complaint Chief Complaint  Patient presents with   Cough   Weakness   Sore Throat    HPI [name] is a 68 y.o. female.   Patient is a 68 year old female presents today for cough, congestion, weakness.  Approximately 2 weeks ago symptoms started and was most recently treated for potential pneumonia with Levaquin .  She did not see any change with this.  She was also given steroid injection in same-day.  Feels like symptoms are worsening.  Her weakness has worsened.  History of hypoxia and had to be hospitalized previously.  Patient was recently finished treatment for lung cancer in January. No fever, SOB.    Cough Weakness Associated symptoms: cough   Sore Throat    Past Medical History:  Diagnosis Date   Allergy    Anemia    Arthritis    Asthma    Blood transfusion without reported diagnosis    Cataract    COPD (chronic obstructive pulmonary disease) (HCC)    Duodenal ulcer    Emphysema of lung (HCC)    Family history of malignant neoplasm of gastrointestinal tract    GERD (gastroesophageal reflux disease)    Hepatitis    had in 6th grade pt unsure which kind   Hiatal hernia    HTN (hypertension) 10/03/2014   Hx of colonic polyps    Hyperlipemia    Internal hemorrhoids    Macular degeneration    Osteoporosis    Peripheral vascular disease (HCC)    Pneumonia    hx of   Shortness of breath dyspnea    with ambulation   Small cell carcinoma metastatic to right lung (HCC)    Stroke [facility])    Trigeminal neuralgia    Vitamin D  deficiency     Patient Active Problem List   Diagnosis Date Noted   Radiation-induced esophageal stricture 01/20/2024   Esophageal dysphagia 01/20/2024   Encounter for antineoplastic chemotherapy 10/31/2023   Thrombocytopenia (HCC) 10/24/2023   Chemotherapy induced neutropenia (HCC) 08/24/2023   Small cell lung cancer (HCC)  08/01/2023   Hilar mass 07/20/2023   Tobacco use 07/20/2023   Statin intolerance 09/18/2018   Thoracic aorta atherosclerosis (HCC) 11/30/2016   TIA (transient ischemic attack) 08/19/2016   Iron  deficiency anemia 07/20/2016   Protein-calorie malnutrition, moderate (HCC) 06/23/2016   Osteoporosis 01/02/2015   Depression, major, in remission (HCC) 01/02/2015   Carotid stenosis, asymptomatic 12/16/2014   HTN (hypertension) 10/03/2014   Medication management 10/03/2014   COPD (chronic obstructive pulmonary disease) (HCC)    Asthma-COPD overlap syndrome (HCC)    Vitamin D  deficiency    Mixed hyperlipidemia 04/30/2008   GERD 04/30/2008   Recurrent Upper GI bleed 04/30/2008    Past Surgical History:  Procedure Laterality Date   ABDOMINAL HYSTERECTOMY     APPENDECTOMY     BALLOON DILATION N/A 02/27/2024   Procedure: BALLOON DILATION;  Surgeon: Nannette Babe, MD;  Location: [facility] ENDOSCOPY;  Service: Gastroenterology;  Laterality: N/A;   BONE BIOPSY  02/14/2024   Procedure: BIOPSY;  Surgeon: Lajuan Pila, MD;  Location: [facility] ENDOSCOPY;  Service: Gastroenterology;;   CATARACT EXTRACTION, BILATERAL     COLONOSCOPY     CRANIOTOMY     for trigeminal neuralgia   ELBOW SURGERY Right    ENDARTERECTOMY Right 12/16/2014   Procedure: ENDARTERECTOMY CAROTID with Dacron patch;  Surgeon: Sherre Docker  Early, MD;  Location: [facility] OR;  Service: Vascular;  Laterality: Right;   ESOPHAGOGASTRODUODENOSCOPY  10/2008   ESOPHAGOGASTRODUODENOSCOPY N/A 02/14/2024   Procedure: EGD (ESOPHAGOGASTRODUODENOSCOPY);  Surgeon: Lajuan Pila, MD;  Location: [facility];  Service: Gastroenterology;  Laterality: N/A;   ESOPHAGOGASTRODUODENOSCOPY N/A 02/27/2024   Procedure: EGD (ESOPHAGOGASTRODUODENOSCOPY);  Surgeon: Nannette Babe, MD;  Location: [facility];  Service: Gastroenterology;  Laterality: N/A;   FINE NEEDLE ASPIRATION BIOPSY  07/25/2023   Procedure: FINE NEEDLE ASPIRATION BIOPSY;  Surgeon: Denson Flake, MD;  Location: [facility]  ENDOSCOPY;  Service: Pulmonary;;   HAND SURGERY Left    LUMBAR DISC SURGERY     SAVORY DILATION N/A 01/26/2024   Procedure: EGD, WITH DILATION USING SAVARY-GILLIARD DILATOR OVER GUIDEWIRE;  Surgeon: Elois Hair, MD;  Location: [facility] ENDOSCOPY;  Service: Gastroenterology;  Laterality: N/A;  Needs fluro   TOTAL HIP ARTHROPLASTY Right    trigeminal neuralgia surgery     UPPER GASTROINTESTINAL ENDOSCOPY     VIDEO BRONCHOSCOPY WITH ENDOBRONCHIAL ULTRASOUND N/A 07/25/2023   Procedure: VIDEO BRONCHOSCOPY WITH ENDOBRONCHIAL ULTRASOUND;  Surgeon: Denson Flake, MD;  Location: [facility] ENDOSCOPY;  Service: Pulmonary;  Laterality: N/A;   WRIST SURGERY Left     OB History   No obstetric history on file.      Home Medications    Prior to Admission medications   Medication Sig Start Date End Date Taking? Authorizing Provider  azithromycin  (ZITHROMAX ) 250 MG tablet Take 2 tablets (500 mg total) by mouth daily for 1 day, THEN 1 tablet (250 mg total) daily for 4 days. 03/09/24 03/14/24 Yes Malon Siddall A, FNP  nystatin  (MYCOSTATIN ) 100000 UNIT/ML suspension Take 5 mLs (500,000 Units total) by mouth 4 (four) times daily. 03/09/24  Yes Tanny Harnack A, FNP  albuterol  (PROVENTIL ) (2.5 MG/3ML) 0.083% nebulizer solution Take 3 mLs (2.5 mg total) by nebulization every 6 (six) hours as needed for wheezing or shortness of breath. 06/13/23   Langley Pippin, NP  albuterol  (VENTOLIN  HFA) 108 (90 Base) MCG/ACT inhaler 2 Inhalations 15 to 20  minutes apart every 4 hours to Rescue Asthma 05/11/23   Langley Pippin, NP  AMBULATORY NON FORMULARY MEDICATION Medication Name: Magic mouthwash 1 part viscous lidocaine  2% 1 part Maalox 1 part Benadryl  12.5 mg per 5 mL elixir  Swish and swallow 5 cc every 6 hours as needed Patient not taking: Reported on 01/20/2024 11/07/23   May, Deanna J, NP  Ascorbic Acid (VITAMIN C) 1000 MG tablet Take 1,000 mg by mouth daily. Takes 7,000mg     [provider]  Cholecalciferol  (VITAMIN  D) 50 MCG (2000 UT) CAPS Take by mouth. Takes 6,000 units    [provider]  ezetimibe  (ZETIA ) 10 MG tablet Take 1 tablet (10 mg total) by mouth daily. 03/18/22 02/27/24  Wilkinson, Dana E, FNP  FeFum-FePoly-FA-B Cmp-C-Biot (INTEGRA PLUS ) CAPS Take 1 capsule by mouth every morning. 08/02/23   Heilingoetter, Cassandra L, PA-C  fluticasone -salmeterol (ADVAIR DISKUS) 250-50 MCG/ACT AEPB Inhale 1 puff into the lungs in the morning and at bedtime. 03/18/22   Wilkinson, Dana E, FNP  ipratropium-albuterol  (DUONEB) 0.5-2.5 (3) MG/3ML SOLN Take 3 mLs by nebulization every 4 (four) hours as needed. Max:6 doses per day 05/11/23   Cranford, Tonya, NP  magnesium  oxide (MAG-OX) 400 (240 Mg) MG tablet Take 1 tablet (400 mg total) by mouth daily. Patient not taking: Reported on 01/20/2024 10/10/23   Heilingoetter, Cassandra L, PA-C  omeprazole  (PRILOSEC) 40 MG capsule Take 1 capsule (40 mg total) by  mouth 2 (two) times daily before a meal. 01/04/24   May, Deanna J, NP  ondansetron  (ZOFRAN ) 8 MG tablet Take 1 tablet (8 mg total) by mouth every 8 (eight) hours as needed for nausea or vomiting. Starting 3 days after chemotherapy 02/20/24   Marlene Simas, MD  prochlorperazine  (COMPAZINE ) 10 MG tablet Take 1 tablet (10 mg total) by mouth every 6 (six) hours as needed. 08/01/23   Heilingoetter, Cassandra L, PA-C  sucralfate  (CARAFATE ) 1 g tablet Take 1 tablet (1 g total) by mouth 4 (four) times daily. Dissolve each tablet in 15 cc water before use. 08/19/23   Johna Myers, MD    Family History Family History  Problem Relation Age of Onset   Colon polyps Mother    Inflammatory bowel disease Mother    Hypertension Mother    Stroke Mother    Heart disease Mother    Hyperlipidemia Mother    Varicose Veins Mother    Heart attack Mother    AAA (abdominal aortic aneurysm) Mother    Hypertension Father    Hyperlipidemia Father    Heart disease Father    Heart attack Father    Heart disease Brother    Hyperlipidemia  Brother    Hypertension Brother    Heart attack Brother    AAA (abdominal aortic aneurysm) Brother    Heart disease Maternal Grandmother    Diabetes Maternal Grandfather    Heart disease Maternal Grandfather    Varicose Veins Daughter    Colon cancer Other        mat great aunt   Esophageal cancer Neg Hx    Stomach cancer Neg Hx    Rectal cancer Neg Hx     Social History Social History   Tobacco Use   Smoking status: Former    Current packs/day: 0.00    Average packs/day: 0.3 packs/day for 36.0 years (9.0 ttl pk-yrs)    Types: Cigarettes    Start date: 09/14/1981    Quit date: 09/14/2017    Years since quitting: 6.4   Smokeless tobacco: Never   Tobacco comments:    1-2 sometimes not daily  Vaping Use   Vaping status: Never Used  Substance Use Topics   Alcohol use: Not Currently    Alcohol/week: 0.0 standard drinks of alcohol   Drug use: No     Allergies   Celebrex [celecoxib], Decadron  [dexamethasone ], Penicillins, Crestor [rosuvastatin], Doxycycline , Hydrocodone -acetaminophen , and Pravastatin   Review of Systems Review of Systems  Respiratory:  Positive for cough.   Neurological:  Positive for weakness.   See HPI  Physical Exam Triage Vital Signs ED Triage Vitals  Encounter Vitals Group     BP 03/09/24 1437 (!) 100/57     Systolic BP Percentile --      Diastolic BP Percentile --      Pulse Rate 03/09/24 1437 (!) 101     Resp 03/09/24 1437 (!) 22     Temp 03/09/24 1437 98.5 F (36.9 C)     Temp Source 03/09/24 1437 Oral     SpO2 03/09/24 1437 92 %     Weight --      Height --      Head Circumference --      Peak Flow --      Pain Score 03/09/24 1438 0     Pain Loc --      Pain Education --      Exclude from Growth Chart --    No data  found.  Updated Vital Signs BP (!) 100/57 (BP Location: Right Arm)   Pulse (!) 101   Temp 98.5 F (36.9 C) (Oral)   Resp (!) 22   SpO2 92%   Visual Acuity Right Eye Distance:   Left Eye Distance:    Bilateral Distance:    Right Eye Near:   Left Eye Near:    Bilateral Near:     Physical Exam Vitals reviewed.  Constitutional:      Appearance: She is cachectic. She is ill-appearing. She is not toxic-appearing.  HENT:     Mouth/Throat:     Pharynx: Oropharynx is clear.  Eyes:     Conjunctiva/sclera: Conjunctivae normal.  Cardiovascular:     Rate and Rhythm: Normal rate and regular rhythm.     Heart sounds: Normal heart sounds.  Pulmonary:     Effort: Pulmonary effort is normal.     Breath sounds: Examination of the right-lower field reveals decreased breath sounds. Examination of the left-lower field reveals decreased breath sounds. Decreased breath sounds present. No wheezing.  Skin:    General: Skin is warm and dry.  Neurological:     General: No focal deficit present.     Mental Status: She is alert. Mental status is at baseline.  Psychiatric:        Mood and Affect: Mood normal.      UC Treatments / Results  Labs (all labs ordered are listed, but only abnormal results are displayed) Labs Reviewed  POCT URINALYSIS DIP (MANUAL ENTRY) - Abnormal; Notable for the following components:      Result Value   Clarity, UA hazy (*)    All other components within normal limits    EKG   Radiology DG Chest 2 View Result Date: 03/09/2024 CLINICAL DATA:  weakness. EXAM: CHEST - 2 VIEW COMPARISON:  CT scan chest from 02/13/2024. FINDINGS: There is heterogeneous alveolar and interstitial opacity in the right upper lobe, highly concerning for atypical pneumonia versus pneumonitis. Findings are essentially unchanged since the prior study, considering the difference in technique. Bilateral lung fields are otherwise clear. Bilateral costophrenic angles are clear. Normal cardio-mediastinal silhouette. No acute osseous abnormalities. Multiple old healed left posterior rib fractures noted. The soft tissues are within normal limits. IMPRESSION: *Redemonstration of heterogeneous alveolar  and interstitial opacity in the right upper lobe, highly concerning for atypical pneumonia versus pneumonitis. Electronically Signed   By: Beula Brunswick M.D.   On: 03/09/2024 15:52    Procedures Procedures (including critical care time)  Medications Ordered in UC Medications  triamcinolone acetonide (KENALOG-40) injection 40 mg (40 mg Intramuscular Given 03/09/24 1638)    Initial Impression / Assessment and Plan / UC Course  I have reviewed the triage vital signs and the nursing notes.  Pertinent labs & imaging results that were available during my care of the patient were reviewed by me and considered in my medical decision making (see chart for details).     Weakness-patient x-ray revealed interstitial opacity in the right upper lobe highly concerning for atypical pneumonia versus pneumonitis.  Patient has multiple allergies and family concerned about treating with any of these.  Recently was on Levaquin  which did not help.  Decided to go ahead and treat with azithromycin  and Rocephin  injection given here in the clinic.  Also was giving Kenalog injection to help with lung inflammation.  Spoke with daughter about close watch over her over the next 24 to 48 hours and if symptoms worsen she will need to go  to the ER.  Family understanding and agreed. Vital signs stable upon discharge with oxygen  saturations around 95% on room air. Blood pressure 110/65 and heart rate normal.  Ambulatory upon discharge and in no acute distress Final Clinical Impressions(s) / UC Diagnoses   Final diagnoses:  Weakness  Community acquired pneumonia of right upper lobe of lung   Discharge Instructions      Treating you for a possible atypical pneumonia.  Take the antibiotics as prescribed.  Antibiotic injection given here along with steroid injection. If symptoms worsen you will need to go straight to the ER.  ED Prescriptions     Medication Sig Dispense Auth. Provider   azithromycin  (ZITHROMAX ) 250 MG  tablet Take 2 tablets (500 mg total) by mouth daily for 1 day, THEN 1 tablet (250 mg total) daily for 4 days. 6 tablet Cora Brierley A, FNP   nystatin  (MYCOSTATIN ) 100000 UNIT/ML suspension Take 5 mLs (500,000 Units total) by mouth 4 (four) times daily. 60 mL Jerri Morale A, FNP      PDMP not reviewed this encounter.   Landa Pine, FNP 03/10/24 [phone]

## 2024-03-11 ENCOUNTER — Emergency Department (HOSPITAL_COMMUNITY)

## 2024-03-11 ENCOUNTER — Inpatient Hospital Stay (HOSPITAL_COMMUNITY)
Admission: EM | Admit: 2024-03-11 | Discharge: 2024-03-15 | DRG: 193 | Disposition: A | Discharge status: Home Health | Attending: Internal Medicine | Admitting: Internal Medicine

## 2024-03-11 ENCOUNTER — Encounter (HOSPITAL_COMMUNITY): Payer: Self-pay | Admitting: Emergency Medicine

## 2024-03-11 ENCOUNTER — Other Ambulatory Visit: Payer: Self-pay

## 2024-03-11 DIAGNOSIS — I10 Essential (primary) hypertension: Secondary | ICD-10-CM | POA: Diagnosis present

## 2024-03-11 DIAGNOSIS — D61818 Other pancytopenia: Secondary | ICD-10-CM | POA: Diagnosis present

## 2024-03-11 DIAGNOSIS — J439 Emphysema, unspecified: Secondary | ICD-10-CM | POA: Diagnosis present

## 2024-03-11 DIAGNOSIS — R531 Weakness: Secondary | ICD-10-CM | POA: Diagnosis not present

## 2024-03-11 DIAGNOSIS — Z800 Family history of malignant neoplasm of digestive organs: Secondary | ICD-10-CM

## 2024-03-11 DIAGNOSIS — Z880 Allergy status to penicillin: Secondary | ICD-10-CM

## 2024-03-11 DIAGNOSIS — Z1152 Encounter for screening for COVID-19: Secondary | ICD-10-CM | POA: Diagnosis not present

## 2024-03-11 DIAGNOSIS — I739 Peripheral vascular disease, unspecified: Secondary | ICD-10-CM | POA: Diagnosis present

## 2024-03-11 DIAGNOSIS — R0609 Other forms of dyspnea: Secondary | ICD-10-CM

## 2024-03-11 DIAGNOSIS — D649 Anemia, unspecified: Secondary | ICD-10-CM | POA: Diagnosis not present

## 2024-03-11 DIAGNOSIS — R627 Adult failure to thrive: Principal | ICD-10-CM

## 2024-03-11 DIAGNOSIS — Z8673 Personal history of transient ischemic attack (TIA), and cerebral infarction without residual deficits: Secondary | ICD-10-CM | POA: Diagnosis not present

## 2024-03-11 DIAGNOSIS — Z681 Body mass index (BMI) 19 or less, adult: Secondary | ICD-10-CM | POA: Diagnosis not present

## 2024-03-11 DIAGNOSIS — Z90710 Acquired absence of both cervix and uterus: Secondary | ICD-10-CM

## 2024-03-11 DIAGNOSIS — Z7189 Other specified counseling: Secondary | ICD-10-CM | POA: Diagnosis not present

## 2024-03-11 DIAGNOSIS — Z881 Allergy status to other antibiotic agents status: Secondary | ICD-10-CM

## 2024-03-11 DIAGNOSIS — Z8701 Personal history of pneumonia (recurrent): Secondary | ICD-10-CM

## 2024-03-11 DIAGNOSIS — J984 Other disorders of lung: Secondary | ICD-10-CM | POA: Diagnosis not present

## 2024-03-11 DIAGNOSIS — E785 Hyperlipidemia, unspecified: Secondary | ICD-10-CM | POA: Diagnosis present

## 2024-03-11 DIAGNOSIS — D696 Thrombocytopenia, unspecified: Secondary | ICD-10-CM

## 2024-03-11 DIAGNOSIS — Z79899 Other long term (current) drug therapy: Secondary | ICD-10-CM

## 2024-03-11 DIAGNOSIS — Z923 Personal history of irradiation: Secondary | ICD-10-CM

## 2024-03-11 DIAGNOSIS — R64 Cachexia: Secondary | ICD-10-CM | POA: Diagnosis present

## 2024-03-11 DIAGNOSIS — Z9221 Personal history of antineoplastic chemotherapy: Secondary | ICD-10-CM

## 2024-03-11 DIAGNOSIS — I959 Hypotension, unspecified: Secondary | ICD-10-CM | POA: Diagnosis present

## 2024-03-11 DIAGNOSIS — Z96641 Presence of right artificial hip joint: Secondary | ICD-10-CM | POA: Diagnosis present

## 2024-03-11 DIAGNOSIS — R410 Disorientation, unspecified: Secondary | ICD-10-CM | POA: Diagnosis not present

## 2024-03-11 DIAGNOSIS — Z8711 Personal history of peptic ulcer disease: Secondary | ICD-10-CM

## 2024-03-11 DIAGNOSIS — Z85118 Personal history of other malignant neoplasm of bronchus and lung: Secondary | ICD-10-CM

## 2024-03-11 DIAGNOSIS — Z823 Family history of stroke: Secondary | ICD-10-CM

## 2024-03-11 DIAGNOSIS — Z860100 Personal history of colon polyps, unspecified: Secondary | ICD-10-CM

## 2024-03-11 DIAGNOSIS — J449 Chronic obstructive pulmonary disease, unspecified: Secondary | ICD-10-CM | POA: Diagnosis not present

## 2024-03-11 DIAGNOSIS — Z888 Allergy status to other drugs, medicaments and biological substances status: Secondary | ICD-10-CM

## 2024-03-11 DIAGNOSIS — Z8249 Family history of ischemic heart disease and other diseases of the circulatory system: Secondary | ICD-10-CM

## 2024-03-11 DIAGNOSIS — J189 Pneumonia, unspecified organism: Principal | ICD-10-CM | POA: Diagnosis present

## 2024-03-11 DIAGNOSIS — Z87891 Personal history of nicotine dependence: Secondary | ICD-10-CM | POA: Diagnosis not present

## 2024-03-11 DIAGNOSIS — Z515 Encounter for palliative care: Secondary | ICD-10-CM | POA: Diagnosis not present

## 2024-03-11 DIAGNOSIS — C3490 Malignant neoplasm of unspecified part of unspecified bronchus or lung: Secondary | ICD-10-CM | POA: Diagnosis not present

## 2024-03-11 DIAGNOSIS — E43 Unspecified severe protein-calorie malnutrition: Secondary | ICD-10-CM | POA: Diagnosis present

## 2024-03-11 DIAGNOSIS — J440 Chronic obstructive pulmonary disease with acute lower respiratory infection: Secondary | ICD-10-CM | POA: Diagnosis present

## 2024-03-11 DIAGNOSIS — H547 Unspecified visual loss: Secondary | ICD-10-CM | POA: Diagnosis present

## 2024-03-11 DIAGNOSIS — R0602 Shortness of breath: Secondary | ICD-10-CM | POA: Diagnosis present

## 2024-03-11 DIAGNOSIS — J90 Pleural effusion, not elsewhere classified: Secondary | ICD-10-CM | POA: Diagnosis not present

## 2024-03-11 DIAGNOSIS — J9601 Acute respiratory failure with hypoxia: Secondary | ICD-10-CM | POA: Diagnosis present

## 2024-03-11 DIAGNOSIS — M1990 Unspecified osteoarthritis, unspecified site: Secondary | ICD-10-CM | POA: Diagnosis present

## 2024-03-11 DIAGNOSIS — R600 Localized edema: Secondary | ICD-10-CM | POA: Diagnosis not present

## 2024-03-11 DIAGNOSIS — Z8349 Family history of other endocrine, nutritional and metabolic diseases: Secondary | ICD-10-CM

## 2024-03-11 DIAGNOSIS — R1310 Dysphagia, unspecified: Secondary | ICD-10-CM | POA: Diagnosis present

## 2024-03-11 DIAGNOSIS — Z833 Family history of diabetes mellitus: Secondary | ICD-10-CM

## 2024-03-11 DIAGNOSIS — R54 Age-related physical debility: Secondary | ICD-10-CM | POA: Diagnosis present

## 2024-03-11 DIAGNOSIS — Z7951 Long term (current) use of inhaled steroids: Secondary | ICD-10-CM

## 2024-03-11 DIAGNOSIS — Z83719 Family history of colon polyps, unspecified: Secondary | ICD-10-CM

## 2024-03-11 DIAGNOSIS — D72819 Decreased white blood cell count, unspecified: Secondary | ICD-10-CM | POA: Diagnosis not present

## 2024-03-11 DIAGNOSIS — M810 Age-related osteoporosis without current pathological fracture: Secondary | ICD-10-CM | POA: Diagnosis present

## 2024-03-11 DIAGNOSIS — R0600 Dyspnea, unspecified: Secondary | ICD-10-CM | POA: Diagnosis not present

## 2024-03-11 LAB — URINALYSIS, ROUTINE W REFLEX MICROSCOPIC
Bilirubin Urine: NEGATIVE
Glucose, UA: NEGATIVE mg/dL
Hgb urine dipstick: NEGATIVE
Ketones, ur: 5 mg/dL — AB
Leukocytes,Ua: NEGATIVE
Nitrite: NEGATIVE
Protein, ur: NEGATIVE mg/dL
Specific Gravity, Urine: 1.035 — ABNORMAL HIGH (ref 1.005–1.030)
pH: 5 (ref 5.0–8.0)

## 2024-03-11 LAB — CBC
HCT: 27.2 % — ABNORMAL LOW (ref 36.0–46.0)
Hemoglobin: 9.2 g/dL — ABNORMAL LOW (ref 12.0–15.0)
MCH: 37.9 pg — ABNORMAL HIGH (ref 26.0–34.0)
MCHC: 33.8 g/dL (ref 30.0–36.0)
MCV: 111.9 fL — ABNORMAL HIGH (ref 80.0–100.0)
Platelets: 50 10*3/uL — ABNORMAL LOW (ref 150–400)
RBC: 2.43 MIL/uL — ABNORMAL LOW (ref 3.87–5.11)
RDW: 19.9 % — ABNORMAL HIGH (ref 11.5–15.5)
WBC: 3.9 10*3/uL — ABNORMAL LOW (ref 4.0–10.5)
nRBC: 2.1 % — ABNORMAL HIGH (ref 0.0–0.2)

## 2024-03-11 LAB — BLOOD GAS, ARTERIAL
Acid-base deficit: 0.4 mmol/L (ref 0.0–2.0)
Bicarbonate: 24.1 mmol/L (ref 20.0–28.0)
Drawn by: 27021
O2 Saturation: 100 %
Patient temperature: 37
pCO2 arterial: 38 mmHg (ref 32–48)
pH, Arterial: 7.41 (ref 7.35–7.45)
pO2, Arterial: 76 mmHg — ABNORMAL LOW (ref 83–108)

## 2024-03-11 LAB — BRAIN NATRIURETIC PEPTIDE: B Natriuretic Peptide: 223.3 pg/mL — ABNORMAL HIGH (ref 0.0–100.0)

## 2024-03-11 LAB — COMPREHENSIVE METABOLIC PANEL WITH GFR
ALT: 9 U/L (ref 0–44)
AST: 14 U/L — ABNORMAL LOW (ref 15–41)
Albumin: 2.5 g/dL — ABNORMAL LOW (ref 3.5–5.0)
Alkaline Phosphatase: 154 U/L — ABNORMAL HIGH (ref 38–126)
Anion gap: 10 (ref 5–15)
BUN: 17 mg/dL (ref 8–23)
CO2: 23 mmol/L (ref 22–32)
Calcium: 8 mg/dL — ABNORMAL LOW (ref 8.9–10.3)
Chloride: 104 mmol/L (ref 98–111)
Creatinine, Ser: 0.61 mg/dL (ref 0.44–1.00)
GFR, Estimated: >60 mL/min (ref >60)
Glucose, Bld: 87 mg/dL (ref 70–99)
Potassium: 4.1 mmol/L (ref 3.5–5.1)
Sodium: 137 mmol/L (ref 135–145)
Total Bilirubin: 0.2 mg/dL (ref 0.0–1.2)
Total Protein: 5.4 g/dL — ABNORMAL LOW (ref 6.5–8.1)

## 2024-03-11 LAB — BLOOD GAS, VENOUS
Acid-base deficit: 1.1 mmol/L (ref 0.0–2.0)
Bicarbonate: 24.3 mmol/L (ref 20.0–28.0)
O2 Saturation: 80.1 %
Patient temperature: 37
pCO2, Ven: 43 mmHg — ABNORMAL LOW (ref 44–60)
pH, Ven: 7.36 (ref 7.25–7.43)
pO2, Ven: 46 mmHg — ABNORMAL HIGH (ref 32–45)

## 2024-03-11 LAB — MAGNESIUM: Magnesium: 3.3 mg/dL — ABNORMAL HIGH (ref 1.7–2.4)

## 2024-03-11 LAB — RESP PANEL BY RT-PCR (RSV, FLU A&B, COVID)  RVPGX2
Influenza A by PCR: NEGATIVE
Influenza B by PCR: NEGATIVE
Resp Syncytial Virus by PCR: NEGATIVE
SARS Coronavirus 2 by RT PCR: NEGATIVE

## 2024-03-11 LAB — LACTIC ACID, PLASMA
Lactic Acid, Venous: 0.8 mmol/L (ref 0.5–1.9)
Lactic Acid, Venous: 1.3 mmol/L (ref 0.5–1.9)

## 2024-03-11 LAB — CBG MONITORING, ED: Glucose-Capillary: 84 mg/dL (ref 70–99)

## 2024-03-11 LAB — TSH: TSH: 4.742 u[IU]/mL — ABNORMAL HIGH (ref 0.350–4.500)

## 2024-03-11 LAB — AMMONIA: Ammonia: 14 umol/L (ref 9–35)

## 2024-03-11 MED ORDER — IPRATROPIUM-ALBUTEROL 0.5-2.5 (3) MG/3ML IN SOLN
3.0000 mL | Freq: Once | RESPIRATORY_TRACT | Status: AC
  Administered 2024-03-11: 3 mL via RESPIRATORY_TRACT
  Filled 2024-03-11: qty 3

## 2024-03-11 MED ORDER — SODIUM CHLORIDE 0.9 % IV SOLN: Freq: Once | INTRAVENOUS | Status: AC

## 2024-03-11 MED ORDER — ACETAMINOPHEN 325 MG PO TABS: 650.0000 mg | ORAL_TABLET | Freq: Four times a day (QID) | ORAL | Status: DC | PRN

## 2024-03-11 MED ORDER — PANTOPRAZOLE SODIUM 40 MG IV SOLR
40.0000 mg | Freq: Once | INTRAVENOUS | Status: AC
  Administered 2024-03-11: 40 mg via INTRAVENOUS
  Filled 2024-03-11: qty 10

## 2024-03-11 MED ORDER — SODIUM CHLORIDE 0.9 % IV SOLN
2.0000 g | INTRAVENOUS | Status: AC
  Administered 2024-03-12 – 2024-03-15 (×4): 2 g via INTRAVENOUS
  Filled 2024-03-11 (×4): qty 20

## 2024-03-11 MED ORDER — ONDANSETRON HCL 4 MG PO TABS
4.0000 mg | ORAL_TABLET | Freq: Four times a day (QID) | ORAL | Status: DC | PRN
  Administered 2024-03-15: 4 mg via ORAL
  Filled 2024-03-11: qty 1

## 2024-03-11 MED ORDER — ALBUTEROL SULFATE (2.5 MG/3ML) 0.083% IN NEBU: 2.5000 mg | INHALATION_SOLUTION | Freq: Four times a day (QID) | RESPIRATORY_TRACT | Status: DC | PRN

## 2024-03-11 MED ORDER — ONDANSETRON HCL 4 MG/2ML IJ SOLN
4.0000 mg | Freq: Four times a day (QID) | INTRAMUSCULAR | Status: DC | PRN
Start: 2024-03-11 — End: 2024-03-15
  Administered 2024-03-12 – 2024-03-14 (×3): 4 mg via INTRAVENOUS
  Filled 2024-03-11 (×3): qty 2

## 2024-03-11 MED ORDER — PANTOPRAZOLE SODIUM 40 MG PO TBEC
80.0000 mg | DELAYED_RELEASE_TABLET | Freq: Two times a day (BID) | ORAL | Status: DC
  Administered 2024-03-12 – 2024-03-15 (×7): 80 mg via ORAL
  Filled 2024-03-11 (×7): qty 2

## 2024-03-11 MED ORDER — FUROSEMIDE 10 MG/ML IJ SOLN
20.0000 mg | Freq: Once | INTRAMUSCULAR | Status: AC
  Administered 2024-03-11: 20 mg via INTRAVENOUS
  Filled 2024-03-11: qty 4

## 2024-03-11 MED ORDER — ORAL CARE MOUTH RINSE
15.0000 mL | OROMUCOSAL | Status: DC
  Administered 2024-03-12 – 2024-03-15 (×9): 15 mL via OROMUCOSAL

## 2024-03-11 MED ORDER — FLUTICASONE FUROATE-VILANTEROL 200-25 MCG/ACT IN AEPB: 1.0000 | INHALATION_SPRAY | Freq: Every day | RESPIRATORY_TRACT | Status: DC

## 2024-03-11 MED ORDER — IOHEXOL 350 MG/ML SOLN
75.0000 mL | Freq: Once | INTRAVENOUS | Status: AC | PRN
  Administered 2024-03-11: 75 mL via INTRAVENOUS

## 2024-03-11 MED ORDER — SENNOSIDES-DOCUSATE SODIUM 8.6-50 MG PO TABS: 1.0000 | ORAL_TABLET | Freq: Every evening | ORAL | Status: DC | PRN

## 2024-03-11 MED ORDER — SUCRALFATE 1 G PO TABS: 1.0000 g | ORAL_TABLET | Freq: Four times a day (QID) | ORAL | Status: DC

## 2024-03-11 MED ORDER — SODIUM CHLORIDE 0.9 % IV SOLN
500.0000 mg | INTRAVENOUS | Status: DC
  Administered 2024-03-11 – 2024-03-14 (×4): 500 mg via INTRAVENOUS
  Filled 2024-03-11 (×5): qty 5

## 2024-03-11 MED ORDER — SODIUM CHLORIDE 0.9 % IV BOLUS
500.0000 mL | Freq: Once | INTRAVENOUS | Status: DC
Start: 2024-03-11 — End: 2024-03-11

## 2024-03-11 MED ORDER — ORAL CARE MOUTH RINSE: 15.0000 mL | OROMUCOSAL | Status: DC | PRN

## 2024-03-11 MED ORDER — EZETIMIBE 10 MG PO TABS: 10.0000 mg | ORAL_TABLET | Freq: Every day | ORAL | Status: DC

## 2024-03-11 MED ORDER — CHLORHEXIDINE GLUCONATE CLOTH 2 % EX PADS
6.0000 | MEDICATED_PAD | Freq: Every day | CUTANEOUS | Status: DC
  Administered 2024-03-12 – 2024-03-15 (×4): 6 via TOPICAL

## 2024-03-11 MED ORDER — MAGNESIUM SULFATE 2 GM/50ML IV SOLN
2.0000 g | Freq: Once | INTRAVENOUS | Status: AC
  Administered 2024-03-11: 2 g via INTRAVENOUS
  Filled 2024-03-11: qty 50

## 2024-03-11 MED ORDER — ACETAMINOPHEN 650 MG RE SUPP: 650.0000 mg | Freq: Four times a day (QID) | RECTAL | Status: DC | PRN

## 2024-03-11 MED ORDER — SODIUM CHLORIDE 0.9 % IV SOLN
2.0000 g | Freq: Once | INTRAVENOUS | Status: AC
  Administered 2024-03-11: 2 g via INTRAVENOUS
  Filled 2024-03-11: qty 12.5

## 2024-03-11 MED ORDER — METHYLPREDNISOLONE SODIUM SUCC 125 MG IJ SOLR
125.0000 mg | Freq: Once | INTRAMUSCULAR | Status: AC
  Administered 2024-03-11: 125 mg via INTRAVENOUS
  Filled 2024-03-11: qty 2

## 2024-03-11 NOTE — ED Notes (Signed)
 Pt. Placed on O2 @ 2 liters via [name]

## 2024-03-11 NOTE — ED Notes (Signed)
 Xray bedside.

## 2024-03-11 NOTE — ED Provider Notes (Signed)
 [place] EMERGENCY DEPARTMENT AT [facility] Provider Note   CSN: [id number] Arrival date & time: 03/11/24  1152     History Chief Complaint  Patient presents with   Shortness of Breath   Altered Mental Status    [name] is a 68 y.o. female with history of COPD, small cell lung cancer recently finished chemo and radiation in January 2025, iron  deficiency anemia, malnutrition, thrombocytopenia presents emerged from today for evaluation of cough and shortness of breath that has not been improved.  For the past 10 days/week and a half patient's been having an occasional productive cough and feeling short of breath.  She was given Levaquin  and a shot of Kenalog.  She did not have any relief with this and a week later was seen again at urgent care.  Patient was given azithromycin , Shot of Rocephin , and shot of Kenalog.  Still not having improvement of symptoms.  Patient reports that occasionally she will cough up something foamy but not blood.  Reports its mainly dry.  Has been using her DuoNebs at home however does not use her Advair per daughter.  Both deny history of congestive heart failure.  Patient has had problems with p.o. intake given her esophageal strictures status post radiation.  She has had multiple esophageal strictures and has been able to tolerate more for recently however still been limited.  Patient denies any chest pain.  She also reports whenever she started developing this cough and shortness of breath that she also noticed that she had some lower leg swelling to.  Reports that it was bilateral.  Has been staying the same since onset.  She denies taking any fluid pills.  Daughter at bedside also ports that she is been acting "off" for the past few days and that is why they ordered an urinalysis while at urgent care.  Patient is oriented x 4 in the emergency department and she is having appropriate conversations.  I am sure as family is unable to give me any  specific incident or episode to further clarify this.  Shortness of Breath Associated symptoms: cough   Associated symptoms: no abdominal pain, no chest pain, no fever, no sore throat and no vomiting   Altered Mental Status Associated symptoms: weakness (generalized)   Associated symptoms: no abdominal pain, no fever, no nausea and no vomiting        Home Medications Prior to Admission medications   Medication Sig Start Date End Date Taking? Authorizing Provider  albuterol  (PROVENTIL ) (2.5 MG/3ML) 0.083% nebulizer solution Take 3 mLs (2.5 mg total) by nebulization every 6 (six) hours as needed for wheezing or shortness of breath. 06/13/23  Yes Cranford, Tonya, NP  albuterol  (VENTOLIN  HFA) 108 (90 Base) MCG/ACT inhaler 2 Inhalations 15 to 20  minutes apart every 4 hours to Rescue Asthma Patient taking differently: Inhale 2 puffs into the lungs every 4 (four) hours as needed for shortness of breath or wheezing (for asthma symptoms- use 15-20 minutes apart). 05/11/23  Yes Cranford, Lynnie Saucier, NP  ARTIFICIAL TEARS PF 0.1-0.3 % SOLN Place 1 drop into both eyes 4 (four) times daily as needed (for dryness).   Yes [provider]  ascorbic acid (VITAMIN C) 500 MG tablet Take 500 mg by mouth daily.   Yes [provider]  azithromycin  (ZITHROMAX ) 250 MG tablet Take 2 tablets (500 mg total) by mouth daily for 1 day, THEN 1 tablet (250 mg total) daily for 4 days. 03/09/24 03/14/24 Yes Bast, Sophia Dustman  A, FNP  Cholecalciferol  (VITAMIN D3) 125 MCG (5000 UT) CAPS Take 5,000 Units by mouth daily.   Yes [provider]  ipratropium-albuterol  (DUONEB) 0.5-2.5 (3) MG/3ML SOLN Take 3 mLs by nebulization every 4 (four) hours as needed. Max:6 doses per day Patient taking differently: Take 3 mLs by nebulization every 4 (four) hours as needed (for bronchospasms; max of 6 doses/day). 05/11/23  Yes Cranford, Tonya, NP  nystatin  (MYCOSTATIN ) 100000 UNIT/ML suspension Take 5 mLs (500,000 Units total) by  mouth 4 (four) times daily. 03/09/24  Yes Bast, Traci A, FNP  omeprazole  (PRILOSEC) 40 MG capsule Take 1 capsule (40 mg total) by mouth 2 (two) times daily before a meal. 01/04/24  Yes May, Deanna J, NP  ondansetron  (ZOFRAN ) 8 MG tablet Take 1 tablet (8 mg total) by mouth every 8 (eight) hours as needed for nausea or vomiting. Starting 3 days after chemotherapy 02/20/24  Yes Marlene Simas, MD  AMBULATORY NON FORMULARY MEDICATION Medication Name: Magic mouthwash 1 part viscous lidocaine  2% 1 part Maalox 1 part Benadryl  12.5 mg per 5 mL elixir  Swish and swallow 5 cc every 6 hours as needed Patient not taking: Reported on 01/20/2024 11/07/23   May, Deanna J, NP  ezetimibe  (ZETIA ) 10 MG tablet Take 1 tablet (10 mg total) by mouth daily. Patient not taking: Reported on 03/11/2024 03/18/22 03/11/24  Wilkinson, Dana E, FNP  FeFum-FePoly-FA-B Cmp-C-Biot (INTEGRA PLUS ) CAPS Take 1 capsule by mouth every morning. Patient not taking: Reported on 03/11/2024 08/02/23   Heilingoetter, Cassandra L, PA-C  fluticasone -salmeterol (ADVAIR DISKUS) 250-50 MCG/ACT AEPB Inhale 1 puff into the lungs in the morning and at bedtime. Patient not taking: Reported on 03/11/2024 03/18/22   Wilkinson, Dana E, FNP  magnesium  oxide (MAG-OX) 400 (240 Mg) MG tablet Take 1 tablet (400 mg total) by mouth daily. Patient not taking: Reported on 01/20/2024 10/10/23   Heilingoetter, Cassandra L, PA-C  prochlorperazine  (COMPAZINE ) 10 MG tablet Take 1 tablet (10 mg total) by mouth every 6 (six) hours as needed. Patient not taking: Reported on 03/11/2024 08/01/23   Heilingoetter, Cassandra L, PA-C  sucralfate  (CARAFATE ) 1 g tablet Take 1 tablet (1 g total) by mouth 4 (four) times daily. Dissolve each tablet in 15 cc water before use. Patient not taking: Reported on 03/11/2024 08/19/23   Johna Myers, MD      Allergies    Tape, Celebrex [celecoxib], Decadron  [dexamethasone ], Penicillins, Crestor [rosuvastatin], Doxycycline , Hydrocodone -acetaminophen ,  and Pravastatin    Review of Systems   Review of Systems  Constitutional:  Positive for fatigue. Negative for chills and fever.  HENT:  Negative for congestion, rhinorrhea and sore throat.   Respiratory:  Positive for cough and shortness of breath.   Cardiovascular:  Positive for leg swelling. Negative for chest pain.  Gastrointestinal:  Negative for abdominal pain, diarrhea, nausea and vomiting.  Genitourinary:  Negative for dysuria.  Neurological:  Positive for weakness (generalized).    Physical Exam Updated Vital Signs BP (!) 96/57   Pulse 91   Temp 98.3 F (36.8 C) (Oral)   Resp 16   Ht 5\' 1"  (1.549 m)   Wt 36.7 kg   SpO2 93%   BMI 15.30 kg/m  Physical Exam Vitals and nursing note reviewed.  Constitutional:      Comments: Chronically ill-appearing, cachectic, but no acute distress  HENT:     Mouth/Throat:     Comments: Dry Cardiovascular:     Rate and Rhythm: Normal rate.  Pulmonary:  Effort: Pulmonary effort is normal.     Breath sounds: Wheezing and rales present.     Comments: Coarse rhonchi with prolonged expiratory phase heard throughout, question rales.  Patient speaking full sentences does not appear in any acute respiratory distress. Abdominal:     Palpations: Abdomen is soft.     Tenderness: There is no abdominal tenderness.  Musculoskeletal:     Right lower leg: Edema present.     Left lower leg: Edema present.     Comments: 1+ pitting edema in patient's lower extremities.  Skin:    General: Skin is warm and dry.  Neurological:     General: No focal deficit present.     Mental Status: She is alert and oriented to person, place, and time.     Cranial Nerves: No cranial nerve deficit, dysarthria or facial asymmetry.     Motor: No weakness.     ED Results / Procedures / Treatments   Labs (all labs ordered are listed, but only abnormal results are displayed) Labs Reviewed  COMPREHENSIVE METABOLIC PANEL WITH GFR - Abnormal; Notable for the  following components:      Result Value   Calcium 8.0 (*)    Total Protein 5.4 (*)    Albumin 2.5 (*)    AST 14 (*)    Alkaline Phosphatase 154 (*)    All other components within normal limits  CBC - Abnormal; Notable for the following components:   WBC 3.9 (*)    RBC 2.43 (*)    Hemoglobin 9.2 (*)    HCT 27.2 (*)    MCV 111.9 (*)    MCH 37.9 (*)    RDW 19.9 (*)    Platelets 50 (*)    nRBC 2.1 (*)    All other components within normal limits  URINALYSIS, ROUTINE W REFLEX MICROSCOPIC - Abnormal; Notable for the following components:   Specific Gravity, Urine 1.035 (*)    Ketones, ur 5 (*)    All other components within normal limits  BLOOD GAS, ARTERIAL - Abnormal; Notable for the following components:   pO2, Arterial 76 (*)    All other components within normal limits  BLOOD GAS, VENOUS - Abnormal; Notable for the following components:   pCO2, Ven 43 (*)    pO2, Ven 46 (*)    All other components within normal limits  TSH - Abnormal; Notable for the following components:   TSH 4.742 (*)    All other components within normal limits  BRAIN NATRIURETIC PEPTIDE - Abnormal; Notable for the following components:   B Natriuretic Peptide 223.3 (*)    All other components within normal limits  MAGNESIUM  - Abnormal; Notable for the following components:   Magnesium  3.3 (*)    All other components within normal limits  RESP PANEL BY RT-PCR (RSV, FLU A&B, COVID)  RVPGX2  CULTURE, BLOOD (ROUTINE X 2)  CULTURE, BLOOD (ROUTINE X 2)  MRSA NEXT GEN BY PCR, NASAL  LACTIC ACID, PLASMA  LACTIC ACID, PLASMA  AMMONIA  HIV ANTIBODY (ROUTINE TESTING W REFLEX)  MAGNESIUM   BASIC METABOLIC PANEL WITH GFR  CBC  STREP PNEUMONIAE URINARY ANTIGEN  LEGIONELLA PNEUMOPHILA SEROGP 1 UR AG  CBG MONITORING, ED    EKG None  Radiology CT Head Wo Contrast Result Date: 03/11/2024 CLINICAL DATA:  Altered level of consciousness, confusion, malaise EXAM: CT HEAD WITHOUT CONTRAST TECHNIQUE: Contiguous  axial images were obtained from the base of the skull through the vertex without intravenous contrast. RADIATION  DOSE REDUCTION: This exam was performed according to the departmental dose-optimization program which includes automated exposure control, adjustment of the mA and/or kV according to patient size and/or use of iterative reconstruction technique. COMPARISON:  09/16/2017 FINDINGS: Brain: No acute infarct or hemorrhage. The lateral ventricles and midline structures are unremarkable. No acute extra-axial fluid collections. No mass effect. Vascular: No hyperdense vessel or unexpected calcification. Skull: Postsurgical changes from prior left occipital craniotomy. No acute bony abnormalities. Sinuses/Orbits: Trace left mastoid effusion. Paranasal sinuses are clear. Other: None. IMPRESSION: 1. Stable head CT, no acute intracranial process. Electronically Signed   By: Bobbye Burrow M.D.   On: 03/11/2024 20:02   CT Angio Chest PE W and/or Wo Contrast Result Date: 03/11/2024 CLINICAL DATA:  Pulmonary embolism suspected, high probability. Pulmonary embolism versus pneumonia. Dyspnea with activity, confusion, and malaise. EXAM: CT ANGIOGRAPHY CHEST WITH CONTRAST TECHNIQUE: Multidetector CT imaging of the chest was performed using the standard protocol during bolus administration of intravenous contrast. Multiplanar CT image reconstructions and MIPs were obtained to evaluate the vascular anatomy. RADIATION DOSE REDUCTION: This exam was performed according to the departmental dose-optimization program which includes automated exposure control, adjustment of the mA and/or kV according to patient size and/or use of iterative reconstruction technique. CONTRAST:  75mL OMNIPAQUE  IOHEXOL  350 MG/ML SOLN COMPARISON:  02/13/2024, 11/14/2023. FINDINGS: Cardiovascular: The heart is normal in size and there is a trace pericardial effusion. Scattered coronary artery calcifications are noted. There is atherosclerotic  calcification of the aorta without evidence of aneurysm. The pulmonary trunk is normal in caliber. No evidence of pulmonary embolism is seen. Mediastinum/Nodes: No mediastinal lymphadenopathy is seen. There is a prominent soft tissue mass at the right hilum measuring 1.5 x 1.1 cm, increased in size from the prior exam. Soft tissue edema is noted in the mediastinum and unchanged from the prior exam. The thyroid  gland, trachea, and esophagus are within normal limits. Lungs/Pleura: There is a small right pleural effusion and trace left pleural effusion. Centrilobular emphysematous changes are noted in the lungs. Scattered ground-glass opacities are noted in the lungs and increased from the prior exam with a right upper lobe predominance. Compressive atelectasis is present in the right lower lobe. There is stable scattered pulmonary nodules measuring up to 4 mm in the right middle lobe. Upper Abdomen: No acute abnormality. Musculoskeletal: There are stable compression deformities in the superior endplate at L2 and T4. No acute osseous abnormality is seen. Review of the MIP images confirms the above findings. IMPRESSION: 1. No evidence of pulmonary embolism. 2. Scattered ground-glass opacities in the lungs bilaterally, increased from the prior exam, possible pneumonitis versus edema. 3. Stable scattered pulmonary nodules measuring up to 4 mm. 4. Soft tissue mass/lymph node at the right hilum measuring 1.5 x 1.1 cm, increased in size from most recent exam. 5. Small right pleural effusion trace left pleural effusion with associated atelectasis. 6. Coronary artery calcifications. 7. Aortic atherosclerosis. Electronically Signed   By: Wyvonnia Heimlich M.D.   On: 03/11/2024 15:52   DG Chest Portable 1 View Result Date: 03/11/2024 CLINICAL DATA:  Shortness of breath EXAM: PORTABLE CHEST 1 VIEW COMPARISON:  X-ray 03/09/2024 FINDINGS: Hyperinflation. Normal cardiopericardial silhouette. Calcified aorta. Tiny right pleural  effusion again seen. The hazy ill-defined right upper lung opacity is slightly increased today. Recommend follow-up. No pneumothorax or edema. Old left-sided rib fractures. IMPRESSION: Slight increase in right upper perihilar lung opacity. Tiny right effusion. Hyperinflation.  Recommend continued follow-up. Electronically Signed   By: Adrianna Horde  M.D.   On: 03/11/2024 13:23    Procedures Procedures   Medications Ordered in ED Medications  acetaminophen  (TYLENOL ) tablet 650 mg (has no administration in time range)    Or  acetaminophen  (TYLENOL ) suppository 650 mg (has no administration in time range)  ondansetron  (ZOFRAN ) tablet 4 mg (has no administration in time range)    Or  ondansetron  (ZOFRAN ) injection 4 mg (has no administration in time range)  senna-docusate (Senokot-S) tablet 1 tablet (has no administration in time range)  cefTRIAXone  (ROCEPHIN ) 2 g in sodium chloride  0.9 % 100 mL IVPB (has no administration in time range)  azithromycin  (ZITHROMAX ) 500 mg in sodium chloride  0.9 % 250 mL IVPB (has no administration in time range)  albuterol  (PROVENTIL ) (2.5 MG/3ML) 0.083% nebulizer solution 2.5 mg (has no administration in time range)  pantoprazole  (PROTONIX ) EC tablet 80 mg (has no administration in time range)  Chlorhexidine  Gluconate Cloth 2 % PADS 6 each (has no administration in time range)  ipratropium-albuterol  (DUONEB) 0.5-2.5 (3) MG/3ML nebulizer solution 3 mL (3 mLs Nebulization Given 03/11/24 1304)  iohexol  (OMNIPAQUE ) 350 MG/ML injection 75 mL (75 mLs Intravenous Contrast Given 03/11/24 1454)  magnesium  sulfate IVPB 2 g 50 mL (0 g Intravenous Stopped 03/11/24 1848)  ipratropium-albuterol  (DUONEB) 0.5-2.5 (3) MG/3ML nebulizer solution 3 mL (3 mLs Nebulization Given 03/11/24 1724)  methylPREDNISolone  sodium succinate (SOLU-MEDROL ) 125 mg/2 mL injection 125 mg (125 mg Intravenous Given 03/11/24 1825)  furosemide  (LASIX ) injection 20 mg (20 mg Intravenous Given 03/11/24 1825)  0.9  %  sodium chloride  infusion ( Intravenous New Bag/Given 03/11/24 1824)  pantoprazole  (PROTONIX ) injection 40 mg (40 mg Intravenous Given 03/11/24 1825)  ceFEPIme (MAXIPIME) 2 g in sodium chloride  0.9 % 100 mL IVPB (0 g Intravenous Stopped 03/11/24 1848)    ED Course/ Medical Decision Making/ A&P Clinical Course as of 03/11/24 2246  Sun Mar 11, 2024  1927 Formal ECHO, continue to monitor BP. If large output of urine from Lasix  and BP drops, can give fluid and CCM will revisit. Thinks the Lasix  may help if having right heart strain COPD. Lasix  0000 if patient isn't having good urine output. CCM will come to see the patient if BP hasn't improved or for other concerns, asks to be contacted.   [RR]    Clinical Course User Index [RR] Spence Dux, PA-C                               Medical Decision Making Amount and/or Complexity of Data Reviewed Labs: ordered. Radiology: ordered.  Risk Prescription drug management. Decision regarding hospitalization.   68 y.o. female presents to the ER for evaluation of cough and SOB, "off"?  Failure to thrive. Differential diagnosis includes but is not limited to CHF, pericardial effusion/tamponade, arrhythmias, ACS, COPD, asthma, bronchitis, pneumonia, pneumothorax, PE, anemia, UTI, electrolyte abnormality, malignancy. Vital signs blood pressure 94/53 otherwise unremarkable. Physical exam as noted above.   Patient with history of cancer as well as shortness of breath, will order CT angio to rule out PE versus focal pneumonia versus new mass.  I have ordered labs as well.  I independently reviewed and interpreted the patient's labs.  Pressure panel negative for COVID, flu, RSV.  CMP shows calcium at 8, total protein 5.4.  Albumin 2.5 with an AST of 14.  Alk phos at 154.  No other electrolyte or LFT abnormalities.  ABG shows a PaO2 of 76, within normal meds for patient's  reported history of COPD.  VBG shows PCO2 of 43.  Lactic acid within normal limits.  Blood  cultures pending.  Urinalysis cultured urine with 5 ketones otherwise no signs of infection.  Magnesium  at 3.3, BNP slightly elevated 223.3.  Ammonia pending.  CXR : Slight increase in right upper perihilar lung opacity. Tiny right effusion. Hyperinflation.  Recommend continued follow-up. Per radiologist's interpretation.    CT PE Angio 1. No evidence of pulmonary embolism. 2. Scattered ground-glass opacities in the lungs bilaterally, increased from the prior exam, possible pneumonitis versus edema. 3. Stable scattered pulmonary nodules measuring up to 4 mm. 4. Soft tissue mass/lymph node at the right hilum measuring 1.5 x 1.1 cm, increased in size from most recent exam. 5. Small right pleural effusion trace left pleural effusion with associated atelectasis. 6. Coronary artery calcifications. 7. Aortic atherosclerosis. Per radiologist's interpretation.    Consulting hematology/oncology who is aware the patient is here in the emergency room and will see them inpatient.  Spoke with Dr. Salomon Cree.  He is not concerned about the patient's worsening thrombocytopenia and thinks this may be in the setting of infection versus medications.  My attending assessed the patient at bedside and performed a bedside echo.  The patient has been a little more hypotensive with maps between 60 and 65.  He reports that her IVC is not compressible does not think that this is volume down situation.  Patient also does have some 1+ pitting edema to her lower extremities bilaterally.  He thinks that it may have some right heart strain and recommends giving her some Lasix  to see if this improves her heart function and blood pressure.  Her last echo was in 2018 patient does have a history of COPD.  He reports can give her infusion.  Patient has allergy listed for Decadron  as it causes GI bleeding.  My attending and sending these some adrenal insufficiency given her possible history of prednisone  use.  This also will help her with some of her  breathing.  Her lungs have some questionable coarse rhonchi versus Rales.  I have discussed with patient and family member about admission given that she has failed multiple modalities as well as her borderline soft blood pressures, decreased p.o. intake as well as CT imaging findings.  Family is amenable.  For the patient's transient altered mental status/confusion?  Unsure etiology.  Patient is alert and oriented x 4 with myself.  Family is unable to give me a situation in which she was displaying being altered.  Her urine is unremarkable.  Waiting for CT of her head to be completed.  TSH and ammonia pending.  I have discussed with Carlus Chihuahua, [facility]. The pharmacist recommends cefepime given that this is possibly from infectious pneumonitis versus edema versus post cancer treatment pathology.  Has asked that I add on a MRSA swab.  She has had no recent sputum pathologies of much support.  I called for admission per hospitalist discussed with Dr. Lydia Sams.  He would like me to consult critical care given her MAP are slightly below 65.  I have went into the room and reevaluated patient's blood pressure, which is placed over the patient switch her.  I have replaced this to the appropriate position and her MAP is 69.  Blood pressure obtained 20 minutes later also shows improved MAP above 65.  I consulted CCM and spoke with Dr. Charon Copper.  She reports that she would like to get a formal echo and continue to monitor the blood pressure  that the cuff is in the correct spot.  She agrees with the Lasix  dosing.  If the patient has a large output of urine from Lasix  and the blood pressure drops she reports that the patient can get fluid in CCM will revisit on reconsultation.  She thinks that the Lasix  may help with the patient's having some right heart strain from the COPD given that her last echo was in 2018.  If the patient is not having good urine output and blood pressure still remains with an parameters, does recommend  another redose of Lasix  around midnight.  CCM will come see the patient and blood pressure has not improved or for any other concerns but has asked me repaged.  I consulted Dr. Lydia Sams again and informed him on the plan given by Dr. Charon Copper.  He will admit the patient.  Portions of this report may have been transcribed using voice recognition software. Every effort was made to ensure accuracy; however, inadvertent computerized transcription errors may be present.    Final Clinical Impression(s) / ED Diagnoses Final diagnoses:  Failure to thrive in adult  Peripheral edema  Pneumonitis  Thrombocytopenia (HCC)  Confusion    Rx / DC Orders ED Discharge Orders     None         Spence Dux, PA-C 03/11/24 2246    Iva Mariner, MD 03/12/24 2318

## 2024-03-11 NOTE — ED Notes (Signed)
 ED TO INPATIENT HANDOFF REPORT  Name/Age/Gender [name] 68 y.o. female  Code Status    Code Status Orders  (From admission, onward)           Start     Ordered   03/11/24 2100  Full code  Continuous       Question:  By:  Answer:  Consent: discussion documented in EHR   03/11/24 2100           Code Status History     Date Active Date Inactive Code Status Order ID Comments User Context   09/16/2017 1641 09/23/2017 1834 Full Code [id number]  Evelyn Hire ED   08/18/2016 1904 08/20/2016 2132 Full Code [id number]  Wynetta Heckle, MD Inpatient   12/16/2014 1627 12/18/2014 1507 Full Code [id number]  Isaiah Marc, PA-C Inpatient       Home/SNF/Other Home  Chief Complaint Hypotension [I95.9]  Level of Care/Admitting Diagnosis ED Disposition     ED Disposition  Admit   Condition  --   Comment  Hospital Area: [facility] [[id number]]  Level of Care: Stepdown [14]  Admit to SDU based on following criteria: Hemodynamic compromise or significant risk of instability:  Patient requiring short term acute titration and management of vasoactive drips, and invasive monitoring (i.e., CVP and Arterial line).  May admit patient to [facility] or [facility] Sm[facility] if equivalent level of care is available:: No  Covid Evaluation: Confirmed COVID Negative  Diagnosis: Hypotension [401027]  Admitting Physician: Kenny Peals [[id number]]  Attending Physician: Kenny Peals [[id number]]  Certification:: I certify this patient will need inpatient services for at least 2 midnights  Expected Medical Readiness: 03/13/2024          Medical History Past Medical History:  Diagnosis Date   Allergy    Anemia    Arthritis    Asthma    Blood transfusion without reported diagnosis    Cataract    COPD (chronic obstructive pulmonary disease) (HCC)    Duodenal ulcer    Emphysema of lung (HCC)    Family history of malignant neoplasm of gastrointestinal tract     GERD (gastroesophageal reflux disease)    Hepatitis    had in 6th grade pt unsure which kind   Hiatal hernia    HTN (hypertension) 10/03/2014   Hx of colonic polyps    Hyperlipemia    Internal hemorrhoids    Macular degeneration    Osteoporosis    Peripheral vascular disease (HCC)    Pneumonia    hx of   Shortness of breath dyspnea    with ambulation   Small cell carcinoma metastatic to right lung (HCC)    Stroke (HCC)    Trigeminal neuralgia    Vitamin D  deficiency     Allergies Allergies  Allergen Reactions   Celebrex [Celecoxib] Other (See Comments)    GI bleed   Decadron  [Dexamethasone ] Other (See Comments)    Stomach issues, history of GI bleed   Penicillins Other (See Comments)    Has patient had a PCN reaction causing immediate rash, facial/tongue/throat swelling, SOB or lightheadedness with hypotension: Yes Has patient had a PCN reaction causing severe rash involving mucus membranes or skin necrosis: No Has patient had a PCN reaction that required hospitalization: No Has patient had a PCN reaction occurring within the last 10 years: Yes If all of the above answers are "NO", then may proceed with Cephalosporin use.   REACTION: whelps  Crestor [Rosuvastatin] Other (See Comments)    Made legs hurt   Doxycycline  Other (See Comments)    Stomach upset    Hydrocodone -Acetaminophen  Hives, Itching and Rash   Pravastatin Other (See Comments)    Muscle aches    IV Location/Drains/Wounds Patient Lines/Drains/Airways Status     Active Line/Drains/Airways     Name Placement date Placement time Site Days   Peripheral IV 03/11/24 22 G Left Antecubital 03/11/24  1230  Antecubital  less than 1            Labs/Imaging Results for orders placed or performed during the hospital encounter of 03/11/24 (from the past 48 hours)  CBG monitoring, ED     Status: None   Collection Time: 03/11/24 12:23 PM  Result Value Ref Range   Glucose-Capillary 84 70 - 99 mg/dL     Comment: Glucose reference range applies only to samples taken after fasting for at least 8 hours.  Resp panel by RT-PCR (RSV, Flu A&B, Covid) Anterior Nasal Swab     Status: None   Collection Time: 03/11/24 12:28 PM   Specimen: Anterior Nasal Swab  Result Value Ref Range   SARS Coronavirus 2 by RT PCR NEGATIVE NEGATIVE    Comment: (NOTE) SARS-CoV-2 target nucleic acids are NOT DETECTED.  The SARS-CoV-2 RNA is generally detectable in upper respiratory specimens during the acute phase of infection. The lowest concentration of SARS-CoV-2 viral copies this assay can detect is 138 copies/mL. A negative result does not preclude SARS-Cov-2 infection and should not be used as the sole basis for treatment or other patient management decisions. A negative result may occur with  improper specimen collection/handling, submission of specimen other than nasopharyngeal swab, presence of viral mutation(s) within the areas targeted by this assay, and inadequate number of viral copies(<138 copies/mL). A negative result must be combined with clinical observations, patient history, and epidemiological information. The expected result is Negative.  Fact Sheet for Patients:  BloggerCourse.com  Fact Sheet for Healthcare Providers:  SeriousBroker.it  This test is no t yet approved or cleared by the [place]  FDA and  has been authorized for detection and/or diagnosis of SARS-CoV-2 by FDA under an Emergency Use Authorization (EUA). This EUA will remain  in effect (meaning this test can be used) for the duration of the COVID-19 declaration under Section 564(b)(1) of the Act, 21 U.S.C.section 360bbb-3(b)(1), unless the authorization is terminated  or revoked sooner.       Influenza A by PCR NEGATIVE NEGATIVE   Influenza B by PCR NEGATIVE NEGATIVE    Comment: (NOTE) The Xpert Xpress SARS-CoV-2/FLU/RSV plus assay is intended as an aid in the  diagnosis of influenza from Nasopharyngeal swab specimens and should not be used as a sole basis for treatment. Nasal washings and aspirates are unacceptable for Xpert Xpress SARS-CoV-2/FLU/RSV testing.  Fact Sheet for Patients: BloggerCourse.com  Fact Sheet for Healthcare Providers: SeriousBroker.it  This test is not yet approved or cleared by the [place]  FDA and has been authorized for detection and/or diagnosis of SARS-CoV-2 by FDA under an Emergency Use Authorization (EUA). This EUA will remain in effect (meaning this test can be used) for the duration of the COVID-19 declaration under Section 564(b)(1) of the Act, 21 U.S.C. section 360bbb-3(b)(1), unless the authorization is terminated or revoked.     Resp Syncytial Virus by PCR NEGATIVE NEGATIVE    Comment: (NOTE) Fact Sheet for Patients: BloggerCourse.com  Fact Sheet for Healthcare Providers: SeriousBroker.it  This test is not yet  approved or cleared by the [place]  FDA and has been authorized for detection and/or diagnosis of SARS-CoV-2 by FDA under an Emergency Use Authorization (EUA). This EUA will remain in effect (meaning this test can be used) for the duration of the COVID-19 declaration under Section 564(b)(1) of the Act, 21 U.S.C. section 360bbb-3(b)(1), unless the authorization is terminated or revoked.  Performed at [facility], [address], [place], [place] [address]   Comprehensive metabolic panel     Status: Abnormal   Collection Time: 03/11/24 12:30 PM  Result Value Ref Range   Sodium 137 135 - 145 mmol/L   Potassium 4.1 3.5 - 5.1 mmol/L   Chloride 104 98 - 111 mmol/L   CO2 23 22 - 32 mmol/L   Glucose, Bld 87 70 - 99 mg/dL    Comment: Glucose reference range applies only to samples taken after fasting for at least 8 hours.   BUN 17 8 - 23 mg/dL   Creatinine, Ser 2.84  0.44 - 1.00 mg/dL   Calcium 8.0 (L) 8.9 - 10.3 mg/dL   Total Protein 5.4 (L) 6.5 - 8.1 g/dL   Albumin 2.5 (L) 3.5 - 5.0 g/dL   AST 14 (L) 15 - 41 U/L   ALT 9 0 - 44 U/L   Alkaline Phosphatase 154 (H) 38 - 126 U/L   Total Bilirubin 0.2 0.0 - 1.2 mg/dL   GFR, Estimated >13 >24 mL/min    Comment: (NOTE) Calculated using the CKD-EPI Creatinine Equation (2021)    Anion gap 10 5 - 15    Comment: Performed at [facility], [address], [place], [place] [address]  CBC     Status: Abnormal   Collection Time: 03/11/24 12:30 PM  Result Value Ref Range   WBC 3.9 (L) 4.0 - 10.5 K/uL   RBC 2.43 (L) 3.87 - 5.11 MIL/uL   Hemoglobin 9.2 (L) 12.0 - 15.0 g/dL   HCT 72.5 (L) 36.6 - 44.0 %   MCV 111.9 (H) 80.0 - 100.0 fL   MCH 37.9 (H) 26.0 - 34.0 pg   MCHC 33.8 30.0 - 36.0 g/dL   RDW 34.7 (H) 42.5 - 95.6 %   Platelets 50 (L) 150 - 400 K/uL    Comment: SPECIMEN CHECKED FOR CLOTS Immature Platelet Fraction may be clinically indicated, consider ordering this additional test [id number] REPEATED TO VERIFY PLATELET COUNT CONFIRMED BY SMEAR    nRBC 2.1 (H) 0.0 - 0.2 %    Comment: Performed at [facility], [address] [place], [place] [address]  Lactic acid, plasma     Status: None   Collection Time: 03/11/24 12:50 PM  Result Value Ref Range   Lactic Acid, Venous 0.8 0.5 - 1.9 mmol/L    Comment: Performed at [facility], [address], [place], [place] [address]  Blood gas, arterial (at [facility])     Status: Abnormal   Collection Time: 03/11/24  1:19 PM  Result Value Ref Range   O2 Content ROOM AIR L/min   pH, Arterial 7.41 7.35 - 7.45   pCO2 arterial 38 32 - 48 mmHg   pO2, Arterial 76 (L) 83 - 108 mmHg   Bicarbonate 24.1 20.0 - 28.0 mmol/L   Acid-base deficit 0.4 0.0 - 2.0 mmol/L   O2 Saturation 100 %   Patient temperature 37.0    Collection site RIGHT RADIAL    Drawn by [id number]     Comment: Performed at  [facility], [address], [place], [place] [address]  Blood gas, venous (at [facility])     Status: Abnormal   Collection Time: 03/11/24  1:51 PM  Result Value Ref Range   pH, Ven 7.36 7.25 - 7.43   pCO2, Ven 43 (L) 44 - 60 mmHg   pO2, Ven 46 (H) 32 - 45 mmHg   Bicarbonate 24.3 20.0 - 28.0 mmol/L   Acid-base deficit 1.1 0.0 - 2.0 mmol/L   O2 Saturation 80.1 %   Patient temperature 37.0     Comment: Performed at [facility], [address], [place], [place] [address]  Urinalysis, Routine w reflex microscopic -Urine, Clean Catch     Status: Abnormal   Collection Time: 03/11/24  3:32 PM  Result Value Ref Range   Color, Urine YELLOW YELLOW   APPearance CLEAR CLEAR   Specific Gravity, Urine 1.035 (H) 1.005 - 1.030   pH 5.0 5.0 - 8.0   Glucose, UA NEGATIVE NEGATIVE mg/dL   Hgb urine dipstick NEGATIVE NEGATIVE   Bilirubin Urine NEGATIVE NEGATIVE   Ketones, ur 5 (A) NEGATIVE mg/dL   Protein, ur NEGATIVE NEGATIVE mg/dL   Nitrite NEGATIVE NEGATIVE   Leukocytes,Ua NEGATIVE NEGATIVE    Comment: Performed at [facility], [address], [place], [place] [address]  TSH     Status: Abnormal   Collection Time: 03/11/24  6:19 PM  Result Value Ref Range   TSH 4.742 (H) 0.350 - 4.500 uIU/mL    Comment: Performed by a 3rd Generation assay with a functional sensitivity of <=0.01 uIU/mL. Performed at [facility], [address] [place], [place] [address]   Brain natriuretic peptide     Status: Abnormal   Collection Time: 03/11/24  6:19 PM  Result Value Ref Range   B Natriuretic Peptide 223.3 (H) 0.0 - 100.0 pg/mL    Comment: Performed at [facility], [address] [place], [place] [address]  Magnesium      Status: Abnormal   Collection Time: 03/11/24  6:19 PM  Result Value Ref Range   Magnesium  3.3 (H) 1.7 - 2.4 mg/dL    Comment: Performed at [facility], [address], [place], [place]  [address]   CT Head Wo Contrast Result Date: 03/11/2024 CLINICAL DATA:  Altered level of consciousness, confusion, malaise EXAM: CT HEAD WITHOUT CONTRAST TECHNIQUE: Contiguous axial images were obtained from the base of the skull through the vertex without intravenous contrast. RADIATION DOSE REDUCTION: This exam was performed according to the departmental dose-optimization program which includes automated exposure control, adjustment of the mA and/or kV according to patient size and/or use of iterative reconstruction technique. COMPARISON:  09/16/2017 FINDINGS: Brain: No acute infarct or hemorrhage. The lateral ventricles and midline structures are unremarkable. No acute extra-axial fluid collections. No mass effect. Vascular: No hyperdense vessel or unexpected calcification. Skull: Postsurgical changes from prior left occipital craniotomy. No acute bony abnormalities. Sinuses/Orbits: Trace left mastoid effusion. Paranasal sinuses are clear. Other: None. IMPRESSION: 1. Stable head CT, no acute intracranial process. Electronically Signed   By: Bobbye Burrow M.D.   On: 03/11/2024 20:02   CT Angio Chest PE W and/or Wo Contrast Result Date: 03/11/2024 CLINICAL DATA:  Pulmonary embolism suspected, high probability. Pulmonary embolism versus pneumonia. Dyspnea with activity, confusion, and malaise. EXAM: CT ANGIOGRAPHY CHEST WITH CONTRAST TECHNIQUE: Multidetector CT imaging of the chest was performed using the standard protocol during bolus administration of intravenous  contrast. Multiplanar CT image reconstructions and MIPs were obtained to evaluate the vascular anatomy. RADIATION DOSE REDUCTION: This exam was performed according to the departmental dose-optimization program which includes automated exposure control, adjustment of the mA and/or kV according to patient size and/or use of iterative reconstruction technique. CONTRAST:  75mL OMNIPAQUE  IOHEXOL  350 MG/ML SOLN COMPARISON:  02/13/2024, 11/14/2023. FINDINGS:  Cardiovascular: The heart is normal in size and there is a trace pericardial effusion. Scattered coronary artery calcifications are noted. There is atherosclerotic calcification of the aorta without evidence of aneurysm. The pulmonary trunk is normal in caliber. No evidence of pulmonary embolism is seen. Mediastinum/Nodes: No mediastinal lymphadenopathy is seen. There is a prominent soft tissue mass at the right hilum measuring 1.5 x 1.1 cm, increased in size from the prior exam. Soft tissue edema is noted in the mediastinum and unchanged from the prior exam. The thyroid  gland, trachea, and esophagus are within normal limits. Lungs/Pleura: There is a small right pleural effusion and trace left pleural effusion. Centrilobular emphysematous changes are noted in the lungs. Scattered ground-glass opacities are noted in the lungs and increased from the prior exam with a right upper lobe predominance. Compressive atelectasis is present in the right lower lobe. There is stable scattered pulmonary nodules measuring up to 4 mm in the right middle lobe. Upper Abdomen: No acute abnormality. Musculoskeletal: There are stable compression deformities in the superior endplate at L2 and T4. No acute osseous abnormality is seen. Review of the MIP images confirms the above findings. IMPRESSION: 1. No evidence of pulmonary embolism. 2. Scattered ground-glass opacities in the lungs bilaterally, increased from the prior exam, possible pneumonitis versus edema. 3. Stable scattered pulmonary nodules measuring up to 4 mm. 4. Soft tissue mass/lymph node at the right hilum measuring 1.5 x 1.1 cm, increased in size from most recent exam. 5. Small right pleural effusion trace left pleural effusion with associated atelectasis. 6. Coronary artery calcifications. 7. Aortic atherosclerosis. Electronically Signed   By: Wyvonnia Heimlich M.D.   On: 03/11/2024 15:52   DG Chest Portable 1 View Result Date: 03/11/2024 CLINICAL DATA:  Shortness of breath  EXAM: PORTABLE CHEST 1 VIEW COMPARISON:  X-ray 03/09/2024 FINDINGS: Hyperinflation. Normal cardiopericardial silhouette. Calcified aorta. Tiny right pleural effusion again seen. The hazy ill-defined right upper lung opacity is slightly increased today. Recommend follow-up. No pneumothorax or edema. Old left-sided rib fractures. IMPRESSION: Slight increase in right upper perihilar lung opacity. Tiny right effusion. Hyperinflation.  Recommend continued follow-up. Electronically Signed   By: Adrianna Horde M.D.   On: 03/11/2024 13:23    Pending Labs Unresulted Labs (From admission, onward)     Start     Ordered   03/12/24 0500  HIV Antibody (routine testing w rflx)  (HIV Antibody (Routine testing w reflex) panel)  Tomorrow morning,   R        03/11/24 2100   03/12/24 0500  Magnesium   Tomorrow morning,   R        03/11/24 2100   03/12/24 0500  Basic metabolic panel  Tomorrow morning,   R        03/11/24 2100   03/12/24 0500  CBC  Tomorrow morning,   R        03/11/24 2100   03/11/24 1732  MRSA Next Gen by PCR, Nasal  Once,   URGENT        03/11/24 1731   03/11/24 1700  Ammonia  Once,   STAT  03/11/24 1659   03/11/24 1220  Lactic acid, plasma  (Lactic Acid)  Now then every 2 hours,   R (with STAT occurrences)      03/11/24 1220   03/11/24 1220  Blood culture (routine x 2)  BLOOD CULTURE X 2,   R (with STAT occurrences)      03/11/24 1220            Vitals/Pain Today's Vitals   03/11/24 1800 03/11/24 1846 03/11/24 1900 03/11/24 1930  BP: (!) 86/45 (!) 95/51 (!) 99/56 (!) 96/57  Pulse: 88 90 84 91  Resp: 16   16  Temp:  98.3 F (36.8 C)    TempSrc:  Oral    SpO2: 93% 97% 97% 93%  Weight:      Height:      PainSc:        Isolation Precautions No active isolations  Medications Medications  acetaminophen  (TYLENOL ) tablet 650 mg (has no administration in time range)    Or  acetaminophen  (TYLENOL ) suppository 650 mg (has no administration in time range)  ondansetron   (ZOFRAN ) tablet 4 mg (has no administration in time range)    Or  ondansetron  (ZOFRAN ) injection 4 mg (has no administration in time range)  senna-docusate (Senokot-S) tablet 1 tablet (has no administration in time range)  ipratropium-albuterol  (DUONEB) 0.5-2.5 (3) MG/3ML nebulizer solution 3 mL (3 mLs Nebulization Given 03/11/24 1304)  iohexol  (OMNIPAQUE ) 350 MG/ML injection 75 mL (75 mLs Intravenous Contrast Given 03/11/24 1454)  magnesium  sulfate IVPB 2 g 50 mL (0 g Intravenous Stopped 03/11/24 1848)  ipratropium-albuterol  (DUONEB) 0.5-2.5 (3) MG/3ML nebulizer solution 3 mL (3 mLs Nebulization Given 03/11/24 1724)  methylPREDNISolone  sodium succinate (SOLU-MEDROL ) 125 mg/2 mL injection 125 mg (125 mg Intravenous Given 03/11/24 1825)  furosemide  (LASIX ) injection 20 mg (20 mg Intravenous Given 03/11/24 1825)  0.9 %  sodium chloride  infusion ( Intravenous New Bag/Given 03/11/24 1824)  pantoprazole  (PROTONIX ) injection 40 mg (40 mg Intravenous Given 03/11/24 1825)  ceFEPIme (MAXIPIME) 2 g in sodium chloride  0.9 % 100 mL IVPB (0 g Intravenous Stopped 03/11/24 1848)    Mobility walks with person assist

## 2024-03-11 NOTE — H&P (Addendum)
 History and Physical    [name]:[id number] DOB: 23-Dec-1955 DOA: 03/11/2024  PCP: Patient, No Pcp Per  Patient coming from: Home  I have personally briefly reviewed patient's old medical records in [facility]  Chief Complaint: Shortness of breath  HPI: [name] is a 68 y.o. female with medical history significant for small cell lung cancer of right lung s/p chemoradiation, COPD, HTN, HLD, pancytopenia, history of CVA, radiation-induced esophageal stricture s/p dilation who presented to the ED for evaluation of shortness of breath.  Patient states that over the last 2 weeks she has had progressive exertional dyspnea.  She has had cough occasionally productive of yellow/green sputum.  She has seen some swelling around her ankles.  She reports nausea but no emesis.  She has had some loose stools this morning.  She says that she has had increased urine output since receiving Lasix  while in the ED.  She says she was recently diagnosed with pneumonia which is being treated with an antibiotic.  She says her dyspnea significantly worsened today which is why she came to the hospital.  ED Course  Labs/Imaging on admission: I have personally reviewed following labs and imaging studies.  Initial vitals showed BP 112/51, pulse 96, RR 18, temp 98.3 F, SpO2 93% on room air.  While in the ED patient has been hypotensive with BP 80-90s/40s.  Labs show WBC 3.9, hemoglobin 9.2, platelets 50, sodium 137, potassium 4.1, bicarb 23, BUN 17, creatinine 0.61, serum glucose 87, lactic acid 0.8, BNP 223.3, magnesium  3.3, TSH 4.742.  Blood cultures in process.  UA negative for UTI.  COVID, influenza, RSV PCR negative.  CTA chest negative for PE.  Scattered groundglass opacity in the lungs bilaterally are increased from prior exam, possible pneumonitis versus edema.  Stable scattered pulmonary nodules measuring up to 4 mm noted.  Soft tissue mass/lymph node at the right hilum measuring 1.5  x 1.1 cm is increased in size from most recent exam.  Small right pleural effusion and trace left pleural effusion with atelectasis noted.  CT head without contrast negative for acute intracranial process.  Patient was given IV Solu-Medrol  125 mg, IV cefepime, IV magnesium  2 g, IV Lasix  20 mg, IV Protonix  40 mg, DuoNeb x 2.  EDP discussed with PCCM who recommended monitor UOP with IV Lasix .  If blood pressure drops give fluids back.  If no urine output consider redosing Lasix  tonight.  They are available if needed.  The hospitalist service was consulted to admit.  Review of Systems: All systems reviewed and are negative except as documented in history of present illness above.   Past Medical History:  Diagnosis Date   Allergy    Anemia    Arthritis    Asthma    Blood transfusion without reported diagnosis    Cataract    COPD (chronic obstructive pulmonary disease) (HCC)    Duodenal ulcer    Emphysema of lung (HCC)    Family history of malignant neoplasm of gastrointestinal tract    GERD (gastroesophageal reflux disease)    Hepatitis    had in 6th grade pt unsure which kind   Hiatal hernia    HTN (hypertension) 10/03/2014   Hx of colonic polyps    Hyperlipemia    Internal hemorrhoids    Macular degeneration    Osteoporosis    Peripheral vascular disease (HCC)    Pneumonia    hx of   Shortness of breath dyspnea    with  ambulation   Small cell carcinoma metastatic to right lung (HCC)    Stroke (HCC)    Trigeminal neuralgia    Vitamin D  deficiency     Past Surgical History:  Procedure Laterality Date   ABDOMINAL HYSTERECTOMY     APPENDECTOMY     BALLOON DILATION N/A 02/27/2024   Procedure: BALLOON DILATION;  Surgeon: Nannette Babe, MD;  Location: [facility]COPY;  Service: Gastroenterology;  Laterality: N/A;   BONE BIOPSY  02/14/2024   Procedure: BIOPSY;  Surgeon: Lajuan Pila, MD;  Location: [facility];  Service: Gastroenterology;;   CATARACT EXTRACTION, BILATERAL      COLONOSCOPY     CRANIOTOMY     for trigeminal neuralgia   ELBOW SURGERY Right    ENDARTERECTOMY Right 12/16/2014   Procedure: ENDARTERECTOMY CAROTID with Dacron patch;  Surgeon: Mayo Speck, MD;  Location: [facility] OR;  Service: Vascular;  Laterality: Right;   ESOPHAGOGASTRODUODENOSCOPY  10/2008   ESOPHAGOGASTRODUODENOSCOPY N/A 02/14/2024   Procedure: EGD (ESOPHAGOGASTRODUODENOSCOPY);  Surgeon: Lajuan Pila, MD;  Location: [facility];  Service: Gastroenterology;  Laterality: N/A;   ESOPHAGOGASTRODUODENOSCOPY N/A 02/27/2024   Procedure: EGD (ESOPHAGOGASTRODUODENOSCOPY);  Surgeon: Nannette Babe, MD;  Location: [facility];  Service: Gastroenterology;  Laterality: N/A;   FINE NEEDLE ASPIRATION BIOPSY  07/25/2023   Procedure: FINE NEEDLE ASPIRATION BIOPSY;  Surgeon: Denson Flake, MD;  Location: [facility]COPY;  Service: Pulmonary;;   HAND SURGERY Left    LUMBAR DISC SURGERY     SAVORY DILATION N/A 01/26/2024   Procedure: EGD, WITH DILATION USING SAVARY-GILLIARD DILATOR OVER GUIDEWIRE;  Surgeon: Elois Hair, MD;  Location: [facility] ENDOSCOPY;  Service: Gastroenterology;  Laterality: N/A;  Needs fluro   TOTAL HIP ARTHROPLASTY Right    trigeminal neuralgia surgery     UPPER GASTROINTESTINAL ENDOSCOPY     VIDEO BRONCHOSCOPY WITH ENDOBRONCHIAL ULTRASOUND N/A 07/25/2023   Procedure: VIDEO BRONCHOSCOPY WITH ENDOBRONCHIAL ULTRASOUND;  Surgeon: Denson Flake, MD;  Location: [facility] ENDOSCOPY;  Service: Pulmonary;  Laterality: N/A;   WRIST SURGERY Left     Social History: Social History   Tobacco Use   Smoking status: Former    Current packs/day: 0.00    Average packs/day: 0.3 packs/day for 36.0 years (9.0 ttl pk-yrs)    Types: Cigarettes    Start date: 09/14/1981    Quit date: 09/14/2017    Years since quitting: 6.4   Smokeless tobacco: Never   Tobacco comments:    1-2 sometimes not daily  Vaping Use   Vaping status: Never Used  Substance Use Topics   Alcohol use: Not Currently     Alcohol/week: 0.0 standard drinks of alcohol   Drug use: No   Allergies  Allergen Reactions   Celebrex [Celecoxib] Other (See Comments)    GI bleed   Decadron  [Dexamethasone ] Other (See Comments)    Stomach issues, history of GI bleed   Penicillins Other (See Comments)    Has patient had a PCN reaction causing immediate rash, facial/tongue/throat swelling, SOB or lightheadedness with hypotension: Yes Has patient had a PCN reaction causing severe rash involving mucus membranes or skin necrosis: No Has patient had a PCN reaction that required hospitalization: No Has patient had a PCN reaction occurring within the last 10 years: Yes If all of the above answers are "NO", then may proceed with Cephalosporin use.   REACTION: whelps   Crestor [Rosuvastatin] Other (See Comments)    Made legs hurt   Doxycycline  Other (See Comments)    Stomach upset  Hydrocodone -Acetaminophen  Hives, Itching and Rash   Pravastatin Other (See Comments)    Muscle aches    Family History  Problem Relation Age of Onset   Colon polyps Mother    Inflammatory bowel disease Mother    Hypertension Mother    Stroke Mother    Heart disease Mother    Hyperlipidemia Mother    Varicose Veins Mother    Heart attack Mother    AAA (abdominal aortic aneurysm) Mother    Hypertension Father    Hyperlipidemia Father    Heart disease Father    Heart attack Father    Heart disease Brother    Hyperlipidemia Brother    Hypertension Brother    Heart attack Brother    AAA (abdominal aortic aneurysm) Brother    Heart disease Maternal Grandmother    Diabetes Maternal Grandfather    Heart disease Maternal Grandfather    Varicose Veins Daughter    Colon cancer Other        mat great aunt   Esophageal cancer Neg Hx    Stomach cancer Neg Hx    Rectal cancer Neg Hx      Prior to Admission medications   Medication Sig Start Date End Date Taking? Authorizing Provider  albuterol  (PROVENTIL ) (2.5 MG/3ML) 0.083%  nebulizer solution Take 3 mLs (2.5 mg total) by nebulization every 6 (six) hours as needed for wheezing or shortness of breath. 06/13/23   Langley Pippin, NP  albuterol  (VENTOLIN  HFA) 108 (90 Base) MCG/ACT inhaler 2 Inhalations 15 to 20  minutes apart every 4 hours to Rescue Asthma 05/11/23   Langley Pippin, NP  AMBULATORY NON FORMULARY MEDICATION Medication Name: Magic mouthwash 1 part viscous lidocaine  2% 1 part Maalox 1 part Benadryl  12.5 mg per 5 mL elixir  Swish and swallow 5 cc every 6 hours as needed Patient not taking: Reported on 01/20/2024 11/07/23   May, Deanna J, NP  Ascorbic Acid (VITAMIN C) 1000 MG tablet Take 1,000 mg by mouth daily. Takes 7,000mg     [provider]  azithromycin  (ZITHROMAX ) 250 MG tablet Take 2 tablets (500 mg total) by mouth daily for 1 day, THEN 1 tablet (250 mg total) daily for 4 days. 03/09/24 03/14/24  Landa Pine, FNP  Cholecalciferol  (VITAMIN D ) 50 MCG (2000 UT) CAPS Take by mouth. Takes 6,000 units    [provider]  ezetimibe  (ZETIA ) 10 MG tablet Take 1 tablet (10 mg total) by mouth daily. 03/18/22 02/27/24  Wilkinson, Dana E, FNP  FeFum-FePoly-FA-B Cmp-C-Biot (INTEGRA PLUS ) CAPS Take 1 capsule by mouth every morning. 08/02/23   Heilingoetter, Cassandra L, PA-C  fluticasone -salmeterol (ADVAIR DISKUS) 250-50 MCG/ACT AEPB Inhale 1 puff into the lungs in the morning and at bedtime. 03/18/22   Wilkinson, Dana E, FNP  ipratropium-albuterol  (DUONEB) 0.5-2.5 (3) MG/3ML SOLN Take 3 mLs by nebulization every 4 (four) hours as needed. Max:6 doses per day 05/11/23   Cranford, Tonya, NP  magnesium  oxide (MAG-OX) 400 (240 Mg) MG tablet Take 1 tablet (400 mg total) by mouth daily. Patient not taking: Reported on 01/20/2024 10/10/23   Heilingoetter, Cassandra L, PA-C  nystatin  (MYCOSTATIN ) 100000 UNIT/ML suspension Take 5 mLs (500,000 Units total) by mouth 4 (four) times daily. 03/09/24   Landa Pine, FNP  omeprazole  (PRILOSEC) 40 MG capsule Take 1 capsule (40  mg total) by mouth 2 (two) times daily before a meal. 01/04/24   May, Deanna J, NP  ondansetron  (ZOFRAN ) 8 MG tablet Take 1 tablet (8 mg total) by  mouth every 8 (eight) hours as needed for nausea or vomiting. Starting 3 days after chemotherapy 02/20/24   Marlene Simas, MD  prochlorperazine  (COMPAZINE ) 10 MG tablet Take 1 tablet (10 mg total) by mouth every 6 (six) hours as needed. 08/01/23   Heilingoetter, Cassandra L, PA-C  sucralfate  (CARAFATE ) 1 g tablet Take 1 tablet (1 g total) by mouth 4 (four) times daily. Dissolve each tablet in 15 cc water before use. 08/19/23   Johna Myers, MD    Physical Exam: Vitals:   03/11/24 1800 03/11/24 1846 03/11/24 1900 03/11/24 1930  BP: (!) 86/45 (!) 95/51 (!) 99/56 (!) 96/57  Pulse: 88 90 84 91  Resp: 16   16  Temp:  98.3 F (36.8 C)    TempSrc:  Oral    SpO2: 93% 97% 97% 93%  Weight:      Height:       Constitutional: Thin woman resting in bed with head elevated.  NAD, calm, comfortable Eyes: EOMI, lids and conjunctivae normal ENMT: Mucous membranes are moist. Posterior pharynx clear of any exudate or lesions.Normal dentition.  Neck: normal, supple, no masses. Respiratory: Distant breath sounds, no wheezing. Normal respiratory effort. No accessory muscle use.  Cardiovascular: Regular rate and rhythm, no murmurs / rubs / gallops. No extremity edema. 2+ pedal pulses. Abdomen: no tenderness, no masses palpated. Musculoskeletal: no clubbing / cyanosis. No joint deformity upper and lower extremities. Good ROM, no contractures. Normal muscle tone.  Skin: no rashes, lesions, ulcers. No induration Neurologic: Sensation intact. Strength 5/5 in all 4.  Psychiatric: Normal judgment and insight. Alert and oriented x 3. Normal mood.   EKG: Ordered and pending.  Assessment/Plan Active Problems:   COPD (chronic obstructive pulmonary disease) (HCC)   Pancytopenia (HCC)   Hypotension   Dyspnea on exertion   [name] is a 68 y.o. female with  medical history significant for small cell lung cancer of right lung s/p chemoradiation, COPD, HTN, HLD, pancytopenia, history of CVA, radiation-induced esophageal stricture s/p dilation who is admitted with dyspnea and hypotension.  Assessment and Plan: Dyspnea on exertion: Progressive over the last 2 weeks.  Has been on antibiotics for reported pneumonia as an outpatient.  CTA chest negative for PE.  Scattered groundglass opacities bilaterally noted, could be pneumonitis versus edema although she does report cough productive of purulent sputum.  BNP mildly elevated.  Distant breath sounds otherwise no adventitious lung sounds on exam. - S/p IV Lasix  20 mg; patient reports good UOP however has not been charted - Obtain echocardiogram - Continue empiric ceftriaxone  and azithromycin  for now - Follow blood cultures - Close monitoring of blood pressure with diuresis versus need for IV fluid resuscitation  Hypotension: Hypotensive and low MAP in the ED.  BP stabilized to 90s/50s with MAP 65 or higher.  Received Lasix  as above.  BP probably runs on the low end given her small stature.  Monitor closely.  PCCM aware and available if necessary.  COPD: No active wheezing and does not seem to be in acute exacerbation at time of admission.  Continue albuterol  nebulizer as needed.  Pancytopenia: Chronic issue.  Hemoglobin and platelets slightly decreased from recent labs but no indication for transfusion.  Continue to monitor.  Anemia could be contributing to exertional dyspnea.   DVT prophylaxis: SCDs Start: 03/11/24 2100 Code Status: Full code, discussed with patient on admission Family Communication: Discussed with patient, she has discussed with family Disposition Plan: From home, dispo pending clinical progress Consults called: EDP  discussed with PCCM Severity of Illness: The appropriate patient status for this patient is INPATIENT. Inpatient status is judged to be reasonable and necessary in  order to provide the required intensity of service to ensure the patient's safety. The patient's presenting symptoms, physical exam findings, and initial radiographic and laboratory data in the context of their chronic comorbidities is felt to place them at high risk for further clinical deterioration. Furthermore, it is not anticipated that the patient will be medically stable for discharge from the hospital within 2 midnights of admission.   * I certify that at the point of admission it is my clinical judgment that the patient will require inpatient hospital care spanning beyond 2 midnights from the point of admission due to high intensity of service, high risk for further deterioration and high frequency of surveillance required.Edith Gores MD [facility]  If 7PM-7AM, please contact night-coverage [email or web address]  03/11/2024, 9:16 PM

## 2024-03-11 NOTE — ED Notes (Signed)
 Provided patient with ice chips as approved by provider

## 2024-03-11 NOTE — Hospital Course (Signed)
 [name] is a 68 y.o. female with medical history significant for small cell lung cancer of right lung s/p chemoradiation, COPD, HTN, HLD, pancytopenia, history of CVA, radiation-induced esophageal stricture s/p dilation who is admitted with dyspnea and hypotension.

## 2024-03-11 NOTE — Progress Notes (Signed)
 ED Pharmacy Antibiotic Sign Off An antibiotic consult was received from an ED provider for Cefepime per pharmacy dosing for HCAP. A chart review was completed to assess appropriateness.   The following one time order(s) were placed:  Cefepime 2g IV x 1  Further antibiotic and/or antibiotic pharmacy consults should be ordered by the admitting provider if indicated.   Thank you for allowing pharmacy to be a part of this patient's care.   Enis Harsh Nixon, [facility]  Clinical Pharmacist 03/11/24 5:34 PM

## 2024-03-11 NOTE — ED Triage Notes (Signed)
 Patient was diagnosed with Pneumonia about 1.5 weeks ago. She has been treated multiple times, but has not improved. Family reports her balance, dyspnea with activity, confusion and malaise was worsen.

## 2024-03-12 ENCOUNTER — Inpatient Hospital Stay (HOSPITAL_COMMUNITY)

## 2024-03-12 ENCOUNTER — Encounter: Payer: Self-pay | Admitting: Internal Medicine

## 2024-03-12 DIAGNOSIS — J984 Other disorders of lung: Secondary | ICD-10-CM

## 2024-03-12 DIAGNOSIS — D696 Thrombocytopenia, unspecified: Secondary | ICD-10-CM | POA: Diagnosis not present

## 2024-03-12 DIAGNOSIS — R627 Adult failure to thrive: Secondary | ICD-10-CM | POA: Diagnosis not present

## 2024-03-12 DIAGNOSIS — R0609 Other forms of dyspnea: Secondary | ICD-10-CM | POA: Diagnosis not present

## 2024-03-12 LAB — BASIC METABOLIC PANEL WITH GFR
Anion gap: 9 (ref 5–15)
BUN: 16 mg/dL (ref 8–23)
CO2: 23 mmol/L (ref 22–32)
Calcium: 7.2 mg/dL — ABNORMAL LOW (ref 8.9–10.3)
Chloride: 101 mmol/L (ref 98–111)
Creatinine, Ser: 0.55 mg/dL (ref 0.44–1.00)
GFR, Estimated: >60 mL/min (ref >60)
Glucose, Bld: 116 mg/dL — ABNORMAL HIGH (ref 70–99)
Potassium: 3.8 mmol/L (ref 3.5–5.1)
Sodium: 133 mmol/L — ABNORMAL LOW (ref 135–145)

## 2024-03-12 LAB — PROCALCITONIN: Procalcitonin: 0.37 ng/mL

## 2024-03-12 LAB — CBC
HCT: 23.1 % — ABNORMAL LOW (ref 36.0–46.0)
Hemoglobin: 7.7 g/dL — ABNORMAL LOW (ref 12.0–15.0)
MCH: 38.1 pg — ABNORMAL HIGH (ref 26.0–34.0)
MCHC: 33.3 g/dL (ref 30.0–36.0)
MCV: 114.4 fL — ABNORMAL HIGH (ref 80.0–100.0)
Platelets: 37 10*3/uL — ABNORMAL LOW (ref 150–400)
RBC: 2.02 MIL/uL — ABNORMAL LOW (ref 3.87–5.11)
RDW: 19.5 % — ABNORMAL HIGH (ref 11.5–15.5)
WBC: 1.3 10*3/uL — CL (ref 4.0–10.5)
nRBC: 3.1 % — ABNORMAL HIGH (ref 0.0–0.2)

## 2024-03-12 LAB — STREP PNEUMONIAE URINARY ANTIGEN: Strep Pneumo Urinary Antigen: NEGATIVE

## 2024-03-12 LAB — MRSA NEXT GEN BY PCR, NASAL: MRSA by PCR Next Gen: NOT DETECTED

## 2024-03-12 LAB — ECHOCARDIOGRAM COMPLETE
Area-P 1/2: 2.67 cm2
Est EF: 55
Height: 61 in
S' Lateral: 3 cm
Weight: 1467.38 [oz_av]

## 2024-03-12 LAB — HIV ANTIBODY (ROUTINE TESTING W REFLEX): HIV Screen 4th Generation wRfx: NONREACTIVE

## 2024-03-12 LAB — MAGNESIUM: Magnesium: 2.2 mg/dL (ref 1.7–2.4)

## 2024-03-12 MED ORDER — ACETAMINOPHEN 160 MG/5ML PO SOLN
ORAL | Status: AC
  Filled 2024-03-12: qty 20.3

## 2024-03-12 MED ORDER — SODIUM CHLORIDE 0.9 % IV SOLN
Freq: Once | INTRAVENOUS | Status: AC
Start: 2024-03-12 — End: 2024-03-12

## 2024-03-12 MED ORDER — ENSURE ENLIVE PO LIQD
237.0000 mL | Freq: Two times a day (BID) | ORAL | Status: DC
  Administered 2024-03-12 – 2024-03-15 (×6): 237 mL via ORAL
  Filled 2024-03-12 (×2): qty 237

## 2024-03-12 MED ORDER — ACETAMINOPHEN 160 MG/5ML PO SOLN
650.0000 mg | Freq: Four times a day (QID) | ORAL | Status: DC | PRN
  Administered 2024-03-12 – 2024-03-15 (×6): 650 mg via ORAL
  Filled 2024-03-12 (×5): qty 20.3

## 2024-03-12 MED ORDER — ACETAMINOPHEN 650 MG RE SUPP: 650.0000 mg | Freq: Four times a day (QID) | RECTAL | Status: DC | PRN

## 2024-03-12 MED ORDER — ENSURE MAX PROTEIN PO LIQD
11.0000 [oz_av] | Freq: Once | ORAL | Status: AC
  Administered 2024-03-12: 11 [oz_av] via ORAL
  Filled 2024-03-12: qty 330

## 2024-03-12 MED ORDER — LOPERAMIDE HCL 2 MG PO CAPS
2.0000 mg | ORAL_CAPSULE | Freq: Once | ORAL | Status: AC
  Administered 2024-03-12: 2 mg via ORAL
  Filled 2024-03-12: qty 1

## 2024-03-12 MED ORDER — MAGIC MOUTHWASH
10.0000 mL | Freq: Four times a day (QID) | ORAL | Status: DC | PRN
  Administered 2024-03-15: 10 mL via ORAL
  Filled 2024-03-12 (×2): qty 10

## 2024-03-12 NOTE — Progress Notes (Signed)
 Patient's daughter at bedside requesting an update on the patient's status (v/s, labs, imaging). Based on most recent CT scan, daughter inquired about palliative care to assist with goals of care as patient stated she did not want to do any radiation moving forward (if that were offered again). This nurse informed Dr. Alfonse Angle of patient/family request for this consult. See new order.

## 2024-03-12 NOTE — Progress Notes (Signed)
 Initial Nutrition Assessment  DOCUMENTATION CODES:   Underweight  INTERVENTION:  - Ensure Plus High Protein po BID, each supplement provides 350 kcal and 20 grams of protein. - Magic cup BID with meals, each supplement provides 290 kcal and 9 grams of protein - Encourage intake at all meals and of supplements. - Monitor weight trends.  NUTRITION DIAGNOSIS:   Underweight related to cancer and cancer related treatments as evidenced by other (comment) (BMI <18.5).  GOAL:   Patient will meet greater than or equal to 90% of their needs  MONITOR:   PO intake, Supplement acceptance, Weight trends  REASON FOR ASSESSMENT:   Consult Assessment of nutrition requirement/status, Poor PO  ASSESSMENT:   68 y.o. female with PMH significant for small cell lung cancer of right lung s/p chemoradiation, COPD, HTN, HLD, history of CVA, radiation-induced esophageal stricture s/p dilation who presented to the ED for evaluation of shortness of breath. Admitted for hypotension.   RD working remotely. Called patient via bedside telephone to obtain nutrition history.   Recent UBW reported to be 86#. Patient shares she has been able to gain some weight over the past few months.  Per EMR, summarized weight history over the past 1 year as below. 05/11/23 - 87# 08/29/23 - 87# 10/03/23 - 83# 11/21/23 - 78# 01/26/24 - 76# 5/5 - 87# 5/19 - 87#  Patient weighed at both 81# then 91# this admission. Suspect 81# may be more accurate as patient reports being sick and not eating well the past 1.5 weeks as well as patient noted to be admitted with some swelling around her ankles. At this time, suspect PCM due to very low BMI however unable to confirm without NFPE at this time.  Patient reports that over the past several months she has been eating good. She endorses getting esophageal dilations often but always eating foods just fine afterwards. Drinking Ensure 2-3x per week at home.   However, she reports that  over the past week and a half - she hasn't been eating well due to PNA. Notes her appetite has been decreased and she just hasn't felt good to eat welll.  No meal intakes documented since admission. She reports currently sipping an Ensure. She is agreeable to receive Ensure and Magic Cup to support intake. Encouraged patient to order and try and consume 3 meals a day in addition to supplements.   Of note, patient follows with outpatient cancer RD at the [facility].   Medications reviewed and include: -  Labs reviewed:  Na 133   NUTRITION - FOCUSED PHYSICAL EXAM:  RD working remotely  Diet Order:   Diet Order             Diet Heart Fluid consistency: Thin  Diet effective now                   EDUCATION NEEDS:  Education needs have been addressed  Skin:  Skin Assessment: Reviewed RN Assessment  Last BM:  5/26 - type 6  Height:  Ht Readings from Last 1 Encounters:  03/11/24 5\' 1"  (1.549 m)   Weight:  Wt Readings from Last 1 Encounters:  03/12/24 41.6 kg    BMI:  Body mass index is 17.33 kg/m.  Estimated Nutritional Needs:  Kcal:  1450-1650 kcals Protein:  65-80 grams Fluid:  >/= 1.5L    Scheryl Cushing RD, LDN Contact via Secure Chat.

## 2024-03-12 NOTE — Progress Notes (Signed)
 Patient consistently coughs/chokes with any thin liquid. Ordered swallow eval as patient states it has been a while since she has been evaluated.

## 2024-03-12 NOTE — Progress Notes (Addendum)
  Echocardiogram 2D Echocardiogram has been performed.  Fain Home RDCS 03/12/2024, 10:27 AM

## 2024-03-12 NOTE — Plan of Care (Signed)
  Problem: Clinical Measurements: Goal: Diagnostic test results will improve Outcome: Progressing Goal: Respiratory complications will improve Outcome: Progressing Goal: Cardiovascular complication will be avoided Outcome: Progressing   Problem: Activity: Goal: Risk for activity intolerance will decrease Outcome: Progressing   Problem: Nutrition: Goal: Adequate nutrition will be maintained Outcome: Progressing   Problem: Coping: Goal: Level of anxiety will decrease Outcome: Progressing   Problem: Elimination: Goal: Will not experience complications related to urinary retention Outcome: Progressing   Problem: Pain Managment: Goal: General experience of comfort will improve and/or be controlled Outcome: Progressing   Problem: Safety: Goal: Ability to remain free from injury will improve Outcome: Progressing   Problem: Skin Integrity: Goal: Risk for impaired skin integrity will decrease Outcome: Progressing

## 2024-03-12 NOTE — Progress Notes (Signed)
 [facility]  PROGRESS NOTE  TATIONNA FULLARD VWU:[id number] DOB: 1956/01/31 DOA: 03/11/2024 PCP: Patient, No Pcp Per   Brief HPI:    68 y.o. female with medical history significant for small cell lung cancer of right lung s/p chemoradiation, COPD, HTN, HLD, pancytopenia, history of CVA, radiation-induced esophageal stricture s/p dilation who presented to the ED for evaluation of shortness of breath.   CTA chest negative for PE. Scattered groundglass opacity in the lungs bilaterally are increased from prior exam, possible pneumonitis versus edema. Stable scattered pulmonary nodules measuring up to 4 mm noted. Soft tissue mass/lymph node at the right hilum measuring 1.5 x 1.1 cm is increased in size from most recent exam. Small right pleural effusion and trace left pleural effusion with atelectasis noted.    Assessment/Plan:   Acute hypoxemic respiratory failure - Secondary to unresolved pneumonia versus CHF - Patient recently treated for pneumonia as outpatient - CT chest negative for PE but showed pneumonitis versus edema - Started on IV Lasix  with good urine output - Continue empiric Rocephin  and Zithromax  - Check procalcitonin, follow blood cultures  Pancytopenia - Chronic - Getting chemotherapy as outpatient -Follow cbc in am   COPD No active wheezing and does not seem to be in acute exacerbation at time of admission.   Continue albuterol  nebulizer as needed.    Medications     Chlorhexidine  Gluconate Cloth  6 each Topical Daily   mouth rinse  15 mL Mouth Rinse 4 times per day   pantoprazole   80 mg Oral BID AC     Data Reviewed:   CBG:  Recent Labs  Lab 03/11/24 1223  GLUCAP 84    SpO2: 100 % O2 Flow Rate (L/min): (S) 4 L/min    Vitals:   03/12/24 0339 03/12/24 0400 03/12/24 0500 03/12/24 0715  BP:  (!) 100/54 (!) 109/56   Pulse:  71 67   Resp:  16 10   Temp: 98.2 F (36.8 C)   98.2 F (36.8 C)  TempSrc: Oral   Oral  SpO2:  96% 100%   Weight:    41.6 kg   Height:          Data Reviewed:  Basic Metabolic Panel: Recent Labs  Lab 03/11/24 1230 03/11/24 1819 03/12/24 0542  NA 137  --  133*  K 4.1  --  3.8  CL 104  --  101  CO2 23  --  23  GLUCOSE 87  --  116*  BUN 17  --  16  CREATININE 0.61  --  0.55  CALCIUM 8.0*  --  7.2*  MG  --  3.3* 2.2    CBC: Recent Labs  Lab 03/11/24 1230 03/12/24 0542  WBC 3.9* 1.3*  HGB 9.2* 7.7*  HCT 27.2* 23.1*  MCV 111.9* 114.4*  PLT 50* 37*    LFT Recent Labs  Lab 03/11/24 1230  AST 14*  ALT 9  ALKPHOS 154*  BILITOT 0.2  PROT 5.4*  ALBUMIN 2.5*     Antibiotics: Anti-infectives (From admission, onward)    Start     Dose/Rate Route Frequency Ordered Stop   03/12/24 0200  cefTRIAXone  (ROCEPHIN ) 2 g in sodium chloride  0.9 % 100 mL IVPB        2 g 200 mL/hr over 30 Minutes Intravenous Every 24 hours 03/11/24 2104     03/11/24 2115  azithromycin  (ZITHROMAX ) 500 mg in sodium chloride  0.9 % 250 mL IVPB        500  mg 250 mL/hr over 60 Minutes Intravenous Every 24 hours 03/11/24 2104     03/11/24 1745  ceFEPIme (MAXIPIME) 2 g in sodium chloride  0.9 % 100 mL IVPB        2 g 200 mL/hr over 30 Minutes Intravenous  Once 03/11/24 1734 03/11/24 1848        DVT prophylaxis:   Code Status: Full code  Family Communication:    CONSULTS    Subjective   Breathing is improved   Objective    Physical Examination:   General-appears in no acute distress Heart-S1-S2, regular, no murmur auscultated Lungs-decreased breath sounds at lung bases Abdomen-soft, nontender, no organomegaly Extremities-no edema in the lower extremities Neuro-alert, oriented x3, no focal deficit noted  Status is: Inpatient:         Ozell Blunt   [facility] If 7PM-7AM, please contact night-coverage at [email or web address], Office  [phone]   03/12/2024, 8:19 AM  LOS: 1 day

## 2024-03-12 NOTE — Evaluation (Signed)
 Clinical/Bedside Swallow Evaluation Patient Details  Name: [name] MRN: [id number] Date of Birth: 27-Jun-1956  Today's Date: 03/12/2024 Time: SLP Start Time (ACUTE ONLY): 1345 SLP Stop Time (ACUTE ONLY): 1402 SLP Time Calculation (min) (ACUTE ONLY): 17 min  Past Medical History:  Past Medical History:  Diagnosis Date   Allergy    Anemia    Arthritis    Asthma    Blood transfusion without reported diagnosis    Cataract    COPD (chronic obstructive pulmonary disease) (HCC)    Duodenal ulcer    Emphysema of lung (HCC)    Family history of malignant neoplasm of gastrointestinal tract    GERD (gastroesophageal reflux disease)    Hepatitis    had in 6th grade pt unsure which kind   Hiatal hernia    HTN (hypertension) 10/03/2014   Hx of colonic polyps    Hyperlipemia    Internal hemorrhoids    Macular degeneration    Osteoporosis    Peripheral vascular disease (HCC)    Pneumonia    hx of   Shortness of breath dyspnea    with ambulation   Small cell carcinoma metastatic to right lung (HCC)    Stroke (HCC)    Trigeminal neuralgia    Vitamin D  deficiency    Past Surgical History:  Past Surgical History:  Procedure Laterality Date   ABDOMINAL HYSTERECTOMY     APPENDECTOMY     BALLOON DILATION N/A 02/27/2024   Procedure: BALLOON DILATION;  Surgeon: Nannette Babe, MD;  Location: [facility];  Service: Gastroenterology;  Laterality: N/A;   BONE BIOPSY  02/14/2024   Procedure: BIOPSY;  Surgeon: Lajuan Pila, MD;  Location: [facility];  Service: Gastroenterology;;   CATARACT EXTRACTION, BILATERAL     COLONOSCOPY     CRANIOTOMY     for trigeminal neuralgia   ELBOW SURGERY Right    ENDARTERECTOMY Right 12/16/2014   Procedure: ENDARTERECTOMY CAROTID with Dacron patch;  Surgeon: Mayo Speck, MD;  Location: [facility] OR;  Service: Vascular;  Laterality: Right;   ESOPHAGOGASTRODUODENOSCOPY  10/2008   ESOPHAGOGASTRODUODENOSCOPY N/A 02/14/2024   Procedure: EGD  (ESOPHAGOGASTRODUODENOSCOPY);  Surgeon: Lajuan Pila, MD;  Location: [facility];  Service: Gastroenterology;  Laterality: N/A;   ESOPHAGOGASTRODUODENOSCOPY N/A 02/27/2024   Procedure: EGD (ESOPHAGOGASTRODUODENOSCOPY);  Surgeon: Nannette Babe, MD;  Location: [facility];  Service: Gastroenterology;  Laterality: N/A;   FINE NEEDLE ASPIRATION BIOPSY  07/25/2023   Procedure: FINE NEEDLE ASPIRATION BIOPSY;  Surgeon: Denson Flake, MD;  Location: [facility];  Service: Pulmonary;;   HAND SURGERY Left    LUMBAR DISC SURGERY     SAVORY DILATION N/A 01/26/2024   Procedure: EGD, WITH DILATION USING SAVARY-GILLIARD DILATOR OVER GUIDEWIRE;  Surgeon: Elois Hair, MD;  Location: [facility] ENDOSCOPY;  Service: Gastroenterology;  Laterality: N/A;  Needs fluro   TOTAL HIP ARTHROPLASTY Right    trigeminal neuralgia surgery     UPPER GASTROINTESTINAL ENDOSCOPY     VIDEO BRONCHOSCOPY WITH ENDOBRONCHIAL ULTRASOUND N/A 07/25/2023   Procedure: VIDEO BRONCHOSCOPY WITH ENDOBRONCHIAL ULTRASOUND;  Surgeon: Denson Flake, MD;  Location: [facility]OS[facility];  Service: Pulmonary;  Laterality: N/A;   WRIST SURGERY Left    HPI:  Patient is a 68 y.o. female with PMH: small cell lung cancer of right lung s/p chemoradiation, COPD, HTN, HLD, pancytopenia, CVA, radiation-induced esophageal stricture s/p dilation (02/14/24). She presented to the ED for evaluation of SOB on 03/11/24. CT head negative for acute intracranial process. CT angio/chest/PE showed scattered ground  glass opacities in lungs bilaterally, soft tissue mass/lymphnode right hilum increased in size since most recent exam, stable pulmonary nodules. SLP swallow evaluation ordered secondary to RN observing patient to cough with any thin liquid and patient reporting it had been a while since she was evaluated for swallowing.    Assessment / Plan / Recommendation  Clinical Impression  Patient is not currently presenting with clinical s/s of dysphagia as per this bedside  swallow evaluation. She reports that her swallowing has improved since dilation completed on 02/14/24 but she isn't able to eat many solid foods and does not tolerate swallowing pills that are too large. She consumes mainly liquid nutrition. SLP assessed her swallowing at bedside with thin liquids and pudding solids. Swallow initiation appeared timely and no overt s/s aspiration observed. SLP suspects that patient's dysphagia is primarily esophageal in nature. Although not recommending further skilled intervention at this time, if patient continues with concerns for dysphagia, would recommend proceeding with MBS (modified barium swallow study). SLP Visit Diagnosis: Dysphagia, unspecified (R13.10)    Aspiration Risk  Mild aspiration risk;No limitations    Diet Recommendation Regular;Thin liquid    Liquid Administration via: Cup;Straw Medication Administration: Other (Comment) (as tolerated) Supervision: Patient able to self feed Compensations: Slow rate;Small sips/bites Postural Changes: Seated upright at 90 degrees    Other  Recommendations Oral Care Recommendations: Oral care BID    Recommendations for follow up therapy are one component of a multi-disciplinary discharge planning process, led by the attending physician.  Recommendations may be updated based on patient status, additional functional criteria and insurance authorization.  Follow up Recommendations No SLP follow up      Assistance Recommended at Discharge    Functional Status Assessment Patient has not had a recent decline in their functional status  Frequency and Duration   N/A         Prognosis   N/A     Swallow Study   General Date of Onset: 03/12/24 HPI: Patient is a 68 y.o. female with PMH: small cell lung cancer of right lung s/p chemoradiation, COPD, HTN, HLD, pancytopenia, CVA, radiation-induced esophageal stricture s/p dilation (02/14/24). She presented to the ED for evaluation of SOB on 03/11/24. CT head  negative for acute intracranial process. CT angio/chest/PE showed scattered ground glass opacities in lungs bilaterally, soft tissue mass/lymphnode right hilum increased in size since most recent exam, stable pulmonary nodules. SLP swallow evaluation ordered secondary to RN observing patient to cough with any thin liquid and patient reporting it had been a while since she was evaluated for swallowing. Type of Study: Bedside Swallow Evaluation Previous Swallow Assessment: none found Diet Prior to this Study: Regular;Thin liquids (Level 0) Temperature Spikes Noted: No Respiratory Status: Nasal cannula History of Recent Intubation: No Behavior/Cognition: Alert;Cooperative;Pleasant mood Oral Cavity Assessment: Within Functional Limits Oral Care Completed by SLP: No Oral Cavity - Dentition: Missing dentition Vision: Functional for self-feeding Self-Feeding Abilities: Able to feed self Patient Positioning: Upright in bed Baseline Vocal Quality: Normal Volitional Cough: Strong Volitional Swallow: Able to elicit    Oral/Motor/Sensory Function Overall Oral Motor/Sensory Function: Within functional limits   Ice Chips     Thin Liquid Thin Liquid: Within functional limits Presentation: Straw;Self Fed    Nectar Thick     Honey Thick     Puree Puree: Within functional limits Presentation: Self Fed;Spoon   Solid     Solid: Not tested     Jacqualine Mater, MA, CCC-SLP Speech Therapy

## 2024-03-13 ENCOUNTER — Ambulatory Visit (HOSPITAL_BASED_OUTPATIENT_CLINIC_OR_DEPARTMENT_OTHER): Admitting: Family Medicine

## 2024-03-13 DIAGNOSIS — R627 Adult failure to thrive: Secondary | ICD-10-CM | POA: Diagnosis not present

## 2024-03-13 DIAGNOSIS — Z7189 Other specified counseling: Secondary | ICD-10-CM | POA: Diagnosis not present

## 2024-03-13 DIAGNOSIS — R531 Weakness: Secondary | ICD-10-CM | POA: Diagnosis not present

## 2024-03-13 DIAGNOSIS — D696 Thrombocytopenia, unspecified: Secondary | ICD-10-CM | POA: Diagnosis not present

## 2024-03-13 DIAGNOSIS — Z515 Encounter for palliative care: Secondary | ICD-10-CM

## 2024-03-13 DIAGNOSIS — J984 Other disorders of lung: Secondary | ICD-10-CM | POA: Diagnosis not present

## 2024-03-13 LAB — CBC WITH DIFFERENTIAL/PLATELET
Abs Immature Granulocytes: 0.06 10*3/uL (ref 0.00–0.07)
Basophils Absolute: 0 10*3/uL (ref 0.0–0.1)
Basophils Relative: 0 %
Eosinophils Absolute: 0 10*3/uL (ref 0.0–0.5)
Eosinophils Relative: 0 %
HCT: 23.7 % — ABNORMAL LOW (ref 36.0–46.0)
Hemoglobin: 7.6 g/dL — ABNORMAL LOW (ref 12.0–15.0)
Immature Granulocytes: 2 %
Lymphocytes Relative: 19 %
Lymphs Abs: 0.5 10*3/uL — ABNORMAL LOW (ref 0.7–4.0)
MCH: 37.3 pg — ABNORMAL HIGH (ref 26.0–34.0)
MCHC: 32.1 g/dL (ref 30.0–36.0)
MCV: 116.2 fL — ABNORMAL HIGH (ref 80.0–100.0)
Monocytes Absolute: 0.3 10*3/uL (ref 0.1–1.0)
Monocytes Relative: 10 %
Neutro Abs: 1.9 10*3/uL (ref 1.7–7.7)
Neutrophils Relative %: 69 %
Platelets: 39 10*3/uL — ABNORMAL LOW (ref 150–400)
RBC: 2.04 MIL/uL — ABNORMAL LOW (ref 3.87–5.11)
RDW: 18.6 % — ABNORMAL HIGH (ref 11.5–15.5)
WBC: 2.8 10*3/uL — ABNORMAL LOW (ref 4.0–10.5)
nRBC: 2.1 % — ABNORMAL HIGH (ref 0.0–0.2)

## 2024-03-13 NOTE — Progress Notes (Signed)
 Brief Oncology Note:  Medical oncology/Dr. Marguerita Shih is aware of patient's admission.  Dr. Marguerita Shih will round on patient tomorrow 03/14/24.  Please send message if any oncologic concerns or questions in the interim.

## 2024-03-13 NOTE — Consult Note (Signed)
 Consultation Note Date: 03/13/2024   Patient Name: [name]  DOB: 1956/06/17  MRN: [id number]  Age / Sex: 68 y.o., female  PCP: Patient, No Pcp Per Referring Physician: Ozell Blunt, MD  Reason for Consultation: Establishing goals of care  HPI/Patient Profile: 68 y.o. female  admitted on 03/11/2024   Clinical Assessment and Goals of Care: 68 year old lady with small cell lung cancer of right lung status post chemoradiation, underlying COPD hypertension dyslipidemia pancytopenia history of stroke history of radiation-induced esophageal stricture status post dilation Patient admitted with shortness of breath Upon CT scan patient found to have scattered groundglass opacity in the lungs bilaterally increased from prior exam, stable scattered pulmonary nodules, also with soft tissue mass/lymph node at the right hilum which is increased in size Patient admitted to hospital medicine service in stepdown unit for acute hypoxic respiratory failure Patient given IV Lasix  Patient given empiric antibiotics Palliative consult for CODE STATUS and broad goals of care discussions has been requested. Chart reviewed, patient seen and examined Discussed with her daughter at bedside Goals wishes and values attempted to be explored.  Patient states that she is only 68 years old and wants to continue living however she states that she is fully aware of the serious nature of her condition.  Daughter states that she is very much concerned about the patient's current condition and that she might not get back to what was a baseline for her.  We discussed about CODE STATUS in detail, we compared and contrasted full code versus DNR/DNI in detail.  Additionally, we introduced hospice philosophy of care and how that might be of benefit to the patient at this point.  See below.  Thank you for the consult.  HCPOA Daughter Cathleen Coach is  healthcare power of attorney  SUMMARY OF RECOMMENDATIONS   Goals of care discussions and CODE STATUS discussions: Full code for now, extensive discussions initially with the patient and then subsequently arrived back at bedside when her daughter was available for further discussions.  At this point, patient and daughter considering DNR-limited however would like to discuss further amongst themselves and are also awaiting further input from medical oncology.  Palliative care to continue to follow. Continue current mode of care for now Monitor oral intake, functional status, PT participation over the course of the next 24 to 48 hours. Recommend skilled nursing facility rehab with palliative services towards the end of this hospitalization if appropriate.  If the patient does not have a stabilization/meaningful recovery in the next 2 to 3 days, will reengage with patient and daughter with regards to choosing more of a comfort-focused approach that will include addition of hospice.  Brief overview of hospice services provided to daughter Cathleen Coach today at the time of initial consultation. Thank you for the consult, palliative services to continue to follow along.  Code Status/Advance Care Planning: Full code   Symptom Management:     Palliative Prophylaxis:  Delirium Protocol  Additional Recommendations (Limitations, Scope, Preferences): Full Scope Treatment  Psycho-social/Spiritual:  Desire for  further Chaplaincy support:yes Additional Recommendations: Caregiving  Support/Resources  Prognosis:  Unable to determine  Discharge Planning: To Be Determined      Primary Diagnoses: Present on Admission:  Hypotension  COPD (chronic obstructive pulmonary disease) (HCC)   I have reviewed the medical record, interviewed the patient and family, and examined the patient. The following aspects are pertinent.  Past Medical History:  Diagnosis Date   Allergy    Anemia    Arthritis     Asthma    Blood transfusion without reported diagnosis    Cataract    COPD (chronic obstructive pulmonary disease) (HCC)    Duodenal ulcer    Emphysema of lung (HCC)    Family history of malignant neoplasm of gastrointestinal tract    GERD (gastroesophageal reflux disease)    Hepatitis    had in 6th grade pt unsure which kind   Hiatal hernia    HTN (hypertension) 10/03/2014   Hx of colonic polyps    Hyperlipemia    Internal hemorrhoids    Macular degeneration    Osteoporosis    Peripheral vascular disease (HCC)    Pneumonia    hx of   Shortness of breath dyspnea    with ambulation   Small cell carcinoma metastatic to right lung (HCC)    Stroke (HCC)    Trigeminal neuralgia    Vitamin D  deficiency    Social History   Socioeconomic History   Marital status: Widowed    Spouse name: Not on file   Number of children: 1   Years of education: Not on file   Highest education level: Not on file  Occupational History   Occupation: disability  Tobacco Use   Smoking status: Former    Current packs/day: 0.00    Average packs/day: 0.3 packs/day for 36.0 years (9.0 ttl pk-yrs)    Types: Cigarettes    Start date: 09/14/1981    Quit date: 09/14/2017    Years since quitting: 6.4   Smokeless tobacco: Never   Tobacco comments:    1-2 sometimes not daily  Vaping Use   Vaping status: Never Used  Substance and Sexual Activity   Alcohol use: Not Currently    Alcohol/week: 0.0 standard drinks of alcohol   Drug use: No   Sexual activity: Not Currently  Other Topics Concern   Not on file  Social History Narrative   Not on file   Social Drivers of Health   Financial Resource Strain: Unknown (09/18/2017)   Overall Financial Resource Strain (CARDIA)    Difficulty of Paying Living Expenses: Patient declined  Food Insecurity: No Food Insecurity (03/11/2024)   Hunger Vital Sign    Worried About Running Out of Food in the Last Year: Never true    Ran Out of Food in the Last Year:  Never true  Transportation Needs: No Transportation Needs (03/11/2024)   PRAPARE - Administrator, Civil Service (Medical): No    Lack of Transportation (Non-Medical): No  Physical Activity: Unknown (09/18/2017)   Exercise Vital Sign    Days of Exercise per Week: Patient declined    Minutes of Exercise per Session: Patient declined  Stress: Stress Concern Present (09/18/2017)   [employer] of Occupational Health - Occupational Stress Questionnaire    Feeling of Stress : To some extent  Social Connections: Unknown (03/11/2024)   Social Connection and Isolation Panel [NHANES]    Frequency of Communication with Friends and Family: More than three times a week  Frequency of Social Gatherings with Friends and Family: Twice a week    Attends Religious Services: Patient declined    Active Member of Clubs or Organizations: No    Attends Banker Meetings: Patient unable to answer    Marital Status: Widowed   Family History  Problem Relation Age of Onset   Colon polyps Mother    Inflammatory bowel disease Mother    Hypertension Mother    Stroke Mother    Heart disease Mother    Hyperlipidemia Mother    Varicose Veins Mother    Heart attack Mother    AAA (abdominal aortic aneurysm) Mother    Hypertension Father    Hyperlipidemia Father    Heart disease Father    Heart attack Father    Heart disease Brother    Hyperlipidemia Brother    Hypertension Brother    Heart attack Brother    AAA (abdominal aortic aneurysm) Brother    Heart disease Maternal Grandmother    Diabetes Maternal Grandfather    Heart disease Maternal Grandfather    Varicose Veins Daughter    Colon cancer Other        mat great aunt   Esophageal cancer Neg Hx    Stomach cancer Neg Hx    Rectal cancer Neg Hx    Scheduled Meds:  Chlorhexidine  Gluconate Cloth  6 each Topical Daily   feeding supplement  237 mL Oral BID BM   mouth rinse  15 mL Mouth Rinse 4 times per day   pantoprazole    80 mg Oral BID AC   Continuous Infusions:  azithromycin  Stopped (03/12/24 2157)   cefTRIAXone  (ROCEPHIN )  IV Stopped (03/13/24 0243)   PRN Meds:.acetaminophen  (TYLENOL ) oral liquid 160 mg/5 mL **OR** acetaminophen , albuterol , magic mouthwash, ondansetron  **OR** ondansetron  (ZOFRAN ) IV, mouth rinse, senna-docusate Medications Prior to Admission:  Prior to Admission medications   Medication Sig Start Date End Date Taking? Authorizing Provider  albuterol  (PROVENTIL ) (2.5 MG/3ML) 0.083% nebulizer solution Take 3 mLs (2.5 mg total) by nebulization every 6 (six) hours as needed for wheezing or shortness of breath. 06/13/23  Yes Cranford, Lynnie Saucier, NP  albuterol  (VENTOLIN  HFA) 108 (90 Base) MCG/ACT inhaler 2 Inhalations 15 to 20  minutes apart every 4 hours to Rescue Asthma Patient taking differently: Inhale 2 puffs into the lungs every 4 (four) hours as needed for shortness of breath or wheezing (for asthma symptoms- use 15-20 minutes apart). 05/11/23  Yes Cranford, Lynnie Saucier, NP  ARTIFICIAL TEARS PF 0.1-0.3 % SOLN Place 1 drop into both eyes 4 (four) times daily as needed (for dryness).   Yes [provider]  ascorbic acid (VITAMIN C) 500 MG tablet Take 500 mg by mouth daily.   Yes [provider]  azithromycin  (ZITHROMAX ) 250 MG tablet Take 2 tablets (500 mg total) by mouth daily for 1 day, THEN 1 tablet (250 mg total) daily for 4 days. 03/09/24 03/14/24 Yes Bast, Traci A, FNP  Cholecalciferol  (VITAMIN D3) 125 MCG (5000 UT) CAPS Take 5,000 Units by mouth daily.   Yes [provider]  ipratropium-albuterol  (DUONEB) 0.5-2.5 (3) MG/3ML SOLN Take 3 mLs by nebulization every 4 (four) hours as needed. Max:6 doses per day Patient taking differently: Take 3 mLs by nebulization every 4 (four) hours as needed (for bronchospasms; max of 6 doses/day). 05/11/23  Yes Cranford, Tonya, NP  nystatin  (MYCOSTATIN ) 100000 UNIT/ML suspension Take 5 mLs (500,000 Units total) by mouth 4 (four) times daily.  03/09/24  Yes Landa Pine, FNP  omeprazole  (PRILOSEC) 40 MG capsule Take 1 capsule (40 mg total) by mouth 2 (two) times daily before a meal. 01/04/24  Yes May, Deanna J, NP  ondansetron  (ZOFRAN ) 8 MG tablet Take 1 tablet (8 mg total) by mouth every 8 (eight) hours as needed for nausea or vomiting. Starting 3 days after chemotherapy 02/20/24  Yes Marlene Simas, MD  AMBULATORY NON FORMULARY MEDICATION Medication Name: Magic mouthwash 1 part viscous lidocaine  2% 1 part Maalox 1 part Benadryl  12.5 mg per 5 mL elixir  Swish and swallow 5 cc every 6 hours as needed Patient not taking: Reported on 01/20/2024 11/07/23   May, Deanna J, NP  ezetimibe  (ZETIA ) 10 MG tablet Take 1 tablet (10 mg total) by mouth daily. Patient not taking: Reported on 03/11/2024 03/18/22 03/11/24  Wilkinson, Dana E, FNP  FeFum-FePoly-FA-B Cmp-C-Biot (INTEGRA PLUS ) CAPS Take 1 capsule by mouth every morning. Patient not taking: Reported on 03/11/2024 08/02/23   Heilingoetter, Cassandra L, PA-C  fluticasone -salmeterol (ADVAIR DISKUS) 250-50 MCG/ACT AEPB Inhale 1 puff into the lungs in the morning and at bedtime. Patient not taking: Reported on 03/11/2024 03/18/22   Wilkinson, Dana E, FNP  magnesium  oxide (MAG-OX) 400 (240 Mg) MG tablet Take 1 tablet (400 mg total) by mouth daily. Patient not taking: Reported on 01/20/2024 10/10/23   Heilingoetter, Cassandra L, PA-C  prochlorperazine  (COMPAZINE ) 10 MG tablet Take 1 tablet (10 mg total) by mouth every 6 (six) hours as needed. Patient not taking: Reported on 03/11/2024 08/01/23   Heilingoetter, Cassandra L, PA-C  sucralfate  (CARAFATE ) 1 g tablet Take 1 tablet (1 g total) by mouth 4 (four) times daily. Dissolve each tablet in 15 cc water before use. Patient not taking: Reported on 03/11/2024 08/19/23   Johna Myers, MD   Allergies  Allergen Reactions   Tape Other (See Comments)    SKIN IS VERY SENSITIVE!!!   Celebrex [Celecoxib] Other (See Comments)    GI bleed   Decadron  [Dexamethasone ]  Other (See Comments)    Stomach issues, history of GI bleed   Penicillins Other (See Comments)    WELTS Has patient had a PCN reaction causing immediate rash, facial/tongue/throat swelling, SOB or lightheadedness with hypotension: Yes Has patient had a PCN reaction causing severe rash involving mucus membranes or skin necrosis: No Has patient had a PCN reaction that required hospitalization: No Has patient had a PCN reaction occurring within the last 10 years: Yes If all of the above answers are "NO", then may proceed with Cephalosporin use.   REACTION: whelps   Crestor [Rosuvastatin] Other (See Comments)    Made legs hurt   Doxycycline  Other (See Comments)    Stomach upset    Hydrocodone -Acetaminophen  Hives, Itching and Rash   Pravastatin Other (See Comments)    Muscle aches   Review of Systems Weakness Cough  Physical Exam Weak and frail appearing lady resting in bed in On supplemental oxygen  via nasal cannula Has coarse breath sounds S1-S2  Vital Signs: BP 101/65 (BP Location: Right Arm)   Pulse 77   Temp 97.9 F (36.6 C) (Oral)   Resp 15   Ht 5\' 1"  (1.549 m)   Wt 41.6 kg   SpO2 95%   BMI 17.33 kg/m  Pain Scale: 0-10   Pain Score: Asleep   SpO2: SpO2: 95 % O2 Device:SpO2: 95 % O2 Flow Rate: .O2 Flow Rate (L/min): 2 L/min  IO: Intake/output summary:  Intake/Output Summary (Last 24 hours) at 03/13/2024 1428 Last data filed at 03/13/2024 1234 Gross  per 24 hour  Intake 709.01 ml  Output 800 ml  Net -90.99 ml    LBM: Last BM Date : 03/12/24 Baseline Weight: Weight: 36.7 kg Most recent weight: Weight: 41.6 kg     Palliative Assessment/Data:   Palliative performance scale 50%  Time In: 1520 Time Out: 1620 Time Total: 60 Greater than 50%  of this time was spent counseling and coordinating care related to the above assessment and plan.  Signed by: Lujean Sake, MD   Please contact Palliative Medicine Team phone at [phone] for questions and concerns.   For individual provider: See Tilford Foley

## 2024-03-13 NOTE — TOC Initial Note (Signed)
 Transition of Care [facility]) - Initial/Assessment Note    Patient Details  Name: [name] MRN: [id number] Date of Birth: 1956/09/25  [facility]) CM/SW Contact:    Tessie Fila, RN Phone Number: 03/13/2024, 2:34 PM  Clinical Narrative:                 Pt is from home. Pt daughter Baelynn Schmuhl [phone] is pt point of contact. There are no identified SDOH needs. No DME or HH needs at this time. Pt has active consult for HF Navigation team and Palliative Care. TOC will follow for any new recommendations or needs.  Expected Discharge Plan: Home/Self Care Barriers to Discharge: Continued Medical Work up   Patient Goals and CMS Choice Patient states their goals for this hospitalization and ongoing recovery are:: To return home CMS Medicare.gov Compare Post Acute Care list provided to:: Other (Comment Required) (NA) Choice offered to / list presented to : NA [place] ownership interest in [facility].provided to:: Parent NA    Expected Discharge Plan and Services In-house Referral: NA Discharge Planning Services: CM Consult Post Acute Care Choice: NA                   DME Arranged: N/A DME Agency: NA       HH Arranged: NA HH Agency: NA        Prior Living Arrangements/Services   Lives with:: Self Patient language and need for interpreter reviewed:: Yes Do you feel safe going back to the place where you live?: Yes      Need for Family Participation in Patient Care: No (Comment) Care giver support system in place?: No (comment) Current home services: Other (comment) (NA) Criminal Activity/Legal Involvement Pertinent to Current Situation/Hospitalization: No - Comment as needed  Activities of Daily Living      Permission Sought/Granted Permission sought to share information with : Family Supports Permission granted to share information with : Yes, Verbal Permission Granted  Share Information with NAME: Jayliani, Wanner (Daughter)   [phone]           Emotional Assessment Appearance:: Other (Comment Required (UTA) Attitude/Demeanor/Rapport: Unable to Assess Affect (typically observed): Unable to Assess Orientation: : Oriented to Self, Oriented to Place, Oriented to  Time, Oriented to Situation Alcohol / Substance Use: Not Applicable Psych Involvement: No (comment)  Admission diagnosis:  Hypotension [I95.9] Patient Active Problem List   Diagnosis Date Noted   Hypotension 03/11/2024   Dyspnea on exertion 03/11/2024   Radiation-induced esophageal stricture 01/20/2024   Esophageal dysphagia 01/20/2024   Encounter for antineoplastic chemotherapy 10/31/2023   Thrombocytopenia (HCC) 10/24/2023   Chemotherapy induced neutropenia (HCC) 08/24/2023   Small cell lung cancer (HCC) 08/01/2023   Hilar mass 07/20/2023   Tobacco use 07/20/2023   Statin intolerance 09/18/2018   Thoracic aorta atherosclerosis (HCC) 11/30/2016   TIA (transient ischemic attack) 08/19/2016   Iron  deficiency anemia 07/20/2016   Pancytopenia (HCC) 06/25/2016   Protein-calorie malnutrition, moderate (HCC) 06/23/2016   Osteoporosis 01/02/2015   Depression, major, in remission (HCC) 01/02/2015   Carotid stenosis, asymptomatic 12/16/2014   HTN (hypertension) 10/03/2014   Medication management 10/03/2014   COPD (chronic obstructive pulmonary disease) (HCC)    Asthma-COPD overlap syndrome (HCC)    Vitamin D  deficiency    Mixed hyperlipidemia 04/30/2008   GERD 04/30/2008   Recurrent Upper GI bleed 04/30/2008   PCP:  Patient, No Pcp Per Pharmacy:   [id number] - [place], [place] - [address]  [address] [place] [address] Phone: [phone] Fax: [phone]     Social Drivers of Health (SDOH) Social History: SDOH Screenings   Food Insecurity: No Food Insecurity (03/11/2024)  Housing: Low Risk  (03/11/2024)  Transportation Needs: No Transportation Needs (03/11/2024)  Utilities: Not At Risk (03/11/2024)   Depression (PHQ2-9): Low Risk  (03/18/2022)  Financial Resource Strain: Unknown (09/18/2017)  Physical Activity: Unknown (09/18/2017)  Social Connections: Unknown (03/11/2024)  Stress: Stress Concern Present (09/18/2017)  Tobacco Use: Medium Risk (03/11/2024)   SDOH Interventions:     Readmission Risk Interventions    03/13/2024   12:58 PM  Readmission Risk Prevention Plan  Transportation Screening Complete  PCP or Specialist Appt within 5-7 Days Complete  Home Care Screening Complete  Medication Review (RN CM) Complete

## 2024-03-13 NOTE — Progress Notes (Signed)
 [facility]  PROGRESS NOTE  [name] NFA:[id number] DOB: 1955/12/15 DOA: 03/11/2024 PCP: Patient, No Pcp Per   Brief HPI:    68 y.o. female with medical history significant for small cell lung cancer of right lung s/p chemoradiation, COPD, HTN, HLD, pancytopenia, history of CVA, radiation-induced esophageal stricture s/p dilation who presented to the ED for evaluation of shortness of breath.   CTA chest negative for PE. Scattered groundglass opacity in the lungs bilaterally are increased from prior exam, possible pneumonitis versus edema. Stable scattered pulmonary nodules measuring up to 4 mm noted. Soft tissue mass/lymph node at the right hilum measuring 1.5 x 1.1 cm is increased in size from most recent exam. Small right pleural effusion and trace left pleural effusion with atelectasis noted.    Assessment/Plan:   Acute hypoxemic respiratory failure - Secondary to unresolved pneumonia versus CHF - Patient recently treated for pneumonia as outpatient - CT chest negative for PE but showed pneumonitis versus edema - Started on IV Lasix  with good urine output - Continue empiric Rocephin  and Zithromax  -  procalcitonin is 0.37, follow blood cultures are pending  Pancytopenia - Chronic -Improving - Getting chemotherapy as outpatient -Follow cbc in am   COPD No active wheezing and does not seem to be in acute exacerbation at time of admission.   Continue albuterol  nebulizer as needed.  History of small cell lung cancer - Followed by oncology as outpatient - Dr. Marguerita Shih will see patient in a.m.  Goals of care - Palliative care consulted for goals of care discussion  Medications     Chlorhexidine  Gluconate Cloth  6 each Topical Daily   feeding supplement  237 mL Oral BID BM   mouth rinse  15 mL Mouth Rinse 4 times per day   pantoprazole   80 mg Oral BID AC     Data Reviewed:   CBG:  Recent Labs  Lab 03/11/24 1223  GLUCAP 84    SpO2: 95 % O2 Flow Rate  (L/min): 2 L/min    Vitals:   03/13/24 0800 03/13/24 0900 03/13/24 1148 03/13/24 1236  BP: (!) 116/59 (!) 113/53  101/65  Pulse: 66 77  77  Resp: 13 20  15   Temp: 98.2 F (36.8 C)  97.9 F (36.6 C)   TempSrc: Oral  Oral   SpO2: 99% 97%  95%  Weight:      Height:          Data Reviewed:  Basic Metabolic Panel: Recent Labs  Lab 03/11/24 1230 03/11/24 1819 03/12/24 0542  NA 137  --  133*  K 4.1  --  3.8  CL 104  --  101  CO2 23  --  23  GLUCOSE 87  --  116*  BUN 17  --  16  CREATININE 0.61  --  0.55  CALCIUM 8.0*  --  7.2*  MG  --  3.3* 2.2    CBC: Recent Labs  Lab 03/11/24 1230 03/12/24 0542 03/13/24 0829  WBC 3.9* 1.3* 2.8*  NEUTROABS  --   --  1.9  HGB 9.2* 7.7* 7.6*  HCT 27.2* 23.1* 23.7*  MCV 111.9* 114.4* 116.2*  PLT 50* 37* 39*    LFT Recent Labs  Lab 03/11/24 1230  AST 14*  ALT 9  ALKPHOS 154*  BILITOT 0.2  PROT 5.4*  ALBUMIN 2.5*     Antibiotics: Anti-infectives (From admission, onward)    Start     Dose/Rate Route Frequency Ordered Stop   03/12/24  0200  cefTRIAXone  (ROCEPHIN ) 2 g in sodium chloride  0.9 % 100 mL IVPB        2 g 200 mL/hr over 30 Minutes Intravenous Every 24 hours 03/11/24 2104     03/11/24 2115  azithromycin  (ZITHROMAX ) 500 mg in sodium chloride  0.9 % 250 mL IVPB        500 mg 250 mL/hr over 60 Minutes Intravenous Every 24 hours 03/11/24 2104     03/11/24 1745  ceFEPIme (MAXIPIME) 2 g in sodium chloride  0.9 % 100 mL IVPB        2 g 200 mL/hr over 30 Minutes Intravenous  Once 03/11/24 1734 03/11/24 1848        DVT prophylaxis: SCDs  Code Status: Full code  Family Communication:    CONSULTS    Subjective    Breathing has improved.  Objective    Physical Examination:   Appears in no acute distress S1-S2, regular Lungs clear to auscultation bilaterally Abdomen is soft, nontender  Status is: Inpatient:         [name]   [facility] If 7PM-7AM, please contact  night-coverage at [email or web address], Office  [phone]   03/13/2024, 2:08 PM  LOS: 2 days

## 2024-03-13 NOTE — Plan of Care (Signed)

## 2024-03-14 DIAGNOSIS — R1310 Dysphagia, unspecified: Secondary | ICD-10-CM

## 2024-03-14 DIAGNOSIS — D72819 Decreased white blood cell count, unspecified: Secondary | ICD-10-CM

## 2024-03-14 DIAGNOSIS — J90 Pleural effusion, not elsewhere classified: Secondary | ICD-10-CM | POA: Diagnosis not present

## 2024-03-14 DIAGNOSIS — R0602 Shortness of breath: Secondary | ICD-10-CM

## 2024-03-14 DIAGNOSIS — D696 Thrombocytopenia, unspecified: Secondary | ICD-10-CM

## 2024-03-14 DIAGNOSIS — R627 Adult failure to thrive: Secondary | ICD-10-CM | POA: Diagnosis not present

## 2024-03-14 DIAGNOSIS — D649 Anemia, unspecified: Secondary | ICD-10-CM

## 2024-03-14 DIAGNOSIS — J984 Other disorders of lung: Secondary | ICD-10-CM | POA: Diagnosis not present

## 2024-03-14 DIAGNOSIS — R0600 Dyspnea, unspecified: Secondary | ICD-10-CM | POA: Diagnosis not present

## 2024-03-14 DIAGNOSIS — R0609 Other forms of dyspnea: Secondary | ICD-10-CM | POA: Diagnosis not present

## 2024-03-14 DIAGNOSIS — C3490 Malignant neoplasm of unspecified part of unspecified bronchus or lung: Secondary | ICD-10-CM

## 2024-03-14 DIAGNOSIS — Z87891 Personal history of nicotine dependence: Secondary | ICD-10-CM

## 2024-03-14 LAB — CBC WITH DIFFERENTIAL/PLATELET
Abs Immature Granulocytes: 0.1 10*3/uL — ABNORMAL HIGH (ref 0.00–0.07)
Basophils Absolute: 0 10*3/uL (ref 0.0–0.1)
Basophils Relative: 0 %
Eosinophils Absolute: 0 10*3/uL (ref 0.0–0.5)
Eosinophils Relative: 1 %
HCT: 24 % — ABNORMAL LOW (ref 36.0–46.0)
Hemoglobin: 8.1 g/dL — ABNORMAL LOW (ref 12.0–15.0)
Immature Granulocytes: 2 %
Lymphocytes Relative: 18 %
Lymphs Abs: 0.8 10*3/uL (ref 0.7–4.0)
MCH: 39.1 pg — ABNORMAL HIGH (ref 26.0–34.0)
MCHC: 33.8 g/dL (ref 30.0–36.0)
MCV: 115.9 fL — ABNORMAL HIGH (ref 80.0–100.0)
Monocytes Absolute: 0.5 10*3/uL (ref 0.1–1.0)
Monocytes Relative: 13 %
Neutro Abs: 2.7 10*3/uL (ref 1.7–7.7)
Neutrophils Relative %: 66 %
Platelets: 49 10*3/uL — ABNORMAL LOW (ref 150–400)
RBC: 2.07 MIL/uL — ABNORMAL LOW (ref 3.87–5.11)
RDW: 18.7 % — ABNORMAL HIGH (ref 11.5–15.5)
WBC: 4.1 10*3/uL (ref 4.0–10.5)
nRBC: 1 % — ABNORMAL HIGH (ref 0.0–0.2)

## 2024-03-14 LAB — COMPREHENSIVE METABOLIC PANEL WITH GFR
ALT: 13 U/L (ref 0–44)
AST: 27 U/L (ref 15–41)
Albumin: 2.1 g/dL — ABNORMAL LOW (ref 3.5–5.0)
Alkaline Phosphatase: 108 U/L (ref 38–126)
Anion gap: 5 (ref 5–15)
BUN: 7 mg/dL — ABNORMAL LOW (ref 8–23)
CO2: 31 mmol/L (ref 22–32)
Calcium: 8.1 mg/dL — ABNORMAL LOW (ref 8.9–10.3)
Chloride: 100 mmol/L (ref 98–111)
Creatinine, Ser: 0.38 mg/dL — ABNORMAL LOW (ref 0.44–1.00)
GFR, Estimated: >60 mL/min (ref >60)
Glucose, Bld: 82 mg/dL (ref 70–99)
Potassium: 4.6 mmol/L (ref 3.5–5.1)
Sodium: 136 mmol/L (ref 135–145)
Total Bilirubin: 0.2 mg/dL (ref 0.0–1.2)
Total Protein: 4.9 g/dL — ABNORMAL LOW (ref 6.5–8.1)

## 2024-03-14 LAB — LEGIONELLA PNEUMOPHILA SEROGP 1 UR AG: L. pneumophila Serogp 1 Ur Ag: NEGATIVE

## 2024-03-14 MED ORDER — FUROSEMIDE 10 MG/ML IJ SOLN: 20.0000 mg | Freq: Once | INTRAMUSCULAR | Status: DC

## 2024-03-14 NOTE — Evaluation (Signed)
 Physical Therapy Evaluation Patient Details Name: [name] MRN: [id number] DOB: Sep 29, 1956 Today's Date: 03/14/2024  History of Present Illness  68 y.o. female presented to [facility]  03/12/23 with SOB. CT Negative PE,  demonstrated ground galss infiltrates, small right  plearal effusion.  PMH:  small cell lung cancer of right lung s/p chemoradiation, COPD, HTN, HLD, pancytopenia, history of CVA, radiation-induced esophageal stricture s/p dilation  Clinical Impression  Pt admitted with above diagnosis.  Pt currently with functional limitations due to the deficits listed below (see PT Problem List). Pt will benefit from acute skilled PT to increase their independence and safety with mobility to allow discharge.     The patient is eager to ambulate and tolerated x 160' using no device. Patient maintained >94% on RA . Patient resides alone, is independent, does not drive, support from family and friends.   Patient should progress to return home with family support.    If plan is discharge home, recommend the following: Assistance with cooking/housework;Assist for transportation;Help with stairs or ramp for entrance   Can travel by private vehicle    yes    Equipment Recommendations None recommended by PT  Recommendations for Other Services       Functional Status Assessment Patient has had a recent decline in their functional status and demonstrates the ability to make significant improvements in function in a reasonable and predictable amount of time.     Precautions / Restrictions Precautions Precaution/Restrictions Comments: monitor SATs Restrictions Weight Bearing Restrictions Per Provider Order: No      Mobility  Bed Mobility Overal bed mobility: Independent                  Transfers Overall transfer level: Needs assistance Equipment used: None Transfers: Sit to/from Stand Sit to Stand: Supervision                Ambulation/Gait Ambulation/Gait  assistance: Contact guard assist Gait Distance (Feet): 160 Feet Assistive device: None Gait Pattern/deviations: Step-through pattern Gait velocity: decr     General Gait Details: patient steady with  ambulation. patient on RA, SPO2 > 94%  Stairs            Wheelchair Mobility     Tilt Bed    Modified Rankin (Stroke Patients Only)       Balance Overall balance assessment: Mild deficits observed, not formally tested                                           Pertinent Vitals/Pain Pain Assessment Pain Assessment: No/denies pain    Home Living Family/patient expects to be discharged to:: Private residence Living Arrangements: Alone Available Help at Discharge: Family;Friend(s);Available PRN/intermittently Type of Home: House Home Access: Stairs to enter Entrance Stairs-Rails: Doctor, general practice of Steps: 2   Home Layout: One level Home Equipment: None      Prior Function Prior Level of Function : Independent/Modified Independent                     Extremity/Trunk Assessment   Upper Extremity Assessment Upper Extremity Assessment: Overall WFL for tasks assessed    Lower Extremity Assessment Lower Extremity Assessment: Overall WFL for tasks assessed    Cervical / Trunk Assessment Cervical / Trunk Assessment: Kyphotic  Communication        Cognition Arousal: Alert Behavior During Therapy: [facility] for  tasks assessed/performed   PT - Cognitive impairments: No apparent impairments                         Following commands: Intact       Cueing       General Comments      Exercises     Assessment/Plan    PT Assessment Patient needs continued PT services  PT Problem List Decreased activity tolerance;Decreased safety awareness;Decreased knowledge of precautions;Decreased knowledge of use of DME       PT Treatment Interventions DME instruction;Therapeutic activities;Gait training;Functional  mobility training;Therapeutic exercise;Patient/family education    PT Goals (Current goals can be found in the Care Plan section)  Acute Rehab PT Goals Patient Stated Goal: go home PT Goal Formulation: With patient Time For Goal Achievement: 03/28/24 Potential to Achieve Goals: Good    Frequency Min 2X/week     Co-evaluation               AM-PAC PT "6 Clicks" Mobility  Outcome Measure Help needed turning from your back to your side while in a flat bed without using bedrails?: None Help needed moving from lying on your back to sitting on the side of a flat bed without using bedrails?: None Help needed moving to and from a bed to a chair (including a wheelchair)?: None Help needed standing up from a chair using your arms (e.g., wheelchair or bedside chair)?: None Help needed to walk in hospital room?: A Little Help needed climbing 3-5 steps with a railing? : A Little 6 Click Score: 22    End of Session Equipment Utilized During Treatment: Gait belt Activity Tolerance: Patient tolerated treatment well Patient left: in chair;with call bell/phone within reach;with chair alarm set Nurse Communication: Mobility status PT Visit Diagnosis: Unsteadiness on feet (R26.81);Difficulty in walking, not elsewhere classified (R26.2)    Time: 1610-9604 PT Time Calculation (min) (ACUTE ONLY): 25 min   Charges:   PT Evaluation $PT Eval Low Complexity: 1 Low PT Treatments $Gait Training: 8-22 mins PT General Charges $$ ACUTE PT VISIT: 1 Visit         Abelina Hoes PT Acute Rehabilitation Services Office [phone]   Dareen Ebbing 03/14/2024, 1:47 PM

## 2024-03-14 NOTE — Telephone Encounter (Signed)
 Dr Venice Gillis would you be willing to do EGD with dil on this pt on 6/3?

## 2024-03-14 NOTE — TOC Progression Note (Signed)
 Transition of Care [facility]) - Progression Note    Patient Details  Name: [name] MRN: [id number] Date of Birth: 1956/03/12  [facility] CM/SW Contact  Tessie Fila, RN Phone Number: 03/14/2024, 3:36 PM  Clinical Narrative:    Met with pt in room to discuss PT recommendation for [facility]. Pt verbalizes understanding and is agreeable for NCM to set up [facility] services. Pt does not have any preference. NCM spoke with Bartholomew Light with [facility] and she has agreed to accept for HHPT/OT services at [facility]. Pt will need HH orders at [facility]. TOC continuing to follow.   Expected Discharge Plan: Home/Self Care Barriers to Discharge: Continued Medical Work up  Expected Discharge Plan and Services In-house Referral: NA Discharge Planning Services: CM Consult Post Acute Care Choice: NA                   DME Arranged: N/A DME Agency: NA       HH Arranged: NA HH Agency: NA         Social Determinants of Health (SDOH) Interventions SDOH Screenings   Food Insecurity: No Food Insecurity (03/11/2024)  Housing: Low Risk  (03/11/2024)  Transportation Needs: No Transportation Needs (03/11/2024)  Utilities: Not At Risk (03/11/2024)  Depression (PHQ2-9): Low Risk  (03/18/2022)  Financial Resource Strain: Unknown (09/18/2017)  Physical Activity: Unknown (09/18/2017)  Social Connections: Unknown (03/11/2024)  Stress: Stress Concern Present (09/18/2017)  Tobacco Use: Medium Risk (03/11/2024)    Readmission Risk Interventions    03/13/2024   12:58 PM  Readmission Risk Prevention Plan  Transportation Screening Complete  PCP or Specialist Appt within 5-7 Days Complete  Home Care Screening Complete  Medication Review (RN CM) Complete

## 2024-03-14 NOTE — Progress Notes (Addendum)
 [name]   DOB:[birth year]   ZO#:[id number]      ASSESSMENT & PLAN:  [name] is a 68 year old female patient with medical history significant for small cell lung cancer.  She is status post chemotherapy and radiation.  Patient was admitted on 03/11/2024 due to shortness of breath/respiratory distress.  Dyspnea on exertion Shortness of breath COPD - Patient admitted with complaints of shortness of breath on 03/11/2024 - Continue respiratory therapy as ordered - Likely secondary to malignancy and comorbidities - Continue supportive care  Small cell lung cancer -Diagnosed October 2024 - Status post concurrent chemoradiation with cisplatin  and etoposide  x 4 cycles. - Seen in outpatient oncology office on 02/20/2024 at which time CT scan in 3 months was recommended.  Also consideration for prophylactic cranial irradiation if performance status improved. - Medical oncology/Dr. Liam Redhead following closely  Dysphagia and odynophagia - Status post esophageal stretching procedures - Continue close GI management  Pancytopenia: - Leukopenia-WBC low 2.8, no intervention required at this time. - Anemia-hemoglobin 7.6.  Recommend PRBC transfusion for Hgb <7.0.  No intervention required at this time - Thrombocytopenia-platelets low 39K.  Transfuse platelets for counts <20 K or <50 K with active bleeding.  Monitor closely for bleeding. - Continue to monitor CBC with differential  Visual impairment - Continue supportive care  Code Status Full  Subjective:  Patient seen awake and alert being assisted with hygiene care by nursing staff.  She is frail and cachectic appearing.  O2 via nasal cannula intact.  Reports that she still has "trouble with deep breaths".  Complains of ongoing cough and nausea without vomiting.  No acute distress is noted at this time.  Objective:   Intake/Output Summary (Last 24 hours) at 03/14/2024 0950 Last data filed at 03/14/2024 0550 Gross per 24 hour   Intake 960.18 ml  Output 500 ml  Net 460.18 ml     PHYSICAL EXAMINATION: ECOG PERFORMANCE STATUS: 3 - Symptomatic, >50% confined to bed  Vitals:   03/14/24 0500 03/14/24 0800  BP: (!) 108/59   Pulse: 71   Resp: 13   Temp:  98 F (36.7 C)  SpO2: 95%    Filed Weights   03/11/24 1226 03/12/24 0500  Weight: 81 lb (36.7 kg) 91 lb 11.4 oz (41.6 kg)    GENERAL: alert, + frail + cachectic SKIN: skin color, texture, turgor are normal, no rashes or significant lesions EYES: + Visual impairment OROPHARYNX: no exudate, no erythema and lips, buccal mucosa, and tongue normal  NECK: supple, thyroid  normal size, non-tender, without nodularity LYMPH: no palpable lymphadenopathy in the cervical, axillary or inguinal LUNGS: clear to auscultation and percussion with normal breathing effort HEART: regular rate & rhythm and no murmurs and no lower extremity edema ABDOMEN: abdomen soft, non-tender and normal bowel sounds MUSCULOSKELETAL: no cyanosis of digits and no clubbing  PSYCH: alert & oriented x 3 with fluent speech NEURO: no focal motor/sensory deficits   All questions were answered. The patient knows to call the clinic with any problems, questions or concerns.   The total time spent in the appointment was 40 minutes encounter with patient including review of chart and various tests results, discussions about plan of care and coordination of care plan  Jacqualin Mate, NP 03/14/2024 9:50 AM    Labs Reviewed:  Lab Results  Component Value Date   WBC 2.8 (L) 03/13/2024   HGB 7.6 (L) 03/13/2024   HCT 23.7 (L) 03/13/2024   MCV 116.2 (H) 03/13/2024   PLT  39 (L) 03/13/2024   Recent Labs    11/21/23 1247 02/13/24 1410 03/11/24 1230 03/12/24 0542  NA 140 142 137 133*  K 3.5 4.2 4.1 3.8  CL 106 113* 104 101  CO2 26 24 23 23   GLUCOSE 142* 97 87 116*  BUN 11 10 17 16   CREATININE 0.63 0.69 0.61 0.55  CALCIUM 8.9 7.9* 8.0* 7.2*  GFRNONAA >60 >60 >60 >60  PROT 6.2* 5.6* 5.4*  --    ALBUMIN 3.6 3.2* 2.5*  --   AST 10* 11* 14*  --   ALT 7 5 9   --   ALKPHOS 105 116 154*  --   BILITOT 0.2 0.1 0.2  --     Studies Reviewed:  ECHOCARDIOGRAM COMPLETE Result Date: 03/12/2024    ECHOCARDIOGRAM REPORT   Patient Name:   [name] Date of Exam: 03/12/2024 Medical Rec #:  [id number]       Height:       61.0 in Accession #:    [id number]      Weight:       91.7 lb Date of Birth:  1956/08/18       BSA:          1.356 m Patient Age:    67 years        BP:           115/60 mmHg Patient Gender: F               HR:           73 bpm. Exam Location:  Inpatient Procedure: 2D Echo, Cardiac Doppler and Color Doppler (Both Spectral and Color            Flow Doppler were utilized during procedure). Indications:    Dyspnea R06.00  History:        Patient has prior history of Echocardiogram examinations, most                 recent 09/17/2017.  Sonographer:    Hersey Lorenzo RDCS Referring Phys: [id number] VISHAL R PATEL IMPRESSIONS  1. Left ventricular ejection fraction, by estimation, is 55%. The left ventricle has normal function. The left ventricle has no regional wall motion abnormalities. Left ventricular diastolic parameters were normal.  2. Right ventricular systolic function is normal. The right ventricular size is normal.  3. Right atrial size was mildly dilated.  4. The mitral valve is abnormal. Trivial mitral valve regurgitation. No evidence of mitral stenosis.  5. The aortic valve is tricuspid. There is moderate calcification of the aortic valve. There is moderate thickening of the aortic valve. Aortic valve regurgitation is not visualized. Aortic valve sclerosis/calcification is present, without any evidence of aortic stenosis.  6. The inferior vena cava is normal in size with <50% respiratory variability, suggesting right atrial pressure of 8 mmHg. FINDINGS  Left Ventricle: Left ventricular ejection fraction, by estimation, is 55%. The left ventricle has normal function. The left ventricle has no  regional wall motion abnormalities. Strain was performed and the global longitudinal strain is indeterminate. The left ventricular internal cavity size was normal in size. There is no left ventricular hypertrophy. Left ventricular diastolic parameters were normal. Right Ventricle: The right ventricular size is normal. No increase in right ventricular wall thickness. Right ventricular systolic function is normal. Left Atrium: Left atrial size was normal in size. Right Atrium: Right atrial size was mildly dilated. Pericardium: There is no evidence of pericardial effusion. Mitral Valve: The mitral valve is  abnormal. There is mild thickening of the mitral valve leaflet(s). There is mild calcification of the mitral valve leaflet(s). Trivial mitral valve regurgitation. No evidence of mitral valve stenosis. Tricuspid Valve: The tricuspid valve is normal in structure. Tricuspid valve regurgitation is mild . No evidence of tricuspid stenosis. Aortic Valve: The aortic valve is tricuspid. There is moderate calcification of the aortic valve. There is moderate thickening of the aortic valve. Aortic valve regurgitation is not visualized. Aortic valve sclerosis/calcification is present, without any  evidence of aortic stenosis. Pulmonic Valve: The pulmonic valve was normal in structure. Pulmonic valve regurgitation is not visualized. No evidence of pulmonic stenosis. Aorta: The aortic root is normal in size and structure. Venous: The inferior vena cava is normal in size with less than 50% respiratory variability, suggesting right atrial pressure of 8 mmHg. IAS/Shunts: No atrial level shunt detected by color flow Doppler. Additional Comments: 3D was performed not requiring image post processing on an independent workstation and was indeterminate.  LEFT VENTRICLE PLAX 2D LVIDd:         4.20 cm   Diastology LVIDs:         3.00 cm   LV e' medial:    9.46 cm/s LV PW:         0.80 cm   LV E/e' medial:  7.7 LV IVS:        0.70 cm   LV e'  lateral:   8.49 cm/s LVOT diam:     1.90 cm   LV E/e' lateral: 8.6 LV SV:         45 LV SV Index:   33 LVOT Area:     2.84 cm  RIGHT VENTRICLE             IVC RV S prime:     12.20 cm/s  IVC diam: 2.00 cm TAPSE (M-mode): 1.7 cm LEFT ATRIUM         Index LA diam:    3.00 cm 2.21 cm/m  AORTIC VALVE LVOT Vmax:   73.10 cm/s LVOT Vmean:  49.300 cm/s LVOT VTI:    0.160 m  AORTA Ao Root diam: 2.80 cm Ao Asc diam:  2.90 cm MITRAL VALVE               TRICUSPID VALVE MV Area (PHT): 2.67 cm    TR Peak grad:   21.0 mmHg MV Decel Time: 284 msec    TR Vmax:        229.00 cm/s MV E velocity: 73.30 cm/s MV A velocity: 63.00 cm/s  SHUNTS MV E/A ratio:  1.16        Systemic VTI:  0.16 m                            Systemic Diam: 1.90 cm Janelle Mediate MD Electronically signed by Janelle Mediate MD Signature Date/Time: 03/12/2024/10:29:48 AM    Final    CT Head Wo Contrast Result Date: 03/11/2024 CLINICAL DATA:  Altered level of consciousness, confusion, malaise EXAM: CT HEAD WITHOUT CONTRAST TECHNIQUE: Contiguous axial images were obtained from the base of the skull through the vertex without intravenous contrast. RADIATION DOSE REDUCTION: This exam was performed according to the departmental dose-optimization program which includes automated exposure control, adjustment of the mA and/or kV according to patient size and/or use of iterative reconstruction technique. COMPARISON:  09/16/2017 FINDINGS: Brain: No acute infarct or hemorrhage. The lateral ventricles and midline structures are unremarkable. No  acute extra-axial fluid collections. No mass effect. Vascular: No hyperdense vessel or unexpected calcification. Skull: Postsurgical changes from prior left occipital craniotomy. No acute bony abnormalities. Sinuses/Orbits: Trace left mastoid effusion. Paranasal sinuses are clear. Other: None. IMPRESSION: 1. Stable head CT, no acute intracranial process. Electronically Signed   By: Bobbye Burrow M.D.   On: 03/11/2024 20:02   CT Angio  Chest PE W and/or Wo Contrast Result Date: 03/11/2024 CLINICAL DATA:  Pulmonary embolism suspected, high probability. Pulmonary embolism versus pneumonia. Dyspnea with activity, confusion, and malaise. EXAM: CT ANGIOGRAPHY CHEST WITH CONTRAST TECHNIQUE: Multidetector CT imaging of the chest was performed using the standard protocol during bolus administration of intravenous contrast. Multiplanar CT image reconstructions and MIPs were obtained to evaluate the vascular anatomy. RADIATION DOSE REDUCTION: This exam was performed according to the departmental dose-optimization program which includes automated exposure control, adjustment of the mA and/or kV according to patient size and/or use of iterative reconstruction technique. CONTRAST:  75mL OMNIPAQUE  IOHEXOL  350 MG/ML SOLN COMPARISON:  02/13/2024, 11/14/2023. FINDINGS: Cardiovascular: The heart is normal in size and there is a trace pericardial effusion. Scattered coronary artery calcifications are noted. There is atherosclerotic calcification of the aorta without evidence of aneurysm. The pulmonary trunk is normal in caliber. No evidence of pulmonary embolism is seen. Mediastinum/Nodes: No mediastinal lymphadenopathy is seen. There is a prominent soft tissue mass at the right hilum measuring 1.5 x 1.1 cm, increased in size from the prior exam. Soft tissue edema is noted in the mediastinum and unchanged from the prior exam. The thyroid  gland, trachea, and esophagus are within normal limits. Lungs/Pleura: There is a small right pleural effusion and trace left pleural effusion. Centrilobular emphysematous changes are noted in the lungs. Scattered ground-glass opacities are noted in the lungs and increased from the prior exam with a right upper lobe predominance. Compressive atelectasis is present in the right lower lobe. There is stable scattered pulmonary nodules measuring up to 4 mm in the right middle lobe. Upper Abdomen: No acute abnormality. Musculoskeletal:  There are stable compression deformities in the superior endplate at L2 and T4. No acute osseous abnormality is seen. Review of the MIP images confirms the above findings. IMPRESSION: 1. No evidence of pulmonary embolism. 2. Scattered ground-glass opacities in the lungs bilaterally, increased from the prior exam, possible pneumonitis versus edema. 3. Stable scattered pulmonary nodules measuring up to 4 mm. 4. Soft tissue mass/lymph node at the right hilum measuring 1.5 x 1.1 cm, increased in size from most recent exam. 5. Small right pleural effusion trace left pleural effusion with associated atelectasis. 6. Coronary artery calcifications. 7. Aortic atherosclerosis. Electronically Signed   By: Wyvonnia Heimlich M.D.   On: 03/11/2024 15:52   DG Chest Portable 1 View Result Date: 03/11/2024 CLINICAL DATA:  Shortness of breath EXAM: PORTABLE CHEST 1 VIEW COMPARISON:  X-ray 03/09/2024 FINDINGS: Hyperinflation. Normal cardiopericardial silhouette. Calcified aorta. Tiny right pleural effusion again seen. The hazy ill-defined right upper lung opacity is slightly increased today. Recommend follow-up. No pneumothorax or edema. Old left-sided rib fractures. IMPRESSION: Slight increase in right upper perihilar lung opacity. Tiny right effusion. Hyperinflation.  Recommend continued follow-up. Electronically Signed   By: Adrianna Horde M.D.   On: 03/11/2024 13:23   DG Chest 2 View Result Date: 03/09/2024 CLINICAL DATA:  weakness. EXAM: CHEST - 2 VIEW COMPARISON:  CT scan chest from 02/13/2024. FINDINGS: There is heterogeneous alveolar and interstitial opacity in the right upper lobe, highly concerning for atypical pneumonia versus pneumonitis. Findings  are essentially unchanged since the prior study, considering the difference in technique. Bilateral lung fields are otherwise clear. Bilateral costophrenic angles are clear. Normal cardio-mediastinal silhouette. No acute osseous abnormalities. Multiple old healed left posterior  rib fractures noted. The soft tissues are within normal limits. IMPRESSION: *Redemonstration of heterogeneous alveolar and interstitial opacity in the right upper lobe, highly concerning for atypical pneumonia versus pneumonitis. Electronically Signed   By: Beula Brunswick M.D.   On: 03/09/2024 15:52   CT Chest W Contrast Result Date: 02/18/2024 CLINICAL DATA:  Small cell lung cancer, staging. History of lung cancer diagnosed in 2024. Chemotherapy and radiation therapy completed. * Tracking Code: BO * EXAM: CT CHEST WITH CONTRAST TECHNIQUE: Multidetector CT imaging of the chest was performed during intravenous contrast administration. RADIATION DOSE REDUCTION: This exam was performed according to the departmental dose-optimization program which includes automated exposure control, adjustment of the mA and/or kV according to patient size and/or use of iterative reconstruction technique. CONTRAST:  75mL OMNIPAQUE  IOHEXOL  300 MG/ML  SOLN COMPARISON:  Chest CT 11/14/2023 and 07/08/2023.  PET-CT 07/19/2023. FINDINGS: Cardiovascular: No acute vascular findings. There is moderate atherosclerosis of the aorta, great vessels and coronary arteries. The heart size is normal. There is no pericardial effusion. Mediastinum/Nodes: Stable mildly prominent right hilar lymph node measuring 8 mm short axis on image 69/2 and stable soft tissue edema throughout the middle mediastinum, likely treatment related. No enlarged mediastinal, hilar or axillary lymph nodes are identified. There is stable esophageal wall thickening, likely treatment related. The thyroid  gland and trachea appear unremarkable. Lungs/Pleura: New small dependent right pleural effusion. No left pleural effusion or pneumothorax. Mild centrilobular emphysema with biapical scarring and scattered central airway thickening. There are new patchy ground-glass opacities in both upper lobes, greater on the right, likely treatment related. Unchanged 5 mm nodule in the right  middle lobe on image 113/7. No confluent airspace disease or enlarging pulmonary nodules. Upper abdomen: The visualized upper abdomen appears stable, without significant findings. Aortic and branch vessel atherosclerosis noted. Musculoskeletal/Chest wall: No chest wall mass or osseous metastases are identified. There is a new mild superior endplate compression deformity at T4 which demonstrates no pathologic features. Stable chronic superior endplate compression deformity at L2. Stable chronic left-sided rib fractures and posttraumatic deformity of the right scapula. IMPRESSION: 1. No evidence of local recurrence or metastatic disease. 2. New patchy ground-glass opacities in both upper lobes, greater on the right, likely treatment related. Differential includes asymmetric edema and inflammation. Correlate clinically. 3. New small dependent right pleural effusion. 4. Stable mildly prominent right hilar lymph node and soft tissue edema throughout the middle mediastinum, likely treatment related. 5. New mild superior endplate compression deformity at T4 which demonstrates no pathologic features. The bones otherwise appear unchanged. 6. Aortic Atherosclerosis (ICD10-I70.0) and Emphysema (ICD10-J43.9). Electronically Signed   By: Elmon Hagedorn M.D.   On: 02/18/2024 13:27   DG ESOPHAGUS DILATION Result Date: 02/14/2024 CLINICAL DATA:  Esophageal dilatation. History of small cell lung cancer. EXAM: ESOPHAGEAL DILATATION - NO REPORT COMPARISON:  Chest CT 02/13/2024. FINDINGS: C-arm fluoroscopy was provided in the operating room without the presence of a radiologist.19 seconds fluoroscopy time. 0.74 mGy air kerma. Seven C-arm fluoroscopic images were obtained intraoperatively and are submitted for post operative interpretation. Images demonstrate balloon dilatation of the proximal to midthoracic esophagus. No complications are identified. Please see intraoperative findings for further detail. IMPRESSION: Intraoperative  fluoroscopic guidance for esophageal dilatation. Electronically Signed   By: Elmon Hagedorn M.D.   On:  02/14/2024 15:07   ADDENDUM: Hematology/Oncology Attending: The patient is seen and examined today.  Her daughter and several other family members were at the bedside.  I reviewed her records, lab, scan and I agree with the above note.  She is a very pleasant 68 years old white female with history of limited stage small cell lung cancer who completed a course of systemic chemotherapy with cisplatin  and etoposide .  Her last systemic chemotherapy was in May 2025 and she tolerated it well except for the fatigue.  She had repeat CT scan of the chest after completion of the course of systemic chemotherapy and radiation which showed significant improvement of her disease.  She is currently on observation but she was admitted to the hospital on 03/11/2024 complaining of progressive dyspnea and cough productive of yellow and green sputum in addition to swelling in the lower extremities.  She had CT angiogram of the chest performed on the day of admission and that showed no evidence for pulmonary embolus but there was scattered groundglass opacity in the lungs bilaterally increased from the prior exam and suspicious for pneumonitis versus edema.  The soft tissue mass/lymph node in the right hilum increased slightly from the previous exam but this is too short for disease progression and it could be secondary to the recent inflammation. The patient was treated with a course of antibiotics and she is feeling much better.  She continues to complain of fatigue and swelling especially of the lower extremities. She still have small right pleural effusion and trace left pleural effusion associated with atelectasis.  I recommended for the patient to continue her current treatment as planned and she is scheduled to have repeat CT scan of the chest in around 2 months from now for restaging of her disease. We will see her back  for follow-up visit after her repeat imaging studies in August 2025. The patient was advised to call immediately if she has any concerning symptoms after discharge. Thank you for taking good care of Ms. Lovings, please call if you have any questions. Disclaimer: This note was dictated with voice recognition software. Similar sounding words can inadvertently be transcribed and may be missed upon review. Aurelio Blower, MD

## 2024-03-14 NOTE — Plan of Care (Signed)
  Problem: Clinical Measurements: Goal: Respiratory complications will improve Outcome: Progressing Goal: Cardiovascular complication will be avoided Outcome: Progressing   Problem: Activity: Goal: Risk for activity intolerance will decrease Outcome: Progressing   Problem: Nutrition: Goal: Adequate nutrition will be maintained Outcome: Progressing   Problem: Coping: Goal: Level of anxiety will decrease Outcome: Progressing   Problem: Elimination: Goal: Will not experience complications related to urinary retention Outcome: Progressing   Problem: Pain Managment: Goal: General experience of comfort will improve and/or be controlled Outcome: Progressing   Problem: Safety: Goal: Ability to remain free from injury will improve Outcome: Progressing   Problem: Skin Integrity: Goal: Risk for impaired skin integrity will decrease Outcome: Progressing

## 2024-03-14 NOTE — Telephone Encounter (Signed)
 Sure Pl put her on with me RG

## 2024-03-14 NOTE — Plan of Care (Signed)

## 2024-03-14 NOTE — Progress Notes (Signed)
 PROGRESS NOTE    [name]  WUJ:[id number] DOB: 04-27-1956 DOA: 03/11/2024 PCP: Patient, No Pcp Per    Brief Narrative:  68 y.o. female with medical history significant for small cell lung cancer of right lung s/p chemoradiation, COPD, HTN, HLD, pancytopenia, history of CVA, radiation-induced esophageal stricture s/p dilation who presented to the ED for evaluation of shortness of breath.   CTA chest negative for PE. Scattered groundglass opacity in the lungs bilaterally are increased from prior exam, possible pneumonitis versus edema. Stable scattered pulmonary nodules measuring up to 4 mm noted. Soft tissue mass/lymph node at the right hilum measuring 1.5 x 1.1 cm is increased in size from most recent exam. Small right pleural effusion and trace left pleural effusion with atelectasis noted.       Assessment and Plan: Acute hypoxemic respiratory failure - Secondary to unresolved pneumonia versus CHF - Patient recently treated for pneumonia as outpatient - CT chest negative for PE but showed pneumonitis versus edema - Given IV Lasix  with good urine output-will dose again if blood pressure would allow - Continue empiric Rocephin  and Zithromax  -  procalcitonin is 0.37, follow blood cultures are no growth today -Wean O2 to room air as able   Pancytopenia - Chronic -Improving - Getting chemotherapy as outpatient -Continue to trend     COPD No active wheezing and does not seem to be in acute exacerbation at time of admission.   Continue albuterol  nebulizer as needed.   History of small cell lung cancer - Followed by oncology as outpatient - Dr. Marguerita Shih will see patient today   Goals of care - Palliative care consulted for goals of care discussion- ?  They are suggesting palliative care follow-up at SNF   PT eval and transfer to floor  DVT prophylaxis: SCDs Start: 03/11/24 2100    Code Status: Full Code Family Communication: None at bedside  Disposition Plan:   Level of care: Telemetry Status is: Inpatient     Consultants:  Oncology  Subjective: Does not wear oxygen  at home  Objective: Vitals:   03/14/24 0300 03/14/24 0400 03/14/24 0500 03/14/24 0800  BP: 119/65 107/60 (!) 108/59   Pulse: 71 73 71   Resp: 13 12 13    Temp:  98 F (36.7 C)  98 F (36.7 C)  TempSrc:  Oral  Oral  SpO2: 98% 99% 95%   Weight:      Height:        Intake/Output Summary (Last 24 hours) at 03/14/2024 1150 Last data filed at 03/14/2024 0550 Gross per 24 hour  Intake 960.18 ml  Output 500 ml  Net 460.18 ml   Filed Weights   03/11/24 1226 03/12/24 0500  Weight: 36.7 kg 41.6 kg    Examination:   General: Appearance:    Thin female in no acute distress     Lungs:     respirations unlabored  Heart:    Normal heart rate.    MS:   All extremities are intact.    Neurologic:   Awake, alert, oriented x 3. No apparent focal neurological           defect.        Data Reviewed: I have personally reviewed following labs and imaging studies  CBC: Recent Labs  Lab 03/11/24 1230 03/12/24 0542 03/13/24 0829 03/14/24 1111  WBC 3.9* 1.3* 2.8* 4.1  NEUTROABS  --   --  1.9 2.7  HGB 9.2* 7.7* 7.6* 8.1*  HCT 27.2* 23.1* 23.7* 24.0*  MCV 111.9* 114.4* 116.2* 115.9*  PLT 50* 37* 39* 49*   Basic Metabolic Panel: Recent Labs  Lab 03/11/24 1230 03/11/24 1819 03/12/24 0542 03/14/24 1111  NA 137  --  133* 136  K 4.1  --  3.8 4.6  CL 104  --  101 100  CO2 23  --  23 31  GLUCOSE 87  --  116* 82  BUN 17  --  16 7*  CREATININE 0.61  --  0.55 0.38*  CALCIUM 8.0*  --  7.2* 8.1*  MG  --  3.3* 2.2  --    GFR: Estimated Creatinine Clearance: 46 mL/min (A) (by C-G formula based on SCr of 0.38 mg/dL (L)). Liver Function Tests: Recent Labs  Lab 03/11/24 1230 03/14/24 1111  AST 14* 27  ALT 9 13  ALKPHOS 154* 108  BILITOT 0.2 0.2  PROT 5.4* 4.9*  ALBUMIN 2.5* 2.1*   No results for input(s): "LIPASE", "AMYLASE" in the last 168 hours. Recent Labs   Lab 03/11/24 2224  AMMONIA 14   Coagulation Profile: No results for input(s): "INR", "PROTIME" in the last 168 hours. Cardiac Enzymes: No results for input(s): "CKTOTAL", "CKMB", "CKMBINDEX", "TROPONINI" in the last 168 hours. BNP (last 3 results) No results for input(s): "PROBNP" in the last 8760 hours. HbA1C: No results for input(s): "HGBA1C" in the last 72 hours. CBG: Recent Labs  Lab 03/11/24 1223  GLUCAP 84   Lipid Profile: No results for input(s): "CHOL", "HDL", "LDLCALC", "TRIG", "CHOLHDL", "LDLDIRECT" in the last 72 hours. Thyroid  Function Tests: Recent Labs    03/11/24 1819  TSH 4.742*   Anemia Panel: No results for input(s): "VITAMINB12", "FOLATE", "FERRITIN", "TIBC", "IRON ", "RETICCTPCT" in the last 72 hours. Sepsis Labs: Recent Labs  Lab 03/11/24 1250 03/11/24 2224 03/12/24 1257  PROCALCITON  --   --  0.37  LATICACIDVEN 0.8 1.3  --     Recent Results (from the past 240 hours)  Resp panel by RT-PCR (RSV, Flu A&B, Covid) Anterior Nasal Swab     Status: None   Collection Time: 03/11/24 12:28 PM   Specimen: Anterior Nasal Swab  Result Value Ref Range Status   SARS Coronavirus 2 by RT PCR NEGATIVE NEGATIVE Final    Comment: (NOTE) SARS-CoV-2 target nucleic acids are NOT DETECTED.  The SARS-CoV-2 RNA is generally detectable in upper respiratory specimens during the acute phase of infection. The lowest concentration of SARS-CoV-2 viral copies this assay can detect is 138 copies/mL. A negative result does not preclude SARS-Cov-2 infection and should not be used as the sole basis for treatment or other patient management decisions. A negative result may occur with  improper specimen collection/handling, submission of specimen other than nasopharyngeal swab, presence of viral mutation(s) within the areas targeted by this assay, and inadequate number of viral copies(<138 copies/mL). A negative result must be combined with clinical observations, patient  history, and epidemiological information. The expected result is Negative.  Fact Sheet for Patients:  BloggerCourse.com  Fact Sheet for Healthcare Providers:  SeriousBroker.it  This test is no t yet approved or cleared by the [place]  FDA and  has been authorized for detection and/or diagnosis of SARS-CoV-2 by FDA under an Emergency Use Authorization (EUA). This EUA will remain  in effect (meaning this test can be used) for the duration of the COVID-19 declaration under Section 564(b)(1) of the Act, 21 U.S.C.section 360bbb-3(b)(1), unless the authorization is terminated  or revoked sooner.       Influenza A by PCR  NEGATIVE NEGATIVE Final   Influenza B by PCR NEGATIVE NEGATIVE Final    Comment: (NOTE) The Xpert Xpress SARS-CoV-2/FLU/RSV plus assay is intended as an aid in the diagnosis of influenza from Nasopharyngeal swab specimens and should not be used as a sole basis for treatment. Nasal washings and aspirates are unacceptable for Xpert Xpress SARS-CoV-2/FLU/RSV testing.  Fact Sheet for Patients: BloggerCourse.com  Fact Sheet for Healthcare Providers: SeriousBroker.it  This test is not yet approved or cleared by the [place]  FDA and has been authorized for detection and/or diagnosis of SARS-CoV-2 by FDA under an Emergency Use Authorization (EUA). This EUA will remain in effect (meaning this test can be used) for the duration of the COVID-19 declaration under Section 564(b)(1) of the Act, 21 U.S.C. section 360bbb-3(b)(1), unless the authorization is terminated or revoked.     Resp Syncytial Virus by PCR NEGATIVE NEGATIVE Final    Comment: (NOTE) Fact Sheet for Patients: BloggerCourse.com  Fact Sheet for Healthcare Providers: SeriousBroker.it  This test is not yet approved or cleared by the [place]  FDA  and has been authorized for detection and/or diagnosis of SARS-CoV-2 by FDA under an Emergency Use Authorization (EUA). This EUA will remain in effect (meaning this test can be used) for the duration of the COVID-19 declaration under Section 564(b)(1) of the Act, 21 U.S.C. section 360bbb-3(b)(1), unless the authorization is terminated or revoked.  Performed at [facility], [address] [place], [place] [address]   Blood culture (routine x 2)     Status: None (Preliminary result)   Collection Time: 03/11/24 12:45 PM   Specimen: BLOOD  Result Value Ref Range Status   Specimen Description   Final    BLOOD BLOOD RIGHT ARM Performed at [facility], [address] [place], [place] [address]    Special Requests   Final    BOTTLES DRAWN AEROBIC AND ANAEROBIC Blood Culture results may not be optimal due to an inadequate volume of blood received in culture bottles Performed at [facility], [address], [place], [place] [address]    Culture   Final    NO GROWTH 3 DAYS Performed at [facility], [address] [place], [place] [address]    Report Status PENDING  Incomplete  Blood culture (routine x 2)     Status: None (Preliminary result)   Collection Time: 03/11/24 12:50 PM   Specimen: BLOOD  Result Value Ref Range Status   Specimen Description   Final    BLOOD BLOOD LEFT ARM Performed at [facility], [address] [place], [place] [address]    Special Requests   Final    BOTTLES DRAWN AEROBIC AND ANAEROBIC Blood Culture results may not be optimal due to an inadequate volume of blood received in culture bottles Performed at [facility], [address], [place], [place] [address]    Culture   Final    NO GROWTH 3 DAYS Performed at [facility], [address], [place], [place] [address]    Report Status PENDING  Incomplete  MRSA Next Gen by PCR, Nasal     Status:  None   Collection Time: 03/11/24 10:11 PM   Specimen: Nasal Mucosa; Nasal Swab  Result Value Ref Range Status   MRSA by PCR Next Gen NOT DETECTED NOT DETECTED Final    Comment: (NOTE) The GeneXpert MRSA Assay (FDA approved for NASAL specimens only), is one component of a comprehensive  MRSA colonization surveillance program. It is not intended to diagnose MRSA infection nor to guide or monitor treatment for MRSA infections. Test performance is not FDA approved in patients less than 25 years old. Performed at [facility], [address], [place], [place] [address]          Radiology Studies: No results found.      Scheduled Meds:  Chlorhexidine  Gluconate Cloth  6 each Topical Daily   feeding supplement  237 mL Oral BID BM   mouth rinse  15 mL Mouth Rinse 4 times per day   pantoprazole   80 mg Oral BID AC   Continuous Infusions:  azithromycin  Stopped (03/13/24 2135)   cefTRIAXone  (ROCEPHIN )  IV Stopped (03/14/24 0327)     LOS: 3 days    Time spent: 45 minutes spent on chart review, discussion with nursing staff, consultants, updating family and interview/physical exam; more than 50% of that time was spent in counseling and/or coordination of care.    Enrigue Harvard, DO [facility] Available via Epic secure chat 7am-7pm After these hours, please refer to coverage provider listed on [email or web address] 03/14/2024, 11:50 AM

## 2024-03-15 DIAGNOSIS — R410 Disorientation, unspecified: Secondary | ICD-10-CM | POA: Diagnosis not present

## 2024-03-15 DIAGNOSIS — R627 Adult failure to thrive: Secondary | ICD-10-CM | POA: Diagnosis not present

## 2024-03-15 DIAGNOSIS — R600 Localized edema: Secondary | ICD-10-CM

## 2024-03-15 LAB — BASIC METABOLIC PANEL WITH GFR
Anion gap: 8 (ref 5–15)
BUN: 5 mg/dL — ABNORMAL LOW (ref 8–23)
CO2: 30 mmol/L (ref 22–32)
Calcium: 8.2 mg/dL — ABNORMAL LOW (ref 8.9–10.3)
Chloride: 96 mmol/L — ABNORMAL LOW (ref 98–111)
Creatinine, Ser: 0.47 mg/dL (ref 0.44–1.00)
GFR, Estimated: >60 mL/min (ref >60)
Glucose, Bld: 84 mg/dL (ref 70–99)
Potassium: 4.8 mmol/L (ref 3.5–5.1)
Sodium: 134 mmol/L — ABNORMAL LOW (ref 135–145)

## 2024-03-15 LAB — CBC
HCT: 24.1 % — ABNORMAL LOW (ref 36.0–46.0)
Hemoglobin: 7.9 g/dL — ABNORMAL LOW (ref 12.0–15.0)
MCH: 38 pg — ABNORMAL HIGH (ref 26.0–34.0)
MCHC: 32.8 g/dL (ref 30.0–36.0)
MCV: 115.9 fL — ABNORMAL HIGH (ref 80.0–100.0)
Platelets: 48 10*3/uL — ABNORMAL LOW (ref 150–400)
RBC: 2.08 MIL/uL — ABNORMAL LOW (ref 3.87–5.11)
RDW: 17.9 % — ABNORMAL HIGH (ref 11.5–15.5)
WBC: 3.7 10*3/uL — ABNORMAL LOW (ref 4.0–10.5)
nRBC: 0.8 % — ABNORMAL HIGH (ref 0.0–0.2)

## 2024-03-15 MED ORDER — ENSURE ENLIVE PO LIQD: 237.0000 mL | Freq: Two times a day (BID) | ORAL | Status: DC

## 2024-03-15 MED ORDER — AZITHROMYCIN 250 MG PO TABS
500.0000 mg | ORAL_TABLET | Freq: Every day | ORAL | Status: AC
  Administered 2024-03-15: 500 mg via ORAL
  Filled 2024-03-15: qty 2

## 2024-03-15 NOTE — Telephone Encounter (Signed)
 Spoke with pt and she is aware of recommendations per Dr. Marina Goodell.

## 2024-03-15 NOTE — Discharge Summary (Signed)
 Physician Discharge Summary  [name] VZD:[id number] DOB: 19-Mar-1956 DOA: 03/11/2024   Admit date: 03/11/2024 Discharge date: 03/15/2024  Admitted From: home Discharge disposition: home   Recommendations for Outpatient Follow-Up:   Home health Per oncology: We will see her back for follow-up visit after her repeat imaging studies in August 2025.  Outpatient palliative care Cbc 1 week   Discharge Diagnosis:   Active Problems:   COPD (chronic obstructive pulmonary disease) (HCC)   Pancytopenia (HCC)   Hypotension   Dyspnea on exertion    Discharge Condition: Improved.  Diet recommendation: Low sodium, heart healthy.  Wound care: None.  Code status: Full.   History of Present Illness:   [name] is a 68 y.o. female with medical history significant for small cell lung cancer of right lung s/p chemoradiation, COPD, HTN, HLD, pancytopenia, history of CVA, radiation-induced esophageal stricture s/p dilation who presented to the ED for evaluation of shortness of breath.   Patient states that over the last 2 weeks she has had progressive exertional dyspnea.  She has had cough occasionally productive of yellow/green sputum.  She has seen some swelling around her ankles.  She reports nausea but no emesis.  She has had some loose stools this morning.  She says that she has had increased urine output since receiving Lasix  while in the ED.   She says she was recently diagnosed with pneumonia which is being treated with an antibiotic.  She says her dyspnea significantly worsened today which is why she came to the hospital.   ED Course  Labs/Imaging on admission: I have personally reviewed following labs and imaging studies.   Initial vitals showed BP 112/51, pulse 96, RR 18, temp 98.3 F, SpO2 93% on room air.  While in the ED patient has been hypotensive with BP 80-90s/40s.   Labs show WBC 3.9, hemoglobin 9.2, platelets 50, sodium 137, potassium 4.1, bicarb  23, BUN 17, creatinine 0.61, serum glucose 87, lactic acid 0.8, BNP 223.3, magnesium  3.3, TSH 4.742.   Blood cultures in process.  UA negative for UTI.  COVID, influenza, RSV PCR negative.   CTA chest negative for PE.  Scattered groundglass opacity in the lungs bilaterally are increased from prior exam, possible pneumonitis versus edema.  Stable scattered pulmonary nodules measuring up to 4 mm noted.  Soft tissue mass/lymph node at the right hilum measuring 1.5 x 1.1 cm is increased in size from most recent exam.  Small right pleural effusion and trace left pleural effusion with atelectasis noted.   CT head without contrast negative for acute intracranial process.   Patient was given IV Solu-Medrol  125 mg, IV cefepime, IV magnesium  2 g, IV Lasix  20 mg, IV Protonix  40 mg, DuoNeb x 2.  EDP discussed with PCCM who recommended monitor UOP with IV Lasix .  If blood pressure drops give fluids back.  If no urine output consider redosing Lasix  tonight.  They are available if needed.  The hospitalist service was consulted to admit.   Hospital Course by Problem:   Acute hypoxemic respiratory failure - Secondary to unresolved pneumonia versus CHF - Patient recently treated for pneumonia as outpatient - CT chest negative for PE but showed pneumonitis versus edema - Given IV Lasix  with good urine output- no further sign of volume overload - treated with abx -  procalcitonin is 0.37, follow blood cultures are no growth today -weaned to room air   Pancytopenia - Chronic -Improving - Getting chemotherapy as outpatient -  Continue to trend     COPD No active wheezing and does not seem to be in acute exacerbation at time of admission.   Continue albuterol  nebulizer as needed.   History of small cell lung cancer - Followed by oncology as outpatient - Dr. Marguerita Shih will see patient today   Goals of care - Palliative care consulted for goals of care discussion- ?  They are suggesting palliative care  follow-up outpatient     Medical Consultants:   oncology   Discharge Exam:   Vitals:   03/14/24 2130 03/15/24 0433  BP: 118/64 115/73  Pulse: 71 84  Resp: 16 16  Temp: 98.1 F (36.7 C) 98.1 F (36.7 C)  SpO2: 94% 93%   Vitals:   03/14/24 1739 03/14/24 1918 03/14/24 2130 03/15/24 0433  BP: 112/65  118/64 115/73  Pulse: 78  71 84  Resp: 16  16 16   Temp: 98.5 F (36.9 C)  98.1 F (36.7 C) 98.1 F (36.7 C)  TempSrc: Oral  Oral Oral  SpO2:  96% 94% 93%  Weight:      Height:        General exam: Appears calm and comfortable.   The results of significant diagnostics from this hospitalization (including imaging, microbiology, ancillary and laboratory) are listed below for reference.     Procedures and Diagnostic Studies:   ECHOCARDIOGRAM COMPLETE Result Date: 03/12/2024    ECHOCARDIOGRAM REPORT   Patient Name:   [name] Date of Exam: 03/12/2024 Medical Rec #:  [id number]       Height:       61.0 in Accession #:    [id number]      Weight:       91.7 lb Date of Birth:  1956-02-12       BSA:          1.356 m Patient Age:    67 years        BP:           115/60 mmHg Patient Gender: F               HR:           73 bpm. Exam Location:  Inpatient Procedure: 2D Echo, Cardiac Doppler and Color Doppler (Both Spectral and Color            Flow Doppler were utilized during procedure). Indications:    Dyspnea R06.00  History:        Patient has prior history of Echocardiogram examinations, most                 recent 09/17/2017.  Sonographer:    Hersey Lorenzo RDCS Referring Phys: [id number] VISHAL R PATEL IMPRESSIONS  1. Left ventricular ejection fraction, by estimation, is 55%. The left ventricle has normal function. The left ventricle has no regional wall motion abnormalities. Left ventricular diastolic parameters were normal.  2. Right ventricular systolic function is normal. The right ventricular size is normal.  3. Right atrial size was mildly dilated.  4. The mitral valve is abnormal.  Trivial mitral valve regurgitation. No evidence of mitral stenosis.  5. The aortic valve is tricuspid. There is moderate calcification of the aortic valve. There is moderate thickening of the aortic valve. Aortic valve regurgitation is not visualized. Aortic valve sclerosis/calcification is present, without any evidence of aortic stenosis.  6. The inferior vena cava is normal in size with <50% respiratory variability, suggesting right atrial pressure of 8 mmHg. FINDINGS  Left Ventricle:  Left ventricular ejection fraction, by estimation, is 55%. The left ventricle has normal function. The left ventricle has no regional wall motion abnormalities. Strain was performed and the global longitudinal strain is indeterminate. The left ventricular internal cavity size was normal in size. There is no left ventricular hypertrophy. Left ventricular diastolic parameters were normal. Right Ventricle: The right ventricular size is normal. No increase in right ventricular wall thickness. Right ventricular systolic function is normal. Left Atrium: Left atrial size was normal in size. Right Atrium: Right atrial size was mildly dilated. Pericardium: There is no evidence of pericardial effusion. Mitral Valve: The mitral valve is abnormal. There is mild thickening of the mitral valve leaflet(s). There is mild calcification of the mitral valve leaflet(s). Trivial mitral valve regurgitation. No evidence of mitral valve stenosis. Tricuspid Valve: The tricuspid valve is normal in structure. Tricuspid valve regurgitation is mild . No evidence of tricuspid stenosis. Aortic Valve: The aortic valve is tricuspid. There is moderate calcification of the aortic valve. There is moderate thickening of the aortic valve. Aortic valve regurgitation is not visualized. Aortic valve sclerosis/calcification is present, without any  evidence of aortic stenosis. Pulmonic Valve: The pulmonic valve was normal in structure. Pulmonic valve regurgitation is not  visualized. No evidence of pulmonic stenosis. Aorta: The aortic root is normal in size and structure. Venous: The inferior vena cava is normal in size with less than 50% respiratory variability, suggesting right atrial pressure of 8 mmHg. IAS/Shunts: No atrial level shunt detected by color flow Doppler. Additional Comments: 3D was performed not requiring image post processing on an independent workstation and was indeterminate.  LEFT VENTRICLE PLAX 2D LVIDd:         4.20 cm   Diastology LVIDs:         3.00 cm   LV e' medial:    9.46 cm/s LV PW:         0.80 cm   LV E/e' medial:  7.7 LV IVS:        0.70 cm   LV e' lateral:   8.49 cm/s LVOT diam:     1.90 cm   LV E/e' lateral: 8.6 LV SV:         45 LV SV Index:   33 LVOT Area:     2.84 cm  RIGHT VENTRICLE             IVC RV S prime:     12.20 cm/s  IVC diam: 2.00 cm TAPSE (M-mode): 1.7 cm LEFT ATRIUM         Index LA diam:    3.00 cm 2.21 cm/m  AORTIC VALVE LVOT Vmax:   73.10 cm/s LVOT Vmean:  49.300 cm/s LVOT VTI:    0.160 m  AORTA Ao Root diam: 2.80 cm Ao Asc diam:  2.90 cm MITRAL VALVE               TRICUSPID VALVE MV Area (PHT): 2.67 cm    TR Peak grad:   21.0 mmHg MV Decel Time: 284 msec    TR Vmax:        229.00 cm/s MV E velocity: 73.30 cm/s MV A velocity: 63.00 cm/s  SHUNTS MV E/A ratio:  1.16        Systemic VTI:  0.16 m                            Systemic Diam: 1.90 cm Janelle Mediate MD Electronically signed  by Janelle Mediate MD Signature Date/Time: 03/12/2024/10:29:48 AM    Final    CT Head Wo Contrast Result Date: 03/11/2024 CLINICAL DATA:  Altered level of consciousness, confusion, malaise EXAM: CT HEAD WITHOUT CONTRAST TECHNIQUE: Contiguous axial images were obtained from the base of the skull through the vertex without intravenous contrast. RADIATION DOSE REDUCTION: This exam was performed according to the departmental dose-optimization program which includes automated exposure control, adjustment of the mA and/or kV according to patient size and/or  use of iterative reconstruction technique. COMPARISON:  09/16/2017 FINDINGS: Brain: No acute infarct or hemorrhage. The lateral ventricles and midline structures are unremarkable. No acute extra-axial fluid collections. No mass effect. Vascular: No hyperdense vessel or unexpected calcification. Skull: Postsurgical changes from prior left occipital craniotomy. No acute bony abnormalities. Sinuses/Orbits: Trace left mastoid effusion. Paranasal sinuses are clear. Other: None. IMPRESSION: 1. Stable head CT, no acute intracranial process. Electronically Signed   By: Bobbye Burrow M.D.   On: 03/11/2024 20:02   CT Angio Chest PE W and/or Wo Contrast Result Date: 03/11/2024 CLINICAL DATA:  Pulmonary embolism suspected, high probability. Pulmonary embolism versus pneumonia. Dyspnea with activity, confusion, and malaise. EXAM: CT ANGIOGRAPHY CHEST WITH CONTRAST TECHNIQUE: Multidetector CT imaging of the chest was performed using the standard protocol during bolus administration of intravenous contrast. Multiplanar CT image reconstructions and MIPs were obtained to evaluate the vascular anatomy. RADIATION DOSE REDUCTION: This exam was performed according to the departmental dose-optimization program which includes automated exposure control, adjustment of the mA and/or kV according to patient size and/or use of iterative reconstruction technique. CONTRAST:  75mL OMNIPAQUE  IOHEXOL  350 MG/ML SOLN COMPARISON:  02/13/2024, 11/14/2023. FINDINGS: Cardiovascular: The heart is normal in size and there is a trace pericardial effusion. Scattered coronary artery calcifications are noted. There is atherosclerotic calcification of the aorta without evidence of aneurysm. The pulmonary trunk is normal in caliber. No evidence of pulmonary embolism is seen. Mediastinum/Nodes: No mediastinal lymphadenopathy is seen. There is a prominent soft tissue mass at the right hilum measuring 1.5 x 1.1 cm, increased in size from the prior exam. Soft  tissue edema is noted in the mediastinum and unchanged from the prior exam. The thyroid  gland, trachea, and esophagus are within normal limits. Lungs/Pleura: There is a small right pleural effusion and trace left pleural effusion. Centrilobular emphysematous changes are noted in the lungs. Scattered ground-glass opacities are noted in the lungs and increased from the prior exam with a right upper lobe predominance. Compressive atelectasis is present in the right lower lobe. There is stable scattered pulmonary nodules measuring up to 4 mm in the right middle lobe. Upper Abdomen: No acute abnormality. Musculoskeletal: There are stable compression deformities in the superior endplate at L2 and T4. No acute osseous abnormality is seen. Review of the MIP images confirms the above findings. IMPRESSION: 1. No evidence of pulmonary embolism. 2. Scattered ground-glass opacities in the lungs bilaterally, increased from the prior exam, possible pneumonitis versus edema. 3. Stable scattered pulmonary nodules measuring up to 4 mm. 4. Soft tissue mass/lymph node at the right hilum measuring 1.5 x 1.1 cm, increased in size from most recent exam. 5. Small right pleural effusion trace left pleural effusion with associated atelectasis. 6. Coronary artery calcifications. 7. Aortic atherosclerosis. Electronically Signed   By: Wyvonnia Heimlich M.D.   On: 03/11/2024 15:52   DG Chest Portable 1 View Result Date: 03/11/2024 CLINICAL DATA:  Shortness of breath EXAM: PORTABLE CHEST 1 VIEW COMPARISON:  X-ray 03/09/2024 FINDINGS: Hyperinflation. Normal cardiopericardial  silhouette. Calcified aorta. Tiny right pleural effusion again seen. The hazy ill-defined right upper lung opacity is slightly increased today. Recommend follow-up. No pneumothorax or edema. Old left-sided rib fractures. IMPRESSION: Slight increase in right upper perihilar lung opacity. Tiny right effusion. Hyperinflation.  Recommend continued follow-up. Electronically Signed    By: Adrianna Horde M.D.   On: 03/11/2024 13:23     Labs:   Basic Metabolic Panel: Recent Labs  Lab 03/11/24 1230 03/11/24 1819 03/12/24 0542 03/14/24 1111 03/15/24 0548  NA 137  --  133* 136 134*  K 4.1  --  3.8 4.6 4.8  CL 104  --  101 100 96*  CO2 23  --  23 31 30   GLUCOSE 87  --  116* 82 84  BUN 17  --  16 7* 5*  CREATININE 0.61  --  0.55 0.38* 0.47  CALCIUM 8.0*  --  7.2* 8.1* 8.2*  MG  --  3.3* 2.2  --   --    GFR Estimated Creatinine Clearance: 46 mL/min (by C-G formula based on SCr of 0.47 mg/dL). Liver Function Tests: Recent Labs  Lab 03/11/24 1230 03/14/24 1111  AST 14* 27  ALT 9 13  ALKPHOS 154* 108  BILITOT 0.2 0.2  PROT 5.4* 4.9*  ALBUMIN 2.5* 2.1*   No results for input(s): "LIPASE", "AMYLASE" in the last 168 hours. Recent Labs  Lab 03/11/24 2224  AMMONIA 14   Coagulation profile No results for input(s): "INR", "PROTIME" in the last 168 hours.  CBC: Recent Labs  Lab 03/11/24 1230 03/12/24 0542 03/13/24 0829 03/14/24 1111 03/15/24 0548  WBC 3.9* 1.3* 2.8* 4.1 3.7*  NEUTROABS  --   --  1.9 2.7  --   HGB 9.2* 7.7* 7.6* 8.1* 7.9*  HCT 27.2* 23.1* 23.7* 24.0* 24.1*  MCV 111.9* 114.4* 116.2* 115.9* 115.9*  PLT 50* 37* 39* 49* 48*   Cardiac Enzymes: No results for input(s): "CKTOTAL", "CKMB", "CKMBINDEX", "TROPONINI" in the last 168 hours. BNP: Invalid input(s): "POCBNP" CBG: Recent Labs  Lab 03/11/24 1223  GLUCAP 84   D-Dimer No results for input(s): "DDIMER" in the last 72 hours. Hgb A1c No results for input(s): "HGBA1C" in the last 72 hours. Lipid Profile No results for input(s): "CHOL", "HDL", "LDLCALC", "TRIG", "CHOLHDL", "LDLDIRECT" in the last 72 hours. Thyroid  function studies No results for input(s): "TSH", "T4TOTAL", "T3FREE", "THYROIDAB" in the last 72 hours.  Invalid input(s): "FREET3" Anemia work up No results for input(s): "VITAMINB12", "FOLATE", "FERRITIN", "TIBC", "IRON ", "RETICCTPCT" in the last 72  hours. Microbiology Recent Results (from the past 240 hours)  Resp panel by RT-PCR (RSV, Flu A&B, Covid) Anterior Nasal Swab     Status: None   Collection Time: 03/11/24 12:28 PM   Specimen: Anterior Nasal Swab  Result Value Ref Range Status   SARS Coronavirus 2 by RT PCR NEGATIVE NEGATIVE Final    Comment: (NOTE) SARS-CoV-2 target nucleic acids are NOT DETECTED.  The SARS-CoV-2 RNA is generally detectable in upper respiratory specimens during the acute phase of infection. The lowest concentration of SARS-CoV-2 viral copies this assay can detect is 138 copies/mL. A negative result does not preclude SARS-Cov-2 infection and should not be used as the sole basis for treatment or other patient management decisions. A negative result may occur with  improper specimen collection/handling, submission of specimen other than nasopharyngeal swab, presence of viral mutation(s) within the areas targeted by this assay, and inadequate number of viral copies(<138 copies/mL). A negative result must be combined  with clinical observations, patient history, and epidemiological information. The expected result is Negative.  Fact Sheet for Patients:  BloggerCourse.com  Fact Sheet for Healthcare Providers:  SeriousBroker.it  This test is no t yet approved or cleared by the [place]  FDA and  has been authorized for detection and/or diagnosis of SARS-CoV-2 by FDA under an Emergency Use Authorization (EUA). This EUA will remain  in effect (meaning this test can be used) for the duration of the COVID-19 declaration under Section 564(b)(1) of the Act, 21 U.S.C.section 360bbb-3(b)(1), unless the authorization is terminated  or revoked sooner.       Influenza A by PCR NEGATIVE NEGATIVE Final   Influenza B by PCR NEGATIVE NEGATIVE Final    Comment: (NOTE) The Xpert Xpress SARS-CoV-2/FLU/RSV plus assay is intended as an aid in the diagnosis of  influenza from Nasopharyngeal swab specimens and should not be used as a sole basis for treatment. Nasal washings and aspirates are unacceptable for Xpert Xpress SARS-CoV-2/FLU/RSV testing.  Fact Sheet for Patients: BloggerCourse.com  Fact Sheet for Healthcare Providers: SeriousBroker.it  This test is not yet approved or cleared by the [place]  FDA and has been authorized for detection and/or diagnosis of SARS-CoV-2 by FDA under an Emergency Use Authorization (EUA). This EUA will remain in effect (meaning this test can be used) for the duration of the COVID-19 declaration under Section 564(b)(1) of the Act, 21 U.S.C. section 360bbb-3(b)(1), unless the authorization is terminated or revoked.     Resp Syncytial Virus by PCR NEGATIVE NEGATIVE Final    Comment: (NOTE) Fact Sheet for Patients: BloggerCourse.com  Fact Sheet for Healthcare Providers: SeriousBroker.it  This test is not yet approved or cleared by the [place]  FDA and has been authorized for detection and/or diagnosis of SARS-CoV-2 by FDA under an Emergency Use Authorization (EUA). This EUA will remain in effect (meaning this test can be used) for the duration of the COVID-19 declaration under Section 564(b)(1) of the Act, 21 U.S.C. section 360bbb-3(b)(1), unless the authorization is terminated or revoked.  Performed at [facility], [address], [place], [place] [address]   Blood culture (routine x 2)     Status: None (Preliminary result)   Collection Time: 03/11/24 12:45 PM   Specimen: BLOOD  Result Value Ref Range Status   Specimen Description   Final    BLOOD BLOOD RIGHT ARM Performed at [facility], [address] [place], [place] [address]    Special Requests   Final    BOTTLES DRAWN AEROBIC AND ANAEROBIC Blood Culture results may not be optimal due to an  inadequate volume of blood received in culture bottles Performed at [facility], [address], [place], [place] [address]    Culture   Final    NO GROWTH 4 DAYS Performed at [facility], [address] [place], [place] [address]    Report Status PENDING  Incomplete  Blood culture (routine x 2)     Status: None (Preliminary result)   Collection Time: 03/11/24 12:50 PM   Specimen: BLOOD  Result Value Ref Range Status   Specimen Description   Final    BLOOD BLOOD LEFT ARM Performed at [facility], [address], [place], [place] [address]    Special Requests   Final    BOTTLES DRAWN AEROBIC AND ANAEROBIC Blood Culture results may not be optimal due to an inadequate volume of blood received in culture bottles Performed at [facility], [address] [place], [place] [address]    Culture   Final    NO GROWTH 4 DAYS Performed at [facility], [address] [place], [place] [address]    Report Status PENDING  Incomplete  MRSA Next Gen by PCR, Nasal     Status: None   Collection Time: 03/11/24 10:11 PM   Specimen: Nasal Mucosa; Nasal Swab  Result Value Ref Range Status   MRSA by PCR Next Gen NOT DETECTED NOT DETECTED Final    Comment: (NOTE) The GeneXpert MRSA Assay (FDA approved for NASAL specimens only), is one component of a comprehensive MRSA colonization surveillance program. It is not intended to diagnose MRSA infection nor to guide or monitor treatment for MRSA infections. Test performance is not FDA approved in patients less than 25 years old. Performed at [facility], [address] [place], [place] [address]      Discharge Instructions:   Discharge Instructions     Diet - low sodium heart healthy   Complete by: As directed    Discharge instructions   Complete by: As directed    Home health   Increase activity slowly   Complete by: As directed       Allergies  as of 03/15/2024       Reactions   Tape Other (See Comments)   SKIN IS VERY SENSITIVE!!!   Celebrex [celecoxib] Other (See Comments)   GI bleed   Decadron  [dexamethasone ] Other (See Comments)   Stomach issues, history of GI bleed   Penicillins Other (See Comments)   WELTS Has patient had a PCN reaction causing immediate rash, facial/tongue/throat swelling, SOB or lightheadedness with hypotension: Yes Has patient had a PCN reaction causing severe rash involving mucus membranes or skin necrosis: No Has patient had a PCN reaction that required hospitalization: No Has patient had a PCN reaction occurring within the last 10 years: Yes If all of the above answers are "NO", then may proceed with Cephalosporin use. REACTION: whelps   Crestor [rosuvastatin] Other (See Comments)   Made legs hurt   Doxycycline  Other (See Comments)   Stomach upset    Hydrocodone -acetaminophen  Hives, Itching, Rash   Pravastatin Other (See Comments)   Muscle aches        Medication List     STOP taking these medications    azithromycin  250 MG tablet Commonly known as: ZITHROMAX    ezetimibe  10 MG tablet Commonly known as: Zetia    fluticasone -salmeterol 250-50 MCG/ACT Aepb Commonly known as: Advair Diskus   sucralfate  1 g tablet Commonly known as: Carafate        TAKE these medications    albuterol  108 (90 Base) MCG/ACT inhaler Commonly known as: Ventolin  HFA 2 Inhalations 15 to 20  minutes apart every 4 hours to Rescue Asthma What changed:  how much to take how to take this when to take this reasons to take this additional instructions   albuterol  (2.5 MG/3ML) 0.083% nebulizer solution Commonly known as: PROVENTIL  Take 3 mLs (2.5 mg total) by nebulization every 6 (six) hours as needed for wheezing or shortness of breath. What changed: Another medication with the same name was changed. Make sure you understand how and when to take each.   Artificial Tears PF 0.1-0.3 % Soln Generic  drug: Dextran 70-Hypromellose (PF) Place 1 drop into both eyes 4 (four) times daily as needed (for dryness).   ascorbic acid 500 MG tablet Commonly known as: VITAMIN C Take 500 mg by  mouth daily.   feeding supplement Liqd Take 237 mLs by mouth 2 (two) times daily between meals.   ipratropium-albuterol  0.5-2.5 (3) MG/3ML Soln Commonly known as: DUONEB Take 3 mLs by nebulization every 4 (four) hours as needed. Max:6 doses per day What changed:  reasons to take this additional instructions   nystatin  100000 UNIT/ML suspension Commonly known as: MYCOSTATIN  Take 5 mLs (500,000 Units total) by mouth 4 (four) times daily.   omeprazole  40 MG capsule Commonly known as: PRILOSEC Take 1 capsule (40 mg total) by mouth 2 (two) times daily before a meal.   ondansetron  8 MG tablet Commonly known as: ZOFRAN  Take 1 tablet (8 mg total) by mouth every 8 (eight) hours as needed for nausea or vomiting. Starting 3 days after chemotherapy   Vitamin D3 125 MCG (5000 UT) Caps Take 5,000 Units by mouth daily.        Follow-up Information     Care, [facility] Follow up.   Why: Someone from [facility] will contact you once you discharge from the hospital to set up your Home Health Physical and Occupational Therapy services. Contact information: [address] [place] [address] [phone]         Marlene Simas, MD Follow up.   Specialty: Oncology Contact information: [address] [place] [address] [phone]                  Time coordinating discharge: 45 min  Signed:  Enrigue Harvard DO  [facility] 03/15/2024, 11:00 AM

## 2024-03-15 NOTE — Telephone Encounter (Signed)
 Called pt to see if the date 6/3 with Dr Venice Gillis would work for her to have EGD with dil. Pt states she is currently in the hospital with pneumonia. She does not know when she may go home. How far would you like to push out the procedure?

## 2024-03-15 NOTE — Progress Notes (Signed)
 Daily Progress Note   Patient Name: [name]       Date: 03/15/2024 DOB: 06/12/1956  Age: 68 y.o. MRN#: [id number] Attending Physician: Enrigue Harvard, DO Primary Care Physician: Patient, No Pcp Per Admit Date: 03/11/2024  Reason for Consultation/Follow-up: Establishing goals of care  Subjective: Awake alert sitting up in bed, no acute distress.  Believes that her her breathing has improved.  Length of Stay: 4  Current Medications: Scheduled Meds:   Chlorhexidine  Gluconate Cloth  6 each Topical Daily   feeding supplement  237 mL Oral BID BM   mouth rinse  15 mL Mouth Rinse 4 times per day   pantoprazole   80 mg Oral BID AC    Continuous Infusions:   PRN Meds: acetaminophen  (TYLENOL ) oral liquid 160 mg/5 mL **OR** acetaminophen , albuterol , magic mouthwash, ondansetron  **OR** ondansetron  (ZOFRAN ) IV, mouth rinse, senna-docusate  Physical Exam         Awake alert Sitting up in bed No acute distress Regular work of breathing  Vital Signs: BP 115/73 (BP Location: Right Arm)   Pulse 84   Temp 98.1 F (36.7 C) (Oral)   Resp 16   Ht 5\' 1"  (1.549 m)   Wt 41.6 kg   SpO2 93%   BMI 17.33 kg/m  SpO2: SpO2: 93 % O2 Device: O2 Device: Room Air O2 Flow Rate: O2 Flow Rate (L/min): 2 L/min  Intake/output summary:  Intake/Output Summary (Last 24 hours) at 03/15/2024 1235 Last data filed at 03/15/2024 1021 Gross per 24 hour  Intake 480 ml  Output 600 ml  Net -120 ml   LBM: Last BM Date : 03/14/24 Baseline Weight: Weight: 36.7 kg Most recent weight: Weight: 41.6 kg       Palliative Assessment/Data:      Patient Active Problem List   Diagnosis Date Noted   Hypotension 03/11/2024   Dyspnea on exertion 03/11/2024   Radiation-induced esophageal stricture 01/20/2024    Esophageal dysphagia 01/20/2024   Encounter for antineoplastic chemotherapy 10/31/2023   Thrombocytopenia (HCC) 10/24/2023   Chemotherapy induced neutropenia (HCC) 08/24/2023   Small cell lung cancer (HCC) 08/01/2023   Hilar mass 07/20/2023   Tobacco use 07/20/2023   Statin intolerance 09/18/2018   Thoracic aorta atherosclerosis (HCC) 11/30/2016   TIA (transient ischemic attack) 08/19/2016   Iron  deficiency anemia 07/20/2016  Pancytopenia (HCC) 06/25/2016   Protein-calorie malnutrition, moderate (HCC) 06/23/2016   Osteoporosis 01/02/2015   Depression, major, in remission (HCC) 01/02/2015   Carotid stenosis, asymptomatic 12/16/2014   HTN (hypertension) 10/03/2014   Medication management 10/03/2014   COPD (chronic obstructive pulmonary disease) (HCC)    Asthma-COPD overlap syndrome (HCC)    Vitamin D  deficiency    Mixed hyperlipidemia 04/30/2008   GERD 04/30/2008   Recurrent Upper GI bleed 04/30/2008    Palliative Care Assessment & Plan   Patient Profile:    Assessment: 68 year old lady with small cell lung cancer of right lung status post chemoradiation COPD hypertension dyslipidemia pancytopenia history of stroke history of radiation-induced esophageal stricture status post dilation Patient was admitted with shortness of breath, progressive exertional dyspnea and cough lower extremity swelling, she was hospitalized for pneumonia she was treated with antibiotics she was initially in stepdown unit and then transferred to medical floor she had a CT scan of the chest showing scattered groundglass opacity in both lungs and stable scattered pulmonary nodules.   Recommendations/Plan: Palliative care was consulted for goals of care discussions.  Patient elected for continuation of full CODE STATUS.  Goals of care discussions were undertaken with the patient as well as her daughter [name].  Also recommend a follow-up with palliative care at the cancer center when the patient comes  back to follow-up with her oncologist.  Goals of Care and Additional Recommendations: Limitations on Scope of Treatment: Full Scope Treatment  Code Status:    Code Status Orders  (From admission, onward)           Start     Ordered   03/11/24 2100  Full code  Continuous       Question:  By:  Answer:  Consent: discussion documented in EHR   03/11/24 2100           Code Status History     Date Active Date Inactive Code Status Order ID Comments User Context   09/16/2017 1641 09/23/2017 1834 Full Code [id number]  Evelyn Hire ED   08/18/2016 1904 08/20/2016 2132 Full Code [id number]  Wynetta Heckle, MD Inpatient   12/16/2014 1627 12/18/2014 1507 Full Code [id number]  Isaiah Marc, PA-C Inpatient       Prognosis:  Unable to determine  Discharge Planning: Home with Palliative Services  Care plan was discussed with patient  Thank you for allowing the Palliative Medicine Team to assist in the care of this patient.  Mod MDM     Greater than 50%  of this time was spent counseling and coordinating care related to the above assessment and plan.  Lujean Sake, MD  Please contact Palliative Medicine Team phone at [phone] for questions and concerns.

## 2024-03-15 NOTE — Evaluation (Signed)
 Occupational Therapy Evaluation Patient Details Name: [name] MRN: [id number] DOB: 1956/03/01 Today's Date: 03/15/2024   History of Present Illness   Pt is a 68 y.o. female admitted with acute hypoxemic respiratory failure, CTA neg for PE. PMH significant for small cell lung cancer of right lung s/p chemoradiation, COPD, HTN, HLD, pancytopenia, history of CVA, radiation-induced esophageal stricture s/p dilation     Clinical Impressions Prior to hospital admission, pt was independent and lives alone, family and friends assist with transportation and IADLs as needed. Pt currently requires distant supervision for t/f bathroom without AD and completes toileting hygiene + clothing management after void, functional mobility in hallway 300 ft with SpO2 93%> on RA.  Educated pt on energy conservation strategies, recommendations for DME/AE to support independence in ADLs with pt verbalizing understanding. Pt would benefit from skilled OT services to address noted impairments and functional limitations (see below for any additional details) in order to maximize safety and independence while minimizing falls risk and caregiver burden. Anticipate the need for follow up [facility] OT services upon acute hospital DC.      If plan is discharge home, recommend the following:   A little help with walking and/or transfers;A little help with bathing/dressing/bathroom;Assist for transportation;Assistance with cooking/housework     Functional Status Assessment   Patient has had a recent decline in their functional status and demonstrates the ability to make significant improvements in function in a reasonable and predictable amount of time.     Equipment Recommendations   Other (comment);Tub/shower seat (grab bar for shower)      Precautions/Restrictions   Precautions Precautions: Fall Restrictions Weight Bearing Restrictions Per Provider Order: No     Mobility Bed Mobility Overal bed  mobility: Independent                  Transfers Overall transfer level: Needs assistance Equipment used: None Transfers: Sit to/from Stand Sit to Stand: Supervision                  Balance Overall balance assessment: Mild deficits observed, not formally tested (one instance of mild sway while walking, easily self corrected)                                         ADL either performed or assessed with clinical judgement   ADL Overall ADL's : At baseline;Needs assistance/impaired     Grooming: Wash/dry hands;Standing;Supervision/safety                   Toilet Transfer: Supervision/safety;Grab Pharmacist, community and Hygiene: Supervision/safety;Sit to/from stand Toileting - Clothing Manipulation Details (indicate cue type and reason): void, distant supervision     Functional mobility during ADLs: Supervision/safety General ADL Comments: Pt has been performing ADLs independent - mod indepenent in room. Distant supervision for t/f bathroom without AD, use of GB in bathroom and standard toilet. Pt reports feeling very close to baseline with lingering fatigue/weakness and is hopeful to dc home today     Vision Baseline Vision/History: 6 Macular Degeneration;1 Wears glasses Patient Visual Report: No change from baseline              Pertinent Vitals/Pain Pain Assessment Pain Assessment: No/denies pain     Extremity/Trunk Assessment Upper Extremity Assessment Upper Extremity Assessment: Generalized weakness   Lower Extremity Assessment Lower Extremity Assessment: Defer to PT  evaluation       Communication Communication Communication: No apparent difficulties   Cognition Arousal: Alert Behavior During Therapy: WFL for tasks assessed/performed Cognition: No apparent impairments                               Following commands: Intact       Cueing  General Comments   Cueing  Techniques: Verbal cues  SpO2 93% > on RA while walking, pt denies SOB           Home Living Family/patient expects to be discharged to:: Private residence Living Arrangements: Alone Available Help at Discharge: Family;Friend(s);Available PRN/intermittently Type of Home: House Home Access: Stairs to enter [employer] of Steps: 2 Entrance Stairs-Rails: Right;Left Home Layout: One level     Bathroom Shower/Tub: Producer, television/film/video: Standard     Home Equipment: BSC/3in1;Grab bars - toilet          Prior Functioning/Environment Prior Level of Function : Independent/Modified Independent             Mobility Comments: no falls hx ADLs Comments: independent, family assist with transportation and IADLs    OT Problem List: Decreased strength;Decreased activity tolerance;Decreased knowledge of use of DME or AE;Impaired balance (sitting and/or standing)   OT Treatment/Interventions: Self-care/ADL training;Energy conservation;DME and/or AE instruction;Patient/family education;Balance training      OT Goals(Current goals can be found in the care plan section)   Acute Rehab OT Goals OT Goal Formulation: With patient Time For Goal Achievement: 03/29/24 Potential to Achieve Goals: Good   OT Frequency:  Min 2X/week       AM-PAC OT "6 Clicks" Daily Activity     Outcome Measure Help from another person eating meals?: None Help from another person taking care of personal grooming?: None Help from another person toileting, which includes using toliet, bedpan, or urinal?: A Little Help from another person bathing (including washing, rinsing, drying)?: A Little Help from another person to put on and taking off regular upper body clothing?: None Help from another person to put on and taking off regular lower body clothing?: A Little 6 Click Score: 21   End of Session Equipment Utilized During Treatment: Gait belt Nurse Communication: Mobility  status;Other (comment) (O2 level)  Activity Tolerance: Patient tolerated treatment well Patient left: in bed;with call bell/phone within reach  OT Visit Diagnosis: Other abnormalities of gait and mobility (R26.89);Muscle weakness (generalized) (M62.81)                Time: 9604-5409 OT Time Calculation (min): 16 min Charges:  OT General Charges $OT Visit: 1 Visit OT Evaluation $OT Eval Low Complexity: 1 Low Syria Kestner L. Jaysa Kise, OTR/L  03/15/24, 12:30 PM

## 2024-03-15 NOTE — Progress Notes (Signed)
 Plan for d/c later today after home O2 eval Home health orders placed. Anandi Abramo

## 2024-03-15 NOTE — Progress Notes (Signed)
 O2 93 or above while walking on RA, HR up to 119.

## 2024-03-15 NOTE — Progress Notes (Signed)
 Heart Failure Navigator Progress Note  Assessed for Heart & Vascular TOC clinic readiness.  Patient does not meet criteria due to EF 55 %, patient to be discharged with Palliative Care following. .   Navigator will sign off at this time.   Randie Bustle, BSN, Scientist, clinical (histocompatibility and immunogenetics) Only

## 2024-03-15 NOTE — Care Management Important Message (Signed)
 Important Message  Patient Details IM Letter given. Name: [name] MRN: [id number] Date of Birth: 1956-06-21   Important Message Given:  Yes - Medicare IM     Curtiss Dowdy 03/15/2024, 12:23 PM

## 2024-03-15 NOTE — Telephone Encounter (Signed)
 At some point after she has fully recovered from pneumonia. She needs to get the blessing for endoscopy from her primary physician or physicians who will see her after hospital discharge. She/they should reach out to us  in this regard.  When they do, please obtain the specific information and place it in her chart for my review.  We will go from there regarding scheduling. Thanks, Dr. Elvin Hammer

## 2024-03-15 NOTE — Plan of Care (Signed)
  Problem: Education: Goal: Knowledge of General Education information will improve Description: Including pain rating scale, medication(s)/side effects and non-pharmacologic comfort measures Outcome: Completed/Met   Problem: Health Behavior/Discharge Planning: Goal: Ability to manage health-related needs will improve Outcome: Completed/Met   Problem: Clinical Measurements: Goal: Ability to maintain clinical measurements within normal limits will improve Outcome: Completed/Met Goal: Will remain free from infection Outcome: Completed/Met Goal: Diagnostic test results will improve Outcome: Completed/Met Goal: Respiratory complications will improve Outcome: Completed/Met Goal: Cardiovascular complication will be avoided Outcome: Completed/Met   Problem: Activity: Goal: Risk for activity intolerance will decrease Outcome: Completed/Met   Problem: Nutrition: Goal: Adequate nutrition will be maintained Outcome: Completed/Met   Problem: Coping: Goal: Level of anxiety will decrease Outcome: Completed/Met   Problem: Elimination: Goal: Will not experience complications related to bowel motility Outcome: Completed/Met Goal: Will not experience complications related to urinary retention Outcome: Completed/Met   Problem: Pain Managment: Goal: General experience of comfort will improve and/or be controlled Outcome: Completed/Met   Problem: Safety: Goal: Ability to remain free from injury will improve Outcome: Completed/Met   Problem: Skin Integrity: Goal: Risk for impaired skin integrity will decrease Outcome: Completed/Met   Problem: Education: Goal: Ability to demonstrate management of disease process will improve Outcome: Completed/Met Goal: Ability to verbalize understanding of medication therapies will improve Outcome: Completed/Met Goal: Individualized Educational Video(s) Outcome: Completed/Met   Problem: Activity: Goal: Capacity to carry out activities will  improve Outcome: Completed/Met   Problem: Cardiac: Goal: Ability to achieve and maintain adequate cardiopulmonary perfusion will improve Outcome: Completed/Met

## 2024-03-16 LAB — CULTURE, BLOOD (ROUTINE X 2)
Culture: NO GROWTH
Culture: NO GROWTH

## 2024-03-19 ENCOUNTER — Ambulatory Visit: Payer: Medicare Other | Admitting: Radiation Oncology

## 2024-03-22 ENCOUNTER — Ambulatory Visit (HOSPITAL_BASED_OUTPATIENT_CLINIC_OR_DEPARTMENT_OTHER): Admitting: Family Medicine

## 2024-03-22 ENCOUNTER — Encounter: Payer: Self-pay | Admitting: Internal Medicine

## 2024-03-22 ENCOUNTER — Encounter (HOSPITAL_BASED_OUTPATIENT_CLINIC_OR_DEPARTMENT_OTHER): Payer: Self-pay | Admitting: Family Medicine

## 2024-03-22 VITALS — BP 92/63 | HR 108 | Temp 98.1°F | Resp 16 | Ht 61.42 in | Wt 78.8 lb

## 2024-03-22 DIAGNOSIS — Z85118 Personal history of other malignant neoplasm of bronchus and lung: Secondary | ICD-10-CM | POA: Insufficient documentation

## 2024-03-22 DIAGNOSIS — M810 Age-related osteoporosis without current pathological fracture: Secondary | ICD-10-CM

## 2024-03-22 DIAGNOSIS — E440 Moderate protein-calorie malnutrition: Secondary | ICD-10-CM | POA: Diagnosis not present

## 2024-03-22 NOTE — Progress Notes (Unsigned)
 Established Patient Office Visit  Subjective   Patient ID: [name], female    DOB: [birth year]  Age: 68 y.o. MRN: [id number]  Chief Complaint  Patient presents with   Establish Care    Pt. Here to establish care.    hospital f/u    Pt. Was seen at [facility] on 02/15/2024 due to PNA.  Pt. Denies pain at this time.    F/u as above.  New to my practice.  Recently hospitalized for Pneumonia with that d/c summary reviewed.  Appetite has been pretty good. Recent Esophageal stricture was dilated, but her dysphagia persists.  Being f/by [facility].  Prefers to live at home alone, but readily agrees to home health and PT.    Past Medical History:  Diagnosis Date   Allergy    Asthma    Cataract    COPD (chronic obstructive pulmonary disease) (HCC)    Duodenal ulcer    Dysphagia    f/by Dr. Elvin Hammer of [facility]   Family history of malignant neoplasm of gastrointestinal tract    GERD (gastroesophageal reflux disease)    Hiatal hernia    History of lung cancer    (Small Cell) on Right, with subsequent Chemoradiation, f/by [facility]   History of tobacco abuse    Chronic, heavy in past   HTN (hypertension) 10/03/2014   Hyperlipemia    Macular degeneration    known to Ophtho with "no follow up needed"   Osteoporosis    Pancytopenia due to antineoplastic chemotherapy (HCC)    Peripheral vascular disease (HCC)    Pneumonia    hx of   Trigeminal neuralgia    Vitamin D  deficiency     Outpatient Encounter Medications as of 03/22/2024  Medication Sig   albuterol  (PROVENTIL ) (2.5 MG/3ML) 0.083% nebulizer solution Take 3 mLs (2.5 mg total) by nebulization every 6 (six) hours as needed for wheezing or shortness of breath.   albuterol  (VENTOLIN  HFA) 108 (90 Base) MCG/ACT inhaler 2 Inhalations 15 to 20  minutes apart every 4 hours to Rescue Asthma (Patient taking differently: Inhale 2 puffs into the lungs every 4 (four) hours as needed for shortness of breath or wheezing  (for asthma symptoms- use 15-20 minutes apart).)   ARTIFICIAL TEARS PF 0.1-0.3 % SOLN Place 1 drop into both eyes 4 (four) times daily as needed (for dryness).   ascorbic acid (VITAMIN C) 500 MG tablet Take 500 mg by mouth daily.   Cholecalciferol  (VITAMIN D3) 125 MCG (5000 UT) CAPS Take 5,000 Units by mouth daily.   feeding supplement (ENSURE ENLIVE / ENSURE PLUS) LIQD Take 237 mLs by mouth 2 (two) times daily between meals.   ipratropium-albuterol  (DUONEB) 0.5-2.5 (3) MG/3ML SOLN Take 3 mLs by nebulization every 4 (four) hours as needed. Max:6 doses per day (Patient taking differently: Take 3 mLs by nebulization every 4 (four) hours as needed (for bronchospasms; max of 6 doses/day).)   nystatin  (MYCOSTATIN ) 100000 UNIT/ML suspension Take 5 mLs (500,000 Units total) by mouth 4 (four) times daily.   omeprazole  (PRILOSEC) 40 MG capsule Take 1 capsule (40 mg total) by mouth 2 (two) times daily before a meal.   ondansetron  (ZOFRAN ) 8 MG tablet Take 1 tablet (8 mg total) by mouth every 8 (eight) hours as needed for nausea or vomiting. Starting 3 days after chemotherapy   No facility-administered encounter medications on file as of 03/22/2024.    Social History   Tobacco Use   Smoking status: Former  Current packs/day: 0.00    Average packs/day: 0.3 packs/day for 36.0 years (9.0 ttl pk-yrs)    Types: Cigarettes    Start date: 09/14/1981    Quit date: 09/14/2017    Years since quitting: 6.5   Smokeless tobacco: Never   Tobacco comments:    1-2 sometimes not daily  Vaping Use   Vaping status: Never Used  Substance Use Topics   Alcohol use: Not Currently    Alcohol/week: 0.0 standard drinks of alcohol   Drug use: No      Review of Systems  Constitutional:  Negative for diaphoresis, fever, malaise/fatigue and weight loss.  Respiratory:  Negative for cough, shortness of breath and wheezing.   Cardiovascular:  Negative for chest pain, palpitations, orthopnea, claudication, leg swelling  and PND.      Objective:     BP 92/63 (BP Location: Right Arm, Patient Position: Standing, Cuff Size: Normal)   Pulse (!) 108   Temp 98.1 F (36.7 C) (Oral)   Resp 16   Ht 5' 1.42" (1.56 m)   Wt 78 lb 12.8 oz (35.7 kg)   SpO2 97%   BMI 14.69 kg/m    Physical Exam Constitutional:      General: She is not in acute distress.    Appearance: Normal appearance.     Comments: Brenda, cachectic  HENT:     Head: Normocephalic.     Mouth/Throat:     Pharynx: Oropharynx is clear.  Neck:     Vascular: No carotid bruit.  Cardiovascular:     Rate and Rhythm: Normal rate and regular rhythm.     Heart sounds: Normal heart sounds.     Comments: 1+ pedal pulses Pulmonary:     Effort: Pulmonary effort is normal.     Breath sounds: Normal breath sounds.  Abdominal:     General: Bowel sounds are normal.     Palpations: Abdomen is soft.  Musculoskeletal:     Cervical back: Neck supple. No tenderness.     Right lower leg: No edema.     Left lower leg: No edema.     Comments: Mod. Diffuse OA  Neurological:     Mental Status: She is alert.      No results found for any visits on 03/22/24.    The 10-year ASCVD risk score (Arnett DK, et al., 2019) is: 2.9%    Assessment & Plan:  Protein-calorie malnutrition, moderate (HCC) -     Ambulatory referral to Home Health  History of lung cancer Assessment & Plan: Prefers living at home by herself.  Fortunately she does have supportive family nearby.  Will arrange home health and home PT per her request.  She and her daughter confirm that her appetite is fine, and therefore will defer Remeron for now.  Discussed importance of remaining tobacco free.  45 mins on her care reviewing extensive records, labs, and extended discussion.  Orders: -     Ambulatory referral to Home Health  Age-related osteoporosis without current pathological fracture -     HM DEXA SCAN    Return in about 4 weeks (around 04/19/2024) for chronic follow-up.     REDDING Reece Cane., MD

## 2024-03-22 NOTE — Assessment & Plan Note (Addendum)
 Prefers living at home by herself.  Fortunately she does have supportive family nearby.  Will arrange home health and home PT per her request.  She and her daughter confirm that her appetite is fine, and therefore will defer Remeron for now.  Discussed importance of remaining tobacco free.

## 2024-03-23 ENCOUNTER — Encounter (HOSPITAL_BASED_OUTPATIENT_CLINIC_OR_DEPARTMENT_OTHER): Payer: Self-pay | Admitting: Family Medicine

## 2024-03-30 ENCOUNTER — Other Ambulatory Visit (HOSPITAL_BASED_OUTPATIENT_CLINIC_OR_DEPARTMENT_OTHER): Payer: Self-pay | Admitting: Family Medicine

## 2024-03-30 ENCOUNTER — Encounter (HOSPITAL_BASED_OUTPATIENT_CLINIC_OR_DEPARTMENT_OTHER): Payer: Self-pay | Admitting: Family Medicine

## 2024-03-30 ENCOUNTER — Encounter: Payer: Self-pay | Admitting: Internal Medicine

## 2024-03-30 DIAGNOSIS — J441 Chronic obstructive pulmonary disease with (acute) exacerbation: Secondary | ICD-10-CM

## 2024-03-30 DIAGNOSIS — J449 Chronic obstructive pulmonary disease, unspecified: Secondary | ICD-10-CM

## 2024-03-30 MED ORDER — ALBUTEROL SULFATE HFA 108 (90 BASE) MCG/ACT IN AERS
2.0000 | INHALATION_SPRAY | RESPIRATORY_TRACT | 5 refills | Status: DC | PRN
Start: 2024-03-30 — End: 2024-03-30

## 2024-03-30 MED ORDER — ALBUTEROL SULFATE HFA 108 (90 BASE) MCG/ACT IN AERS
2.0000 | INHALATION_SPRAY | RESPIRATORY_TRACT | 5 refills | Status: DC | PRN
Start: 2024-03-30 — End: 2024-06-03

## 2024-04-19 ENCOUNTER — Ambulatory Visit (HOSPITAL_BASED_OUTPATIENT_CLINIC_OR_DEPARTMENT_OTHER)
Admission: RE | Admit: 2024-04-19 | Discharge: 2024-04-19 | Disposition: A | Discharge status: Home / Self Care | Source: Ambulatory Visit | Attending: Nurse Practitioner | Admitting: Nurse Practitioner

## 2024-04-19 ENCOUNTER — Encounter (HOSPITAL_BASED_OUTPATIENT_CLINIC_OR_DEPARTMENT_OTHER): Payer: Self-pay | Admitting: Family Medicine

## 2024-04-19 ENCOUNTER — Other Ambulatory Visit (HOSPITAL_BASED_OUTPATIENT_CLINIC_OR_DEPARTMENT_OTHER): Admitting: Radiology

## 2024-04-19 ENCOUNTER — Ambulatory Visit (HOSPITAL_BASED_OUTPATIENT_CLINIC_OR_DEPARTMENT_OTHER): Admitting: Family Medicine

## 2024-04-19 VITALS — BP 104/69 | HR 100 | Temp 98.0°F | Resp 16 | Wt 83.2 lb

## 2024-04-19 DIAGNOSIS — J4489 Other specified chronic obstructive pulmonary disease: Secondary | ICD-10-CM

## 2024-04-19 DIAGNOSIS — Z85118 Personal history of other malignant neoplasm of bronchus and lung: Secondary | ICD-10-CM | POA: Diagnosis not present

## 2024-04-19 DIAGNOSIS — M810 Age-related osteoporosis without current pathological fracture: Secondary | ICD-10-CM

## 2024-04-19 NOTE — Progress Notes (Signed)
 Established Patient Office Visit  Subjective   Patient ID: [name], female    DOB: Aug 12, 1956  Age: 68 y.o. MRN: [id number]  Chief Complaint  Patient presents with   Follow-up    Follow-up visit    F/u as above.  Please see last note for details.  She is getting home health assistance, but is getting a little frustrated with them.  I'm pleased to hear that she clearly feels better today.  Appetite reportedly fine.  States she is breathing pretty well.    Past Medical History:  Diagnosis Date   Allergy    Asthma    Cataract    COPD (chronic obstructive pulmonary disease) (HCC)    Duodenal ulcer    Dysphagia    f/by Dr. Abran of [facility]   Family history of malignant neoplasm of gastrointestinal tract    GERD (gastroesophageal reflux disease)    Hiatal hernia    History of lung cancer    (Small Cell) on Right, with subsequent Chemoradiation, f/by [facility]   History of tobacco abuse    Chronic, heavy in past   HTN (hypertension) 10/03/2014   Hyperlipemia    Macular degeneration    known to Ophtho with no follow up needed   Osteoporosis    Pancytopenia due to antineoplastic chemotherapy (HCC)    Peripheral vascular disease (HCC)    Pneumonia    hx of   Trigeminal neuralgia    Vitamin D  deficiency     Outpatient Encounter Medications as of 04/19/2024  Medication Sig   albuterol  (PROVENTIL ) (2.5 MG/3ML) 0.083% nebulizer solution Take 3 mLs (2.5 mg total) by nebulization every 6 (six) hours as needed for wheezing or shortness of breath.   albuterol  (VENTOLIN  HFA) 108 (90 Base) MCG/ACT inhaler Inhale 2 puffs into the lungs every 4 (four) hours as needed for shortness of breath or wheezing (for asthma symptoms- use 15-20 minutes apart).   ARTIFICIAL TEARS PF 0.1-0.3 % SOLN Place 1 drop into both eyes 4 (four) times daily as needed (for dryness).   ascorbic acid (VITAMIN C) 500 MG tablet Take 500 mg by mouth daily.   Cholecalciferol  (VITAMIN D3) 125 MCG  (5000 UT) CAPS Take 5,000 Units by mouth daily.   feeding supplement (ENSURE ENLIVE / ENSURE PLUS) LIQD Take 237 mLs by mouth 2 (two) times daily between meals.   ipratropium-albuterol  (DUONEB) 0.5-2.5 (3) MG/3ML SOLN Take 3 mLs by nebulization every 4 (four) hours as needed. Max:6 doses per day   nystatin  (MYCOSTATIN ) 100000 UNIT/ML suspension Take 5 mLs (500,000 Units total) by mouth 4 (four) times daily.   omeprazole  (PRILOSEC) 40 MG capsule Take 1 capsule (40 mg total) by mouth 2 (two) times daily before a meal.   ondansetron  (ZOFRAN ) 8 MG tablet Take 1 tablet (8 mg total) by mouth every 8 (eight) hours as needed for nausea or vomiting. Starting 3 days after chemotherapy   No facility-administered encounter medications on file as of 04/19/2024.    Social History   Tobacco Use   Smoking status: Former    Current packs/day: 0.00    Average packs/day: 0.3 packs/day for 36.0 years (9.0 ttl pk-yrs)    Types: Cigarettes    Start date: 09/14/1981    Quit date: 09/14/2017    Years since quitting: 6.6   Smokeless tobacco: Never   Tobacco comments:    1-2 sometimes not daily  Vaping Use   Vaping status: Never Used  Substance Use Topics  Alcohol use: Not Currently    Alcohol/week: 0.0 standard drinks of alcohol   Drug use: No      ROS    Objective:     BP 104/69 (BP Location: Right Arm, Patient Position: Standing, Cuff Size: Normal)   Pulse 100   Temp 98 F (36.7 C) (Oral)   Resp 16   Wt 83 lb 3.2 oz (37.7 kg)   SpO2 98%   BMI 15.51 kg/m    Physical Exam Constitutional:      General: She is not in acute distress.    Appearance: Normal appearance.     Comments: Chronically ill appearing.  Lean.  HENT:     Head: Normocephalic.  Neck:     Vascular: No carotid bruit.  Cardiovascular:     Rate and Rhythm: Normal rate and regular rhythm.     Pulses: Normal pulses.     Heart sounds: Normal heart sounds.  Pulmonary:     Effort: Pulmonary effort is normal.     Breath  sounds: Normal breath sounds.  Abdominal:     General: Bowel sounds are normal.     Palpations: Abdomen is soft.  Musculoskeletal:     Cervical back: Neck supple. No tenderness.     Right lower leg: No edema.     Left lower leg: No edema.  Neurological:     Mental Status: She is alert.      No results found for any visits on 04/19/24.    The 10-year ASCVD risk score (Arnett DK, et al., 2019) is: 4.1%    Assessment & Plan:  Asthma-COPD overlap syndrome (HCC) Assessment & Plan: Improved.  Dexascan later this morning.  I urged a Mammogram and she declines for now.  F/u items include considering a newer inhaler for her COPD if desired.   History of lung cancer  Age-related osteoporosis without current pathological fracture    Return in about 3 months (around 07/20/2024) for chronic follow-up.    REDDING PONCE NORLEEN FALCON., MD

## 2024-04-19 NOTE — Assessment & Plan Note (Addendum)
 Improved.  Dexascan later this morning.  I urged a Mammogram and she declines for now.  F/u items include considering a newer inhaler for her COPD if desired.

## 2024-04-20 ENCOUNTER — Other Ambulatory Visit: Payer: Self-pay

## 2024-05-10 ENCOUNTER — Encounter: Payer: Medicare Other | Admitting: Nurse Practitioner

## 2024-05-14 ENCOUNTER — Inpatient Hospital Stay: Attending: Physician Assistant

## 2024-05-14 ENCOUNTER — Ambulatory Visit (HOSPITAL_COMMUNITY)
Admission: RE | Admit: 2024-05-14 | Discharge: 2024-05-14 | Disposition: A | Discharge status: Home / Self Care | Source: Ambulatory Visit | Attending: Internal Medicine | Admitting: Internal Medicine

## 2024-05-14 ENCOUNTER — Ambulatory Visit: Payer: Self-pay

## 2024-05-14 ENCOUNTER — Encounter: Payer: Self-pay | Admitting: Internal Medicine

## 2024-05-14 DIAGNOSIS — C3490 Malignant neoplasm of unspecified part of unspecified bronchus or lung: Secondary | ICD-10-CM | POA: Insufficient documentation

## 2024-05-14 DIAGNOSIS — Z87891 Personal history of nicotine dependence: Secondary | ICD-10-CM | POA: Insufficient documentation

## 2024-05-14 DIAGNOSIS — C3401 Malignant neoplasm of right main bronchus: Secondary | ICD-10-CM | POA: Diagnosis present

## 2024-05-14 LAB — CBC WITH DIFFERENTIAL (CANCER CENTER ONLY)
Abs Immature Granulocytes: 0.02 K/uL (ref 0.00–0.07)
Basophils Absolute: 0 K/uL (ref 0.0–0.1)
Basophils Relative: 0 %
Eosinophils Absolute: 0 K/uL (ref 0.0–0.5)
Eosinophils Relative: 1 %
HCT: 28.5 % — ABNORMAL LOW (ref 36.0–46.0)
Hemoglobin: 9.8 g/dL — ABNORMAL LOW (ref 12.0–15.0)
Immature Granulocytes: 1 %
Lymphocytes Relative: 27 %
Lymphs Abs: 0.9 K/uL (ref 0.7–4.0)
MCH: 41.5 pg — ABNORMAL HIGH (ref 26.0–34.0)
MCHC: 34.4 g/dL (ref 30.0–36.0)
MCV: 120.8 fL — ABNORMAL HIGH (ref 80.0–100.0)
Monocytes Absolute: 0.4 K/uL (ref 0.1–1.0)
Monocytes Relative: 12 %
Neutro Abs: 2 K/uL (ref 1.7–7.7)
Neutrophils Relative %: 59 %
Platelet Count: 59 K/uL — ABNORMAL LOW (ref 150–400)
RBC: 2.36 MIL/uL — ABNORMAL LOW (ref 3.87–5.11)
RDW: 18 % — ABNORMAL HIGH (ref 11.5–15.5)
WBC Count: 3.3 K/uL — ABNORMAL LOW (ref 4.0–10.5)
nRBC: 0.6 % — ABNORMAL HIGH (ref 0.0–0.2)

## 2024-05-14 LAB — CMP (CANCER CENTER ONLY)
ALT: 6 U/L (ref 0–44)
AST: 14 U/L — ABNORMAL LOW (ref 15–41)
Albumin: 3.2 g/dL — ABNORMAL LOW (ref 3.5–5.0)
Alkaline Phosphatase: 109 U/L (ref 38–126)
Anion gap: 7 (ref 5–15)
BUN: 13 mg/dL (ref 8–23)
CO2: 24 mmol/L (ref 22–32)
Calcium: 8.1 mg/dL — ABNORMAL LOW (ref 8.9–10.3)
Chloride: 109 mmol/L (ref 98–111)
Creatinine: 0.69 mg/dL (ref 0.44–1.00)
GFR, Estimated: >60 mL/min (ref >60)
Glucose, Bld: 77 mg/dL (ref 70–99)
Potassium: 4.6 mmol/L (ref 3.5–5.1)
Sodium: 140 mmol/L (ref 135–145)
Total Bilirubin: 0.2 mg/dL (ref 0.0–1.2)
Total Protein: 5.8 g/dL — ABNORMAL LOW (ref 6.5–8.1)

## 2024-05-14 MED ORDER — IOHEXOL 300 MG/ML  SOLN
75.0000 mL | Freq: Once | INTRAMUSCULAR | Status: AC | PRN
  Administered 2024-05-14: 75 mL via INTRAVENOUS

## 2024-05-14 NOTE — Telephone Encounter (Signed)
 FYI Only or Action Required?: FYI only for provider.  Patient was last seen in primary care on 04/19/2024 by [name], MD.  Called Nurse Triage reporting No chief complaint on file..  Symptoms began several days ago.  Interventions attempted: Nothing.  Symptoms are: gradually worsening.  Triage Disposition: See Physician Within 24 Hours  Patient/caregiver understands and will follow disposition?: Yes Reason for Disposition  [1] MODERATE leg swelling (e.g., swelling extends up to knees) AND [2] new-onset or getting worse  Answer Assessment - Initial Assessment Questions 1. ONSET: When did the swelling start? (e.g., minutes, hours, days)     *No Answer* 2. LOCATION: What part of the leg is swollen?  Are both legs swollen or just one leg?     Bilateral,  3. SEVERITY: How bad is the swelling? (e.g., localized; mild, moderate, severe)     Moderate  4. REDNESS: Is there redness or signs of infection?     No  5. PAIN: Is the swelling painful to touch? If Yes, ask: How painful is it?   (Scale 1-10; mild, moderate or severe)     Mild to Moderate  6. FEVER: Do you have a fever? If Yes, ask: What is it, how was it measured, and when did it start?      No  7. CAUSE: What do you think is causing the leg swelling?     Unsure  8. MEDICAL HISTORY: Do you have a history of blood clots (e.g., DVT), cancer, heart failure, kidney disease, or liver failure?     *No Answer*  9. RECURRENT SYMPTOM: Have you had leg swelling before? If Yes, ask: When was the last time? What happened that time?     No  10. OTHER SYMPTOMS: Do you have any other symptoms? (e.g., chest pain, difficulty breathing)       Denies any other symptoms  11. PREGNANCY: Is there any chance you are pregnant? When was your last menstrual period?       No and No  Protocols used: Leg Swelling and Edema-A-AH

## 2024-05-14 NOTE — Telephone Encounter (Signed)
 first attempt: mailbox is full and cannot accept messages at this time   Message from [place] P sent at 05/14/2024  1:21 PM EDT  Pt's daughter is calling because mom is having plus 3 edema.  She contacted her oncologist who suggests she call her primary.

## 2024-05-15 ENCOUNTER — Encounter (HOSPITAL_BASED_OUTPATIENT_CLINIC_OR_DEPARTMENT_OTHER): Payer: Self-pay | Admitting: Family Medicine

## 2024-05-15 ENCOUNTER — Encounter (HOSPITAL_BASED_OUTPATIENT_CLINIC_OR_DEPARTMENT_OTHER): Payer: Self-pay

## 2024-05-15 ENCOUNTER — Ambulatory Visit (HOSPITAL_BASED_OUTPATIENT_CLINIC_OR_DEPARTMENT_OTHER): Admitting: Family Medicine

## 2024-05-15 VITALS — BP 108/64 | HR 93 | Temp 97.6°F | Resp 16 | Wt 82.4 lb

## 2024-05-15 DIAGNOSIS — R600 Localized edema: Secondary | ICD-10-CM | POA: Diagnosis not present

## 2024-05-15 DIAGNOSIS — Z85118 Personal history of other malignant neoplasm of bronchus and lung: Secondary | ICD-10-CM | POA: Diagnosis not present

## 2024-05-15 DIAGNOSIS — I10 Essential (primary) hypertension: Secondary | ICD-10-CM

## 2024-05-15 MED ORDER — FUROSEMIDE 20 MG PO TABS: 20.0000 mg | ORAL_TABLET | Freq: Every day | ORAL | 3 refills | Status: DC

## 2024-05-15 NOTE — Progress Notes (Signed)
 Established Patient Office Visit  Subjective   Patient ID: [name], female    DOB: 07-Nov-1955  Age: 68 y.o. MRN: [id number]  Chief Complaint  Patient presents with   Bilateral leg swelling    Bilateral leg swelling    F/u as above.  Please see recent notes for details.  She reports 3-4 weeks of progressively worsening bilateral leg swelling.  She states she is quite careful with her salt intake.  Denies any PND or Orthopnea in lay terms.  Her COPD is reportedly improved and real good.    Past Medical History:  Diagnosis Date   Allergy    Asthma    Cataract    COPD (chronic obstructive pulmonary disease) (HCC)    Duodenal ulcer    Dysphagia    [facility]   GERD (gastroesophageal reflux disease)    Hiatal hernia    History of lung cancer    (Small Cell) on Right, with subsequent Chemoradiation, f/by [facility]   History of tobacco abuse    Chronic, heavy in past   HTN (hypertension) 10/03/2014   Hyperlipemia    Macular degeneration    known to Ophtho with no follow up needed   Osteoporosis    Pancytopenia due to antineoplastic chemotherapy (HCC)    Peripheral vascular disease (HCC)    Pneumonia    hx of   Trigeminal neuralgia    Vitamin D  deficiency     Outpatient Encounter Medications as of 05/15/2024  Medication Sig   albuterol  (PROVENTIL ) (2.5 MG/3ML) 0.083% nebulizer solution Take 3 mLs (2.5 mg total) by nebulization every 6 (six) hours as needed for wheezing or shortness of breath.   albuterol  (VENTOLIN  HFA) 108 (90 Base) MCG/ACT inhaler Inhale 2 puffs into the lungs every 4 (four) hours as needed for shortness of breath or wheezing (for asthma symptoms- use 15-20 minutes apart).   ARTIFICIAL TEARS PF 0.1-0.3 % SOLN Place 1 drop into both eyes 4 (four) times daily as needed (for dryness).   ascorbic acid (VITAMIN C) 500 MG tablet Take 500 mg by mouth daily.   Cholecalciferol  (VITAMIN D3) 125 MCG (5000 UT) CAPS Take 5,000 Units  by mouth daily.   feeding supplement (ENSURE ENLIVE / ENSURE PLUS) LIQD Take 237 mLs by mouth 2 (two) times daily between meals.   ipratropium-albuterol  (DUONEB) 0.5-2.5 (3) MG/3ML SOLN Take 3 mLs by nebulization every 4 (four) hours as needed. Max:6 doses per day   nystatin  (MYCOSTATIN ) 100000 UNIT/ML suspension Take 5 mLs (500,000 Units total) by mouth 4 (four) times daily.   omeprazole  (PRILOSEC) 40 MG capsule Take 1 capsule (40 mg total) by mouth 2 (two) times daily before a meal.   ondansetron  (ZOFRAN ) 8 MG tablet Take 1 tablet (8 mg total) by mouth every 8 (eight) hours as needed for nausea or vomiting. Starting 3 days after chemotherapy   [DISCONTINUED] furosemide  (LASIX ) 20 MG tablet Take 1 tablet (20 mg total) by mouth daily.   furosemide  (LASIX ) 20 MG tablet Take 1 tablet (20 mg total) by mouth daily.   No facility-administered encounter medications on file as of 05/15/2024.    Social History   Tobacco Use   Smoking status: Former    Current packs/day: 0.00    Average packs/day: 0.3 packs/day for 36.0 years (9.0 ttl pk-yrs)    Types: Cigarettes    Start date: 09/14/1981    Quit date: 09/14/2017    Years since quitting: 6.6   Smokeless  tobacco: Never   Tobacco comments:    1-2 sometimes not daily  Vaping Use   Vaping status: Never Used  Substance Use Topics   Alcohol use: Not Currently    Alcohol/week: 0.0 standard drinks of alcohol   Drug use: No      Review of Systems  Constitutional:  Negative for diaphoresis, fever, malaise/fatigue and weight loss.  Respiratory:  Negative for cough, shortness of breath and wheezing.   Cardiovascular:  Positive for leg swelling. Negative for chest pain, palpitations, orthopnea, claudication and PND.      Objective:     BP 108/64 (BP Location: Right Arm, Patient Position: Standing;Sitting, Cuff Size: Normal)   Pulse 93   Temp 97.6 F (36.4 C) (Oral)   Resp 16   Wt 82 lb 6.4 oz (37.4 kg)   SpO2 97%   BMI 15.36 kg/m     Physical Exam Constitutional:      General: She is not in acute distress.    Appearance: Normal appearance.  HENT:     Head: Normocephalic.  Neck:     Vascular: No carotid bruit.  Cardiovascular:     Rate and Rhythm: Normal rate and regular rhythm.     Pulses: Normal pulses.     Heart sounds: Normal heart sounds.  Pulmonary:     Effort: Pulmonary effort is normal.     Breath sounds: Normal breath sounds.  Abdominal:     General: Bowel sounds are normal.     Palpations: Abdomen is soft.  Musculoskeletal:     Cervical back: Neck supple. No tenderness.     Right lower leg: Edema present.     Left lower leg: Edema present.     Comments: 1-2+ bilateral LE edema up to mid shin.  1+ pedal pulses.  Neurological:     Mental Status: She is alert.      No results found for any visits on 05/15/24.    The 10-year ASCVD risk score (Arnett DK, et al., 2019) is: 5.9%    Assessment & Plan:  Bilateral leg edema Assessment & Plan: Unclear etiology.  DASH diet.  Elevate legs.  Due to her malignancy, will r/o DVT's first thing tomorrow.  No other clinical evidence of CHF.  Obviously some venous insufficiency.  Orders: -     Furosemide ; Take 1 tablet (20 mg total) by mouth daily.  Dispense: 30 tablet; Refill: 3 -     US  Venous Img Lower Bilateral (DVT); Future  History of lung cancer  Primary hypertension    Return in about 4 weeks (around 06/12/2024) for chronic follow-up.    REDDING PONCE NORLEEN FALCON., MD

## 2024-05-15 NOTE — Assessment & Plan Note (Signed)
 Unclear etiology.  DASH diet.  Elevate legs.  Due to her malignancy, will r/o DVT's first thing tomorrow.  No other clinical evidence of CHF.  Obviously some venous insufficiency.

## 2024-05-17 ENCOUNTER — Ambulatory Visit (HOSPITAL_BASED_OUTPATIENT_CLINIC_OR_DEPARTMENT_OTHER): Payer: Self-pay | Admitting: Family Medicine

## 2024-05-17 ENCOUNTER — Inpatient Hospital Stay (HOSPITAL_BASED_OUTPATIENT_CLINIC_OR_DEPARTMENT_OTHER)
Admission: RE | Admit: 2024-05-17 | Discharge: 2024-05-17 | Disposition: A | Discharge status: Home / Self Care | Source: Ambulatory Visit | Attending: Family Medicine | Admitting: Family Medicine

## 2024-05-17 DIAGNOSIS — R600 Localized edema: Secondary | ICD-10-CM | POA: Diagnosis not present

## 2024-05-21 ENCOUNTER — Encounter (HOSPITAL_BASED_OUTPATIENT_CLINIC_OR_DEPARTMENT_OTHER): Payer: Self-pay | Admitting: Family Medicine

## 2024-05-22 ENCOUNTER — Inpatient Hospital Stay: Attending: Physician Assistant | Admitting: Internal Medicine

## 2024-05-22 VITALS — BP 93/58 | HR 79 | Temp 98.9°F | Resp 15 | Ht 61.0 in | Wt 81.9 lb

## 2024-05-22 DIAGNOSIS — Z923 Personal history of irradiation: Secondary | ICD-10-CM | POA: Insufficient documentation

## 2024-05-22 DIAGNOSIS — R609 Edema, unspecified: Secondary | ICD-10-CM | POA: Insufficient documentation

## 2024-05-22 DIAGNOSIS — Z79899 Other long term (current) drug therapy: Secondary | ICD-10-CM | POA: Diagnosis not present

## 2024-05-22 DIAGNOSIS — R1310 Dysphagia, unspecified: Secondary | ICD-10-CM | POA: Diagnosis not present

## 2024-05-22 DIAGNOSIS — C3401 Malignant neoplasm of right main bronchus: Secondary | ICD-10-CM | POA: Diagnosis present

## 2024-05-22 DIAGNOSIS — C3490 Malignant neoplasm of unspecified part of unspecified bronchus or lung: Secondary | ICD-10-CM

## 2024-05-22 DIAGNOSIS — R059 Cough, unspecified: Secondary | ICD-10-CM | POA: Insufficient documentation

## 2024-05-22 DIAGNOSIS — Z87891 Personal history of nicotine dependence: Secondary | ICD-10-CM | POA: Insufficient documentation

## 2024-05-22 DIAGNOSIS — D6481 Anemia due to antineoplastic chemotherapy: Secondary | ICD-10-CM | POA: Diagnosis not present

## 2024-05-22 DIAGNOSIS — Z9221 Personal history of antineoplastic chemotherapy: Secondary | ICD-10-CM | POA: Diagnosis not present

## 2024-05-22 NOTE — Progress Notes (Signed)
 [facility] [phone]   [phone]  OFFICE PROGRESS NOTE  [name], MD [address] [place] [address]  DIAGNOSIS: Limited stage small cell lung cancer (T1c, N2, M0) small cell lung cancer. The patient presented with a hilar mass and right paratracheal lymphadenopathy. She was diagnosed in October 2024.    PRIOR THERAPY: Systemic chemotherapy with cisplatin  on day 1 and etoposide  100 mg/m on days 1, 2, and 3 IV every 3 weeks.  This is concurrent radiation.  First dose on 08/08/2023. Status post 4 cycles.    CURRENT THERAPY: Observation.  INTERVAL HISTORY: [name] 68 y.o. female returns to the clinic today for follow-up visit accompanied by her daughter. Discussed the use of AI scribe software for clinical note transcription with the patient, who gave verbal consent to proceed.  History of Present Illness [name] is a 68 year old female with limited stage small cell lung cancer who presents for evaluation with repeat CT scan for restaging. She is accompanied by her daughter.  Diagnosed with limited stage small cell lung cancer in October 2024, she completed systemic chemotherapy with cisplatin  and etoposide  every three weeks for four cycles, concurrent with radiation therapy.  She experiences ongoing swelling in her legs, which was evaluated for blood clots with negative results. No new medications have been introduced except for her acid reflux medication and a diuretic. No recent bleeding, bruising, epistaxis, or gum bleeding. Her blood counts remain low and have not yet recovered post-treatment.  She has a persistent wet-sounding cough, which worsens with exertion. She is considering another esophagogastroduodenoscopy (EGD) to address swallowing difficulties, as she had complications including double pneumonia following a previous procedure. She is currently consuming Ensure for nutritional  support.  She reports good breathing overall, with no issues unless exerting herself. She opted against prophylactic cranial irradiation despite it being a standard recommendation for limited stage small cell lung cancer.     MEDICAL HISTORY: Past Medical History:  Diagnosis Date   Allergy    Asthma    Cataract    COPD (chronic obstructive pulmonary disease) (HCC)    Duodenal ulcer    Dysphagia    f/by Dr. Abran of [facility]   GERD (gastroesophageal reflux disease)    Hiatal hernia    History of lung cancer    (Small Cell) on Right, with subsequent Chemoradiation, f/by [facility]   History of tobacco abuse    Chronic, heavy in past   HTN (hypertension) 10/03/2014   Hyperlipemia    Macular degeneration    known to Ophtho with no follow up needed   Osteoporosis    Pancytopenia due to antineoplastic chemotherapy (HCC)    Peripheral vascular disease (HCC)    Pneumonia    hx of   Trigeminal neuralgia    Vitamin D  deficiency     ALLERGIES:  is allergic to tape, celebrex [celecoxib], decadron  [dexamethasone ], penicillins, crestor [rosuvastatin], doxycycline , hydrocodone -acetaminophen , and pravastatin.  MEDICATIONS:  Current Outpatient Medications  Medication Sig Dispense Refill   albuterol  (PROVENTIL ) (2.5 MG/3ML) 0.083% nebulizer solution Take 3 mLs (2.5 mg total) by nebulization every 6 (six) hours as needed for wheezing or shortness of breath. 75 mL 12   albuterol  (VENTOLIN  HFA) 108 (90 Base) MCG/ACT inhaler Inhale 2 puffs into the lungs every 4 (four) hours as needed for shortness of breath or wheezing (for asthma symptoms- use 15-20 minutes apart). 1 each 5  ARTIFICIAL TEARS PF 0.1-0.3 % SOLN Place 1 drop into both eyes 4 (four) times daily as needed (for dryness).     ascorbic acid (VITAMIN C) 500 MG tablet Take 500 mg by mouth daily.     Cholecalciferol  (VITAMIN D3) 125 MCG (5000 UT) CAPS Take 5,000 Units by mouth daily.     feeding supplement (ENSURE ENLIVE /  ENSURE PLUS) LIQD Take 237 mLs by mouth 2 (two) times daily between meals.     furosemide  (LASIX ) 20 MG tablet Take 1 tablet (20 mg total) by mouth daily. 30 tablet 3   ipratropium-albuterol  (DUONEB) 0.5-2.5 (3) MG/3ML SOLN Take 3 mLs by nebulization every 4 (four) hours as needed. Max:6 doses per day 540 mL 0   nystatin  (MYCOSTATIN ) 100000 UNIT/ML suspension Take 5 mLs (500,000 Units total) by mouth 4 (four) times daily. 60 mL 0   omeprazole  (PRILOSEC) 40 MG capsule Take 1 capsule (40 mg total) by mouth 2 (two) times daily before a meal. 180 capsule 3   ondansetron  (ZOFRAN ) 8 MG tablet Take 1 tablet (8 mg total) by mouth every 8 (eight) hours as needed for nausea or vomiting. Starting 3 days after chemotherapy 30 tablet 1   No current facility-administered medications for this visit.    SURGICAL HISTORY:  Past Surgical History:  Procedure Laterality Date   ABDOMINAL HYSTERECTOMY     APPENDECTOMY     BALLOON DILATION N/A 02/27/2024   Procedure: BALLOON DILATION;  Surgeon: Albertus Gordy HERO, MD;  Location: [facility];  Service: Gastroenterology;  Laterality: N/A;   BONE BIOPSY  02/14/2024   Procedure: BIOPSY;  Surgeon: Charlanne Groom, MD;  Location: [facility];  Service: Gastroenterology;;   CATARACT EXTRACTION, BILATERAL     COLONOSCOPY     CRANIOTOMY     for trigeminal neuralgia   ELBOW SURGERY Right    ENDARTERECTOMY Right 12/16/2014   Procedure: ENDARTERECTOMY CAROTID with Dacron patch;  Surgeon: Krystal JULIANNA Doing, MD;  Location: [facility] OR;  Service: Vascular;  Laterality: Right;   ESOPHAGOGASTRODUODENOSCOPY  10/2008   ESOPHAGOGASTRODUODENOSCOPY N/A 02/14/2024   Procedure: EGD (ESOPHAGOGASTRODUODENOSCOPY);  Surgeon: Charlanne Groom, MD;  Location: [facility];  Service: Gastroenterology;  Laterality: N/A;   ESOPHAGOGASTRODUODENOSCOPY N/A 02/27/2024   Procedure: EGD (ESOPHAGOGASTRODUODENOSCOPY);  Surgeon: Albertus Gordy HERO, MD;  Location: [facility];  Service: Gastroenterology;  Laterality: N/A;    FINE NEEDLE ASPIRATION BIOPSY  07/25/2023   Procedure: FINE NEEDLE ASPIRATION BIOPSY;  Surgeon: Shelah Lamar RAMAN, MD;  Location: [facility];  Service: Pulmonary;;   HAND SURGERY Left    LUMBAR DISC SURGERY     SAVORY DILATION N/A 01/26/2024   Procedure: EGD, WITH DILATION USING SAVARY-GILLIARD DILATOR OVER GUIDEWIRE;  Surgeon: Stacia Glendia BRAVO, MD;  Location: [facility];  Service: Gastroenterology;  Laterality: N/A;  Needs fluro   TOTAL HIP ARTHROPLASTY Right    trigeminal neuralgia surgery     UPPER GASTROINTESTINAL ENDOSCOPY     VIDEO BRONCHOSCOPY WITH ENDOBRONCHIAL ULTRASOUND N/A 07/25/2023   Procedure: VIDEO BRONCHOSCOPY WITH ENDOBRONCHIAL ULTRASOUND;  Surgeon: Shelah Lamar RAMAN, MD;  Location: [facility];  Service: Pulmonary;  Laterality: N/A;   WRIST SURGERY Left     REVIEW OF SYSTEMS:  Constitutional: positive for fatigue Eyes: negative Ears, nose, mouth, throat, and face: negative Respiratory: positive for cough Cardiovascular: negative Gastrointestinal: positive for dysphagia Genitourinary:negative Integument/breast: negative Hematologic/lymphatic: negative Musculoskeletal:negative Neurological: negative Behavioral/Psych: negative Endocrine: negative Allergic/Immunologic: negative   PHYSICAL EXAMINATION: General appearance: alert, cooperative, and no distress Head: Normocephalic, without obvious abnormality, atraumatic  Neck: no adenopathy, no JVD, supple, symmetrical, trachea midline, and thyroid  not enlarged, symmetric, no tenderness/mass/nodules Lymph nodes: Cervical, supraclavicular, and axillary nodes normal. Resp: clear to auscultation bilaterally Back: symmetric, no curvature. ROM normal. No CVA tenderness. Cardio: regular rate and rhythm, S1, S2 normal, no murmur, click, rub or gallop GI: soft, non-tender; bowel sounds normal; no masses,  no organomegaly Extremities: edema 1 edema bilaterally Neurologic: Alert and oriented X 3, normal strength and tone.  Normal symmetric reflexes. Normal coordination and gait  ECOG PERFORMANCE STATUS: 1 - Symptomatic but completely ambulatory  Blood pressure (!) 93/58, pulse 79, temperature 98.9 F (37.2 C), temperature source Temporal, resp. rate 15, height 5' 1 (1.549 m), weight 81 lb 14.4 oz (37.1 kg), SpO2 100%.  LABORATORY DATA: Lab Results  Component Value Date   WBC 3.3 (L) 05/14/2024   HGB 9.8 (L) 05/14/2024   HCT 28.5 (L) 05/14/2024   MCV 120.8 (H) 05/14/2024   PLT 59 (L) 05/14/2024      Chemistry      Component Value Date/Time   NA 140 05/14/2024 0905   NA 141 09/02/2016 1147   K 4.6 05/14/2024 0905   K 4.5 09/02/2016 1147   CL 109 05/14/2024 0905   CO2 24 05/14/2024 0905   CO2 20 (L) 09/02/2016 1147   BUN 13 05/14/2024 0905   BUN 13.1 09/02/2016 1147   CREATININE 0.69 05/14/2024 0905   CREATININE 0.70 05/11/2023 0930   CREATININE 0.8 09/02/2016 1147      Component Value Date/Time   CALCIUM 8.1 (L) 05/14/2024 0905   CALCIUM 9.7 09/02/2016 1147   ALKPHOS 109 05/14/2024 0905   ALKPHOS 77 09/02/2016 1147   AST 14 (L) 05/14/2024 0905   AST 16 09/02/2016 1147   ALT 6 05/14/2024 0905   ALT 9 09/02/2016 1147   BILITOT 0.2 05/14/2024 0905   BILITOT <0.22 09/02/2016 1147       RADIOGRAPHIC STUDIES: US  Venous Img Lower Bilateral (DVT) Result Date: 05/17/2024 CLINICAL DATA:  Bilateral lower extremity edema. EXAM: BILATERAL LOWER EXTREMITY VENOUS DOPPLER ULTRASOUND TECHNIQUE: Gray-scale sonography with graded compression, as well as color Doppler and duplex ultrasound were performed to evaluate the lower extremity deep venous systems from the level of the common femoral vein and including the common femoral, femoral, profunda femoral, popliteal and calf veins including the posterior tibial, peroneal and gastrocnemius veins when visible. The superficial great saphenous vein was also interrogated. Spectral Doppler was utilized to evaluate flow at rest and with distal augmentation  maneuvers in the common femoral, femoral and popliteal veins. COMPARISON:  None Available. FINDINGS: RIGHT LOWER EXTREMITY Common Femoral Vein: No evidence of thrombus. Normal compressibility, respiratory phasicity and response to augmentation. Saphenofemoral Junction: No evidence of thrombus. Normal compressibility and flow on color Doppler imaging. Profunda Femoral Vein: No evidence of thrombus. Normal compressibility and flow on color Doppler imaging. Femoral Vein: No evidence of thrombus. Normal compressibility, respiratory phasicity and response to augmentation. Popliteal Vein: No evidence of thrombus. Normal compressibility, respiratory phasicity and response to augmentation. Calf Veins: No evidence of thrombus. Normal compressibility and flow on color Doppler imaging. Superficial Great Saphenous Vein: No evidence of thrombus. Normal compressibility. Venous Reflux:  None. Other Findings:  None. LEFT LOWER EXTREMITY Common Femoral Vein: No evidence of thrombus. Normal compressibility, respiratory phasicity and response to augmentation. Saphenofemoral Junction: No evidence of thrombus. Normal compressibility and flow on color Doppler imaging. Profunda Femoral Vein: No evidence of thrombus. Normal compressibility and flow on color Doppler imaging. Femoral  Vein: No evidence of thrombus. Normal compressibility, respiratory phasicity and response to augmentation. Popliteal Vein: No evidence of thrombus. Normal compressibility, respiratory phasicity and response to augmentation. Calf Veins: No evidence of thrombus. Normal compressibility and flow on color Doppler imaging. Superficial Great Saphenous Vein: No evidence of thrombus. Normal compressibility. Venous Reflux:  None. Other Findings:  None. IMPRESSION: No evidence of deep venous thrombosis in either lower extremity. Electronically Signed   By: Lynwood Landy Raddle M.D.   On: 05/17/2024 13:51   CT Chest W Contrast Result Date: 05/14/2024 CLINICAL DATA:  Small  cell lung cancer, staging. * Tracking Code: BO * . EXAM: CT CHEST WITH CONTRAST TECHNIQUE: Multidetector CT imaging of the chest was performed during intravenous contrast administration. RADIATION DOSE REDUCTION: This exam was performed according to the departmental dose-optimization program which includes automated exposure control, adjustment of the mA and/or kV according to patient size and/or use of iterative reconstruction technique. CONTRAST:  75mL OMNIPAQUE  IOHEXOL  300 MG/ML  SOLN COMPARISON:  Multiple priors including CT February 13, 2024. FINDINGS: Cardiovascular: Aortic atherosclerosis. Coronary artery calcifications. Normal size heart. Questionable thinning with hypodense appearance of the endocardium along the left ventricular apex. Mediastinum/Nodes: No suspicious thyroid  nodule. Stable prominent right hilar lymph nodes/nodal tissue measuring 8 mm in short axis on image 66/2. Similar mild esophageal wall thickening, likely post treatment related. Lungs/Pleura: Progressive confluence of thick interstitial bands and ground-glass in the right upper lobe with associated architectural distortion/traction bronchiectasis. This includes a few nodular areas of consolidation for instance measuring 13 mm on image 57/8 which are new from prior examination. Near complete resolution of the anterior left upper lobe ground-glass now with some subpleural reticulations in its place. Stable 5 mm right middle lobe pulmonary nodule on image 108/8. Similar trace right pleural effusion. Upper Abdomen: No acute abnormality. Musculoskeletal: No aggressive lytic or blastic lesion of bone. Similar mild T4 superior endplate compression. Chronic L2 compression deformity unchanged. IMPRESSION: 1. Progressive confluence of thick interstitial bands and ground-glass in the right upper lobe with associated architectural distortion/traction bronchiectasis. This includes a few nodular areas of consolidation for instance measuring 13 mm which  are new from prior examination. Findings are favored to reflect evolving postradiation change. Suggest continued attention on follow-up imaging. 2. Near complete resolution of the anterior left upper lobe ground-glass now with some subpleural reticulations in its place, also favored to reflect post radiation scarring. 3. Stable 5 mm right middle lobe pulmonary nodule. 4. Stable prominent right hilar lymph nodes/nodal tissue measuring 8 mm in short axis. 5. Questionable thinning with hypodense appearance of the endocardium along the left ventricular apex, suggest correlation with EKG for infarction. 6.  Aortic Atherosclerosis (ICD10-I70.0). Electronically Signed   By: Reyes Holder M.D.   On: 05/14/2024 15:17    ASSESSMENT AND PLAN: This is a very pleasant 68 years old white female with Limited stage small cell lung cancer (T1c, N2, M0) small cell lung cancer. The patient presented with a hilar mass and right paratracheal lymphadenopathy. She was diagnosed in October 2024. The patient underwent systemic chemotherapy with cisplatin  60 Mg/M2 on day 1 and etoposide  90 Mg/M2 on days 1, 2 and 3 status post 4 cycles.  This was concurrent with radiotherapy for 6 weeks. The patient tolerated her treatment well except for the pancytopenia. She did not receive prophylactic cranial irradiation secondary to her poor condition after the treatment. She is currently on observation. She had repeat CT scan of the chest performed recently.  I personally and  independently reviewed the scan and discussed the result with the patient and her daughter.  Her scan showed no concerning findings for disease recurrence or metastasis. Assessment and Plan Assessment & Plan Limited stage small cell lung cancer, post-chemoradiation CT scan shows no evidence of active disease, with resolution of ground glass opacity in the left upper lobe. Scarring from radiation is present, but the cancer appears well-controlled. - Schedule follow-up  CT scan of the chest in three months.  Radiation-induced pulmonary fibrosis Scarring from radiation is present, but no active disease is noted on CT scan. Breathing is good, with no significant issues unless exerted.  Anemia secondary to chemotherapy Blood counts remain low post-chemotherapy, likely due to bone marrow suppression. No symptoms such as bleeding, bruising, or epistaxis are reported. - Monitor blood counts and symptoms. - Allow time for bone marrow recovery.  Chronic lower extremity edema Persistent leg swelling is present. Previous evaluation for blood clots was negative.  Chronic cough A wet-sounding cough is present, but breathing is generally good.  Dysphagia, status post esophageal dilation Difficulty swallowing persists, with a desire for another esophageal dilation. Previous dilation resulted in complications, including double pneumonia. Gastroenterologist requires consent for the procedure. - Consent to esophageal dilation by gastroenterologist.  Possible cardiac abnormality (left ventricular apex) CT scan shows questionable thinning with a hypodense appearance of the endocardium along the left ventricular apex. Need to rule out cardiac issues such as myocardial infarction. - Perform EKG to assess for cardiac abnormalities. - Provide a copy of the CT report to the primary care physician for further evaluation. She was advised to call immediately if she has any concerning symptoms in the interval.  The patient voices understanding of current disease status and treatment options and is in agreement with the current care plan.  All questions were answered. The patient knows to call the clinic with any problems, questions or concerns. We can certainly see the patient much sooner if necessary. The total time spent in the appointment was 30 minutes including review of chart and various tests results, discussions about plan of care and coordination of care plan  .  Disclaimer: This note was dictated with voice recognition software. Similar sounding words can inadvertently be transcribed and may not be corrected upon review.

## 2024-05-23 ENCOUNTER — Other Ambulatory Visit: Payer: Self-pay

## 2024-06-02 ENCOUNTER — Inpatient Hospital Stay (HOSPITAL_COMMUNITY)

## 2024-06-02 ENCOUNTER — Inpatient Hospital Stay (HOSPITAL_COMMUNITY)
Admission: EM | Admit: 2024-06-02 | Discharge: 2024-06-18 | DRG: 871 | Disposition: E | Attending: Internal Medicine | Admitting: Internal Medicine

## 2024-06-02 ENCOUNTER — Emergency Department (HOSPITAL_COMMUNITY)

## 2024-06-02 ENCOUNTER — Encounter (HOSPITAL_COMMUNITY): Payer: Self-pay | Admitting: Internal Medicine

## 2024-06-02 DIAGNOSIS — R0902 Hypoxemia: Secondary | ICD-10-CM

## 2024-06-02 DIAGNOSIS — Z5309 Procedure and treatment not carried out because of other contraindication: Secondary | ICD-10-CM | POA: Diagnosis present

## 2024-06-02 DIAGNOSIS — Z66 Do not resuscitate: Secondary | ICD-10-CM | POA: Diagnosis present

## 2024-06-02 DIAGNOSIS — D61818 Other pancytopenia: Secondary | ICD-10-CM

## 2024-06-02 DIAGNOSIS — Z881 Allergy status to other antibiotic agents status: Secondary | ICD-10-CM

## 2024-06-02 DIAGNOSIS — C3490 Malignant neoplasm of unspecified part of unspecified bronchus or lung: Secondary | ICD-10-CM | POA: Diagnosis present

## 2024-06-02 DIAGNOSIS — E43 Unspecified severe protein-calorie malnutrition: Secondary | ICD-10-CM | POA: Diagnosis present

## 2024-06-02 DIAGNOSIS — Y842 Radiological procedure and radiotherapy as the cause of abnormal reaction of the patient, or of later complication, without mention of misadventure at the time of the procedure: Secondary | ICD-10-CM | POA: Diagnosis present

## 2024-06-02 DIAGNOSIS — Z79899 Other long term (current) drug therapy: Secondary | ICD-10-CM

## 2024-06-02 DIAGNOSIS — J189 Pneumonia, unspecified organism: Secondary | ICD-10-CM | POA: Diagnosis present

## 2024-06-02 DIAGNOSIS — T451X5A Adverse effect of antineoplastic and immunosuppressive drugs, initial encounter: Secondary | ICD-10-CM | POA: Diagnosis present

## 2024-06-02 DIAGNOSIS — A419 Sepsis, unspecified organism: Secondary | ICD-10-CM | POA: Diagnosis present

## 2024-06-02 DIAGNOSIS — N179 Acute kidney failure, unspecified: Secondary | ICD-10-CM

## 2024-06-02 DIAGNOSIS — I739 Peripheral vascular disease, unspecified: Secondary | ICD-10-CM | POA: Diagnosis present

## 2024-06-02 DIAGNOSIS — R64 Cachexia: Secondary | ICD-10-CM | POA: Diagnosis present

## 2024-06-02 DIAGNOSIS — I10 Essential (primary) hypertension: Secondary | ICD-10-CM | POA: Diagnosis present

## 2024-06-02 DIAGNOSIS — J9602 Acute respiratory failure with hypercapnia: Secondary | ICD-10-CM | POA: Diagnosis present

## 2024-06-02 DIAGNOSIS — I469 Cardiac arrest, cause unspecified: Secondary | ICD-10-CM | POA: Diagnosis present

## 2024-06-02 DIAGNOSIS — Z9841 Cataract extraction status, right eye: Secondary | ICD-10-CM

## 2024-06-02 DIAGNOSIS — R6521 Severe sepsis with septic shock: Secondary | ICD-10-CM | POA: Diagnosis present

## 2024-06-02 DIAGNOSIS — J690 Pneumonitis due to inhalation of food and vomit: Secondary | ICD-10-CM | POA: Diagnosis present

## 2024-06-02 DIAGNOSIS — Z8711 Personal history of peptic ulcer disease: Secondary | ICD-10-CM

## 2024-06-02 DIAGNOSIS — N170 Acute kidney failure with tubular necrosis: Secondary | ICD-10-CM | POA: Diagnosis present

## 2024-06-02 DIAGNOSIS — Z9842 Cataract extraction status, left eye: Secondary | ICD-10-CM

## 2024-06-02 DIAGNOSIS — Z8249 Family history of ischemic heart disease and other diseases of the circulatory system: Secondary | ICD-10-CM | POA: Diagnosis not present

## 2024-06-02 DIAGNOSIS — D61810 Antineoplastic chemotherapy induced pancytopenia: Secondary | ICD-10-CM | POA: Diagnosis present

## 2024-06-02 DIAGNOSIS — R627 Adult failure to thrive: Secondary | ICD-10-CM | POA: Diagnosis present

## 2024-06-02 DIAGNOSIS — Z96641 Presence of right artificial hip joint: Secondary | ICD-10-CM | POA: Diagnosis present

## 2024-06-02 DIAGNOSIS — R4182 Altered mental status, unspecified: Secondary | ICD-10-CM

## 2024-06-02 DIAGNOSIS — I21A1 Myocardial infarction type 2: Secondary | ICD-10-CM | POA: Diagnosis present

## 2024-06-02 DIAGNOSIS — K222 Esophageal obstruction: Secondary | ICD-10-CM | POA: Diagnosis present

## 2024-06-02 DIAGNOSIS — J8410 Pulmonary fibrosis, unspecified: Secondary | ICD-10-CM | POA: Diagnosis not present

## 2024-06-02 DIAGNOSIS — Z87891 Personal history of nicotine dependence: Secondary | ICD-10-CM

## 2024-06-02 DIAGNOSIS — Z923 Personal history of irradiation: Secondary | ICD-10-CM

## 2024-06-02 DIAGNOSIS — E871 Hypo-osmolality and hyponatremia: Secondary | ICD-10-CM | POA: Diagnosis present

## 2024-06-02 DIAGNOSIS — Z90710 Acquired absence of both cervix and uterus: Secondary | ICD-10-CM

## 2024-06-02 DIAGNOSIS — E162 Hypoglycemia, unspecified: Secondary | ICD-10-CM | POA: Diagnosis present

## 2024-06-02 DIAGNOSIS — Z886 Allergy status to analgesic agent status: Secondary | ICD-10-CM

## 2024-06-02 DIAGNOSIS — Z833 Family history of diabetes mellitus: Secondary | ICD-10-CM

## 2024-06-02 DIAGNOSIS — E785 Hyperlipidemia, unspecified: Secondary | ICD-10-CM | POA: Diagnosis present

## 2024-06-02 DIAGNOSIS — J701 Chronic and other pulmonary manifestations due to radiation: Secondary | ICD-10-CM | POA: Diagnosis present

## 2024-06-02 DIAGNOSIS — Z800 Family history of malignant neoplasm of digestive organs: Secondary | ICD-10-CM

## 2024-06-02 DIAGNOSIS — Z9221 Personal history of antineoplastic chemotherapy: Secondary | ICD-10-CM

## 2024-06-02 DIAGNOSIS — Z83719 Family history of colon polyps, unspecified: Secondary | ICD-10-CM

## 2024-06-02 DIAGNOSIS — I959 Hypotension, unspecified: Secondary | ICD-10-CM

## 2024-06-02 DIAGNOSIS — I4901 Ventricular fibrillation: Secondary | ICD-10-CM | POA: Diagnosis present

## 2024-06-02 DIAGNOSIS — M810 Age-related osteoporosis without current pathological fracture: Secondary | ICD-10-CM | POA: Diagnosis present

## 2024-06-02 DIAGNOSIS — Z823 Family history of stroke: Secondary | ICD-10-CM

## 2024-06-02 DIAGNOSIS — Z880 Allergy status to penicillin: Secondary | ICD-10-CM

## 2024-06-02 DIAGNOSIS — Z888 Allergy status to other drugs, medicaments and biological substances status: Secondary | ICD-10-CM

## 2024-06-02 DIAGNOSIS — J440 Chronic obstructive pulmonary disease with acute lower respiratory infection: Secondary | ICD-10-CM | POA: Diagnosis present

## 2024-06-02 DIAGNOSIS — R54 Age-related physical debility: Secondary | ICD-10-CM | POA: Diagnosis present

## 2024-06-02 DIAGNOSIS — J9601 Acute respiratory failure with hypoxia: Principal | ICD-10-CM | POA: Diagnosis present

## 2024-06-02 DIAGNOSIS — Z885 Allergy status to narcotic agent status: Secondary | ICD-10-CM

## 2024-06-02 LAB — I-STAT VENOUS BLOOD GAS, ED
Acid-base deficit: 13 mmol/L — ABNORMAL HIGH (ref 0.0–2.0)
Bicarbonate: 17.1 mmol/L — ABNORMAL LOW (ref 20.0–28.0)
Calcium, Ion: 0.95 mmol/L — ABNORMAL LOW (ref 1.15–1.40)
HCT: 27 % — ABNORMAL LOW (ref 36.0–46.0)
Hemoglobin: 9.2 g/dL — ABNORMAL LOW (ref 12.0–15.0)
O2 Saturation: 77 %
Potassium: 3.6 mmol/L (ref 3.5–5.1)
Sodium: 134 mmol/L — ABNORMAL LOW (ref 135–145)
TCO2: 19 mmol/L — ABNORMAL LOW (ref 22–32)
pCO2, Ven: 59.9 mmHg (ref 44–60)
pH, Ven: 7.064 — CL (ref 7.25–7.43)
pO2, Ven: 59 mmHg — ABNORMAL HIGH (ref 32–45)

## 2024-06-02 LAB — I-STAT ARTERIAL BLOOD GAS, ED
Acid-base deficit: 17 mmol/L — ABNORMAL HIGH (ref 0.0–2.0)
Bicarbonate: 12.7 mmol/L — ABNORMAL LOW (ref 20.0–28.0)
Calcium, Ion: 0.95 mmol/L — ABNORMAL LOW (ref 1.15–1.40)
HCT: 21 % — ABNORMAL LOW (ref 36.0–46.0)
Hemoglobin: 7.1 g/dL — ABNORMAL LOW (ref 12.0–15.0)
O2 Saturation: 99 %
Patient temperature: 95.1
Potassium: 3.8 mmol/L (ref 3.5–5.1)
Sodium: 133 mmol/L — ABNORMAL LOW (ref 135–145)
TCO2: 14 mmol/L — ABNORMAL LOW (ref 22–32)
pCO2 arterial: 43 mmHg (ref 32–48)
pH, Arterial: 7.065 — CL (ref 7.35–7.45)
pO2, Arterial: 218 mmHg — ABNORMAL HIGH (ref 83–108)

## 2024-06-02 LAB — COMPREHENSIVE METABOLIC PANEL WITH GFR
ALT: 10 U/L (ref 0–44)
AST: 46 U/L — ABNORMAL HIGH (ref 15–41)
Albumin: <1.5 g/dL — ABNORMAL LOW (ref 3.5–5.0)
Alkaline Phosphatase: 34 U/L — ABNORMAL LOW (ref 38–126)
Anion gap: 11 (ref 5–15)
BUN: 21 mg/dL (ref 8–23)
CO2: 14 mmol/L — ABNORMAL LOW (ref 22–32)
Calcium: 6 mg/dL — CL (ref 8.9–10.3)
Chloride: 106 mmol/L (ref 98–111)
Creatinine, Ser: 1.4 mg/dL — ABNORMAL HIGH (ref 0.44–1.00)
GFR, Estimated: 41 mL/min — ABNORMAL LOW (ref >60)
Glucose, Bld: 278 mg/dL — ABNORMAL HIGH (ref 70–99)
Potassium: 3.5 mmol/L (ref 3.5–5.1)
Sodium: 131 mmol/L — ABNORMAL LOW (ref 135–145)
Total Bilirubin: 0.3 mg/dL (ref 0.0–1.2)
Total Protein: <3 g/dL — ABNORMAL LOW (ref 6.5–8.1)

## 2024-06-02 LAB — I-STAT CHEM 8, ED
BUN: 25 mg/dL — ABNORMAL HIGH (ref 8–23)
Calcium, Ion: 0.86 mmol/L — CL (ref 1.15–1.40)
Chloride: 103 mmol/L (ref 98–111)
Creatinine, Ser: 1.4 mg/dL — ABNORMAL HIGH (ref 0.44–1.00)
Glucose, Bld: 54 mg/dL — ABNORMAL LOW (ref 70–99)
HCT: 28 % — ABNORMAL LOW (ref 36.0–46.0)
Hemoglobin: 9.5 g/dL — ABNORMAL LOW (ref 12.0–15.0)
Potassium: 3.6 mmol/L (ref 3.5–5.1)
Sodium: 133 mmol/L — ABNORMAL LOW (ref 135–145)
TCO2: 18 mmol/L — ABNORMAL LOW (ref 22–32)

## 2024-06-02 LAB — I-STAT CG4 LACTIC ACID, ED: Lactic Acid, Venous: 2.6 mmol/L (ref 0.5–1.9)

## 2024-06-02 LAB — CBC WITH DIFFERENTIAL/PLATELET
Abs Immature Granulocytes: 0 K/uL (ref 0.00–0.07)
Basophils Absolute: 0 K/uL (ref 0.0–0.1)
Basophils Relative: 8 %
Eosinophils Absolute: 0 K/uL (ref 0.0–0.5)
Eosinophils Relative: 0 %
HCT: 22.4 % — ABNORMAL LOW (ref 36.0–46.0)
Hemoglobin: 7.4 g/dL — ABNORMAL LOW (ref 12.0–15.0)
Immature Granulocytes: 0 %
Lymphocytes Relative: 53 %
Lymphs Abs: 0.1 K/uL — ABNORMAL LOW (ref 0.7–4.0)
MCH: 42.3 pg — ABNORMAL HIGH (ref 26.0–34.0)
MCHC: 33 g/dL (ref 30.0–36.0)
MCV: 128 fL — ABNORMAL HIGH (ref 80.0–100.0)
Monocytes Absolute: 0 K/uL — ABNORMAL LOW (ref 0.1–1.0)
Monocytes Relative: 8 %
Neutro Abs: 0 K/uL — CL (ref 1.7–7.7)
Neutrophils Relative %: 31 %
Platelets: 8 K/uL — CL (ref 150–400)
RBC: 1.75 MIL/uL — ABNORMAL LOW (ref 3.87–5.11)
RDW: 18.9 % — ABNORMAL HIGH (ref 11.5–15.5)
WBC: 0.1 K/uL — CL (ref 4.0–10.5)
nRBC: 53.8 % — ABNORMAL HIGH (ref 0.0–0.2)

## 2024-06-02 LAB — TYPE AND SCREEN
Unit division: 0
Unit division: 0

## 2024-06-02 LAB — URINALYSIS, W/ REFLEX TO CULTURE (INFECTION SUSPECTED)
Bilirubin Urine: NEGATIVE
Glucose, UA: NEGATIVE mg/dL
Ketones, ur: NEGATIVE mg/dL
Leukocytes,Ua: NEGATIVE
Nitrite: NEGATIVE
Protein, ur: 100 mg/dL — AB
Specific Gravity, Urine: 1.02 (ref 1.005–1.030)
pH: 5 (ref 5.0–8.0)

## 2024-06-02 LAB — BPAM RBC
Blood Product Expiration Date: 202509142359
Blood Product Expiration Date: 202509142359
ISSUE DATE / TIME: 202508161432
ISSUE DATE / TIME: 202508161432
Unit Type and Rh: 5100
Unit Type and Rh: 5100

## 2024-06-02 LAB — CBG MONITORING, ED
Glucose-Capillary: 134 mg/dL — ABNORMAL HIGH (ref 70–99)
Glucose-Capillary: 72 mg/dL (ref 70–99)
Glucose-Capillary: 114 mg/dL — ABNORMAL HIGH (ref 70–99)

## 2024-06-02 LAB — PROTIME-INR
INR: 3.3 — ABNORMAL HIGH (ref 0.8–1.2)
Prothrombin Time: 34.8 s — ABNORMAL HIGH (ref 11.4–15.2)

## 2024-06-02 LAB — PREPARE RBC (CROSSMATCH)

## 2024-06-02 LAB — TROPONIN I (HIGH SENSITIVITY): Troponin I (High Sensitivity): 58 ng/L — ABNORMAL HIGH (ref <18)

## 2024-06-02 MED ORDER — SODIUM CHLORIDE 0.9% IV SOLUTION: Freq: Once | INTRAVENOUS | Status: DC

## 2024-06-02 MED ORDER — DOCUSATE SODIUM 50 MG/5ML PO LIQD: 100.0000 mg | Freq: Two times a day (BID) | ORAL | Status: DC | PRN

## 2024-06-02 MED ORDER — ROCURONIUM BROMIDE 10 MG/ML (PF) SYRINGE
PREFILLED_SYRINGE | INTRAVENOUS | Status: AC | PRN
  Administered 2024-06-02: 60 mg via INTRAVENOUS

## 2024-06-02 MED ORDER — ATROPINE SULFATE 1 MG/10ML IJ SOSY
PREFILLED_SYRINGE | INTRAMUSCULAR | Status: AC | PRN
  Administered 2024-06-02: 1 mg via INTRAVENOUS

## 2024-06-02 MED ORDER — SODIUM BICARBONATE 8.4 % IV SOLN
INTRAVENOUS | Status: AC | PRN
  Administered 2024-06-02: 50 meq via INTRAVENOUS

## 2024-06-02 MED ORDER — EPINEPHRINE 1 MG/10ML IJ SOSY
PREFILLED_SYRINGE | INTRAMUSCULAR | Status: AC | PRN
  Administered 2024-06-02: 1 mg via INTRAVENOUS

## 2024-06-02 MED ORDER — VANCOMYCIN HCL IN DEXTROSE 1-5 GM/200ML-% IV SOLN: 1000.0000 mg | Freq: Once | INTRAVENOUS | Status: DC

## 2024-06-02 MED ORDER — NOREPINEPHRINE 4 MG/250ML-% IV SOLN
0.0000 ug/min | INTRAVENOUS | Status: DC
  Administered 2024-06-02: 25 ug/min via INTRAVENOUS

## 2024-06-02 MED ORDER — SODIUM CHLORIDE 0.9% IV SOLUTION: Freq: Once | INTRAVENOUS | Status: AC

## 2024-06-02 MED ORDER — METHYLENE BLUE (ANTIDOTE) 1 % IV SOLN
1.0000 mg/kg | Freq: Once | INTRAVENOUS | Status: DC
  Filled 2024-06-02 (×2): qty 3.7

## 2024-06-02 MED ORDER — POLYETHYLENE GLYCOL 3350 17 G PO PACK: 17.0000 g | PACK | Freq: Every day | ORAL | Status: DC | PRN

## 2024-06-02 MED ORDER — CALCIUM CHLORIDE 10 % IV SOLN
INTRAVENOUS | Status: AC | PRN
  Administered 2024-06-02: 1 g via INTRAVENOUS

## 2024-06-02 MED ORDER — SODIUM CHLORIDE 0.9 % IV SOLN: 250.0000 mL | INTRAVENOUS | Status: DC

## 2024-06-02 MED ORDER — FENTANYL CITRATE PF 50 MCG/ML IJ SOSY
25.0000 ug | PREFILLED_SYRINGE | Freq: Once | INTRAMUSCULAR | Status: DC
  Filled 2024-06-02: qty 1

## 2024-06-02 MED ORDER — PHENYLEPHRINE 80 MCG/ML (10ML) SYRINGE FOR IV PUSH (FOR BLOOD PRESSURE SUPPORT): 160.0000 ug | PREFILLED_SYRINGE | Freq: Once | INTRAVENOUS | Status: DC

## 2024-06-02 MED ORDER — CHLORHEXIDINE GLUCONATE CLOTH 2 % EX PADS: 6.0000 | MEDICATED_PAD | Freq: Every day | CUTANEOUS | Status: DC

## 2024-06-02 MED ORDER — EPINEPHRINE 1 MG/10ML IJ SOSY
PREFILLED_SYRINGE | INTRAMUSCULAR | Status: AC
  Filled 2024-06-02: qty 10

## 2024-06-02 MED ORDER — CALCIUM GLUCONATE-NACL 2-0.675 GM/100ML-% IV SOLN
2.0000 g | Freq: Once | INTRAVENOUS | Status: DC
  Filled 2024-06-02: qty 100

## 2024-06-02 MED ORDER — HYDROCORTISONE SOD SUC (PF) 100 MG IJ SOLR: 100.0000 mg | Freq: Two times a day (BID) | INTRAMUSCULAR | Status: DC

## 2024-06-02 MED ORDER — SODIUM CHLORIDE 0.9 % IV SOLN: 2.0000 g | Freq: Once | INTRAVENOUS | Status: DC

## 2024-06-02 MED ORDER — FENTANYL 2500MCG IN NS 250ML (10MCG/ML) PREMIX INFUSION
0.0000 ug/h | INTRAVENOUS | Status: DC
  Filled 2024-06-02: qty 250

## 2024-06-02 MED ORDER — VASOPRESSIN 20 UNITS/100 ML INFUSION FOR SHOCK
0.0000 [IU]/min | INTRAVENOUS | Status: DC
  Administered 2024-06-02: 0.03 [IU]/min via INTRAVENOUS
  Filled 2024-06-02: qty 100

## 2024-06-02 MED ORDER — SODIUM CHLORIDE 0.9 % IV SOLN: 2.0000 g | INTRAVENOUS | Status: DC

## 2024-06-02 MED ORDER — DEXTROSE 50 % IV SOLN
INTRAVENOUS | Status: AC
  Filled 2024-06-02: qty 50

## 2024-06-02 MED ORDER — FENTANYL BOLUS VIA INFUSION: 25.0000 ug | INTRAVENOUS | Status: DC | PRN

## 2024-06-02 MED ORDER — ETOMIDATE 2 MG/ML IV SOLN
INTRAVENOUS | Status: AC | PRN
  Administered 2024-06-02: 20 mg via INTRAVENOUS

## 2024-06-02 MED ORDER — NALOXONE HCL 2 MG/2ML IJ SOSY
1.0000 mg | PREFILLED_SYRINGE | Freq: Once | INTRAMUSCULAR | Status: AC
  Administered 2024-06-02: 1 mg via INTRAVENOUS

## 2024-06-02 MED ORDER — SODIUM BICARBONATE 8.4 % IV SOLN
100.0000 meq | Freq: Once | INTRAVENOUS | Status: AC
  Administered 2024-06-02: 100 meq via INTRAVENOUS
  Filled 2024-06-02: qty 100

## 2024-06-03 LAB — BLOOD CULTURE ID PANEL (REFLEXED) - BCID2

## 2024-06-03 LAB — BPAM PLATELET PHERESIS
Blood Product Expiration Date: 202508172359
Blood Product Expiration Date: 202508172359
ISSUE DATE / TIME: 202508161432
ISSUE DATE / TIME: 202508161432
Unit Type and Rh: 6200
Unit Type and Rh: 7300

## 2024-06-03 LAB — PREPARE PLATELET PHERESIS
Unit division: 0
Unit division: 0

## 2024-06-05 LAB — CULTURE, BLOOD (ROUTINE X 2)

## 2024-06-18 NOTE — Progress Notes (Signed)
 Emergent release and administration of 1 unit platelets 1440 -1500   [id number] B+

## 2024-06-18 NOTE — Progress Notes (Signed)
   06/19/24 1600  Spiritual Encounters  Type of Visit Initial  Care provided to: Family  Referral source Clinical staff  Reason for visit Patient death  OnCall Visit Yes  Interventions  Spiritual Care Interventions Made Established relationship of care and support;Compassionate presence;Reflective listening;Normalization of emotions;Prayer;Encouragement;Supported grief process   Chaplain spiritual support services remain available as the need arises.

## 2024-06-18 NOTE — Procedures (Signed)
 Intraosseous Needle Insertion Procedure Note    Date:June 22, 2024  Time:2:16 PM   Provider Performing:Governor Matos   Procedure: Insertion Intraosseous (63319)  Indication(s) Medication administration  Consent Unable to obtain consent due to emergent nature of procedure.  Anesthesia Topical only with 1% lidocaine    Timeout Verified patient identification, verified procedure, site/side was marked, verified correct patient position, special equipment/implants available, medications/allergies/relevant history reviewed, required imaging and test results available.  Procedure Description Area of needle insertion was cleaned with chlorhexidine . Intraosseous needle was placed into the right tibia. Bone marrow was aspirated and site easily flushed. The needle was secured in place and dressing applied.  Complications/Tolerance None; patient tolerated the procedure well.  EBL Minimal

## 2024-06-18 NOTE — Death Summary Note (Signed)
 DEATH SUMMARY   Patient Details  Name: [name] MRN: [id number] DOB: 1955/11/06  Admission/Discharge Information   Admit Date:  06-23-2024  Date of Death:    Time of Death:    Length of Stay: 0  Referring Physician: [name], MD   Reason(s) for Hospitalization  Cause of death: small cell lung cancer x 10 months  Diagnoses  Preliminary cause of death:  Secondary Diagnoses (including complications and co-morbidities):  Principal Problem:   Acute hypoxic respiratory failure (HCC) Active Problems:   Septic shock (HCC) Presumed neutropenic sepsis Severe protein calorie malnutrition POA Acute kidney injury Type II NSTEMI   Brief Hospital Course (including significant findings, care, treatment, and services provided and events leading to death)  68 year old female with limited small cell lung cancer status post chemoradiation who presented after she was found unresponsive, hypoglycemic, and hypoxic. In the emergency department, she was intubated, remained hypotensive after IV fluid resuscitation, started on vasopressor support, PCCM was consulted for help evaluation medical management   She was pancytopenic and quickly maxed on pressors on arrival to ICU.  Within an hour of arrival she went into asystole and could not be revived.    Pertinent Labs and Studies  Significant Diagnostic Studies DG Chest Port 1 View if patient is in a treatment room. Result Date: 2024-06-23 CLINICAL DATA:  Suspected Sepsis EXAM: PORTABLE CHEST 1 VIEW COMPARISON:  Chest x-ray 03/11/2024, CT chest 05/14/2024 FINDINGS: Patient is rotated. The heart and mediastinal contours are unchanged. Atherosclerotic plaque. Right upper lobe patchy airspace opacities. Chronic coarsened interstitial markings with no overt pulmonary edema. No pleural effusion. No pneumothorax. No acute osseous abnormality.  Old healed left rib fractures. IMPRESSION: 1. Right upper lobe patchy airspace opacities. Followup  PA and lateral chest X-ray is recommended in 3-4 weeks following therapy to ensure resolution and exclude underlying malignancy. 2. Aortic Atherosclerosis (ICD10-I70.0) and Emphysema (ICD10-J43.9). Electronically Signed   By: Morgane  Naveau M.D.   On: 06/23/24 12:54   US  Venous Img Lower Bilateral (DVT) Result Date: 05/17/2024 CLINICAL DATA:  Bilateral lower extremity edema. EXAM: BILATERAL LOWER EXTREMITY VENOUS DOPPLER ULTRASOUND TECHNIQUE: Gray-scale sonography with graded compression, as well as color Doppler and duplex ultrasound were performed to evaluate the lower extremity deep venous systems from the level of the common femoral vein and including the common femoral, femoral, profunda femoral, popliteal and calf veins including the posterior tibial, peroneal and gastrocnemius veins when visible. The superficial great saphenous vein was also interrogated. Spectral Doppler was utilized to evaluate flow at rest and with distal augmentation maneuvers in the common femoral, femoral and popliteal veins. COMPARISON:  None Available. FINDINGS: RIGHT LOWER EXTREMITY Common Femoral Vein: No evidence of thrombus. Normal compressibility, respiratory phasicity and response to augmentation. Saphenofemoral Junction: No evidence of thrombus. Normal compressibility and flow on color Doppler imaging. Profunda Femoral Vein: No evidence of thrombus. Normal compressibility and flow on color Doppler imaging. Femoral Vein: No evidence of thrombus. Normal compressibility, respiratory phasicity and response to augmentation. Popliteal Vein: No evidence of thrombus. Normal compressibility, respiratory phasicity and response to augmentation. Calf Veins: No evidence of thrombus. Normal compressibility and flow on color Doppler imaging. Superficial Great Saphenous Vein: No evidence of thrombus. Normal compressibility. Venous Reflux:  None. Other Findings:  None. LEFT LOWER EXTREMITY Common Femoral Vein: No evidence of thrombus.  Normal compressibility, respiratory phasicity and response to augmentation. Saphenofemoral Junction: No evidence of thrombus. Normal compressibility and flow on color Doppler imaging. Profunda Femoral Vein: No evidence  of thrombus. Normal compressibility and flow on color Doppler imaging. Femoral Vein: No evidence of thrombus. Normal compressibility, respiratory phasicity and response to augmentation. Popliteal Vein: No evidence of thrombus. Normal compressibility, respiratory phasicity and response to augmentation. Calf Veins: No evidence of thrombus. Normal compressibility and flow on color Doppler imaging. Superficial Great Saphenous Vein: No evidence of thrombus. Normal compressibility. Venous Reflux:  None. Other Findings:  None. IMPRESSION: No evidence of deep venous thrombosis in either lower extremity. Electronically Signed   By: Lynwood Landy Raddle M.D.   On: 05/17/2024 13:51   CT Chest W Contrast Result Date: 05/14/2024 CLINICAL DATA:  Small cell lung cancer, staging. * Tracking Code: BO * . EXAM: CT CHEST WITH CONTRAST TECHNIQUE: Multidetector CT imaging of the chest was performed during intravenous contrast administration. RADIATION DOSE REDUCTION: This exam was performed according to the departmental dose-optimization program which includes automated exposure control, adjustment of the mA and/or kV according to patient size and/or use of iterative reconstruction technique. CONTRAST:  75mL OMNIPAQUE  IOHEXOL  300 MG/ML  SOLN COMPARISON:  Multiple priors including CT February 13, 2024. FINDINGS: Cardiovascular: Aortic atherosclerosis. Coronary artery calcifications. Normal size heart. Questionable thinning with hypodense appearance of the endocardium along the left ventricular apex. Mediastinum/Nodes: No suspicious thyroid  nodule. Stable prominent right hilar lymph nodes/nodal tissue measuring 8 mm in short axis on image 66/2. Similar mild esophageal wall thickening, likely post treatment related. Lungs/Pleura:  Progressive confluence of thick interstitial bands and ground-glass in the right upper lobe with associated architectural distortion/traction bronchiectasis. This includes a few nodular areas of consolidation for instance measuring 13 mm on image 57/8 which are new from prior examination. Near complete resolution of the anterior left upper lobe ground-glass now with some subpleural reticulations in its place. Stable 5 mm right middle lobe pulmonary nodule on image 108/8. Similar trace right pleural effusion. Upper Abdomen: No acute abnormality. Musculoskeletal: No aggressive lytic or blastic lesion of bone. Similar mild T4 superior endplate compression. Chronic L2 compression deformity unchanged. IMPRESSION: 1. Progressive confluence of thick interstitial bands and ground-glass in the right upper lobe with associated architectural distortion/traction bronchiectasis. This includes a few nodular areas of consolidation for instance measuring 13 mm which are new from prior examination. Findings are favored to reflect evolving postradiation change. Suggest continued attention on follow-up imaging. 2. Near complete resolution of the anterior left upper lobe ground-glass now with some subpleural reticulations in its place, also favored to reflect post radiation scarring. 3. Stable 5 mm right middle lobe pulmonary nodule. 4. Stable prominent right hilar lymph nodes/nodal tissue measuring 8 mm in short axis. 5. Questionable thinning with hypodense appearance of the endocardium along the left ventricular apex, suggest correlation with EKG for infarction. 6.  Aortic Atherosclerosis (ICD10-I70.0). Electronically Signed   By: Reyes Holder M.D.   On: 05/14/2024 15:17    Microbiology No results found for this or any previous visit (from the past 240 hours).  Lab Basic Metabolic Panel: Recent Labs  Lab 06-28-2024 1205 06-28-2024 1208 06/28/24 1209 2024-06-28 1331  NA 131* 133* 134* 133*  K 3.5 3.6 3.6 3.8  CL 106 103   --   --   CO2 14*  --   --   --   GLUCOSE 278* 54*  --   --   BUN 21 25*  --   --   CREATININE 1.40* 1.40*  --   --   CALCIUM  6.0*  --   --   --    Liver Function  Tests: Recent Labs  Lab 2024/06/13 1205  AST 46*  ALT 10  ALKPHOS 34*  BILITOT 0.3  PROT <3.0*  ALBUMIN <1.5*   No results for input(s): LIPASE, AMYLASE in the last 168 hours. No results for input(s): AMMONIA in the last 168 hours. CBC: Recent Labs  Lab 06-13-24 1205 06-13-24 1208 Jun 13, 2024 1209 Jun 13, 2024 1331  WBC 0.1*  --   --   --   NEUTROABS 0.0*  --   --   --   HGB 7.4* 9.5* 9.2* 7.1*  HCT 22.4* 28.0* 27.0* 21.0*  MCV 128.0*  --   --   --   PLT 8*  --   --   --    Cardiac Enzymes: No results for input(s): CKTOTAL, CKMB, CKMBINDEX, TROPONINI in the last 168 hours. Sepsis Labs: Recent Labs  Lab 2024/06/13 1205 2024/06/13 1209  WBC 0.1*  --   LATICACIDVEN  --  2.6*     Toribio JAYSON Sharps 13-Jun-2024, 3:10 PM

## 2024-06-18 NOTE — H&P (Signed)
 NAME:  JULYANNA SCHOLLE, MRN:  [id number], DOB:  Jan 27, 1956, LOS: 0 ADMISSION DATE:  2024-06-17, CONSULTATION DATE:  06/17/2024 REFERRING MD:  CHARM Galloway - EDP , CHIEF COMPLAINT: Acute hypoxic .namerespiratory failure  History of Present Illness:  [name] is a 68 year old female with a past medical history significant for small cell lung cancer s/p chemo and radiation, COPD, smoker, HTN, HLD, PVD, dysphagia with esophageal strictures s/p multiple esophageal dilations, and GERD who presented to the ED at [facility] 2024-06-17 after being found minimally responsive by family.  On EMS arrival patient was found with agonal respirations and blood sugar 20.  Bag mask ventilations provided and route, emergently intubated on ED arrival.  On admission to ED patient was found hypothermic, tachypneic, tachycardic, and hypotensive.  CBC significant for WBC 0.1 hemoglobin 7.4, platelets 8 with positive lactic acid of 2.6.  Be met pending at time of evaluation  Pertinent  Medical History  Small cell lung cancer s/p chemo and radiation, COPD, smoker, HTN, HLD, PVD, dysphagia with esophageal strictures s/p multiple esophageal dilations, and GERD  Significant Hospital Events: Including procedures, antibiotic start and stop dates in addition to other pertinent events   2024-06-17 admitted for being found minimally responsive by family workup on admission significant for septic shock likely secondary to pneumonia  Interim History / Subjective:  Unresponsive on ventilator  Objective    Blood pressure 90/65, pulse (!) 117, resp. rate 18, SpO2 100%.    Vent Mode: PRVC FiO2 (%):  [100 %] 100 % Set Rate:  [18 bmp] 18 bmp Vt Set:  [380 mL] 380 mL PEEP:  [5 cmH20] 5 cmH20 Plateau Pressure:  [20 cmH20] 20 cmH20  No intake or output data in the 24 hours ending 2024/06/17 1303 There were no vitals filed for this visit.  Examination: General: Acute on chronic severely cachectic deconditioned adult female lying in bed  on mechanical ventilation no acute distress  HEENT: ETT, MM pink/moist, PERRL,  Neuro: Unresponsive on vent CV: s1s2 regular rate and rhythm, no murmur, rubs, or gallops,  PULM: Significant bilateral rhonchi, FiO2 100%, tolerating ventilator GI: soft, bowel sounds active in all 4 quadrants, non-tender, non-distended Extremities: warm/dry, no edema  Skin: no rashes or lesions   Resolved problem list   Assessment and Plan  Acute hypoxic respiratory failure in the setting of Small cell lung cancer undergoing s/p chemotherapy and radiation Radiation-induced pulmonary fibrosis History of COPD Concern for underlying aspiration given history of esophageal stricture requiring dilation P: Continue ventilator support with lung protective strategies  Wean PEEP and FiO2 for sats greater than 90%. Head of bed elevated 30 degrees. Plateau pressures less than 30 cm H20.  Follow intermittent chest x-ray and ABG.   SAT/SBT as tolerated, mentation preclude extubation  Ensure adequate pulmonary hygiene  Follow cultures  VAP bundle in place  PAD protocol Empiric cefepime   Sepsis shock  -Patient was seen hypothermic, tachypneic and tachycardic in the setting of severe leukopenia and positive lactic acid with acute concern for pneumonia P: Admit ICU Support as above IV Pan cultures prior to antibiotic IV cefepime   Aggressive IV hydration Pressors for MAP goal greater than 65 Trend lactic acid Urine output  History of dysphagia with esophageal strictures s/p multiple esophageal dilations GERD P: Attempted to place OG to facilitate feeds given severe cachectic state Aspiration precautions SLP eval when appropriate  Acute Kidney Injury  - [place]ement pending at time of my evaluation but creatinine on i-STAT 1.40 P: Follow  renal function  Monitor urine output Trend Bmet Avoid nephrotoxins Ensure adequate renal perfusion  IV hydration  Significant pancytopenia - WBC 0.1 and platelets  8 P: Transfuse 1 unit PRBCs Transfused 2 units platelets Trend CBC Transfuse per protocol Hemoglobin goal greater than 7 Platelet goal greater than 10  Concern for decreased blood flow to bilateral extremities right greater than left - Pedal pulses not palpable bilaterally with cyanosis seen to left lower extremities P: Continue current medical management of septic shock once more stabilized consider obtaining ABIs  Failure to thrive Severe cachexia P: Trial to place OG to facilitate tube feeds RD consult  Goals of care Daughter states patient has told her in the past she would not want any prolonged aggressive interventions and quality of life was limited.  She also indicated patient would not want chest compressions given frailty at present.  CODE STATUS changed to limited  Best Practice (right click and Reselect all SmartList Selections daily)   Diet/type: NPO DVT prophylaxis SCD Pressure ulcer(s): N/A GI prophylaxis: PPI Lines: N/A Foley:  Yes, and it is still needed Code Status:  limited Last date of multidisciplinary goals of care discussion: Continue aggressive interventions but no chest compressions  Labs   CBC: Recent Labs  Lab June 26, 2024 1205 June 26, 2024 1208 06/26/2024 1209  WBC 0.1*  --   --   NEUTROABS PENDING  --   --   HGB 7.4* 9.5* 9.2*  HCT 22.4* 28.0* 27.0*  MCV 128.0*  --   --   PLT 8*  --   --     Basic Metabolic Panel: Recent Labs  Lab 06/26/24 1208 26-Jun-2024 1209  NA 133* 134*  K 3.6 3.6  CL 103  --   GLUCOSE 54*  --   BUN 25*  --   CREATININE 1.40*  --    GFR: Estimated Creatinine Clearance: 23.2 mL/min (A) (by C-G formula based on SCr of 1.4 mg/dL (H)). Recent Labs  Lab 06/26/2024 1205 06-26-2024 1209  WBC 0.1*  --   LATICACIDVEN  --  2.6*    Liver Function Tests: No results for input(s): AST, ALT, ALKPHOS, BILITOT, PROT, ALBUMIN in the last 168 hours. No results for input(s): LIPASE, AMYLASE in the last 168  hours. No results for input(s): AMMONIA in the last 168 hours.  ABG    Component Value Date/Time   PHART 7.41 03/11/2024 1319   PCO2ART 38 03/11/2024 1319   PO2ART 76 (L) 03/11/2024 1319   HCO3 17.1 (L) 2024-06-26 1209   TCO2 19 (L) 06-26-2024 1209   ACIDBASEDEF 13.0 (H) 06-26-2024 1209   O2SAT 77 06/26/2024 1209     Coagulation Profile: No results for input(s): INR, PROTIME in the last 168 hours.  Cardiac Enzymes: No results for input(s): CKTOTAL, CKMB, CKMBINDEX, TROPONINI in the last 168 hours.  HbA1C: Hgb A1c MFr Bld  Date/Time Value Ref Range Status  05/11/2023 09:30 AM 4.8 <5.7 % of total Hgb Final    Comment:    For the purpose of screening for the presence of diabetes: . <5.7%       Consistent with the absence of diabetes 5.7-6.4%    Consistent with increased risk for diabetes             (prediabetes) > or =6.5%  Consistent with diabetes . This assay result is consistent with a decreased risk of diabetes. . Currently, no consensus exists regarding use of hemoglobin A1c for diagnosis of diabetes in children. . According to [employer]  Diabetes Association (ADA) guidelines, hemoglobin A1c <7.0% represents optimal control in non-pregnant diabetic patients. Different metrics may apply to specific patient populations.  Standards of Medical Care in Diabetes(ADA). . . This test was performed on the Roche cobas c503 platform. Effective 07/26/22, a change in test platforms from the Abbott Architect to the Roche cobas c503 may have shifted HbA1c results compared to historical results. Based on laboratory validation testing conducted at [facility], the Roche platform relative to the Abbott platform had an average increase in HbA1c value of < or = 0.3%. This difference is within accepted  variability established by the [facility]. Note that not all individuals will have had a shift in their results and  direct comparisons between historical and current results for testing conducted on different platforms is not recommended.   03/18/2022 12:09 PM 5.0 <5.7 % of total Hgb Final    Comment:    For the purpose of screening for the presence of diabetes: . <5.7%       Consistent with the absence of diabetes 5.7-6.4%    Consistent with increased risk for diabetes             (prediabetes) > or =6.5%  Consistent with diabetes . This assay result is consistent with a decreased risk of diabetes. . Currently, no consensus exists regarding use of hemoglobin A1c for diagnosis of diabetes in children. . According to American Diabetes Association (ADA) guidelines, hemoglobin A1c <7.0% represents optimal control in non-pregnant diabetic patients. Different metrics may apply to specific patient populations.  Standards of Medical Care in Diabetes(ADA). .     CBG: Recent Labs  Lab 06-06-24 1150 06/06/2024 1219  GLUCAP 72 134*    Review of Systems:   Unable to assess   Past Medical History:  She,  has a past medical history of Allergy, Asthma, Cataract, COPD (chronic obstructive pulmonary disease) (HCC), Duodenal ulcer, Dysphagia, GERD (gastroesophageal reflux disease), Hiatal hernia, History of lung cancer, History of tobacco abuse, HTN (hypertension) (10/03/2014), Hyperlipemia, Macular degeneration, Osteoporosis, Pancytopenia due to antineoplastic chemotherapy (HCC), Peripheral vascular disease (HCC), Pneumonia, Trigeminal neuralgia, and Vitamin D  deficiency.   Surgical History:   Past Surgical History:  Procedure Laterality Date   ABDOMINAL HYSTERECTOMY     APPENDECTOMY     BALLOON DILATION N/A 02/27/2024   Procedure: BALLOON DILATION;  Surgeon: Albertus Gordy HERO, MD;  Location: [facility];  Service: Gastroenterology;  Laterality: N/A;   BONE BIOPSY  02/14/2024   Procedure: BIOPSY;  Surgeon: Charlanne Groom, MD;  Location: [facility];  Service: Gastroenterology;;   CATARACT EXTRACTION,  BILATERAL     COLONOSCOPY     CRANIOTOMY     for trigeminal neuralgia   ELBOW SURGERY Right    ENDARTERECTOMY Right 12/16/2014   Procedure: ENDARTERECTOMY CAROTID with Dacron patch;  Surgeon: Krystal JULIANNA Doing, MD;  Location: [facility];  Service: Vascular;  Laterality: Right;   ESOPHAGOGASTRODUODENOSCOPY  10/2008   ESOPHAGOGASTRODUODENOSCOPY N/A 02/14/2024   Procedure: EGD (ESOPHAGOGASTRODUODENOSCOPY);  Surgeon: Charlanne Groom, MD;  Location: [facility];  Service: Gastroenterology;  Laterality: N/A;   ESOPHAGOGASTRODUODENOSCOPY N/A 02/27/2024   Procedure: EGD (ESOPHAGOGASTRODUODENOSCOPY);  Surgeon: Albertus Gordy HERO, MD;  Location: [facility];  Service: Gastroenterology;  Laterality: N/A;   FINE NEEDLE ASPIRATION BIOPSY  07/25/2023   Procedure: FINE NEEDLE ASPIRATION BIOPSY;  Surgeon: Shelah Lamar RAMAN, MD;  Location: [facility];  Service: Pulmonary;;   HAND SURGERY Left    LUMBAR DISC SURGERY     SAVORY DILATION N/A 01/26/2024  Procedure: EGD, WITH DILATION USING SAVARY-GILLIARD DILATOR OVER GUIDEWIRE;  Surgeon: Stacia Glendia BRAVO, MD;  Location: [facility];  Service: Gastroenterology;  Laterality: N/A;  Needs fluro   TOTAL HIP ARTHROPLASTY Right    trigeminal neuralgia surgery     UPPER GASTROINTESTINAL ENDOSCOPY     VIDEO BRONCHOSCOPY WITH ENDOBRONCHIAL ULTRASOUND N/A 07/25/2023   Procedure: VIDEO BRONCHOSCOPY WITH ENDOBRONCHIAL ULTRASOUND;  Surgeon: Shelah Lamar RAMAN, MD;  Location: [facility];  Service: Pulmonary;  Laterality: N/A;   WRIST SURGERY Left      Social History:   reports that she quit smoking about 6 years ago. Her smoking use included cigarettes. She started smoking about 42 years ago. She has a 9 pack-year smoking history. She has never used smokeless tobacco. She reports that she does not currently use alcohol. She reports that she does not use drugs.   Family History:  Her family history includes AAA (abdominal aortic aneurysm) in her brother and mother; Colon cancer in an  other family member; Colon polyps in her mother; Diabetes in her maternal grandfather; Heart attack in her brother, father, and mother; Heart disease in her brother, father, maternal grandfather, maternal grandmother, and mother; Hyperlipidemia in her brother, father, and mother; Hypertension in her brother, father, and mother; Inflammatory bowel disease in her mother; Stroke in her mother; Varicose Veins in her daughter and mother. There is no history of Esophageal cancer, Stomach cancer, or Rectal cancer.   Allergies Allergies  Allergen Reactions   Tape Other (See Comments)    SKIN IS VERY SENSITIVE!!!   Celebrex [Celecoxib] Other (See Comments)    GI bleed   Decadron  [Dexamethasone ] Other (See Comments)    Stomach issues, history of GI bleed   Penicillins Other (See Comments)    WELTS Has patient had a PCN reaction causing immediate rash, facial/tongue/throat swelling, SOB or lightheadedness with hypotension: Yes Has patient had a PCN reaction causing severe rash involving mucus membranes or skin necrosis: No Has patient had a PCN reaction that required hospitalization: No Has patient had a PCN reaction occurring within the last 10 years: Yes If all of the above answers are NO, then may proceed with Cephalosporin use.   REACTION: whelps   Crestor [Rosuvastatin] Other (See Comments)    Made legs hurt   Doxycycline  Other (See Comments)    Stomach upset    Hydrocodone -Acetaminophen  Hives, Itching and Rash   Pravastatin Other (See Comments)    Muscle aches     Home Medications  Prior to Admission medications   Medication Sig Start Date End Date Taking? Authorizing Provider  albuterol  (PROVENTIL ) (2.5 MG/3ML) 0.083% nebulizer solution Take 3 mLs (2.5 mg total) by nebulization every 6 (six) hours as needed for wheezing or shortness of breath. 06/13/23   Laurice President, NP  albuterol  (VENTOLIN  HFA) 108 (90 Base) MCG/ACT inhaler Inhale 2 puffs into the lungs every 4 (four) hours as  needed for shortness of breath or wheezing (for asthma symptoms- use 15-20 minutes apart). 03/30/24   Dottie Norleen MANO II, MD  ARTIFICIAL TEARS PF 0.1-0.3 % SOLN Place 1 drop into both eyes 4 (four) times daily as needed (for dryness).    [provider]  ascorbic acid (VITAMIN C) 500 MG tablet Take 500 mg by mouth daily.    [provider]  Cholecalciferol  (VITAMIN D3) 125 MCG (5000 UT) CAPS Take 5,000 Units by mouth daily.    [provider]  feeding supplement (ENSURE ENLIVE / ENSURE PLUS) LIQD Take 237 mLs  by mouth 2 (two) times daily between meals. 03/15/24   Vann, Jessica U, DO  furosemide  (LASIX ) 20 MG tablet Take 1 tablet (20 mg total) by mouth daily. 05/15/24   Dottie Norleen MANO II, MD  ipratropium-albuterol  (DUONEB) 0.5-2.5 (3) MG/3ML SOLN Take 3 mLs by nebulization every 4 (four) hours as needed. Max:6 doses per day 05/11/23   Cranford, Tonya, NP  nystatin  (MYCOSTATIN ) 100000 UNIT/ML suspension Take 5 mLs (500,000 Units total) by mouth 4 (four) times daily. 03/09/24   Adah Wilbert LABOR, FNP  omeprazole  (PRILOSEC) 40 MG capsule Take 1 capsule (40 mg total) by mouth 2 (two) times daily before a meal. 01/04/24   May, Deanna J, NP  ondansetron  (ZOFRAN ) 8 MG tablet Take 1 tablet (8 mg total) by mouth every 8 (eight) hours as needed for nausea or vomiting. Starting 3 days after chemotherapy 02/20/24   Sherrod Sherrod, MD     Critical care time:   CRITICAL CARE Performed by: Rilynne Lonsway D. Harris   Total critical care time: 45 minutes  Critical care time was exclusive of separately billable procedures and treating other patients.  Critical care was necessary to treat or prevent imminent or life-threatening deterioration.  Critical care was time spent personally by me on the following activities: development of treatment plan with patient and/or surrogate as well as nursing, discussions with consultants, evaluation of patient's response to treatment, examination of patient,  obtaining history from patient or surrogate, ordering and performing treatments and interventions, ordering and review of laboratory studies, ordering and review of radiographic studies, pulse oximetry and re-evaluation of patient's condition.   Sahmya Arai D. Harris, NP-C [facility] Personal contact information can be found on Amion  If no contact or response made please call [phone], 1:36 PM

## 2024-06-18 NOTE — Progress Notes (Signed)
 Limited Code 1455 - 1500   This RN was at bedside while Dr. Claudene was placing central line. This RN witnessed sinus pause and then asystole on monitor at 1455. No pulse palpated. Code called, code cart was cracked. Dr. Claudene at bedside directing.   1456 Epi 1 mg  1457 Atropine  1 mg  1457 Calcium  1 g  1458 NaHCO3 50 g  1458 Defib   1500 Pulse check. Time of Death called.

## 2024-06-18 NOTE — Progress Notes (Signed)
 ED Pharmacy Antibiotic Sign Off An antibiotic consult was received from an ED provider for cefepime  and vancomycin  per pharmacy dosing for sepsis. A chart review was completed to assess appropriateness.  The following one time order(s) were placed per pharmacy consult:  cefepime  2000 mg x 1 dose vancomycin  1000 mg x 1 dose  Further antibiotic and/or antibiotic pharmacy consults should be ordered by the admitting provider if indicated.   Thank you for allowing pharmacy to be a part of this patient's care.   Dorn Buttner, PharmD, BCPS 23-Jun-2024 12:20 PM ED Clinical Pharmacist -  [phone]

## 2024-06-18 NOTE — Procedures (Signed)
 Intraosseous Needle Insertion Procedure Note    Date:2024-06-26  Time:2:15 PM   Provider Performing:Unice Vantassel   Procedure: Insertion Intraosseous (63319)  Indication(s) Medication administration  Consent Unable to obtain consent due to emergent nature of procedure.  Anesthesia Topical only with 1% lidocaine    Timeout Verified patient identification, verified procedure, site/side was marked, verified correct patient position, special equipment/implants available, medications/allergies/relevant history reviewed, required imaging and test results available.  Procedure Description Area of needle insertion was cleaned with chlorhexidine . Intraosseous needle was placed into the left tibia. Bone marrow was aspirated and site easily flushed. The needle was secured in place and dressing applied.  Complications/Tolerance None; patient tolerated the procedure well.  EBL Minimal

## 2024-06-18 NOTE — Procedures (Addendum)
 04-Jun-2024 CPR record  While placing central line with platelets running patient's BP continued to drop.  She went into asystole, procedure aborted, and epinephrine / bicarb/ calcium  pushed (limited code, no compressions offered), brief run of Vfib that was shocked and went into asystole again.  Patient declared deceased at 1500.  Family notified and will see patient shortly.  Condolences offered.  Rolan Sharps MD PCCM

## 2024-06-18 NOTE — ED Provider Notes (Signed)
 [place] EMERGENCY DEPARTMENT AT [facility] Provider Note   CSN: [id number] Arrival date & time: Jun 28, 2024  1148     Patient presents with: No chief complaint on file.   [name] is a 68 y.o. female.   68 year old female with prior medical history as detailed below presents an extremis.  She arrives from home with EMS transport.  Patient's family found her unresponsive this morning.  She was noted to be hypoglycemic and hypotensive and hypoxic with EMS.  On arrival the patient is having assisted ventilations with BVM by EMS.  She is apparently full code.  Additional history is unable to be obtained from the patient.  The history is provided by the patient and medical records.       Prior to Admission medications   Medication Sig Start Date End Date Taking? Authorizing Provider  albuterol  (PROVENTIL ) (2.5 MG/3ML) 0.083% nebulizer solution Take 3 mLs (2.5 mg total) by nebulization every 6 (six) hours as needed for wheezing or shortness of breath. 06/13/23   Laurice President, NP  albuterol  (VENTOLIN  HFA) 108 (90 Base) MCG/ACT inhaler Inhale 2 puffs into the lungs every 4 (four) hours as needed for shortness of breath or wheezing (for asthma symptoms- use 15-20 minutes apart). 03/30/24   Dottie Norleen MANO II, MD  ARTIFICIAL TEARS PF 0.1-0.3 % SOLN Place 1 drop into both eyes 4 (four) times daily as needed (for dryness).    [provider]  ascorbic acid (VITAMIN C) 500 MG tablet Take 500 mg by mouth daily.    [provider]  Cholecalciferol  (VITAMIN D3) 125 MCG (5000 UT) CAPS Take 5,000 Units by mouth daily.    [provider]  feeding supplement (ENSURE ENLIVE / ENSURE PLUS) LIQD Take 237 mLs by mouth 2 (two) times daily between meals. 03/15/24   Vann, Jessica U, DO  furosemide  (LASIX ) 20 MG tablet Take 1 tablet (20 mg total) by mouth daily. 05/15/24   Dottie Norleen MANO PONCE, MD  ipratropium-albuterol  (DUONEB) 0.5-2.5 (3) MG/3ML SOLN Take 3 mLs  by nebulization every 4 (four) hours as needed. Max:6 doses per day 05/11/23   Cranford, Tonya, NP  nystatin  (MYCOSTATIN ) 100000 UNIT/ML suspension Take 5 mLs (500,000 Units total) by mouth 4 (four) times daily. 03/09/24   Adah Wilbert DELENA, FNP  omeprazole  (PRILOSEC) 40 MG capsule Take 1 capsule (40 mg total) by mouth 2 (two) times daily before a meal. 01/04/24   May, Deanna J, NP  ondansetron  (ZOFRAN ) 8 MG tablet Take 1 tablet (8 mg total) by mouth every 8 (eight) hours as needed for nausea or vomiting. Starting 3 days after chemotherapy 02/20/24   Sherrod Sherrod, MD    Allergies: Tape, Celebrex [celecoxib], Decadron  [dexamethasone ], Penicillins, Crestor [rosuvastatin], Doxycycline , Hydrocodone -acetaminophen , and Pravastatin    Review of Systems  Unable to perform ROS: Acuity of condition    Updated Vital Signs There were no vitals taken for this visit.  Physical Exam Vitals and nursing note reviewed.  Constitutional:      General: She is not in acute distress.    Appearance: She is well-developed.     Comments: Unresponsive, nearly apneic with shallow breathing  Minimal, intermittent spontaneous movements of both upper extremities.  Patient is not following commands or responsive to verbal stimulation.  She does not localize painful stimulation.  Significant hypoxia, hypotension noted on monitors.  Patient is chronically ill in appearance.  She is quite cachectic.  HENT:     Head: Normocephalic and atraumatic.  Eyes:     Conjunctiva/sclera: Conjunctivae normal.  Cardiovascular:     Rate and Rhythm: Normal rate and regular rhythm.     Heart sounds: Normal heart sounds.  Pulmonary:     Effort: No respiratory distress.  Abdominal:     General: There is no distension.     Palpations: Abdomen is soft.     Tenderness: There is no abdominal tenderness.  Musculoskeletal:        General: No deformity.     Cervical back: Neck supple.  Skin:    General: Skin is warm and dry.      (all labs ordered are listed, but only abnormal results are displayed) Labs Reviewed  I-STAT VENOUS BLOOD GAS, ED - Abnormal; Notable for the following components:      Result Value   pH, Ven 7.064 (*)    pO2, Ven 59 (*)    Bicarbonate 17.1 (*)    TCO2 19 (*)    Acid-base deficit 13.0 (*)    Sodium 134 (*)    Calcium , Ion 0.95 (*)    HCT 27.0 (*)    Hemoglobin 9.2 (*)    All other components within normal limits  I-STAT CHEM 8, ED - Abnormal; Notable for the following components:   Sodium 133 (*)    BUN 25 (*)    Creatinine, Ser 1.40 (*)    Glucose, Bld 54 (*)    Calcium , Ion 0.86 (*)    TCO2 18 (*)    Hemoglobin 9.5 (*)    HCT 28.0 (*)    All other components within normal limits  I-STAT CG4 LACTIC ACID, ED - Abnormal; Notable for the following components:   Lactic Acid, Venous 2.6 (*)    All other components within normal limits  CULTURE, BLOOD (ROUTINE X 2)  CULTURE, BLOOD (ROUTINE X 2)  COMPREHENSIVE METABOLIC PANEL WITH GFR  CBC WITH DIFFERENTIAL/PLATELET  PROTIME-INR  URINALYSIS, W/ REFLEX TO CULTURE (INFECTION SUSPECTED)  CBG MONITORING, ED    EKG: EKG Interpretation Date/Time:  Saturday 06/13/24 11:49:06 EDT Ventricular Rate:  108 PR Interval:  142 QRS Duration:  63 QT Interval:  392 QTC Calculation: 526 R Axis:   93  Text Interpretation: Sinus tachycardia Ventricular premature complex Aberrant conduction of SV complex(es) Right axis deviation Low voltage, precordial leads Nonspecific T abnormalities, lateral leads ST elevation, consider inferior injury Prolonged QT interval Confirmed by Laurice Coy [phone]) on 06-13-2024 12:15:08 PM  Radiology: No results found.   Procedure Name: Intubation Date/Time: 06/13/24 3:24 PM  Performed by: Laurice Coy BROCKS, MDPre-anesthesia Checklist: Patient identified, Patient being monitored, Emergency Drugs available, Timeout performed and Suction available Oxygen  Delivery Method: Non-rebreather  mask Preoxygenation: Pre-oxygenation with 100% oxygen  Induction Type: Rapid sequence Ventilation: Mask ventilation without difficulty Laryngoscope Size: Glidescope and 4 Grade View: Grade I Tube size: 7.0 mm Number of attempts: 1 Placement Confirmation: ETT inserted through vocal cords under direct vision, CO2 detector and Breath sounds checked- equal and bilateral       Medications Ordered in the ED  dextrose  50 % solution (has no administration in time range)  naloxone  (NARCAN ) injection 1 mg (has no administration in time range)  fentaNYL  (SUBLIMAZE ) injection 25-50 mcg (has no administration in time range)  fentaNYL  in NS (43mcg/ml) infusion-PREMIX (has no administration in time range)  fentaNYL  (SUBLIMAZE ) bolus via infusion 25-100 mcg (has no administration in time range)  vasopressin  (PITRESSIN) 20 Units in 100 mL (0.2 unit/mL) infusion-*FOR SHOCK* (has no  administration in time range)  0.9 %  sodium chloride  infusion (has no administration in time range)  norepinephrine  (LEVOPHED ) 4mg  in (0.016 mg/mL) premix infusion (has no administration in time range)                                    Medical Decision Making Patient arrives critically ill, hypoxic, hypotensive, unresponsive, hypothermic.  Patient with hypoglycemia treated in the field.  Narcan  administered prior to intubation without improvement in mental status or vitals.  Resuscitation immediately begun with IV fluids, phenylephrine , Levophed .  Patient intubated without difficulty shortly after arrival.  Broad-spectrum antibiotics initiated.  Initial resuscitation continued.  Critical care made aware of case will evaluate for admission.  Amount and/or Complexity of Data Reviewed Labs: ordered. Radiology: ordered.  Risk Prescription drug management. Decision regarding hospitalization.   CRITICAL CARE Performed by: Maude JAYSON Galloway   Total critical care time: 45 minutes  Critical  care time was exclusive of separately billable procedures and treating other patients.  Critical care was necessary to treat or prevent imminent or life-threatening deterioration.  Critical care was time spent personally by me on the following activities: development of treatment plan with patient and/or surrogate as well as nursing, discussions with consultants, evaluation of patient's response to treatment, examination of patient, obtaining history from patient or surrogate, ordering and performing treatments and interventions, ordering and review of laboratory studies, ordering and review of radiographic studies, pulse oximetry and re-evaluation of patient's condition.      Final diagnoses:  Acute respiratory failure with hypoxia (HCC)  Hypoxia  Hypotension, unspecified hypotension type  Altered mental status, unspecified altered mental status type    ED Discharge Orders     None          Galloway Maude JAYSON, MD 06/08/2024 1528

## 2024-06-18 DEATH — deceased

## 2024-06-19 ENCOUNTER — Ambulatory Visit (HOSPITAL_BASED_OUTPATIENT_CLINIC_OR_DEPARTMENT_OTHER): Admitting: Family Medicine

## 2024-06-22 ENCOUNTER — Ambulatory Visit (HOSPITAL_BASED_OUTPATIENT_CLINIC_OR_DEPARTMENT_OTHER): Admitting: Family Medicine

## 2024-07-20 ENCOUNTER — Ambulatory Visit (HOSPITAL_BASED_OUTPATIENT_CLINIC_OR_DEPARTMENT_OTHER): Admitting: Family Medicine

## 2024-08-14 ENCOUNTER — Other Ambulatory Visit

## 2024-08-21 ENCOUNTER — Ambulatory Visit: Admitting: Internal Medicine
